# Patient Record
Sex: Male | Born: 1940
Health system: Southern US, Community
[De-identification: ages and names within clinical notes are randomized; demographics above are authoritative.]

## PROBLEM LIST (undated history)

## (undated) DIAGNOSIS — N3090 Cystitis, unspecified without hematuria: Secondary | ICD-10-CM

## (undated) DIAGNOSIS — N400 Enlarged prostate without lower urinary tract symptoms: Secondary | ICD-10-CM

## (undated) DIAGNOSIS — G459 Transient cerebral ischemic attack, unspecified: Secondary | ICD-10-CM

## (undated) DIAGNOSIS — D649 Anemia, unspecified: Secondary | ICD-10-CM

## (undated) DIAGNOSIS — R911 Solitary pulmonary nodule: Secondary | ICD-10-CM

## (undated) DIAGNOSIS — I255 Ischemic cardiomyopathy: Secondary | ICD-10-CM

## (undated) DIAGNOSIS — E785 Hyperlipidemia, unspecified: Secondary | ICD-10-CM

## (undated) DIAGNOSIS — I10 Essential (primary) hypertension: Secondary | ICD-10-CM

## (undated) DIAGNOSIS — E039 Hypothyroidism, unspecified: Secondary | ICD-10-CM

## (undated) DIAGNOSIS — E119 Type 2 diabetes mellitus without complications: Secondary | ICD-10-CM

## (undated) DIAGNOSIS — I2510 Atherosclerotic heart disease of native coronary artery without angina pectoris: Secondary | ICD-10-CM

## (undated) DIAGNOSIS — I447 Left bundle-branch block, unspecified: Secondary | ICD-10-CM

## (undated) DIAGNOSIS — I219 Acute myocardial infarction, unspecified: Secondary | ICD-10-CM

## (undated) DIAGNOSIS — I671 Cerebral aneurysm, nonruptured: Secondary | ICD-10-CM

## (undated) DIAGNOSIS — I480 Paroxysmal atrial fibrillation: Secondary | ICD-10-CM

## (undated) DIAGNOSIS — J189 Pneumonia, unspecified organism: Secondary | ICD-10-CM

## (undated) DIAGNOSIS — N183 Chronic kidney disease, stage 3 unspecified: Secondary | ICD-10-CM

## (undated) DIAGNOSIS — Z95810 Presence of automatic (implantable) cardiac defibrillator: Secondary | ICD-10-CM

## (undated) DIAGNOSIS — A4902 Methicillin resistant Staphylococcus aureus infection, unspecified site: Secondary | ICD-10-CM

## (undated) DIAGNOSIS — M1990 Unspecified osteoarthritis, unspecified site: Secondary | ICD-10-CM

## (undated) HISTORY — PX: CHOLECYSTECTOMY: SHX55

## (undated) HISTORY — DX: Morbid (severe) obesity due to excess calories: E66.01

## (undated) HISTORY — PX: LUNG SURGERY: SHX703

## (undated) HISTORY — DX: Solitary pulmonary nodule: R91.1

## (undated) HISTORY — DX: Chronic kidney disease, stage 3 (moderate): N18.3

## (undated) HISTORY — DX: Chronic kidney disease, stage 3 unspecified: N18.30

## (undated) HISTORY — DX: Acute myocardial infarction, unspecified: I21.9

## (undated) HISTORY — DX: Left bundle-branch block, unspecified: I44.7

## (undated) HISTORY — DX: Transient cerebral ischemic attack, unspecified: G45.9

## (undated) HISTORY — DX: Ischemic cardiomyopathy: I25.5

## (undated) HISTORY — DX: Hyperlipidemia, unspecified: E78.5

## (undated) HISTORY — DX: Type 2 diabetes mellitus without complications: E11.9

## (undated) HISTORY — PX: TOTAL KNEE ARTHROPLASTY: SHX125

## (undated) HISTORY — DX: Atherosclerotic heart disease of native coronary artery without angina pectoris: I25.10

## (undated) HISTORY — DX: Paroxysmal atrial fibrillation: I48.0

## (undated) HISTORY — DX: Benign prostatic hyperplasia without lower urinary tract symptoms: N40.0

## (undated) HISTORY — PX: VASECTOMY: SHX75

## (undated) HISTORY — DX: Essential (primary) hypertension: I10

---

## 2000-02-12 ENCOUNTER — Inpatient Hospital Stay (HOSPITAL_COMMUNITY): Admission: EM | Admit: 2000-02-12 | Discharge: 2000-02-19 | Payer: Self-pay | Admitting: Cardiology

## 2003-05-24 ENCOUNTER — Encounter: Payer: Self-pay | Admitting: Orthopedic Surgery

## 2003-05-28 ENCOUNTER — Inpatient Hospital Stay (HOSPITAL_COMMUNITY): Admission: RE | Admit: 2003-05-28 | Discharge: 2003-06-02 | Payer: Self-pay | Admitting: Orthopedic Surgery

## 2003-05-31 ENCOUNTER — Encounter: Payer: Self-pay | Admitting: Orthopedic Surgery

## 2004-01-30 ENCOUNTER — Inpatient Hospital Stay (HOSPITAL_COMMUNITY): Admission: RE | Admit: 2004-01-30 | Discharge: 2004-02-04 | Payer: Self-pay | Admitting: Orthopedic Surgery

## 2005-02-09 ENCOUNTER — Ambulatory Visit: Payer: Self-pay | Admitting: Cardiology

## 2006-04-06 ENCOUNTER — Ambulatory Visit: Payer: Self-pay | Admitting: Cardiology

## 2006-04-19 ENCOUNTER — Ambulatory Visit: Payer: Self-pay | Admitting: Cardiology

## 2007-12-02 ENCOUNTER — Ambulatory Visit: Payer: Self-pay | Admitting: Cardiology

## 2007-12-28 ENCOUNTER — Ambulatory Visit: Payer: Self-pay

## 2007-12-28 ENCOUNTER — Encounter: Payer: Self-pay | Admitting: Cardiology

## 2007-12-28 ENCOUNTER — Ambulatory Visit: Payer: Self-pay | Admitting: Cardiology

## 2007-12-28 LAB — CONVERTED CEMR LAB
Albumin: 3.8 g/dL (ref 3.5–5.2)
Basophils Absolute: 0.1 10*3/uL (ref 0.0–0.1)
Cholesterol: 192 mg/dL (ref 0–200)
Creatinine, Ser: 1.4 mg/dL (ref 0.4–1.5)
Direct LDL: 110 mg/dL
Eosinophils Absolute: 0.3 10*3/uL (ref 0.0–0.6)
GFR calc Af Amer: 65 mL/min
HCT: 36.6 % — ABNORMAL LOW (ref 39.0–52.0)
Hemoglobin: 12.6 g/dL — ABNORMAL LOW (ref 13.0–17.0)
Lymphocytes Relative: 18.2 % (ref 12.0–46.0)
MCHC: 34.4 g/dL (ref 30.0–36.0)
MCV: 91.9 fL (ref 78.0–100.0)
Monocytes Absolute: 0.8 10*3/uL — ABNORMAL HIGH (ref 0.2–0.7)
Neutro Abs: 4.6 10*3/uL (ref 1.4–7.7)
Neutrophils Relative %: 65.7 % (ref 43.0–77.0)
Potassium: 5.5 meq/L — ABNORMAL HIGH (ref 3.5–5.1)
RDW: 13.7 % (ref 11.5–14.6)
Sodium: 136 meq/L (ref 135–145)
TSH: 3.42 microintl units/mL (ref 0.35–5.50)
Total Bilirubin: 0.5 mg/dL (ref 0.3–1.2)
Total Protein: 6.5 g/dL (ref 6.0–8.3)

## 2008-01-03 ENCOUNTER — Ambulatory Visit: Payer: Self-pay | Admitting: Cardiology

## 2008-03-30 ENCOUNTER — Ambulatory Visit: Payer: Self-pay | Admitting: Cardiology

## 2008-05-14 ENCOUNTER — Ambulatory Visit: Payer: Self-pay | Admitting: Cardiology

## 2008-11-28 ENCOUNTER — Encounter: Payer: Self-pay | Admitting: Cardiology

## 2009-02-27 ENCOUNTER — Encounter: Payer: Self-pay | Admitting: Cardiology

## 2009-03-01 ENCOUNTER — Ambulatory Visit: Payer: Self-pay | Admitting: Cardiology

## 2009-03-12 ENCOUNTER — Encounter: Payer: Self-pay | Admitting: Cardiology

## 2009-03-12 ENCOUNTER — Ambulatory Visit: Payer: Self-pay | Admitting: Cardiology

## 2009-03-25 ENCOUNTER — Encounter: Payer: Self-pay | Admitting: Physician Assistant

## 2009-04-01 ENCOUNTER — Ambulatory Visit: Payer: Self-pay | Admitting: Cardiology

## 2009-04-03 ENCOUNTER — Ambulatory Visit: Payer: Self-pay | Admitting: Cardiology

## 2009-04-03 ENCOUNTER — Encounter: Payer: Self-pay | Admitting: Physician Assistant

## 2009-04-09 ENCOUNTER — Ambulatory Visit: Payer: Self-pay | Admitting: Cardiovascular Disease

## 2009-04-09 ENCOUNTER — Inpatient Hospital Stay (HOSPITAL_COMMUNITY): Admission: AD | Admit: 2009-04-09 | Discharge: 2009-04-10 | Payer: Self-pay | Admitting: Cardiovascular Disease

## 2009-04-10 ENCOUNTER — Encounter: Payer: Self-pay | Admitting: Cardiology

## 2009-04-18 ENCOUNTER — Encounter: Payer: Self-pay | Admitting: Cardiology

## 2009-05-06 ENCOUNTER — Encounter: Payer: Self-pay | Admitting: Physician Assistant

## 2009-05-06 ENCOUNTER — Ambulatory Visit: Payer: Self-pay | Admitting: Cardiology

## 2009-05-27 ENCOUNTER — Encounter: Payer: Self-pay | Admitting: Physician Assistant

## 2009-06-20 ENCOUNTER — Telehealth: Payer: Self-pay | Admitting: Physician Assistant

## 2009-06-24 ENCOUNTER — Telehealth: Payer: Self-pay | Admitting: Physician Assistant

## 2009-08-07 ENCOUNTER — Encounter: Admission: RE | Admit: 2009-08-07 | Discharge: 2009-08-07 | Payer: Self-pay | Admitting: Sports Medicine

## 2009-08-28 DIAGNOSIS — I5022 Chronic systolic (congestive) heart failure: Secondary | ICD-10-CM

## 2009-08-28 DIAGNOSIS — E663 Overweight: Secondary | ICD-10-CM | POA: Insufficient documentation

## 2009-08-28 DIAGNOSIS — E785 Hyperlipidemia, unspecified: Secondary | ICD-10-CM

## 2009-08-28 DIAGNOSIS — I2510 Atherosclerotic heart disease of native coronary artery without angina pectoris: Secondary | ICD-10-CM | POA: Insufficient documentation

## 2009-08-28 DIAGNOSIS — E875 Hyperkalemia: Secondary | ICD-10-CM | POA: Insufficient documentation

## 2009-08-28 DIAGNOSIS — I447 Left bundle-branch block, unspecified: Secondary | ICD-10-CM

## 2010-02-10 ENCOUNTER — Telehealth (INDEPENDENT_AMBULATORY_CARE_PROVIDER_SITE_OTHER): Payer: Self-pay | Admitting: *Deleted

## 2010-02-25 ENCOUNTER — Ambulatory Visit: Payer: Self-pay | Admitting: Cardiology

## 2010-03-04 ENCOUNTER — Encounter (INDEPENDENT_AMBULATORY_CARE_PROVIDER_SITE_OTHER): Payer: Self-pay | Admitting: *Deleted

## 2010-03-05 ENCOUNTER — Encounter: Admission: RE | Admit: 2010-03-05 | Discharge: 2010-03-05 | Payer: Self-pay | Admitting: Neurology

## 2010-03-11 ENCOUNTER — Encounter: Payer: Self-pay | Admitting: Cardiology

## 2010-03-11 ENCOUNTER — Ambulatory Visit: Payer: Self-pay | Admitting: Cardiology

## 2010-04-25 ENCOUNTER — Ambulatory Visit: Payer: Self-pay | Admitting: Cardiology

## 2010-04-25 ENCOUNTER — Encounter (INDEPENDENT_AMBULATORY_CARE_PROVIDER_SITE_OTHER): Payer: Self-pay | Admitting: *Deleted

## 2010-04-25 DIAGNOSIS — I119 Hypertensive heart disease without heart failure: Secondary | ICD-10-CM | POA: Insufficient documentation

## 2010-04-29 ENCOUNTER — Ambulatory Visit: Payer: Self-pay | Admitting: Cardiology

## 2010-04-30 ENCOUNTER — Encounter: Payer: Self-pay | Admitting: Cardiology

## 2010-05-15 ENCOUNTER — Encounter (INDEPENDENT_AMBULATORY_CARE_PROVIDER_SITE_OTHER): Payer: Self-pay | Admitting: *Deleted

## 2010-05-15 ENCOUNTER — Ambulatory Visit: Payer: Self-pay | Admitting: Physician Assistant

## 2010-05-15 DIAGNOSIS — E119 Type 2 diabetes mellitus without complications: Secondary | ICD-10-CM | POA: Insufficient documentation

## 2010-05-16 ENCOUNTER — Encounter (INDEPENDENT_AMBULATORY_CARE_PROVIDER_SITE_OTHER): Payer: Self-pay | Admitting: *Deleted

## 2010-05-20 ENCOUNTER — Encounter: Payer: Self-pay | Admitting: Physician Assistant

## 2010-05-21 ENCOUNTER — Inpatient Hospital Stay (HOSPITAL_BASED_OUTPATIENT_CLINIC_OR_DEPARTMENT_OTHER): Admission: RE | Admit: 2010-05-21 | Discharge: 2010-05-21 | Payer: Self-pay | Admitting: Cardiovascular Disease

## 2010-05-21 ENCOUNTER — Ambulatory Visit: Payer: Self-pay | Admitting: Cardiovascular Disease

## 2010-05-28 ENCOUNTER — Inpatient Hospital Stay (HOSPITAL_COMMUNITY): Admission: RE | Admit: 2010-05-28 | Discharge: 2010-05-29 | Payer: Self-pay | Admitting: Cardiovascular Disease

## 2010-05-28 ENCOUNTER — Ambulatory Visit: Payer: Self-pay | Admitting: Cardiovascular Disease

## 2010-06-06 ENCOUNTER — Encounter: Payer: Self-pay | Admitting: Physician Assistant

## 2010-06-06 ENCOUNTER — Ambulatory Visit: Payer: Self-pay | Admitting: Cardiology

## 2010-09-09 ENCOUNTER — Encounter: Admission: RE | Admit: 2010-09-09 | Discharge: 2010-09-09 | Payer: Self-pay | Admitting: Neurology

## 2010-12-18 NOTE — Assessment & Plan Note (Signed)
Summary: 3 mo fu per sept reminder-srs   Visit Type:  Follow-up Primary Provider:  Dr. [name]   History of Present Illness: 70 year old male presents for a followup visit. He denies any significant angina, and states that he does some dancing on a regular basis without major functional limitation. He reports NYHA class II dyspnea on exertion.  He indicates that his blood pressures typically better controlled than noted today. He states recent systolics have been in the 130 range. He reports compliance with medications. He has had no palpitations or syncope.  Current Medications (verified): 1)  Coreg 6.25 Mg Tabs (Carvedilol) .... Take 1 Tablet By Mouth Two Times A Day (Place On File) 2)  Felodipine 5 Mg Xr24h-Tab (Felodipine) .... Take 1 Tablet By Mouth Once A Day 3)  Nitrostat 0.4 Mg Subl (Nitroglycerin) .... Place One Tablet Under Tongue Every 5  Minutes As Needed For Severe Chest Pain, Not To Exceed 3 in 15 Min Time Frame. 4)  Plavix 75 Mg Tabs (Clopidogrel Bisulfate) .... Take 1 Tablet By Mouth Once A Day 5)  Aspir-Trin 325 Mg Tbec (Aspirin) .... Take 1 Tablet By Mouth Once A Day 6)  Flomax 0.4 Mg Caps (Tamsulosin Hcl) .... Take 1 Tablet By Mouth Once Every Other  Day 7)  Metformin Hcl 1000 Mg Tabs (Metformin Hcl) .... Take 1 Tablet By Mouth Two Times A Day 8)  Glyburide 5 Mg Tabs (Glyburide) .... Take 1 Tablet By Mouth Two Times A Day 9)  Tylenol Arthritis Pain 650 Mg Cr-Tabs (Acetaminophen) .... Take 2 Tablet By Mouth Two Times A Day 10)  Vitamin B-6 50 Mg Tabs (Pyridoxine Hcl) .... Take 1 Tablet By Mouth Once A Day  Allergies (verified): 1)  ! Codeine  Comments:  Nurse/Medical Assistant: The patient's medications and allergies were reviewed with the patient and were updated in the Medication and Allergy Lists. List reviewed.   Past History:  Past Medical History: Last updated: 02/24/2010 LBBB Mild renal insufficiency Type 2 diabetes mellitus CAD - DES RCA 5/10,  otherwise nonobstructive Hyperlipidemia TIA Ischemic myopathy Morbid obesity Mild mitral regurgitation Benign prostatic hypertrophy Paroxysmal atrial fibrillation  Social History: Last updated: 02/24/2010 Married  Tobacco Use - No Alcohol Use - no  Clinical Review Panels:  Echocardiogram Echocardiogram Conclusions:         1. The EF is 25%. There is dyssynergy of the septum. There is severe         hypokinesis of the inferior and posterior and anterior walls and the         apex. There is some motion of the lateral wall.         2. Moderate concentric left ventricular hypertrophy is observed.         3. The right ventricular global systolic function is normal.         4. There is mild mitral regurgitation.          5. The right ventricular systolic pressure is calculated at 15 mmHg.  (03/12/2009)  Cardiac Imaging Cardiac Cath Findings Severe segmental LV dysfunction with estimated LVEF 25-30%. There is severe hypokinesis of the anterolateral, apical, and inferior walls.  Right coronary dominance.  Complex calcified 95% mid lesion in RCA, stent placement with subsequent stenosis to 20% post procedure. Calcified 40% proximal lesion and LAD. Diffuse luminal irregularities to 30% in the mid circumflex. Calcified 80% mid lesion and right posterior descending branch. (04/09/2009)    Review of Systems  The patient denies anorexia,  weight gain, chest pain, syncope, peripheral edema, headaches, abdominal pain, melena, and hematochezia.         Otherwise reviewed and negative.  Vital Signs:  Patient profile:   70 year old male Height:      68 inches Weight:      228 pounds BMI:     34.79 Pulse rate:   65 / minute BP sitting:   153 / 77  (left arm) Cuff size:   large  Vitals Entered By: Carlye Grippe (February 25, 2010 3:02 PM)  Nutrition Counseling: Patient's BMI is greater than 25 and therefore counseled on weight management options.  Physical Exam  Additional Exam:   omale in no acute distress. HEENT: Conjunctiva and lids normal, oropharynx with moist mucosa. Neck: Supple, no elevated JVP. Lungs: Clear to auscultation, nonlabored. Cardiac: Regular rate and rhythm, paradoxically split S2, no S3. Abdomen: Obese, unable to palpate liver edge, bowel sounds present. Skin: Warm and dry, scattered abrasions and ecchymoses. Musculoskeletal: No kyphosis. Neuropsychiatric: Alert and oriented x3, affect appropriate.   EKG  Procedure date:  02/25/2010  Findings:      Normal sinus rhythm at 64 beats per minute with left bundle branch block.  Impression & Recommendations:  Problem # 1:  CAD, NATIVE VESSEL (ICD-414.01)  Symptomatically stable status post drug-eluting stent placement to the right coronary artery in May 2010. Otherwise nonobstructive disease noted. Patient continues on dual antiplatelet therapy.  His updated medication list for this problem includes:    Coreg 6.25 Mg Tabs (Carvedilol) .Marland Kitchen... Take 1 tablet by mouth two times a day (place on file)    Felodipine 5 Mg Xr24h-tab (Felodipine) .Marland Kitchen... Take 1 tablet by mouth once a day    Nitrostat 0.4 Mg Subl (Nitroglycerin) .Marland Kitchen... Place one tablet under tongue every 5  minutes as needed for severe chest pain, not to exceed 3 in 15 min time frame.    Plavix 75 Mg Tabs (Clopidogrel bisulfate) .Marland Kitchen... Take 1 tablet by mouth once a day    Aspir-trin 325 Mg Tbec (Aspirin) .Marland Kitchen... Take 1 tablet by mouth once a day  Orders: EKG w/ Interpretation (93000) 2-D Echocardiogram (2D Echo)  Problem # 2:  LBBB (ICD-426.3)  Chronic.  His updated medication list for this problem includes:    Coreg 6.25 Mg Tabs (Carvedilol) .Marland Kitchen... Take 1 tablet by mouth two times a day (place on file)    Felodipine 5 Mg Xr24h-tab (Felodipine) .Marland Kitchen... Take 1 tablet by mouth once a day    Nitrostat 0.4 Mg Subl (Nitroglycerin) .Marland Kitchen... Place one tablet under tongue every 5  minutes as needed for severe chest pain, not to exceed 3 in 15 min time  frame.    Plavix 75 Mg Tabs (Clopidogrel bisulfate) .Marland Kitchen... Take 1 tablet by mouth once a day    Aspir-trin 325 Mg Tbec (Aspirin) .Marland Kitchen... Take 1 tablet by mouth once a day  Problem # 3:  UNSPECIFIED SECONDARY CARDIOMYOPATHY (ICD-425.9)  Suspect mixed cardiomyopathy. Seems out of proportion to degree of coronary artery disease. He does have occasional lower extremity edema, and p.r.n. Lasix was prescribed. A followup echocardiogram will also be obtained to reassess systolic function.  His updated medication list for this problem includes:    Coreg 6.25 Mg Tabs (Carvedilol) .Marland Kitchen... Take 1 tablet by mouth two times a day (place on file)    Felodipine 5 Mg Xr24h-tab (Felodipine) .Marland Kitchen... Take 1 tablet by mouth once a day    Nitrostat 0.4 Mg Subl (Nitroglycerin) .Marland Kitchen... Place one tablet  under tongue every 5  minutes as needed for severe chest pain, not to exceed 3 in 15 min time frame.    Plavix 75 Mg Tabs (Clopidogrel bisulfate) .Marland Kitchen... Take 1 tablet by mouth once a day    Aspir-trin 325 Mg Tbec (Aspirin) .Marland Kitchen... Take 1 tablet by mouth once a day  Patient Instructions: 1)  Your physician has requested that you have an echocardiogram.  Echocardiography is a painless test that uses sound waves to create images of your heart. It provides your doctor with information about the size and shape of your heart and how well your heart's chambers and valves are working.  This procedure takes approximately one hour. There are no restrictions for this procedure. 2)  Your physician wants you to follow-up in: 3 months. You will receive a reminder letter in the mail one-two months in advance. If you don't receive a letter, please call our office to schedule the follow-up appointment. 3)  Take Lasix (furosemide) 20mg  by mouth as needed for swelling. Prescriptions: FUROSEMIDE 20 MG TABS (FUROSEMIDE) Take one tablet by mouth as needed  #30 x 2   Entered by:   Cyril Loosen, RN, BSN   Authorized by:   Loreli Slot, MD,  [facility]   Signed by:   Cyril Loosen, RN, BSN on 02/25/2010   Method used:   Electronically to        [facility]* (retail)       [address]       [place], [place]  [address]       Ph: [phone]       Fax: [phone]   RxID:   [id number]   Handout requested.

## 2010-12-18 NOTE — Assessment & Plan Note (Signed)
Summary: POST CATH & 1 MO FU   Visit Type:  Follow-up Primary Provider:  Dr. [name]   History of Present Illness: patient presents for post hospital followup.  Patient underwent recent elective diagnostic coronary angiography, for evaluation of persistent exertional dyspnea and a recent nuclear imaging study notable for a large fixed defect, but with no definite ischemia; EF 38%.  He was found to have severe in-stent restenosis in the RCA, and underwent successful balloon angioplasty, only. Dr. Clifton James was not able to expand the stent, secondary to severe calcification, and proceeded to reduce 2 areas of stenosis to approximately 40%. He also performed FFR of the proximal LAD, yielding moderate disease. Of note, ejection fraction estimated at 25%.  Clinically, the patient states that he returned to dancing the day after discharge. He does not report any associated chest discomfort. He seems to feel "a little better". He is tolerating his medications.  Preventive Screening-Counseling & Management  Alcohol-Tobacco     Smoking Status: quit     Year Quit: 1966  Current Medications (verified): 1)  Coreg 6.25 Mg Tabs (Carvedilol) .... Take 1 Tablet By Mouth Two Times A Day (Place On File) 2)  Felodipine 10 Mg Xr24h-Tab (Felodipine) .... Take 1 Tablet By Mouth Once A Day 3)  Nitrostat 0.4 Mg Subl (Nitroglycerin) .... Place One Tablet Under Tongue Every 5  Minutes As Needed For Severe Chest Pain, Not To Exceed 3 in 15 Min Time Frame. 4)  Plavix 75 Mg Tabs (Clopidogrel Bisulfate) .... Take 1 Tablet By Mouth Once A Day 5)  Aspir-Trin 325 Mg Tbec (Aspirin) .... Take 1 Tablet By Mouth Once A Day 6)  Metformin Hcl 1000 Mg Tabs (Metformin Hcl) .... Take 1 Tablet By Mouth Two Times A Day 7)  Glyburide 5 Mg Tabs (Glyburide) .... Take 1 Tablet By Mouth Two Times A Day 8)  Tylenol Arthritis Pain 650 Mg Cr-Tabs (Acetaminophen) .... Take 2 Tablet By Mouth Two Times A Day 9)  Vitamin B-6 50 Mg Tabs  (Pyridoxine Hcl) .... Take 1 Tablet By Mouth Once A Day 10)  Furosemide 20 Mg Tabs (Furosemide) .... Take One Tablet By Mouth As Needed 11)  Lisinopril 20 Mg Tabs (Lisinopril) .... Take 1 Tablet By Mouth Two Times A Day 12)  Neurpath-B 3-35-2 Mg Tabs (L-Methylfolate-B6-B12) .... Take 1 Tablet By Mouth Two Times A Day  Allergies (verified): 1)  ! Codeine  Comments:  Nurse/Medical Assistant: The patient's medication list and allergies were reviewed with the patient and were updated in the Medication and Allergy Lists.  Past History:  Past Medical History: LBBB Mild renal insufficiency Type 2 diabetes mellitus CAD - DES RCA 5/10, otherwise nonobstructive... PTCA RCA (ISR), 7/11 Hyperlipidemia TIA Ischemic myopathy... 25% by catheterization, 7/11; (35% by echo, 4/11) Morbid obesity Mild mitral regurgitation Benign prostatic hypertrophy Paroxysmal atrial fibrillation  Vital Signs:  Patient profile:   70 year old male Height:      68 inches Weight:      226 pounds Pulse rate:   67 / minute BP sitting:   142 / 74  (left arm) Cuff size:   large  Vitals Entered By: Carlye Grippe (June 06, 2010 11:02 AM)  Serial Vital Signs/Assessments:  Time      Position  BP       Pulse  Resp  Temp     By 11:09 AM            128/77   59  Carlye Grippe   Physical Exam  Additional Exam:  GEN: 70 yo male, sitting upright, in no distress HEENT: NCAT,PERRLA,EOMI NECK: palpable pulses, no bruits; no JVD; no TM LUNGS: CTA bilaterally HEART: RRR (S1S2); no significant murmurs; no rubs; no gallops ABD: soft, NT; intact BS EXT: intact femoral pulses, no bruits; minimally palpable DPs; trace edema SKIN: warm, dry MUSC: no obvious deformity NEURO: A/O (x3)     EKG  Procedure date:  06/06/2010  Findings:      sinus bradycardia at 54 bpm; chronic LBBB  Impression & Recommendations:  Problem # 1:  CAD, NATIVE VESSEL (ICD-414.01)  patient underwent recent successful  PTCA of severe in-stent restenosis of the RCA. He has moderate residual LAD disease. Estimated EF 25% by catheterization. He is not complaining of exertional symptoms, and seems to suggest some modest improvement since the procedure. We'll continue to monitor closely on current medication regimen, and schedule return visit in 3 months with Dr. Diona Browner. Of note, we'll schedule a followup 2-D echo prior to that visit, for reassessment of the LVF. An echo in April of this year suggested EF 35%. If his ejection fraction remains low, we many need to consider evaluation for ICD implantation.  Problem # 2:  HYPERLIPIDEMIA-MIXED (ICD-272.4)  patient is currently not on a statin. He reports prior intolerance to Lipitor and, most recently, Crestor. He does not know what the dose was, however. He has agreed to be rechallenged with Crestor, at a reduced dose of 5 mg daily. Will monitor closely and check followup lipid profile in 12 weeks. He will need aggressive management with target LDL of 70 or less, if feasible.  Problem # 3:  DM (ICD-250.00) Assessment: Comment Only  Problem # 4:  SYSTOLIC HEART FAILURE, CHRONIC (ICD-428.22)  euvolemic by history and physical examination.  Problem # 5:  LBBB (ICD-426.3) Assessment: Comment Only  Other Orders: EKG w/ Interpretation (93000)  Patient Instructions: 1)  Start Crestor 6mg  by mouth once at bedtime. 2)  Your physician recommends that you go to the [facility] for a FASTING lipid profile and liver function labs:  DO IN 12 WEEKS BEFORE OFFICE VISIT. 3)  Your physician has requested that you have an echocardiogram.  Echocardiography is a painless test that uses sound waves to create images of your heart. It provides your doctor with information about the size and shape of your heart and how well your heart's chambers and valves are working.  This procedure takes approximately one hour. There are no restrictions for this procedure. DO IN 18 WEEKS BEFORE OFFICE  VISIT.  4)  Your physician wants you to follow-up in: 3 months. You will receive a reminder letter in the mail one-two months in advance. If you don't receive a letter, please call our office to schedule the follow-up appointment. Prescriptions: CRESTOR 5 MG TABS (ROSUVASTATIN CALCIUM) Take one tablet by mouth at bedtime  #30 x 6   Entered by:   Cyril Loosen, RN, BSN   Authorized by:   Loreli Slot, MD, [facility]   Signed by:   Cyril Loosen, RN, BSN on 06/06/2010   Method used:   Electronically to        [facility]* (retail)       [address]       [place], [place]  [address]       Ph: [phone]       Fax: [phone]   RxID:  [id number]   Handout requested.

## 2010-12-18 NOTE — Assessment & Plan Note (Signed)
Summary: 3 WK F/U PER 6/10 OV W/SM-JM   Visit Type:  Follow-up Primary Provider:  Dr. [name]   History of Present Illness: The patient returns for scheduled followup.  He was recently referred for a nuclear imaging study, for evaluation of exertional chest discomfort and dyspnea. This revealed a large, fixed inferior defect with severe HK, but no definite ischemia: EF 38%.  Medications adjusted with increase in Felodipine to 10 mg daily, for better HTN control.  The patient reports no significant improvement in his symptoms. He continues to experience significant DOE, and states he has never had any chest pain.  Preventive Screening-Counseling & Management  Alcohol-Tobacco     Smoking Status: quit     Year Quit: 1966  Current Medications (verified): 1)  Coreg 6.25 Mg Tabs (Carvedilol) .... Take 1 Tablet By Mouth Two Times A Day (Place On File) 2)  Felodipine 10 Mg Xr24h-Tab (Felodipine) .... Take 1 Tablet By Mouth Once A Day 3)  Nitrostat 0.4 Mg Subl (Nitroglycerin) .... Place One Tablet Under Tongue Every 5  Minutes As Needed For Severe Chest Pain, Not To Exceed 3 in 15 Min Time Frame. 4)  Plavix 75 Mg Tabs (Clopidogrel Bisulfate) .... Take 1 Tablet By Mouth Once A Day 5)  Aspir-Trin 325 Mg Tbec (Aspirin) .... Take 1 Tablet By Mouth Once A Day 6)  Metformin Hcl 1000 Mg Tabs (Metformin Hcl) .... Take 1 Tablet By Mouth Two Times A Day 7)  Glyburide 5 Mg Tabs (Glyburide) .... Take 1 Tablet By Mouth Two Times A Day 8)  Tylenol Arthritis Pain 650 Mg Cr-Tabs (Acetaminophen) .... Take 2 Tablet By Mouth Two Times A Day 9)  Vitamin B-6 50 Mg Tabs (Pyridoxine Hcl) .... Take 1 Tablet By Mouth Once A Day 10)  Furosemide 20 Mg Tabs (Furosemide) .... Take One Tablet By Mouth As Needed 11)  Lisinopril 20 Mg Tabs (Lisinopril) .... Take 1 Tablet By Mouth Two Times A Day 12)  Neurpath-B 3-35-2 Mg Tabs (L-Methylfolate-B6-B12) .... Take 1 Tablet By Mouth Two Times A Day  Allergies  (verified): 1)  ! Codeine  Comments:  Nurse/Medical Assistant: The patient's medication list and allergies were reviewed with the patient and were updated in the Medication and Allergy Lists.  Past History:  Past Medical History: Last updated: 02/24/2010 LBBB Mild renal insufficiency Type 2 diabetes mellitus CAD - DES RCA 5/10, otherwise nonobstructive Hyperlipidemia TIA Ischemic myopathy Morbid obesity Mild mitral regurgitation Benign prostatic hypertrophy Paroxysmal atrial fibrillation  Past Surgical History: Last updated: 02/24/2010 Cholecystectomy Knee Arthroplasty-Total Two lung surgeries Vasectomy  Family History: Last updated: 02/24/2010 Premature cardiovascular disease  Social History: Last updated: 02/24/2010 Married  Tobacco Use - No Alcohol Use - no  Review of Systems       No fevers, chills, hemoptysis, dysphagia, melena, hematocheezia, hematuria, rash, claudication, orthopnea, pnd, pedal edema. no tachypalpitations. All other systems negative.   Vital Signs:  Patient profile:   70 year old male Height:      68 inches Weight:      226 pounds Pulse rate:   65 / minute BP sitting:   151 / 73  (left arm) Cuff size:   large  Vitals Entered By: Carlye Grippe (May 15, 2010 2:31 PM)  Physical Exam  Additional Exam:  GEN: 70 yo male, sitting upright, in no distress HEENT: NCAT,PERRLA,EOMI NECK: palpable pulses, no bruits; no JVD; no TM LUNGS: CTA bilaterally HEART: RRR (S1S2); no significant murmurs; no rubs; no gallops ABD: soft,  NT; intact BS EXT: intact femoral pulses, no bruits; minimally palpable DPs; trace edema SKIN: warm, dry MUSC: no obvious deformity NEURO: A/O (x3)     Impression & Recommendations:  Problem # 1:  CAD, NATIVE VESSEL (ICD-414.01)  Patient continues to experience significant DOE, with some associated chest tightness. He reports no appreciable improvement, following recent increase in Felodipine dose. Despite  absence of definite ischemia on recent Cardiolite, he is concerned that he may have a new "blockage". Therefore, we recommend proceeding with a diagnostic cardiac cath in the [facility] lab. The patient is in agreement, and the risks/benefits were reviewed, in conjunction with Dr Diona Browner. Will hold Metformin AM of cath.  Problem # 2:  ESSENTIAL HYPERTENSION, BENIGN (ICD-401.1)  Improved, following recent increase in Felodipine.  Problem # 3:  SYSTOLIC HEART FAILURE, CHRONIC (ICD-428.22)  Patient appears euvolemic; continue as needed Lasix.  Problem # 4:  HYPERLIPIDEMIA-MIXED (ICD-272.4) Assessment: Comment Only  Problem # 5:  LBBB (ICD-426.3)  chronic  Problem # 6:  DM (ICD-250.00)  will hold Metformin AM of cath.  Other Orders: Cardiac Catheterization (Cardiac Cath) T-Chest x-ray, 2 views (14782) T-Basic Metabolic Panel ([id number]) T-CBC No Diff ([id number]) T-PTT ([id number]) T-Protime, Auto ([id number])  Patient Instructions: 1)  Your physician recommends that you go to the [facility] for lab work and chest x-ray. Please do today, if possible. 2)  Your physician has requested that you have a cardiac catheterization.  Cardiac catheterization is used to diagnose and/or treat various heart conditions. Doctors may recommend this procedure for a number of different reasons. The most common reason is to evaluate chest pain. Chest pain can be a symptom of coronary artery disease (CAD), and cardiac catheterization can show whether plaque is narrowing or blocking your heart's arteries. This procedure is also used to evaluate the valves, as well as measure the blood flow and oxygen levels in different parts of your heart.  For further information please visit [email or web address]  Please follow instruction sheet, as given. Prescriptions: NITROSTAT 0.4 MG SUBL (NITROGLYCERIN) place one tablet under tongue every 5  minutes as needed for severe chest pain, not to exceed 3 in 15 min time  frame.  #25 x 2   Entered by:   Cyril Loosen, RN, BSN   Authorized by:   Nelida Meuse, PA-C   Signed by:   Cyril Loosen, RN, BSN on 05/15/2010   Method used:   Electronically to        [facility]* (retail)       [address]       [place], [place]  [address]       Ph: [phone]       Fax: [phone]   RxID:   [id number]

## 2010-12-18 NOTE — Letter (Signed)
Summary: Lexiscan or Dobutamine Pharmacist, community at [facility]  [address]   [place], [place] [address]   Phone: [phone]  Fax: [phone]      [facility] Cardiovascular Services  Lexiscan or Dobutamine Cardiolite Strss Test    [place]  Appointment Date:_  Appointment Time:_  Your doctor has ordered a CARDIOLITE STRESS TEST using a medication to stimulate exercise so that you will not have to walk on the treadmill to determine the condition of your heart during stress. If you take blood pressure medication, ask your doctor if you should take it the day of your test. You should not have anything to eat or drink at least 4 hours before your test is scheduled, and no caffeine, including decaffeinated tea and coffee, chocolate, and soft drinks for 24 hours before your test.  You will need to register at the Outpatient/Main Entrance at the hospital 15 minutes before your appointment time. It is a good idea to bring a copy of your order with you. They will direct you to the Diagnostic Imaging (Radiology) Department.  You will be asked to undress from the waist up and given a hospital gown to wear, so dress comfortably from the waist down for example: Sweat pants, shorts, or skirt Rubber soled lace up shoes (tennis shoes)  Plan on about three hours from registration to release from the hospital   You may take all of your medications with water the morning of your test.

## 2010-12-18 NOTE — Letter (Signed)
Summary: External Correspondence/ FAXED PRE-CATH ORDER  External Correspondence/ FAXED PRE-CATH ORDER   Imported By: Dorise Hiss 05/21/2010 14:04:11  _____________________________________________________________________  External Attachment:    Type:   Image     Comment:   External Document

## 2010-12-18 NOTE — Miscellaneous (Signed)
Summary: Orders Update  Clinical Lists Changes  Orders: Added new Test order of T-Basic Metabolic Panel [id number]) - Signed  Appended Document: Orders Update    Clinical Lists Changes  Orders: Added new Test order of T-CBC No Diff ([id number]) - Signed

## 2010-12-18 NOTE — Letter (Signed)
Summary: Generic Engineer, agricultural at [facility] [address]   [place], [place] [address]   Phone: [phone]  Fax: [phone]    03/04/2010  [name] [address] [place], [place]  [address]  Dear Mr. Goodloe,   Enclosed is a copy of your re-scheduled order for the 2 D Echo. I tried calling your home phone but the voice mail box was full. If this date and time are not suitable with you please give me a call and I will be happy to re-schedule.          Sincerely,   Zachary George Patient Care Coordinator [phone] ext. [phone]

## 2010-12-18 NOTE — Progress Notes (Signed)
Summary: PLEASE CALL/NEED CLARIFY COREG DOSE  Phone Note Call from Patient Call back at [phone]   Caller: Spouse Call For: nurse Summary of Call: wife called to get clarification on dose for carvedilol. Nurse informed her that from last visit that coreg 3.125mg  was increased to 6.25mg  two times a day.  Initial call taken by: Carlye Grippe,  February 10, 2010 9:46 AM

## 2010-12-18 NOTE — Letter (Signed)
Summary: Cardiac Cath Instructions - [facility]  [facility] [address]   [place], [place] [address]   Phone: [phone]  Fax: [phone]     05/15/2010 MRN: [id number]  MAKIH STEFANKO [address] [place], [place]  [address]  Dear Mr. Gerhart,   You are scheduled for a Cardiac Catheterization on Wednesday, May 21, 2010 with Dr. Clifton James  Please arrive to the 1st floor of the Heart and Vascular Center at [facility] at 9:30am on the day of your procedure. Please do not arrive before 6:30 a.m. Call the Heart and Vascular Center at [phone] if you are unable to make your appointmnet. The Code to get into the parking garage under the building is 1000. Take the elevators to the 1st floor. You must have someone to drive you home. Someone must be with you for the first 24 hours after you arrive home. Please wear clothes that are easy to get on and off and wear slip-on shoes. Do not eat or drink after midnight except water with your medications that morning. Bring all your medications and current insurance cards with you.  _X__ DO NOT take these medications before your procedure: Hold Metformin 24 hours before and 48 hours after cath. Hold Glyburide, Lasix (furosemide) and Lisinopril am of cath.  _X__ Make sure you take your aspirin and Plavix.  ___ You may take ALL of your medications with water that morning.  ___ DO NOT take ANY medications before your procedure.  ___ Pre-med instructions:  ________________________________________________________________________  The usual length of stay after your procedure is 2 to 3 hours. This can vary.  If you have any questions, please call the office at the number listed above.  Cyril Loosen, RN, BSN            Directions to the [facility]  Please Note : Park in [place] under the building not the parking deck.  From [employer]: Turn onto [address] Left onto [address] (1st stoplight) Right at the brick entrance to the hospital ([address]) Bear to the right and you will see a blue sign "Heart and Vascular Center" Parking garage is a sharp right'to get through the gate out in the code __1000_____. Once you park, take the elevator to the first floor. Please do not arrive before 0630am. The building will be dark before that time.   From [address] Turn onto [address] Turn left into the brick entrance to the hospital ([address] circle [address]) Bear to the right and you will see a blue sign "[facility] Center" Parking garage is a sharp right, to get thru the gate put in the code _1000___. Once you park, take the elevator to the first floor. Please do not arrive before 0630am. The building will be dark before that time

## 2010-12-18 NOTE — Assessment & Plan Note (Signed)
Summary: ROV- PER PATIENT REQUEST   Visit Type:  Follow-up Primary Provider:  Dr. [name]   History of Present Illness: 70 year old [name] presents for followup. He called to schedule a visit related to increasing episodes of chest pain consistent with angina. He states that he enjoys dancing on a regular basis, although has been able to get through only one song, then having to rest related to a feeling of "cold air" in his upper chest. This resolves after a few minutes of rest. This is been progressive over the last few months. He has not used any nitroglycerin.  I reviewed his recent echocardiogram, outlined below. LVEF is abnormal, however has increased overall.  He reports compliance with his medications.  Preventive Screening-Counseling & Management  Alcohol-Tobacco     Smoking Status: quit     Year Quit: 1966  Current Medications (verified): 1)  Coreg 6.25 Mg Tabs (Carvedilol) .... Take 1 Tablet By Mouth Two Times A Day (Place On File) 2)  Felodipine 5 Mg Xr24h-Tab (Felodipine) .... Take 1 Tablet By Mouth Once A Day 3)  Nitrostat 0.4 Mg Subl (Nitroglycerin) .... Place One Tablet Under Tongue Every 5  Minutes As Needed For Severe Chest Pain, Not To Exceed 3 in 15 Min Time Frame. 4)  Plavix 75 Mg Tabs (Clopidogrel Bisulfate) .... Take 1 Tablet By Mouth Once A Day 5)  Aspir-Trin 325 Mg Tbec (Aspirin) .... Take 1 Tablet By Mouth Once A Day 6)  Flomax 0.4 Mg Caps (Tamsulosin Hcl) .... Take 1 Tablet By Mouth Once Every Other  Day 7)  Metformin Hcl 1000 Mg Tabs (Metformin Hcl) .... Take 1 Tablet By Mouth Two Times A Day 8)  Glyburide 5 Mg Tabs (Glyburide) .... Take 1 Tablet By Mouth Two Times A Day 9)  Tylenol Arthritis Pain 650 Mg Cr-Tabs (Acetaminophen) .... Take 2 Tablet By Mouth Two Times A Day 10)  Vitamin B-6 50 Mg Tabs (Pyridoxine Hcl) .... Take 1 Tablet By Mouth Once A Day 11)  Furosemide 20 Mg Tabs (Furosemide) .... Take One Tablet By Mouth As Needed  Allergies: 1)  !  Codeine  Comments:  Nurse/Medical Assistant: The patient's medications were reviewed with the patient and were updated in the Medication List.  Past History:  Past Medical History: Last updated: 02/24/2010 LBBB Mild renal insufficiency Type 2 diabetes mellitus CAD - DES RCA 5/10, otherwise nonobstructive Hyperlipidemia TIA Ischemic myopathy Morbid obesity Mild mitral regurgitation Benign prostatic hypertrophy Paroxysmal atrial fibrillation  Social History: Last updated: 02/24/2010 Married  Tobacco Use - No Alcohol Use - no  Social History: Smoking Status:  quit  Review of Systems       The patient complains of chest pain and dyspnea on exertion.  The patient denies anorexia, fever, weight loss, syncope, peripheral edema, prolonged cough, melena, and hematochezia.         Otherwise reviewed and negative except as outlined.  Vital Signs:  Patient profile:   70 year old [name] Height:      Tyler inches Weight:      224.4 pounds O2 Sat:      98 % Pulse rate:   71 / minute BP sitting:   172 / 74  (left arm)  Vitals Entered By: Youlanda Mighty RN (April 25, 2010 1:43 PM) Is Patient Diabetic? Yes Did you bring your meter with you today? No   Physical Exam  Additional Exam:  Obese [name] in no acute distress. HEENT: Conjunctiva and lids normal, oropharynx with moist mucosa.  Neck: Supple, no elevated JVP. Lungs: Clear to auscultation, nonlabored. Cardiac: Regular rate and rhythm, paradoxically split S2, no S3. Abdomen: Obese, unable to palpate liver edge, bowel sounds present. Skin: Warm and dry, scattered abrasions and ecchymoses. Musculoskeletal: No kyphosis. Neuropsychiatric: Alert and oriented x3, affect appropriate.   Echocardiogram  Procedure date:  03/11/2010  Findings:      Mild left ventricular dilatation with moderate LVH, LVEF 35%, aortic valve sclerosis.  Impression & Recommendations:  Problem # 1:  CAD, NATIVE VESSEL (ICD-414.01)  History of  drug-eluting stent to the right coronary artery last May, otherwise nonobstructive. Patient is describing symptoms consistent with progressive angina. Plan to continue medical regimen, with an increase in felodipine to 10 mg daily for additional antianginal benefit and blood pressure control, and refer him for a followup Lexiscan Cardiolite. May need to consider a repeat angiogram if residual ischemia is noted. Followup visit scheduled for the next 3 weeks.  His updated medication list for this problem includes:    Coreg 6.25 Mg Tabs (Carvedilol) .Marland Kitchen... Take 1 tablet by mouth two times a day (place on file)    Felodipine 10 Mg Xr24h-tab (Felodipine) .Marland Kitchen... Take 1 tablet by mouth once a day    Nitrostat 0.4 Mg Subl (Nitroglycerin) .Marland Kitchen... Place one tablet under tongue every 5  minutes as needed for severe chest pain, not to exceed 3 in 15 min time frame.    Plavix 75 Mg Tabs (Clopidogrel bisulfate) .Marland Kitchen... Take 1 tablet by mouth once a day    Aspir-trin 325 Mg Tbec (Aspirin) .Marland Kitchen... Take 1 tablet by mouth once a day  Problem # 2:  LBBB (ICD-426.3)  Chronic.  His updated medication list for this problem includes:    Coreg 6.25 Mg Tabs (Carvedilol) .Marland Kitchen... Take 1 tablet by mouth two times a day (place on file)    Felodipine 10 Mg Xr24h-tab (Felodipine) .Marland Kitchen... Take 1 tablet by mouth once a day    Nitrostat 0.4 Mg Subl (Nitroglycerin) .Marland Kitchen... Place one tablet under tongue every 5  minutes as needed for severe chest pain, not to exceed 3 in 15 min time frame.    Plavix 75 Mg Tabs (Clopidogrel bisulfate) .Marland Kitchen... Take 1 tablet by mouth once a day    Aspir-trin 325 Mg Tbec (Aspirin) .Marland Kitchen... Take 1 tablet by mouth once a day  Problem # 3:  SYSTOLIC HEART FAILURE, CHRONIC (ICD-428.22)  Recent echocardiogram shows improvement in LVEF to 35%.  His updated medication list for this problem includes:    Coreg 6.25 Mg Tabs (Carvedilol) .Marland Kitchen... Take 1 tablet by mouth two times a day (place on file)    Felodipine 10 Mg  Xr24h-tab (Felodipine) .Marland Kitchen... Take 1 tablet by mouth once a day    Nitrostat 0.4 Mg Subl (Nitroglycerin) .Marland Kitchen... Place one tablet under tongue every 5  minutes as needed for severe chest pain, not to exceed 3 in 15 min time frame.    Plavix 75 Mg Tabs (Clopidogrel bisulfate) .Marland Kitchen... Take 1 tablet by mouth once a day    Aspir-trin 325 Mg Tbec (Aspirin) .Marland Kitchen... Take 1 tablet by mouth once a day    Furosemide 20 Mg Tabs (Furosemide) .Marland Kitchen... Take one tablet by mouth as needed  Problem # 4:  ESSENTIAL HYPERTENSION, BENIGN (ICD-401.1)  Blood pressure elevated. Felodipine is being increased as noted above.  His updated medication list for this problem includes:    Coreg 6.25 Mg Tabs (Carvedilol) .Marland Kitchen... Take 1 tablet by mouth two times a day (place on  file)    Felodipine 10 Mg Xr24h-tab (Felodipine) .Marland Kitchen... Take 1 tablet by mouth once a day    Aspir-trin 325 Mg Tbec (Aspirin) .Marland Kitchen... Take 1 tablet by mouth once a day    Furosemide 20 Mg Tabs (Furosemide) .Marland Kitchen... Take one tablet by mouth as needed  Patient Instructions: 1)  Follow up appt with our PA on Thursday, May 15, 2010 at 2pm. 2)  Increase Felodipine to 10mg  by mouth once daily. You may take 2 of your 5 mg tablets until gone and then get new prescription filled for 10mg  tablets. 3)  Your physician has requested that you have an Best boy.  For further information please visit [email or web address]  Please follow instruction sheet, as given. Prescriptions: FELODIPINE 10 MG XR24H-TAB (FELODIPINE) Take 1 tablet by mouth once a day  #30 x 6   Entered by:   Cyril Loosen, RN, BSN   Authorized by:   Loreli Slot, MD, [facility]   Signed by:   Cyril Loosen, RN, BSN on 04/25/2010   Method used:   Electronically to        [facility]* (retail)       [address]       [place], [place]  [address]       Ph: [phone]       Fax: [phone]   RxID:   [id number]

## 2010-12-19 ENCOUNTER — Other Ambulatory Visit: Payer: Self-pay | Admitting: Neurology

## 2010-12-19 DIAGNOSIS — I671 Cerebral aneurysm, nonruptured: Secondary | ICD-10-CM

## 2011-02-01 LAB — BASIC METABOLIC PANEL
BUN: 14 mg/dL (ref 6–23)
BUN: 22 mg/dL (ref 6–23)
CO2: 24 mEq/L (ref 19–32)
Calcium: 8.6 mg/dL (ref 8.4–10.5)
Calcium: 8.9 mg/dL (ref 8.4–10.5)
Chloride: 104 mEq/L (ref 96–112)
Creatinine, Ser: 0.96 mg/dL (ref 0.4–1.5)
GFR calc Af Amer: >60 mL/min (ref >60)
GFR calc non Af Amer: 56 mL/min — ABNORMAL LOW (ref >60)
GFR calc non Af Amer: >60 mL/min (ref >60)
Glucose, Bld: 181 mg/dL — ABNORMAL HIGH (ref 70–99)
Glucose, Bld: 204 mg/dL — ABNORMAL HIGH (ref 70–99)
Potassium: 4.8 mEq/L (ref 3.5–5.1)
Sodium: 133 mEq/L — ABNORMAL LOW (ref 135–145)
Sodium: 135 mEq/L (ref 135–145)

## 2011-02-01 LAB — CBC
HCT: 27.1 % — ABNORMAL LOW (ref 39.0–52.0)
Hemoglobin: 9 g/dL — ABNORMAL LOW (ref 13.0–17.0)
Hemoglobin: 9.2 g/dL — ABNORMAL LOW (ref 13.0–17.0)
MCH: 26.6 pg (ref 26.0–34.0)
MCHC: 32.5 g/dL (ref 30.0–36.0)
MCHC: 33 g/dL (ref 30.0–36.0)
MCV: 80.6 fL (ref 78.0–100.0)
Platelets: 222 10*3/uL (ref 150–400)
RBC: 3.37 MIL/uL — ABNORMAL LOW (ref 4.22–5.81)
RBC: 3.49 MIL/uL — ABNORMAL LOW (ref 4.22–5.81)
RDW: 18 % — ABNORMAL HIGH (ref 11.5–15.5)
WBC: 8.4 10*3/uL (ref 4.0–10.5)

## 2011-02-01 LAB — PROTIME-INR
INR: 1.08 (ref 0.00–1.49)
Prothrombin Time: 13.9 seconds (ref 11.6–15.2)

## 2011-02-01 LAB — GLUCOSE, CAPILLARY
Glucose-Capillary: 132 mg/dL — ABNORMAL HIGH (ref 70–99)
Glucose-Capillary: 174 mg/dL — ABNORMAL HIGH (ref 70–99)
Glucose-Capillary: 195 mg/dL — ABNORMAL HIGH (ref 70–99)

## 2011-02-11 ENCOUNTER — Encounter: Payer: Self-pay | Admitting: *Deleted

## 2011-02-12 ENCOUNTER — Encounter: Payer: Self-pay | Admitting: Cardiology

## 2011-02-13 ENCOUNTER — Encounter: Payer: Self-pay | Admitting: Cardiology

## 2011-02-13 ENCOUNTER — Ambulatory Visit (INDEPENDENT_AMBULATORY_CARE_PROVIDER_SITE_OTHER): Payer: Medicare HMO | Admitting: Cardiology

## 2011-02-13 VITALS — BP 136/75 | HR 56 | Ht 68.0 in | Wt 222.0 lb

## 2011-02-13 DIAGNOSIS — I429 Cardiomyopathy, unspecified: Secondary | ICD-10-CM

## 2011-02-13 DIAGNOSIS — I10 Essential (primary) hypertension: Secondary | ICD-10-CM

## 2011-02-13 DIAGNOSIS — I2510 Atherosclerotic heart disease of native coronary artery without angina pectoris: Secondary | ICD-10-CM

## 2011-02-13 DIAGNOSIS — I447 Left bundle-branch block, unspecified: Secondary | ICD-10-CM

## 2011-02-13 DIAGNOSIS — E785 Hyperlipidemia, unspecified: Secondary | ICD-10-CM

## 2011-02-13 NOTE — Assessment & Plan Note (Signed)
Patient no longer on Crestor, described intolerance. Will continue to work with him.

## 2011-02-13 NOTE — Progress Notes (Signed)
Clinical Summary [name] is a 70 y.o.male presenting for routine followup. He was seen in July 2011 following coronary intervention with PTCA of an in-stent RCA stenosis. LVEF was in the range of 25-35% around that time, and a followup echocardiogram was suggested.  Symptomatically [name] states that he is feeling well, continues to dance regularly, no active angina or shortness of breath beyond NYHA class II. No palpitations or syncope.  He is now being followed by an endocrinologist in [place]. On insulin at this point.   Allergies  Allergen Reactions  . Codeine     REACTION: blisters    Current outpatient prescriptions:acetaminophen (TYLENOL) 650 MG CR tablet, Take 2 by mouth twice daily  , Disp: , Rfl: ;  aspirin 325 MG tablet, Take 325 mg by mouth daily.  , Disp: , Rfl: ;  carvedilol (COREG) 6.25 MG tablet, Take 6.25 mg by mouth 2 (two) times daily with a meal.  , Disp: , Rfl: ;  clopidogrel (PLAVIX) 75 MG tablet, Take 75 mg by mouth daily.  , Disp: , Rfl:  felodipine (PLENDIL) 10 MG 24 hr tablet, Take 10 mg by mouth daily.  , Disp: , Rfl: ;  ferrous sulfate 324 (65 FE) MG TBEC, Take by mouth daily.  , Disp: , Rfl: ;  furosemide (LASIX) 20 MG tablet, Take one by mouth daily  , Disp: , Rfl: ;  insulin glargine (LANTUS) 100 UNIT/ML injection, Inject into the skin at bedtime.  , Disp: , Rfl: ;  insulin regular (HUMULIN R,NOVOLIN R) 100 UNIT/ML injection, Inject into the skin as directed.  , Disp: , Rfl:  L-Methylfolate-B6-B12 (NEURPATH-B) 3-35-2 MG TABS, Take one by mouth twice daily  , Disp: , Rfl: ;  metFORMIN (GLUCOPHAGE) 1000 MG tablet, Take 500 mg by mouth daily with breakfast. , Disp: , Rfl: ;  nitroGLYCERIN (NITROSTAT) 0.4 MG SL tablet, Place 0.4 mg under the tongue every 5 (five) minutes as needed.  , Disp: , Rfl: ;  pyridoxine (B-6) 50 MG tablet, Take 50 mg by mouth daily.  , Disp: , Rfl:  Zinc 50 MG CAPS, Take by mouth daily.  , Disp: , Rfl: ;  DISCONTD: glyBURIDE (DIABETA) 5  MG tablet, Take one by mouth twice daily  , Disp: , Rfl: ;  DISCONTD: lisinopril (PRINIVIL,ZESTRIL) 20 MG tablet, Take one by mouth twice daily  , Disp: , Rfl: ;  DISCONTD: rosuvastatin (CRESTOR) 5 MG tablet, Take 5 mg by mouth daily.  , Disp: , Rfl:   Past Medical History  Diagnosis Date  . Mild renal insufficiency   . Type 2 diabetes mellitus   . Coronary atherosclerosis of native coronary artery     DES RCA 5/10, PTCA RCA (ISR), 7/11  . Hyperlipidemia   . TIA (transient ischemic attack)   . Cardiomyopathy, ischemic     LVEF 25-35%  . Morbid obesity   . Mitral regurgitation     Mild  . BPH (benign prostatic hypertrophy)   . LBBB (left bundle branch block)   . Paroxysmal atrial fibrillation     Social History [name] reports that he quit smoking about 46 years ago. His smoking use included Cigarettes. He has never used smokeless tobacco. [name] reports that he does not drink alcohol.  Review of Systems No fevers, chills, or unusual weight change. No chest pain, progressive shortness of breath, cough, hemoptysis, or wheezing. No palpitations, dizziness, or syncope. No dysphasia or odynophagia. Stable appetite with no abdominal pain, melena,  or hematochezia. No orthopnea, PND, or lower extremity edema. No focal motor weakness, memory problems, or speech deficits. Otherwise systems reviewed and negative except as already outlined.   Physical Examination Filed Vitals:   02/13/11 1323  BP: 136/75  Pulse: 56  GEN: 71 yo male, sitting upright, in no distress HEENT: NCAT,PERRLA,EOMI NECK: palpable pulses, no bruits; no JVD; no TM LUNGS: CTA bilaterally HEART: RRR (S1S2); no significant murmurs; no rubs; no gallops ABD: soft, NT; intact BS EXT: intact femoral pulses, no bruits; minimally palpable DPs; trace edema SKIN: warm, dry MUSC: no obvious deformity NEURO: A/O (x3)    ECG Sinus bradycardia at 55 beats per minute with left bundle branch block, old.   Problem List  and Plan

## 2011-02-13 NOTE — Assessment & Plan Note (Signed)
No changes made to present regimen. 

## 2011-02-13 NOTE — Assessment & Plan Note (Signed)
Plan to followup with a 2-D echocardiogram to reassess LVEF. May need to consider electrophysiology referral to discuss defibrillator. This has been discussed with him.

## 2011-02-13 NOTE — Patient Instructions (Signed)
Your physician wants you to follow-up in: 6 months. You will receive a reminder letter in the mail one-two months in advance. If you don't receive a letter, please call our office to schedule the follow-up appointment. Your physician has requested that you have an echocardiogram. Echocardiography is a painless test that uses sound waves to create images of your heart. It provides your doctor with information about the size and shape of your heart and how well your heart's chambers and valves are working. This procedure takes approximately one hour. There are no restrictions for this procedure. If the results of your test are normal or stable, you will receive a letter. If they are abnormal, the nurse will contact you by phone. Your physician recommends that you continue on your current medications as directed. Please refer to the Current Medication list given to you today.

## 2011-02-13 NOTE — Assessment & Plan Note (Signed)
Old

## 2011-02-13 NOTE — Assessment & Plan Note (Signed)
No active angina. Continue medical therapy.

## 2011-02-17 ENCOUNTER — Other Ambulatory Visit: Payer: Self-pay | Admitting: Cardiology

## 2011-02-18 ENCOUNTER — Other Ambulatory Visit: Payer: Self-pay | Admitting: Cardiology

## 2011-02-18 DIAGNOSIS — I429 Cardiomyopathy, unspecified: Secondary | ICD-10-CM

## 2011-02-18 MED ORDER — FUROSEMIDE 20 MG PO TABS: 20.0000 mg | ORAL_TABLET | Freq: Every day | ORAL | Status: DC

## 2011-02-19 NOTE — Telephone Encounter (Signed)
[place] pt. 

## 2011-02-19 NOTE — Telephone Encounter (Signed)
Carvedilol and lasix rx sent to [facility] yesterday.

## 2011-02-23 ENCOUNTER — Other Ambulatory Visit: Payer: Self-pay | Admitting: Cardiovascular Disease

## 2011-02-24 LAB — GLUCOSE, CAPILLARY

## 2011-02-24 LAB — BASIC METABOLIC PANEL
BUN: 19 mg/dL (ref 6–23)
CO2: 24 mEq/L (ref 19–32)
CO2: 24 mEq/L (ref 19–32)
Chloride: 104 mEq/L (ref 96–112)
Chloride: 106 mEq/L (ref 96–112)
GFR calc Af Amer: >60 mL/min (ref >60)
GFR calc non Af Amer: 56 mL/min — ABNORMAL LOW (ref >60)
Glucose, Bld: 174 mg/dL — ABNORMAL HIGH (ref 70–99)
Potassium: 4.7 mEq/L (ref 3.5–5.1)
Potassium: 5 mEq/L (ref 3.5–5.1)
Sodium: 138 mEq/L (ref 135–145)

## 2011-02-24 LAB — CBC
HCT: 32.1 % — ABNORMAL LOW (ref 39.0–52.0)
HCT: 32.8 % — ABNORMAL LOW (ref 39.0–52.0)
HCT: 33.5 % — ABNORMAL LOW (ref 39.0–52.0)
Hemoglobin: 11.1 g/dL — ABNORMAL LOW (ref 13.0–17.0)
Hemoglobin: 11.4 g/dL — ABNORMAL LOW (ref 13.0–17.0)
MCHC: 33.7 g/dL (ref 30.0–36.0)
MCV: 90.2 fL (ref 78.0–100.0)
MCV: 90.4 fL (ref 78.0–100.0)
MCV: 91.5 fL (ref 78.0–100.0)
RBC: 3.51 MIL/uL — ABNORMAL LOW (ref 4.22–5.81)
RDW: 16.8 % — ABNORMAL HIGH (ref 11.5–15.5)
RDW: 16.8 % — ABNORMAL HIGH (ref 11.5–15.5)
WBC: 7.1 10*3/uL (ref 4.0–10.5)

## 2011-03-02 ENCOUNTER — Ambulatory Visit
Admission: RE | Admit: 2011-03-02 | Discharge: 2011-03-02 | Disposition: A | Discharge status: Home / Self Care | Payer: Medicare HMO | Source: Ambulatory Visit | Attending: Neurology | Admitting: Neurology

## 2011-03-02 DIAGNOSIS — I671 Cerebral aneurysm, nonruptured: Secondary | ICD-10-CM

## 2011-03-11 ENCOUNTER — Ambulatory Visit
Admission: RE | Admit: 2011-03-11 | Discharge: 2011-03-11 | Disposition: A | Discharge status: Home / Self Care | Payer: Medicare HMO | Source: Ambulatory Visit | Attending: Neurology | Admitting: Neurology

## 2011-03-11 ENCOUNTER — Other Ambulatory Visit: Payer: Self-pay | Admitting: Neurology

## 2011-03-11 DIAGNOSIS — M79609 Pain in unspecified limb: Secondary | ICD-10-CM

## 2011-03-24 ENCOUNTER — Ambulatory Visit (INDEPENDENT_AMBULATORY_CARE_PROVIDER_SITE_OTHER): Payer: Medicare HMO | Admitting: Internal Medicine

## 2011-03-24 ENCOUNTER — Encounter: Payer: Self-pay | Admitting: Internal Medicine

## 2011-03-24 DIAGNOSIS — I5022 Chronic systolic (congestive) heart failure: Secondary | ICD-10-CM

## 2011-03-24 DIAGNOSIS — I429 Cardiomyopathy, unspecified: Secondary | ICD-10-CM

## 2011-03-24 DIAGNOSIS — I2510 Atherosclerotic heart disease of native coronary artery without angina pectoris: Secondary | ICD-10-CM

## 2011-03-24 NOTE — Progress Notes (Signed)
HPI  [name] is a 70 y.o. male seen at the request of Dr. Diona Browner for consideration of ICD implantation.  His history of ischemic and nonischemic heart disease. In 2010 he underwent atherectomy of an RCA is heavily calcified and in July 2011 PTCA of an in-stent RCA stenosis. LVEF was in the range of 25-35% around that time, and a followup echocardiogram was suggested. This is apparently repeated recently; he continued to show left ventricular dysfunction and he is referred for an ICD. Symptomatically [name] states that he is feeling well, continues to dance a number of days a week. He says he can climb a flight of stairs without significant shortness of breath. He denies nocturnal dyspnea orthopnea. He's had peripheral edema only of late. He is copious in his fluid intake. His diet has markedly changed over the last couple years with elimination of red meat and to decrease his salt.  He's had no significant palpitations. He has no syncope. He does have a history of atrial fibrillation with a prior TIA. He is not on Coumadin as he takes aspirin and Plavix.  The other issue is that he has an aneurysm which has been followed very carefully by Dr. Graciella Freer. There has been concern about the role of an MRI and is precluding of his having serial MR.  Past Medical History  Diagnosis Date  . Mild renal insufficiency   . Type 2 diabetes mellitus   . Coronary atherosclerosis of native coronary artery     DES RCA 5/10, PTCA RCA (ISR), 7/11  . Hyperlipidemia   . TIA (transient ischemic attack)   . Cardiomyopathy, ischemic     LVEF 25-35%  . Morbid obesity   . Mitral regurgitation     Mild  . BPH (benign prostatic hypertrophy)   . LBBB (left bundle branch block)   . Paroxysmal atrial fibrillation     Past Surgical History  Procedure Date  . Cholecystectomy   . Total knee arthroplasty   . Vasectomy   . Lung surgery     Current Outpatient Prescriptions  Medication Sig Dispense Refill    . acetaminophen (TYLENOL) 650 MG CR tablet Take 2 by mouth twice daily        . aspirin 325 MG tablet Take 325 mg by mouth daily.        . carvedilol (COREG) 6.25 MG tablet TAKE 1 TABLET BY MOUTH TWICE DAILY EMERGENCY REFILL FAXED DR  60 tablet  6  . clopidogrel (PLAVIX) 75 MG tablet 1 tab po qd  30 tablet  6  . felodipine (PLENDIL) 10 MG 24 hr tablet Take 10 mg by mouth daily.        . furosemide (LASIX) 20 MG tablet Take 1 tablet (20 mg total) by mouth daily.  30 tablet  6  . insulin glargine (LANTUS) 100 UNIT/ML injection Inject into the skin at bedtime.        . insulin regular (HUMULIN R,NOVOLIN R) 100 UNIT/ML injection Inject into the skin as directed.        Marland Kitchen L-Methylfolate-B6-B12 (NEURPATH-B) 3-35-2 MG TABS Take one by mouth twice daily        . metFORMIN (GLUCOPHAGE) 1000 MG tablet Take 500 mg by mouth daily with breakfast.       . nitroGLYCERIN (NITROSTAT) 0.4 MG SL tablet Place 0.4 mg under the tongue every 5 (five) minutes as needed.        . pyridoxine (B-6) 50 MG tablet Take 50 mg  by mouth daily.        . Zinc 50 MG CAPS Take by mouth daily.        . ferrous sulfate 324 (65 FE) MG TBEC Take by mouth daily.          Allergies  Allergen Reactions  . Codeine     REACTION: blisters    Review of Systems negative except from HPI and PMH  Physical Exam Well developed and well nourished old Caucasian male modestly obese appearing his stated age in no acute distress HENT normal E scleral and icterus clear Neck Supple JVP flat; carotids brisk and full Clear to ausculation Regular rate and rhythm, no murmurs gallops or rub Soft with active bowel sounds No clubbing cyanosis and edema Alert and oriented, grossly normal motor and sensory function Skin Warm and Dry  ECG sinus rhythm with left bundle branch block  Assessment and  Plan

## 2011-03-24 NOTE — Assessment & Plan Note (Signed)
We'll continue medical therapy. The patient clearly meets indications for ICD implantation based on the rreported ejection fraction; however, the issues related to MRI scanning of his brain makes the issue much less clear. Given the fact that his symptoms are class I and he has left bundle branch block CRT-D would be appropriate based on the MADIT-CRT protocol. I will review these issues with Dr. Sandria Manly.

## 2011-03-24 NOTE — Assessment & Plan Note (Signed)
Stable on current medications in the past congestive heart failure was precipitated by atrial fibrillation apparently

## 2011-03-24 NOTE — Assessment & Plan Note (Signed)
Stable; continue aspirin and Plavix

## 2011-03-30 ENCOUNTER — Encounter: Payer: Self-pay | Admitting: Internal Medicine

## 2011-03-31 NOTE — Discharge Summary (Signed)
NAME:  [name], [name] NO.:  [id number]   MEDICAL RECORD NO.:  [id number]          PATIENT TYPE:  INP   LOCATION:  2505                         FACILITY:  [facility]   PHYSICIAN:  [name], MDDATE OF BIRTH:  1941/10/11   DATE OF ADMISSION:  04/09/2009  DATE OF DISCHARGE:  04/10/2009                               DISCHARGE SUMMARY   PRIMARY CARDIOLOGIST:  Tyler Sidle, MD   PRIMARY CARE [name]:  Tyler Peri, MD   DISCHARGE DIAGNOSIS:  Coronary artery disease, status post successful  rotational atherectomy, and Xience drug-eluting stent placement this  admission.   SECONDARY DIAGNOSES:  1. Ischemic cardiomyopathy/chronic systolic congestive heart failure.  2. Diabetes mellitus.  3. Hyperlipidemia.  4. History of transient ischemic attack.  5. Paroxysmal atrial fibrillation, previously on Coumadin.  6. Benign prostatic hypertrophy.  7. Osteoarthritis, status post bilateral knee replacement.  8. Morbid obesity.  9. History of mild renal insufficiency.  10.Mild mitral regurgitation.  11.Chronic left bundle-branch block.   ALLERGIES:  CODEINE.   PROCEDURES:  Left heart cardiac catheterization with successful  percutaneous coronary intervention and stenting of the right coronary  artery with placement of a 3.5 x 28 mm Xience V drug-eluting stent.   HISTORY OF PRESENT ILLNESS:  A 70 year old Caucasian male with prior  history of nonobstructive coronary artery disease and nonischemic  cardiomyopathy.  He recently underwent 2-D echocardiogram performed as  an outpatient showing an EF of 25% with severe hypokinesis of the  inferior, posterior, and anterior apical walls.  He was subsequently  followed up with pharmacologic stress test revealing moderate inferior  ischemia and EF of 36%.  He then was seen in our [facility] on May 19,  with plans arranged for cardiac catheterization.   HOSPITAL COURSE:  The patient presented to the Cardiac cath lab  on Apr 09, 2009.  He underwent a left heart cardiac catheterization revealing  95% heavily calcified stenosis in the right coronary artery, but  otherwise nonobstructive disease and an EF of 25-30% with severe  hypokinesis of the anterolateral, apical, and inferior walls.  The RCA  was felt to be the culprit vessel and this was successfully treated  initially with rotational atherectomy, followed by drug-eluting stent  placement with a 3.5 x 28 mm Xience V stent.  In the setting of heavy  calcification, there was mild residual stenosis following stenting due  to an inability to fully expand the stent.  As such, at the time of  discharge, we have opted to sent the patient home on Plavix 75 mg 2  tablets daily for 1 month, and then 75 mg daily.  This decision was  discussed with the patient's primary cardiologist, Dr. Diona [name] and  decision has also been made to discontinue Coumadin at this time.   [name] has had no recurrent symptoms following this procedure.  He  has been ambulating without difficulty.  He will be discharged home  today in good condition.   DISCHARGE LABORATORY DATA:  Hemoglobin 10.8, hematocrit 32.1, WBC 7.1,  platelets 212.  Sodium 133, potassium 4.7, chloride  104, CO2 of 24, BUN  13, creatinine 1.03, glucose 248, calcium 8.7.   DISPOSITION:  The patient will be discharged home today in good  condition.   FOLLOWUP PLANS AND APPOINTMENTS:  We have arranged for follow up with  Dr. Diona [name] on June 21, at 2:15 p.m.  He will follow up with Dr. Clelia [name]  as previously scheduled.   DISCHARGE MEDICATIONS:  1. Aspirin 325 mg daily.  2. Plavix 75 mg 2 tablets daily x1 month and 1 tablet daily.  3. Metformin 1000 mg b.i.d. to be resumed on Apr 12, 2009.  4. Lisinopril 20 mg daily.  5. Felodipine 10 mg daily.  6. Flomax 0.4 mg every other day.  7. Coreg 3.125 mg b.i.d.  8. Glyburide 5 mg b.i.d.  9. Multivitamin every day.  10.B6 vitamin 50 mg daily.  11.Tylenol  Arthritis 2 caps daily.  12.Crestor 10 mg nightly.  13.Nitroglycerin 0.4 mg sublingual p.r.n. chest pain.   The patient has been advised to discontinue Coumadin therapy.   OUTSTANDING LABORATORY STUDIES:  None.   DURATION OF DISCHARGE ENCOUNTER:  Forty five minutes including physician  time.      [name], ANP      Tyler Carrow, MD  Electronically Signed    CB/MEDQ  D:  04/10/2009  T:  04/10/2009  Job:  [id number]   cc:   Tyler Peri, MD

## 2011-03-31 NOTE — Assessment & Plan Note (Signed)
[facility]                          [facility] OFFICE NOTE   JUANA, MONTINI                        MRN:          [id number]  DATE:12/02/2007                            DOB:          Jul 16, 1941    CARDIOLOGIST:  Jonelle Sidle, MD   PRIMARY CARE PHYSICIAN:  [name], MD   REASON FOR FOLLOW UP:  High blood pressure.   HISTORY OF PRESENT ILLNESS:  [name] is a 70 year old male patient  with a history of nonischemic cardiomyopathy, mild to moderate  nonobstructive coronary artery disease, paroxysmal atrial fibrillation  on chronic Coumadin therapy and diabetes mellitus who presents to the  office today after an approximate 22-month hiatus for follow-up.  He was  last seen Apr 05, 2006.  At that time he was set up for follow-up  echocardiogram which revealed improved ejection fraction of 40-45%.   The patient tells me over last 5-6 months he noticed his blood pressure  increasing.  He has never had a problem with his blood pressure being  over 130 systolically and 70s to 80s diastolic.  He has followed with  Dr. Sherryll Burger a couple of times since Dr. Magnus Ivan death.  [name] is the  newest medication as to his medical regimen.  His blood pressure is not  improved.  His last set of blood work performed was in October 2008.  This revealed a creatinine of 1.2 and potassium of 5.  Of note, his  triglycerides were 325, total cholesterol 182, HDL 30, LDL 97.   The patient denies any history of headaches, blurry vision, monocular  blindness, unilateral weakness, difficulty with speech or facial  drooping.  He denies any chest pain or shortness of breath.  Denies any  exertional chest heaviness or tightness.  Denies any syncope.  Denies  orthopnea, PND or pedal edema.  Denies any palpitations.   CURRENT MEDICATIONS:  1. Glyburide 5 mg b.i.d.  2. Coumadin as directed.  3. Glucophage 1000 mg b.i.d.  4. Metoprolol 50 mg daily.  5. Lisinopril  40 mg b.i.d.  6. Aldactone 25 mg daily.  7. Felodipine 5 mg b.i.d.  8. Celebrex 200 mg daily.  9. Tekturna 150 mg daily.  10.Multivitamin.  11.Flomax 0.4 mg on odd days.   PHYSICAL EXAMINATION:  GENERAL:  Well-nourished, well-developed man.  VITAL SIGNS:  Blood pressure 157/87, pulse 70, respirations 15.  HEENT:  Normal.  NECK:  Without JVD.  LYMPH:  Without lymphadenopathy.  CARDIAC:  Normal S1, S2.  Regular rate and rhythm without murmurs.  LUNGS:  Clear to auscultation bilaterally.  ABDOMEN:  Soft, nontender, normoactive bowel sounds.  No organomegaly.  EXTREMITIES:  Without edema.  NEUROLOGIC:  He is alert and oriented x3.  Cranial nerves II-XII grossly  intact.   STUDIES:  Electrocardiogram reveals sinus rhythm with a heart rate of  72, borderline first degree AV block with a PR interval of 206  milliseconds, left bundle branch block.   IMPRESSION:  1. Hypertension, uncontrolled.  2. Nonischemic cardiomyopathy with a previous EF of 40-45% by      echocardiogram  June 2007.  3. Mild to moderate nonobstructive coronary artery disease by      catheterization 2001.  4. Diabetes mellitus type 2.  5. Untreated dyslipidemia.  6. Osteoarthritis.  7. Benign prostatic hypertrophy.  8. History of TIA in 1999.  9. Paroxysmal atrial fibrillation.  Chronic Coumadin therapy -      followed by Dr. Sherryll Burger.   PLAN:  Mr. Armstrong presents to the office today for follow-up.  He has  had increasing blood pressures over the last several months and has had  some medication adjustments without success.  He is fairly asymptomatic  with this.  He is on multiple medications for blood pressure control.  He is also on multiple medications that could affect renal function.  At  this point time we plan to:  1. Discontinue his metoprolol.  2. Initiate carvedilol 3.25 mg b.i.d. to see if we can achieve better      blood pressure control with a nonselective beta blocker.  3. Check a C-met, CBC and  TSH.  4. Obtain renal arterial ultrasound to rule out renal artery stenosis.      This will have to be done in our [place] office.  We will also      obtain a 2-D echocardiogram at that time to reassess his LV      function.  5. We will obtain fasting lipids to follow up on his dyslipidemia.  We      will discuss with him at his follow up the possibility of statin      therapy.  He would certainly benefit from this given his diabetes      and history of proven vascular disease.  His goal LDL will be <70.  6. We will have him return in the next four-weeks to follow up on the      above testing and reassess his blood pressure.  The patient was      also seen by Dr. Diona Browner today.      Tyler Newcomer, PA-C  Electronically Signed      Jonelle Sidle, MD  Electronically Signed   SW/MedQ  DD: 12/02/2007  DT: 12/02/2007  Job #: [id number]   cc:   Jonelle Sidle, MD  [name], MD

## 2011-03-31 NOTE — Assessment & Plan Note (Signed)
[facility]                          [facility]ICE NOTE   JAYQUON, THEILER                        MRN:          [id number]  DATE:03/30/2008                            DOB:          08/08/1941    CARDIOLOGIST:  Jonelle Sidle, MD   PRIMARY CARE PHYSICIAN:  [name], MD   REASON FOR VISIT:  Three month followup.   HISTORY OF PRESENT ILLNESS:  Mr. Federici is a pleasant 70 year old male  patient with a history of nonischemic cardiomyopathy and mild to  moderate nonobstructive coronary disease, paroxysmal atrial fibrillation  on chronic Coumadin therapy that is followed by Dr. Sherryll Burger who presents to  the office today for a routine followup.  When last seen by Dr. Diona Browner  his felodipine was increased to 10 mg a day.  His Aldactone was  decreased to 12.5 mg a day secondary to recent mild hyperkalemia of 5.5.  He was also started on low dose simvastatin at 20 mg a day secondary to  hyperlipidemia with an LDL of 110.   The patient presents to the office today for followup.  Overall he is  doing well.  He denies chest pain, shortness breath, syncope or near  syncope, palpitations, orthopnea, PND, pedal edema.  He does bring in  his blood pressures.  His blood pressure readings at home average out to  about 135/60.  The patient does note significant myalgias to simvastatin  and had discontinued this after about a week's therapy.   CURRENT MEDICATIONS:  1. Glyburide 10 mg b.i.d.  2. Coumadin as directed.  3. Glucophage 1 gram b.i.d.  4. Carvedilol 3.125 mg b.i.d.  5. Lisinopril 40 mg b.i.d.  6. Aldactone 25 mg half tablet daily.  7. Felodipine 10 mg daily.  8. Flomax 0.4 mg every other day.  9. Celebrex 200 mg daily.  10.Tekturna 300 mg daily.  11.Multivitamin daily.  12.Vitamin B6.  13.NitroQuick.  14.Vicodin p.r.n.   PHYSICAL EXAM:  GENERAL:  He is a well-nourished, well-developed male.  VITAL SIGNS:  Blood pressure is 140/76,  pulse 68, weight 234 pounds.  HEENT:  Is normal.  NECK:  Without JVD.  CARDIOVASCULAR:  Normal S1, S2, regular rate and rhythm without murmur.  LUNGS:  Clear to auscultation bilaterally.  ABDOMEN:  Soft, nontender.  EXTREMITIES:  Without edema.  NEUROLOGIC:  He is alert and oriented x3.  Cranial nerves II-XII grossly  intact.   IMPRESSION:  1. Hypertension.  2. Dyslipidemia.      a.     Unable to tolerate simvastatin secondary to myalgias.  3. Nonischemic cardiomyopathy with an EF (ejection fraction) of 45%.  4. Mild to moderate nonobstructive coronary disease by catheterization      2001.  5. Diabetes mellitus type 2.  6. Paroxysmal atrial fibrillation.      a.     Chronic Coumadin therapy - followed by Dr. Sherryll Burger.  7. History of TIA (transient ischemic attack) in 1999.   PLAN:  1. Mr. Lafond presents for followup.  His blood pressure overall looks      to be  fairly stable.  His goal is actually less than 130/80 with      his diabetes.  He could tolerate an increase in his carvedilol.  I      have increased his carvedilol to 6.25 mg b.i.d.  2. We will set the patient up for a BMET today to follow up on his      renal function and potassium given the recent adjustment in his      spironolactone therapy.  3. The patient had problems with simvastatin.  I have asked him to try      a special schedule with his simvastatin.  He can try to take the      medication for 3 days in a row and then take 1-2 days off and then      restart his schedule.  If he has continued problems with myalgias      with this schedule I have offered to change him to pravastatin 20      mg a day.  In the event that he continues on medical therapy we      will check followup lipid and LFTs in 12 weeks' time.  4. The patient will be brought back in routine followup in the next 6      months or sooner p.r.n.      Tereso Newcomer, PA-C  Electronically Signed      Jonelle Sidle, MD  Electronically  Signed   SW/MedQ  DD: 03/30/2008  DT: 03/30/2008  Job #: [id number]   cc:   [name], MD

## 2011-03-31 NOTE — Assessment & Plan Note (Signed)
[facility]                          [facility]ICE NOTE   [name], [name]                        MRN:          [id number]  DATE:04/03/2009                            DOB:          Apr 30, 1941    PRIMARY CARDIOLOGIST:  Dr. Simona Huh.   REASON FOR VISIT:  [name] is a 70 year old male with nonischemic  cardiomyopathy, who was recently referred for a routine follow-up 2-D  echocardiogram.  His ejection fraction was previously assessed at 45%,  back in February 2009.  His recent echocardiogram, however, suggested a  significant decline, with an estimated EF of 25% with severe hypokinesis  of the inferior/posterior and anterior apical walls.  There was also  mild mitral regurgitation.   Given this finding, the patient was referred for a pharmacologic stress  test, reviewed by Dr. Myrtis Ser, indicating evidence of moderate inferior  ischemia; EF 36%.   Given this result, the patient was asked to come in for early follow-up  to consider a repeat cardiac catheterization.  His previous study in  2001 indicated moderate nonobstructive CAD with an ejection fraction of  21%.   Clinically, [name] states that he continues to do fairly well, since  having lost some 50 pounds over the past several years.  He is able to  dance 3 to 4 times a week with his wife, and has been doing so the past  6 months.  He denies any orthopnea, PND, lower extremity edema.   With respect to any chest discomfort, however, he does refer to  occasional anterior chest tightness, for example when walking uphill.  However, this promptly relieves with rest.  He denies any such symptoms  at rest.  He also denied any chest pain during his recent stress test.   ALLERGIES:  CODEINE.   CURRENT MEDICATIONS:  1. Coumadin 5 mg, as directed.  2. Carvedilol 3.125 b.i.d.  3. Felodipine 10 daily.  4. Flomax 0.4 every other day.  5. Metformin 1000 b.i.d.  6. Lisinopril 20  b.i.d.  7. Glyburide 5 b.i.d.   PAST MEDICAL HISTORY:  1. Nonobstructive coronary artery disease.      a.     Cardiac catheterization, April 2001.  2. Type 2 diabetes mellitus.  3. Dyslipidemia.  4. History of TIA in 1999.  5. Paroxysmal atrial fibrillation.      a.     Chronic Coumadin, followed by Dr. [name].  6. BPH.  7. Osteoarthritis.      a.     Status post bilateral knee replacement.  8. Morbid obesity.  9. History of hyperkalemia, on Aldactone.  10.History of mild renal insufficiency.  11.Mild mitral regurgitation.  12.Chronic left bundle branch block.   SOCIAL HISTORY:  The patient is married.  He denies tobacco smoking.   FAMILY HISTORY:  Noncontributory for premature coronary artery disease.   REVIEW OF SYSTEMS:  As cited per HPI, all remaining systems negative.   PHYSICAL EXAMINATION:  Blood pressure 140/78, pulse 68, regular, weight  230.  GENERAL:  70 year old male, moderately obese, sitting upright, in no  distress.  HEENT:  Normocephalic, atraumatic.  NECK:  Palpable bilateral carotid pulses without bruits; no JVD at 90  degrees.  LUNGS:  Diminished breath sounds,  particularly on the left, but without  crackles or wheezes.  HEART:  Regular rate and rhythm,  with diminished heart sounds.  No  definite murmur.  ABDOMEN:  Protuberant, nontender, with intact bowel sounds.  EXTREMITIES:  Palpable bilateral femoral pulses without bruits.  Intact  peripheral pulses with no significant edema.  SKIN:  Warm and dry.  MUSCULOSKELETAL:  No gross abnormality.  NEUROLOGIC:  No focal deficit.   IMPRESSION:  [name] is a 70 year old male with history of severe  nonischemic cardiomyopathy, with previous catheterization in 2001  indicating moderate, nonobstructive coronary artery disease with a  calculated ejection fraction of 21%.  He has demonstrated improved left  ventricular function over the past few years by follow-up  echocardiograms, on medical therapy.   However, a recent surveillance 2-D  echocardiogram indicated a significant drop with an estimated EF of 25%,  with associated severe hypokinesis of the inferior/posterior and  anterior apical walls.  Given this finding, he was referred for a  pharmacologic stress test to exclude significant progression in coronary  artery disease.  This was performed as a pharmacologic stress test  employing thallium, and suggested moderate inferior ischemia with  calculated ejection fraction of 36%.   The patient presents with history suggestive of NYHA class I- II heart  failure.  However, he does indicate occasional anterior chest tightness  with moderate exertion, relieved by rest.  He denies any symptoms at  rest.   PLAN:  Following review of the recent study results with Dr. Diona Browner,  and in the context of the patient's reported symptoms of the recent  past, recommendation is to proceed with a diagnostic coronary angiogram  to definitively exclude significant CAD progression.  [name] has  multiple risk factors, including type 2 diabetes mellitus, and certainly  may have had progression of his disease since his last study in 2001.  [name] is in agreement with this recommendation and is willing to  proceed.  The risks/benefits were discussed in conjunction with Dr.  Diona Browner.   Accordingly, we will try to schedule this within the next several days,  following temporary cessation of Coumadin.  In the interim, the patient  will be maintained on whole dose aspirin.  Metformin will be placed on  hold at time of scheduled procedure.      Rozell Searing, PA-C  Electronically Signed      Jonelle Sidle, MD  Electronically Signed   GS/MedQ  DD: 04/03/2009  DT: 04/03/2009  Job #: [id number]   cc:   [name], MD

## 2011-03-31 NOTE — Assessment & Plan Note (Signed)
[facility]                          [facility] OFFICE NOTE   FREMAN, LAPAGE                        MRN:          [id number]  DATE:03/01/2009                            DOB:          09-Feb-1941    PRIMARY CARE PHYSICIAN:  [name], MD   REASON FOR VISIT:  Routine followup.   HISTORY OF PRESENT ILLNESS:  Mr. Chuba was seen last year in May.  He  has a history of a nonischemic cardiomyopathy with an ejection fraction  of 45% with underlying mild-to-moderate nonobstructive coronary  atherosclerosis documented in 2001.  He does not report any problems  with angina and has an NYHA class II dyspnea on exertion.  He states he  has been dancing 3 days a week and enjoying this well without major  functional limitation.  He has had some trouble with mild renal  insufficiency and hyperkalemia and was referred Dr. Kristian Covey since we  last saw him.  He is now off of Aldactone with most recent labs showing  a BUN and creatinine of 23 and 1.1 and a potassium of 5.3 with sodium of  135.  He has had other studies showing mild proteinuria and is back on  ACE inhibitor therapy which was held temporarily.  He has not tolerated  increasing carvedilol to 6.25 mg p.o. b.i.d. mainly related to low heart  rate.  He has been trying to lose some weight and continues on Coumadin  under the direction of Dr. Sherryll Burger with history of paroxysmal atrial  fibrillation and previous TIAs.   ALLERGIES:  CODEINE.   PRESENT MEDICATIONS:  1. Glyburide 10 mg p.o. b.i.d.  2. Coumadin as directed by Dr. Sherryll Burger to achieve his therapeutic INR of      2.0-3.0.  3. Carvedilol 3.125 mg p.o. b.i.d.  4. Felodipine 10 mg p.o. q.a.m.  5. Flomax 0.4 mg p.o. every other day.  6. Multivitamin one p.o. daily.  7. Metformin 1000 mg p.o. b.i.d.  8. Lisinopril 20 mg p.o. b.i.d.  9. Nitroglycerin 0.4 mg p.r.n.  10.Tylenol Arthritis 1 tablet p.o. b.i.d.  11.Vicodin p.r.n.   REVIEW OF SYSTEMS:   As outlined above.  Otherwise reviewed and negative.   PHYSICAL EXAMINATION:  VITAL SIGNS:  Blood pressure is 137/76, heart  rate is 65, weight is 231 pounds.  GENERAL:  The patient is comfortable in no acute distress.  Overweight.  HEENT:  Conjunctiva is normal.  Oropharynx is clear.  NECK:  Supple.  No elevated jugular venous pressure.  No loud bruits, no  thyromegaly.  LUNGS:  Clear without labored breathing at rest.  CARDIOVASCULAR:  Regular rate and rhythm, indistinct PMI, no S3 gallop  or loud systolic murmur.  ABDOMEN:  Obese.  Unable to palpate liver edge.  Bowel sounds are  present.  EXTREMITIES:  No frank pitting edema.  Mild venous varicosities noted.  Distal pulses are 2+.  SKIN:  Warm and dry.  MUSCULOSKELETAL:  No kyphosis noted.  NEUROPSYCHIATRIC:  The patient is alert and oriented x3.  Affect is  appropriate.   IMPRESSION AND RECOMMENDATIONS:  1.  Nonischemic cardiomyopathy with a left ventricular ejection      fraction of 45%.  Last echocardiogram was in February 2009.  We      will plan a followup assessment of left ventricular systolic      function and continue medical therapy.  I encouraged him to      continue to exercise regularly and work on weight loss.  He states      he would like to get down around 200 pounds.  We will keep him off      of Aldactone given problems with mild hyperkalemia over time.  He      is not reporting any problems with obvious volume overload to      require additional diuretic therapy at this point.  Follow up will      be over the next 4 months.  2. Previously documented mild to moderate nonobstructive coronary      atherosclerosis.  He is not reporting any angina.  I would like a      followup lipid profile and we did discuss trying low-dose statin      therapy once again.  He did not tolerate simvastatin due to      myalgias in the past.  Depending on his numbers, we can decide      which alternative statin might be  appropriate.  Ideally, his goal      LDL cholesterol should be around 70.     Jonelle Sidle, MD  Electronically Signed    SGM/MedQ  DD: 03/01/2009  DT: 03/02/2009  Job #: [id number]   cc:   [name], MD

## 2011-03-31 NOTE — Assessment & Plan Note (Signed)
[facility]                          [facility] OFFICE NOTE   [name], [name]                        MRN:          [id number]  DATE:05/06/2009                            DOB:          [birth year]    REASON FOR VISIT:  Post hospital followup.   Mr. Janowiak returns to our clinic after undergoing recent successful  percutaneous intervention, by Dr. Tonny Bollman.  He was found to have  severe, single-vessel CAD with a complex, 95% calcified mid RCA lesion.  Following initial treatment with high-speed rotational atherectomy, Dr.  Excell Seltzer proceeded with stenting, using a Xience drug-eluting stent;  however, there was mild residual stenosis following stenting, secondary  to inability to fully extend the stent in the setting of heavy  calcification.   Given this finding, Dr. Excell Seltzer recommended treatment with Plavix at 150  mg daily for one month, in conjunction full-dose aspirin, given that the  decision was made to not resume Coumadin, which the patient had been on  prior to admission.  The patient will then remain on Plavix 75 mg daily.   Residual anatomy notable for mild, nonobstructive residual CAD, but  severe LVD (EF 25-30%) with severe hypokinesis of anterolateral, apical,  and inferior walls.   Clinically, Mr. Reels notes some mild palpable improvement in his  stamina and exercise tolerance level.  He continues to report no  exertional angina pectoris.  He notes no complications of the right  groin incision site.   CURRENT MEDICATIONS:  1. Plavix 150 daily.  2. Aspirin 325 daily.  3. Carvedilol 3.125 b.i.d.  4. Felodipine 10 daily.  5. Flomax 0.4 q.o.d.  6. Crestor 20 daily.  7. Metformin 1000 b.i.d.  8. Glyburide 5 b.i.d.  9. Lexapro 20 daily.   PHYSICAL EXAMINATION:  VITAL SIGNS:  Blood pressure 147/75, pulse 65,  regular weight 229 (down 1).  GENERAL:  A 70 year old male, moderately obese, sitting upright, in no  distress.  HEENT:  Normocephalic and atraumatic.  NECK:  Palpable carotid pulses without bruits; no JVD.  LUNGS:  Clear to auscultation in all fields.  HEART:  Regular rate and rhythm.  No significant murmurs.  ABDOMEN:  Protuberant, nontender.  EXTREMITIES:  Stable right groin with no ecchymosis, hematoma, or bruit  on auscultation.  Intact femoral and distal pulses.  NEUROLOGIC:  No  focal deficit.   IMPRESSION:  1. Severe single-vessel CAD.      a.     Status post HSRA/drug-eluting stenting of complex, calcified       high-grade mid RCA lesion, May 2010.      b.     Residual nonobstructive CAD.      c.     EF 25-30%.  2. Chronic systolic heart failure.  3. Chronic LBBB.  4. Type 2 diabetes mellitus.  5. Dyslipidemia.  6. Mild renal insufficiency.  7. History of hyperkalemia, on Aldactone.  8. Status post TIA in 1999.  9. History of paroxysmal atrial fibrillation.      a.     Previously on Coumadin.  10.Mild mitral regurgitation.  11.Obesity.  PLAN:  1. Up titrate Coreg to 6.25 mg b.i.d., both for more aggressive blood      pressure control as well as treatment of underlying severe      cardiomyopathy.  2. Continue Plavix dosing as outlined, with 150 mg daily (x1 month),      then 75 mg daily.  We will continue full-dose aspirin, for the time-      being.  3. Follow up 2-D echocardiogram in 3 months, followed by return clinic      visit with myself and Dr. Diona Browner.      Rozell Searing, PA-C  Electronically Signed      Jonelle Sidle, MD  Electronically Signed   GS/MedQ  DD: 05/06/2009  DT: 05/07/2009  Job #: [id number]   cc:   [name], MD

## 2011-03-31 NOTE — Assessment & Plan Note (Signed)
[facility]                          [facility] OFFICE NOTE   TRELL, SECRIST                        MRN:          [id number]  DATE:01/03/2008                            DOB:          Nov 01, 1941    PRIMARY CARE PHYSICIAN:  Dr. [name].   REASON FOR VISIT:  Followup testing.   HISTORY OF PRESENT ILLNESS:  Mr. Paige was in the office and saw Mr.  Alben Spittle back in January.  He was referred for a number of studies and  followup of cardiomyopathy and elevated blood pressure.  He had an  echocardiogram done which demonstrated stable ejection fraction of 45%  with mild mitral regurgitation.  Abdominal ultrasound, although  technically difficult, revealed no obvious renal artery stenosis.  Blood  work did demonstrate a potassium of 5.5 with a BUN and creatinine of 20  and 1.1 with an LDL cholesterol of 110 and normal liver function tests,  otherwise.  His TSH was normal at 3.4.  I reviewed these tests with the  patient and his wife today, and we discussed a few changes in his  medical regimen.   ALLERGIES:  CODEINE.   PRESENT MEDICATIONS:  1. Glyburide 10 mg p.o. b.i.d.  2. Coumadin as directed.  3. Glucophage 1000 mg p.o. b.i.d.  4. Carvedilol 3.125 mg p.o. b.i.d.  5. Lisinopril 40 mg p.o. b.i.d.  6. Aldactone 25 mg p.o. daily.  7. Felodipine 5 mg p.o. daily.  8. Flomax as directed.  9. Celebrex.  10.Tekturna 300 mg p.o. q.a.m.  11.Multivitamin 1 p.o. daily.  12.Nitroglycerin 0.4 mg p.o. p.r.n.   REVIEW OF SYSTEMS:  As described in the history of present illness,  otherwise negative.   PHYSICAL EXAMINATION:  Blood pressure is 132/74, heart rate is 65,  weight is 239 pounds.  The patient stated his blood pressure was  170/70 before coming in today.  Oxygen saturation is 94% on room air.  This is an obese male, no acute distress.  HEENT:  Conjunctivae are normal.  Oropharynx is clear.  NECK:  Supple.  No elevated jugular venous  pressure, no loud bruits.  LUNGS:  Clear without labored breathing.  CARDIAC EXAM:  Reveals a regular rate and rhythm, no S3 gallop.  ABDOMEN:  Obese, unable to palpate liver edge.  Bowel sounds presents,  no bruits.  EXTREMITIES:  Exhibit no frank pitting edema.  SKIN:  Warm and dry.  MUSCULOSKELETAL:  No kyphosis noted.  NEUROPSYCHIATRIC:  The patient is alert and oriented x3.  Affect is  appropriate.   IMPRESSION/RECOMMENDATIONS:  1. Hypertension:  Will plan to continue his present regimen and      increase felodipine to 10 mg daily.  I would also like to decrease      his aldactone to 12.5 mg p.o. daily in light of his mild      hyperkalemia.  2. LDL cholesterol of 110 in the setting of known mild-to-moderate      nonobstructive coronary artery disease:  We talked about statin      therapy and risk reduction today, and will plan  to initiate      simvastatin 20 mg p.o. nightly with a followup lipid profile and      liver function tests over the next 8 to 12 weeks.  3. Paroxysmal atrial fibrillation:  On Coumadin therapy followed by      Dr. Sherryll Burger.  This has been stable symptomatically.  4. I will plan to see Mr. Knipple back in the office over the next 3      months.     Jonelle Sidle, MD  Electronically Signed    SGM/MedQ  DD: 01/03/2008  DT: 01/04/2008  Job #: [id number]   cc:   [name], MD

## 2011-04-03 NOTE — Consult Note (Signed)
NAME:  [name], [name]                           ACCOUNT NO.:  [id number]   MEDICAL RECORD NO.:  [id number]                   PATIENT TYPE:  INP   LOCATION:  0482                                 FACILITY:  [facility]   PHYSICIAN:  [facility]                  DATE OF BIRTH:  09/07/1941   DATE OF CONSULTATION:  05/29/2003  DATE OF DISCHARGE:                                   CONSULTATION   PRIMARY CARE PHYSICIAN:  Dr. Weyman Pedro.   CARDIOLOGIST:  Dr. Jonelle Sidle.   ORTHOPEDIST:  Dr. Ollen Gross.   REASON FOR CONSULTATION:  I am not as sick as you folks seem to think I am.   HISTORY OF PRESENT ILLNESS:  The patient is a 70 year old male, well known  to [facility].  Currently he is at [facility] with severe  osteoarthritis, left greater than right knee.  He is status post left total  knee arthroplasty on May 28, 2003.  This is postoperative day number one.  We are asked to consult in the postoperative period.  The patient has a  history of congestive heart failure exacerbated by paroxysmal atrial  fibrillation with a rapid ventricular rate.  He was hospitalized in April  2001, at [facility].  He underwent a left heart  catheterization at that time, after a 2-D echocardiogram showed decreased  left ventricular ejection fraction.  The catheterization study showed non-  ischemic cardiomyopathy with an ejection fraction of 21%.  He had global  left ventricular hypokinesis with akinesis of the anterior apical wall.  At  that time, he underwent a successful direct current cardioversion with a  return to sinus rhythm.  His latest echocardiogram was done on December  2003.  The study showed an ejection fraction of 35%-40% with mild left  ventricular hypertrophy, normal chamber size with trace mitral  regurgitation, trace tricuspid regurgitation.  He currently at rest after  his surgery, is not experiencing chest pain.  His oxygen  saturations are  acceptable on room air.  His breathing is not labored.  His pain from the  total knee arthroplasty is controlled with a PCA morphine pump.   ALLERGIES:  POSSIBLY TO PENICILLIN AND ALSO AN ALLERGY TO CODEINE.   MEDICATIONS PRIOR TO ADMISSION:  1. Glyburide 5 mg b.i.d.  2. Glucophage 1000 mg b.i.d.  3. Altace 10 mg b.i.d.  4. Toprol XL 50 mg q.a.m.  5. Aldactone 25 mg q.a.m.  6. Norvasc 5 mg q.a.m.  7. Celebrex 200 mg q.a.m.  8. Coumadin 5 mg daily at bedtime.   PAST MEDICAL HISTORY:  1. Coronary artery disease, moderate but nonobstructive coronary artery     disease by a left heart catheterization in April 2001.  2. Severely decreased left ventricular function with non-ischemic     cardiomyopathy.  3. History of paroxysmal atrial fibrillation/congestive  heart failure, in     the setting of decreased left ventricular function with successful direct     cardioversion in April 2001, currently on maintenance Coumadin.  4. Type 2 diabetes mellitus, diagnosed 10 years ago, non-insulin-dependent.  5. Obesity.  6. Hypertension.  7. Osteoarthritis, both knees, with terminal joint degeneration on the left.  8. Unknown lipid status.  9. Borderline abnormal TSH on hospitalization in April 2001.  10.      History of TIA's.  Do not seem to be carotid derived.  He is on     chronic Coumadin for this.  They have effected mainly the right lower     extremity, the right facial area.  They are extremely transient.  He has     up to 30 a day.  11.      History of tobacco habituation, having quit four years ago.   PAST SURGICAL HISTORY:  1. Status post left total knee arthroplasty, postoperative day number one     today.  2. Status post lung tissue biopsy x2 in his 78s at [facility].  He was treated with prednisone and had negative followup after     five years.  3. Status post open cholecystectomy.   SOCIAL HISTORY:  He is married for the past 31 years.   He has two grown  children, in good health.  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  He has a history of tobacco use, having quit four years ago.  He has a two-pack-per-day x39 years history.  He does not partake of  alcoholic beverages.   FAMILY HISTORY:  Father died at age 29 of prostate cancer.  Mother died at  age unknown.  She was a diabetic.  One brother living, with hypertension.  One sister living, with a history of cancer.   PHYSICAL EXAMINATION:  GENERAL:  The patient is alert and oriented x3.  He  has mild distress, status post left total knee arthroplasty.  VITAL SIGNS:  Temperature 98 degrees, blood pressure 151/72, pulse 103 and  regular, respirations 22 and even.  HEENT:  Eyes:  Pupils equal, round, reactive to light.  Extraocular  movements intact.  Sclerae clear.  Nares patent.  Oropharynx shows that he  does not wear dentures.  Mucous membranes moist, without lesions or  erythema.  NECK:  There is no jugular venous distention.  No carotid bruits  auscultated.  No cervical lymphadenopathy.  LUNGS:  Relatively clear bilaterally.  HEART:  A regular rate and rhythm with a grade 2/6 systolic murmur.  ABDOMEN:  Obese, bowel sounds heard throughout.  He is producing flatus this  morning.  He has a well-healed right upper quadrant cicatrix from open  cholecystectomy.  EXTREMITIES:  There is compression hose on the right with a palpable pulse  on the right dorsalis pedis.  An Ace wrap on the left at the knee, with a  palpable dorsalis pedis pulse.  He has no evidence of peripheral edema.  NEUROLOGIC:  No evidence of neurologic deficit.  Moving both upper and lower  extremities appropriately.  Speech is clear.   LABORATORY DATA:  Today hemoglobin 12.1, hematocrit 35.2, white cells 17.5,  platelets 285.  Sodium 129, potassium 6.7, chloride 101, bicarbonate 21, BUN 29, creatinine 1.5, serum glucose 331.  His baseline creatinine on May 24, 2003, is  1.4.   CARDIAC INTERVENTIONS IN THE PAST:  1. A 2-D  echocardiogram in December 2003:  Left ventricular ejection     fraction of 34%-40%, mild concentric left ventricular hypertrophy.  A     trace mitral regurgitation.  Left atrium is upper size normal.  No aortic     stenosis.  Trivial tricuspid regurgitation.  2. Left heart catheterization in April 2001:  Severe global hypokinesis with     akinesis of the anterior apical wall, ejection fraction of 21%.  The     coronary angiography:  Left main coronary artery with no disease.  The     left anterior descending coronary artery with 40% proximal and 40%     distal.  The left circumflex had a 25% mid-point stenosis.  There is a     large ramus, a small first obtuse marginal, a large second obtuse     marginal, with diffuse disease in the small third obtuse marginal.  The     right coronary artery had a 50% tubular proximal stenosis and a mid-point     stenosis of 40%.  There are distal 30% and 60% stenoses.  There is a     small PDA and two small posterolateral branches.  The finding is of     moderate but non-obstructive coronary artery disease.   IMPRESSION:  1. History of paroxysmal atrial fibrillation/congestive heart failure, with     decreased left ventricular function and moderate coronary artery disease.  2. Status post left total knee arthroplasty on May 28, 2003.  3. Type 2 diabetes mellitus.  4. Hypertension.  5. Obesity.  6. Unknown lipid status.  7. Unknown lipid function.  8. Hyperkalemia currently, on Lasix 20 mg intravenously b.i.d.  9. Hyponatremia, on normal saline drip currently.   RECOMMENDATIONS:  Correct elevated potassium by discontinuing Aldactone and  holding his Altace.  We will recheck his potassium immediately.  If it is  still elevated, he will probably need Kayexalate and gentle diuresis as  ordered by the orthopedic service is to continue.  As the potassium  improves, we will resume a lower dose of  Altace, mindful of creatinine  values.  We will check a stat electrocardiogram.  This study is just in and  shows a first-degree AV block with left bundle branch block, similar to pre-  surgical electrocardiogram.  We will also aggressively correct serum blood  glucose, in an effort to moderate serum potassium levels.     Maple Mirza, P.A.                    [facility]    GM/MEDQ  D:  05/29/2003  T:  05/29/2003  Job:  [id number]

## 2011-04-03 NOTE — Discharge Summary (Signed)
NAME:  [name], [name]                           ACCOUNT NO.:  [id number]   MEDICAL RECORD NO.:  [id number]                   PATIENT TYPE:  INP   LOCATION:  0482                                 FACILITY:  [facility]   PHYSICIAN:  Ollen Gross, M.D.                 DATE OF BIRTH:  [birth year]   DATE OF ADMISSION:  05/28/2003  DATE OF DISCHARGE:  06/02/2003                                 DISCHARGE SUMMARY   ADMITTING DIAGNOSES:  1. Bilateral knees osteoarthritis, left more symptomatic then right.  2. History of cerebrovascular accident in 1999 (mini stroke).  3. Cerebral atherosclerotic vascular disease.  4. Hypertension.  5. Coronary arterial disease.  6. History of atrial fibrillation.  7. History of congestive heart failure.  8. Non-insulin dependent diabetes mellitus.  9. Chronic coumadinization.   DISCHARGE DIAGNOSES:  1. Osteoarthritis, left knee, status post left total knee replacement     arthroplasty.  2. Osteoarthritis, right knee.  3. History of cerebrovascular accident in 1999.  4. Cerebral atherosclerotic vascular disease.  5. Hypertension.  6. Coronary arterial disease.  7. History of paroxysmal atrial fibrillation.  8. History of congestive heart failure.  9. Non-insulin dependent diabetes mellitus.  10.      Chronic coumadinization.  11.      Obesity.  12.      Postoperative hyperkalemia, improved.  13.      Postoperative hyponatremia.  14.      Postoperative blood loss anemia.   PROCEDURE:  The patient was taken to the OR on May 28, 2003, underwent a  left total knee arthroplasty.  Surgeon was Dr. Lequita Halt.  Assistant Avel Peace, P.A.-C.  Spinal anesthesia, minimal blood loss.  Hemovac drain x1.  Tourniquet time 57 minutes at 350 mmHg.   CONSULTATIONS:  [facility], Dr. Diona Browner.   BRIEF HISTORY:  The patient is a 70 year old male seen by Dr. Lequita Halt for  bilateral knee pain, left more symptomatic then right.  Pain has been  increasing for some  time now.  He has had previous knee scope in the past  for a torn meniscus, did help for a while, but pain has been increasing, and  started to interfere with his activity.  He was seen in the office where x-  rays show bone-on-bone changes medially with large lateral marginal  osteophytes, patellofemoral osteophytes.  It was felt he would benefit from  undergoing total knee.  Risks and benefits discussed.  The patient is  subsequently admitted to the hospital.   LABORATORY DATA:  CBC on admission:  Hemoglobin 13.1, hematocrit 38.2, white  cell count 8.6, red cell count 4.18.  Differential within normal limits.  Followup CBC showed an increased white count of 17.5, hemoglobin of 12.1 and  35.2.  H&H down to 10.4 and 30.6.  Last noted CBC:  White count back to  normal 8.2, hemoglobin 9.8, hematocrit 28.4.  PT and PTT preop were 18.1 and  44, respectively, with an elevated INR of 1.8.  INR came back down from  surgery.  Serial pro times and INR's followed postop.  Last noted PT/INR  19.8 and 2.1.  Chem panel on admission showed sodium of 133, elevated  potassium of 5.9 preop, elevated glucose of 229, elevated BUN of 20,  remaining chem panel all within normal limits.  Serial BMET's were followed.  The potassium went up from 5.9 to as high as 6.7, treated by cardiology.  Potassium came back down to within normal limits at 4.6.  Sodium went from  133 down to as low as 125 back up to 130.  Glucose went up from 229 as high  as 331 back down to 146.  BUN started high at 29 to normal at 22.  Calcium  9.3, got as low as 8.3 back up to 9.1.  Hemoglobin A1C elevated at 8.2%.  B-  type natruretic peptide less then 30.  Lipid profile ordered.  Cholesterol  163, triglycerides elevated at 183.  Panel shows HDL cholesterol 43, total  cholesterol/HDL ratio 3.8, VLDL at 37, LDL at 83.  Thyroid panel shows T4  normal at 7.9, TSH was elevated at 5.576, free T3 level at 2.6.  Urinalysis:  Trace ketones, trace  leukocyte esterase, few epithelial cells, 0 to 2 white  cells, and rare bacteria.  Blood group type A positive.   EKG dated May 29, 2003, sinus rhythm with first degree AV block and  occasional PVC's, left bundle branch block, no significant change since last  tracing.  Confirmed by Dr. Charlton Haws.  Chest x-ray dated May 24, 2003,  preop, cardiomegaly, no active disease.  X-rays of the left tib/fib no acute  injury to the left tibia or fibula dated May 31, 2003.  Left knee films two  view:  Knee joint effusion, small to moderate.  Total left knee:  Femur  appears intact without acute fracture or malalignment.  Soft tissue planes  appear symmetric.  Left hip also appears located, no acute injury.   HOSPITAL COURSE:  The patient was admitted to [facility], taken to  the OR, underwent the above stated procedure without complication.  The  patient tolerated the procedure well, was later transferred to the recovery  room then to the orthopaedic floor for continued postoperative care.  His  vital signs were followed, and may be found in the graft section of this  chart.  The patient did have positive fluid balance and has a significant  history of congestive heart failure, was also noted to have elevated  potassium and low sodium postop.   Due to the significant cardiac history, cardiology was consulted, [facility] assisted with management of the patient postop.  The patient was  initially placed on 24 hours of postop IV antibiotics, started back on his  Coumadin he was on chronically before surgery.  Also given concomitant  Lovenox for coverage until the Coumadin was therapeutic.  Placed  weightbearing as tolerated to the left lower extremity.  PT and OT were  consulted to assist with gait training, ambulation, ADL's.  He was started  back on his home meds.  Medications were adjusted.  Some of the cardiac meds such as the Aldactone and Altace were held because of the  potassium levels.  The patient also underwent gentle diuresis due to the electrolyte imbalance  and mainly because of the positive fluid balance with the significant  history of heart failure.  EKG and other labs were ordered.  The patient  underwent a lipid profile and a thyroid profile as per medical services.  The electrolytes were followed very closely, and may be found in the  laboratory data section.  The Hemovac drain which was placed at time of  surgery was pulled without difficulty.   From a therapy standpoint, the patient progressed very slowly with the  initial therapy.  Was up only ambulating approximately 11 feet by postop day  #2.  Dressing change was also initiated to the left lower extremity by day  #2.  The incision was healing well.  He was initially placed on PCA  analgesics, but weaned over to p.o. meds also by day #2.  The IV was hep-  locked.  Medications were adjusted for the elevated glucose which was noted  preop for better control of his diabetes.  Also the electrolyte imbalance  did improve throughout the hospital course with medication adjustment and  diuresis.  BNP was checked which was found to be less then 30.  He did have  a hemoglobin A1C which reflect inadequate control, it was elevated.  The  Glyburide was increased.  The patient started to slowly progress with  physical therapy and had gotten up to greater then 70 feet by postop day #3,  and then over 100 feet by postop day #4.  The patient was feeling better  throughout the hospital course.  The electrolytes were improving, strength  was improving, he was on Lovenox and Coumadin.  Once the INR became  therapeutic, the Lovenox was discontinued.  Incision continued to heal well.  By day #5, the patient was stable from a medical standpoint.  Electrolytes  had improved.  He was up ambulating quite well, and it was decided the  patient could be discharged home at that time.   DISCHARGE PLAN:  The patient  was discharged home on June 02, 2003.   DISCHARGE DIAGNOSES:  Please see above.   DISCHARGE MEDICATIONS:  1. Coumadin.  2. Percocet.  3. Robaxin.  4. Glyburide was increased.   DIET:  Diabetic cardiac diet.   ACTIVITY:  Weightbearing as tolerated to the left lower extremity.  Continue  with home health PT and home health nursing.  Total knee protocol, Coumadin  protocol.   FOLLOWUP:  Two weeks from surgery.   DISPOSITION:  Home.   CONDITION ON DISCHARGE:  Improving.     Alexzandrew L. Julien Girt, P.A.              Ollen Gross, M.D.    ALP/MEDQ  D:  07/09/2003  T:  07/09/2003  Job:  [id number]   cc:   [facility]   Jonelle Sidle, M.D. [facility]

## 2011-04-03 NOTE — Discharge Summary (Signed)
NAME:  [name], [name]                           ACCOUNT NO.:  [id number]   MEDICAL RECORD NO.:  [id number]                   PATIENT TYPE:  INP   LOCATION:  0353                                 FACILITY:  [facility]   PHYSICIAN:  [name], M.D.                 DATE OF BIRTH:  10/26/1941   DATE OF ADMISSION:  01/30/2004  DATE OF DISCHARGE:  02/04/2004                                 DISCHARGE SUMMARY   ADMITTING DIAGNOSIS:  1. Osteoarthritis, right knee.  2. Status post left total knee replacement arthroplasty.  3. History of cerebrovascular accident in 1999, mini stroke.  4. Cerebral atherosclerotic vascular disease.  5. Hypertension.  6. Coronary arterial disease.  7. History of paroxysmal atrial fibrillation.  8. History of congestive heart failure.  9. Noninsulin-dependent diabetes mellitus.  10.      Chronic coumadinization.  11.      Obesity.  12.      History of hyperkalemia.  13.      History of hyponatremia.   DISCHARGE DIAGNOSES:  1. Osteoarthritis, right knee, status post right total knee arthroplasty.  2. Mild postoperative hyponatremia.  3. Mild postoperative hyperkalemia.  4. Cardiomyopathy with global hypokinesis of 21%.  5. Estimated ejection fraction of 35% to 40%.  6. Postoperative renal insufficiency.  7. Status post left total knee replacement arthroplasty.  8. History of cerebrovascular accident in 1999, mini stroke.  9. Cerebral atherosclerotic vascular disease.  10.      Hypertension.  11.      Coronary arterial disease.  12.      History of paroxysmal atrial fibrillation.  13.      History of congestive heart failure.  14.      Noninsulin-dependent diabetes mellitus.  15.      Chronic coumadinization.  16.      Obesity.  17.      History of hyperkalemia.  18.      History of hypernatremia.   PROCEDURE:  Date of surgery January 30, 2004 - right total knee arthroplasty.  Surgeon - Dr. Homero Fellers [name].  Assistant - [name], P.A.C.  Anesthesia -  general with a postoperative Marcaine pain pump.  Minimal blood loss.  Hemovac drain x1.  Tourniquet time of 49 minutes at 300 mmHg.   CONSULTS:  Cardiology - Dr. Rollene [name].   BRIEF HISTORY:  [name] is a 70 year old male with severe end-stage  arthritis of the right knee.  His pain has been refractory to nonoperative  management.  He has had a previous successful left total knee, and now  presents for the right side to be done, since he has failed nonoperative  management.   LABORATORY DATA:  CBC on admission revealed hemoglobin of 13.5, hematocrit  39.0, white cell count of 8.1, red cell count 4.31.  Differential all within  normal limits.  Serial hemoglobins and hematocrits were followed.  Postoperative  hemoglobin and hematocrit were 11.3 and 32.9.  The last-noted  hemoglobin and hematocrit were 9.9 and 28.9.  PT and PTT preoperatively  elevated at 20.3 and 49 respectively, and INR of 2.2.  Was on Coumadin  preoperatively.  Rechecked on the date of surgery - normal at 13.6 and INR  of 1.1.  Serum pro times were followed when he was placed back on his  Coumadin.  The last noted PT and INR were 16.3 and 1.5.  Chem panel on  admission revealed a low sodium of 132, elevated glucose of 217, elevated  BUN of 24.  The remaining chemistry panel all within normal limits.  Serial  BMET's were followed.  Potassium went from 5.2 up to 5.7 and back down to  4.0.  Sodium went from 132 down to 120 and back up to 132.  Glucose went up  to 270 and back down to 177.  BUN started at 24, crept up to 28.  B type  natruretic peptide less than 30.  Lipid profile - cholesterol level of 144.  Triglycerides elevated at 191.  HDL cholesterol low at 35.  Total  cholesterol/HDL ratio of 4.1, VLDL cholesterol 38, LDL cholesterol 71.  Urinalysis positive for glucose; otherwise negative.  Blood group type A  positive.   EKG dated January 31, 2004 revealed sinus rhythm with first degree AV block,  left  bundle branch block unconfirmed.  Chest x-ray, 2-view, on January 24, 2004, revealed stable chest, no active findings.   HOSPITAL COURSE:  The patient was admitted to [facility], taken to  the OR, and underwent the above-stated procedure without complication, and  tolerated the procedure well.  Later was transferred to the recovery room  and then to the orthopedic floor to continue postoperative care.  Vital  signs were followed.  The patient was given PCA analgesia for pain control  following surgery.  Also given p.o. medications.  Given 24-hour  postoperative antibiotics in the form of Ancef.  Started on Coumadin and  Lovenox postoperatively.  Lovenox until the Coumadin was therapeutic.  Started back on all of his home medications.  Weightbearing as tolerated.  PT and OT were consulted to assist with gait training, ambulation, and  activities of daily living postoperatively.  Hemovac drain placed at the  time of surgery was pulled on postoperative day #1.  The Glucophage was  initially held following surgery.  He did have elevated glucose  postoperative.  The dextrose was removed from the fluids.  He was placed on  sliding scale.  He also had some renal insufficiency with increased BUN and  creatinine postoperatively, along with the elevated hyperkalemia.  Due to  the elevated potassium, the Aldactone was held.  He did have a positive  fluid balance, and felt he may be a little dry centrally.  Due to everything  going on with his significant cardiac disease, a cardiology consult was  called.  The patient was seen in consultation by Dr. Rollene [name], who  felt the need to have daily I&O's and weights, strict following due to his  cardiomyopathy, and also his moderately reduced ejection fraction, and also  noted the increased creatinine.  His cardiac medications were adjusted.  Dr. Antoine [name] and the cardiology staff followed the patient very closely through  the hospital course.   By day #2, the patient was doing much better from a  pain standpoint.  The pain was under better control.  PCA's were  discontinued.  His BUN actually went up, although his creatinine did improve  a little bit.  He started working with the physical therapist.  He did have  some elevated temperatures postoperatively.  Due to the findings initially  after surgery, he was transferred to telemetry where he was monitored  throughout the hospital course.  PCA's were discontinued on postoperative  day #2.  He was encouraged to take p.o. medications.  By day #3, his volume  status was more stable.  He was starting to progress with physical therapy,  which was initiated postoperatively.  He was up ambulating with therapy, and  even progressed up to 80 feet later during the hospital course.  Dressing  changes were initiated on postoperative day #2.  The incision was healing  well.  That was also when the Marcaine pain pump was removed.  He did well  over the weekend, and by February 04, 2004, the patient had been up ambulating  very well, he had improved from a cardiac standpoint, blood pressure was  stable, progressing well, and it was decided the patient could be discharged  home.   DISCHARGE PLAN:  1. The patient discharged home on February 04, 2004.  2. Discharge diagnosis - please see above.   DISCHARGE MEDICATIONS:  1. Coumadin per protocol.  2. Percocet.  3. Robaxin.   DIET:  Cardiac diet.   ACTIVITY:  Weightbearing as tolerated on right lower extremity.  Home health  PT and home health nursing for total knee protocol.   FOLLOW UP:  1. Follow up 2 weeks from surgery.  Call the office for an appointment.  2. He is to follow up with Cardiology Services as instructed.   DISPOSITION:  Home.   CONDITION ON DISCHARGE:  Improving.     Alexzandrew L. Julien Girt, P.A.              [name], M.D.    ALP/MEDQ  D:  03/07/2004  T:  03/07/2004  Job:  [id number]   cc:   Jonelle Sidle, M.D.  [facility]   Nena Jordan   [name], M.D.   [facility]  [place], [employer] [place]

## 2011-04-03 NOTE — Op Note (Signed)
NAME:  [name], [name]                           ACCOUNT NO.:  [id number]   MEDICAL RECORD NO.:  [id number]                   PATIENT TYPE:  INP   LOCATION:  0006                                 FACILITY:  [facility]   PHYSICIAN:  Ollen Gross, M.D.                 DATE OF BIRTH:  May 29, 1941   DATE OF PROCEDURE:  01/30/2004  DATE OF DISCHARGE:                                 OPERATIVE REPORT   PREOPERATIVE DIAGNOSIS:  Osteoarthritis, right knee.   POSTOPERATIVE DIAGNOSIS:  Osteoarthritis, right knee.   PROCEDURE:  Right total knee arthroplasty.   SURGEON:  Gus Rankin. Aluisio, M.D.   ASSISTANT:  Avel Peace, P.A.   ANESTHESIA:  General with postop Marcaine pain pump.   ESTIMATED BLOOD LOSS:  Minimal.   DRAIN:  Hemovac x 1.   TOURNIQUET TIME:  49 minutes at 300 mmHg.   COMPLICATIONS:  None.   CONDITION:  Stable to recovery.   BRIEF CLINICAL NOTE:  Mr. [name] is a 70 year old male with severe end-stage  osteoarthritis of the right knee with pain refractory to nonoperative  management.  He has had a previous very successful left total knee and  presents now for right total knee secondary to failure of nonoperative  management.   PROCEDURE IN DETAIL:  After the successful administration of general  anesthetic, a tourniquet is placed high on the right thigh and right lower  extremity prepped and draped in the usual sterile fashion.  Extremity is  wrapped in Esmarch, knee flexed, and tourniquet inflated to 300 mmHg.  A  standard midline incision is made with a 10 blade through subcutaneous  tissue to the level of the extensor mechanism.  A fresh blade is used to  make a medial parapatellar arthrotomy, then the soft tissue over the  proximal and medial tibia is subperiosteally elevated to the joint line with  a knife and into the semimembranosus bursa with a Cobb elevator.  Soft  tissue over the proximal lateral tibia is also elevated with attention being  paid to avoiding the  patellar tendon on tibial tubercle.  The patella is  everted, knee flexed 90 degrees, ACL and PCL are removed.  Drill is used to  create a starting hole in the distal femur, and the canal is irrigated.  A 5-  degree right valgus alignment guide is placed and rotating off the posterior  condyles, rotation is marked and a block pinned to remove 10 mm off the  distal femur.  Distal femoral resection is made with an oscillating saw.  A  sizing block is placed, and size 5 is most appropriate.  Rotation is marked  off of the epicondylar axis.  Size 5 cutting block is placed and the  anterior and posterior cuts made.   Tibia is subluxed forward, and the menisci are removed.  Extramedullary  tibial alignment guide is placed, referencing proximally at the medial  aspect of the tibial tubercle and distally along the second metatarsal axis  and tibial crest.  The block is pinned to remove 10 mm off the nondeficient  lateral side.  Tibial resection is made with an oscillating saw.  Size 4 is  the most appropriate tibia, and the proximal tibia is prepared with the  modular drill and keel punch for a size 4.  Preparation of the femur is then  completed with the intercondylar and chamfer cuts.   Size 4 mobile bearing tibial trial, size 5 posterior stabilized femoral  trial and a size 5 10 mm posterior stabilized rotating platform insert trial  are placed.  Full extension is achieved with excellent varus and valgus  balance throughout full range of motion.  The patella is then everted,  thickness measured to be 26 mm, free-hand resection taken to 15 mm, 41  template placed; lug holes are drilled; trial patellar is  placed, and it  tracks normally.  The osteophytes are then removed off the posterior femur  with the trial in place.  All trials are then removed, and the cut bone  surfaces are prepared with pulsatile lavage.  Cement is mixed and once ready  for implantation, the size 4 mobile bearing tibial  tray, size 5 posterior  stabilized femur, and 41 patella are cemented into place, and the patella is  held with a clamp.  Trial 10 mm insert is placed and knee held in full  extension and all extruded cement removed.  Once the cement is fully  hardened, then the permanent 10 mm posterior stabilized rotating platform  insert is placed into the tibial tray.  The knee is reduced, then the wound  copiously irrigated with antibiotic solution.  Extensor mechanism is closed  over a Hemovac drain with interrupted #1 PDS.  Flexion against gravity is  135 degrees.  Tourniquet is released for a total tourniquet time of 49  minutes.  Subcu is closed with interrupted 2-0 Vicryl, subcuticular with  running 4-0 Monocryl.  The incision is cleaned and dried and Steri-Strips  and a bulky sterile dressing applied.  Drain is hooked to suction.  Prior to  the dressing the cast, a Marcaine pain pump was placed.  Once that is  completed and the pump is hooked up, the drain hooked to suction, and then a  bulky sterile dressing applied.  He is placed into a knee immobilizer,  awakened, and transported to recovery in stable condition.                                               Ollen Gross, M.D.    FA/MEDQ  D:  01/30/2004  T:  01/30/2004  Job:  [id number]

## 2011-04-03 NOTE — H&P (Signed)
NAME:  [name], [name]                           ACCOUNT NO.:  [id number]   MEDICAL RECORD NO.:  [id number]                   PATIENT TYPE:  INP   LOCATION:  0353                                 FACILITY:  [facility]   PHYSICIAN:  Ollen Gross, M.D.                 DATE OF BIRTH:  [birth year]   DATE OF ADMISSION:  01/30/2004  DATE OF DISCHARGE:  02/04/2004                                HISTORY & PHYSICAL   CHIEF COMPLAINT:  Right knee pain.   HISTORY OF PRESENT ILLNESS:  The patient is a 70 year old male well known to  Dr. Ollen Gross with a history of bilateral knee pain and osteoarthritis.  He has previously undergone a left total knee arthroplasty and has done  quite well in his rehab.  He now presents for the right knee.  He has had  continued right knee pain which has been refractory to nonoperative  management.  He now presents for a total knee replacement.  The patient  subsequently admitted to the hospital.   ALLERGIES:  CODEINE causes blood blisters.   CURRENT MEDICATIONS:  1. Glyburide 5 mg b.i.d.  2. Coumadin - stopped prior to surgery.  3. Glucophage 1000 mg twice a day.  4. Altace 10 mg twice a day.  5. Toprol-XL 50 mg p.o. q.a.m.  6. Norvasc 5 mg p.o. q.a.m.  7. Celebrex - stopped prior to surgery.  8. Multivitamin daily.  9. He is also taking Lovenox preoperative while he was off his Coumadin.   PAST MEDICAL HISTORY:  1. History of cerebral atherosclerotic vascular disease.  2. History of mini stroke in 1999.  3. Hypertension.  4. Coronary arterial disease.  5. Paroxysmal atrial fibrillation.  6. History of congestive heart failure.  7. Non-insulin-dependent diabetes mellitus.  8. Chronic coumadinization.  9. Obesity.  10.      Past history of hyperkalemia.  11.      History of postoperative hyponatremia.  12.      History of gallstones.   PAST SURGICAL HISTORY:  1. Two lung surgeries.  2. Cholecystectomy.  3. Cardiac catheterizations.  4. Vasectomy.  5. Left total knee arthroplasty.   SOCIAL HISTORY:  Married, works in Production designer, theatre/television/film.  Nonsmoker, no alcohol.  Has  two children.  His spouse will be assisting with his care after surgery.  One-story home with three or four steps leading onto his porch.   FAMILY HISTORY:  Father with a history of heart disease, diabetes, cancer,  and arthritis.  Mother with a history of heart disease, diabetes, and  arthritis.   REVIEW OF SYSTEMS:  GENERAL:  No fever, chills, or night sweats.  NEURO:  The patient has had mini strokes.  No seizures, syncope, paralysis.  No  residual paralysis from his mini stroke.  RESPIRATORY:  No shortness of  breath, productive cough, or hemoptysis.  CARDIOVASCULAR:  Significant for  heart disease with  atrial fibrillation, hypertension, coronary arterial  disease, and heart failure.  Denies any shortness of breath at rest.  No  orthopnea, no chest pain, no palpitations.  GI:  No nausea, vomiting,  diarrhea, constipation.  GU:  No dysuria, hematuria, dysuria.  MUSCULOSKELETAL:  Pertinent to that of the right knee found in the history  of present illness.   PHYSICAL EXAMINATION:  VITAL SIGNS:  Pulse 64, respirations 12, blood  pressure 142/82.  GENERAL:  A 70 year old white male well-nourished, well-developed, short in  stature, overweight.  Alert and oriented and cooperative.  HEENT:  Normocephalic, atraumatic.  Pupils round and reactive, oropharynx  clear.  EOMS are intact.  NECK:  Supple.  No carotid bruits are appreciated.  CHEST:  Clear anterior and posterior chest walls on auscultation with  somewhat of a barrel-chested anatomy.  HEART:  Regular rate and rhythm.  No murmur.  S1, S2 noted.  ABDOMEN:  Soft, round, protuberant abdomen.  Bowel sounds are present.  RECTAL, BREAST, GENITALIA:  Not done, not pertinent to present illness.  EXTREMITIES:  Right knee:  He does have a moderate varus deformity and  malalignment.  Positive crepitus is noted.  There is no  instability.  He is  moderately tender to palpation over the medial and lateral joint lines.   IMPRESSION:  1. Osteoarthritis right knee.  2. Status post left total knee replacement arthroplasty.  3. History of cerebrovascular accident in 1999 (mini stroke).  4. Cerebral atherosclerotic vascular disease.  5. Hypertension.  6. Coronary arterial disease.  7. Paroxysmal atrial fibrillation.  8. History of congestive heart failure.  9. Non-insulin-dependent diabetes mellitus.  10.      Chronic coumadinization.  11.      Obesity.  12.      History of hyperkalemia.  13.      History of hyponatremia.   PLAN:  The patient will be admitted to [facility] and undergo a  total knee arthroplasty, surgery performed by Dr. Ollen Gross.     Alexzandrew L. Julien Girt, P.A.              Ollen Gross, M.D.    ALP/MEDQ  D:  02/03/2004  T:  02/05/2004  Job:  [id number]   cc:   Jonelle Sidle, M.D. [facility]   Dr. Eliberto Ivory

## 2011-04-03 NOTE — Op Note (Signed)
NAME:  HARSHIL, CAVALLARO                           ACCOUNT NO.:  [id number]   MEDICAL RECORD NO.:  [id number]                   PATIENT TYPE:  INP   LOCATION:  X010                                 FACILITY:  [facility]   PHYSICIAN:  Ollen Gross, M.D.                 DATE OF BIRTH:  1941-07-12   DATE OF PROCEDURE:  05/28/2003  DATE OF DISCHARGE:                                 OPERATIVE REPORT   PREOPERATIVE DIAGNOSIS:  Osteoarthritis, left knee.   POSTOPERATIVE DIAGNOSIS:  Osteoarthritis, left knee.   PROCEDURE:  Left total knee arthroplasty.   SURGEON:  Gus Rankin. Aluisio, M.D.   ASSISTANT:  Alexzandrew L. Julien Girt, P.A.   ANESTHESIA:  Spinal.   ESTIMATED BLOOD LOSS:  Minimal.   DRAIN:  Hemovac x 1.   TOURNIQUET TIME:  57 minutes at 300 mmHg.   COMPLICATIONS:  None.   CONDITION:  Stable to recovery.   BRIEF CLINICAL NOTE:  Mr. Coleson is a 70 year old male with severe end-stage  osteoarthritis of his left knee with pain refractory to nonoperative  management.  He presents now for a left total knee arthroplasty.   PROCEDURE IN DETAIL:  After the successful administration of spinal  anesthetic, a tourniquet is placed high on the left thigh, left lower  extremity prepped and draped in the usual sterile fashion.  Extremity is  wrapped in Esmarch, knee flexed, tourniquet inflated to 300 mmHg.  Standard  midline incision is made with a 10 blade through the subcutaneous tissue to  the level of the extensor mechanism, and then a fresh blade is used to make  a medial parapatellar arthrotomy.  The soft tissue over the proximal and  medial tibia is subperiosteally elevated to the joint line with a knife and  at the semimembranous bursa with a curved osteotome.  Soft tissue on the  proximal and lateral tibia is also elevated with attention being paid to  avoiding the patellar tendon on the tibial tubercle.  The patella is  everted, knee flexed 90 degrees; ACL and PCL are removed.   The  drill is used to create a starting hole in the distal femur and the  canal irrigated.  A five degree left valgus alignment guide is placed and  referencing off the posterior condyle, rotation is marked and the block  pinned to remove 10 mm off the distal femur.  Distal femoral resection is  made with an oscillating saw.  The sizing block is placed.  Size 5 is most  appropriate.  Rotation corresponds with the epicondylar axis, and the AP  block is placed and anterior and posterior cuts made.   Tibia is then subluxed forward and the menisci removed.  Extramedullary  tibial alignment guide is placed, referencing proximally at the medial  aspect of the tibial tubercle and distally along the second metatarsal axis  tibial crest.  The block is pinned to  remove 10 mm off the nondeficient  lateral side.  Tibial resection is made with an oscillating saw.  The size 5  is the most appropriate tibia, and the proximal tibia is prepared with the  modular drill and keel punch.   The femur is then completed with the intercondylar and chamfer cuts.  Size 5  left posterior stabilized femur, size 5 mobile bearing trial tibia, and a 10  mm posterior stabilized rotating platform insert trial are placed.  With the  10, there is a tiny bit of varus and valgus play, and we went to a 12.5, and  full extension is achieved with excellent varus and valgus balance  throughout the full range of motion.  The patella is everted, thickness  measured to be 26 mm, with free hand resection taken to 14 mm.  The 41  template placed, the lug holes drilled, trial patella placed, and it tracks  normally.  Osteophytes are then removed off the posterior femur with the  trial in place.  All trials are then removed and the cut bone surfaces  prepared with pulsatile lavage.  The cement  is mixed and once ready for  implantation, the size 5 mobile bearing tibial tray, size 5 posterior  stabilized femur, and 41 patella are cemented  into place.  Patella is held  with a clamp.  The 12.5 mm trial insert is placed, knee held in full  extension, all extruded cement removed.  Once the cement is fully hardened,  then the permanent 12.5 mm posterior stabilized rotating platform insert is  placed.  Wound is then copiously irrigated with antibiotic solution,  extensor mechanism closed over a Hemovac drain with interrupted #1 PDS.  Tourniquet is released for a total time of 57 minutes and minor bleeding  stopped with cautery.  Flexion against gravity is 140 degrees.  Subcu is  closed with interrupted 2-0 Vicryl, subcuticular with running 4-0 Monocryl.  The incision is clean and dry and Steri-Strips and a bulky sterile dressing  applied.  He is then awakened and transported to recovery in stable  condition.                                               Ollen Gross, M.D.    FA/MEDQ  D:  05/28/2003  T:  05/28/2003  Job:  [id number]

## 2011-04-03 NOTE — Consult Note (Signed)
   NAME:  [name], [name] NO.:  [id number]   MEDICAL RECORD NO.:  [id number]                   PATIENT TYPE:  INP   LOCATION:  0482                                 FACILITY:  [facility]   PHYSICIAN:  Maple Mirza, P.A.              DATE OF BIRTH:  [birth year]   DATE OF CONSULTATION:  DATE OF DISCHARGE:                                   CONSULTATION   NO DICTATION                                               Maple Mirza, P.A.    GM/MEDQ  D:  05/29/2003  T:  05/29/2003  Job:  [id number]

## 2011-04-03 NOTE — Consult Note (Signed)
NAME:  [name], [name]                           ACCOUNT NO.:  [id number]   MEDICAL RECORD NO.:  [id number]                   PATIENT TYPE:  INP   LOCATION:  0471                                 FACILITY:  [facility]   PHYSICIAN:  Rollene Rotunda, M.D.                DATE OF BIRTH:  1941/02/15   DATE OF CONSULTATION:  01/31/2004  DATE OF DISCHARGE:                                   CONSULTATION   REFERRED BY:  Ollen Gross, M.D.   PRIMARY CARE PHYSICIAN:  Weyman Pedro, M.D.   CARDIOLOGIST:  Jonelle Sidle, M.D.   REASON FOR CONSULTATION:  Evaluate patient with heart failure, increased  creatinine status post right total knee replacement.   HISTORY OF PRESENT ILLNESS:  The patient is a pleasant 70 year old gentleman  well known to our practice and Dr. Diona Browner. He has a history of a  cardiomyopathy with a previous global hypokinesis of 21% but no high grade  obstructive coronary disease (see below). His most recent echo that we have  access to demonstrates his EF is approximately 35-40%.  He reports that he  has done very well from a cardiovascular standpoint.  He is a long Furniture conservator/restorer and denies any symptoms such as shortness of breath, PND or  orthopnea.  He has no lower extremity swelling.  He denies any chest  discomfort, neck discomfort, arm discomfort, activity induced nausea,  vomiting or excessive diaphoresis.  In the past, he has had atrial  fibrillation which caused weakness.  He was apparently cardioverted from  this but has had no recurrent symptoms and does not feel any palpitations.  He tolerates his medications as listed below.   He is now status post left total knee replacement. He is asymptomatic from a  cardiovascular standpoint at this point, however, he is slightly hypotensive  now a systolic blood pressure of 100.  Also he was noted to have a  creatinine of 2.0 with his baseline seven days ago being 1.3.   PAST MEDICAL HISTORY:  Nonischemic  cardiomyopathy, nonobstructive coronary  disease (catheterization 2001), anteroapical akinesis, 40% followed by 40%  LAD stenosis, 25% circumflex stenosis, 50 followed 40 followed by 30  followed by 60% RCA stenosis), paroxysmal atrial fibrillation status post  discontinue cardioversion 2001, diabetes mellitus type 2, hypertension,  history of TIA's.   PAST SURGICAL HISTORY:  Left total knee replacement July 2004,  cholecystectomy, remote lung surgery.   ALLERGIES:  CODEINE.   MEDICATIONS:  1. Dilaudid.  2. Insulin.  3. Lovenox 30 mg q. 12 hours.  4. Coumadin.  5. Glyburide 5 mg b.i.d.  6. Glucophage 1000 mg b.i.d.  7. Toprol XL 50 mg q.d.  8. Altace 10 mg b.i.d.  9. Norvasc 5 mg q.d.  10.      Ancef.   SOCIAL HISTORY:  The patient does not drink. He does have an 80 pack year  smoking history but quit five years ago. He is a Naval architect.   FAMILY HISTORY:  Noncontributory for early coronary artery disease.   REVIEW OF SYMPTOMS:  As stated in the HPI and negative for all other  symptoms.   PHYSICAL EXAMINATION:  GENERAL:  The patient is in no distress.  VITAL SIGNS:  Blood pressure 100/68, heart rate 90 and regular, afebrile.  HEENT:  Eyes unremarkable, pupils equal round and reactive to light, fundi  not visualized.  Oral mucosa unremarkable.  NECK:  A 7 cm jugular venous distention at 45 degrees, carotid upstroke  brisk and symmetric, no bruits or thyromegaly.  LYMPHATICS:  No cervical, axillary or inguinal adenopathy.  LUNGS:  Clear to auscultation without crackles or wheezing.  BACK:  No costovertebral angle tenderness.  CHEST:  Unremarkable.  HEART:  PMI not displaced or sustained, S1 and S2 within normal limits, no  S3, no S4, no murmurs.  ABDOMEN:  Obese, positive bowel sounds, normal in frequency and pitch, no  bruits, rebound, guarding, no midline pulsatile mass, hepatomegaly,  splenomegaly.  SKIN:  No rashes, no nodules.  EXTREMITIES:  2+ pulses throughout,  edema.  NEUROLOGIC:  Oriented to person, place,and time.  Cranial nerves II-XII  gross intact, motor grossly intact.   EKG (January 18, 2004), sinus rhythm, rate 67, axis within normal limits and  her septal infarct age undetermined.  Anterolateral T wave inversions  without old EKG for comparison.   Chest x-ray stable with no cardiomegaly and no evidence of edema.   LABORATORY DATA:  Sodium 133, potassium 5.7, BUN 25, creatinine 2.0  (baseline creatinine 1.3).   ASSESSMENT/PLAN:  1. Cardiomyopathy.  The patient has a cardiomyopathy with a mild to     moderately reduced ejection fraction.  He has class 1 symptoms. Today he     appears to be euvolemic by physical though he is positive on his fluid     balance.  He is slightly hypotensive.  I will not give him more fluid. He     was given one dose of diuretic and I will hold diuretics further after     this.  Will need to assess his volume status daily with strict I's and     O's and weights as well as physical exam.  2. Increased creatinine.  Make sure of the etiology of this.  He does not     appear to be overly dry so I am not sure that this is prerenal. He has     some medications that could affect this. I do agree with reducing his     Altace to 5 mg b.i.d.  I would like to try to titrate the beta blocker if     his pressure tolerates before discharge. Will need to follow serial     creatinines.  If his creatinine remains stable at this level, I would     continue the ACE.  If it continues to rise, this would need to be     discontinued.  3. Abnormal EKG.  He is having no anginal symptoms.  I am not clear as to     whether this represents any new change. He is going to be put on     telemetry and followed. Will get his old EKG's.  Will also get his most     recent echocardiogram from [facility].  Rollene Rotunda, M.D.    JH/MEDQ  D:  01/31/2004  T:  02/02/2004  Job:   [id number]   cc:   [facility] [place], [place]   Dr. Eliberto Ivory

## 2011-04-03 NOTE — Cardiovascular Report (Signed)
[facility]  Patient:    CLABORN, JANUSZ                        MRN: [id number] Proc. Date: 02/16/00 Adm. Date:  [id number] Attending:  Ronaldo Miyamoto CC:         Weyman Pedro, M.D.             Daisey Must, M.D. [facility]             [facility] [place]             Cardiac Catheterization Laboratory                        Cardiac Catheterization  PROCEDURES PERFORMED:  Left heart catheterization with coronary angiography and  left ventriculography.  INDICATIONS:  Mr. Eisenbeis is a 70 year old male, who presented to the hospital with atrial fibrillation and congestive heart failure.  A 2-D echocardiogram showed severely decreased left ventricular systolic function.  We opted to proceed with cardiac catheterization to rule out significant coronary artery disease.  DESCRIPTION OF PROCEDURE:  A 6 French sheath was placed in the right femoral artery.  Standard Judkins 6 French catheters were utilized.  Contrast was Omnipaque.  There were no complications.  RESULTS:  HEMODYNAMICS:  Left ventricular pressure 100/18.  Aortic pressure 100/60.  No aortic valve gradient.  LEFT VENTRICULOGRAM:  There is severe global left ventricular hypokinesis with akinesis of anterior apical wall.  Ejection fraction is calculated at 21%. There is trace to 1+ mitral regurgitation.  CORONARY ARTERIOGRAPHY:  (Right dominant).  Left main:  Normal.  Left anterior descending:  The LAD has a 40% stenosis in the proximal vessel and a 40% stenosis in the distal vessel.  It gives rise to a small first diagonal and a normal sized second diagonal.  Left circumflex:  The left circumflex coronary has a 25% stenosis in the mid vessel.  It gives rise to a large ramus intermedius, small OM-1, a large OM-2, which has a diffuse less than 20% stenosis and small OM-3.  Right coronary artery:  The right coronary artery has a tubular 50% stenosis in the proximal vessel.  The  mid vessel has a 40%, distal has a 30% followed by a 60% stenosis.  Beyond the posterior descending artery is another 30% stenosis in the continuation of the right coronary artery.  There is a small posterior descending artery, two small posterolateral branches and a large third posterolateral branch.  IMPRESSION: 1. Severely decreased left ventricular systolic function secondary to    nonischemic cardiomyopathy. 2. Moderate but not obstructive coronary artery disease. DD:  02/16/00 TD:  02/16/00 Job: [id number]

## 2011-04-03 NOTE — H&P (Signed)
NAME:  [name], [name]                           ACCOUNT NO.:  [id number]   MEDICAL RECORD NO.:  [id number]                   PATIENT TYPE:  INP   LOCATION:  NA                                   FACILITY:  [facility]   PHYSICIAN:  [name], M.D.                 DATE OF BIRTH:  06/08/1941   DATE OF ADMISSION:  05/27/2002  DATE OF DISCHARGE:                                HISTORY & PHYSICAL   DATE OF OFFICE VISIT HISTORY AND PHYSICAL:  May 24, 2003.   CHIEF COMPLAINT:  Left knee pain.   HISTORY OF PRESENT ILLNESS:  The patient is a 70 year old male who has been  seen by Dr. Ollen [name] for bilateral knee pain.  At present the left knee  is more symptomatic and problematic than his right knee.  He has had  progressive problems over the past while in both knees.  They have gotten to  the point where they are hurting with any kind of activity or mobility, and  he even has pain at rest.  He has had a previous knee scope by Dr. Hilda [name]  back in 1995 for a torn meniscus.  It did help out for a little while, but  since that time the pain has gotten progressively worse.  It is to a point  now where it is hurting him all the time.  He is currently on disability  secondary to history of ministrokes.  He has not had any residual from  those.  He is at a point now where he would like to have something done  about his knee pain.  He was seen in the office where x-rays show bone-on-  bone changes in the medial compartment with large lateral marginal  osteophytes, patellofemoral osteophytes on the left.  The right knee does  show some medial joint space narrowing, otherwise, normal lateral and  patellofemoral compartments.  It is felt he would benefit from undergo knee  replacement on the left.  The risks and benefits of this procedure have been  discussed with the patient at length.  He has elected to proceed with  surgery.   ALLERGIES:  CODEINE caused blood blisters.   CURRENT MEDICATIONS:  1. Glyburide 5 mg twice a day.  2. Coumadin daily, stopped prior to surgery.  3. Glucophage 100 mg twice a day.  4. Altace 10 mg twice a day.  5. Toprol-XL 50 mg q.a.m.  6. Aldactone 25 mg q.a.m.  7. Norvasc 5 mg q.a.m.  8. Celebrex 200 mg q.a.m., stopped prior to surgery.   PAST MEDICAL HISTORY:  1. History of ministroke in 1999.  2. Cerebral atherosclerotic disease.  3. Atrial fibrillation.  4. Hypertension.  5. Coronary arterial disease.  6. History of congestive heart failure.  7. History of gallstones.  8. Non-insulin-dependent diabetes mellitus.  9. Arthritis.    PAST SURGICAL HISTORY:  1. He  has had two lung surgeries secondary to what sounds like chemical     exposures.  2. Cholecystectomy.  3. Cardiac catheterizations.   SOCIAL HISTORY:  Married.  Works in Production designer, theatre/television/film.  Nonsmoker.  No alcohol.  Has two children.  His spouse and son will be assisting with care after  surgery.  One-story home.  He has four steps leading up onto his porch.   FAMILY HISTORY:  Father with a history of heart disease, diabetes, cancer,  and arthritis.  Mother with a history of heart disease, diabetes, and  arthritis.   REVIEW OF SYSTEMS:  GENERAL:  No fevers, chills, or night sweats.  NEUROLOGIC:  The patient has had ministrokes.  No seizures, syncope.  No  residual paralysis from his ministroke.  RESPIRATORY:  No shortness of  breath, productive cough, or hemoptysis.  CARDIOVASCULAR:  Significant heart  disease with atrial fibrillation, hypertension, coronary arterial disease,  and heart failure.  He denies any shortness of breath at rest.  No  orthopnea.  No chest pain.  No palpitations.  GASTROINTESTINAL:  No nausea,  vomiting, diarrhea, or constipation.  GENITOURINARY:  No dysuria, hematuria,  or discharge.  MUSCULOSKELETAL:  Pertinent to that of the left knee found in  the history of present illness.   PHYSICAL EXAMINATION:  VITAL SIGNS:  Pulse 68, respirations 14, blood   pressure 132/82.  GENERAL:  The patient is a 71 year old white male.  Well-nourished, well-  developed, short in stature, overweight.  Alert, oriented, and cooperative.  HEENT:  Normocephalic, atraumatic.  Pupils round and reactive.  Oropharynx  clear.  NECK:  Supple.  No carotid bruits are appreciated.  CHEST:  The patient has somewhat of a barrel-chested anatomy.  The chest is  clear to auscultation anterior and posterior chest walls.  HEART:  Regular rate and rhythm.  No murmur.  S1, S2 noted.  ABDOMEN:  Soft, round, protuberant abdomen.  Bowel sounds are present.  RECTAL, BREASTS, GENITALIA:  Not done.  Not pertinent to present illness.  EXTREMITIES:  Limited to that of the left lower extremity.  The left knee  shows a slight varus deformity malalignment with range of motion of 5-120  degrees with moderate crepitus noted.  The right knee shows better range of  motion of 0-125 degrees with slight crepitus.  No instability.   IMPRESSION:  1. Bilateral knee osteoarthritis, left more symptomatic than right.  2. History of cerebrovascular accident 40 (ministroke).  3. Cerebral atherosclerotic vascular disease.  4. Hypertension.  5. Coronary arterial disease.  6. History of atrial fibrillation.  7. History of congestive heart failure.  8. Non-insulin-dependent diabetes mellitus.  9. Chronic coumadinization.   PLAN:  The patient is admitted to [facility] to undergo a left  total knee replacement arthroplasty.  The surgery will be performed by Dr.  Ollen [name].  The patient's heart doctor is Dr. Diona [name] of [facility].  Dr. Diona [name] will be consulted if needed for any medical assistance  with this patient throughout his hospital  course.  His medical doctor is Dr. Weyman [name], and his neurologist is Dr.  Sandria [name].  Dr. Eliberto [name] and Dr. Sandria [name] will be notified of the patient's room number  on admission and will be consulted if needed for any assistance with this patient  throughout the hospital course.     [name], P.A.              [name], M.D.    ALP/MEDQ  D:  05/27/2003  T:  05/27/2003  Job:  [id number]   cc:   [name], M.D.   Jonelle Sidle, M.D. [facility]   Genene Churn. Love, M.D.  [address]  [address]  [place]  [place] [address]  Fax: [phone]

## 2011-04-03 NOTE — Discharge Summary (Signed)
. [facility]  Patient:    [name], [name]                        MRN: [id number] Adm. Date:  [id number] Disc. Date: 02/19/00 Attending:  Ronaldo Miyamoto Dictator:   Lavella Hammock, P.A. CC:         Weyman Pedro, M.D.             Daisey Must, M.D. [facility]                           Discharge Summary  DATE OF BIRTH:  09-07-1941  PROCEDURES: 1. Cardiac catheterization. 2. Coronary arteriogram. 3. Left ventriculogram. 4. Transesophageal echocardiogram with direct current cardioversion.  HOSPITAL COURSE:  Mr. Masur is a 70 year old male with no known coronary artery disease who was admitted to [facility] on February 10, 2000, for weakness and chest tightness.  He was evaluated by cardiology and had an echocardiogram which showed significant left ventricular dysfunction as well as atrial fibrillation with rapid ventricular response.  He was already on Coumadin for peripheral vascular disease and a TIA history and his INR on admission was 2.0.  He was transferred to [facility] for further evaluation.  His Coumadin was held secondary to possible cardiac catheterization and he was gradually transitioned from Cardizem to p.o. Lopressor with good control of his heart rate.  He had a catheterization February 16, 2000, once his INR fell below therapeutic levels which showed a normal left main, an LAD with two 40% lesions and a circumflex with two 20-25% lesions.  The RCA had a 50% proximal stenosis, a 40% mid and a 30% stenosis distally as well as 60% stenosis distally.  His EF was 21% with 1+ MR.  It was felt that he had a nonischemic cardiomyopathy and was scheduled for TEE cardioversion.  He had a TEE which showed left atrium left atrial appendage, right atrium and right atrial appendage without masses or clot although there was spontaneous contrast noted in the left atrium which suggested a tiny patent foramen ovale although  it did not show with injection of isotonic saline with abnormal pressure.  He also had mild adverse sclerotic plaqueing of his thoracic aorta. He was cardioverted with 260 joules x 2 and converted to sinus rhythm. He was started back on his Coumadin.  By February 19, 2000, he was maintaining sinus rhythm and feeling much better. His INR was 1.6 and so he was continued on Lovenox as well as being restarted on his Coumadin.  By February 20, 2000, he was considered stable for discharge.  LABORATORY VALUES:  Sodium 136, potassium 4.1, chloride 102, CO2 29, BUN 23, creatinine 1.1, glucose 78.  INR at discharge 1.6.  Hemoglobin 13.5, hematocrit 38.6, WBC 7.6, platelets 229.  TSH was borderline abnormal and a free T4 was ordered at [facility] [employer] and those results are not available at the time of this dictation.  CONDITION ON DISCHARGE:  Improved.  CONSULTS:  None.  COMPLICATIONS:  None.  DISCHARGE DIAGNOSES: 1. Nonischemic cardiomyopathy with an EF of 21% by catheterization and 20% by    echo this admission. 2. Atrial fibrillation with rapid ventricular response, sinus rhythm after    DCCV. 3. Hypertension. 4. Unknown lipid status. 5. Adult onset diabetes. 6. Obesity. 7. History of allergies to PENICILLIN and CODEINE. 8. Borderline abnormal TSH with T4 checked at  [facility] 9. Chronic anticoagulation with an INR of 1.6 at discharge and 3 days of    Lovenox.  DISCHARGE INSTRUCTIONS:  ACTIVITY:  His activity level is to increase gradually.  DIET:  He is to stick to a low fat diabetic diet.  WOUND CARE:  He is to call the office for bleeding, swelling or drainage at the catheterization site.  He is not to take Captopril.  FOLLOWUP  He is to get a pro time at Dr. Gretta Began office on Friday and Monday. He is to follow up with Dr. Eliberto Ivory and call for an appointment.  He has an appointment with Dr. Gerri Spore on March 02, 2000, at 10:45 a.m.  DISCHARGE MEDICATIONS: 1.  Over-the-counter cortisone cream p.r.n. 2. Coumadin 7.5 mg Monday, Wednesday, and Friday with 5 mg other days. 3. Lovenox 100 mg b.i.d. for 3 days. 4. Glucophage 500 mg b.i.d. may restart today. 5. Altace 5 mg b.i.d. 6. Aldactone 25 mg q. day. 7. Lopressor 100 mg b.i.d. 8. Glyburide 5 mg b.i.d. 9. Vioxx p.r.n. DD:  02/19/00 TD:  02/19/00 Job: [id number]

## 2011-04-06 ENCOUNTER — Telehealth: Payer: Self-pay | Admitting: Internal Medicine

## 2011-04-06 NOTE — Telephone Encounter (Signed)
Per pt calling - status of conversation with dr. love

## 2011-04-06 NOTE — Telephone Encounter (Signed)
Have not heard back from Dr Sandria Manly  Will try again today thanks

## 2011-04-06 NOTE — Telephone Encounter (Signed)
LMTC

## 2011-04-06 NOTE — Telephone Encounter (Signed)
Will review with Dr. Klein. 

## 2011-04-07 NOTE — Telephone Encounter (Signed)
Pt calling back re message yesterday, checking to see if dr Graciela Husbands spoke to dr love-pls call [phone]

## 2011-04-08 NOTE — Telephone Encounter (Signed)
I spoke with the patient and he is aware that Dr. Graciela Husbands is trying to speak with Dr. Sandria Manly. I will check with Dr. Graciela Husbands again tomorrow to see if he was able to talk with Dr. Sandria Manly and call the pt back. He is agreeable.

## 2011-04-09 NOTE — Telephone Encounter (Signed)
Will forward to Dr. Klein. 

## 2011-04-20 ENCOUNTER — Telehealth: Payer: Self-pay | Admitting: Internal Medicine

## 2011-04-20 NOTE — Telephone Encounter (Signed)
Pt still has not heard anything about getting his defib placed

## 2011-04-20 NOTE — Telephone Encounter (Signed)
Per Dr. Graciela Husbands, he will try to call the patient today.

## 2011-04-21 NOTE — Telephone Encounter (Signed)
Per Dr. Graciela Husbands, ok to schedule for ICD insertion. He has spoken with the patient.

## 2011-04-27 ENCOUNTER — Telehealth: Payer: Self-pay | Admitting: Internal Medicine

## 2011-04-27 ENCOUNTER — Other Ambulatory Visit: Payer: Self-pay | Admitting: *Deleted

## 2011-04-27 MED ORDER — FELODIPINE ER 10 MG PO TB24: 10.0000 mg | ORAL_TABLET | Freq: Every day | ORAL | Status: DC

## 2011-04-27 NOTE — Telephone Encounter (Signed)
Scheduled patient for 05/06/11 at 1;00pm  To be at the hospital at 11:00am  Pt aware and will have pre-procedure labs here on 04/29/11 at 10:30

## 2011-04-27 NOTE — Telephone Encounter (Signed)
Pt calling re scheduling a procedure, was told last week the nurse would call and he hasn't heard,wanted to fu, ok to leave message

## 2011-04-28 NOTE — Telephone Encounter (Signed)
Per Anselm Pancoast, she spoke with this patient yesterday and scheduled him for an ICD on 05/06/11.

## 2011-04-29 ENCOUNTER — Other Ambulatory Visit (INDEPENDENT_AMBULATORY_CARE_PROVIDER_SITE_OTHER): Payer: Medicare HMO | Admitting: *Deleted

## 2011-04-29 ENCOUNTER — Encounter: Payer: Self-pay | Admitting: *Deleted

## 2011-04-29 DIAGNOSIS — I429 Cardiomyopathy, unspecified: Secondary | ICD-10-CM

## 2011-04-29 DIAGNOSIS — I5022 Chronic systolic (congestive) heart failure: Secondary | ICD-10-CM

## 2011-04-29 LAB — CBC WITH DIFFERENTIAL/PLATELET
Basophils Absolute: 0.1 10*3/uL (ref 0.0–0.1)
Eosinophils Absolute: 0.5 10*3/uL (ref 0.0–0.7)
Eosinophils Relative: 7.3 % — ABNORMAL HIGH (ref 0.0–5.0)
HCT: 31.7 % — ABNORMAL LOW (ref 39.0–52.0)
Lymphs Abs: 1.1 10*3/uL (ref 0.7–4.0)
MCHC: 33.6 g/dL (ref 30.0–36.0)
MCV: 86.3 fl (ref 78.0–100.0)
Monocytes Absolute: 0.6 10*3/uL (ref 0.1–1.0)
Platelets: 257 10*3/uL (ref 150.0–400.0)
RDW: 16.3 % — ABNORMAL HIGH (ref 11.5–14.6)

## 2011-04-29 LAB — BASIC METABOLIC PANEL
BUN: 26 mg/dL — ABNORMAL HIGH (ref 6–23)
CO2: 25 mEq/L (ref 19–32)
Chloride: 100 mEq/L (ref 96–112)
Glucose, Bld: 248 mg/dL — ABNORMAL HIGH (ref 70–99)
Potassium: 4.5 mEq/L (ref 3.5–5.1)

## 2011-04-30 ENCOUNTER — Encounter: Payer: Self-pay | Admitting: *Deleted

## 2011-05-06 ENCOUNTER — Ambulatory Visit (HOSPITAL_COMMUNITY)
Admission: RE | Admit: 2011-05-06 | Discharge: 2011-05-07 | Disposition: A | Discharge status: Home / Self Care | Payer: Medicare HMO | Source: Ambulatory Visit | Attending: Internal Medicine | Admitting: Internal Medicine

## 2011-05-06 ENCOUNTER — Ambulatory Visit (HOSPITAL_COMMUNITY): Payer: Medicare HMO

## 2011-05-06 DIAGNOSIS — Z8673 Personal history of transient ischemic attack (TIA), and cerebral infarction without residual deficits: Secondary | ICD-10-CM | POA: Insufficient documentation

## 2011-05-06 DIAGNOSIS — I509 Heart failure, unspecified: Secondary | ICD-10-CM

## 2011-05-06 DIAGNOSIS — I5022 Chronic systolic (congestive) heart failure: Secondary | ICD-10-CM | POA: Insufficient documentation

## 2011-05-06 DIAGNOSIS — Z79899 Other long term (current) drug therapy: Secondary | ICD-10-CM | POA: Insufficient documentation

## 2011-05-06 DIAGNOSIS — Z7982 Long term (current) use of aspirin: Secondary | ICD-10-CM | POA: Insufficient documentation

## 2011-05-06 DIAGNOSIS — I2589 Other forms of chronic ischemic heart disease: Secondary | ICD-10-CM | POA: Insufficient documentation

## 2011-05-06 DIAGNOSIS — E119 Type 2 diabetes mellitus without complications: Secondary | ICD-10-CM | POA: Insufficient documentation

## 2011-05-06 DIAGNOSIS — N400 Enlarged prostate without lower urinary tract symptoms: Secondary | ICD-10-CM | POA: Insufficient documentation

## 2011-05-06 DIAGNOSIS — Z01818 Encounter for other preprocedural examination: Secondary | ICD-10-CM | POA: Insufficient documentation

## 2011-05-06 DIAGNOSIS — I4891 Unspecified atrial fibrillation: Secondary | ICD-10-CM | POA: Insufficient documentation

## 2011-05-06 DIAGNOSIS — N182 Chronic kidney disease, stage 2 (mild): Secondary | ICD-10-CM | POA: Insufficient documentation

## 2011-05-06 DIAGNOSIS — I428 Other cardiomyopathies: Secondary | ICD-10-CM

## 2011-05-06 DIAGNOSIS — Z96659 Presence of unspecified artificial knee joint: Secondary | ICD-10-CM | POA: Insufficient documentation

## 2011-05-06 DIAGNOSIS — E785 Hyperlipidemia, unspecified: Secondary | ICD-10-CM | POA: Insufficient documentation

## 2011-05-06 DIAGNOSIS — I059 Rheumatic mitral valve disease, unspecified: Secondary | ICD-10-CM | POA: Insufficient documentation

## 2011-05-06 DIAGNOSIS — Z794 Long term (current) use of insulin: Secondary | ICD-10-CM | POA: Insufficient documentation

## 2011-05-06 DIAGNOSIS — I2510 Atherosclerotic heart disease of native coronary artery without angina pectoris: Secondary | ICD-10-CM | POA: Insufficient documentation

## 2011-05-06 DIAGNOSIS — I447 Left bundle-branch block, unspecified: Secondary | ICD-10-CM | POA: Insufficient documentation

## 2011-05-06 DIAGNOSIS — Z01812 Encounter for preprocedural laboratory examination: Secondary | ICD-10-CM | POA: Insufficient documentation

## 2011-05-06 HISTORY — PX: OTHER SURGICAL HISTORY: SHX169

## 2011-05-06 LAB — GLUCOSE, CAPILLARY: Glucose-Capillary: 157 mg/dL — ABNORMAL HIGH (ref 70–99)

## 2011-05-07 ENCOUNTER — Ambulatory Visit (HOSPITAL_COMMUNITY): Payer: Medicare HMO

## 2011-05-14 DIAGNOSIS — I959 Hypotension, unspecified: Secondary | ICD-10-CM

## 2011-05-19 NOTE — Discharge Summary (Signed)
NAME:  [name], [name] NO.:  [id number]  MEDICAL RECORD NO.:  [id number]  LOCATION:  3731                         FACILITY:  [facility]  PHYSICIAN:  Duke Salvia, MD, FACCDATE OF BIRTH:  [birth year]  DATE OF ADMISSION:  05/06/2011 DATE OF DISCHARGE:  05/07/2011                              DISCHARGE SUMMARY   PRIMARY CARDIOLOGIST:  Jonelle Sidle, MD  ELECTROPHYSIOLOGIST:  Hillis Range, MD, in [facility] DIAGNOSIS:  Ischemic cardiomyopathy.  SECONDARY DIAGNOSES: 1. Chronic systolic congestive heart failure. 2. Coronary artery disease. 3. Hyperlipidemia. 4. Chronic left bundle-branch block. 5. Paroxysmal atrial fibrillation. 6. Mild chronic renal insufficiency. 7. Type 2 diabetes mellitus. 8. History of transient ischemic attack. 9. Benign prostatic hypertrophy. 10.Status post cholecystectomy. 11.Status post total knee arthroplasty. 12.Status post vasectomy. 13.History of lung surgery.  ALLERGIES: 1. ACE INHIBITORS cause hyperkalemia. 2. CODEINE cause blisters.  PROCEDURES:  Successful placement of a Medtronic Protecta XT CRT-D, model D314TRG, serial number [id number] biventricular automatic implantable cardioverter-defibrillator.  HISTORY OF PRESENT ILLNESS:  This is a 70 year old male with history ischemic cardiomyopathy, chronic systolic congestive heart failure as well as left bundle-branch block who was recently seen in clinic by Dr. Graciela Husbands for consideration of ICD placement.  Because the patient has a history of brain aneurysm with requirement for MRI, placement was initially put on hold until discussion could to be had with Neurology. Following such discussions, the patient was arranged for outpatient placement of his ICD.  HOSPITAL COURSE:  The patient presented to the [facility] on May 06, 2011, where he underwent successful placement of Medtronic Protecta XT CRT-D biventricular ICD.  The patient tolerated this procedure  well and postprocedure interrogation shows normal device function. Postprocedure, chest x-ray shows no evidence of pneumothorax.  He will be discharged home today in good condition.  DISCHARGE LABORATORY DATA:  None.  DISPOSITION:  The patient is being discharged home today in good condition.  FOLLOWUP PLANS AND APPOINTMENTS:  We arranged for followup at [facility] in our [place] office on May 25, 2011, at 12 p.m.  He will have a basic metabolic panel evaluated at that time as well.  He will follow up with Dr. Nona Dell on June 15, 2011, at 9:40 a.m. in our [place] office and with Dr. Johney Frame in approximately 3 months in our [place] office.  DISCHARGE MEDICATIONS: 1. Hydrocodone/APAP 5/325 mg q.4 h. p.r.n. site pain. 2. Carvedilol 12.5 mg b.i.d. 3. Aspirin 325 mg daily. 4. Felodipine 10 mg q.p.m. 5. Humalog 4-5 units q.a.m. 6. Lantus 16-20 units q.a.m. 7. Metanx 01/15/34 mg b.i.d. 8. Metformin 500 mg daily. 9. Plavix 75 mg daily. 10.Tylenol 650 mg b.i.d. 11.Vitamin B6 50 mg q.p.m. 12.Zinc 50 mg daily.  OUTSTANDING LABORATORY DATA AND STUDIES:  Followup [facility] on May 25, 2011.  DURATION OF DISCHARGE ENCOUNTER:  35 minutes including physician time.     Nicolasa Ducking, ANP   ______________________________ Duke Salvia, MD, [facility]    CB/MEDQ  D:  05/07/2011  T:  05/08/2011  Job:  [id number]  Electronically Signed by Nicolasa Ducking ANP on 05/08/2011 02:34:48 PM Electronically Signed by Sherryl Manges MD [facility]  on 05/19/2011 04:34:58 PM

## 2011-05-19 NOTE — Op Note (Signed)
NAME:  LAVELL, SUPPLE NO.:  [id number]  MEDICAL RECORD NO.:  [id number]  LOCATION:  3731                         FACILITY:  [facility]  PHYSICIAN:  Duke Salvia, MD, FACCDATE OF BIRTH:  1941-07-09  DATE OF PROCEDURE:  05/06/2011 DATE OF DISCHARGE:                              OPERATIVE REPORT   PREOPERATIVE DIAGNOSES: 1. Nonischemic cardiomyopathy. 2. Class II congestive failure. 3. Left bundle-branch block persistent despite months of guideline     directed pharmacological therapy.  POSTOPERATIVE DIAGNOSIS: 1. Nonischemic cardiomyopathy. 2. Class II congestive failure. 3. Left bundle-branch block persistent despite months of guideline     directed pharmacological therapy.  PROCEDURES:  Dual-chamber defibrillator implantation with left ventricular lead placement.  Following obtaining informed consent, the patient was brought to the electrophysiology laboratory and placed on the fluoroscopic table in supine position after routine prep and drape of the left upper chest, lidocaine was infiltrated in prepectoral subclavicular region.  An incision was made and carried down to the layer of the prepectoral fascia using electrocautery and sharp dissection, a pocket was formed similarly.  Hemostasis was obtained.  Thereafter attention was turned gaining access to the left subclavian vein which was accomplished with mild difficulty but without the aspiration of air or puncture of the artery.  Because of the difficulty, it ended up doubly  in one of the wires.  Over one of these doubled wires, I placed a 9-French sheath, through which was then passed a Medtronic 6947 dual core defibrillator lead serial number [id number] manipulated through right ventricular apex where the bipolar R-wave was 9.6 with pace impedance of 736, a threshold 0.6, 0.5 current threshold 0.9 mA.  There is no diaphragmatic pacing at 10 volts.  The current threshold was modest to best.  The  lead was secured to the prepectoral fascia.  We then gained access.  We then used the singly placed wire and a placed a 9.5 French sheath through this point a Medtronic MB2 coronary sinus cannulation catheter which allowed for cannulation of the coronary sinus without difficulty.  A venogram was obtained.  Three branches were identified.  There was one low lateral branch, a very posterior branch and a high lateral branch and then a high lateral branch turned out to be just distal to a vein.  On multiple occasions using a variety of techniques described below, we were able to get a wire about 2 inches into the vein, but we could not pass it past the location.  I could not pass it past the  location and it prolapsed on multiple efforts.  These strategies included using an attained select 90 degree, 135 degree, straight wiring.  We then sought to cannulate the low lateral branch, which turned out to be quite small and did not take the wire for more than a couple of inches.  I mapped the superior aspect of the heart looking for a high lateral branch and did not find any that seemed to be reasonable.  There was one that seemed to be quite short.  We then used double Wholey wires to cannulate the posterior bailout branch but unfortunately, there were no ramifications of this  branch apart from those that were quite apical.  There was one vein that was more to the midportion of the heart cannulation of this vein with a 4196 Medtronic LV lead serial number [id number] unfortunately had diaphragmatic pacing and all pacing configurations were available.  I then elected after these very protracted efforts to go back up to the anterolateral area and see whether that small vein were hold 4196.  We then did that and it turned out that this branch was large enough to allow for the placement of 4196 at the junction between the mid and proximal thirds.  In this location, the bipolar L wave was greater than  30 with a pace impedance of 1613 at threshold 0.8 and 0.5.  Current threshold was 0.5 MA.  There is no diaphragmatic pacing at 10 volts.  At this point, the 9.5 French sheath was removed and a 7-French sheath was placed over the last retained wire and a Medtronic [id number]-cm leads serial number [id number] was deployed.  The right atrial appendage bipolar P-wave was 1.9 with a pace impedance of 703 with threshold of 0.5, the current threshold 1.4 MA and there is no diaphragmatic pacing at 10 volts.  The current of injury was brisk and this lead was secured to the prepectoral fascia.  At this point, the deployment system for the LV lead was removed.  The parameters were confirmed.  The lead was secured to the prepectoral fascia was slack added to the right atrial portion.  The pocket was then copiously irrigated with antibiotic containing saline solution.  The patient was on PLAVIX and ASPIRIN.  So great deal of time was spent on hemostasis.  A number of hemostatic sutures were placed around the entry site for the leads into the vein.  Surgicel was used on the posterior and cephalad aspect.  The pocket was dry at closure.  The leads and pulse generator placed in the pocket secured to the prepectoral fascia.  Defibrillation threshold testing was undertaken. Ventricular fibrillation was induced via T-wave shock after a total duration of 7 seconds a 15 joules shock was delivered through measured resistance of 43 ohms terminating ventricular fibrillation and restoring sinus rhythm.  Following copious irrigation with antibiotic containing saline solution.  The device was secured to the prepectoral fascia and the wound was closed in 3 layers in normal fashion.  The wound was washed, dried and Benzoin Steri-Strip was applied.  A benzoin pressure dressing was thereafter applied also because of the aspirin, Plavix combination.  The patient tolerated the procedure without apparent  complication.     Duke Salvia, MD, [facility]     SCK/MEDQ  D:  05/06/2011  T:  05/07/2011  Job:  [id number]  Electronically Signed by Sherryl Manges MD [facility] on 05/19/2011 04:34:43 PM

## 2011-05-25 ENCOUNTER — Ambulatory Visit (INDEPENDENT_AMBULATORY_CARE_PROVIDER_SITE_OTHER): Payer: Medicare HMO | Admitting: *Deleted

## 2011-05-25 ENCOUNTER — Other Ambulatory Visit (INDEPENDENT_AMBULATORY_CARE_PROVIDER_SITE_OTHER): Payer: Medicare HMO | Admitting: *Deleted

## 2011-05-25 DIAGNOSIS — I5022 Chronic systolic (congestive) heart failure: Secondary | ICD-10-CM

## 2011-05-25 DIAGNOSIS — E875 Hyperkalemia: Secondary | ICD-10-CM

## 2011-05-25 DIAGNOSIS — I429 Cardiomyopathy, unspecified: Secondary | ICD-10-CM

## 2011-05-25 DIAGNOSIS — I428 Other cardiomyopathies: Secondary | ICD-10-CM

## 2011-05-25 LAB — ICD DEVICE OBSERVATION
ATRIAL PACING ICD: 1.08 pct
BAMS-0001: 170 {beats}/min
FVT: 0
LV LEAD IMPEDENCE ICD: 608 Ohm
LV LEAD THRESHOLD: 0.625 V
RV LEAD THRESHOLD: 0.625 V
TZAT-0001ATACH: 1
TZAT-0001ATACH: 2
TZAT-0001ATACH: 3
TZAT-0002ATACH: NEGATIVE
TZAT-0002SLOWVT: NEGATIVE
TZAT-0012ATACH: 150 ms
TZAT-0012ATACH: 150 ms
TZAT-0012SLOWVT: 170 ms
TZAT-0018ATACH: NEGATIVE
TZAT-0018ATACH: NEGATIVE
TZAT-0018ATACH: NEGATIVE
TZAT-0018SLOWVT: NEGATIVE
TZAT-0019ATACH: 6 V
TZAT-0019FASTVT: 8 V
TZAT-0019SLOWVT: 8 V
TZAT-0020ATACH: 1.5 ms
TZAT-0020FASTVT: 1.5 ms
TZON-0004SLOWVT: 16
TZON-0004VSLOWVT: 32
TZON-0005SLOWVT: 12
TZST-0001ATACH: 4
TZST-0001ATACH: 5
TZST-0001ATACH: 6
TZST-0001FASTVT: 3
TZST-0001SLOWVT: 2
TZST-0001SLOWVT: 6
TZST-0002ATACH: NEGATIVE
TZST-0002ATACH: NEGATIVE
TZST-0002ATACH: NEGATIVE
TZST-0002FASTVT: NEGATIVE
TZST-0002FASTVT: NEGATIVE
TZST-0002SLOWVT: NEGATIVE
TZST-0002SLOWVT: NEGATIVE
TZST-0002SLOWVT: NEGATIVE
TZST-0002SLOWVT: NEGATIVE
VENTRICULAR PACING ICD: 99.78 pct
VF: 0

## 2011-05-25 LAB — BASIC METABOLIC PANEL
CO2: 20 mEq/L (ref 19–32)
Calcium: 8.8 mg/dL (ref 8.4–10.5)
Glucose, Bld: 173 mg/dL — ABNORMAL HIGH (ref 70–99)
Sodium: 135 mEq/L (ref 135–145)

## 2011-05-25 NOTE — Progress Notes (Signed)
icd check in clinic  

## 2011-06-01 ENCOUNTER — Other Ambulatory Visit: Payer: Self-pay | Admitting: Cardiology

## 2011-06-01 NOTE — Telephone Encounter (Signed)
[facility] pt. 

## 2011-06-15 ENCOUNTER — Encounter: Payer: Self-pay | Admitting: Cardiology

## 2011-06-15 ENCOUNTER — Ambulatory Visit (INDEPENDENT_AMBULATORY_CARE_PROVIDER_SITE_OTHER): Payer: Medicare HMO | Admitting: Cardiology

## 2011-06-15 VITALS — BP 158/64 | HR 56 | Ht 68.0 in | Wt 230.0 lb

## 2011-06-15 DIAGNOSIS — I5022 Chronic systolic (congestive) heart failure: Secondary | ICD-10-CM

## 2011-06-15 DIAGNOSIS — I2510 Atherosclerotic heart disease of native coronary artery without angina pectoris: Secondary | ICD-10-CM

## 2011-06-15 DIAGNOSIS — E119 Type 2 diabetes mellitus without complications: Secondary | ICD-10-CM

## 2011-06-15 DIAGNOSIS — I10 Essential (primary) hypertension: Secondary | ICD-10-CM

## 2011-06-15 DIAGNOSIS — N289 Disorder of kidney and ureter, unspecified: Secondary | ICD-10-CM

## 2011-06-15 DIAGNOSIS — I428 Other cardiomyopathies: Secondary | ICD-10-CM

## 2011-06-15 NOTE — Progress Notes (Signed)
Clinical Summary [name] is a 70 y.o.male presenting for followup. He presents for post hospital followup, recent admission in late June for placement of a biventricular defibrillator by Dr. Johney Frame, following consultation with Dr. Graciela Husbands.  He states he feels better, less short of breath, NYHA class II symptoms. No active angina or palpitations. States his weight has gone up, although lower extremity edema has been less of a problem. He has not been using his Lasix regularly.  Reports follow up now with an endocrinologist, on insulin, and possibly some of his weight gain is related to this.  He continues to dance regularly, feels better, is able to stay out on the floor longer.  We reviewed his medications. He was not certain about his medicine doses, states that they were cut back temporarily by Dr. Sherryll Burger.   Allergies  Allergen Reactions  . Codeine     REACTION: blisters    Medication list reviewed.  Past Medical History  Diagnosis Date  . Mild renal insufficiency   . Type 2 diabetes mellitus   . Coronary atherosclerosis of native coronary artery     DES RCA 5/10, PTCA RCA (ISR), 7/11  . Hyperlipidemia   . TIA (transient ischemic attack)   . Cardiomyopathy, ischemic     LVEF 25-35%  . Morbid obesity   . Mitral regurgitation     Mild  . BPH (benign prostatic hypertrophy)   . LBBB (left bundle branch block)   . Paroxysmal atrial fibrillation     Past Surgical History  Procedure Date  . Cholecystectomy   . Total knee arthroplasty   . Vasectomy   . Lung surgery   . Icd placement     Medtronic Protecta XT CRT-D    Family History  Problem Relation Age of Onset  . Heart disease Other     family h/o premature cardiovascular disease    Social History [name] reports that he quit smoking about 46 years ago. His smoking use included Cigarettes. He has a 6 pack-year smoking history. He has never used smokeless tobacco. [name] reports that he does not drink  alcohol.  Review of Systems No palpitations or syncope. No device discharges. Otherwise negative.  Physical Examination Filed Vitals:   06/15/11 0925  BP: 158/64  Pulse: 56  Obese male in no acute distress. HEENT: Conjunctiva and lids normal, oropharynx with moist mucosa. Neck: Supple, no elevated JVP with increased girth. Cardiac: Regular rate and rhythm, no S3. Thorax: Well-healed left upper chest device pocket site. Abdomen: Obese, nontender, bowel sounds present. Skin: Warm and dry. Extremities: Trace ankle edema. Musculoskeletal: No kyphosis. Neuropsychiatric: Alert and oriented x3, affect appropriate.   ECG Chest x-ray 05/07/2011: Findings: Pacemaker leads in expected position.  No pneumothorax.   There is cardiomegaly, mild and unchanged.  The pulmonary   vasculature is within normal limits. Lungs are clear.    IMPRESSION:   New implant device with no evidence of pneumothorax or pulmonary   edema.    Problem List and Plan

## 2011-06-15 NOTE — Assessment & Plan Note (Signed)
Blood pressure and optimal today. We discussed sodium restriction. Add back low-dose diuretic every other day. Consider addition of hydralazine if needed.

## 2011-06-15 NOTE — Assessment & Plan Note (Signed)
Symptomatically stable with NYHA class II symptoms. Plan to continue Coreg, Plendil, and would suggest using low-dose Lasix every other day, recommended that he keep an eye on his weight. Not on ACE inhibitor or ARB related to prior problems with hyperkalemia in the setting of renal insufficiency. Could consider hydralazine if needed, particularly if blood pressure trend remains up.

## 2011-06-15 NOTE — Assessment & Plan Note (Signed)
Now status post Medtronic CRT-D. Will maintain EP followup with Dr. Johney Frame in [place].

## 2011-06-15 NOTE — Patient Instructions (Signed)
Follow up as scheduled with Drs. Allred and McDowell. Take Lasix (furosemide) 20 mg every other day.

## 2011-06-15 NOTE — Assessment & Plan Note (Signed)
No active angina on medical therapy, continue observation. Has p.r.n. nitroglycerin.

## 2011-06-15 NOTE — Assessment & Plan Note (Signed)
Continue followup with Dr. Sherryll Burger and endocrinology.

## 2011-09-04 ENCOUNTER — Encounter: Payer: Self-pay | Admitting: Cardiology

## 2011-09-09 ENCOUNTER — Encounter: Payer: Medicare HMO | Admitting: Internal Medicine

## 2011-09-11 ENCOUNTER — Encounter: Payer: Self-pay | Admitting: Internal Medicine

## 2011-09-11 ENCOUNTER — Ambulatory Visit (INDEPENDENT_AMBULATORY_CARE_PROVIDER_SITE_OTHER): Payer: Medicare HMO | Admitting: Internal Medicine

## 2011-09-11 DIAGNOSIS — I5022 Chronic systolic (congestive) heart failure: Secondary | ICD-10-CM

## 2011-09-11 DIAGNOSIS — I428 Other cardiomyopathies: Secondary | ICD-10-CM

## 2011-09-11 DIAGNOSIS — I495 Sick sinus syndrome: Secondary | ICD-10-CM

## 2011-09-11 LAB — ICD DEVICE OBSERVATION
AL IMPEDENCE ICD: 456 Ohm
AL THRESHOLD: 0.5 V
BATTERY VOLTAGE: 3.196 V
LV LEAD IMPEDENCE ICD: 665 Ohm
RV LEAD AMPLITUDE: 11 mv
TOT-0002: 0
TOT-0006: 20120620000000
TZAT-0001ATACH: 2
TZAT-0001FASTVT: 1
TZAT-0001SLOWVT: 1
TZAT-0002ATACH: NEGATIVE
TZAT-0002FASTVT: NEGATIVE
TZAT-0012ATACH: 150 ms
TZAT-0012ATACH: 150 ms
TZAT-0012FASTVT: 170 ms
TZAT-0012SLOWVT: 170 ms
TZAT-0018ATACH: NEGATIVE
TZAT-0018FASTVT: NEGATIVE
TZAT-0019ATACH: 6 V
TZAT-0020ATACH: 1.5 ms
TZAT-0020ATACH: 1.5 ms
TZON-0003ATACH: 350 ms
TZON-0003VSLOWVT: 400 ms
TZON-0004SLOWVT: 16
TZON-0004VSLOWVT: 32
TZST-0001ATACH: 4
TZST-0001FASTVT: 4
TZST-0001FASTVT: 6
TZST-0001SLOWVT: 2
TZST-0001SLOWVT: 3
TZST-0001SLOWVT: 4
TZST-0002FASTVT: NEGATIVE
TZST-0002FASTVT: NEGATIVE
TZST-0002FASTVT: NEGATIVE
TZST-0002SLOWVT: NEGATIVE
TZST-0002SLOWVT: NEGATIVE

## 2011-09-11 NOTE — Progress Notes (Signed)
The patient presents today for routine electrophysiology followup.  Since having his BiV ICD implanted by Dr Graciela Husbands, the patient reports doing very well.  He continues to have occasional fatigue and daytime somnolence.  Despite this, however he continues to go dancing 3-4 nights per week.   Today, he denies symptoms of palpitations, chest pain, shortness of breath, orthopnea, PND, lower extremity edema, dizziness, presyncope, syncope, or neurologic sequela.  The patient feels that he is tolerating medications without difficulties and is otherwise without complaint today.   Past Medical History  Diagnosis Date  . Mild renal insufficiency   . Type 2 diabetes mellitus   . Coronary atherosclerosis of native coronary artery     DES RCA 5/10, PTCA RCA (ISR), 7/11  . Hyperlipidemia   . TIA (transient ischemic attack)   . Cardiomyopathy, ischemic     LVEF 25-35%  . Morbid obesity   . Mitral regurgitation     Mild  . BPH (benign prostatic hypertrophy)   . LBBB (left bundle branch block)     s/p BiV ICD Implanted by Dr Graciela Husbands  . Paroxysmal atrial fibrillation   . Chronic systolic dysfunction of left ventricle    Past Surgical History  Procedure Date  . Cholecystectomy   . Total knee arthroplasty   . Vasectomy   . Lung surgery   . Icd placement     Medtronic Protecta XT CRT-D    Current Outpatient Prescriptions  Medication Sig Dispense Refill  . acetaminophen (TYLENOL) 650 MG CR tablet Take 2 by mouth twice daily        . aspirin 325 MG tablet Take 325 mg by mouth daily.        . carvedilol (COREG) 6.25 MG tablet TAKE 1 TABLET BY MOUTH TWICE DAILY EMERGENCY REFILL FAXED DR  60 tablet  6  . clopidogrel (PLAVIX) 75 MG tablet 1 tab po qd  30 tablet  6  . felodipine (PLENDIL) 10 MG 24 hr tablet TAKE ONE TABLET BY MOUTH DAILY EMERGENCY REFILL FAXED DR  30 tablet  6  . furosemide (LASIX) 20 MG tablet Take 20 mg by mouth every other day.        . insulin glargine (LANTUS) 100 UNIT/ML injection  Inject into the skin at bedtime.        . insulin regular (HUMULIN R,NOVOLIN R) 100 UNIT/ML injection Inject into the skin as directed.        Marland Kitchen L-Methylfolate-B6-B12 (NEURPATH-B) 3-35-2 MG TABS Take one by mouth twice daily        . metFORMIN (GLUCOPHAGE) 1000 MG tablet Take 500 mg by mouth 2 (two) times daily with a meal.       . nitroGLYCERIN (NITROSTAT) 0.4 MG SL tablet Place 0.4 mg under the tongue every 5 (five) minutes as needed.        . pyridoxine (B-6) 50 MG tablet Take 50 mg by mouth daily.        . Zinc 50 MG CAPS Take by mouth daily.          Allergies  Allergen Reactions  . Codeine     REACTION: blisters    History   Social History  . Marital Status: Married    Spouse Name: N/A    Number of Children: N/A  . Years of Education: N/A   Occupational History  . Not on file.   Social History Main Topics  . Smoking status: Former Smoker -- 1.0 packs/day for 6 years  Types: Cigarettes    Quit date: 11/16/1964  . Smokeless tobacco: Never Used  . Alcohol Use: No  . Drug Use: No  . Sexually Active: Not on file   Other Topics Concern  . Not on file   Social History Narrative  . No narrative on file    Family History  Problem Relation Age of Onset  . Heart disease Other     family h/o premature cardiovascular disease    Physical Exam: Filed Vitals:   09/11/11 1441  BP: 146/79  Pulse: 72  Height: 5\' 8"  (1.727 m)  Weight: 229 lb (103.874 kg)    GEN- The patient is well appearing, alert and oriented x 3 today.   Head- normocephalic, atraumatic Eyes-  Sclera clear, conjunctiva pink Ears- hearing intact Oropharynx- clear Neck- supple, no JVP Lymph- no cervical lymphadenopathy Lungs- Clear to ausculation bilaterally, normal work of breathing Chest- ICD pocket is well healed Heart- Regular rate and rhythm, no murmurs, rubs or gallops, PMI not laterally displaced GI- soft, NT, ND, + BS Extremities- no clubbing, cyanosis, or edema  ICD interrogation-  reviewed in detail today,  See PACEART report  Assessment and Plan:

## 2011-09-11 NOTE — Assessment & Plan Note (Signed)
Normal BiV ICD function See Pace Art report No changes today  Return 6/12

## 2011-09-14 ENCOUNTER — Ambulatory Visit (INDEPENDENT_AMBULATORY_CARE_PROVIDER_SITE_OTHER): Payer: Medicare HMO | Admitting: Cardiology

## 2011-09-14 ENCOUNTER — Encounter: Payer: Self-pay | Admitting: Cardiology

## 2011-09-14 VITALS — BP 152/65 | HR 65 | Ht 68.0 in | Wt 231.0 lb

## 2011-09-14 DIAGNOSIS — E119 Type 2 diabetes mellitus without complications: Secondary | ICD-10-CM

## 2011-09-14 DIAGNOSIS — E785 Hyperlipidemia, unspecified: Secondary | ICD-10-CM

## 2011-09-14 DIAGNOSIS — I10 Essential (primary) hypertension: Secondary | ICD-10-CM

## 2011-09-14 DIAGNOSIS — I2510 Atherosclerotic heart disease of native coronary artery without angina pectoris: Secondary | ICD-10-CM

## 2011-09-14 DIAGNOSIS — I5022 Chronic systolic (congestive) heart failure: Secondary | ICD-10-CM

## 2011-09-14 MED ORDER — FUROSEMIDE 20 MG PO TABS: 20.0000 mg | ORAL_TABLET | Freq: Every day | ORAL | Status: DC

## 2011-09-14 MED ORDER — HYDRALAZINE HCL 25 MG PO TABS: 25.0000 mg | ORAL_TABLET | Freq: Two times a day (BID) | ORAL | Status: DC

## 2011-09-14 NOTE — Assessment & Plan Note (Signed)
Continue followup with endocrinology. Would aim for hemoglobin A1c less than 7.

## 2011-09-14 NOTE — Assessment & Plan Note (Signed)
LDL 101, although has not been able to tolerate statin therapy.

## 2011-09-14 NOTE — Assessment & Plan Note (Signed)
Not optimally controlled. We reviewed his medications. Plan to add hydralazine 25 mg starting at twice daily dosing. We can advance from there. May also be helpful with cardiomyopathy. He was unable to take ACE inhibitors or angiotensin receptor blockers related to renal insufficiency in the past.

## 2011-09-14 NOTE — Progress Notes (Signed)
Clinical Summary [name] is a 70 y.o.male presenting for followup. I saw him back in July, and he has had subsequent followup with Dr. Johney Frame.  He reports no angina, NYHA class 2-3 dyspnea on exertion. Still able to go dancing. Sometimes he states that stress and emotional upset gives him chest pain. He reports compliance with his medications.   Blood pressure is still not optimally controlled. We discussed this today.  Recent lab work with his endocrinologist showed HgbA1C 7.3 and LDL 101. He has not been able tolerate statin therapy.   Allergies  Allergen Reactions  . Codeine     REACTION: blisters    Medication list reviewed.  Past Medical History  Diagnosis Date  . Mild renal insufficiency   . Type 2 diabetes mellitus   . Coronary atherosclerosis of native coronary artery     DES RCA 5/10, PTCA RCA (ISR), 7/11  . Hyperlipidemia   . TIA (transient ischemic attack)   . Cardiomyopathy, ischemic     LVEF 25-35%  . Morbid obesity   . Mitral regurgitation     Mild  . BPH (benign prostatic hypertrophy)   . LBBB (left bundle branch block)     s/p BiV ICD Implanted by Dr Graciela Husbands  . Paroxysmal atrial fibrillation   . Chronic systolic dysfunction of left ventricle     Past Surgical History  Procedure Date  . Cholecystectomy   . Total knee arthroplasty   . Vasectomy   . Lung surgery   . Icd placement     Medtronic Protecta XT CRT-D    Family History  Problem Relation Age of Onset  . Heart disease Other     family h/o premature cardiovascular disease    Social History [name] reports that he quit smoking about 46 years ago. His smoking use included Cigarettes. He has a 6 pack-year smoking history. He has never used smokeless tobacco. [name] reports that he does not drink alcohol.  Review of Systems No palpitations or device discharges. States he has gained some weight. Using Lasix daily. No orthopnea or PND. Otherwise negative except as outlined.  Physical  Examination Filed Vitals:   09/14/11 0858  BP: 152/65  Pulse: 65    Obese male in no acute distress.  HEENT: Conjunctiva and lids normal, oropharynx with moist mucosa.  Neck: Supple, no elevated JVP with increased girth.  Cardiac: Regular rate and rhythm, no S3.  Thorax: Well-healed left upper chest device pocket site.  Abdomen: Obese, nontender, bowel sounds present.  Skin: Warm and dry.  Extremities: Trace ankle edema.  Musculoskeletal: No kyphosis.  Neuropsychiatric: Alert and oriented x3, affect appropriate.    Problem List and Plan

## 2011-09-14 NOTE — Assessment & Plan Note (Signed)
Continue medical therapy, afterload reduction with hydralazine added, status post biventricular ICD.

## 2011-09-14 NOTE — Assessment & Plan Note (Signed)
Continue medical therapy and observation. Recommend weight loss and exercise.

## 2011-09-14 NOTE — Patient Instructions (Signed)
Your physician wants you to follow-up in: 3 months. You will receive a reminder letter in the mail one-two months in advance. If you don't receive a letter, please call our office to schedule the follow-up appointment. Take Lasix (furosemide) 20 mg daily. Hydralazine 25 mg two times a day.

## 2011-09-24 ENCOUNTER — Other Ambulatory Visit: Payer: Self-pay | Admitting: Cardiovascular Disease

## 2011-11-25 ENCOUNTER — Other Ambulatory Visit: Payer: Self-pay | Admitting: Cardiology

## 2011-11-26 NOTE — Telephone Encounter (Signed)
**Note De-identified  Obfuscation** [place] pt. 

## 2011-12-10 ENCOUNTER — Encounter: Payer: Medicare HMO | Admitting: *Deleted

## 2011-12-14 ENCOUNTER — Encounter: Payer: Self-pay | Admitting: *Deleted

## 2012-04-11 ENCOUNTER — Encounter: Payer: Self-pay | Admitting: *Deleted

## 2012-04-11 ENCOUNTER — Other Ambulatory Visit: Payer: Self-pay | Admitting: Cardiology

## 2012-05-24 ENCOUNTER — Other Ambulatory Visit: Payer: Self-pay | Admitting: Cardiovascular Disease

## 2012-05-25 ENCOUNTER — Other Ambulatory Visit: Payer: Self-pay | Admitting: Cardiology

## 2012-05-25 MED ORDER — CARVEDILOL 6.25 MG PO TABS: 6.2500 mg | ORAL_TABLET | Freq: Two times a day (BID) | ORAL | Status: DC

## 2012-05-30 ENCOUNTER — Other Ambulatory Visit: Payer: Self-pay | Admitting: Cardiology

## 2012-05-30 MED ORDER — CLOPIDOGREL BISULFATE 75 MG PO TABS: 75.0000 mg | ORAL_TABLET | Freq: Every day | ORAL | Status: DC

## 2012-07-03 ENCOUNTER — Other Ambulatory Visit: Payer: Self-pay | Admitting: Cardiology

## 2012-07-27 ENCOUNTER — Other Ambulatory Visit: Payer: Self-pay | Admitting: Cardiology

## 2012-08-02 ENCOUNTER — Other Ambulatory Visit: Payer: Self-pay | Admitting: Cardiology

## 2012-08-02 MED ORDER — CLOPIDOGREL BISULFATE 75 MG PO TABS: 75.0000 mg | ORAL_TABLET | Freq: Every day | ORAL | Status: DC

## 2012-08-14 ENCOUNTER — Other Ambulatory Visit: Payer: Self-pay | Admitting: Cardiology

## 2012-08-17 ENCOUNTER — Encounter: Payer: Self-pay | Admitting: *Deleted

## 2012-08-17 DIAGNOSIS — Z95810 Presence of automatic (implantable) cardiac defibrillator: Secondary | ICD-10-CM | POA: Insufficient documentation

## 2012-08-24 ENCOUNTER — Encounter: Payer: Self-pay | Admitting: Internal Medicine

## 2012-08-24 ENCOUNTER — Ambulatory Visit (INDEPENDENT_AMBULATORY_CARE_PROVIDER_SITE_OTHER): Payer: Medicare Other | Admitting: Internal Medicine

## 2012-08-24 VITALS — BP 150/80 | HR 65 | Ht 68.0 in | Wt 232.0 lb

## 2012-08-24 DIAGNOSIS — I10 Essential (primary) hypertension: Secondary | ICD-10-CM

## 2012-08-24 DIAGNOSIS — I5022 Chronic systolic (congestive) heart failure: Secondary | ICD-10-CM

## 2012-08-24 DIAGNOSIS — Z95810 Presence of automatic (implantable) cardiac defibrillator: Secondary | ICD-10-CM

## 2012-08-24 DIAGNOSIS — I428 Other cardiomyopathies: Secondary | ICD-10-CM

## 2012-08-24 LAB — ICD DEVICE OBSERVATION
AL THRESHOLD: 0.625 V
ATRIAL PACING ICD: 0.4 pct
BAMS-0001: 170 {beats}/min
LV LEAD IMPEDENCE ICD: 703 Ohm
RV LEAD AMPLITUDE: 9.1 mv
RV LEAD IMPEDENCE ICD: 456 Ohm
RV LEAD THRESHOLD: 0.625 V
TZAT-0001ATACH: 1
TZAT-0001FASTVT: 1
TZAT-0001SLOWVT: 1
TZAT-0002FASTVT: NEGATIVE
TZAT-0002SLOWVT: NEGATIVE
TZAT-0012ATACH: 150 ms
TZAT-0012ATACH: 150 ms
TZAT-0012FASTVT: 170 ms
TZAT-0012SLOWVT: 170 ms
TZAT-0018ATACH: NEGATIVE
TZAT-0018ATACH: NEGATIVE
TZAT-0018FASTVT: NEGATIVE
TZAT-0018SLOWVT: NEGATIVE
TZAT-0019FASTVT: 8 V
TZAT-0020ATACH: 1.5 ms
TZAT-0020ATACH: 1.5 ms
TZAT-0020ATACH: 1.5 ms
TZON-0003ATACH: 350 ms
TZON-0003VSLOWVT: 400 ms
TZST-0001ATACH: 4
TZST-0001FASTVT: 4
TZST-0001FASTVT: 5
TZST-0001FASTVT: 6
TZST-0001SLOWVT: 2
TZST-0001SLOWVT: 4
TZST-0001SLOWVT: 5
TZST-0002ATACH: NEGATIVE
TZST-0002FASTVT: NEGATIVE
TZST-0002FASTVT: NEGATIVE
TZST-0002FASTVT: NEGATIVE
TZST-0002SLOWVT: NEGATIVE
TZST-0002SLOWVT: NEGATIVE
TZST-0002SLOWVT: NEGATIVE
VENTRICULAR PACING ICD: 99.9 pct

## 2012-08-24 NOTE — Progress Notes (Signed)
PCP: [name], MD Primary Cardiologist:  [name] is a 71 y.o. male who presents today for routine electrophysiology followup.  Since last being seen in our clinic, the patient reports doing very well. He remains active and continues to dance for recreation.  Today, he denies symptoms of palpitations, chest pain, shortness of breath,  lower extremity edema, dizziness, presyncope, syncope, or ICD shocks.  The patient is otherwise without complaint today.   Past Medical History  Diagnosis Date  . Mild renal insufficiency   . Type 2 diabetes mellitus   . Coronary atherosclerosis of native coronary artery     DES RCA 5/10, PTCA RCA (ISR), 7/11  . Hyperlipidemia   . TIA (transient ischemic attack)   . Cardiomyopathy, ischemic     LVEF 25-35%  . Morbid obesity   . Mitral regurgitation     Mild  . BPH (benign prostatic hypertrophy)   . LBBB (left bundle branch block)     s/p BiV ICD Implanted by Dr Graciela Husbands  . Paroxysmal atrial fibrillation   . Chronic systolic dysfunction of left ventricle    Past Surgical History  Procedure Date  . Cholecystectomy   . Total knee arthroplasty   . Vasectomy   . Lung surgery   . Icd placement     Medtronic Protecta XT CRT-D    Current Outpatient Prescriptions  Medication Sig Dispense Refill  . acetaminophen (TYLENOL) 650 MG CR tablet Take 2 by mouth twice daily        . aspirin 325 MG tablet Take 325 mg by mouth daily.        . carvedilol (COREG) 6.25 MG tablet Take 1 tablet (6.25 mg total) by mouth 2 (two) times daily with a meal.  60 tablet  6  . clopidogrel (PLAVIX) 75 MG tablet Take 1 tablet (75 mg total) by mouth daily.  15 tablet  0  . felodipine (PLENDIL) 10 MG 24 hr tablet TAKE ONE TABLET BY MOUTH DAILY - EMERGENCY REFILL FAXED DR  30 tablet  4  . furosemide (LASIX) 20 MG tablet TAKE ONE TABLET BY MOUTH EVERY DAY AND AS DIRECTED  60 tablet  5  . HUMALOG MIX 75/25 KWIKPEN (75-25) 100 UNIT/ML SUSP Inject into the skin as  directed.      . hydrALAZINE (APRESOLINE) 25 MG tablet TAKE ONE TABLET BY MOUTH TWICE DAILY  60 tablet  2  . iron polysaccharides (NIFEREX) 150 MG capsule Take 150 mg by mouth daily.      Marland Kitchen L-Methylfolate-B6-B12 (NEURPATH-B) 3-35-2 MG TABS Take one by mouth twice daily        . NITROSTAT 0.4 MG SL tablet 1 TABLET UNDER TONGUE EVERY 5 MINUTES UP TO 3 DOSES  25 each  3  . pyridoxine (B-6) 50 MG tablet Take 50 mg by mouth daily.        . Zinc 50 MG CAPS Take by mouth daily.          Physical Exam: Filed Vitals:   08/24/12 0943  BP: 150/80  Pulse: 65  Height: 5\' 8"  (1.727 m)  Weight: 232 lb (105.235 kg)    GEN- The patient is well appearing, alert and oriented x 3 today.   Head- normocephalic, atraumatic Eyes-  Sclera clear, conjunctiva pink Ears- hearing intact Oropharynx- clear Lungs- Clear to ausculation bilaterally, normal work of breathing Chest- ICD pocket is well healed Heart- Regular rate and rhythm, no murmurs, rubs or gallops, PMI not laterally displaced GI-  soft, NT, ND, + BS Extremities- no clubbing, cyanosis, or edema  ICD interrogation- reviewed in detail today,  See PACEART report  Assessment and Plan:  1.  Chronic systolic dysfunction euvolemic today Stable on an appropriate medical regimen Normal BiVICD function See Pace Art report No changes today  2. HTN Above goal today He reports good BP control at home No changes

## 2012-08-24 NOTE — Patient Instructions (Signed)
   [facility] every 3 months Your physician wants you to follow up in:  1 year.  You will receive a reminder letter in the mail one-two months in advance.  If you don't receive a letter, please call our office to schedule the follow up appointment

## 2012-09-14 ENCOUNTER — Other Ambulatory Visit: Payer: Self-pay | Admitting: Cardiology

## 2012-09-26 ENCOUNTER — Other Ambulatory Visit: Payer: Self-pay | Admitting: *Deleted

## 2012-09-26 MED ORDER — CLOPIDOGREL BISULFATE 75 MG PO TABS: 75.0000 mg | ORAL_TABLET | Freq: Every day | ORAL | Status: DC

## 2012-10-03 ENCOUNTER — Encounter: Payer: Self-pay | Admitting: Cardiology

## 2012-10-04 ENCOUNTER — Encounter: Payer: Self-pay | Admitting: Cardiology

## 2012-10-04 ENCOUNTER — Ambulatory Visit (INDEPENDENT_AMBULATORY_CARE_PROVIDER_SITE_OTHER): Payer: Medicare Other | Admitting: Cardiology

## 2012-10-04 VITALS — BP 173/81 | HR 70 | Ht 68.0 in | Wt 236.8 lb

## 2012-10-04 DIAGNOSIS — I5022 Chronic systolic (congestive) heart failure: Secondary | ICD-10-CM

## 2012-10-04 DIAGNOSIS — I10 Essential (primary) hypertension: Secondary | ICD-10-CM

## 2012-10-04 DIAGNOSIS — E785 Hyperlipidemia, unspecified: Secondary | ICD-10-CM

## 2012-10-04 DIAGNOSIS — Z95810 Presence of automatic (implantable) cardiac defibrillator: Secondary | ICD-10-CM

## 2012-10-04 DIAGNOSIS — I2510 Atherosclerotic heart disease of native coronary artery without angina pectoris: Secondary | ICD-10-CM

## 2012-10-04 MED ORDER — HYDRALAZINE HCL 25 MG PO TABS: 25.0000 mg | ORAL_TABLET | Freq: Two times a day (BID) | ORAL | Status: DC

## 2012-10-04 NOTE — Progress Notes (Signed)
Clinical Summary [name] is a 71 y.o.male presenting for followup. He was seen in October of last year. Also had a recent followup visit with Dr. Johney Frame, reporting no device shocks with stable BIVICD function.  He reports only occasional angina and infrequent nitroglycerin use. He continues to dance 3 nights a week. Denies any device shocks, palpitations or syncope.  Lab work from May showed BUN 22, creatinine 1.2, potassium 4.6. He follows with Dr. Kristian Covey. He also sees an endocrinologist in [place] and has had several medication changes made for tighter control diabetes.  His weight is increased and we discussed his diet somewhat today. Blood pressure is elevated, although he tells me that it is typically better at home, systolics 120s to 140s. I did ask him to keep a close eye on this as further medication titration could be done.   Allergies  Allergen Reactions  . Codeine     REACTION: blisters    Current Outpatient Prescriptions  Medication Sig Dispense Refill  . acetaminophen (TYLENOL) 650 MG CR tablet Take 2 by mouth twice daily        . aspirin 325 MG tablet Take 325 mg by mouth daily.        . carvedilol (COREG) 6.25 MG tablet Take 1 tablet (6.25 mg total) by mouth 2 (two) times daily with a meal.  60 tablet  6  . clopidogrel (PLAVIX) 75 MG tablet Take 1 tablet (75 mg total) by mouth daily.  30 tablet  6  . felodipine (PLENDIL) 10 MG 24 hr tablet TAKE ONE TABLET BY MOUTH DAILY - EMERGENCY REFILL FAXED DR  30 tablet  4  . furosemide (LASIX) 20 MG tablet TAKE ONE TABLET BY MOUTH EVERY DAY AND AS DIRECTED  60 tablet  5  . hydrALAZINE (APRESOLINE) 25 MG tablet Take 1 tablet (25 mg total) by mouth 2 (two) times daily.  60 tablet  6  . insulin lispro protamine-insulin lispro (HUMALOG 75/25) (75-25) 100 UNIT/ML SUSP Inject 20 Units into the skin. After every meal      . iron polysaccharides (NIFEREX) 150 MG capsule Take 150 mg by mouth daily.      Marland Kitchen L-Methylfolate-B6-B12  (NEURPATH-B) 3-35-2 MG TABS Take one by mouth twice daily        . Linagliptin (TRADJENTA PO) Take 1 tablet by mouth daily.      Marland Kitchen NITROSTAT 0.4 MG SL tablet 1 TABLET UNDER TONGUE EVERY 5 MINUTES UP TO 3 DOSES  25 each  3  . pyridoxine (B-6) 50 MG tablet Take 50 mg by mouth daily.        . Zinc 50 MG CAPS Take by mouth daily.        . [DISCONTINUED] hydrALAZINE (APRESOLINE) 25 MG tablet TAKE ONE TABLET BY MOUTH TWICE DAILY  60 tablet  2    Past Medical History  Diagnosis Date  . Type 2 diabetes mellitus   . Coronary atherosclerosis of native coronary artery     DES RCA 5/10, PTCA RCA (ISR), 7/11  . Hyperlipidemia   . TIA (transient ischemic attack)   . Cardiomyopathy, ischemic     LVEF 25-35%  . Morbid obesity   . Mitral regurgitation     Mild  . BPH (benign prostatic hypertrophy)   . LBBB (left bundle branch block)     s/p BiV ICD Implanted by Dr Graciela Husbands  . Paroxysmal atrial fibrillation   . Chronic systolic dysfunction of left ventricle   . Chronic kidney disease,  stage III (moderate)     Past Surgical History  Procedure Date  . Cholecystectomy   . Total knee arthroplasty   . Vasectomy   . Lung surgery   . Icd placement     Medtronic Protecta XT CRT-D    Social History [name] reports that he quit smoking about 47 years ago. His smoking use included Cigarettes. He has a 6 pack-year smoking history. He has never used smokeless tobacco. [name] reports that he does not drink alcohol.  Review of Systems Negative except as outlined.  Physical Examination Filed Vitals:   10/04/12 1410  BP: 173/81  Pulse: 70   Filed Weights   10/04/12 1401  Weight: 236 lb 12.8 oz (107.412 kg)    HEENT: Conjunctiva and lids normal, oropharynx with moist mucosa.  Neck: Supple, no elevated JVP with increased girth.  Cardiac: Regular rate and rhythm, no S3.  Thorax: Well-healed left upper chest device pocket site.  Abdomen: Obese, nontender, bowel sounds present.  Skin: Warm  and dry.  Extremities: Trace ankle edema.  Musculoskeletal: No kyphosis.  Neuropsychiatric: Alert and oriented x3, affect appropriate.   Problem List and Plan   CAD, NATIVE VESSEL Symptomatically stable on medical therapy. Refills provided. Continue observation. Encouraged weight loss and regular activity.  SYSTOLIC HEART FAILURE, CHRONIC Symptomatically stable. Weight is up, although seems to be more related to caloric intake and diabetes. No changes made to current regimen.  ICD-Medtronic No device discharges, followed by Dr. Johney Frame.  HYPERLIPIDEMIA-MIXED Statin intolerance. Needs to work on diet and weight loss.  ESSENTIAL HYPERTENSION, BENIGN We discussed diet, sodium restriction, and weight loss. I encouraged him to keep a close eye on blood pressure at home. We can always further advance hydralazine.    Jonelle Sidle, M.D., F.A.C.C.

## 2012-10-04 NOTE — Assessment & Plan Note (Signed)
No device discharges, followed by Dr. Allred. 

## 2012-10-04 NOTE — Assessment & Plan Note (Signed)
We discussed diet, sodium restriction, and weight loss. I encouraged him to keep a close eye on blood pressure at home. We can always further advance hydralazine.

## 2012-10-04 NOTE — Assessment & Plan Note (Signed)
Symptomatically stable on medical therapy. Refills provided. Continue observation. Encouraged weight loss and regular activity.

## 2012-10-04 NOTE — Assessment & Plan Note (Signed)
Statin intolerance. Needs to work on diet and weight loss.

## 2012-10-04 NOTE — Assessment & Plan Note (Signed)
Symptomatically stable. Weight is up, although seems to be more related to caloric intake and diabetes. No changes made to current regimen.

## 2012-10-04 NOTE — Patient Instructions (Addendum)

## 2012-11-28 ENCOUNTER — Encounter: Payer: Medicare Other | Admitting: *Deleted

## 2012-12-05 ENCOUNTER — Encounter: Payer: Self-pay | Admitting: *Deleted

## 2012-12-08 ENCOUNTER — Telehealth: Payer: Self-pay | Admitting: Internal Medicine

## 2012-12-08 NOTE — Telephone Encounter (Signed)
Instructions reviewed for remote transmission and he will send later today.

## 2012-12-08 NOTE — Telephone Encounter (Signed)
Pt calling re letter re device check

## 2013-01-01 ENCOUNTER — Other Ambulatory Visit: Payer: Self-pay | Admitting: Cardiology

## 2013-01-03 ENCOUNTER — Telehealth: Payer: Self-pay | Admitting: Cardiology

## 2013-01-03 MED ORDER — FELODIPINE ER 10 MG PO TB24: ORAL_TABLET | ORAL | Status: DC

## 2013-01-03 NOTE — Telephone Encounter (Signed)
felodipine (PLENDIL) 10 MG 24 hr tablet  [facility] [place]

## 2013-01-29 ENCOUNTER — Other Ambulatory Visit: Payer: Self-pay | Admitting: Cardiology

## 2013-01-30 NOTE — Telephone Encounter (Signed)
Noted pt has recall in chart to see MD SM on 03-2013, sent enough to last pt until upcoming OV,via escribe

## 2013-02-05 ENCOUNTER — Other Ambulatory Visit: Payer: Self-pay | Admitting: Cardiology

## 2013-02-12 ENCOUNTER — Other Ambulatory Visit: Payer: Self-pay | Admitting: Neurology

## 2013-02-14 ENCOUNTER — Other Ambulatory Visit: Payer: Self-pay | Admitting: Cardiology

## 2013-03-06 ENCOUNTER — Other Ambulatory Visit: Payer: Self-pay | Admitting: Cardiology

## 2013-03-06 MED ORDER — FELODIPINE ER 10 MG PO TB24: ORAL_TABLET | ORAL | Status: DC

## 2013-03-06 MED ORDER — CLOPIDOGREL BISULFATE 75 MG PO TABS: 75.0000 mg | ORAL_TABLET | Freq: Every day | ORAL | Status: DC

## 2013-03-12 ENCOUNTER — Other Ambulatory Visit: Payer: Self-pay | Admitting: Cardiology

## 2013-03-13 ENCOUNTER — Other Ambulatory Visit: Payer: Self-pay | Admitting: *Deleted

## 2013-03-13 MED ORDER — FUROSEMIDE 20 MG PO TABS: ORAL_TABLET | ORAL | Status: DC

## 2013-04-10 ENCOUNTER — Other Ambulatory Visit: Payer: Self-pay | Admitting: Cardiology

## 2013-04-14 ENCOUNTER — Inpatient Hospital Stay (HOSPITAL_COMMUNITY)
Admission: EM | Admit: 2013-04-14 | Discharge: 2013-04-18 | DRG: 247 | Disposition: A | Discharge status: Home / Self Care | Payer: Medicare Other | Attending: Cardiology | Admitting: Cardiology

## 2013-04-14 ENCOUNTER — Emergency Department (HOSPITAL_COMMUNITY): Payer: Medicare Other

## 2013-04-14 ENCOUNTER — Encounter (HOSPITAL_COMMUNITY): Payer: Self-pay | Admitting: *Deleted

## 2013-04-14 DIAGNOSIS — Z9861 Coronary angioplasty status: Secondary | ICD-10-CM

## 2013-04-14 DIAGNOSIS — I2510 Atherosclerotic heart disease of native coronary artery without angina pectoris: Secondary | ICD-10-CM | POA: Diagnosis present

## 2013-04-14 DIAGNOSIS — N183 Chronic kidney disease, stage 3 unspecified: Secondary | ICD-10-CM | POA: Diagnosis present

## 2013-04-14 DIAGNOSIS — I10 Essential (primary) hypertension: Secondary | ICD-10-CM

## 2013-04-14 DIAGNOSIS — E785 Hyperlipidemia, unspecified: Secondary | ICD-10-CM | POA: Diagnosis present

## 2013-04-14 DIAGNOSIS — I059 Rheumatic mitral valve disease, unspecified: Secondary | ICD-10-CM | POA: Diagnosis present

## 2013-04-14 DIAGNOSIS — Z7982 Long term (current) use of aspirin: Secondary | ICD-10-CM

## 2013-04-14 DIAGNOSIS — I129 Hypertensive chronic kidney disease with stage 1 through stage 4 chronic kidney disease, or unspecified chronic kidney disease: Secondary | ICD-10-CM | POA: Diagnosis present

## 2013-04-14 DIAGNOSIS — I447 Left bundle-branch block, unspecified: Secondary | ICD-10-CM | POA: Diagnosis present

## 2013-04-14 DIAGNOSIS — Z794 Long term (current) use of insulin: Secondary | ICD-10-CM

## 2013-04-14 DIAGNOSIS — I2589 Other forms of chronic ischemic heart disease: Secondary | ICD-10-CM | POA: Diagnosis present

## 2013-04-14 DIAGNOSIS — Z7902 Long term (current) use of antithrombotics/antiplatelets: Secondary | ICD-10-CM

## 2013-04-14 DIAGNOSIS — I509 Heart failure, unspecified: Secondary | ICD-10-CM | POA: Diagnosis present

## 2013-04-14 DIAGNOSIS — I5022 Chronic systolic (congestive) heart failure: Secondary | ICD-10-CM

## 2013-04-14 DIAGNOSIS — Z8673 Personal history of transient ischemic attack (TIA), and cerebral infarction without residual deficits: Secondary | ICD-10-CM

## 2013-04-14 DIAGNOSIS — Z96659 Presence of unspecified artificial knee joint: Secondary | ICD-10-CM

## 2013-04-14 DIAGNOSIS — D509 Iron deficiency anemia, unspecified: Secondary | ICD-10-CM | POA: Diagnosis present

## 2013-04-14 DIAGNOSIS — IMO0001 Reserved for inherently not codable concepts without codable children: Secondary | ICD-10-CM | POA: Diagnosis present

## 2013-04-14 DIAGNOSIS — Z6837 Body mass index (BMI) 37.0-37.9, adult: Secondary | ICD-10-CM

## 2013-04-14 DIAGNOSIS — I214 Non-ST elevation (NSTEMI) myocardial infarction: Principal | ICD-10-CM

## 2013-04-14 DIAGNOSIS — Z95810 Presence of automatic (implantable) cardiac defibrillator: Secondary | ICD-10-CM

## 2013-04-14 DIAGNOSIS — Z79899 Other long term (current) drug therapy: Secondary | ICD-10-CM

## 2013-04-14 HISTORY — DX: Cerebral aneurysm, nonruptured: I67.1

## 2013-04-14 HISTORY — DX: Anemia, unspecified: D64.9

## 2013-04-14 HISTORY — DX: Methicillin resistant Staphylococcus aureus infection, unspecified site: A49.02

## 2013-04-14 LAB — BASIC METABOLIC PANEL
BUN: 26 mg/dL — ABNORMAL HIGH (ref 6–23)
Chloride: 95 mEq/L — ABNORMAL LOW (ref 96–112)
Creatinine, Ser: 1.44 mg/dL — ABNORMAL HIGH (ref 0.50–1.35)
GFR calc Af Amer: 54 mL/min — ABNORMAL LOW (ref >90)
GFR calc non Af Amer: 47 mL/min — ABNORMAL LOW (ref >90)

## 2013-04-14 LAB — CBC
HCT: 30.7 % — ABNORMAL LOW (ref 39.0–52.0)
MCHC: 31.9 g/dL (ref 30.0–36.0)
MCV: 80.6 fL (ref 78.0–100.0)
RBC: 3.81 MIL/uL — ABNORMAL LOW (ref 4.22–5.81)
RDW: 17.1 % — ABNORMAL HIGH (ref 11.5–15.5)
WBC: 8 10*3/uL (ref 4.0–10.5)

## 2013-04-14 LAB — POCT I-STAT, CHEM 8
BUN: 28 mg/dL — ABNORMAL HIGH (ref 6–23)
Chloride: 101 mEq/L (ref 96–112)
Creatinine, Ser: 1.5 mg/dL — ABNORMAL HIGH (ref 0.50–1.35)
Sodium: 134 mEq/L — ABNORMAL LOW (ref 135–145)
TCO2: 25 mmol/L (ref 0–100)

## 2013-04-14 LAB — POCT I-STAT TROPONIN I

## 2013-04-14 MED ORDER — NITROGLYCERIN 2 % TD OINT
0.5000 [in_us] | TOPICAL_OINTMENT | Freq: Four times a day (QID) | TRANSDERMAL | Status: DC
  Administered 2013-04-15 – 2013-04-17 (×9): 0.5 [in_us] via TOPICAL
  Filled 2013-04-14: qty 30
  Filled 2013-04-14: qty 1

## 2013-04-14 MED ORDER — HEPARIN BOLUS VIA INFUSION
4000.0000 [IU] | Freq: Once | INTRAVENOUS | Status: AC
  Administered 2013-04-14: 4000 [IU] via INTRAVENOUS

## 2013-04-14 MED ORDER — NITROGLYCERIN 0.4 MG SL SUBL
0.4000 mg | SUBLINGUAL_TABLET | SUBLINGUAL | Status: DC | PRN
  Administered 2013-04-15 (×2): 0.4 mg via SUBLINGUAL
  Filled 2013-04-14: qty 75

## 2013-04-14 MED ORDER — ASPIRIN EC 81 MG PO TBEC
162.0000 mg | DELAYED_RELEASE_TABLET | Freq: Once | ORAL | Status: DC
  Filled 2013-04-14: qty 2

## 2013-04-14 MED ORDER — NITROGLYCERIN IN D5W 200-5 MCG/ML-% IV SOLN
2.0000 ug/min | Freq: Once | INTRAVENOUS | Status: DC
  Filled 2013-04-14: qty 250

## 2013-04-14 MED ORDER — HEPARIN (PORCINE) IN NACL 100-0.45 UNIT/ML-% IJ SOLN
1800.0000 [IU]/h | INTRAMUSCULAR | Status: DC
  Administered 2013-04-14: 1200 [IU]/h via INTRAVENOUS
  Administered 2013-04-16 (×2): 1600 [IU]/h via INTRAVENOUS
  Filled 2013-04-14 (×7): qty 250

## 2013-04-14 MED ORDER — ASPIRIN 81 MG PO CHEW
324.0000 mg | CHEWABLE_TABLET | ORAL | Status: AC
  Administered 2013-04-14: 324 mg via ORAL
  Filled 2013-04-14: qty 4

## 2013-04-14 MED ORDER — ASPIRIN 325 MG PO TABS
325.0000 mg | ORAL_TABLET | Freq: Once | ORAL | Status: DC
  Filled 2013-04-14: qty 1

## 2013-04-14 NOTE — Consult Note (Signed)
History and Physical  Patient ID: ADRON GEISEL MRN: [id number], DOB: [birth year] Date of Encounter: 04/14/2013, 8:09 PM Primary Physician: [name], MD Primary Cardiologist: Molli Hazard  Chief Complaint: chest pain Reason for Admission: chest pain, troponin  HPI: Mr. Gartman is a 72 y/o M with history of CAD s/p prior stenting as below, DM, ICM EF 30-35% in April 2012 s/p BiV-ICD, CKD, morbid obesity, TIAs, and PAF who presented to [facility] with chest pain and was found to have initial troponin of 3.48. In 2010, his Coumadin was discontinued in lieu of starting ASA/Plavix after he required DES to RCA. Last cath was in July 2011 with PTCA to RCA for ISR. He is followed by Dr.Befakadu for CKD and does note that in the past he was asked to take IV Iron transfusions for anemia but instead elected oral iron instead. Last colonoscopy was 18 months ago and normal per patient, and he has not had any evidence of bleeding. He has had intermittent chest pain for the past week, but today around 12pm it became progressively more severe. 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 He describes it as a pressure, squeezing sensation with some radiation to his upper back. No SOB, but he did have nausea/vomiting. He took 3 SL NTG with resolution of pain then came to the ER where first troponin was elevated. He denies any ICD discharge, palpitations, LEE, orthopnea, syncope. He does endorse weight gain of 30 lbs over the past year and about 5lbs in the past  pBNP 1000 and CXR nonacute but ? of possible pulm nodule. His urine output has been less brisk than usual all day, but he now feels the urge to urinate in the ER. Labs otherwise significant for BUN/Cr 26/1.44, elevated glucose, Hgb 9.8 normocytic stable from prior values. He is chest pain free. EKG shows V-paced rhythm.  He had a root canal yesterday morning without complication. He was placed on Amoxicillin. He did not require any  interruption in his ASA/Plavix for this procedure.  Past Medical History  Diagnosis Date  . Type 2 diabetes mellitus   . Coronary atherosclerosis of native coronary artery     a. DES to RCA 03/2009. b. s/p PTCA to RCA for ISR, 05/2010.  Marland Kitchen Hyperlipidemia   . TIA (transient ischemic attack)     Multiple TIAs  . Cardiomyopathy, ischemic     a. LVEF 25-35%.  . Morbid obesity   . Mitral regurgitation     Mild  . BPH (benign prostatic hypertrophy)   . LBBB (left bundle branch block)     s/p BiV ICD Implanted by Dr Graciela Husbands  . Paroxysmal atrial fibrillation     a. Coumadin discontinued in 2010 when the patient required ASA/Plavix for stenting.  . Chronic kidney disease, stage III (moderate)     a. Followed by Dr. Fausto Skillern.  Marland Kitchen LBBB (left bundle branch block)   . HTN (hypertension)   . Anemia     a. Pt reports h/o anemia - was offered IV iron in past by nephrologist but declined and has taken PO instead.     Surgical History:  Past Surgical History  Procedure Laterality Date  . Cholecystectomy    . Total knee arthroplasty    . Vasectomy    . Lung surgery    . Icd placement      Medtronic Protecta XT CRT-D     Home Meds: Prior to Admission medications   Medication Sig  Start Date End Date Taking? Authorizing Provider  acetaminophen (TYLENOL) 650 MG CR tablet Take 2 by mouth twice daily     Yes Historical Provider, MD  amoxicillin (AMOXIL) 500 MG capsule Take 500 mg by mouth 4 (four) times daily. Take two capsules by mouthNOW, then take one capsule by mouth 4 times daily until gone. Started medication 04-13-13   Yes Historical Provider, MD  aspirin 325 MG tablet Take 325 mg by mouth daily.     Yes Historical Provider, MD  carvedilol (COREG) 6.25 MG tablet TAKE ONE TABLET BY MOUTH TWICE DAILY WITH MEALS 01/29/13  Yes Jonelle Sidle, MD  clopidogrel (PLAVIX) 75 MG tablet Take 1 tablet (75 mg total) by mouth daily. 03/06/13  Yes Jonelle Sidle, MD  felodipine (PLENDIL) 10 MG 24 hr  tablet TAKE ONE TABLET BY MOUTH DAILY 03/06/13  Yes Jonelle Sidle, MD  furosemide (LASIX) 20 MG tablet Take 20 mg by mouth 2 (two) times daily.   Yes Historical Provider, MD  hydrALAZINE (APRESOLINE) 25 MG tablet Take 1 tablet (25 mg total) by mouth 2 (two) times daily. 10/04/12  Yes Jonelle Sidle, MD  insulin lispro protamine-insulin lispro (HUMALOG 75/25) (75-25) 100 UNIT/ML SUSP Inject 20 Units into the skin. After every meal   Yes Historical Provider, MD  iron polysaccharides (NIFEREX) 150 MG capsule Take 150 mg by mouth daily.   Yes Historical Provider, MD  l-methylfolate-B6-B12 (METANX) 3-35-2 MG TABS Take 1 tablet by mouth every other day.   Yes Historical Provider, MD  L-Methylfolate-B6-B12 (NEURPATH-B) 3-35-2 MG TABS Take one by mouth twice daily     Yes Historical Provider, MD  Linagliptin (TRADJENTA PO) Take 1 tablet by mouth daily.   Yes Historical Provider, MD  nitroGLYCERIN (NITROSTAT) 0.4 MG SL tablet Place 0.4 mg under the tongue every 5 (five) minutes as needed for chest pain.   Yes Historical Provider, MD  pyridoxine (B-6) 50 MG tablet Take 50 mg by mouth daily.     Yes Historical Provider, MD  Zinc 50 MG CAPS Take by mouth daily.     Yes Historical Provider, MD    Allergies:  Allergies  Allergen Reactions  . Codeine     REACTION: blisters, but able to take hydrocodone    History   Social History  . Marital Status: Married    Spouse Name: N/A    Number of Children: N/A  . Years of Education: N/A   Occupational History  . Not on file.   Social History Main Topics  . Smoking status: Former Smoker -- 1.00 packs/day for 6 years    Types: Cigarettes    Quit date: 11/16/1964  . Smokeless tobacco: Never Used  . Alcohol Use: Yes     Comment: once/month  . Drug Use: No  . Sexually Active: Not on file   Other Topics Concern  . Not on file   Social History Narrative  . No narrative on file     Family History  Problem Relation Age of Onset  . Heart  disease Other     family h/o premature cardiovascular disease    Review of Systems General: negative for chills, fever, night sweats. 30lb weight gain in 1 yr. 5 lb weight gain 1 week  Cardiovascular: see above. No orthopnea/PND, LEE Dermatological: negative for rash Respiratory: negative for cough or wheezing Urologic: negative for hematuria Abdominal: negative for diarrhea, bright red blood per rectum, melena, or hematemesis Neurologic: negative for visual changes, syncope, or  dizziness All other systems reviewed and are otherwise negative except as noted above.  Labs:   Lab Results  Component Value Date   WBC 8.0 04/14/2013   HGB 10.9* 04/14/2013   HCT 32.0* 04/14/2013   MCV 80.6 04/14/2013   PLT 216 04/14/2013    Recent Labs Lab 04/14/13 1721 04/14/13 1858  NA 130* 134*  K 4.2 4.2  CL 95* 101  CO2 24  --   BUN 26* 28*  CREATININE 1.44* 1.50*  CALCIUM 9.1  --   GLUCOSE 245* 235*  troponin 3.48  Lab Results  Component Value Date   CHOL 192 12/28/2007   HDL 30.3* 12/28/2007   TRIG 247* 12/28/2007    Radiology/Studies:  Dg Chest 2 View 04/14/2013   *RADIOLOGY REPORT*  Clinical Data: Chest pain.  History of congestive heart failure and diabetes.  CHEST - 2 VIEW  Comparison: Portable examination 05/07/2011.  Two-view examination 05/06/2011.  Findings: Left subclavian AICD appears stable with leads in the right atrium, right ventricle and coronary sinus.  The heart is mildly enlarged.  There is an ill-defined nodular density in the right perihilar region which appears more conspicuous than on the prior two-view examination.  This is not clearly seen on the lateral view.  The left lung is clear.  Some pleural thickening or extrapleural fat deposition laterally on the right is stable. There is no significant pleural effusion.  The osseous structures appear unchanged.  IMPRESSION:  1.  No acute cardiopulmonary process identified. 2.  Difficult to exclude a developing nodule in the  right perihilar region.  This could be due to scarring and/or vascular structures. This could be further evaluated with CT or short-term follow-up.   Original Report Authenticated By: Carey Bullocks, M.D.    EKG: NSR ventricular paced rhythm 76bpm nonspecific ST-T changes  Physical Exam: Blood pressure 170/64, pulse 72, temperature 97.9 F (36.6 C), temperature source Oral, resp. rate 18, SpO2 95.00%. General: Well developed, well nourished obese WM in no acute distress. Head: Normocephalic, atraumatic, sclera non-icteric, no xanthomas, nares are without discharge.  Neck: Negative for carotid bruits. JVD not elevated. Lungs: Clear bilaterally to auscultation without wheezes, rales, or rhonchi. Breathing is unlabored. Heart: RRR with S1 S2. No murmurs, rubs, or gallops appreciated. Abdomen: Soft, non-tender, non-distended with normoactive bowel sounds. No hepatomegaly. No rebound/guarding. No obvious abdominal masses. Msk:  Strength and tone appear normal for age. Extremities: No clubbing or cyanosis. No edema.  Distal pedal pulses are 2+ and equal bilaterally. Neuro: Alert and oriented X 3. No focal deficit. No facial asymmetry. Moves all extremities spontaneously. Psych:  Responds to questions appropriately with a normal affect.   ASSESSMENT AND PLAN:   1. NSTEMI with prior history of CAD - admit, cycle enzymes, place NTG paste, add IV heparin. Probable cath Monday if his kidney function remains stable. Add statin and check lipids. 2. Ischemic cardiomyopathy EF 30-35% in 2012 s/p BiV-ICD - although weight is up, exam appears euvolemic. Not on ACEI presumably due to renal function. Will continue home Lasix through tonight, though we may consider adjusting this over the weekend in prep for upcoming cath. 3. Chronic kidney disease stage III - follow. 4. Chronic anemia, ? Secondary to #3 - obtain iron panel and hemoccult. May need discussion with nephrology about Epo/Iron. 5. HTN - BP elevated.  Place NTG paste and follow with home med regimen. Can add PRN BB tonight. 5. Paroxysmal atrial fibrillation - quiescent, symptom-wise. Coumadin was discontinued in 2010 when  he received a stent and required ASA/Plavix. Has high CHADS2VASc score - this should be discussed in AM with primary cardiologist, but will have to taken into account possible need for repeat stenting this admission. 6. History of remote TIAs - no recent events. 7. Diabetes mellitus - CBG quite high. Check A1C and add SSI. 8. Possible pulm nodule on CXR - f/u PCP for repeat imaging. 9. Recent root canal - continue amoxicillin through 6/4.  Signed, Ronie Spies PA-C 04/14/2013, 8:09 PM Seen in ER with Ronie Spies, PA-C. Agree with her assessment and plan. My exam confirms that he appears to be euvolemic so we may need to adjust lasix downward over the weekend so that renal function will allow cath Monday. Lungs are clear.  No edema. EKG shows NSR with ventricular paced rhythm so not helpful in diagnosing active ischemia.

## 2013-04-14 NOTE — ED Notes (Signed)
Pt reports mid and upper chest pains, increases with exertion. Reports hx of chf and having recent weight gain, mild sob. Airway intact at triage, ekg being done.

## 2013-04-14 NOTE — Progress Notes (Signed)
ANTICOAGULATION CONSULT NOTE - Initial Consult  Pharmacy Consult for heparin Indication: chest pain/ACS  Allergies  Allergen Reactions  . Codeine     REACTION: blisters    Patient Measurements:   Heparin Dosing Weight: 93.3  Vital Signs: Temp: 97.9 F (36.6 C) (05/30 1722) Temp src: Oral (05/30 1722) BP: 170/64 mmHg (05/30 1722) Pulse Rate: 72 (05/30 1722)  Labs:  Recent Labs  04/14/13 1721 04/14/13 1858  HGB 9.8* 10.9*  HCT 30.7* 32.0*  PLT 216  --   LABPROT 13.4  --   INR 1.03  --   CREATININE 1.44* 1.50*    The CrCl is unknown because both a height and weight (above a minimum accepted value) are required for this calculation.   Medical History: Past Medical History  Diagnosis Date  . Type 2 diabetes mellitus   . Coronary atherosclerosis of native coronary artery     DES RCA 5/10, PTCA RCA (ISR), 7/11  . Hyperlipidemia   . TIA (transient ischemic attack)   . Cardiomyopathy, ischemic     LVEF 25-35%  . Morbid obesity   . Mitral regurgitation     Mild  . BPH (benign prostatic hypertrophy)   . LBBB (left bundle branch block)     s/p BiV ICD Implanted by Dr Graciela Husbands  . Paroxysmal atrial fibrillation   . Chronic systolic dysfunction of left ventricle   . Chronic kidney disease, stage III (moderate)     Medications:  Infusions:  . heparin    . heparin      Assessment: 72 yom with significant cardiac history to start IV heparin for CP. Troponin and BNP are elevated. Pt is mildly anemic but plts are WNL. He is on aspirin + plavix PTA.   Goal of Therapy:  Heparin level 0.3-0.7 units/ml Monitor platelets by anticoagulation protocol: Yes   Plan:  1. Heparin bolus 4000 units IV x 1 2. Heparin gtt 1200 units/hr 3. Check an 8 hour heparin level 4. Daily heparin level and CBC  Icel Castles, Drake Leach 04/14/2013,7:40 PM

## 2013-04-14 NOTE — Progress Notes (Signed)
Tradjenta home dose not known. Will ask pharmacy to clarify. Dayna Dunn PA-C

## 2013-04-14 NOTE — ED Provider Notes (Signed)
History     CSN: [id number]  Arrival date & time 04/14/13  1713   First MD Initiated Contact with Patient 04/14/13 1752      Chief Complaint  Patient presents with  . Chest Pain    HPI 72 year old male with multiple medical comorbidities including coronary artery disease status post stents, diabetes, ischemic cardiomyopathy with an ejection fraction of 30-35%, status post ICD pacemaker presents with chest pain. Onset of this pain was about a week ago. He's been having pain intermittently for the past week off and on several times per day. Today approximately 5 hours prior to arrival his pain became worse. Pain is located to the service chest and radiates to the center of his back. It is described as a pressure, squeezing pain it was exertional in that it became worse while he was loading his struck, exerting himself significantly. He had nausea and vomiting. No dyspnea. He took 3 sublingual nitroglycerin and his pain Improved significantly but did not completely abate. He endorses weight gain of approximately 5 pounds in the past week. However he denies orthopnea, paroxysmal nocturnal dyspnea, or peripheral edema.  Past Medical History  Diagnosis Date  . Type 2 diabetes mellitus   . Coronary atherosclerosis of native coronary artery     a. DES to RCA 03/2009. b. s/p PTCA to RCA for ISR, 05/2010.  Marland Kitchen Hyperlipidemia   . TIA (transient ischemic attack)     Multiple TIAs  . Cardiomyopathy, ischemic     a. LVEF 25-35%.  . Morbid obesity   . Mitral regurgitation     Mild  . BPH (benign prostatic hypertrophy)   . LBBB (left bundle branch block)     s/p BiV ICD Implanted by Dr Graciela Husbands  . Paroxysmal atrial fibrillation     a. Coumadin discontinued in 2010 when the patient required ASA/Plavix for stenting.  . Chronic kidney disease, stage III (moderate)     a. Followed by Dr. Fausto Skillern.  Marland Kitchen LBBB (left bundle branch block)   . HTN (hypertension)   . Anemia     a. Pt reports h/o anemia - was  offered IV iron in past by nephrologist but declined and has taken PO instead.    Past Surgical History  Procedure Laterality Date  . Cholecystectomy    . Total knee arthroplasty    . Vasectomy    . Lung surgery    . Icd placement      Medtronic Protecta XT CRT-D    Family History  Problem Relation Age of Onset  . Heart disease Other     family h/o premature cardiovascular disease    History  Substance Use Topics  . Smoking status: Former Smoker -- 1.00 packs/day for 6 years    Types: Cigarettes    Quit date: 11/16/1964  . Smokeless tobacco: Never Used  . Alcohol Use: Yes     Comment: once/month      Review of Systems  Constitutional: Negative for fever and chills.  HENT: Negative for congestion, rhinorrhea, neck pain and neck stiffness.   Eyes: Negative for visual disturbance.  Respiratory: Negative for cough and shortness of breath.   Cardiovascular: Positive for chest pain. Negative for leg swelling.  Gastrointestinal: Positive for nausea and vomiting. Negative for abdominal pain and diarrhea.  Genitourinary: Negative for dysuria, urgency, frequency, flank pain and difficulty urinating.  Musculoskeletal: Negative for back pain.  Skin: Negative for rash.  Neurological: Negative for syncope, weakness, numbness and headaches.  All other systems  reviewed and are negative.    Allergies  Codeine  Home Medications   Current Outpatient Rx  Name  Route  Sig  Dispense  Refill  . acetaminophen (TYLENOL) 650 MG CR tablet      Take 2 by mouth twice daily           . amoxicillin (AMOXIL) 500 MG capsule   Oral   Take 500 mg by mouth 4 (four) times daily. Take two capsules by mouthNOW, then take one capsule by mouth 4 times daily until gone. Started medication 04-13-13         . aspirin 325 MG tablet   Oral   Take 325 mg by mouth daily.           . carvedilol (COREG) 6.25 MG tablet      TAKE ONE TABLET BY MOUTH TWICE DAILY WITH MEALS   60 tablet   2   .  clopidogrel (PLAVIX) 75 MG tablet   Oral   Take 1 tablet (75 mg total) by mouth daily.   30 tablet   4     Call Dr. Diona Browner office and schedule 6 month appo ...   . felodipine (PLENDIL) 10 MG 24 hr tablet      TAKE ONE TABLET BY MOUTH DAILY   30 tablet   4     Call Dr. Diona Browner office and schedule 6 month appo ...   . furosemide (LASIX) 20 MG tablet   Oral   Take 20 mg by mouth 2 (two) times daily.         . hydrALAZINE (APRESOLINE) 25 MG tablet   Oral   Take 1 tablet (25 mg total) by mouth 2 (two) times daily.   60 tablet   6   . insulin lispro protamine-insulin lispro (HUMALOG 75/25) (75-25) 100 UNIT/ML SUSP   Subcutaneous   Inject 20 Units into the skin. After every meal         . iron polysaccharides (NIFEREX) 150 MG capsule   Oral   Take 150 mg by mouth daily.         Marland Kitchen l-methylfolate-B6-B12 (METANX) 3-35-2 MG TABS   Oral   Take 1 tablet by mouth every other day.         Marland Kitchen L-Methylfolate-B6-B12 (NEURPATH-B) 3-35-2 MG TABS      Take one by mouth twice daily           . Linagliptin (TRADJENTA PO)   Oral   Take 1 tablet by mouth daily.         . nitroGLYCERIN (NITROSTAT) 0.4 MG SL tablet   Sublingual   Place 0.4 mg under the tongue every 5 (five) minutes as needed for chest pain.         Marland Kitchen pyridoxine (B-6) 50 MG tablet   Oral   Take 50 mg by mouth daily.           . Zinc 50 MG CAPS   Oral   Take by mouth daily.             BP 170/64  Pulse 72  Temp(Src) 97.9 F (36.6 C) (Oral)  Resp 18  SpO2 95%  Physical Exam  Nursing note and vitals reviewed. Constitutional: He is oriented to person, place, and time. He appears well-developed and well-nourished. No distress.  HENT:  Head: Normocephalic and atraumatic.  Mouth/Throat: Oropharynx is clear and moist.  Eyes: Conjunctivae and EOM are normal. Pupils are equal, round, and reactive to  light. No scleral icterus.  Neck: Normal range of motion. Neck supple. No JVD present.   Cardiovascular: Normal rate, regular rhythm, normal heart sounds and intact distal pulses.  Exam reveals no gallop and no friction rub.   No murmur heard. Pulmonary/Chest: Effort normal and breath sounds normal. No respiratory distress. He has no wheezes. He has no rales.  Abdominal: Soft. Bowel sounds are normal. He exhibits no distension. There is no tenderness. There is no rebound and no guarding.  Musculoskeletal: He exhibits no edema.  Neurological: He is alert and oriented to person, place, and time. No cranial nerve deficit. He exhibits normal muscle tone. Coordination normal.  Skin: Skin is warm and dry. He is not diaphoretic.    ED Course  Procedures (including critical care time)  Labs Reviewed  CBC - Abnormal; Notable for the following:    RBC 3.81 (*)    Hemoglobin 9.8 (*)    HCT 30.7 (*)    MCH 25.7 (*)    RDW 17.1 (*)    All other components within normal limits  BASIC METABOLIC PANEL - Abnormal; Notable for the following:    Sodium 130 (*)    Chloride 95 (*)    Glucose, Bld 245 (*)    BUN 26 (*)    Creatinine, Ser 1.44 (*)    GFR calc non Af Amer 47 (*)    GFR calc Af Amer 54 (*)    All other components within normal limits  PRO B NATRIURETIC PEPTIDE - Abnormal; Notable for the following:    Pro B Natriuretic peptide (BNP) 1020.0 (*)    All other components within normal limits  POCT I-STAT, CHEM 8 - Abnormal; Notable for the following:    Sodium 134 (*)    BUN 28 (*)    Creatinine, Ser 1.50 (*)    Glucose, Bld 235 (*)    Hemoglobin 10.9 (*)    HCT 32.0 (*)    All other components within normal limits  POCT I-STAT TROPONIN I - Abnormal; Notable for the following:    Troponin i, poc 3.48 (*)    All other components within normal limits  PROTIME-INR   Dg Chest 2 View  04/14/2013   *RADIOLOGY REPORT*  Clinical Data: Chest pain.  History of congestive heart failure and diabetes.  CHEST - 2 VIEW  Comparison: Portable examination 05/07/2011.  Two-view  examination 05/06/2011.  Findings: Left subclavian AICD appears stable with leads in the right atrium, right ventricle and coronary sinus.  The heart is mildly enlarged.  There is an ill-defined nodular density in the right perihilar region which appears more conspicuous than on the prior two-view examination.  This is not clearly seen on the lateral view.  The left lung is clear.  Some pleural thickening or extrapleural fat deposition laterally on the right is stable. There is no significant pleural effusion.  The osseous structures appear unchanged.  IMPRESSION:  1.  No acute cardiopulmonary process identified. 2.  Difficult to exclude a developing nodule in the right perihilar region.  This could be due to scarring and/or vascular structures. This could be further evaluated with CT or short-term follow-up.   Original Report Authenticated By: Carey Bullocks, M.D.     1. NSTEMI (non-ST elevated myocardial infarction)       MDM  72 year old male with coronary artery disease and ischemic cardiomyopathy with a biventricular pacer defibrillator here with chest pain. Has been having stuttering chest pain for one week, worsened today about  5 hours prior to arrival with an exertional component as well as nausea and vomiting. Relieved with sublingual nitroglycerin.  On arrival he is hypertensive to 170/64, vitals are otherwise within normal limits. He is in no acute distress. Cardiopulmonary exam is unremarkable. Chest x-ray demonstrates no acute cardiopulmonary process.  EKG demonstrates a ventricular paced rhythm with no acute changes noted. Similar prior.  Troponin is 3.48. BUN 28, creatinine 1.5, hemoglobin 10.9  Patient received aspirin, heparin in the emergency department and pain was well-controlled with nitroglycerin. He was admitted to the cardiology service.  Toney Sang, MD 04/15/13 [phone]

## 2013-04-15 ENCOUNTER — Encounter (HOSPITAL_COMMUNITY): Payer: Self-pay | Admitting: Cardiology

## 2013-04-15 DIAGNOSIS — I5022 Chronic systolic (congestive) heart failure: Secondary | ICD-10-CM

## 2013-04-15 DIAGNOSIS — I517 Cardiomegaly: Secondary | ICD-10-CM

## 2013-04-15 LAB — GLUCOSE, CAPILLARY
Glucose-Capillary: 183 mg/dL — ABNORMAL HIGH (ref 70–99)
Glucose-Capillary: 178 mg/dL — ABNORMAL HIGH (ref 70–99)
Glucose-Capillary: 143 mg/dL — ABNORMAL HIGH (ref 70–99)

## 2013-04-15 LAB — HEPATIC FUNCTION PANEL
ALT: 21 U/L (ref 0–53)
Albumin: 3.3 g/dL — ABNORMAL LOW (ref 3.5–5.2)
Alkaline Phosphatase: 60 U/L (ref 39–117)
Total Bilirubin: 0.2 mg/dL — ABNORMAL LOW (ref 0.3–1.2)
Total Protein: 6.8 g/dL (ref 6.0–8.3)

## 2013-04-15 LAB — LIPID PANEL
Cholesterol: 157 mg/dL (ref 0–200)
Triglycerides: 282 mg/dL — ABNORMAL HIGH (ref <150)
VLDL: 56 mg/dL — ABNORMAL HIGH (ref 0–40)

## 2013-04-15 LAB — CBC
HCT: 29.6 % — ABNORMAL LOW (ref 39.0–52.0)
Hemoglobin: 9.4 g/dL — ABNORMAL LOW (ref 13.0–17.0)
MCH: 25.6 pg — ABNORMAL LOW (ref 26.0–34.0)
MCV: 80.7 fL (ref 78.0–100.0)
Platelets: 223 10*3/uL (ref 150–400)
RBC: 3.67 MIL/uL — ABNORMAL LOW (ref 4.22–5.81)

## 2013-04-15 LAB — HEPARIN LEVEL (UNFRACTIONATED): Heparin Unfractionated: 0.4 IU/mL (ref 0.30–0.70)

## 2013-04-15 LAB — TROPONIN I
Troponin I: >20 ng/mL (ref <0.30)
Troponin I: 18.09 ng/mL (ref <0.30)

## 2013-04-15 LAB — BASIC METABOLIC PANEL
BUN: 22 mg/dL (ref 6–23)
BUN: 21 mg/dL (ref 6–23)
CO2: 23 mEq/L (ref 19–32)
Calcium: 9 mg/dL (ref 8.4–10.5)
Creatinine, Ser: 1.21 mg/dL (ref 0.50–1.35)
GFR calc Af Amer: 67 mL/min — ABNORMAL LOW (ref >90)
GFR calc non Af Amer: 58 mL/min — ABNORMAL LOW (ref >90)
GFR calc non Af Amer: 58 mL/min — ABNORMAL LOW (ref >90)
Glucose, Bld: 225 mg/dL — ABNORMAL HIGH (ref 70–99)
Glucose, Bld: 209 mg/dL — ABNORMAL HIGH (ref 70–99)

## 2013-04-15 LAB — VITAMIN B12: Vitamin B-12: >2000 pg/mL — ABNORMAL HIGH (ref 211–911)

## 2013-04-15 LAB — FOLATE: Folate: >20 ng/mL

## 2013-04-15 LAB — RETICULOCYTES: RBC.: 4.05 MIL/uL — ABNORMAL LOW (ref 4.22–5.81)

## 2013-04-15 LAB — IRON AND TIBC: Saturation Ratios: 7 % — ABNORMAL LOW (ref 20–55)

## 2013-04-15 MED ORDER — INSULIN ASPART 100 UNIT/ML ~~LOC~~ SOLN
0.0000 [IU] | Freq: Every day | SUBCUTANEOUS | Status: DC
Start: 2013-04-15 — End: 2013-04-18
  Administered 2013-04-15: 2 [IU] via SUBCUTANEOUS

## 2013-04-15 MED ORDER — FELODIPINE ER 10 MG PO TB24
10.0000 mg | ORAL_TABLET | Freq: Every day | ORAL | Status: DC
  Administered 2013-04-15 – 2013-04-18 (×4): 10 mg via ORAL
  Filled 2013-04-15 (×4): qty 1

## 2013-04-15 MED ORDER — ACETAMINOPHEN ER 650 MG PO TBCR: 1300.0000 mg | EXTENDED_RELEASE_TABLET | Freq: Two times a day (BID) | ORAL | Status: DC | PRN

## 2013-04-15 MED ORDER — HEPARIN BOLUS VIA INFUSION
3000.0000 [IU] | Freq: Once | INTRAVENOUS | Status: AC
  Administered 2013-04-15: 3000 [IU] via INTRAVENOUS
  Filled 2013-04-15: qty 3000

## 2013-04-15 MED ORDER — SODIUM CHLORIDE 0.9 % IJ SOLN
3.0000 mL | INTRAMUSCULAR | Status: DC | PRN
  Administered 2013-04-18: 3 mL via INTRAVENOUS

## 2013-04-15 MED ORDER — HYDRALAZINE HCL 25 MG PO TABS
25.0000 mg | ORAL_TABLET | Freq: Two times a day (BID) | ORAL | Status: DC
Start: 2013-04-15 — End: 2013-04-15
  Filled 2013-04-15 (×2): qty 1

## 2013-04-15 MED ORDER — CARVEDILOL 6.25 MG PO TABS
6.2500 mg | ORAL_TABLET | Freq: Two times a day (BID) | ORAL | Status: DC
  Administered 2013-04-15 – 2013-04-18 (×8): 6.25 mg via ORAL
  Filled 2013-04-15 (×10): qty 1

## 2013-04-15 MED ORDER — ACETAMINOPHEN 500 MG PO TABS
1000.0000 mg | ORAL_TABLET | Freq: Four times a day (QID) | ORAL | Status: DC | PRN
  Administered 2013-04-15 – 2013-04-18 (×3): 1000 mg via ORAL
  Filled 2013-04-15 (×2): qty 2

## 2013-04-15 MED ORDER — VITAMIN B-6 50 MG PO TABS
50.0000 mg | ORAL_TABLET | Freq: Every day | ORAL | Status: DC
  Administered 2013-04-15 – 2013-04-18 (×4): 50 mg via ORAL
  Filled 2013-04-15 (×4): qty 1

## 2013-04-15 MED ORDER — METOPROLOL TARTRATE 1 MG/ML IV SOLN: 5.0000 mg | Freq: Once | INTRAVENOUS | Status: AC | PRN

## 2013-04-15 MED ORDER — ATORVASTATIN CALCIUM 80 MG PO TABS
80.0000 mg | ORAL_TABLET | Freq: Every day | ORAL | Status: DC
  Administered 2013-04-15 – 2013-04-17 (×3): 80 mg via ORAL
  Filled 2013-04-15 (×4): qty 1

## 2013-04-15 MED ORDER — SODIUM CHLORIDE 0.9 % IJ SOLN
3.0000 mL | Freq: Two times a day (BID) | INTRAMUSCULAR | Status: DC
  Administered 2013-04-16 – 2013-04-17 (×2): 3 mL via INTRAVENOUS

## 2013-04-15 MED ORDER — SODIUM CHLORIDE 0.9 % IV SOLN: 250.0000 mL | INTRAVENOUS | Status: DC | PRN

## 2013-04-15 MED ORDER — INSULIN ASPART PROT & ASPART (70-30 MIX) 100 UNIT/ML ~~LOC~~ SUSP
20.0000 [IU] | Freq: Three times a day (TID) | SUBCUTANEOUS | Status: DC
  Administered 2013-04-15 – 2013-04-18 (×9): 20 [IU] via SUBCUTANEOUS
  Filled 2013-04-15: qty 10

## 2013-04-15 MED ORDER — AMOXICILLIN 500 MG PO CAPS
500.0000 mg | ORAL_CAPSULE | Freq: Four times a day (QID) | ORAL | Status: DC
  Administered 2013-04-15 – 2013-04-18 (×14): 500 mg via ORAL
  Filled 2013-04-15 (×17): qty 1

## 2013-04-15 MED ORDER — CLOPIDOGREL BISULFATE 75 MG PO TABS
75.0000 mg | ORAL_TABLET | Freq: Every day | ORAL | Status: DC
  Administered 2013-04-15 – 2013-04-18 (×4): 75 mg via ORAL
  Filled 2013-04-15 (×5): qty 1

## 2013-04-15 MED ORDER — INSULIN ASPART 100 UNIT/ML ~~LOC~~ SOLN
0.0000 [IU] | Freq: Three times a day (TID) | SUBCUTANEOUS | Status: DC
  Administered 2013-04-15 (×3): 2 [IU] via SUBCUTANEOUS
  Administered 2013-04-16: 3 [IU] via SUBCUTANEOUS
  Administered 2013-04-16: 1 [IU] via SUBCUTANEOUS
  Administered 2013-04-16: 2 [IU] via SUBCUTANEOUS
  Administered 2013-04-17 (×2): 1 [IU] via SUBCUTANEOUS
  Administered 2013-04-17 – 2013-04-18 (×2): 2 [IU] via SUBCUTANEOUS
  Administered 2013-04-18: 1 [IU] via SUBCUTANEOUS

## 2013-04-15 MED ORDER — L-METHYLFOLATE-B6-B12 3-35-2 MG PO TABS
1.0000 | ORAL_TABLET | Freq: Two times a day (BID) | ORAL | Status: DC
  Administered 2013-04-15 – 2013-04-18 (×8): 1 via ORAL
  Filled 2013-04-15 (×9): qty 1

## 2013-04-15 MED ORDER — POLYSACCHARIDE IRON COMPLEX 150 MG PO CAPS
150.0000 mg | ORAL_CAPSULE | Freq: Every day | ORAL | Status: DC
  Administered 2013-04-16 – 2013-04-18 (×3): 150 mg via ORAL
  Filled 2013-04-15 (×5): qty 1

## 2013-04-15 MED ORDER — ASPIRIN 325 MG PO TABS
325.0000 mg | ORAL_TABLET | Freq: Every day | ORAL | Status: DC
  Administered 2013-04-15 – 2013-04-16 (×2): 325 mg via ORAL
  Filled 2013-04-15 (×3): qty 1

## 2013-04-15 MED ORDER — L-METHYLFOLATE-B6-B12 3-35-2 MG PO TABS: 1.0000 | ORAL_TABLET | ORAL | Status: DC

## 2013-04-15 MED ORDER — HYDRALAZINE HCL 25 MG PO TABS
25.0000 mg | ORAL_TABLET | Freq: Three times a day (TID) | ORAL | Status: DC
  Administered 2013-04-15 – 2013-04-18 (×10): 25 mg via ORAL
  Filled 2013-04-15 (×13): qty 1

## 2013-04-15 MED ORDER — ATORVASTATIN CALCIUM 40 MG PO TABS
40.0000 mg | ORAL_TABLET | Freq: Every day | ORAL | Status: DC
  Administered 2013-04-15: 40 mg via ORAL
  Filled 2013-04-15 (×2): qty 1

## 2013-04-15 MED ORDER — FUROSEMIDE 20 MG PO TABS
20.0000 mg | ORAL_TABLET | Freq: Two times a day (BID) | ORAL | Status: DC
  Administered 2013-04-15: 20 mg via ORAL
  Filled 2013-04-15: qty 1

## 2013-04-15 MED ORDER — ACETAMINOPHEN 500 MG PO TABS
1000.0000 mg | ORAL_TABLET | Freq: Two times a day (BID) | ORAL | Status: DC | PRN
  Administered 2013-04-15: 1000 mg via ORAL
  Filled 2013-04-15 (×2): qty 2

## 2013-04-15 MED ORDER — NEURPATH-B 3-35-2 MG PO TABS: 1.0000 | ORAL_TABLET | Freq: Two times a day (BID) | ORAL | Status: DC

## 2013-04-15 MED ORDER — ONDANSETRON HCL 4 MG/2ML IJ SOLN: 4.0000 mg | Freq: Four times a day (QID) | INTRAMUSCULAR | Status: DC | PRN

## 2013-04-15 MED ORDER — FUROSEMIDE 20 MG PO TABS
20.0000 mg | ORAL_TABLET | Freq: Two times a day (BID) | ORAL | Status: DC
  Administered 2013-04-15 – 2013-04-18 (×5): 20 mg via ORAL
  Filled 2013-04-15 (×9): qty 1

## 2013-04-15 NOTE — Progress Notes (Signed)
ANTICOAGULATION CONSULT NOTE - Follow Up Consult  Pharmacy Consult for heparin Indication: chest pain/ACS  Allergies  Allergen Reactions  . Codeine     REACTION: blisters, but able to take hydrocodone    Patient Measurements: Height: 5\' 8"  (172.7 cm) Weight: 253 lb 15.5 oz (115.2 kg) IBW/kg (Calculated) : 68.4 Heparin Dosing Weight: 93.3  Vital Signs: Temp: 97.7 F (36.5 C) (05/31 1237) Temp src: Oral (05/31 1237) BP: 119/45 mmHg (05/31 0948) Pulse Rate: 63 (05/31 0605)  Labs:  Recent Labs  04/14/13 1721 04/14/13 1858 04/15/13 0150 04/15/13 0416 04/15/13 0840 04/15/13 1400  HGB 9.8* 10.9*  --  9.4*  --   --   HCT 30.7* 32.0*  --  29.6*  --   --   PLT 216  --   --  223  --   --   LABPROT 13.4  --   --   --   --   --   INR 1.03  --   --   --   --   --   HEPARINUNFRC  --   --   --  <0.10*  --  0.40  CREATININE 1.44* 1.50* 1.21  --   --   --   TROPONINI  --   --  18.99*  --  >20.00*  --     Estimated Creatinine Clearance: 67 ml/min (by C-G formula based on Cr of 1.21).   Medical History: Past Medical History  Diagnosis Date  . Type 2 diabetes mellitus   . Coronary atherosclerosis of native coronary artery     a. DES to RCA 03/2009. b. s/p PTCA to RCA for ISR, 05/2010.  Marland Kitchen Hyperlipidemia   . TIA (transient ischemic attack)     Multiple TIAs  . Cardiomyopathy, ischemic     a. LVEF 25-35%.  . Morbid obesity   . Mitral regurgitation     Mild  . BPH (benign prostatic hypertrophy)   . LBBB (left bundle branch block)     s/p BiV ICD Implanted by Dr Graciela Husbands  . Paroxysmal atrial fibrillation     a. Coumadin discontinued in 2010 when the patient required ASA/Plavix for stenting.  . Chronic kidney disease, stage III (moderate)     a. Followed by Dr. Fausto Skillern.  Marland Kitchen LBBB (left bundle branch block)   . HTN (hypertension)   . Anemia     a. Pt reports h/o anemia - was offered IV iron in past by nephrologist but declined and has taken PO instead.  . Aneurysm, cerebral   .  MRSA (methicillin resistant Staphylococcus aureus)     Medications:  Infusions:  . heparin 1,600 Units/hr (04/15/13 0617)    Assessment: 72 yom with significant cardiac history on IV heparin for CP. Troponin and BNP are elevated. Pt is mildly anemic but plts are WNL. He is on aspirin + plavix PTA.   Heparin level (0.4) is at goal on 1600 units/hr.  Goal of Therapy:  Heparin level 0.3-0.7 units/ml Monitor platelets by anticoagulation protocol: Yes   Plan:  - Continue heparin 1600 units/hr - Daily HL, CBC - Monitor for bleeding   Franchot Erichsen 04/15/2013,12:50 PM

## 2013-04-15 NOTE — Progress Notes (Signed)
Utilization Review Completed.   Decie Verne, RN, BSN Nurse Case Manager  [phone]  

## 2013-04-15 NOTE — Progress Notes (Addendum)
Patient ID: [name], male   DOB: 25-Jul-1941, 72 y.o.   MRN: [id number]    SUBJECTIVE: No further chest pain, no dyspnea.    Marland Kitchen amoxicillin  500 mg Oral QID  . aspirin  325 mg Oral Daily  . atorvastatin  80 mg Oral q1800  . carvedilol  6.25 mg Oral BID WC  . clopidogrel  75 mg Oral Q breakfast  . felodipine  10 mg Oral Daily  . furosemide  20 mg Oral BID  . hydrALAZINE  25 mg Oral Q8H  . insulin aspart  0-5 Units Subcutaneous QHS  . insulin aspart  0-9 Units Subcutaneous TID WC  . insulin aspart protamine- aspart  20 Units Subcutaneous TID WC  . iron polysaccharides  150 mg Oral Daily  . l-methylfolate-B6-B12  1 tablet Oral BID  . nitroGLYCERIN  0.5 inch Topical Q6H  . pyridOXINE  50 mg Oral Daily  . sodium chloride  3 mL Intravenous Q12H  heparin gtt    Filed Vitals:   04/15/13 0555 04/15/13 0600 04/15/13 0605 04/15/13 0818  BP: 124/43 110/70 111/51   Pulse: 70 66 63   Temp:    97.7 F (36.5 C)  TempSrc:    Oral  Resp: 19 12 12    Height:      Weight:      SpO2: 100% 96% 98%     Intake/Output Summary (Last 24 hours) at 04/15/13 0918 Last data filed at 04/15/13 0820  Gross per 24 hour  Intake    180 ml  Output   1100 ml  Net   -920 ml    LABS: Basic Metabolic Panel:  Recent Labs  56/21/30 1721 04/14/13 1858 04/15/13 0150  NA 130* 134* 135  K 4.2 4.2 4.2  CL 95* 101 100  CO2 24  --  23  GLUCOSE 245* 235* 225*  BUN 26* 28* 22  CREATININE 1.44* 1.50* 1.21  CALCIUM 9.1  --  9.1   Liver Function Tests:  Recent Labs  04/15/13 0150  AST 111*  ALT 21  ALKPHOS 60  BILITOT 0.2*  PROT 6.8  ALBUMIN 3.3*   No results found for this basename: LIPASE, AMYLASE,  in the last 72 hours CBC:  Recent Labs  04/14/13 1721 04/14/13 1858 04/15/13 0416  WBC 8.0  --  8.9  HGB 9.8* 10.9* 9.4*  HCT 30.7* 32.0* 29.6*  MCV 80.6  --  80.7  PLT 216  --  223   Cardiac Enzymes:  Recent Labs  04/15/13 0150  TROPONINI 18.99*   BNP: No components found  with this basename: POCBNP,  D-Dimer: No results found for this basename: DDIMER,  in the last 72 hours Hemoglobin A1C: No results found for this basename: HGBA1C,  in the last 72 hours Fasting Lipid Panel:  Recent Labs  04/15/13 0150  CHOL 157  HDL 32*  LDLCALC 69  TRIG 865*  CHOLHDL 4.9   Thyroid Function Tests: No results found for this basename: TSH, T4TOTAL, FREET3, T3FREE, THYROIDAB,  in the last 72 hours Anemia Panel:  Recent Labs  04/15/13 0150  RETICCTPCT 1.8    RADIOLOGY: Dg Chest 2 View  04/14/2013   *RADIOLOGY REPORT*  Clinical Data: Chest pain.  History of congestive heart failure and diabetes.  CHEST - 2 VIEW  Comparison: Portable examination 05/07/2011.  Two-view examination 05/06/2011.  Findings: Left subclavian AICD appears stable with leads in the right atrium, right ventricle and coronary sinus.  The heart is mildly  enlarged.  There is an ill-defined nodular density in the right perihilar region which appears more conspicuous than on the prior two-view examination.  This is not clearly seen on the lateral view.  The left lung is clear.  Some pleural thickening or extrapleural fat deposition laterally on the right is stable. There is no significant pleural effusion.  The osseous structures appear unchanged.  IMPRESSION:  1.  No acute cardiopulmonary process identified. 2.  Difficult to exclude a developing nodule in the right perihilar region.  This could be due to scarring and/or vascular structures. This could be further evaluated with CT or short-term follow-up.   Original Report Authenticated By: Carey Bullocks, M.D.    PHYSICAL EXAM General: NAD Neck: Thick, no JVD, no thyromegaly or thyroid nodule.  Lungs: Clear to auscultation bilaterally with normal respiratory effort. CV: Nondisplaced PMI.  Heart regular S1/S2, no S3/S4, no murmur.  No peripheral edema.  No carotid bruit.  Normal pedal pulses.  Abdomen: Soft, nontender, no hepatosplenomegaly, no  distention.  Neurologic: Alert and oriented x 3.  Psych: Normal affect. Extremities: No clubbing or cyanosis.   TELEMETRY: Reviewed telemetry pt in NSR, BiV paced  ASSESSMENT AND PLAN: 72 yo with history of CAD, ischemic cardiomyopathy, BiV-ICD, PAF, and CKD presented with NSTEMI.  1. CAD: NSTEMI.  TnI to 19.  No further chest pain.  Continue ASA, heparin gtt, Plavix, increase atorvastatin to 80, Coreg.  LHC on Monday.   2. CKD: Creatinine 1.2 this morning (improved).  Avoid nephrotoxins.  Will need to minimize contrast with cath.  3. CHF: Chronic systolic CHF.  He does not appear volume overloaded.  No ACEI with renal dysfunction.  Continue Coreg, change hydralazine to 25 mg tid, continue nitrates.  Will get echo.  Can continue current Lasix dose for now.  4. Anemia: Stable since admission.  May be related to renal disease.  Will guaiac stool, check Fe studies/B12.  5. PAF: Not sure when this was last documented.  Has been in NSR here.  Should have device interrogation before d/c to see if he has episodes of PAF.  Would need to consider anticoagulation if AF has been detected. However, this would require a period of Plavix + anticoagulation with increased risk of bleeding in a patient who is already anemic.   [name] 04/15/2013 9:22 AM

## 2013-04-15 NOTE — ED Notes (Signed)
Report called to [facility] for room 2917

## 2013-04-15 NOTE — Significant Event (Signed)
Patient with complaints of 4/10 left sided chest pain with no other associated symptoms. 12 lead EKG obtained with no acute changes noted. Pain relieved after administration of 2 SL NTG. Vital signs stable as noted in chart.

## 2013-04-15 NOTE — Progress Notes (Signed)
ANTICOAGULATION CONSULT NOTE - Follow Up Consult  Pharmacy Consult for heparin Indication: chest pain/ACS  Allergies  Allergen Reactions  . Codeine     REACTION: blisters, but able to take hydrocodone    Patient Measurements: Height: 5\' 8"  (172.7 cm) Weight: 253 lb 15.5 oz (115.2 kg) IBW/kg (Calculated) : 68.4 Heparin Dosing Weight: 93.3  Vital Signs: Temp: 97.5 F (36.4 C) (05/31 0319) Temp src: Oral (05/31 0319) BP: 137/65 mmHg (05/31 0530) Pulse Rate: 66 (05/31 0530)  Labs:  Recent Labs  04/14/13 1721 04/14/13 1858 04/15/13 0150 04/15/13 0416  HGB 9.8* 10.9*  --  9.4*  HCT 30.7* 32.0*  --  29.6*  PLT 216  --   --  223  LABPROT 13.4  --   --   --   INR 1.03  --   --   --   HEPARINUNFRC  --   --   --  <0.10*  CREATININE 1.44* 1.50* 1.21  --   TROPONINI  --   --  18.99*  --     Estimated Creatinine Clearance: 67 ml/min (by C-G formula based on Cr of 1.21).   Medical History: Past Medical History  Diagnosis Date  . Type 2 diabetes mellitus   . Coronary atherosclerosis of native coronary artery     a. DES to RCA 03/2009. b. s/p PTCA to RCA for ISR, 05/2010.  Marland Kitchen Hyperlipidemia   . TIA (transient ischemic attack)     Multiple TIAs  . Cardiomyopathy, ischemic     a. LVEF 25-35%.  . Morbid obesity   . Mitral regurgitation     Mild  . BPH (benign prostatic hypertrophy)   . LBBB (left bundle branch block)     s/p BiV ICD Implanted by Dr Graciela Husbands  . Paroxysmal atrial fibrillation     a. Coumadin discontinued in 2010 when the patient required ASA/Plavix for stenting.  . Chronic kidney disease, stage III (moderate)     a. Followed by Dr. Fausto Skillern.  Marland Kitchen LBBB (left bundle branch block)   . HTN (hypertension)   . Anemia     a. Pt reports h/o anemia - was offered IV iron in past by nephrologist but declined and has taken PO instead.  . Aneurysm, cerebral   . MRSA (methicillin resistant Staphylococcus aureus)     Medications:  Infusions:  . heparin 1,200 Units/hr  (04/14/13 2052)    Assessment: 72 yom with significant cardiac history to start IV heparin for CP. Troponin and BNP are elevated. Pt is mildly anemic but plts are WNL. He is on aspirin + plavix PTA.   Heparin level (< 0.1) is below-goal on 1200 units/hr. No problem with line / infusion and no bleeding per RN.   Goal of Therapy:  Heparin level 0.3-0.7 units/ml Monitor platelets by anticoagulation protocol: Yes   Plan:  1. Heparin 3000 unit IV bolus x 1, then increase IV infusion to 1600 units/hr. 2. Heparin level in 6 hours  Emeline Gins 04/15/2013,5:45 AM

## 2013-04-15 NOTE — Progress Notes (Signed)
CRITICAL VALUE ALERT  Critical value received: Troponin 18.99  Date of notification:  04/15/2013   Time of notification:  2:49 AM  Critical value read back:yes  Nurse who received alert:  Jola Schmidt  MD notified (1st page):    Time of first page:    MD notified (2nd page):  Time of second page:  Responding MD:    Time MD responded:

## 2013-04-15 NOTE — Progress Notes (Signed)
  Echocardiogram 2D Echocardiogram has been performed.  [name] [name] 04/15/2013, 5:03 PM

## 2013-04-16 LAB — CBC
Hemoglobin: 9.7 g/dL — ABNORMAL LOW (ref 13.0–17.0)
MCHC: 31.2 g/dL (ref 30.0–36.0)
RDW: 17.3 % — ABNORMAL HIGH (ref 11.5–15.5)
WBC: 9.1 10*3/uL (ref 4.0–10.5)

## 2013-04-16 LAB — BASIC METABOLIC PANEL
GFR calc Af Amer: 68 mL/min — ABNORMAL LOW (ref >90)
GFR calc non Af Amer: 59 mL/min — ABNORMAL LOW (ref >90)
Potassium: 4.1 mEq/L (ref 3.5–5.1)
Sodium: 131 mEq/L — ABNORMAL LOW (ref 135–145)

## 2013-04-16 LAB — GLUCOSE, CAPILLARY
Glucose-Capillary: 208 mg/dL — ABNORMAL HIGH (ref 70–99)
Glucose-Capillary: 132 mg/dL — ABNORMAL HIGH (ref 70–99)

## 2013-04-16 LAB — FERRITIN: Ferritin: 10 ng/mL — ABNORMAL LOW (ref 22–322)

## 2013-04-16 LAB — IRON AND TIBC
Iron: 21 ug/dL — ABNORMAL LOW (ref 42–135)
TIBC: 284 ug/dL (ref 215–435)
UIBC: 263 ug/dL (ref 125–400)

## 2013-04-16 LAB — HEPARIN LEVEL (UNFRACTIONATED): Heparin Unfractionated: 0.32 IU/mL (ref 0.30–0.70)

## 2013-04-16 MED ORDER — ASPIRIN 81 MG PO CHEW
324.0000 mg | CHEWABLE_TABLET | ORAL | Status: AC
  Administered 2013-04-17: 324 mg via ORAL
  Filled 2013-04-16: qty 4

## 2013-04-16 MED ORDER — BIOTENE DRY MOUTH MT LIQD
15.0000 mL | Freq: Two times a day (BID) | OROMUCOSAL | Status: DC
  Administered 2013-04-18: 15 mL via OROMUCOSAL

## 2013-04-16 MED ORDER — SODIUM CHLORIDE 0.9 % IJ SOLN: 3.0000 mL | INTRAMUSCULAR | Status: DC | PRN

## 2013-04-16 MED ORDER — SODIUM CHLORIDE 0.9 % IV SOLN
1.0000 mL/kg/h | INTRAVENOUS | Status: DC
  Administered 2013-04-16 – 2013-04-17 (×2): 1 mL/kg/h via INTRAVENOUS

## 2013-04-16 MED ORDER — FERROUS SULFATE 325 (65 FE) MG PO TABS
325.0000 mg | ORAL_TABLET | Freq: Two times a day (BID) | ORAL | Status: DC
  Administered 2013-04-16 – 2013-04-18 (×4): 325 mg via ORAL
  Filled 2013-04-16 (×6): qty 1

## 2013-04-16 MED ORDER — SODIUM CHLORIDE 0.9 % IJ SOLN
3.0000 mL | Freq: Two times a day (BID) | INTRAMUSCULAR | Status: DC
  Administered 2013-04-16 – 2013-04-17 (×2): 3 mL via INTRAVENOUS

## 2013-04-16 MED ORDER — CHLORHEXIDINE GLUCONATE 0.12 % MT SOLN
15.0000 mL | Freq: Two times a day (BID) | OROMUCOSAL | Status: DC
  Administered 2013-04-17 – 2013-04-18 (×2): 15 mL via OROMUCOSAL
  Filled 2013-04-16: qty 15

## 2013-04-16 MED ORDER — SODIUM CHLORIDE 0.9 % IV SOLN: 250.0000 mL | INTRAVENOUS | Status: DC | PRN

## 2013-04-16 NOTE — Progress Notes (Signed)
ANTICOAGULATION CONSULT NOTE - Follow Up Consult  Pharmacy Consult for heparin Indication: chest pain/ACS  Allergies  Allergen Reactions  . Codeine     REACTION: blisters, but able to take hydrocodone    Patient Measurements: Height: 5\' 8"  (172.7 cm) Weight: 244 lb 11.4 oz (111 kg) IBW/kg (Calculated) : 68.4 Heparin Dosing Weight: 93.3  Vital Signs: Temp: 98.2 F (36.8 C) (06/01 0354) Temp src: Oral (06/01 0354) BP: 114/65 mmHg (06/01 0556) Pulse Rate: 60 (06/01 0354)  Labs:  Recent Labs  04/14/13 1721 04/14/13 1858 04/15/13 0150 04/15/13 0416 04/15/13 0840 04/15/13 1400 04/16/13 0518 04/16/13 0530  HGB 9.8* 10.9*  --  9.4*  --   --  9.7*  --   HCT 30.7* 32.0*  --  29.6*  --   --  31.1*  --   PLT 216  --   --  223  --   --  221  --   LABPROT 13.4  --   --   --   --   --   --   --   INR 1.03  --   --   --   --   --   --   --   HEPARINUNFRC  --   --   --  <0.10*  --  0.40  --  0.32  CREATININE 1.44* 1.50* 1.21  --   --  1.21  --   --   TROPONINI  --   --  18.99*  --  >20.00* 18.09*  --   --     Estimated Creatinine Clearance: 65.7 ml/min (by C-G formula based on Cr of 1.21).   Medical History: Past Medical History  Diagnosis Date  . Type 2 diabetes mellitus   . Coronary atherosclerosis of native coronary artery     a. DES to RCA 03/2009. b. s/p PTCA to RCA for ISR, 05/2010.  Marland Kitchen Hyperlipidemia   . TIA (transient ischemic attack)     Multiple TIAs  . Cardiomyopathy, ischemic     a. LVEF 25-35%.  . Morbid obesity   . Mitral regurgitation     Mild  . BPH (benign prostatic hypertrophy)   . LBBB (left bundle branch block)     s/p BiV ICD Implanted by Dr Graciela Husbands  . Paroxysmal atrial fibrillation     a. Coumadin discontinued in 2010 when the patient required ASA/Plavix for stenting.  . Chronic kidney disease, stage III (moderate)     a. Followed by Dr. Fausto Skillern.  Marland Kitchen LBBB (left bundle branch block)   . HTN (hypertension)   . Anemia     a. Pt reports h/o anemia  - was offered IV iron in past by nephrologist but declined and has taken PO instead.  . Aneurysm, cerebral   . MRSA (methicillin resistant Staphylococcus aureus)     Medications:  Infusions:  . heparin 1,600 Units/hr (04/16/13 0433)    Assessment: 72 yom with significant cardiac history on IV heparin for CP. Troponin and BNP are elevated. Pt is mildly anemic but plts are WNL. He is on aspirin + plavix PTA.   Heparin level (0.32) continues to be at goal on 1600 units/hr.  Goal of Therapy:  Heparin level 0.3-0.7 units/ml Monitor platelets by anticoagulation protocol: Yes   Plan:  - Continue heparin 1600 units/hr - Daily HL, CBC - Monitor for bleeding   Thank you, Franchot Erichsen, Pharm.D. Clinical Pharmacist   Pager: [phone] 04/16/2013 8:03 AM

## 2013-04-16 NOTE — Progress Notes (Signed)
Patient ID: [name], male   DOB: January 01, 1941, 72 y.o.   MRN: [id number]    SUBJECTIVE: No further chest pain, no dyspnea.    Marland Kitchen amoxicillin  500 mg Oral QID  . antiseptic oral rinse  15 mL Mouth Rinse q12n4p  . aspirin  325 mg Oral Daily  . atorvastatin  80 mg Oral q1800  . carvedilol  6.25 mg Oral BID WC  . chlorhexidine  15 mL Mouth/Throat BID  . clopidogrel  75 mg Oral Q breakfast  . felodipine  10 mg Oral Daily  . ferrous sulfate  325 mg Oral BID WC  . furosemide  20 mg Oral BID  . hydrALAZINE  25 mg Oral Q8H  . insulin aspart  0-5 Units Subcutaneous QHS  . insulin aspart  0-9 Units Subcutaneous TID WC  . insulin aspart protamine- aspart  20 Units Subcutaneous TID WC  . iron polysaccharides  150 mg Oral Daily  . l-methylfolate-B6-B12  1 tablet Oral BID  . nitroGLYCERIN  0.5 inch Topical Q6H  . pyridOXINE  50 mg Oral Daily  . sodium chloride  3 mL Intravenous Q12H  heparin gtt    Filed Vitals:   04/16/13 0354 04/16/13 0500 04/16/13 0556 04/16/13 0817  BP: 127/47  114/65 109/47  Pulse: 60   77  Temp: 98.2 F (36.8 C)   98.3 F (36.8 C)  TempSrc: Oral   Oral  Resp: 17   16  Height:      Weight:  244 lb 11.4 oz (111 kg)    SpO2: 95%   95%    Intake/Output Summary (Last 24 hours) at 04/16/13 0849 Last data filed at 04/16/13 0600  Gross per 24 hour  Intake   1072 ml  Output   1950 ml  Net   -878 ml    LABS: Basic Metabolic Panel:  Recent Labs  84/69/62 0150 04/15/13 1400  NA 135 131*  K 4.2 4.7  CL 100 96  CO2 23 24  GLUCOSE 225* 209*  BUN 22 21  CREATININE 1.21 1.21  CALCIUM 9.1 9.0   Liver Function Tests:  Recent Labs  04/15/13 0150  AST 111*  ALT 21  ALKPHOS 60  BILITOT 0.2*  PROT 6.8  ALBUMIN 3.3*   No results found for this basename: LIPASE, AMYLASE,  in the last 72 hours CBC:  Recent Labs  04/15/13 0416 04/16/13 0518  WBC 8.9 9.1  HGB 9.4* 9.7*  HCT 29.6* 31.1*  MCV 80.7 81.0  PLT 223 221   Cardiac Enzymes:  Recent  Labs  04/15/13 0150 04/15/13 0840 04/15/13 1400  TROPONINI 18.99* >20.00* 18.09*   BNP: No components found with this basename: POCBNP,  D-Dimer: No results found for this basename: DDIMER,  in the last 72 hours Hemoglobin A1C:  Recent Labs  04/15/13 0150  HGBA1C 8.5*   Fasting Lipid Panel:  Recent Labs  04/15/13 0150  CHOL 157  HDL 32*  LDLCALC 69  TRIG 952*  CHOLHDL 4.9   Thyroid Function Tests:  Recent Labs  04/15/13 0150  TSH 3.996   Anemia Panel:  Recent Labs  04/15/13 0150  VITAMINB12 >2000*  FOLATE >20.0  FERRITIN 8*  TIBC 284  IRON 21*  RETICCTPCT 1.8    RADIOLOGY: Dg Chest 2 View  04/14/2013   *RADIOLOGY REPORT*  Clinical Data: Chest pain.  History of congestive heart failure and diabetes.  CHEST - 2 VIEW  Comparison: Portable examination 05/07/2011.  Two-view examination 05/06/2011.  Findings: Left subclavian AICD appears stable with leads in the right atrium, right ventricle and coronary sinus.  The heart is mildly enlarged.  There is an ill-defined nodular density in the right perihilar region which appears more conspicuous than on the prior two-view examination.  This is not clearly seen on the lateral view.  The left lung is clear.  Some pleural thickening or extrapleural fat deposition laterally on the right is stable. There is no significant pleural effusion.  The osseous structures appear unchanged.  IMPRESSION:  1.  No acute cardiopulmonary process identified. 2.  Difficult to exclude a developing nodule in the right perihilar region.  This could be due to scarring and/or vascular structures. This could be further evaluated with CT or short-term follow-up.   Original Report Authenticated By: Carey Bullocks, M.D.    PHYSICAL EXAM General: NAD Neck: Thick, no JVD, no thyromegaly or thyroid nodule.  Lungs: Clear to auscultation bilaterally with normal respiratory effort. CV: Nondisplaced PMI.  Heart regular S1/S2, no S3/S4, no murmur.  No  peripheral edema.  No carotid bruit.  Normal pedal pulses.  Abdomen: Soft, nontender, no hepatosplenomegaly, no distention.  Neurologic: Alert and oriented x 3.  Psych: Normal affect. Extremities: No clubbing or cyanosis.   TELEMETRY: Reviewed telemetry pt in NSR, BiV paced  ASSESSMENT AND PLAN: 72 yo with history of CAD, ischemic cardiomyopathy, BiV-ICD, PAF, and CKD presented with NSTEMI.  1. CAD: NSTEMI.  TnI to >20.  No further chest pain.  Continue ASA, heparin gtt, Plavix, atorvastatin, Coreg.  LHC on Monday.   2. CKD: Creatinine 1.2 yesterday, awaiting BMET today.  Avoid nephrotoxins.  Will need to minimize contrast with cath.  3. CHF: Chronic systolic CHF.  He does not appear volume overloaded.  No ACEI with renal dysfunction.  Continue Coreg, hydralazine, and nitrates.  Awaiting echo.  Can continue current Lasix dose for now.  4. Anemia: Stable since admission.  He says he has been anemic for a long time.  Had colonoscopy about 18 mos ago, apparently unremarkable.  He is iron deficient, I will start FeSO4.  He denies melena or BRBPR.  5. PAF: Not sure when this was last documented.  Has been in NSR here.  Should have device interrogation before d/c to see if he has episodes of PAF.  Would need to consider anticoagulation if AF has been detected. However, this would require a period of Plavix + anticoagulation with increased risk of bleeding in a patient who is already anemic.   [name] 04/16/2013 8:49 AM

## 2013-04-17 ENCOUNTER — Encounter (HOSPITAL_COMMUNITY)
Admission: EM | Disposition: A | Discharge status: Home / Self Care | Payer: Self-pay | Source: Home / Self Care | Attending: Cardiology

## 2013-04-17 DIAGNOSIS — I214 Non-ST elevation (NSTEMI) myocardial infarction: Secondary | ICD-10-CM

## 2013-04-17 DIAGNOSIS — I2510 Atherosclerotic heart disease of native coronary artery without angina pectoris: Secondary | ICD-10-CM

## 2013-04-17 HISTORY — PX: LEFT HEART CATHETERIZATION WITH CORONARY ANGIOGRAM: SHX5451

## 2013-04-17 LAB — CBC
Hemoglobin: 9.8 g/dL — ABNORMAL LOW (ref 13.0–17.0)
RBC: 3.87 MIL/uL — ABNORMAL LOW (ref 4.22–5.81)
WBC: 9.1 10*3/uL (ref 4.0–10.5)

## 2013-04-17 LAB — GLUCOSE, CAPILLARY
Glucose-Capillary: 166 mg/dL — ABNORMAL HIGH (ref 70–99)
Glucose-Capillary: 150 mg/dL — ABNORMAL HIGH (ref 70–99)
Glucose-Capillary: 126 mg/dL — ABNORMAL HIGH (ref 70–99)

## 2013-04-17 LAB — HEPARIN LEVEL (UNFRACTIONATED)
Heparin Unfractionated: 0.25 IU/mL — ABNORMAL LOW (ref 0.30–0.70)
Heparin Unfractionated: 0.4 IU/mL (ref 0.30–0.70)

## 2013-04-17 LAB — BASIC METABOLIC PANEL
CO2: 25 mEq/L (ref 19–32)
Calcium: 9.1 mg/dL (ref 8.4–10.5)
Chloride: 98 mEq/L (ref 96–112)
Potassium: 5.4 mEq/L — ABNORMAL HIGH (ref 3.5–5.1)
Sodium: 131 mEq/L — ABNORMAL LOW (ref 135–145)

## 2013-04-17 SURGERY — LEFT HEART CATHETERIZATION WITH CORONARY ANGIOGRAM
Anesthesia: LOCAL

## 2013-04-17 MED ORDER — ASPIRIN 81 MG PO CHEW
81.0000 mg | CHEWABLE_TABLET | Freq: Every day | ORAL | Status: DC
  Administered 2013-04-18: 81 mg via ORAL
  Filled 2013-04-17 (×2): qty 1

## 2013-04-17 MED ORDER — FUROSEMIDE 10 MG/ML IJ SOLN
40.0000 mg | Freq: Once | INTRAMUSCULAR | Status: AC
  Administered 2013-04-17: 40 mg via INTRAVENOUS

## 2013-04-17 MED ORDER — HEPARIN (PORCINE) IN NACL 2-0.9 UNIT/ML-% IJ SOLN
INTRAMUSCULAR | Status: AC
  Filled 2013-04-17: qty 1000

## 2013-04-17 MED ORDER — LIDOCAINE HCL (PF) 1 % IJ SOLN
INTRAMUSCULAR | Status: AC
  Filled 2013-04-17: qty 30

## 2013-04-17 MED ORDER — MIDAZOLAM HCL 2 MG/2ML IJ SOLN
INTRAMUSCULAR | Status: AC
  Filled 2013-04-17: qty 2

## 2013-04-17 MED ORDER — BIVALIRUDIN 250 MG IV SOLR
INTRAVENOUS | Status: AC
  Filled 2013-04-17: qty 250

## 2013-04-17 MED ORDER — SODIUM CHLORIDE 0.9 % IV SOLN: INTRAVENOUS | Status: AC

## 2013-04-17 MED ORDER — FENTANYL CITRATE 0.05 MG/ML IJ SOLN
INTRAMUSCULAR | Status: AC
  Filled 2013-04-17: qty 2

## 2013-04-17 MED ORDER — SODIUM CHLORIDE 0.9 % IV SOLN
1.7500 mg/kg/h | INTRAVENOUS | Status: DC
  Filled 2013-04-17: qty 250

## 2013-04-17 NOTE — Interval H&P Note (Signed)
History and Physical Interval Note:  04/17/2013 1:54 PM  [name]  has presented today for cardiac cath with the diagnosis of NSTEMI, known CAD.   The various methods of treatment have been discussed with the patient and family. After consideration of risks, benefits and other options for treatment, the patient has consented to  Procedure(s): LEFT HEART CATHETERIZATION WITH CORONARY ANGIOGRAM (N/A) as a surgical intervention .  The patient's history has been reviewed, patient examined, no change in status, stable for surgery.  I have reviewed the patient's chart and labs.  Questions were answered to the patient's satisfaction.     Hyde Sires

## 2013-04-17 NOTE — CV Procedure (Signed)
Cardiac Catheterization Operative Report  [name] [id number] 6/2/20143:35 PM [facility], MD  Procedure Performed:  1. Left Heart Catheterization 2. Selective Coronary Angiography 3. Left ventricular pressures 4. PTCA/DES x 1 proximal RCA 5. Perclose closure device RFA  Operator: Verne Carrow, MD  Indication:   72 yo male with history of CAD with previous stenting of the RCA, DM, CKD, ischemic CM admitted with NSTEMI.                                    Procedure Details: The risks, benefits, complications, treatment options, and expected outcomes were discussed with the patient. The patient and/or family concurred with the proposed plan, giving informed consent. The patient was brought to the cath lab after IV hydration was begun and oral premedication was given. The patient was further sedated with Versed and Fentanyl. The right groin was prepped and draped in the usual manner. Using the modified Seldinger access technique, a 5 French sheath was placed in the right femoral artery. Standard diagnostic catheters were used to perform selective coronary angiography. A pigtail catheter was used to measure LV pressures. He was found to have severe stenosis in the proximal RCA. We elected to proceed to PCI.   PCI Note: The sheath was upsized to a 6 [place] system. He was given a bolus of Angiomax and a drip was started. He has been on Plavix chronically. I engaged the RCA with a JR4 guiding catheter. When the ACT was greater than 200, I began attempting to cross the heavily calcified, 99% proximal stenosis. This patients RCA has been worked on before requiring rotablator atherectomy in 2010 with stent placement followed by stent restenosis 2011 with difficulty passing balloons beyond the proximal vessel. My initial attempts to cross the stenosis with a BMW wire and Whisper wire were unsuccessful. I was able to cross the stenosis with a Fielder XT wire using an OTW balloon for support.  I was unable to pass a balloon beyond the stenosis. I then used a Gu[place] guide extension catheter to gain better access and support but I still could not cross the tight stenosis. A second Fielder XT wire was used to cross the stenosis. I was able to then cross the lesion with a 12.5 x 12 mm balloon. This balloon was inflated twice. I then carefully positioned and deployed a 3.5 x 12 mm Promus Premier DES. The stent was post-dilated with a 3.75 x 8 mm [place] balloon x 1. The stenosis was taken from 99% down to 0%. The guide and wire were removed. Perclose right femoral artery.   There were no immediate complications. The patient was taken to the recovery area in stable condition.   Hemodynamic Findings: Central aortic pressure: 116/54 Left ventricular pressure: 122/14/20  Angiographic Findings:  Left main: 20% stenosis.   Left Anterior Descending Artery: Large vessel that courses to the apex and gives off several diagonal branches. The first diagonal is moderate in caliber with diffuse mild plaque. The proximal LAD has a focal 70% stenosis. This is grossly unchanged from the last cath. The mid and distal vessel has diffuse 40-50% stenosis. The distal vessel becomes small in caliber.   Circumflex Artery: Large caliber vessel with moderate caliber, bifurcating intermediate branch. The intermediate branch has diffuse mid 50% stenosis. The AV groove Circumflex has mild plaque. The first OM branch has diffuse 40% stenosis.   Right Coronary Artery: Large dominant  vessel heavy proximal and mid calcification with patent mid stents. The proximal vessel has a 99% stenosis before the stented segment. The mid stented segment has 50% restenosis. The distal vessel has diffuse 50% stenosis. The PDA has a 70% distal stenosis, unchanged from last cath.   Left Ventricular Angiogram: Deferred.   Impression: 1. Triple vessel CAD 2. NSTEMI secondary to severe stenosis proximal RCA 3. Successful PTCA/DES x 1  proximal RCA  Recommendations: He will need to remain on ASA and Plavix for lifetime. Continue other meds. Continue aggressive risk factor reduction.        Complications:  None. The patient tolerated the procedure well.

## 2013-04-17 NOTE — Progress Notes (Signed)
    SUBJECTIVE: No chest pain this am.   BP 120/37  Pulse 70  Temp(Src) 98.6 F (37 C) (Oral)  Resp 15  Ht 5\' 8"  (1.727 m)  Wt 246 lb 11.2 oz (111.902 kg)  BMI 37.52 kg/m2  SpO2 95%  Intake/Output Summary (Last 24 hours) at 04/17/13 0915 Last data filed at 04/17/13 0800  Gross per 24 hour  Intake   1937 ml  Output   2300 ml  Net   -363 ml    PHYSICAL EXAM General: Well developed, well nourished, in no acute distress. Alert and oriented x 3.  Psych:  Good affect, responds appropriately Neck: No JVD. No masses noted.  Lungs: Clear bilaterally with no wheezes or rhonci noted.  Heart: RRR with no murmurs noted. Abdomen: Bowel sounds are present. Soft, non-tender.  Extremities: No lower extremity edema.   LABS: Basic Metabolic Panel:  Recent Labs  16/10/96 0950 04/17/13 0425  NA 131* 131*  K 4.1 5.4*  CL 97 98  CO2 25 25  GLUCOSE 267* 158*  BUN 20 21  CREATININE 1.19 1.19  CALCIUM 8.8 9.1   CBC:  Recent Labs  04/16/13 0518 04/17/13 0425  WBC 9.1 9.1  HGB 9.7* 9.8*  HCT 31.1* 31.3*  MCV 81.0 80.9  PLT 221 241   Cardiac Enzymes:  Recent Labs  04/15/13 0150 04/15/13 0840 04/15/13 1400  TROPONINI 18.99* >20.00* 18.09*   Fasting Lipid Panel:  Recent Labs  04/15/13 0150  CHOL 157  HDL 32*  LDLCALC 69  TRIG 045*  CHOLHDL 4.9    Current Meds: . amoxicillin  500 mg Oral QID  . antiseptic oral rinse  15 mL Mouth Rinse q12n4p  . aspirin  325 mg Oral Daily  . atorvastatin  80 mg Oral q1800  . carvedilol  6.25 mg Oral BID WC  . chlorhexidine  15 mL Mouth/Throat BID  . clopidogrel  75 mg Oral Q breakfast  . felodipine  10 mg Oral Daily  . ferrous sulfate  325 mg Oral BID WC  . furosemide  20 mg Oral BID  . hydrALAZINE  25 mg Oral Q8H  . insulin aspart  0-5 Units Subcutaneous QHS  . insulin aspart  0-9 Units Subcutaneous TID WC  . insulin aspart protamine- aspart  20 Units Subcutaneous TID WC  . iron polysaccharides  150 mg Oral Daily  .  l-methylfolate-B6-B12  1 tablet Oral BID  . nitroGLYCERIN  0.5 inch Topical Q6H  . pyridOXINE  50 mg Oral Daily  . sodium chloride  3 mL Intravenous Q12H  . sodium chloride  3 mL Intravenous Q12H     ASSESSMENT AND PLAN: 72 yo with history of CAD, ischemic cardiomyopathy, BiV-ICD, PAF, and CKD presented with NSTEMI.   1. CAD: NSTEMI. TnI to >20. No further chest pain. Continue ASA, heparin gtt, Plavix, atorvastatin, Coreg. LHC today.  2. CKD: Creatinine 1.2 today. Avoid nephrotoxins. Will need to minimize contrast with cath.   3. CHF: Chronic systolic CHF. He does not appear volume overloaded. No ACEI with renal dysfunction. Continue Coreg, hydralazine, and nitrates. Can continue current Lasix dose for now.   4. Anemia: Stable since admission. Had colonoscopy about 18 mos ago, apparently unremarkable. He is iron deficient, Continue FeSO4. He denies melena or BRBPR.   5. PAF: Not sure when this was last documented. Has been in NSR here.        [name],[name]  6/2/20149:15 AM

## 2013-04-17 NOTE — H&P (View-Only) (Signed)
    SUBJECTIVE: No chest pain this am.   BP 120/37  Pulse 70  Temp(Src) 98.6 F (37 C) (Oral)  Resp 15  Ht 5' 8" (1.727 m)  Wt 246 lb 11.2 oz (111.902 kg)  BMI 37.52 kg/m2  SpO2 95%  Intake/Output Summary (Last 24 hours) at 04/17/13 0915 Last data filed at 04/17/13 0800  Gross per 24 hour  Intake   1937 ml  Output   2300 ml  Net   -363 ml    PHYSICAL EXAM General: Well developed, well nourished, in no acute distress. Alert and oriented x 3.  Psych:  Good affect, responds appropriately Neck: No JVD. No masses noted.  Lungs: Clear bilaterally with no wheezes or rhonci noted.  Heart: RRR with no murmurs noted. Abdomen: Bowel sounds are present. Soft, non-tender.  Extremities: No lower extremity edema.   LABS: Basic Metabolic Panel:  Recent Labs  04/16/13 0950 04/17/13 0425  NA 131* 131*  K 4.1 5.4*  CL 97 98  CO2 25 25  GLUCOSE 267* 158*  BUN 20 21  CREATININE 1.19 1.19  CALCIUM 8.8 9.1   CBC:  Recent Labs  04/16/13 0518 04/17/13 0425  WBC 9.1 9.1  HGB 9.7* 9.8*  HCT 31.1* 31.3*  MCV 81.0 80.9  PLT 221 241   Cardiac Enzymes:  Recent Labs  04/15/13 0150 04/15/13 0840 04/15/13 1400  TROPONINI 18.99* >20.00* 18.09*   Fasting Lipid Panel:  Recent Labs  04/15/13 0150  CHOL 157  HDL 32*  LDLCALC 69  TRIG 282*  CHOLHDL 4.9    Current Meds: . amoxicillin  500 mg Oral QID  . antiseptic oral rinse  15 mL Mouth Rinse q12n4p  . aspirin  325 mg Oral Daily  . atorvastatin  80 mg Oral q1800  . carvedilol  6.25 mg Oral BID WC  . chlorhexidine  15 mL Mouth/Throat BID  . clopidogrel  75 mg Oral Q breakfast  . felodipine  10 mg Oral Daily  . ferrous sulfate  325 mg Oral BID WC  . furosemide  20 mg Oral BID  . hydrALAZINE  25 mg Oral Q8H  . insulin aspart  0-5 Units Subcutaneous QHS  . insulin aspart  0-9 Units Subcutaneous TID WC  . insulin aspart protamine- aspart  20 Units Subcutaneous TID WC  . iron polysaccharides  150 mg Oral Daily  .  l-methylfolate-B6-B12  1 tablet Oral BID  . nitroGLYCERIN  0.5 inch Topical Q6H  . pyridOXINE  50 mg Oral Daily  . sodium chloride  3 mL Intravenous Q12H  . sodium chloride  3 mL Intravenous Q12H     ASSESSMENT AND PLAN: 72 yo with history of CAD, ischemic cardiomyopathy, BiV-ICD, PAF, and CKD presented with NSTEMI.   1. CAD: NSTEMI. TnI to >20. No further chest pain. Continue ASA, heparin gtt, Plavix, atorvastatin, Coreg. LHC today.  2. CKD: Creatinine 1.2 today. Avoid nephrotoxins. Will need to minimize contrast with cath.   3. CHF: Chronic systolic CHF. He does not appear volume overloaded. No ACEI with renal dysfunction. Continue Coreg, hydralazine, and nitrates. Can continue current Lasix dose for now.   4. Anemia: Stable since admission. Had colonoscopy about 18 mos ago, apparently unremarkable. He is iron deficient, Continue FeSO4. He denies melena or BRBPR.   5. PAF: Not sure when this was last documented. Has been in NSR here.        MCALHANY,CHRISTOPHER  6/2/20149:15 AM  

## 2013-04-17 NOTE — Progress Notes (Signed)
ANTICOAGULATION CONSULT NOTE - Follow Up Consult  Pharmacy Consult for heparin Indication: chest pain/ACS  Allergies  Allergen Reactions  . Codeine     REACTION: blisters, but able to take hydrocodone    Patient Measurements: Height: 5\' 8"  (172.7 cm) Weight: 246 lb 11.2 oz (111.902 kg) IBW/kg (Calculated) : 68.4 Heparin Dosing Weight: 93.3  Vital Signs: Temp: 98.1 F (36.7 C) (06/02 0413) Temp src: Oral (06/02 0413) BP: 137/39 mmHg (06/02 0413) Pulse Rate: 70 (06/01 2200)  Labs:  Recent Labs  04/14/13 1721  04/15/13 0150  04/15/13 0416 04/15/13 0840 04/15/13 1400 04/16/13 0518 04/16/13 0530 04/16/13 0950 04/17/13 0425  HGB 9.8*  < >  --   --  9.4*  --   --  9.7*  --   --  9.8*  HCT 30.7*  < >  --   --  29.6*  --   --  31.1*  --   --  31.3*  PLT 216  --   --   --  223  --   --  221  --   --  241  LABPROT 13.4  --   --   --   --   --   --   --   --   --   --   INR 1.03  --   --   --   --   --   --   --   --   --   --   HEPARINUNFRC  --   --   --   < > <0.10*  --  0.40  --  0.32  --  0.25*  CREATININE 1.44*  < > 1.21  --   --   --  1.21  --   --  1.19 1.19  TROPONINI  --   --  18.99*  --   --  >20.00* 18.09*  --   --   --   --   < > = values in this interval not displayed.  Estimated Creatinine Clearance: 67.1 ml/min (by C-G formula based on Cr of 1.19).   Medical History: Past Medical History  Diagnosis Date  . Type 2 diabetes mellitus   . Coronary atherosclerosis of native coronary artery     a. DES to RCA 03/2009. b. s/p PTCA to RCA for ISR, 05/2010.  Marland Kitchen Hyperlipidemia   . TIA (transient ischemic attack)     Multiple TIAs  . Cardiomyopathy, ischemic     a. LVEF 25-35%.  . Morbid obesity   . Mitral regurgitation     Mild  . BPH (benign prostatic hypertrophy)   . LBBB (left bundle branch block)     s/p BiV ICD Implanted by Dr Graciela Husbands  . Paroxysmal atrial fibrillation     a. Coumadin discontinued in 2010 when the patient required ASA/Plavix for stenting.   . Chronic kidney disease, stage III (moderate)     a. Followed by Dr. Fausto Skillern.  Marland Kitchen LBBB (left bundle branch block)   . HTN (hypertension)   . Anemia     a. Pt reports h/o anemia - was offered IV iron in past by nephrologist but declined and has taken PO instead.  . Aneurysm, cerebral   . MRSA (methicillin resistant Staphylococcus aureus)     Medications:  Infusions:  . sodium chloride 1 mL/kg/hr (04/17/13 0520)  . heparin 1,600 Units/hr (04/16/13 2338)    Assessment: 72 yom with significant cardiac history on IV heparin for CP. Troponin  and BNP are elevated. Pt is mildly anemic but plts are WNL. He is on aspirin + plavix PTA.   Heparin level (0.25) is below-goal on 1600 units/hr. No problem with line / infusion and no bleeding per RN.   Goal of Therapy:  Heparin level 0.3-0.7 units/ml Monitor platelets by anticoagulation protocol: Yes   Plan: . 1. Increase IV heparin to 1800 units/hr.  2. Heparin level in 6 hours vs follow-up post-cath.   Lorre Munroe, PharmD  04/17/2013 6:57 AM

## 2013-04-17 NOTE — Progress Notes (Addendum)
ANTICOAGULATION CONSULT NOTE - Follow Up Consult  Pharmacy Consult for Heparin Indication: chest pain/ACS  Allergies  Allergen Reactions  . Codeine     REACTION: blisters, but able to take hydrocodone    Patient Measurements: Height: 5\' 8"  (172.7 cm) Weight: 246 lb 11.2 oz (111.902 kg) IBW/kg (Calculated) : 68.4 Heparin Dosing Weight:    Vital Signs: Temp: 98.6 F (37 C) (06/02 0800) Temp src: Oral (06/02 0800) BP: 120/37 mmHg (06/02 0800) Pulse Rate: 60 (06/02 1100)  Labs:  Recent Labs  04/14/13 1721  04/15/13 0150  04/15/13 0416 04/15/13 0840 04/15/13 1400 04/16/13 0518 04/16/13 0530 04/16/13 0950 04/17/13 0425 04/17/13 1203  HGB 9.8*  < >  --   --  9.4*  --   --  9.7*  --   --  9.8*  --   HCT 30.7*  < >  --   --  29.6*  --   --  31.1*  --   --  31.3*  --   PLT 216  --   --   --  223  --   --  221  --   --  241  --   LABPROT 13.4  --   --   --   --   --   --   --   --   --   --   --   INR 1.03  --   --   --   --   --   --   --   --   --   --   --   HEPARINUNFRC  --   --   --   < > <0.10*  --  0.40  --  0.32  --  0.25* 0.40  CREATININE 1.44*  < > 1.21  --   --   --  1.21  --   --  1.19 1.19  --   TROPONINI  --   --  18.99*  --   --  >20.00* 18.09*  --   --   --   --   --   < > = values in this interval not displayed.  Estimated Creatinine Clearance: 67.1 ml/min (by C-G formula based on Cr of 1.19).   Assessment: CP  AC: Heparin for CP/ACS and hx of Afib - INR 1.03, H/H 10.9/32, plts 216 - ASA + plavix PTA. HL therapeutic (0.4) on 1600 units/hr.   ID: On amoxil pta? WBC 9.1. Afebrile.  CV: CAD, DLD, CHF< BiV-ICD, PAF. 120/37. No ACE with renal dysfunction per MD note 6/2. Meds: asa, atorva, coreg, plavix, felodipine, lasix, hydralazine.   Endo:DM. On SSI, novolog 70/30. CBG 132-208. A1C 8.5  GI/Nutr: B12 >2000 Meds: FESO$, Niferex, Metanx, B6  Neuro:  Renal: Hx CKD, BPH. Scr 1.21 (stable), Na slightly low, other lytes wnl.   Pulm:  Heme/Onc: HX  anemia. Tsat <20%. On Niferex. Hgb/plts stable. Known h/o anemia.  Other: linagliptin   Best practices: heparin    Goal of Therapy:  Heparin level 0.3-0.7 units/ml Monitor platelets by anticoagulation protocol: Yes   Plan:  - Continue Heparin gtt 1800 units/hr - Daily HL and CBC - F/U indication for abx - Cath today   Pasty Spillers 04/17/2013,1:34 PM

## 2013-04-18 ENCOUNTER — Telehealth: Payer: Self-pay | Admitting: Cardiology

## 2013-04-18 ENCOUNTER — Encounter (HOSPITAL_COMMUNITY): Payer: Self-pay | Admitting: Physician Assistant

## 2013-04-18 DIAGNOSIS — I10 Essential (primary) hypertension: Secondary | ICD-10-CM

## 2013-04-18 LAB — BASIC METABOLIC PANEL
BUN: 20 mg/dL (ref 6–23)
Calcium: 8.8 mg/dL (ref 8.4–10.5)
Creatinine, Ser: 1.27 mg/dL (ref 0.50–1.35)
GFR calc Af Amer: 63 mL/min — ABNORMAL LOW (ref >90)
GFR calc non Af Amer: 54 mL/min — ABNORMAL LOW (ref >90)

## 2013-04-18 LAB — CBC
HCT: 29.4 % — ABNORMAL LOW (ref 39.0–52.0)
MCH: 25.4 pg — ABNORMAL LOW (ref 26.0–34.0)
MCHC: 31.3 g/dL (ref 30.0–36.0)
MCV: 81.2 fL (ref 78.0–100.0)
RDW: 17.6 % — ABNORMAL HIGH (ref 11.5–15.5)

## 2013-04-18 MED ORDER — FERROUS SULFATE 325 (65 FE) MG PO TABS: 325.0000 mg | ORAL_TABLET | Freq: Two times a day (BID) | ORAL | Status: DC

## 2013-04-18 MED ORDER — FELODIPINE ER 10 MG PO TB24: ORAL_TABLET | ORAL | Status: DC

## 2013-04-18 MED ORDER — HYDRALAZINE HCL 25 MG PO TABS: 25.0000 mg | ORAL_TABLET | Freq: Three times a day (TID) | ORAL | Status: DC

## 2013-04-18 MED ORDER — CLOPIDOGREL BISULFATE 75 MG PO TABS: 75.0000 mg | ORAL_TABLET | Freq: Every day | ORAL | Status: DC

## 2013-04-18 MED ORDER — ISOSORBIDE MONONITRATE ER 30 MG PO TB24: 30.0000 mg | ORAL_TABLET | Freq: Every day | ORAL | Status: DC

## 2013-04-18 MED ORDER — ATORVASTATIN CALCIUM 80 MG PO TABS: 80.0000 mg | ORAL_TABLET | Freq: Every day | ORAL | Status: DC

## 2013-04-18 MED ORDER — AMOXICILLIN 500 MG PO CAPS: 500.0000 mg | ORAL_CAPSULE | Freq: Four times a day (QID) | ORAL | Status: DC

## 2013-04-18 MED ORDER — ASPIRIN 81 MG PO TABS: 81.0000 mg | ORAL_TABLET | Freq: Every day | ORAL | Status: DC

## 2013-04-18 MED FILL — Sodium Chloride IV Soln 0.9%: INTRAVENOUS | Qty: 50 | Status: AC

## 2013-04-18 NOTE — ED Provider Notes (Signed)
I saw and evaluated the patient, reviewed the resident's note and I agree with the findings and plan.   .Face to face Exam:  General:  Awake HEENT:  Atraumatic Resp:  Normal effort Abd:  Nondistended Neuro:No focal weakness   Nelia Shi, MD 04/18/13 2003

## 2013-04-18 NOTE — Telephone Encounter (Signed)
Patient is transcare please contact with in 48 hours

## 2013-04-18 NOTE — Discharge Summary (Signed)
Discharge Summary   Patient ID: [name] MRN: [id number], DOB/AGE: [birth year] 72 y.o. Admit date: 04/14/2013 D/C date:     04/18/2013  Primary Cardiologist: Diona Browner  Primary Discharge Diagnoses:  1. CAD/NSTEMI troponin >20 - severe prox RCA stenosis s/p PTCA/DES 04/17/13, med rx for residual triple vessel CAD - history: DES to RCA 03/2009, s/p PTCA to RCA for ISR 05/2010 - staretd on statin this admission, consider OP f/u labs 2. Chronic systolic CHF/ischemic cardiomyopathy  - EF 35-40% by echo this admission, h/o BiV-ICD  - euvolemic this admission 3. Chronic kidney disease stage III, followed by Dr. Fausto Skillern 4. Chronic anemia - ? secondary to #3, iron deficient, oral iron increased this admission 5. HTN  6. Paroxysmal atrial fibrillation, quiescent 7. History of remote TIAs 8. Diabetes mellitus, uncontrolled A1C 8.5 9. Possible pulm nodule on CXR - f/u PCP 10. Recent root canal  Secondary Discharge Diagnoses:  1. HLD 2. Morbid obesity 3. BPH 4. LBBB 5. Aneurysm, cerebral 6. MRSA  Hospital Course: [name] is a 72 y/o M with history of CAD s/p prior stenting as below, DM, ICM EF 30-35% in April 2012 s/p BiV-ICD, CKD, morbid obesity, TIAs, and PAF who presented to [facility] with chest pain and was found to have initial troponin of 3.48. In 2010, his Coumadin was discontinued in lieu of starting ASA/Plavix after he required DES to RCA. Last cath was in July 2011 with PTCA to RCA for ISR. He is followed by Dr.Befakadu for CKD and does note that in the past he was asked to take IV Iron transfusions for anemia but instead elected oral iron instead. Last colonoscopy was 18 months ago and normal per patient, and he has not had any evidence of bleeding. He has had intermittent chest pain for the past week, but the day of admission around 12pm it became progressively more severe, particularly with exertion. He took SL NTG with resolution of pain then came to the ER where  troponin was elevated and EKG was v-paced. He does endorse weight gain of 30 lbs over the past year and about 5lbs in the past week with pBNP 1000 and CXR nonacute but ? of possible pulm nodule. On exam he was euvolemic. He is not on ACEI due to renal dysfunction. Labs on admission showed BUN/Cr 26/1.44, elevated glucose, Hgb 9.8 normocytic stable from prior values. He was started on heparin with plan for cath following the weekend. 2D echo was obtained showing EF 35-40%, mod LVH, gr 1 d/d, mildly dilated LA. Peak troponin went to >20. Nephrotoxins were avoided in prep for cath, which he underwent 04/17/13. This demonstrated triple vessel CAD with severe stenosis of the prox RCA which was treated with PTCA/DES. He has stable moderate disease in the LAD and ramus intermediate branch that will be treated medically. Dr Clifton James states he will need to remain on ASA and Plavix for lifetime. He was started on Imdur, and hydralazine was increased to TID dosing. The patient did well post-procedurally. He has been ambulating all morning without adverse event. Dr. Clifton James has seen and examined him today and feels he is stable for discharge. Per office protocol, will schedule a 1 week TOC appt given NSTEMI.  Regarding anemia, labs confirmed iron deficiency and he was changed to FeSo4, and also showed elevated B12 level. He was instructed to discuss with PCP and nephrologist. He was also told to f/u PCP for diabetes since A1C was 8.5. His CXR also showed question  of a developing nodule in the R perihilar region thus he was instructed to discuss f/u with PCP (CXR states: This could be further evaluated with CT or short-term follow-up.) He is to continue abx through 04/19/13 as previously planned for root canal. I personally discussed these things with the patient.  With regard to history of PAF, his device was interrogated today by [employer] and he has had no evidence of this. It is unclear when this was last documented but the  patient states he last had it 10-12 years ago. Would need to consider anticoagulation in the future if AF is detected. However, this would require a period of Plavix + anticoagulation with increased risk of bleeding in a patient who is already anemic.   Discharge Vitals: Blood pressure 150/44, pulse 64, temperature 98.5 F (36.9 C), temperature source Oral, resp. rate 18, height 5\' 8"  (1.727 m), weight 247 lb (112.038 kg), SpO2 94.00%.  Labs: Lab Results  Component Value Date   WBC 8.4 04/18/2013   HGB 9.2* 04/18/2013   HCT 29.4* 04/18/2013   MCV 81.2 04/18/2013   PLT 226 04/18/2013    Recent Labs Lab 04/15/13 0150  04/18/13 0428  NA 135  < > 133*  K 4.2  < > 4.4  CL 100  < > 98  CO2 23  < > 27  BUN 22  < > 20  CREATININE 1.21  < > 1.27  CALCIUM 9.1  < > 8.8  PROT 6.8  --   --   BILITOT 0.2*  --   --   ALKPHOS 60  --   --   ALT 21  --   --   AST 111*  --   --   GLUCOSE 225*  < > 147*  < > = values in this interval not displayed. No results found for this basename: CKTOTAL, CKMB, TROPONINI,  in the last 72 hours Lab Results  Component Value Date   CHOL 157 04/15/2013   HDL 32* 04/15/2013   LDLCALC 69 04/15/2013   TRIG 282* 04/15/2013    Diagnostic Studies/Procedures    Cardiac catheterization this admission, please see full report and above for summary.  2D Echo 04/15/13 Study Conclusions - Left ventricle: The cavity size was normal. Wall thickness was increased in a pattern of moderate LVH. Systolic function was moderately reduced. The estimated ejection fraction was in the range of 35% to 40%. There was an increased relative contribution of atrial contraction to ventricular filling. Doppler parameters are consistent with abnormal left ventricular relaxation (grade 1 diastolic dysfunction). - Left atrium: The atrium was mildly dilated.   Dg Chest 2 View 04/14/2013   *RADIOLOGY REPORT*  Clinical Data: Chest pain.  History of congestive heart failure and diabetes.  CHEST - 2  VIEW  Comparison: Portable examination 05/07/2011.  Two-view examination 05/06/2011.  Findings: Left subclavian AICD appears stable with leads in the right atrium, right ventricle and coronary sinus.  The heart is mildly enlarged.  There is an ill-defined nodular density in the right perihilar region which appears more conspicuous than on the prior two-view examination.  This is not clearly seen on the lateral view.  The left lung is clear.  Some pleural thickening or extrapleural fat deposition laterally on the right is stable. There is no significant pleural effusion.  The osseous structures appear unchanged.  IMPRESSION:  1.  No acute cardiopulmonary process identified. 2.  Difficult to exclude a developing nodule in the right perihilar region.  This could be due to scarring and/or vascular structures. This could be further evaluated with CT or short-term follow-up.   Original Report Authenticated By: Carey Bullocks, M.D.    Discharge Medications     Medication List    STOP taking these medications       iron polysaccharides 150 MG capsule  Commonly known as:  NIFEREX      TAKE these medications       acetaminophen 650 MG CR tablet  Commonly known as:  TYLENOL  Take 2 by mouth twice daily     amoxicillin 500 MG capsule  Commonly known as:  AMOXIL  Take 1 capsule (500 mg total) by mouth 4 (four) times daily. Take two capsules by mouth NOW, then take one capsule by mouth 4 times daily until gone. Started medication 04-13-13, continue through 04-19-13 then stop.     aspirin 81 MG tablet  Take 1 tablet (81 mg total) by mouth daily.     atorvastatin 80 MG tablet  Commonly known as:  LIPITOR  Take 1 tablet (80 mg total) by mouth daily.     carvedilol 6.25 MG tablet  Commonly known as:  COREG  TAKE ONE TABLET BY MOUTH TWICE DAILY WITH MEALS     clopidogrel 75 MG tablet  Commonly known as:  PLAVIX  Take 1 tablet (75 mg total) by mouth daily.     felodipine 10 MG 24 hr tablet  Commonly  known as:  PLENDIL  TAKE ONE TABLET BY MOUTH DAILY     ferrous sulfate 325 (65 FE) MG tablet  Take 1 tablet (325 mg total) by mouth 2 (two) times daily with a meal.     furosemide 20 MG tablet  Commonly known as:  LASIX  Take 20 mg by mouth 2 (two) times daily.     hydrALAZINE 25 MG tablet  Commonly known as:  APRESOLINE  Take 1 tablet (25 mg total) by mouth 3 (three) times daily.     insulin lispro protamine-lispro (75-25) 100 UNIT/ML Susp  Commonly known as:  HUMALOG 75/25  Inject 20 Units into the skin. After every meal     isosorbide mononitrate 30 MG 24 hr tablet  Commonly known as:  IMDUR  Take 1 tablet (30 mg total) by mouth daily.     linagliptin 5 MG Tabs tablet  Commonly known as:  TRADJENTA  Take 5 mg by mouth daily.     NEURPATH-B 3-35-2 MG Tabs  Take one by mouth twice daily     l-methylfolate-B6-B12 3-35-2 MG Tabs  Commonly known as:  METANX  Take 1 tablet by mouth every other day.     nitroGLYCERIN 0.4 MG SL tablet  Commonly known as:  NITROSTAT  Place 0.4 mg under the tongue every 5 (five) minutes as needed for chest pain.     pyridOXINE 50 MG tablet  Commonly known as:  B-6  Take 50 mg by mouth daily.     Zinc 50 MG Caps  Take by mouth daily.        Disposition   The patient will be discharged in stable condition to home. Discharge Orders   Future Appointments Provider Department Dept Phone   04/26/2013 2:20 PM Prescott Parma, PA-C [facility] [place] (near [place]) [phone]   05/18/2013 1:40 PM Jonelle Sidle, MD [name] (near [place]) [phone]   Future Orders Complete By Expires     Diet - low sodium heart healthy  As directed  Discharge instructions  As directed     Comments:      Your diabetes appears to be poorly controlled (A1C 8.5) - please discuss further adjustment with your primary care doctor. Your chest x-ray showed a possible developing pulmonary nodule in the "right perihilar region" - please discuss  with your primary doctor if any further workup is necessary. You may need repeat x-ray or CT. Your labwork also showed iron deficiency and your B12 level was elevated - call your kidney doctor to discuss further plan for monitoring and treatment. Your iron dose was changed this admission.  For patients with congestive heart failure, we give them these special instructions:  1. Follow a low-salt diet and watch your fluid intake. In general, you should not be taking in more than 2 liters of fluid per day (no more than 8 glasses per day). Some patients are restricted to less than 1.5 liters of fluid per day (no more than 6 glasses per day). This includes sources of water in foods like soup, coffee, tea, milk, etc. 2. Weigh yourself on the same scale at same time of day and keep a log. 3. Call your doctor: (Anytime you feel any of the following symptoms)  - 3-4 pound weight gain in 1-2 days or 2 pounds overnight  - Shortness of breath, with or without a dry hacking cough  - Swelling in the hands, feet or stomach  - If you have to sleep on extra pillows at night in order to breathe   IT IS IMPORTANT TO LET YOUR DOCTOR KNOW EARLY ON IF YOU ARE HAVING SYMPTOMS SO WE CAN HELP YOU!    Increase activity slowly  As directed     Comments:      No driving for 1 week. No lifting over 10 lbs for 2 weeks. No sexual activity for 2 weeks. Keep procedure site clean & dry. If you notice increased pain, swelling, bleeding or pus, call/return!  You may shower, but no soaking baths/hot tubs/pools for 1 week.      Follow-up Information   Follow up with SERPE, EUGENE, PA-C. (04/26/13 at 2:20pm)    Contact information:   [address] [place] [address] [phone] [facility] - [place]        Duration of Discharge Encounter: Greater than 30 minutes including physician and PA time.  Signed, Priti Consoli PA-C 04/18/2013, 2:01 PM

## 2013-04-18 NOTE — Progress Notes (Signed)
    SUBJECTIVE: No chest pain. Feels well this am.   BP 153/49  Pulse 72  Temp(Src) 98.5 F (36.9 C) (Oral)  Resp 18  Ht 5\' 8"  (1.727 m)  Wt 247 lb (112.038 kg)  BMI 37.56 kg/m2  SpO2 95%  Intake/Output Summary (Last 24 hours) at 04/18/13 0644 Last data filed at 04/18/13 0400  Gross per 24 hour  Intake   1084 ml  Output   1950 ml  Net   -866 ml    PHYSICAL EXAM General: Well developed, well nourished, in no acute distress. Alert and oriented x 3.  Psych:  Good affect, responds appropriately Neck: No JVD. No masses noted.  Lungs: Clear bilaterally with no wheezes or rhonci noted.  Heart: RRR with no murmurs noted. Abdomen: Bowel sounds are present. Soft, non-tender.  Extremities: No lower extremity edema.   LABS: Basic Metabolic Panel:  Recent Labs  62/13/08 0425 04/18/13 0428  NA 131* 133*  K 5.4* 4.4  CL 98 98  CO2 25 27  GLUCOSE 158* 147*  BUN 21 20  CREATININE 1.19 1.27  CALCIUM 9.1 8.8   CBC:  Recent Labs  04/17/13 0425 04/18/13 0428  WBC 9.1 8.4  HGB 9.8* 9.2*  HCT 31.3* 29.4*  MCV 80.9 81.2  PLT 241 226   Cardiac Enzymes:  Recent Labs  04/15/13 0840 04/15/13 1400  TROPONINI >20.00* 18.09*   Current Meds: . amoxicillin  500 mg Oral QID  . antiseptic oral rinse  15 mL Mouth Rinse q12n4p  . aspirin  81 mg Oral Daily  . atorvastatin  80 mg Oral q1800  . carvedilol  6.25 mg Oral BID WC  . chlorhexidine  15 mL Mouth/Throat BID  . clopidogrel  75 mg Oral Q breakfast  . felodipine  10 mg Oral Daily  . ferrous sulfate  325 mg Oral BID WC  . furosemide  20 mg Oral BID  . hydrALAZINE  25 mg Oral Q8H  . insulin aspart  0-5 Units Subcutaneous QHS  . insulin aspart  0-9 Units Subcutaneous TID WC  . insulin aspart protamine- aspart  20 Units Subcutaneous TID WC  . iron polysaccharides  150 mg Oral Daily  . l-methylfolate-B6-B12  1 tablet Oral BID  . pyridOXINE  50 mg Oral Daily  . sodium chloride  3 mL Intravenous Q12H     ASSESSMENT AND  PLAN: 72 yo with history of CAD, ischemic cardiomyopathy, BiV-ICD, PAF, and CKD presented with NSTEMI. Cardiac cath 04/17/13 with severe stenosis proximal RCA, now s/p DES x 1 proximal RCA.   1. CAD: NSTEMI. TnI to >20. Cardiac cath 04/17/13 with severe stenosis proximal RCA, now s/p DES x 1 proximal RCA. He has stable moderate disease in the LAD and ramus intermediate branch. Continue ASA, Plavix, atorvastatin, Coreg.   2. CKD: Creatinine stable post cath.    3. CHF: Chronic systolic CHF. He does not appear volume overloaded. No ACEI with renal dysfunction. Continue Coreg, hydralazine, and nitrates. Can continue current Lasix dose.   4. Anemia: Stable since admission. Had colonoscopy about 18 mos ago, apparently unremarkable. He is iron deficient, Continue FeSO4. He denies melena or BRBPR.   5. PAF: He has been in sinus rhythm here.    6. Dispo: Will plan ambulation this am with cardiac rehab then late morning discharge home if ok. Follow up in 2 weeks in [place] office with Dr. Diona Browner.    [name]  6/3/20146:44 AM

## 2013-04-18 NOTE — Progress Notes (Signed)
1515 Discharge instructions explained to pt and understanding was verbalized. PIV's were removed, catheters intact, and sites clean and dry. Pt was discharged home with wife.

## 2013-04-18 NOTE — Progress Notes (Signed)
Pt walking independently 1-2 laps at a time. Feels good. HR and BP stable. Ed completed and pt voiced understanding through teach back. Focused on NTG protocol as pt not taking correctly on admit and last 6 mos. Also discussed ex and DM. Pt sts A1C higher now that he is on insulin. To discuss with MD. Not interested in CRPII but sts he will ex on his own.  [phone] Ethelda Chick CES, ACSM 9:17 AM 04/18/2013

## 2013-04-19 NOTE — Discharge Summary (Signed)
See full note from 04/18/13. cdm

## 2013-04-20 NOTE — Telephone Encounter (Signed)
Patient contacted regarding discharge from [facility] on April 18, 2013.  Patient understands to follow up with provider [name] on 04/28/13 at  at 2:20 pm [facility]. Patient does understand discharge instructions. Patient does understand medications and regimen and has taken all doses without any side effects. Patient does understand to bring all medications to this visit. Patient states he feels good. No c/o chest pain, dizziness or sob.

## 2013-04-26 ENCOUNTER — Encounter: Payer: Self-pay | Admitting: Physician Assistant

## 2013-04-26 ENCOUNTER — Ambulatory Visit (INDEPENDENT_AMBULATORY_CARE_PROVIDER_SITE_OTHER): Payer: Medicare Other | Admitting: Physician Assistant

## 2013-04-26 VITALS — BP 117/56 | HR 62 | Ht 68.0 in | Wt 243.1 lb

## 2013-04-26 DIAGNOSIS — E785 Hyperlipidemia, unspecified: Secondary | ICD-10-CM | POA: Insufficient documentation

## 2013-04-26 DIAGNOSIS — I5022 Chronic systolic (congestive) heart failure: Secondary | ICD-10-CM

## 2013-04-26 DIAGNOSIS — Z79899 Other long term (current) drug therapy: Secondary | ICD-10-CM

## 2013-04-26 DIAGNOSIS — I255 Ischemic cardiomyopathy: Secondary | ICD-10-CM | POA: Insufficient documentation

## 2013-04-26 DIAGNOSIS — N183 Chronic kidney disease, stage 3 unspecified: Secondary | ICD-10-CM

## 2013-04-26 DIAGNOSIS — E782 Mixed hyperlipidemia: Secondary | ICD-10-CM

## 2013-04-26 DIAGNOSIS — I10 Essential (primary) hypertension: Secondary | ICD-10-CM

## 2013-04-26 DIAGNOSIS — I2589 Other forms of chronic ischemic heart disease: Secondary | ICD-10-CM

## 2013-04-26 DIAGNOSIS — R911 Solitary pulmonary nodule: Secondary | ICD-10-CM | POA: Insufficient documentation

## 2013-04-26 DIAGNOSIS — D649 Anemia, unspecified: Secondary | ICD-10-CM

## 2013-04-26 DIAGNOSIS — Z95810 Presence of automatic (implantable) cardiac defibrillator: Secondary | ICD-10-CM

## 2013-04-26 DIAGNOSIS — I2510 Atherosclerotic heart disease of native coronary artery without angina pectoris: Secondary | ICD-10-CM

## 2013-04-26 MED ORDER — NITROGLYCERIN 0.4 MG SL SUBL: 0.4000 mg | SUBLINGUAL_TABLET | SUBLINGUAL | Status: DC | PRN

## 2013-04-26 MED ORDER — CARVEDILOL 12.5 MG PO TABS: 12.5000 mg | ORAL_TABLET | Freq: Two times a day (BID) | ORAL | Status: DC

## 2013-04-26 NOTE — Assessment & Plan Note (Signed)
Euvolemic by history and exam. Patient reports compliance with his medications, weighs himself daily, and refrains from added salt in his diet. He had recent medication adjustment with addition of Imdur and increase in hydralazine dose. Recommend target carvedilol 25 twice a day, if BP remains stable.

## 2013-04-26 NOTE — Assessment & Plan Note (Signed)
Followed by Dr. Johney Frame. Status post recent interrogation at [facility], yielding no evidence of recurrent atrial fibrillation.

## 2013-04-26 NOTE — Assessment & Plan Note (Signed)
We'll reassess at time of next OV, following today's increase in carvedilol dose.

## 2013-04-26 NOTE — Assessment & Plan Note (Signed)
Recent workup consistent with iron deficiency anemia. Will check followup labs.

## 2013-04-26 NOTE — Patient Instructions (Signed)
   Fasting lipid & liver panel in 8 weeks - Due around June 26, 2013 - will send reminder in mail.  If this has already been done by another provider, please call office to notify.    Nitroglycerin as needed for severe chest pain only - refill sent to pharm  Labs for BMET, CBC Office will contact with results via phone or letter.    Stop Plendil Increase Coreg to 12.5mg  twice a day - new sent to pharm  Continue all other current medications.  Follow up in  1 month

## 2013-04-26 NOTE — Assessment & Plan Note (Signed)
Patient advised to followup with primary M.D. Recent CXR indicated possible developing nodule in R perihilar region, with recommendation for followup CT. This

## 2013-04-26 NOTE — Assessment & Plan Note (Signed)
Patient has documented history of statin intolerance, but was started on high dose Lipitor in the context of recent MI. LDL 69. Will plan on followup FLP in 8 weeks. Would suggest decreasing his Lipitor dose at that time, if LDL remains at or near target of 70, given patient's prior history of intolerance to this medication.

## 2013-04-26 NOTE — Assessment & Plan Note (Signed)
Will check followup labs 

## 2013-04-26 NOTE — Assessment & Plan Note (Signed)
Continued aggressive medical management and risk factor modification is recommended. Will DC Plendil and increase carvedilol to 12.5 twice a day. Patient to remain on DAPT with ASA/Plavix, indefinitely. We'll renew prescription for NTG. Will plan clinic followup in one month, with Dr. Diona Browner, for continued close monitoring and uptitration of his heart failure medication regimen, if possible.

## 2013-04-26 NOTE — Progress Notes (Addendum)
Primary Cardiologist: Simona Huh, MD   HPI: Post hospital followup from [facility], following recent presentation with NSTEMI Type 1 (troponin > 20).   - cardiac catheterization, June 2: 3v CAD; 99% proximal RCA stenosis; LVD deferred  Patient underwent subsequent successful PCI with PTCA/DES. Recommendation was to to continue DAPT with ASA/Plavix, indefinitely.   - echocardiogram, May 31: EF 35-40%; moderate LVH with grade 1 diastolic dysfunction  Patient also had interrogation of Medtronic BiV ICD, given history of remote atrial fibrillation, and this was negative.  He was also diagnosed with iron deficiency anemia. CXR yielded possible developing nodule in R perihilar region, for which was he instructed to follow with his primary M.D.  Medication adjustments notable for addition of Imdur and statin therapy (LDL 69), and increase in hydralazine dose.  Patient has CKD (stage III), followed by Dr. Kristian Covey, and maintained stable renal function, with creatinine of 1.3 at time of discharge.  He denies any recurrent anterior CP, throat tightness, or mid scapular pain, which were his presenting symptoms. He has resumed routine activities, including driving. He reports compliance with his medications, including recent addition of Imdur and Lipitor.  Allergies  Allergen Reactions  . Codeine     REACTION: blisters, but able to take hydrocodone    Current Outpatient Prescriptions  Medication Sig Dispense Refill  . acetaminophen (TYLENOL) 650 MG CR tablet Take 2 by mouth twice daily        . aspirin 81 MG tablet Take 1 tablet (81 mg total) by mouth daily.      Marland Kitchen atorvastatin (LIPITOR) 80 MG tablet Take 1 tablet (80 mg total) by mouth daily.  30 tablet  6  . carvedilol (COREG) 12.5 MG tablet Take 1 tablet (12.5 mg total) by mouth 2 (two) times daily.  60 tablet  6  . clopidogrel (PLAVIX) 75 MG tablet Take 1 tablet (75 mg total) by mouth daily.      . ferrous sulfate 325 (65 FE) MG tablet Take 1  tablet (325 mg total) by mouth 2 (two) times daily with a meal.  60 tablet  2  . furosemide (LASIX) 20 MG tablet Take 20 mg by mouth 2 (two) times daily.      . hydrALAZINE (APRESOLINE) 25 MG tablet Take 1 tablet (25 mg total) by mouth 3 (three) times daily.  90 tablet  6  . insulin lispro protamine-insulin lispro (HUMALOG 75/25) (75-25) 100 UNIT/ML SUSP Inject 10-35 Units into the skin. After every meal      . isosorbide mononitrate (IMDUR) 30 MG 24 hr tablet Take 1 tablet (30 mg total) by mouth daily.  30 tablet  6  . l-methylfolate-B6-B12 (METANX) 3-35-2 MG TABS Take 1 tablet by mouth every other day.      Marland Kitchen L-Methylfolate-B6-B12 (NEURPATH-B) 3-35-2 MG TABS Take one by mouth twice daily        . linagliptin (TRADJENTA) 5 MG TABS tablet Take 5 mg by mouth daily.      Marland Kitchen pyridoxine (B-6) 50 MG tablet Take 50 mg by mouth daily.        . Zinc 50 MG CAPS Take by mouth daily.        . nitroGLYCERIN (NITROSTAT) 0.4 MG SL tablet Place 1 tablet (0.4 mg total) under the tongue every 5 (five) minutes as needed for chest pain.  25 tablet  3   No current facility-administered medications for this visit.    Past Medical History  Diagnosis Date  . Type 2 diabetes mellitus   .  Coronary atherosclerosis of native coronary artery     a. DES to RCA 03/2009. b. s/p PTCA to RCA for ISR, 05/2010. c. NSTEMI 2/2 severe prox RCA s/p PTCa/DES, med rx for residual dz.  . Hyperlipidemia   . TIA (transient ischemic attack)     Multiple TIAs  . Cardiomyopathy, ischemic     a. LVEF 25-35%.  . Morbid obesity   . Mitral regurgitation     Mild  . BPH (benign prostatic hypertrophy)   . LBBB (left bundle branch block)     s/p BiV ICD Implanted by Dr Graciela Husbands  . Paroxysmal atrial fibrillation     a. Coumadin discontinued in 2010 when the patient required ASA/Plavix for stenting.  . Chronic kidney disease, stage III (moderate)     a. Followed by Dr. Fausto Skillern.  . HTN (hypertension)   . Anemia     a. Pt reports h/o anemia  - was offered IV iron in past by nephrologist but declined and has taken PO instead.  . Aneurysm, cerebral   . MRSA (methicillin resistant Staphylococcus aureus)   . Anemia   . Pulmonary nodule     CXR, 03/2013, [facility]    Past Surgical History  Procedure Laterality Date  . Cholecystectomy    . Total knee arthroplasty    . Vasectomy    . Lung surgery    . Icd placement      Medtronic Protecta XT CRT-D    History   Social History  . Marital Status: Married    Spouse Name: N/A    Number of Children: N/A  . Years of Education: N/A   Occupational History  . Not on file.   Social History Main Topics  . Smoking status: Former Smoker -- 1.00 packs/day for 6 years    Types: Cigarettes    Quit date: 11/16/1964  . Smokeless tobacco: Former Neurosurgeon    Quit date: 04/15/1965  . Alcohol Use: Yes     Comment: once/month  . Drug Use: No  . Sexually Active: Not on file   Other Topics Concern  . Not on file   Social History Narrative  . No narrative on file    Family History  Problem Relation Age of Onset  . Heart disease Other     family h/o premature cardiovascular disease    ROS: no nausea, vomiting; no fever, chills; no melena, hematochezia; no claudication  PHYSICAL EXAM: BP 117/56  Pulse 62  Ht 5\' 8"  (1.727 m)  Wt 243 lb 1.9 oz (110.279 kg)  BMI 36.97 kg/m2  SpO2 95% GENERAL: 72 year old male, obese; NAD HEENT: NCAT, PERRLA, EOMI; sclera clear; no xanthelasma NECK: palpable bilateral carotid pulses, no bruits; no JVD; no TM LUNGS: CTA bilaterally CARDIAC: RRR (S1, S2); no significant murmurs; no rubs or gallops ABDOMEN: soft, protuberant EXTREMETIES: no significant peripheral edema SKIN: warm/dry; no obvious rash/lesions MUSCULOSKELETAL: no joint deformity NEURO: no focal deficit; NL affect   EKG:    ASSESSMENT & PLAN:  Cardiomyopathy, ischemic Continued aggressive medical management and risk factor modification is recommended. Will DC Plendil and increase  carvedilol to 12.5 twice a day. Patient to remain on DAPT with ASA/Plavix, indefinitely. We'll renew prescription for NTG. Will plan clinic followup in one month, with Dr. Diona Browner, for continued close monitoring and uptitration of his heart failure medication regimen, if possible.  Hyperlipidemia Patient has documented history of statin intolerance, but was started on high dose Lipitor in the context of recent MI. LDL 69. Will  plan on followup FLP in 8 weeks. Would suggest decreasing his Lipitor dose at that time, if LDL remains at or near target of 70, given patient's prior history of intolerance to this medication.  HTN (hypertension) We'll reassess at time of next OV, following today's increase in carvedilol dose.  SYSTOLIC HEART FAILURE, CHRONIC Euvolemic by history and exam. Patient reports compliance with his medications, weighs himself daily, and refrains from added salt in his diet. He had recent medication adjustment with addition of Imdur and increase in hydralazine dose. Recommend target carvedilol 25 twice a day, if BP remains stable.  Automatic implantable cardioverter-defibrillator in situ Followed by Dr. Johney Frame. Status post recent interrogation at [facility], yielding no evidence of recurrent atrial fibrillation.  Chronic kidney disease, stage III (moderate) Will check followup labs  Anemia Recent workup consistent with iron deficiency anemia. Will check followup labs.  Pulmonary nodule Patient advised to followup with primary M.D. Recent CXR indicated possible developing nodule in R perihilar region, with recommendation for followup CT.    Gene Jossette Zirbel, PAC

## 2013-05-04 ENCOUNTER — Telehealth: Payer: Self-pay | Admitting: *Deleted

## 2013-05-04 NOTE — Telephone Encounter (Signed)
Message copied by Lesle Chris on Thu May 04, 2013  4:38 PM ------      Message from: Rande Brunt      Created: Fri Apr 28, 2013 11:12 AM       Creatinine 1.6. Repeat BMET in 5 days. ------

## 2013-05-04 NOTE — Telephone Encounter (Signed)
Notes Recorded by Lesle Chris, LPN on 1/61/0960 at 4:38 PM Has follow up OV scheduled for 7/3 with Dr. Diona Browner.  Notes Recorded by Lesle Chris, LPN on 4/54/0981 at 4:37 PM Patient notified. States he sees Dr. Kristian Covey and is due to see him soon. Will forward lab results to PMD Sherryll Burger) & [facility]. Advised him to go ahead and call [facility] for follow up. Patient verbalized understanding.  Notes Recorded by Lesle Chris, LPN on 1/91/4782 at 10:10 AM No answer at home number. Left message on cell number for return call.  Notes Recorded by Lesle Chris, LPN on 9/56/2130 at 10:12 AM No answer.

## 2013-05-04 NOTE — Telephone Encounter (Signed)
Patient retuning your call.

## 2013-05-12 ENCOUNTER — Emergency Department (HOSPITAL_COMMUNITY): Payer: Medicare Other

## 2013-05-12 ENCOUNTER — Emergency Department (HOSPITAL_COMMUNITY)
Admission: EM | Admit: 2013-05-12 | Discharge: 2013-05-12 | Disposition: A | Discharge status: Home / Self Care | Payer: Medicare Other | Attending: Emergency Medicine | Admitting: Emergency Medicine

## 2013-05-12 ENCOUNTER — Encounter (HOSPITAL_COMMUNITY): Payer: Self-pay | Admitting: Family Medicine

## 2013-05-12 DIAGNOSIS — R059 Cough, unspecified: Secondary | ICD-10-CM | POA: Insufficient documentation

## 2013-05-12 DIAGNOSIS — Z8679 Personal history of other diseases of the circulatory system: Secondary | ICD-10-CM | POA: Insufficient documentation

## 2013-05-12 DIAGNOSIS — R51 Headache: Secondary | ICD-10-CM | POA: Insufficient documentation

## 2013-05-12 DIAGNOSIS — Z79899 Other long term (current) drug therapy: Secondary | ICD-10-CM | POA: Insufficient documentation

## 2013-05-12 DIAGNOSIS — I129 Hypertensive chronic kidney disease with stage 1 through stage 4 chronic kidney disease, or unspecified chronic kidney disease: Secondary | ICD-10-CM | POA: Insufficient documentation

## 2013-05-12 DIAGNOSIS — R6883 Chills (without fever): Secondary | ICD-10-CM | POA: Insufficient documentation

## 2013-05-12 DIAGNOSIS — Z862 Personal history of diseases of the blood and blood-forming organs and certain disorders involving the immune mechanism: Secondary | ICD-10-CM | POA: Insufficient documentation

## 2013-05-12 DIAGNOSIS — Z8709 Personal history of other diseases of the respiratory system: Secondary | ICD-10-CM | POA: Insufficient documentation

## 2013-05-12 DIAGNOSIS — R05 Cough: Secondary | ICD-10-CM | POA: Insufficient documentation

## 2013-05-12 DIAGNOSIS — E785 Hyperlipidemia, unspecified: Secondary | ICD-10-CM | POA: Insufficient documentation

## 2013-05-12 DIAGNOSIS — N183 Chronic kidney disease, stage 3 unspecified: Secondary | ICD-10-CM | POA: Insufficient documentation

## 2013-05-12 DIAGNOSIS — Z7982 Long term (current) use of aspirin: Secondary | ICD-10-CM | POA: Insufficient documentation

## 2013-05-12 DIAGNOSIS — Z794 Long term (current) use of insulin: Secondary | ICD-10-CM | POA: Insufficient documentation

## 2013-05-12 DIAGNOSIS — R5383 Other fatigue: Secondary | ICD-10-CM | POA: Insufficient documentation

## 2013-05-12 DIAGNOSIS — I2510 Atherosclerotic heart disease of native coronary artery without angina pectoris: Secondary | ICD-10-CM | POA: Insufficient documentation

## 2013-05-12 DIAGNOSIS — R1110 Vomiting, unspecified: Secondary | ICD-10-CM | POA: Insufficient documentation

## 2013-05-12 DIAGNOSIS — Z87448 Personal history of other diseases of urinary system: Secondary | ICD-10-CM | POA: Insufficient documentation

## 2013-05-12 DIAGNOSIS — Z87891 Personal history of nicotine dependence: Secondary | ICD-10-CM | POA: Insufficient documentation

## 2013-05-12 DIAGNOSIS — E1129 Type 2 diabetes mellitus with other diabetic kidney complication: Secondary | ICD-10-CM | POA: Insufficient documentation

## 2013-05-12 DIAGNOSIS — R5381 Other malaise: Secondary | ICD-10-CM | POA: Insufficient documentation

## 2013-05-12 DIAGNOSIS — Z8614 Personal history of Methicillin resistant Staphylococcus aureus infection: Secondary | ICD-10-CM | POA: Insufficient documentation

## 2013-05-12 DIAGNOSIS — Z8673 Personal history of transient ischemic attack (TIA), and cerebral infarction without residual deficits: Secondary | ICD-10-CM | POA: Insufficient documentation

## 2013-05-12 DIAGNOSIS — N390 Urinary tract infection, site not specified: Secondary | ICD-10-CM | POA: Insufficient documentation

## 2013-05-12 DIAGNOSIS — R300 Dysuria: Secondary | ICD-10-CM | POA: Insufficient documentation

## 2013-05-12 DIAGNOSIS — R509 Fever, unspecified: Secondary | ICD-10-CM | POA: Insufficient documentation

## 2013-05-12 LAB — CBC WITH DIFFERENTIAL/PLATELET
Basophils Relative: 1 % (ref 0–1)
HCT: 31.3 % — ABNORMAL LOW (ref 39.0–52.0)
Hemoglobin: 10 g/dL — ABNORMAL LOW (ref 13.0–17.0)
Lymphocytes Relative: 19 % (ref 12–46)
Lymphs Abs: 1.1 10*3/uL (ref 0.7–4.0)
MCHC: 31.9 g/dL (ref 30.0–36.0)
Monocytes Absolute: 0.6 10*3/uL (ref 0.1–1.0)
Monocytes Relative: 11 % (ref 3–12)
Neutro Abs: 4.1 10*3/uL (ref 1.7–7.7)
Neutrophils Relative %: 69 % (ref 43–77)
RBC: 3.9 MIL/uL — ABNORMAL LOW (ref 4.22–5.81)

## 2013-05-12 LAB — BASIC METABOLIC PANEL
BUN: 21 mg/dL (ref 6–23)
Chloride: 94 mEq/L — ABNORMAL LOW (ref 96–112)
GFR calc Af Amer: 51 mL/min — ABNORMAL LOW (ref >90)
Potassium: 4.2 mEq/L (ref 3.5–5.1)
Sodium: 129 mEq/L — ABNORMAL LOW (ref 135–145)

## 2013-05-12 LAB — URINALYSIS, ROUTINE W REFLEX MICROSCOPIC
Bilirubin Urine: NEGATIVE
Hgb urine dipstick: NEGATIVE
Nitrite: NEGATIVE
Specific Gravity, Urine: 1.018 (ref 1.005–1.030)
pH: 5.5 (ref 5.0–8.0)

## 2013-05-12 LAB — URINE MICROSCOPIC-ADD ON

## 2013-05-12 MED ORDER — CEPHALEXIN 500 MG PO CAPS: 500.0000 mg | ORAL_CAPSULE | Freq: Four times a day (QID) | ORAL | Status: DC

## 2013-05-12 MED ORDER — LIDOCAINE HCL (PF) 1 % IJ SOLN
INTRAMUSCULAR | Status: AC
  Filled 2013-05-12: qty 5

## 2013-05-12 MED ORDER — CEFTRIAXONE SODIUM 1 G IJ SOLR
1.0000 g | Freq: Once | INTRAMUSCULAR | Status: AC
  Administered 2013-05-12: 1 g via INTRAMUSCULAR
  Filled 2013-05-12: qty 10

## 2013-05-12 NOTE — ED Notes (Signed)
Pt not I and O cathed -- urinated on own

## 2013-05-12 NOTE — ED Notes (Signed)
States "they also told me that I have a nodule on my lungs"

## 2013-05-12 NOTE — ED Notes (Signed)
Did not have to in and out cath pt. pt was able to urinate themselves.

## 2013-05-12 NOTE — ED Provider Notes (Signed)
History    CSN: [id number] Arrival date & time 05/12/13  1132  First MD Initiated Contact with Patient 05/12/13 1155     Chief Complaint  Patient presents with  . Urinary Frequency  . Chills    Patient is a 72 y.o. male presenting with frequency. The history is provided by the patient.  Urinary Frequency This is a new problem. The current episode started more than 2 days ago. The problem occurs daily. The problem has been gradually worsening. Associated symptoms include headaches. Pertinent negatives include no abdominal pain. Exacerbated by: urination. The symptoms are relieved by rest. Treatments tried: cipro. The treatment provided no relief.  pt reports he saw his PCP over 3 days ago for urinary frequency and dysuria He was told he "had prostate infection" and was placed on cipro Since then his symptoms have not improved.  He reports fever up to 103 earlier then took APAP He reports an episode of vomiting He reports cough He reports mild HA He reports his heart rate has been high but no ICD shocks He told nurse he had CP but did not report any to me  Past Medical History  Diagnosis Date  . Type 2 diabetes mellitus   . Coronary atherosclerosis of native coronary artery     a. DES to RCA 03/2009. b. s/p PTCA to RCA for ISR, 05/2010. c. NSTEMI 2/2 severe prox RCA s/p PTCa/DES, med rx for residual dz.  . Hyperlipidemia   . TIA (transient ischemic attack)     Multiple TIAs  . Cardiomyopathy, ischemic     a. LVEF 25-35%.  . Morbid obesity   . Mitral regurgitation     Mild  . BPH (benign prostatic hypertrophy)   . LBBB (left bundle branch block)     s/p BiV ICD Implanted by Dr Graciela Husbands  . Paroxysmal atrial fibrillation     a. Coumadin discontinued in 2010 when the patient required ASA/Plavix for stenting.  . Chronic kidney disease, stage III (moderate)     a. Followed by Dr. Fausto Skillern.  . HTN (hypertension)   . Anemia     a. Pt reports h/o anemia - was offered IV iron in past  by nephrologist but declined and has taken PO instead.  . Aneurysm, cerebral   . MRSA (methicillin resistant Staphylococcus aureus)   . Anemia   . Pulmonary nodule     CXR, 03/2013, [facility]   Past Surgical History  Procedure Laterality Date  . Cholecystectomy    . Total knee arthroplasty    . Vasectomy    . Lung surgery    . Icd placement      Medtronic Protecta XT CRT-D   Family History  Problem Relation Age of Onset  . Heart disease Other     family h/o premature cardiovascular disease   History  Substance Use Topics  . Smoking status: Former Smoker -- 1.00 packs/day for 6 years    Types: Cigarettes    Quit date: 11/16/1964  . Smokeless tobacco: Former Neurosurgeon    Quit date: 04/15/1965  . Alcohol Use: Yes     Comment: once/month    Review of Systems  Constitutional: Positive for fever and fatigue.  Respiratory: Positive for cough.   Gastrointestinal: Positive for vomiting. Negative for abdominal pain.  Genitourinary: Positive for dysuria and frequency.  Skin: Negative for rash.  Neurological: Positive for headaches.  All other systems reviewed and are negative.    Allergies  Codeine  Home Medications  Current Outpatient Rx  Name  Route  Sig  Dispense  Refill  . acetaminophen (TYLENOL) 650 MG CR tablet      Take 2 by mouth twice daily           . aspirin 81 MG tablet   Oral   Take 81 mg by mouth every morning.         . carvedilol (COREG) 12.5 MG tablet   Oral   Take 1 tablet (12.5 mg total) by mouth 2 (two) times daily.   60 tablet   6     Dose increased 04/26/2013   . ciprofloxacin (CIPRO) 500 MG tablet   Oral   Take 500 mg by mouth 2 (two) times daily. Take x14 days. Began regimen on 05/09/13.         . clopidogrel (PLAVIX) 75 MG tablet   Oral   Take 1 tablet (75 mg total) by mouth daily.         . furosemide (LASIX) 20 MG tablet   Oral   Take 20 mg by mouth 2 (two) times daily.         . hydrALAZINE (APRESOLINE) 25 MG tablet   Oral    Take 1 tablet (25 mg total) by mouth 3 (three) times daily.   90 tablet   6   . insulin lispro protamine-insulin lispro (HUMALOG 75/25) (75-25) 100 UNIT/ML SUSP   Subcutaneous   Inject 30-35 Units into the skin 3 (three) times daily after meals.          . isosorbide mononitrate (IMDUR) 30 MG 24 hr tablet   Oral   Take 30 mg by mouth daily.         Marland Kitchen l-methylfolate-B6-B12 (METANX) 3-35-2 MG TABS   Oral   Take 1 tablet by mouth every other day.         Marland Kitchen L-Methylfolate-B6-B12 (NEURPATH-B) 3-35-2 MG TABS   Oral   Take 1 tablet by mouth daily. Take one by mouth twice daily          . linagliptin (TRADJENTA) 5 MG TABS tablet   Oral   Take 5 mg by mouth daily.         . nitroGLYCERIN (NITROSTAT) 0.4 MG SL tablet   Sublingual   Place 0.4 mg under the tongue every 5 (five) minutes as needed for chest pain. For chest pain         . pyridoxine (B-6) 50 MG tablet   Oral   Take 50 mg by mouth at bedtime.          . Zinc 50 MG CAPS   Oral   Take 1 capsule by mouth at bedtime.           BP 140/63  Pulse 86  Temp(Src) 98.3 F (36.8 C) (Oral)  Resp 20  SpO2 95% Physical Exam CONSTITUTIONAL: Well developed/well nourished HEAD: Normocephalic/atraumatic EYES: EOMI/PERRL ENMT: Mucous membranes moist NECK: supple no meningeal signs SPINE:entire spine nontender CV: S1/S2 noted, no murmurs/rubs/gallops noted LUNGS: Lungs are clear to auscultation bilaterally, no apparent distress ABDOMEN: soft, nontender, no rebound or guarding GU:no cva tenderness Rectal - stool color normal.  Rectal tone present.  Prostate nontender, no prostatic mass/nodules noted, chaperone present NEURO: Pt is awake/alert, moves all extremitiesx4 EXTREMITIES: pulses normal, full ROM SKIN: warm, color normal PSYCH: no abnormalities of mood noted  ED Course  Procedures Labs Reviewed  URINALYSIS, ROUTINE W REFLEX MICROSCOPIC  BASIC METABOLIC PANEL  CBC WITH DIFFERENTIAL  12:45 PM Pt  reports recent dysuria, placed on cipro but does not feel improved.  He also reports cough with fever.  CXR ordered as well as repeat urine and labs.  Pt stable at this time  5:00 PM Pt improved uti noted, but did not prostate tenderness and I doubt prostatitis Since he still has symptoms, will ask him to stop cipro and start keflex and given rocephin Pt is well appearing not septic He asked about his nodule on CXR.  I did not see on this imaging but I asked him to f/u with his PCP for repeat imaging by his PCP  MDM  Nursing notes including past medical history and social history reviewed and considered in documentation xrays reviewed and considered Labs/vital reviewed and considered   Joya Gaskins, MD 05/12/13 1704

## 2013-05-12 NOTE — ED Notes (Addendum)
Per pt sts he is being treated for a prostate infection and not getting better. sts he has been having fever and chills. sts he will wake up in the middle of the night and urinate multiple times but during the day he has trouble.

## 2013-05-17 ENCOUNTER — Telehealth: Payer: Self-pay | Admitting: Cardiology

## 2013-05-17 NOTE — Telephone Encounter (Signed)
Mr. Regis was seen in ER at [facility]  On 6-30. He was given antibiotic and since taking the medication his heart rate is up. States that this am HR is 111. States when I walk across the room I am giving out. Is wanting to know about seeing a doctor or should he discontinue taking the antibiotic. Please call [phone].

## 2013-05-17 NOTE — Telephone Encounter (Signed)
Elevated HR 111 this morning and afterwards 101 and then 99 at 11:00 am. Patient concerned that the antibiotic he is taking could be causing his HR to elevated. Nurse advised patient that the infection he has may elevate his HR and that he should discuss this at office visit on tomorrow with the PA. Patient verbalized understanding of plan.

## 2013-05-18 ENCOUNTER — Encounter: Payer: Self-pay | Admitting: Physician Assistant

## 2013-05-18 ENCOUNTER — Ambulatory Visit (INDEPENDENT_AMBULATORY_CARE_PROVIDER_SITE_OTHER): Payer: Medicare Other | Admitting: Physician Assistant

## 2013-05-18 ENCOUNTER — Ambulatory Visit: Payer: Medicare Other | Admitting: Cardiology

## 2013-05-18 VITALS — BP 105/66 | HR 94 | Ht 68.0 in | Wt 247.0 lb

## 2013-05-18 DIAGNOSIS — I10 Essential (primary) hypertension: Secondary | ICD-10-CM

## 2013-05-18 DIAGNOSIS — I2510 Atherosclerotic heart disease of native coronary artery without angina pectoris: Secondary | ICD-10-CM

## 2013-05-18 DIAGNOSIS — I5022 Chronic systolic (congestive) heart failure: Secondary | ICD-10-CM

## 2013-05-18 DIAGNOSIS — D649 Anemia, unspecified: Secondary | ICD-10-CM

## 2013-05-18 MED ORDER — TORSEMIDE 20 MG PO TABS: 20.0000 mg | ORAL_TABLET | Freq: Every day | ORAL | Status: DC

## 2013-05-18 NOTE — Progress Notes (Signed)
Primary Cardiologist: Simona Huh, MD   HPI: Scheduled one-month followup.  At time of last OV, I stopped Plendil and increased Coreg to 12.5 twice a day.   - Followup labs, June 12: BUN 32, creatinine 1.6 (GFR 42), and potassium 4.5. H/H stable, 9.8/31  Patient recently diagnosed with UTI, and instructed to increase fluid intake. He presents today with 4 pound weight gain, complaint of peripheral edema, and worsening DOE. He denies PND and continues to sleep on 1 pillow. Denies CP, but also notes increased resting heart rate.  Allergies  Allergen Reactions  . Codeine     REACTION: blisters, but able to take hydrocodone    Current Outpatient Prescriptions  Medication Sig Dispense Refill  . acetaminophen (TYLENOL) 650 MG CR tablet Take 2 by mouth twice daily        . aspirin 81 MG tablet Take 81 mg by mouth every morning.      . carvedilol (COREG) 12.5 MG tablet Take 1 tablet (12.5 mg total) by mouth 2 (two) times daily.  60 tablet  6  . cephALEXin (KEFLEX) 500 MG capsule Take 1 capsule (500 mg total) by mouth 4 (four) times daily.  40 capsule  0  . clopidogrel (PLAVIX) 75 MG tablet Take 1 tablet (75 mg total) by mouth daily.      . furosemide (LASIX) 20 MG tablet Take 20 mg by mouth 2 (two) times daily.      . hydrALAZINE (APRESOLINE) 25 MG tablet Take 1 tablet (25 mg total) by mouth 3 (three) times daily.  90 tablet  6  . HYDROcodone-acetaminophen (NORCO) 10-325 MG per tablet Take 1 tablet by mouth every 8 (eight) hours as needed.       . insulin lispro protamine-insulin lispro (HUMALOG 75/25) (75-25) 100 UNIT/ML SUSP Inject 30-35 Units into the skin 3 (three) times daily after meals.       . isosorbide mononitrate (IMDUR) 30 MG 24 hr tablet Take 30 mg by mouth daily.      Marland Kitchen l-methylfolate-B6-B12 (METANX) 3-35-2 MG TABS Take 1 tablet by mouth every other day.      Marland Kitchen L-Methylfolate-B6-B12 (NEURPATH-B) 3-35-2 MG TABS Take 1 tablet by mouth daily. Take one by mouth twice daily       .  linagliptin (TRADJENTA) 5 MG TABS tablet Take 5 mg by mouth daily.      . nitroGLYCERIN (NITROSTAT) 0.4 MG SL tablet Place 0.4 mg under the tongue every 5 (five) minutes as needed for chest pain. For chest pain      . pyridoxine (B-6) 50 MG tablet Take 50 mg by mouth at bedtime.       . Zinc 50 MG CAPS Take 1 capsule by mouth at bedtime.       . torsemide (DEMADEX) 20 MG tablet Take 1 tablet (20 mg total) by mouth daily.  30 tablet  6   No current facility-administered medications for this visit.    Past Medical History  Diagnosis Date  . Type 2 diabetes mellitus   . Coronary atherosclerosis of native coronary artery     a. DES to RCA 03/2009. b. s/p PTCA to RCA for ISR, 05/2010. c. NSTEMI 2/2 severe prox RCA s/p PTCa/DES, med rx for residual dz.  . Hyperlipidemia   . TIA (transient ischemic attack)     Multiple TIAs  . Cardiomyopathy, ischemic     a. LVEF 25-35%.  . Morbid obesity   . Mitral regurgitation     Mild  .  BPH (benign prostatic hypertrophy)   . LBBB (left bundle branch block)     s/p BiV ICD Implanted by Dr Graciela Husbands  . Paroxysmal atrial fibrillation     a. Coumadin discontinued in 2010 when the patient required ASA/Plavix for stenting.  . Chronic kidney disease, stage III (moderate)     a. Followed by Dr. Fausto Skillern.  . HTN (hypertension)   . Anemia     a. Pt reports h/o anemia - was offered IV iron in past by nephrologist but declined and has taken PO instead.  . Aneurysm, cerebral   . MRSA (methicillin resistant Staphylococcus aureus)   . Anemia   . Pulmonary nodule     CXR, 03/2013, [facility]    Past Surgical History  Procedure Laterality Date  . Cholecystectomy    . Total knee arthroplasty    . Vasectomy    . Lung surgery    . Icd placement      Medtronic Protecta XT CRT-D    History   Social History  . Marital Status: Married    Spouse Name: N/A    Number of Children: N/A  . Years of Education: N/A   Occupational History  . Not on file.   Social History  Main Topics  . Smoking status: Former Smoker -- 1.00 packs/day for 6 years    Types: Cigarettes    Quit date: 11/16/1964  . Smokeless tobacco: Former Neurosurgeon    Quit date: 04/15/1965  . Alcohol Use: Yes     Comment: once/month  . Drug Use: No  . Sexually Active: Not on file   Other Topics Concern  . Not on file   Social History Narrative  . No narrative on file    Family History  Problem Relation Age of Onset  . Heart disease Other     family h/o premature cardiovascular disease    ROS: no nausea, vomiting; no fever, chills; no melena, hematochezia; no claudication  PHYSICAL EXAM: BP 105/66  Pulse 94  Ht 5\' 8"  (1.727 m)  Wt 247 lb (112.038 kg)  BMI 37.56 kg/m2 GENERAL: 72 year old male, obese; NAD  HEENT: NCAT, PERRLA, EOMI; sclera clear; no xanthelasma  NECK: Positive JVD on the right, at 90 LUNGS: CTA bilaterally  CARDIAC: RRR (S1, S2); no significant murmurs; no rubs or gallops  ABDOMEN: soft, protuberant  EXTREMETIES: Trace-1+ peripheral edema  SKIN: warm/dry; no obvious rash/lesions  MUSCULOSKELETAL: no joint deformity  NEURO: no focal deficit; NL affect    EKG:    ASSESSMENT & PLAN:  SYSTOLIC HEART FAILURE, CHRONIC Patient presents with 4 pound weight gain and evidence of volume overload, since last OV. He has been on Lasix for several years. Will discontinue this and initiate therapy with Demadex starting at 20 mg daily. Will check complete metabolic profile today, as well as a BNP level, and arrange for him to return to me in 2 weeks for close followup. Patient also advised to absolutely not add any salt to his food, not exceed 1.5 L fluid/24 hours, keep legs elevated whenever possible, and to continue to weigh himself daily. Will defer up titration of carvedilol at this time, which I increased to current dose at time of last OV. Moreover, patient has current relative hypotension.  ESSENTIAL HYPERTENSION, BENIGN Currently low normal. No medication  adjustments recommended today.  Anemia Stable by recent followup labs.  Chronic kidney disease, stage III (moderate) Creatinine increased to 1.6 at time of last OV. Will check followup labs today.  Gene Hannalee Castor, PAC

## 2013-05-18 NOTE — Assessment & Plan Note (Signed)
Currently low normal. No medication adjustments recommended today.

## 2013-05-18 NOTE — Assessment & Plan Note (Signed)
Stable by recent followup labs.

## 2013-05-18 NOTE — Assessment & Plan Note (Signed)
Creatinine increased to 1.6 at time of last OV. Will check followup labs today.

## 2013-05-18 NOTE — Patient Instructions (Signed)
   No added salt  Limit intake of fluids to 1.5 liter per 24 hour time frame  Labs today for CMET, BNP Office will contact with results via phone or letter.    Increase legs elevated Begin Demadex 20mg  daily - new sent to pharm Continue all other current medications. Follow up in  2 weeks

## 2013-05-18 NOTE — Assessment & Plan Note (Signed)
Patient presents with 4 pound weight gain and evidence of volume overload, since last OV. He has been on Lasix for several years. Will discontinue this and initiate therapy with Demadex starting at 20 mg daily. Will check complete metabolic profile today, as well as a BNP level, and arrange for him to return to me in 2 weeks for close followup. Patient also advised to absolutely not add any salt to his food, not exceed 1.5 L fluid/24 hours, keep legs elevated whenever possible, and to continue to weigh himself daily. Will defer up titration of carvedilol at this time, which I increased to current dose at time of last OV. Moreover, patient has current relative hypotension.

## 2013-05-22 ENCOUNTER — Telehealth: Payer: Self-pay | Admitting: Cardiology

## 2013-05-22 DIAGNOSIS — Z79899 Other long term (current) drug therapy: Secondary | ICD-10-CM

## 2013-05-22 DIAGNOSIS — R0602 Shortness of breath: Secondary | ICD-10-CM

## 2013-05-22 NOTE — Telephone Encounter (Signed)
Patient continues to have a lot of shortness of breath. States when he wakes across the room he is giving completely out of breath. Mrs. Vangieson states that she called the [facility] office wanting to speak with Dr. Sallee Provencal nurse and they transferred  her back to [place]. Patient states that he had more energy before his stent. Please call Bonita Quin (wife) [phone].  Marland Kitchen

## 2013-05-22 NOTE — Telephone Encounter (Signed)
Wife Bonita Quin) notified.  She will advise husband.  Will fax lab orders to [facility] now.

## 2013-05-22 NOTE — Telephone Encounter (Signed)
Recommend STAT bmet, bnp, and cbc. To call our office with results.

## 2013-05-22 NOTE — Telephone Encounter (Signed)
Discussed below with wife.  States he continues to have the SOB.  States that the SOB is no more than it was during OV on 7/3, but prior to the stents he was able to get up and walk around without difficulty.  Now has SOB with the least amount of exertion.  Has been doing fluid restriction as advised.

## 2013-05-26 ENCOUNTER — Emergency Department (HOSPITAL_COMMUNITY): Payer: Medicare Other

## 2013-05-26 ENCOUNTER — Encounter (HOSPITAL_COMMUNITY): Payer: Self-pay | Admitting: Emergency Medicine

## 2013-05-26 ENCOUNTER — Inpatient Hospital Stay (HOSPITAL_COMMUNITY)
Admission: EM | Admit: 2013-05-26 | Discharge: 2013-05-29 | DRG: 308 | Disposition: A | Discharge status: Home / Self Care | Payer: Medicare Other | Attending: Internal Medicine | Admitting: Internal Medicine

## 2013-05-26 DIAGNOSIS — E785 Hyperlipidemia, unspecified: Secondary | ICD-10-CM | POA: Diagnosis present

## 2013-05-26 DIAGNOSIS — I129 Hypertensive chronic kidney disease with stage 1 through stage 4 chronic kidney disease, or unspecified chronic kidney disease: Secondary | ICD-10-CM | POA: Diagnosis present

## 2013-05-26 DIAGNOSIS — I509 Heart failure, unspecified: Secondary | ICD-10-CM | POA: Diagnosis present

## 2013-05-26 DIAGNOSIS — Z8673 Personal history of transient ischemic attack (TIA), and cerebral infarction without residual deficits: Secondary | ICD-10-CM

## 2013-05-26 DIAGNOSIS — N400 Enlarged prostate without lower urinary tract symptoms: Secondary | ICD-10-CM | POA: Diagnosis present

## 2013-05-26 DIAGNOSIS — E119 Type 2 diabetes mellitus without complications: Secondary | ICD-10-CM | POA: Diagnosis present

## 2013-05-26 DIAGNOSIS — R911 Solitary pulmonary nodule: Secondary | ICD-10-CM | POA: Diagnosis present

## 2013-05-26 DIAGNOSIS — I447 Left bundle-branch block, unspecified: Secondary | ICD-10-CM | POA: Diagnosis present

## 2013-05-26 DIAGNOSIS — I5022 Chronic systolic (congestive) heart failure: Secondary | ICD-10-CM | POA: Diagnosis present

## 2013-05-26 DIAGNOSIS — I480 Paroxysmal atrial fibrillation: Secondary | ICD-10-CM | POA: Diagnosis present

## 2013-05-26 DIAGNOSIS — E663 Overweight: Secondary | ICD-10-CM | POA: Diagnosis present

## 2013-05-26 DIAGNOSIS — R079 Chest pain, unspecified: Secondary | ICD-10-CM

## 2013-05-26 DIAGNOSIS — I5023 Acute on chronic systolic (congestive) heart failure: Secondary | ICD-10-CM

## 2013-05-26 DIAGNOSIS — I10 Essential (primary) hypertension: Secondary | ICD-10-CM

## 2013-05-26 DIAGNOSIS — Z794 Long term (current) use of insulin: Secondary | ICD-10-CM

## 2013-05-26 DIAGNOSIS — N179 Acute kidney failure, unspecified: Secondary | ICD-10-CM | POA: Diagnosis present

## 2013-05-26 DIAGNOSIS — Z87440 Personal history of urinary (tract) infections: Secondary | ICD-10-CM

## 2013-05-26 DIAGNOSIS — D509 Iron deficiency anemia, unspecified: Secondary | ICD-10-CM | POA: Diagnosis present

## 2013-05-26 DIAGNOSIS — N183 Chronic kidney disease, stage 3 unspecified: Secondary | ICD-10-CM

## 2013-05-26 DIAGNOSIS — N189 Chronic kidney disease, unspecified: Secondary | ICD-10-CM

## 2013-05-26 DIAGNOSIS — Z87891 Personal history of nicotine dependence: Secondary | ICD-10-CM

## 2013-05-26 DIAGNOSIS — Z9861 Coronary angioplasty status: Secondary | ICD-10-CM

## 2013-05-26 DIAGNOSIS — I059 Rheumatic mitral valve disease, unspecified: Secondary | ICD-10-CM | POA: Diagnosis present

## 2013-05-26 DIAGNOSIS — I2589 Other forms of chronic ischemic heart disease: Secondary | ICD-10-CM | POA: Diagnosis present

## 2013-05-26 DIAGNOSIS — Z96659 Presence of unspecified artificial knee joint: Secondary | ICD-10-CM

## 2013-05-26 DIAGNOSIS — Z95810 Presence of automatic (implantable) cardiac defibrillator: Secondary | ICD-10-CM | POA: Diagnosis present

## 2013-05-26 DIAGNOSIS — Z79899 Other long term (current) drug therapy: Secondary | ICD-10-CM

## 2013-05-26 DIAGNOSIS — I4891 Unspecified atrial fibrillation: Principal | ICD-10-CM | POA: Diagnosis present

## 2013-05-26 DIAGNOSIS — I2510 Atherosclerotic heart disease of native coronary artery without angina pectoris: Secondary | ICD-10-CM | POA: Diagnosis present

## 2013-05-26 DIAGNOSIS — I252 Old myocardial infarction: Secondary | ICD-10-CM

## 2013-05-26 DIAGNOSIS — Z7902 Long term (current) use of antithrombotics/antiplatelets: Secondary | ICD-10-CM

## 2013-05-26 DIAGNOSIS — I255 Ischemic cardiomyopathy: Secondary | ICD-10-CM | POA: Diagnosis present

## 2013-05-26 LAB — TROPONIN I
Troponin I: <0.3 ng/mL (ref <0.30)
Troponin I: <0.3 ng/mL (ref <0.30)

## 2013-05-26 LAB — CBC WITH DIFFERENTIAL/PLATELET
Basophils Relative: 1 % (ref 0–1)
Eosinophils Absolute: 0.2 10*3/uL (ref 0.0–0.7)
Hemoglobin: 9.7 g/dL — ABNORMAL LOW (ref 13.0–17.0)
Lymphs Abs: 1.5 10*3/uL (ref 0.7–4.0)
MCH: 25.7 pg — ABNORMAL LOW (ref 26.0–34.0)
Monocytes Relative: 9 % (ref 3–12)
Neutro Abs: 6.7 10*3/uL (ref 1.7–7.7)
Neutrophils Relative %: 72 % (ref 43–77)
Platelets: 358 10*3/uL (ref 150–400)
RBC: 3.77 MIL/uL — ABNORMAL LOW (ref 4.22–5.81)

## 2013-05-26 LAB — PRO B NATRIURETIC PEPTIDE: Pro B Natriuretic peptide (BNP): 2644 pg/mL — ABNORMAL HIGH (ref 0–125)

## 2013-05-26 LAB — GLUCOSE, CAPILLARY: Glucose-Capillary: 100 mg/dL — ABNORMAL HIGH (ref 70–99)

## 2013-05-26 LAB — MAGNESIUM: Magnesium: 1.9 mg/dL (ref 1.5–2.5)

## 2013-05-26 LAB — BASIC METABOLIC PANEL
CO2: 28 mEq/L (ref 19–32)
Calcium: 9.2 mg/dL (ref 8.4–10.5)
GFR calc non Af Amer: 26 mL/min — ABNORMAL LOW (ref >90)
Potassium: 3.9 mEq/L (ref 3.5–5.1)
Sodium: 137 mEq/L (ref 135–145)

## 2013-05-26 LAB — PROTIME-INR: INR: 1.16 (ref 0.00–1.49)

## 2013-05-26 MED ORDER — LINAGLIPTIN 5 MG PO TABS
5.0000 mg | ORAL_TABLET | Freq: Every day | ORAL | Status: DC
  Administered 2013-05-27 – 2013-05-29 (×3): 5 mg via ORAL
  Filled 2013-05-26 (×4): qty 1

## 2013-05-26 MED ORDER — NITROGLYCERIN 0.4 MG SL SUBL: 0.4000 mg | SUBLINGUAL_TABLET | SUBLINGUAL | Status: DC | PRN

## 2013-05-26 MED ORDER — ASPIRIN 325 MG PO TABS
325.0000 mg | ORAL_TABLET | Freq: Every day | ORAL | Status: DC
  Administered 2013-05-27: 325 mg via ORAL
  Filled 2013-05-26 (×2): qty 1

## 2013-05-26 MED ORDER — SODIUM CHLORIDE 0.9 % IV SOLN
250.0000 mL | INTRAVENOUS | Status: DC | PRN
  Administered 2013-05-27 – 2013-05-28 (×2): 250 mL via INTRAVENOUS

## 2013-05-26 MED ORDER — INSULIN ASPART 100 UNIT/ML ~~LOC~~ SOLN
0.0000 [IU] | Freq: Three times a day (TID) | SUBCUTANEOUS | Status: DC
  Administered 2013-05-27 (×2): 3 [IU] via SUBCUTANEOUS
  Administered 2013-05-27: 5 [IU] via SUBCUTANEOUS
  Administered 2013-05-28: 3 [IU] via SUBCUTANEOUS
  Administered 2013-05-28: 5 [IU] via SUBCUTANEOUS
  Administered 2013-05-28: 3 [IU] via SUBCUTANEOUS
  Administered 2013-05-29: 5 [IU] via SUBCUTANEOUS

## 2013-05-26 MED ORDER — ATORVASTATIN CALCIUM 80 MG PO TABS
80.0000 mg | ORAL_TABLET | Freq: Every day | ORAL | Status: DC
  Administered 2013-05-26 – 2013-05-28 (×3): 80 mg via ORAL
  Filled 2013-05-26 (×5): qty 1

## 2013-05-26 MED ORDER — AMIODARONE HCL 200 MG PO TABS
400.0000 mg | ORAL_TABLET | Freq: Two times a day (BID) | ORAL | Status: DC
  Administered 2013-05-26 – 2013-05-29 (×6): 400 mg via ORAL
  Filled 2013-05-26 (×7): qty 2

## 2013-05-26 MED ORDER — L-METHYLFOLATE-B6-B12 3-35-2 MG PO TABS
1.0000 | ORAL_TABLET | ORAL | Status: DC
  Administered 2013-05-26 – 2013-05-28 (×2): 1 via ORAL
  Filled 2013-05-26 (×2): qty 1

## 2013-05-26 MED ORDER — CARVEDILOL 12.5 MG PO TABS
12.5000 mg | ORAL_TABLET | Freq: Two times a day (BID) | ORAL | Status: DC
  Administered 2013-05-26 – 2013-05-29 (×6): 12.5 mg via ORAL
  Filled 2013-05-26 (×7): qty 1

## 2013-05-26 MED ORDER — HYDROCODONE-ACETAMINOPHEN 10-325 MG PO TABS: 1.0000 | ORAL_TABLET | Freq: Three times a day (TID) | ORAL | Status: DC | PRN

## 2013-05-26 MED ORDER — FERROUS SULFATE 325 (65 FE) MG PO TABS
325.0000 mg | ORAL_TABLET | Freq: Two times a day (BID) | ORAL | Status: DC
Start: 2013-05-27 — End: 2013-05-29
  Administered 2013-05-27 – 2013-05-29 (×5): 325 mg via ORAL
  Filled 2013-05-26 (×7): qty 1

## 2013-05-26 MED ORDER — HEPARIN (PORCINE) IN NACL 100-0.45 UNIT/ML-% IJ SOLN
1500.0000 [IU]/h | INTRAMUSCULAR | Status: DC
  Administered 2013-05-26: 1300 [IU]/h via INTRAVENOUS
  Administered 2013-05-27 (×2): 1450 [IU]/h via INTRAVENOUS
  Administered 2013-05-27 – 2013-05-29 (×4): 1500 [IU]/h via INTRAVENOUS
  Filled 2013-05-26 (×6): qty 250

## 2013-05-26 MED ORDER — CLOPIDOGREL BISULFATE 75 MG PO TABS
75.0000 mg | ORAL_TABLET | Freq: Every day | ORAL | Status: DC
  Administered 2013-05-27 – 2013-05-29 (×3): 75 mg via ORAL
  Filled 2013-05-26 (×4): qty 1

## 2013-05-26 MED ORDER — ZOLPIDEM TARTRATE 5 MG PO TABS: 5.0000 mg | ORAL_TABLET | Freq: Every evening | ORAL | Status: DC | PRN

## 2013-05-26 MED ORDER — HYDRALAZINE HCL 25 MG PO TABS
25.0000 mg | ORAL_TABLET | Freq: Three times a day (TID) | ORAL | Status: DC
  Administered 2013-05-26 – 2013-05-29 (×8): 25 mg via ORAL
  Filled 2013-05-26 (×11): qty 1

## 2013-05-26 MED ORDER — ZINC 50 MG PO CAPS: 1.0000 | ORAL_CAPSULE | Freq: Every day | ORAL | Status: DC

## 2013-05-26 MED ORDER — ACETAMINOPHEN 325 MG PO TABS: 650.0000 mg | ORAL_TABLET | ORAL | Status: DC | PRN

## 2013-05-26 MED ORDER — WARFARIN SODIUM 7.5 MG PO TABS
7.5000 mg | ORAL_TABLET | Freq: Once | ORAL | Status: AC
  Administered 2013-05-26: 7.5 mg via ORAL
  Filled 2013-05-26: qty 1

## 2013-05-26 MED ORDER — WARFARIN VIDEO
1.0000 | Freq: Once | Status: AC
  Administered 2013-05-27: 1

## 2013-05-26 MED ORDER — ASPIRIN 81 MG PO CHEW
324.0000 mg | CHEWABLE_TABLET | Freq: Once | ORAL | Status: DC
  Filled 2013-05-26: qty 4

## 2013-05-26 MED ORDER — COUMADIN BOOK
1.0000 | Freq: Once | Status: AC
  Administered 2013-05-26: 1
  Filled 2013-05-26: qty 1

## 2013-05-26 MED ORDER — ISOSORBIDE MONONITRATE ER 30 MG PO TB24
30.0000 mg | ORAL_TABLET | Freq: Every day | ORAL | Status: DC
  Administered 2013-05-27 – 2013-05-29 (×3): 30 mg via ORAL
  Filled 2013-05-26 (×4): qty 1

## 2013-05-26 MED ORDER — HEPARIN BOLUS VIA INFUSION
4500.0000 [IU] | Freq: Once | INTRAVENOUS | Status: AC
  Administered 2013-05-26: 4500 [IU] via INTRAVENOUS
  Filled 2013-05-26: qty 4500

## 2013-05-26 MED ORDER — ONDANSETRON HCL 4 MG/2ML IJ SOLN: 4.0000 mg | Freq: Four times a day (QID) | INTRAMUSCULAR | Status: DC | PRN

## 2013-05-26 MED ORDER — ALPRAZOLAM 0.25 MG PO TABS: 0.2500 mg | ORAL_TABLET | Freq: Two times a day (BID) | ORAL | Status: DC | PRN

## 2013-05-26 MED ORDER — SODIUM CHLORIDE 0.9 % IJ SOLN: 3.0000 mL | INTRAMUSCULAR | Status: DC | PRN

## 2013-05-26 MED ORDER — WARFARIN - PHARMACIST DOSING INPATIENT: Freq: Every day | Status: DC

## 2013-05-26 MED ORDER — SODIUM CHLORIDE 0.9 % IJ SOLN
3.0000 mL | Freq: Two times a day (BID) | INTRAMUSCULAR | Status: DC
  Administered 2013-05-26: 3 mL via INTRAVENOUS

## 2013-05-26 MED ORDER — ZINC SULFATE 220 (50 ZN) MG PO CAPS
220.0000 mg | ORAL_CAPSULE | Freq: Every day | ORAL | Status: DC
  Administered 2013-05-27 – 2013-05-29 (×3): 220 mg via ORAL
  Filled 2013-05-26 (×4): qty 1

## 2013-05-26 NOTE — ED Notes (Signed)
Called Charge RN to interrogate device per cardiology request

## 2013-05-26 NOTE — Progress Notes (Signed)
Pt arrived to unit from ED.  Pt alert and oriented resting comfortably in bed.  Pt placed on telemetry and is currently Afib with intermittent paced beats.  Vitals taken and are 97.9 T, 104/72 BP, 113 P and 98% RA.  Pt has been oriented to the unit and has call bell within reach.  Will review transfer orders and carry out as ordered. Nino Glow RN

## 2013-05-26 NOTE — Progress Notes (Signed)
Pt alarming Vtach on telemetry.  Rhythm reviewed.  Pt appears to be in afib with frequent runs of a questionable arrythmia that returns to a baseline of a paced rhythm.  Pt asymptomatic with the runs denying any palpations, chest pain, lightheadedness, or dizziness.  Pt sitting in the recliner next to the bed and appears in no acute distress.  MD has been made aware.  No new orders at this time. Nino Glow RN

## 2013-05-26 NOTE — ED Provider Notes (Signed)
History    CSN: [id number] Arrival date & time 05/26/13  7829  First MD Initiated Contact with Patient 05/26/13 1012     Chief Complaint  Patient presents with  . Chest Pain   (Consider location/radiation/quality/duration/timing/severity/associated sxs/prior Treatment) HPI Comments: Patient comes to the ER for evaluation of exertional chest pain. Patient reports that he had an MI 5 weeks ago. He says that he did well for approximately 2 weeks after stenting, but in the last 3 weeks has noticed progressively worsening chest pain, shortness of breath and weakness with exertion. Patient reports that the symptoms feel like they were before his heart attack. He does indicate that the pain resolves if he rests. Arrival to the ER, he is resting comfortably in the bed, denies any current chest pain.  Patient is a 72 y.o. male presenting with chest pain.  Chest Pain Associated symptoms: shortness of breath    Past Medical History  Diagnosis Date  . Type 2 diabetes mellitus   . Coronary atherosclerosis of native coronary artery     a. DES to RCA 03/2009. b. s/p PTCA to RCA for ISR, 05/2010. c. NSTEMI 2/2 severe prox RCA s/p PTCa/DES, med rx for residual dz.  . Hyperlipidemia   . TIA (transient ischemic attack)     Multiple TIAs  . Cardiomyopathy, ischemic     a. LVEF 25-35%.  . Morbid obesity   . Mitral regurgitation     Mild  . BPH (benign prostatic hypertrophy)   . LBBB (left bundle branch block)     s/p BiV ICD Implanted by Dr Graciela Husbands  . Paroxysmal atrial fibrillation     a. Coumadin discontinued in 2010 when the patient required ASA/Plavix for stenting.  . Chronic kidney disease, stage III (moderate)     a. Followed by Dr. Fausto Skillern.  . HTN (hypertension)   . Anemia     a. Pt reports h/o anemia - was offered IV iron in past by nephrologist but declined and has taken PO instead.  . Aneurysm, cerebral   . MRSA (methicillin resistant Staphylococcus aureus)   . Anemia   . Pulmonary  nodule     CXR, 03/2013, [facility]   Past Surgical History  Procedure Laterality Date  . Cholecystectomy    . Total knee arthroplasty    . Vasectomy    . Lung surgery    . Icd placement      Medtronic Protecta XT CRT-D   Family History  Problem Relation Age of Onset  . Heart disease Other     family h/o premature cardiovascular disease   History  Substance Use Topics  . Smoking status: Former Smoker -- 1.00 packs/day for 6 years    Types: Cigarettes    Quit date: 11/16/1964  . Smokeless tobacco: Former Neurosurgeon    Quit date: 04/15/1965  . Alcohol Use: Yes     Comment: once/month    Review of Systems  Respiratory: Positive for shortness of breath.   Cardiovascular: Positive for chest pain.  All other systems reviewed and are negative.    Allergies  Codeine  Home Medications   Current Outpatient Rx  Name  Route  Sig  Dispense  Refill  . acetaminophen (TYLENOL) 650 MG CR tablet      Take 2 by mouth twice daily           . aspirin 81 MG tablet   Oral   Take 81 mg by mouth every morning.         Marland Kitchen  carvedilol (COREG) 12.5 MG tablet   Oral   Take 1 tablet (12.5 mg total) by mouth 2 (two) times daily.   60 tablet   6     Dose increased 04/26/2013   . cephALEXin (KEFLEX) 500 MG capsule   Oral   Take 1 capsule (500 mg total) by mouth 4 (four) times daily.   40 capsule   0   . clopidogrel (PLAVIX) 75 MG tablet   Oral   Take 1 tablet (75 mg total) by mouth daily.         . furosemide (LASIX) 20 MG tablet   Oral   Take 20 mg by mouth 2 (two) times daily.         . hydrALAZINE (APRESOLINE) 25 MG tablet   Oral   Take 1 tablet (25 mg total) by mouth 3 (three) times daily.   90 tablet   6   . HYDROcodone-acetaminophen (NORCO) 10-325 MG per tablet   Oral   Take 1 tablet by mouth every 8 (eight) hours as needed.          . insulin lispro protamine-insulin lispro (HUMALOG 75/25) (75-25) 100 UNIT/ML SUSP   Subcutaneous   Inject 30-35 Units into the skin 3  (three) times daily after meals.          . isosorbide mononitrate (IMDUR) 30 MG 24 hr tablet   Oral   Take 30 mg by mouth daily.         Marland Kitchen l-methylfolate-B6-B12 (METANX) 3-35-2 MG TABS   Oral   Take 1 tablet by mouth every other day.         Marland Kitchen L-Methylfolate-B6-B12 (NEURPATH-B) 3-35-2 MG TABS   Oral   Take 1 tablet by mouth daily. Take one by mouth twice daily          . linagliptin (TRADJENTA) 5 MG TABS tablet   Oral   Take 5 mg by mouth daily.         . nitroGLYCERIN (NITROSTAT) 0.4 MG SL tablet   Sublingual   Place 0.4 mg under the tongue every 5 (five) minutes as needed for chest pain. For chest pain         . pyridoxine (B-6) 50 MG tablet   Oral   Take 50 mg by mouth at bedtime.          . torsemide (DEMADEX) 20 MG tablet   Oral   Take 1 tablet (20 mg total) by mouth daily.   30 tablet   6   . Zinc 50 MG CAPS   Oral   Take 1 capsule by mouth at bedtime.           BP 122/108  Pulse 126  Temp(Src) 98.4 F (36.9 C) (Oral)  Resp 13  SpO2 100% Physical Exam  Constitutional: He is oriented to person, place, and time. He appears well-developed and well-nourished. No distress.  HENT:  Head: Normocephalic and atraumatic.  Right Ear: Hearing normal.  Left Ear: Hearing normal.  Nose: Nose normal.  Mouth/Throat: Oropharynx is clear and moist and mucous membranes are normal.  Eyes: Conjunctivae and EOM are normal. Pupils are equal, round, and reactive to light.  Neck: Normal range of motion. Neck supple.  Cardiovascular: Regular rhythm, S1 normal and S2 normal.  Exam reveals no gallop and no friction rub.   No murmur heard. Pulmonary/Chest: Effort normal and breath sounds normal. No respiratory distress. He exhibits no tenderness.  Abdominal: Soft. Normal appearance and bowel sounds  are normal. There is no hepatosplenomegaly. There is no tenderness. There is no rebound, no guarding, no tenderness at McBurney's point and negative Murphy's sign. No  hernia.  Musculoskeletal: Normal range of motion.  Neurological: He is alert and oriented to person, place, and time. He has normal strength. No cranial nerve deficit or sensory deficit. Coordination normal. GCS eye subscore is 4. GCS verbal subscore is 5. GCS motor subscore is 6.  Skin: Skin is warm, dry and intact. No rash noted. No cyanosis.  Psychiatric: He has a normal mood and affect. His speech is normal and behavior is normal. Thought content normal.    ED Course  Procedures (including critical care time)  EKG:  Date: 05/26/2013  Rate: 122  Rhythm: afib with intermittent v-pacing  QRS Axis: normal   ST/T Wave abnormalities: LBBB  Conduction Disutrbances:left bundle branch block  Narrative Interpretation:   Old EKG Reviewed: previously v-paced at 70, irregular tachycardia is new   Labs Reviewed  CBC WITH DIFFERENTIAL - Abnormal; Notable for the following:    RBC 3.77 (*)    Hemoglobin 9.7 (*)    HCT 31.1 (*)    MCH 25.7 (*)    RDW 17.7 (*)    All other components within normal limits  BASIC METABOLIC PANEL - Abnormal; Notable for the following:    Glucose, Bld 136 (*)    BUN 50 (*)    Creatinine, Ser 2.35 (*)    GFR calc non Af Amer 26 (*)    GFR calc Af Amer 30 (*)    All other components within normal limits  PRO B NATRIURETIC PEPTIDE - Abnormal; Notable for the following:    Pro B Natriuretic peptide (BNP) 2644.0 (*)    All other components within normal limits  TROPONIN I   Dg Chest 2 View  05/26/2013   *RADIOLOGY REPORT*  Clinical Data: Mid chest pain, shortness of breath, rapid heart rate  CHEST - 2 VIEW  Comparison: Chest x-ray of 05/12/2013  Findings: No active infiltrate or effusion is seen.  There is cardiomegaly present and may be minimal pulmonary vascular congestion present.  Defibrillator leads are noted.  There are degenerative changes throughout the thoracic spine.  IMPRESSION: Cardiomegaly.  Question minimal pulmonary vascular congestion.  Defibrillator leads are noted.   Original Report Authenticated By: Dwyane Dee, M.D.   Diagnosis: Angina, unstable  MDM  Patient presents to ER with exertional chest pain and shortness of breath ongoing for at least 3 weeks. He has recently had his Lasix increased to a cane and consideration for worsening congestive heart failure. He now, however, reports increasing exertional pain which is similar to what he had prior to his recent MI and stenting. EKG shows what appears to be underlying atrial fibrillation and left bundle branch block with intermittent ventricular pacing.  Patient's troponin is negative. He did have some episodes of tachycardia, mainly with exertion, such as getting up to use the restroom. Patient not considered a candidate for beta blockers at this time because of what appears to be slightly decompensated congestive heart failure. I did not administer any Cardizem because his blood pressure has been low normal and he did not want to drop his pressure. While lying in the bed at rest, heart rate is approximately 100 beats per minute. He is not experiencing any chest pain currently.  Cardiology consult to evaluate the patient.    Gilda Crease, MD 05/26/13 [phone]

## 2013-05-26 NOTE — ED Notes (Signed)
Patient states that he had an MI right about a month ago.   Patient states that he has had intermittent pain x 1 month.   Patient says it mostly happens when he's being active.   Patient claims that he gets short of breath when he has these "episodes".  Patient denies chest pain or any symptoms at this time.   Patient claims he "just wants to get answers, my cardiologist can't tell me anything.  I just went to see them on Monday, but evidently they don't understand".

## 2013-05-26 NOTE — Progress Notes (Signed)
ANTICOAGULATION CONSULT NOTE - Initial Consult  Pharmacy Consult for Heparin, Coumadin Indication: atrial fibrillation  Allergies  Allergen Reactions  . Codeine     REACTION: blisters, but able to take hydrocodone    Patient Measurements: Height: 5\' 8"  (172.7 cm) Weight: 239 lb (108.41 kg) (Scale B) IBW/kg (Calculated) : 68.4 Heparin Dosing Weight: 90  Vital Signs: Temp: 97.9 F (36.6 C) (07/11 1744) Temp src: Oral (07/11 1744) BP: 104/72 mmHg (07/11 1744) Pulse Rate: 113 (07/11 1744)  Labs:  Recent Labs  05/26/13 1129 05/26/13 1220  HGB 9.7*  --   HCT 31.1*  --   PLT 358  --   CREATININE 2.35*  --   TROPONINI  --  <0.30    Estimated Creatinine Clearance: 33.4 ml/min (by C-G formula based on Cr of 2.35).   Medical History: Past Medical History  Diagnosis Date  . Type 2 diabetes mellitus   . Coronary atherosclerosis of native coronary artery     a. DES to RCA 03/2009. b. s/p PTCA to RCA for ISR, 05/2010. c. 03/2013 NSTEMI 2/2 severe prox RCA s/p PTCa/DES, med rx for residual dz.  . Hyperlipidemia   . TIA (transient ischemic attack)     Multiple TIAs  . Cardiomyopathy, ischemic     a. LVEF 25-35%.  . Morbid obesity   . Mitral regurgitation     Mild  . BPH (benign prostatic hypertrophy)   . LBBB (left bundle branch block)     s/p BiV ICD Implanted by Dr Graciela Husbands  . Paroxysmal atrial fibrillation     a. Coumadin discontinued in 2010 when the patient required ASA/Plavix for stenting.  . Chronic kidney disease, stage III (moderate)     a. Followed by Dr. Fausto Skillern.  . HTN (hypertension)   . Anemia     a. Pt reports h/o anemia - was offered IV iron in past by nephrologist but declined and has taken PO instead.  . Aneurysm, cerebral   . MRSA (methicillin resistant Staphylococcus aureus)   . Anemia   . Pulmonary nodule     CXR, 03/2013, [facility]    Medications:  Scheduled:  . amiodarone  400 mg Oral BID  . aspirin  325 mg Oral Daily  . atorvastatin  80 mg Oral  q1800  . carvedilol  12.5 mg Oral BID  . clopidogrel  75 mg Oral Daily  . [START ON 05/27/2013] ferrous sulfate  325 mg Oral BID WC  . hydrALAZINE  25 mg Oral TID  . [START ON 05/27/2013] insulin aspart  0-15 Units Subcutaneous TID WC  . isosorbide mononitrate  30 mg Oral Daily  . l-methylfolate-B6-B12  1 tablet Oral QODAY  . linagliptin  5 mg Oral Daily  . sodium chloride  3 mL Intravenous Q12H  . zinc sulfate  220 mg Oral Daily    Assessment: 72yo male with multiple medical problems, presenting with AFib which is causing worsening of his CHF symptoms.  He had previously taken Coumadin, but this was stopped in 2010 for ASA/Plavix due to stent placement.  Now to add Coumadin back and start Heparin.  Per discussion with pt, he had no bleeding problems with Coumadin in the past.  Goal of Therapy:  INR 2-3, Heparin level 0.3-07 Monitor platelets by anticoagulation protocol: Yes   Plan:  1.  Heparin 4500 units IV x1, 1300 units/hr 2.  Heparin level and CBC in 8hr, then daily 3.  Coumadin 7.5mg  x 1, book & video 4.  Daily INR  Gracy Bruins, PharmD Clinical Pharmacist [facility]

## 2013-05-26 NOTE — ED Notes (Signed)
Report given to floor nurse, Toni Amend, RN. Nurse has no further questions upon report given. Pt being prepared for transport to floor.

## 2013-05-26 NOTE — H&P (Addendum)
History and Physical   Patient ID: [name] MRN: [id number], DOB/AGE: August 26, 1941   Admit date: 05/26/2013 Date of Consult: 05/26/2013   Primary Physician: [name], MD Primary Cardiologist: Ival Bible, MD  HPI: [name] is a 72 y.o. male with a complex cardiac and past medical history. He has a history s/p multiple PCIs, ischemic cardiomyopathy (EF 35-40% 03/2013) s/p Medtronic CRT-D, PAF (on ASA/Plavix for CAD), CKD (stage III, baseline Cr 1.1-1.2, followed by Dr. Fausto Skillern), h/o multiple TIAs, cerebral aneurysm, morbid obesity, LBBB, DM2, HLD, iron deficiency anemia and recently discovered pulmonary nodule.  The patient has received DES-RCA in 03/2009. He underwent subsequent PTCA to RCA for ISR in 05/2010. He was admitted to [facility] 04/14/13 for NSTEMI. He had complained of exertional chest pain, 30 lbs weight gain and DOE. He again underwent cardiac catheterization revealing 99% prox RCA stenosis s/p DES. He has been continued on lifetime ASA/Plavix. Note, the patient has had multiple occurrences of RCA stenosis and restenosis. It is a heavily calcified vessel. He had a borderline 70% LAD lesion (FFR 0.88 in 2011), 70% PDA lesion both unchanged from prior cath. Recommendations for bypass grafting have been recommended should RCA restenosis develop or LAD lesions progress. 2D echo 04/15/13: EF 35-40%, moderate LVH, grade 1 diastolic dysfunction, mild LA dilatation. ICD interrogation revealed no evidence of a-fib. Cr 1.27 on discharge.  He presented to the ED 6/27 for a UTI and was treated with antibiotics. Cr 1.52. He was advised to increase fluid intake. There are telephone notes 7/2 and 7/7 in which the patient c/o increased HR at 111 and DOE. He followed up in the office with Gene Serpe, PA-C on 7/3. He c/o 4 lbs weight gain, peripheral edema and worsening DOE. Denied chest pain. Weight 247 lbs. Lasix was switched with Demadex. He was advised to restrict salt/fluid intake, elevate  his legs and weigh himself daily. Unable to find EKG or interpretation at this time.   His weight continued to decline to as low as 238-239 lbs today. He has noticed improved orthopnea, but now increased DOE walking 100-150 feet with associated substernal chest pressure c/w prior MI. At baseline, he is able to perform yardwork without incident. He denies LE edema, PND, palpitations or syncope. He has maintained a strict regimen of fluid/salt restriction. He has completed a 10 day course of Keflex for persistent UTI. He has adhered to ASA/Plavix.   In the ED, EKG reveals atrial fibrillation with frequent NSVT. Initial trop-I WNL. pBNP 2644. BMET- BUN 50, Cr 2.35. CBC- Hgb 9.7/Hct 31.1 (baseline). CXR- cardiomegaly, question minimal pulmonary vascular congestion.   Problem List: Past Medical History  Diagnosis Date  . Type 2 diabetes mellitus   . Coronary atherosclerosis of native coronary artery     a. DES to RCA 03/2009. b. s/p PTCA to RCA for ISR, 05/2010. c. NSTEMI 2/2 severe prox RCA s/p PTCa/DES, med rx for residual dz.  . Hyperlipidemia   . TIA (transient ischemic attack)     Multiple TIAs  . Cardiomyopathy, ischemic     a. LVEF 25-35%.  . Morbid obesity   . Mitral regurgitation     Mild  . BPH (benign prostatic hypertrophy)   . LBBB (left bundle branch block)     s/p BiV ICD Implanted by Dr Graciela Husbands  . Paroxysmal atrial fibrillation     a. Coumadin discontinued in 2010 when the patient required ASA/Plavix for stenting.  . Chronic kidney disease, stage III (moderate)  a. Followed by Dr. Fausto Skillern.  . HTN (hypertension)   . Anemia     a. Pt reports h/o anemia - was offered IV iron in past by nephrologist but declined and has taken PO instead.  . Aneurysm, cerebral   . MRSA (methicillin resistant Staphylococcus aureus)   . Anemia   . Pulmonary nodule     CXR, 03/2013, [facility]    Past Surgical History  Procedure Laterality Date  . Cholecystectomy    . Total knee arthroplasty    .  Vasectomy    . Lung surgery    . Icd placement      Medtronic Protecta XT CRT-D     Allergies:  Allergies  Allergen Reactions  . Codeine     REACTION: blisters, but able to take hydrocodone    Home Medications: Prior to Admission medications   Medication Sig Start Date End Date Taking? Authorizing Provider  acetaminophen (TYLENOL) 650 MG CR tablet Take 2 by mouth twice daily     Yes Historical Provider, MD  aspirin 325 MG tablet Take 325 mg by mouth daily.   Yes Historical Provider, MD  carvedilol (COREG) 12.5 MG tablet Take 1 tablet (12.5 mg total) by mouth 2 (two) times daily. 04/26/13  Yes Prescott Parma, PA-C  clopidogrel (PLAVIX) 75 MG tablet Take 1 tablet (75 mg total) by mouth daily. 04/18/13  Yes Dayna N Dunn, PA-C  ferrous sulfate 325 (65 FE) MG tablet Take 325 mg by mouth 2 (two) times daily with a meal.   Yes Historical Provider, MD  hydrALAZINE (APRESOLINE) 25 MG tablet Take 1 tablet (25 mg total) by mouth 3 (three) times daily. 04/18/13  Yes Dayna N Dunn, PA-C  HYDROcodone-acetaminophen (NORCO) 10-325 MG per tablet Take 1 tablet by mouth every 8 (eight) hours as needed.  05/15/13  Yes Historical Provider, MD  insulin lispro protamine-insulin lispro (HUMALOG 75/25) (75-25) 100 UNIT/ML SUSP Inject 30-35 Units into the skin 3 (three) times daily after meals.    Yes Historical Provider, MD  isosorbide mononitrate (IMDUR) 30 MG 24 hr tablet Take 30 mg by mouth daily. 04/18/13  Yes Dayna N Dunn, PA-C  l-methylfolate-B6-B12 (METANX) 3-35-2 MG TABS Take 1 tablet by mouth every other day.   Yes Historical Provider, MD  L-Methylfolate-B6-B12 (NEURPATH-B) 3-35-2 MG TABS Take 1 tablet by mouth 2 (two) times daily.    Yes Historical Provider, MD  linagliptin (TRADJENTA) 5 MG TABS tablet Take 5 mg by mouth daily.   Yes Historical Provider, MD  nitroGLYCERIN (NITROSTAT) 0.4 MG SL tablet Place 0.4 mg under the tongue every 5 (five) minutes as needed for chest pain. For chest pain 04/26/13  Yes Prescott Parma, PA-C  pyridoxine (B-6) 50 MG tablet Take 50 mg by mouth daily.    Yes Historical Provider, MD  torsemide (DEMADEX) 20 MG tablet Take 20 mg by mouth 2 (two) times daily.   Yes Historical Provider, MD  Zinc 50 MG CAPS Take 1 capsule by mouth daily.    Yes Historical Provider, MD  cephALEXin (KEFLEX) 500 MG capsule Take 1 capsule (500 mg total) by mouth 4 (four) times daily. 05/12/13   Joya Gaskins, MD    Inpatient Medications:    (Not in a hospital admission)  Family History  Problem Relation Age of Onset  . Heart disease Other     family h/o premature cardiovascular disease     History   Social History  . Marital Status: Married    Spouse Name:  N/A    Number of Children: N/A  . Years of Education: N/A   Occupational History  . Not on file.   Social History Main Topics  . Smoking status: Former Smoker -- 1.00 packs/day for 6 years    Types: Cigarettes    Quit date: 11/16/1964  . Smokeless tobacco: Former Neurosurgeon    Quit date: 04/15/1965  . Alcohol Use: Yes     Comment: once/month  . Drug Use: No  . Sexually Active: Not on file   Other Topics Concern  . Not on file   Social History Narrative  . No narrative on file    Review of Systems: General: negative for chills, fever, night sweats or weight changes.  Cardiovascular: positive for chest pain, dyspnea on exertion, orthopnea, negative for edema, palpitations, paroxysmal nocturnal dyspnea Dermatological: negative for rash Respiratory: negative for cough or wheezing Urologic: negative for hematuria Abdominal: negative for nausea, vomiting, diarrhea, bright red blood per rectum, melena, or hematemesis Neurologic: negative for visual changes, syncope, or dizziness All other systems reviewed and are otherwise negative except as noted above.  Physical Exam: Blood pressure 104/64, pulse 103, temperature 98.4 F (36.9 C), temperature source Oral, resp. rate 14, SpO2 99.00%.   General: Obese appearing male in  no acute distress. Head: Normocephalic, atraumatic, sclera non-icteric, no xanthomas, nares are without discharge.  Neck: Negative for carotid bruits. JVP 8-9 cm.  Lungs: Distant breath sounds. No appreciable wheezes, rales, or rhonchi. Breathing is unlabored. Heart: Irregularly irregular, ith S1 S2. No murmurs, rubs, or gallops appreciated. Abdomen: Soft, non-tender, distention of central adiposity, normoactive bowel sounds. No hepatomegaly. No rebound/guarding. No obvious abdominal masses. Msk:  Strength and tone appears normal for age. Extremities: No clubbing, cyanosis or edema.  Distal pedal pulses are 2+ and equal bilaterally. Neuro: Alert and oriented X 3. Moves all extremities spontaneously. Psych:  Responds to questions appropriately with a normal affect.  Labs: Recent Labs     05/26/13  1129  WBC  9.3  HGB  9.7*  HCT  31.1*  MCV  82.5  PLT  358   Recent Labs Lab 05/26/13 1129  NA 137  K 3.9  CL 96  CO2 28  BUN 50*  CREATININE 2.35*  CALCIUM 9.2  GLUCOSE 136*   Recent Labs     05/26/13  1220  TROPONINI  <0.30   Radiology/Studies: Dg Chest 2 View  05/26/2013   *RADIOLOGY REPORT*  Clinical Data: Mid chest pain, shortness of breath, rapid heart rate  CHEST - 2 VIEW  Comparison: Chest x-ray of 05/12/2013  Findings: No active infiltrate or effusion is seen.  There is cardiomegaly present and may be minimal pulmonary vascular congestion present.  Defibrillator leads are noted.  There are degenerative changes throughout the thoracic spine.  IMPRESSION: Cardiomegaly.  Question minimal pulmonary vascular congestion. Defibrillator leads are noted.   Original Report Authenticated By: Dwyane Dee, M.D.   Dg Chest 2 View  05/12/2013   *RADIOLOGY REPORT*  Clinical Data: Cough.  Chills.  Urinary tract infection.  CHEST - 2 VIEW  Comparison: 04/14/2013  Findings: Transvenous defibrillator in place.  Overall heart size and pulmonary vascularity are within normal limits.  No  infiltrates or effusions.  No acute osseous abnormality.  IMPRESSION: No acute disease.   Original Report Authenticated By: Francene Boyers, M.D.   EKG: atrial fibrillation, LBBB, intermittent NSVT, 101-122 bpm, no ST/T changes  ASSESSMENT AND PLAN:   1. Acute on chronic combined CHF/ischemic cardiomyopathy  -  EF 35-40% on 03/2013 echo  - S/p Medtronic CRT-D 2. CAD s/p multiple PCIs 3. PAF with RVR   - Apparent on EKG today, last episode 10-12 years ago  - No evidence of AF on ICD interrogation 03/2013 4. A/CKD, stage III 5. History of TIA 6. NSVT 7. Type 2 DM 8. Hyperlipidemia 9. Iron deficiency anemia 10. R perihilar pulmonary nodule  The patient presents back to the ED with c/o progressively worsening DOE and exertional chest pain walking 100-150 feet (able to do yardwork at baseline). Yet he has noticed progressive weight decline and improved orthopnea since Lasix was switched to Demadex on 05/18/13. Two new findings in the ED today include a-fib with RVR (rate 100-120s) and A/CKD (Cr 2.35). Suspect the patient's symptoms are secondary to paroxysm of atrial fibrillation evident on EKG and telemetry today which may have been exacerbated by recent UTI or renal failure from over diuresis. Prior to this episode, he had maintained NSR for several years. Can attempt amiodarone vs rate-control for management. Will need to address anticoagulation. CHADSVASc = 5. He requires lifelong DAPT- ASA/Plavix and adding an anticoagulant would increase the patient's risk of bleeding. He is at increased bleeding risk and has iron deficiency anemia at baseline. Will need to formally rule out MI as well given his extensive CAD history, recent DOE/exertional chest pain reminiscent of prior MI and ischemic-mediated arrhythmia. If negative, consider noninvasive stress testing to evaluate for progressive ischemia (LAD/PDA disease)/RCA stent patency. Check u/a for UTI- if positive, send urine culture/sensitivities. Consult  nephrology for management of renal function. TSH WNL 04/15/13. SSI while inpatient.    Signed, R. Hurman Horn, PA-C 05/26/2013, 2:41 PM   Patient seen and examined independently. Hurman Horn, PA note reviewed carefully - agree with his assessment and plan. I have edited the note based on my findings.   Suspect majority of his symptoms are due to recurrent AF. Will admit for TEE/DCCV on Monday. Will begin load with po amiodarone to help maintain NSR. Given high CHADS score will likely need coumadin and I would favor coumadin/Plavix and stop ASA once INR > 2.0/ Will bridge with heparin. He appears intravascularly volume depleted so will hold diuretics and watch renal function closely.  Will interrogate ICD to see timing of AF onset. Can reassess CP symptoms once back in NSR to assess for need for further ischemic testing. Will also need outpatient sleep study.   Daniel Bensimhon,MD 4:31 PM

## 2013-05-27 ENCOUNTER — Encounter (HOSPITAL_COMMUNITY): Payer: Self-pay | Admitting: Internal Medicine

## 2013-05-27 DIAGNOSIS — I480 Paroxysmal atrial fibrillation: Secondary | ICD-10-CM | POA: Diagnosis present

## 2013-05-27 LAB — HEPARIN LEVEL (UNFRACTIONATED): Heparin Unfractionated: 0.33 IU/mL (ref 0.30–0.70)

## 2013-05-27 LAB — GLUCOSE, CAPILLARY
Glucose-Capillary: 184 mg/dL — ABNORMAL HIGH (ref 70–99)
Glucose-Capillary: 185 mg/dL — ABNORMAL HIGH (ref 70–99)

## 2013-05-27 LAB — CBC
MCH: 25 pg — ABNORMAL LOW (ref 26.0–34.0)
MCV: 82 fL (ref 78.0–100.0)
Platelets: 361 10*3/uL (ref 150–400)
RBC: 3.56 MIL/uL — ABNORMAL LOW (ref 4.22–5.81)

## 2013-05-27 LAB — BASIC METABOLIC PANEL
Calcium: 8.8 mg/dL (ref 8.4–10.5)
Creatinine, Ser: 1.69 mg/dL — ABNORMAL HIGH (ref 0.50–1.35)
GFR calc Af Amer: 45 mL/min — ABNORMAL LOW (ref >90)

## 2013-05-27 LAB — TROPONIN I: Troponin I: <0.3 ng/mL (ref <0.30)

## 2013-05-27 MED ORDER — ASPIRIN 81 MG PO CHEW
81.0000 mg | CHEWABLE_TABLET | Freq: Every day | ORAL | Status: DC
  Administered 2013-05-27 – 2013-05-29 (×3): 81 mg via ORAL
  Filled 2013-05-27 (×3): qty 1

## 2013-05-27 MED ORDER — WARFARIN SODIUM 7.5 MG PO TABS
7.5000 mg | ORAL_TABLET | Freq: Once | ORAL | Status: AC
  Administered 2013-05-27: 7.5 mg via ORAL
  Filled 2013-05-27: qty 1

## 2013-05-27 NOTE — Progress Notes (Addendum)
Sitting up in chair, tolerating food brought in without difficulty, although c/o nausea earlier to MD.  No vomiting noted.   1732 checked with MD prior to administering hydralazine due to low systolic BP of 98.  Client is asymptomatic and med is to be given and continue to monitor.

## 2013-05-27 NOTE — Progress Notes (Signed)
Patient Name: [name]      SUBJECTIVE: Admitted with congestive heart failure. He has a history of ischemic cardiomyopathy with multiple prior PCI as well as prior CRT. His history of PAF with multiple TIAs and a history of a cerebral aneurysm and is treated with aspirin/Plavix. Most recently he had a non-STEMI 5/14 at which time he had restenosis of his RCA for which a DES was placed. He is a borderline LAD lesion-FFR 0.88. Ejection fraction is 35-40% He's had recent symptoms of increasing shortness of breath somewhat responsive to diuretics. He also noted an irregular heart rate and was found in the emergency room to be in atrial fibrillation  he was started on amiodarone and Coumadin with the plan to transition blood thinners from aspirin/Plavix to Coumadin/Plavix. He is also set for TE cardioversion  Some nausea this am; no cp or sob   His hx of TIAs interesting "multiple times in a day" Dr Sandria Manly Rx with coumadin with full resolution  Past Medical History  Diagnosis Date  . Type 2 diabetes mellitus   . Coronary atherosclerosis of native coronary artery     a. DES to RCA 03/2009. b. s/p PTCA to RCA for ISR, 05/2010. c. 03/2013 NSTEMI 2/2 severe prox RCA s/p PTCa/DES, med rx for residual dz.  . Hyperlipidemia   . TIA (transient ischemic attack)     Multiple TIAs  . Cardiomyopathy, ischemic     a. LVEF 25-35%.  . Morbid obesity   . Mitral regurgitation     Mild  . BPH (benign prostatic hypertrophy)   . LBBB (left bundle branch block)     s/p BiV ICD Implanted by Dr Graciela Husbands  . Paroxysmal atrial fibrillation     a. Coumadin discontinued in 2010 when the patient required ASA/Plavix for stenting.  . Chronic kidney disease, stage III (moderate)     a. Followed by Dr. Fausto Skillern.  . HTN (hypertension)   . Anemia-chronic     a. Pt reports h/o anemia - was offered IV iron in past by nephrologist but declined and has taken PO instead.  . Aneurysm, cerebral   . MRSA (methicillin  resistant Staphylococcus aureus)   . Anemia   . Pulmonary nodule     CXR, 03/2013, [facility]    Scheduled Meds:  Scheduled Meds: . amiodarone  400 mg Oral BID  . aspirin  325 mg Oral Daily  . atorvastatin  80 mg Oral q1800  . carvedilol  12.5 mg Oral BID  . clopidogrel  75 mg Oral Daily  . ferrous sulfate  325 mg Oral BID WC  . hydrALAZINE  25 mg Oral TID  . insulin aspart  0-15 Units Subcutaneous TID WC  . isosorbide mononitrate  30 mg Oral Daily  . l-methylfolate-B6-B12  1 tablet Oral QODAY  . linagliptin  5 mg Oral Daily  . sodium chloride  3 mL Intravenous Q12H  . warfarin  7.5 mg Oral ONCE-1800  . warfarin  1 each Does not apply Once  . Warfarin - Pharmacist Dosing Inpatient   Does not apply q1800  . zinc sulfate  220 mg Oral Daily   Continuous Infusions: . heparin 1,450 Units/hr (05/27/13 1030)    PHYSICAL EXAM Filed Vitals:   05/26/13 2147 05/27/13 0130 05/27/13 0629 05/27/13 1004  BP: 129/74 95/78 107/77 108/76  Pulse: 114 137 93   Temp: 97.9 F (36.6 C)  98.3 F (36.8 C)   TempSrc: Oral  Oral   Resp: 20  18   Height:      Weight:   238 lb 15.7 oz (108.4 kg)   SpO2: 95%  98%     Well developed and nourished in no acute distress HENT normal Neck supple with JVP-flat Carotids brisk and full without bruits Clear Irregularly irregular rate and rhythm with controlled ventricular response, no murmurs or gallops Abd-soft with active BS without hepatomegaly No Clubbing cyanosis edema Skin-warm and dry A & Oriented  Grossly normal sensory and motor function   TELEMETRY: Reviewed telemetry pt in af wioth intermitten pacing:    Intake/Output Summary (Last 24 hours) at 05/27/13 1040 Last data filed at 05/27/13 0855  Gross per 24 hour  Intake 1367.64 ml  Output   1300 ml  Net  67.64 ml    LABS: Basic Metabolic Panel:  Recent Labs Lab 05/26/13 1129 05/26/13 2032 05/27/13 0525  NA 137  --  134*  K 3.9  --  3.8  CL 96  --  97  CO2 28  --  29  GLUCOSE  136*  --  182*  BUN 50*  --  44*  CREATININE 2.35*  --  1.69*  CALCIUM 9.2  --  8.8  MG  --  1.9  --    Cardiac Enzymes:  Recent Labs  05/26/13 1220 05/26/13 2033 05/26/13 2348  TROPONINI <0.30 <0.30 <0.30   CBC:  Recent Labs Lab 05/26/13 1129 05/27/13 0525  WBC 9.3 7.9  NEUTROABS 6.7  --   HGB 9.7* 8.9*  HCT 31.1* 29.2*  MCV 82.5 82.0  PLT 358 361   PROTIME:  Recent Labs  05/26/13 2032 05/27/13 0525  LABPROT 14.6 14.7  INR 1.16 1.17   Liver Function Tests: No results found for this basename: AST, ALT, ALKPHOS, BILITOT, PROT, ALBUMIN,  in the last 72 hours No results found for this basename: LIPASE, AMYLASE,  in the last 72 hours BNP: BNP (last 3 results)  Recent Labs  04/14/13 1721 05/26/13 1220  PROBNP 1020.0* 2644.0*      ASSESSMENT AND PLAN:  Active Problems:   Biventricular ICD (implantable cardioverter-defibrillator) in place   Cardiomyopathy, ischemic   Acute on chronic systolic CHF (congestive heart failure)   Acute on chronic renal failure   Paroxysmal atrial fibrillation anemia chronic continue amio and hep/coumadin in anticipation of TEEEDCCV on mon Took LMWH previously so maybe can go home on Monday  renal function better euvolemic  Will resume diuretics later  Signed, Sherryl Manges MD  05/27/2013

## 2013-05-27 NOTE — Progress Notes (Signed)
ANTICOAGULATION CONSULT NOTE  Pharmacy Consult for Heparin, Coumadin Indication: atrial fibrillation  Allergies  Allergen Reactions  . Codeine     REACTION: blisters, but able to take hydrocodone    Patient Measurements: Height: 5\' 8"  (172.7 cm) Weight: 239 lb (108.41 kg) (Scale B) IBW/kg (Calculated) : 68.4 Heparin Dosing Weight: 90  Vital Signs: Temp: 97.9 F (36.6 C) (07/11 2147) Temp src: Oral (07/11 2147) BP: 95/78 mmHg (07/12 0130) Pulse Rate: 137 (07/12 0130)  Labs:  Recent Labs  05/26/13 1129 05/26/13 1220 05/26/13 2032 05/26/13 2033 05/26/13 2348 05/27/13 0525  HGB 9.7*  --   --   --   --   --   HCT 31.1*  --   --   --   --   --   PLT 358  --   --   --   --   --   LABPROT  --   --  14.6  --   --  14.7  INR  --   --  1.16  --   --  1.17  HEPARINUNFRC  --   --   --   --   --  0.29*  CREATININE 2.35*  --   --   --   --   --   TROPONINI  --  <0.30  --  <0.30 <0.30  --     Estimated Creatinine Clearance: 33.4 ml/min (by C-G formula based on Cr of 2.35).  Assessment: 72 yo male with AFib for anticoagulation  Goal of Therapy:  INR 2-3, Heparin level 0.3-07 Monitor platelets by anticoagulation protocol: Yes   Plan:  Increase Heparin 1450 units/hr Check heparin level in 8 hours. Coumadin 7.5 mg today   Geannie Risen, PharmD, BCPS

## 2013-05-27 NOTE — Progress Notes (Signed)
ANTICOAGULATION CONSULT NOTE - Follow Up Consult  Pharmacy Consult for Heparin Indication: atrial fibrillation  Allergies  Allergen Reactions  . Codeine     REACTION: blisters, but able to take hydrocodone    Patient Measurements: Height: 5\' 8"  (172.7 cm) Weight: 238 lb 15.7 oz (108.4 kg) IBW/kg (Calculated) : 68.4 Heparin Dosing Weight: 92.4kg  Vital Signs: Temp: 97.8 F (36.6 C) (07/12 1319) Temp src: Oral (07/12 1319) BP: 98/64 mmHg (07/12 1655) Pulse Rate: 102 (07/12 1319)  Labs:  Recent Labs  05/26/13 1129 05/26/13 1220 05/26/13 2032 05/26/13 2033 05/26/13 2348 05/27/13 0525 05/27/13 1715  HGB 9.7*  --   --   --   --  8.9*  --   HCT 31.1*  --   --   --   --  29.2*  --   PLT 358  --   --   --   --  361  --   LABPROT  --   --  14.6  --   --  14.7  --   INR  --   --  1.16  --   --  1.17  --   HEPARINUNFRC  --   --   --   --   --  0.29* 0.33  CREATININE 2.35*  --   --   --   --  1.69*  --   TROPONINI  --  <0.30  --  <0.30 <0.30  --   --     Estimated Creatinine Clearance: 46.5 ml/min (by C-G formula based on Cr of 1.69).   Medications:  Heparin 1450 units/hr   Assessment: 72yom on heparin bridging to Coumadin for Afib. Heparin level (0.33) is now therapeutic but is still at lower end of range - will increase slightly and follow-up AM heparin level. Coumadin 7.5mg  already given for today - will monitor closely with amiodarone started on admission. - H/H trending down, Plts stable - No significant bleeding reported  Goal of Therapy:  Heparin level 0.3-0.7 units/ml Monitor platelets by anticoagulation protocol: Yes   Plan:  1. Increase heparin drip to 1500 units/hr (15 ml/hr) 2. Follow-up AM heparin level, CBC and INR  Cleon Dew [phone] 05/27/2013,7:26 PM

## 2013-05-28 LAB — GLUCOSE, CAPILLARY: Glucose-Capillary: 219 mg/dL — ABNORMAL HIGH (ref 70–99)

## 2013-05-28 LAB — CBC
HCT: 28.9 % — ABNORMAL LOW (ref 39.0–52.0)
Hemoglobin: 9 g/dL — ABNORMAL LOW (ref 13.0–17.0)
MCV: 82.1 fL (ref 78.0–100.0)
RDW: 17.7 % — ABNORMAL HIGH (ref 11.5–15.5)
WBC: 7.5 10*3/uL (ref 4.0–10.5)

## 2013-05-28 LAB — URINE MICROSCOPIC-ADD ON

## 2013-05-28 LAB — BASIC METABOLIC PANEL
CO2: 28 mEq/L (ref 19–32)
Calcium: 8.9 mg/dL (ref 8.4–10.5)
Creatinine, Ser: 1.69 mg/dL — ABNORMAL HIGH (ref 0.50–1.35)
Glucose, Bld: 217 mg/dL — ABNORMAL HIGH (ref 70–99)
Sodium: 132 mEq/L — ABNORMAL LOW (ref 135–145)

## 2013-05-28 LAB — URINALYSIS, ROUTINE W REFLEX MICROSCOPIC
Hgb urine dipstick: NEGATIVE
Leukocytes, UA: NEGATIVE
Protein, ur: 30 mg/dL — AB
Urobilinogen, UA: 0.2 mg/dL (ref 0.0–1.0)

## 2013-05-28 MED ORDER — WARFARIN SODIUM 10 MG PO TABS
10.0000 mg | ORAL_TABLET | Freq: Once | ORAL | Status: AC
  Administered 2013-05-28: 10 mg via ORAL
  Filled 2013-05-28: qty 1

## 2013-05-28 NOTE — Progress Notes (Signed)
ANTICOAGULATION CONSULT NOTE - Follow Up Consult  Pharmacy Consult for Heparin/coumadin Indication: atrial fibrillation  Allergies  Allergen Reactions  . Codeine     REACTION: blisters, but able to take hydrocodone    Patient Measurements: Height: 5\' 8"  (172.7 cm) Weight: 241 lb 3.2 oz (109.408 kg) (scale b) IBW/kg (Calculated) : 68.4 Heparin Dosing Weight: 92.4kg  Vital Signs: Temp: 98.1 F (36.7 C) (07/13 0548) Temp src: Oral (07/13 0548) BP: 120/58 mmHg (07/13 0548) Pulse Rate: 64 (07/13 0548)  Labs:  Recent Labs  05/26/13 1129 05/26/13 1220 05/26/13 2032 05/26/13 2033 05/26/13 2348 05/27/13 0525 05/27/13 1715 05/28/13 0510  HGB 9.7*  --   --   --   --  8.9*  --  9.0*  HCT 31.1*  --   --   --   --  29.2*  --  28.9*  PLT 358  --   --   --   --  361  --  322  LABPROT  --   --  14.6  --   --  14.7  --  14.9  INR  --   --  1.16  --   --  1.17  --  1.20  HEPARINUNFRC  --   --   --   --   --  0.29* 0.33 0.37  CREATININE 2.35*  --   --   --   --  1.69*  --  1.69*  TROPONINI  --  <0.30  --  <0.30 <0.30  --   --   --     Estimated Creatinine Clearance: 46.7 ml/min (by C-G formula based on Cr of 1.69).   Medications:  Heparin 1500 units/hr   Assessment: 72yom on heparin bridging to Coumadin for Afib. Heparin level (0.3) is now therapeutic. INR 1.2 with slight trend upwarf, will increase Coumadin today - will monitor closely with amiodarone started on admission. - H/H stable, Plts stable - No significant bleeding reported  Goal of Therapy:  Heparin level 0.3-0.7 units/ml INR goal 2-3 Monitor platelets by anticoagulation protocol: Yes   Plan:  1. Continue heparin drip at 1500 units/hr (15 ml/hr) 2. Follow-up AM heparin level, CBC and INR  Sheppard Coil PharmD., BCPS Clinical Pharmacist Pager [phone] 05/28/2013 8:33 AM

## 2013-05-28 NOTE — Progress Notes (Signed)
Patient Name: [name]      SUBJECTIVE: Admitted with congestive heart failure. He has a history of ischemic cardiomyopathy with multiple prior PCI as well as prior CRT. His history of PAF with multiple TIAs and a history of a cerebral aneurysm and is treated with aspirin/Plavix. Most recently he had a non-STEMI 5/14 at which time he had restenosis of his RCA for which a DES was placed. He is a borderline LAD lesion-FFR 0.88. Ejection fraction is 35-40% He's had recent symptoms of increasing shortness of breath somewhat responsive to diuretics. He also noted an irregular heart rate and was found in the emergency room to be in atrial fibrillation  he was started on amiodarone and Coumadin with the plan to transition blood thinners from aspirin/Plavix to Coumadin/Plavix. He is also set for TE cardioversion  Nausea resolved ; no cp or sob    His hx of TIAs interesting "multiple times in a day" Dr Sandria Manly Rx with coumadin with full resolution  Past Medical History  Diagnosis Date  . Type 2 diabetes mellitus   . Coronary atherosclerosis of native coronary artery     a. DES to RCA 03/2009. b. s/p PTCA to RCA for ISR, 05/2010. c. 03/2013 NSTEMI 2/2 severe prox RCA s/p PTCa/DES, med rx for residual dz.  . Hyperlipidemia   . TIA (transient ischemic attack)     Multiple TIAs  . Cardiomyopathy, ischemic     a. LVEF 25-35%.  . Morbid obesity   . Mitral regurgitation     Mild  . BPH (benign prostatic hypertrophy)   . LBBB (left bundle branch block)     s/p BiV ICD Implanted by Dr Graciela Husbands  . Paroxysmal atrial fibrillation     a. Coumadin discontinued in 2010 when the patient required ASA/Plavix for stenting.  . Chronic kidney disease, stage III (moderate)     a. Followed by Dr. Fausto Skillern.  . HTN (hypertension)   . Anemia-chronic     a. Pt reports h/o anemia - was offered IV iron in past by nephrologist but declined and has taken PO instead.  . Aneurysm, cerebral   . MRSA (methicillin resistant  Staphylococcus aureus)   . Anemia   . Pulmonary nodule     CXR, 03/2013, [facility]    Scheduled Meds:  Scheduled Meds: . amiodarone  400 mg Oral BID  . aspirin  81 mg Oral Daily  . atorvastatin  80 mg Oral q1800  . carvedilol  12.5 mg Oral BID  . clopidogrel  75 mg Oral Daily  . ferrous sulfate  325 mg Oral BID WC  . hydrALAZINE  25 mg Oral TID  . insulin aspart  0-15 Units Subcutaneous TID WC  . isosorbide mononitrate  30 mg Oral Daily  . l-methylfolate-B6-B12  1 tablet Oral QODAY  . linagliptin  5 mg Oral Daily  . sodium chloride  3 mL Intravenous Q12H  . warfarin  10 mg Oral ONCE-1800  . Warfarin - Pharmacist Dosing Inpatient   Does not apply q1800  . zinc sulfate  220 mg Oral Daily   Continuous Infusions: . heparin 1,500 Units/hr (05/28/13 0600)    PHYSICAL EXAM Filed Vitals:   05/27/13 2240 05/28/13 0150 05/28/13 0548 05/28/13 0938  BP: 124/54 132/64 120/58 113/65  Pulse: 70 68 64   Temp:   98.1 F (36.7 C)   TempSrc:   Oral   Resp:   18   Height:      Weight:  241 lb 3.2 oz (109.408 kg)   SpO2:   96%     Well developed and nourished in no acute distress HENT normal Neck supple with JVP-flat Carotids brisk and full without bruits Clear Irregularly irregular rate and rhythm with controlled ventricular response, no murmurs or gallops Abd-soft with active BS without hepatomegaly No Clubbing cyanosis edema Skin-warm and dry A & Oriented  Grossly normal sensory and motor function   TELEMETRY: Reviewed telemetry pt in af wioth intermittent pacing:    Intake/Output Summary (Last 24 hours) at 05/28/13 1010 Last data filed at 05/28/13 0833  Gross per 24 hour  Intake 1198.07 ml  Output    800 ml  Net 398.07 ml    LABS: Basic Metabolic Panel:  Recent Labs Lab 05/26/13 1129 05/26/13 2032 05/27/13 0525 05/28/13 0510  NA 137  --  134* 132*  K 3.9  --  3.8 4.0  CL 96  --  97 97  CO2 28  --  29 28  GLUCOSE 136*  --  182* 217*  BUN 50*  --  44* 44*    CREATININE 2.35*  --  1.69* 1.69*  CALCIUM 9.2  --  8.8 8.9  MG  --  1.9  --   --    Cardiac Enzymes:  Recent Labs  05/26/13 1220 05/26/13 2033 05/26/13 2348  TROPONINI <0.30 <0.30 <0.30   CBC:  Recent Labs Lab 05/26/13 1129 05/27/13 0525 05/28/13 0510  WBC 9.3 7.9 7.5  NEUTROABS 6.7  --   --   HGB 9.7* 8.9* 9.0*  HCT 31.1* 29.2* 28.9*  MCV 82.5 82.0 82.1  PLT 358 361 322   PROTIME:  Recent Labs  05/26/13 2032 05/27/13 0525 05/28/13 0510  LABPROT 14.6 14.7 14.9  INR 1.16 1.17 1.20   Liver Function Tests: No results found for this basename: AST, ALT, ALKPHOS, BILITOT, PROT, ALBUMIN,  in the last 72 hours No results found for this basename: LIPASE, AMYLASE,  in the last 72 hours BNP: BNP (last 3 results)  Recent Labs  04/14/13 1721 05/26/13 1220  PROBNP 1020.0* 2644.0*      ASSESSMENT AND PLAN:  Active Problems:   SYSTOLIC HEART FAILURE, CHRONIC   Biventricular ICD (implantable cardioverter-defibrillator) in place   Cardiomyopathy, ischemic   Acute on chronic renal failure   Paroxysmal atrial fibrillation anemia chronic Improved renal function  continue amio and hep/coumadin in anticipation of TEEEDCCV on mon Took LMWH previously so maybe can go home on Monday  euvolemic  Will resume diuretics later Need to consider av ablation later if continues with afib  Signed, Sherryl Manges MD  05/28/2013

## 2013-05-28 NOTE — Progress Notes (Signed)
Pharmacist Heart Failure Core Measure Documentation  Assessment: [name] has an EF documented as 35% on 04/15/13 by echo.  Rationale: Heart failure patients with left ventricular systolic dysfunction (LVSD) and an EF < 40% should be prescribed an angiotensin converting enzyme inhibitor (ACEI) or angiotensin receptor blocker (ARB) at discharge unless a contraindication is documented in the medical record.  This patient is not currently on an ACEI or ARB for HF.  This note is being placed in the record in order to provide documentation that a contraindication to the use of these agents is present for this encounter.  ACE Inhibitor or Angiotensin Receptor Blocker is contraindicated (specify all that apply)  []   ACEI allergy AND ARB allergy []   Angioedema []   Moderate or severe aortic stenosis []   Hyperkalemia []   Hypotension []   Renal artery stenosis [x]   Worsening renal function, preexisting renal disease or dysfunction   Severiano Gilbert 05/28/2013 8:30 AM

## 2013-05-28 NOTE — Progress Notes (Signed)
Denies any pain.  IV site unremarkable.  Respiratory exchange even and unlabored.

## 2013-05-29 ENCOUNTER — Encounter (HOSPITAL_COMMUNITY): Payer: Self-pay | Admitting: *Deleted

## 2013-05-29 LAB — BASIC METABOLIC PANEL
BUN: 35 mg/dL — ABNORMAL HIGH (ref 6–23)
CO2: 24 mEq/L (ref 19–32)
Chloride: 98 mEq/L (ref 96–112)
GFR calc non Af Amer: 44 mL/min — ABNORMAL LOW (ref >90)
Glucose, Bld: 197 mg/dL — ABNORMAL HIGH (ref 70–99)
Potassium: 4.2 mEq/L (ref 3.5–5.1)

## 2013-05-29 LAB — CBC
HCT: 27.7 % — ABNORMAL LOW (ref 39.0–52.0)
Hemoglobin: 8.3 g/dL — ABNORMAL LOW (ref 13.0–17.0)
MCH: 24.8 pg — ABNORMAL LOW (ref 26.0–34.0)
MCV: 82.7 fL (ref 78.0–100.0)
RBC: 3.35 MIL/uL — ABNORMAL LOW (ref 4.22–5.81)
WBC: 7.8 10*3/uL (ref 4.0–10.5)

## 2013-05-29 MED ORDER — TORSEMIDE 10 MG PO TABS: 10.0000 mg | ORAL_TABLET | Freq: Every day | ORAL | Status: DC

## 2013-05-29 MED ORDER — WARFARIN SODIUM 5 MG PO TABS: ORAL_TABLET | ORAL | Status: DC

## 2013-05-29 MED ORDER — ATORVASTATIN CALCIUM 80 MG PO TABS: 80.0000 mg | ORAL_TABLET | Freq: Every day | ORAL | Status: DC

## 2013-05-29 MED ORDER — AMIODARONE HCL 400 MG PO TABS: 400.0000 mg | ORAL_TABLET | Freq: Two times a day (BID) | ORAL | Status: DC

## 2013-05-29 MED ORDER — ENOXAPARIN SODIUM 120 MG/0.8ML ~~LOC~~ SOLN
110.0000 mg | Freq: Two times a day (BID) | SUBCUTANEOUS | Status: DC
  Administered 2013-05-29: 110 mg via SUBCUTANEOUS
  Filled 2013-05-29 (×3): qty 0.8

## 2013-05-29 MED ORDER — POTASSIUM CHLORIDE ER 10 MEQ PO TBCR: 5.0000 meq | EXTENDED_RELEASE_TABLET | Freq: Every day | ORAL | Status: DC

## 2013-05-29 MED ORDER — ENOXAPARIN SODIUM 120 MG/0.8ML ~~LOC~~ SOLN: 110.0000 mg | Freq: Two times a day (BID) | SUBCUTANEOUS | Status: DC

## 2013-05-29 NOTE — Progress Notes (Signed)
Inpatient Diabetes Program Recommendations  AACE/ADA: New Consensus Statement on Inpatient Glycemic Control (2013)  Target Ranges:  Prepandial:   less than 140 mg/dL      Peak postprandial:   less than 180 mg/dL (1-2 hours)      Critically ill patients:  140 - 180 mg/dL  Results for [name], [name] (MRN [id number]) as of 05/29/2013 10:39  Ref. Range 05/28/2013 06:58 05/28/2013 11:09 05/28/2013 16:29 05/28/2013 21:52 05/29/2013 07:28  Glucose-Capillary Latest Range: 70-99 mg/dL 401 (H) 027 (H) 253 (H) 219 (H) 207 (H)   Note patient was on 30 units Humalog 75/25 tid prior to admit.    Consider adding Lantus 20 units daily with Novolog 4 units tid with meals while in the hospital.

## 2013-05-29 NOTE — Progress Notes (Signed)
Patient ID: [name], male   DOB: May 27, 1941, 71 y.o.   MRN: [id number]    Patient Name: [name]      SUBJECTIVE: No SOB wants to go home has been on lovenox in past    His hx of TIAs interesting "multiple times in a day" Dr Sandria Manly Rx with coumadin with full resolution  Past Medical History  Diagnosis Date  . Type 2 diabetes mellitus   . Coronary atherosclerosis of native coronary artery     a. DES to RCA 03/2009. b. s/p PTCA to RCA for ISR, 05/2010. c. 03/2013 NSTEMI 2/2 severe prox RCA s/p PTCa/DES, med rx for residual dz.  . Hyperlipidemia   . TIA (transient ischemic attack)     Multiple TIAs  . Cardiomyopathy, ischemic     a. LVEF 25-35%.  . Morbid obesity   . Mitral regurgitation     Mild  . BPH (benign prostatic hypertrophy)   . LBBB (left bundle branch block)     s/p BiV ICD Implanted by Dr Graciela Husbands  . Paroxysmal atrial fibrillation     a. Coumadin discontinued in 2010 when the patient required ASA/Plavix for stenting.  . Chronic kidney disease, stage III (moderate)     a. Followed by Dr. Fausto Skillern.  . HTN (hypertension)   . Anemia-chronic     a. Pt reports h/o anemia - was offered IV iron in past by nephrologist but declined and has taken PO instead.  . Aneurysm, cerebral   . MRSA (methicillin resistant Staphylococcus aureus)   . Anemia   . Pulmonary nodule     CXR, 03/2013, [facility]    Scheduled Meds:  Scheduled Meds: . amiodarone  400 mg Oral BID  . aspirin  81 mg Oral Daily  . atorvastatin  80 mg Oral q1800  . carvedilol  12.5 mg Oral BID  . clopidogrel  75 mg Oral Daily  . ferrous sulfate  325 mg Oral BID WC  . hydrALAZINE  25 mg Oral TID  . insulin aspart  0-15 Units Subcutaneous TID WC  . isosorbide mononitrate  30 mg Oral Daily  . l-methylfolate-B6-B12  1 tablet Oral QODAY  . linagliptin  5 mg Oral Daily  . sodium chloride  3 mL Intravenous Q12H  . Warfarin - Pharmacist Dosing Inpatient   Does not apply q1800  . zinc sulfate  220 mg Oral Daily    Continuous Infusions: . heparin 1,500 Units/hr (05/29/13 0100)    PHYSICAL EXAM Filed Vitals:   05/28/13 0938 05/28/13 1300 05/28/13 1957 05/29/13 0523  BP: 113/65 121/54 114/82 131/56  Pulse:  70 75 66  Temp:  97.8 F (36.6 C) 97.9 F (36.6 C) 98.4 F (36.9 C)  TempSrc:  Oral Oral Oral  Resp:  18 18 20   Height:      Weight:    242 lb 11.6 oz (110.1 kg)  SpO2:  99% 100% 98%    Well developed and nourished in no acute distress HENT normal Neck supple with JVP-flat Carotids brisk and full without bruits Clear Irregularly irregular rate and rhythm with controlled ventricular response, no murmurs or gallops Abd-soft with active BS without hepatomegaly No Clubbing cyanosis edema Skin-warm and dry A & Oriented  Grossly normal sensory and motor function   TELEMETRY: P synch pacing 05/29/2013     Intake/Output Summary (Last 24 hours) at 05/29/13 0753 Last data filed at 05/29/13 0733  Gross per 24 hour  Intake   2090 ml  Output  400 ml  Net   1690 ml    LABS: Basic Metabolic Panel:  Recent Labs Lab 05/26/13 1129 05/26/13 2032 05/27/13 0525 05/28/13 0510 05/29/13 0608  NA 137  --  134* 132* 132*  K 3.9  --  3.8 4.0 4.2  CL 96  --  97 97 98  CO2 28  --  29 28 24   GLUCOSE 136*  --  182* 217* 197*  BUN 50*  --  44* 44* 35*  CREATININE 2.35*  --  1.69* 1.69* 1.52*  CALCIUM 9.2  --  8.8 8.9 8.6  MG  --  1.9  --   --   --    Cardiac Enzymes:  Recent Labs  05/26/13 1220 05/26/13 2033 05/26/13 2348  TROPONINI <0.30 <0.30 <0.30   CBC:  Recent Labs Lab 05/26/13 1129 05/27/13 0525 05/28/13 0510 05/29/13 0608  WBC 9.3 7.9 7.5 7.8  NEUTROABS 6.7  --   --   --   HGB 9.7* 8.9* 9.0* 8.3*  HCT 31.1* 29.2* 28.9* 27.7*  MCV 82.5 82.0 82.1 82.7  PLT 358 361 322 290   PROTIME:  Recent Labs  05/27/13 0525 05/28/13 0510 05/29/13 0608  LABPROT 14.7 14.9 16.6*  INR 1.17 1.20 1.38   BNP: BNP (last 3 results)  Recent Labs  04/14/13 1721  05/26/13 1220  PROBNP 1020.0* 2644.0*      ASSESSMENT AND PLAN:  Active Problems:   SYSTOLIC HEART FAILURE, CHRONIC   Biventricular ICD (implantable cardioverter-defibrillator) in place   Cardiomyopathy, ischemic   Acute on chronic renal failure   Paroxysmal atrial fibrillation anemia chronic Improved renal function  D/C with home lovenox and d/c aspirin  Took LMWH previously so maybe can go home on Monday  euvolemic  Will resume diuretics   Need to consider av ablation later if continues with afib despite amiodarone  Leta Jungling from [place] will interogate pacer D/C with demedex 10 mg daily instead of 20 bid  SignedCharlton Haws MD  05/29/2013

## 2013-05-29 NOTE — Progress Notes (Signed)
ANTICOAGULATION CONSULT NOTE - Follow Up Consult  Pharmacy Consult for Lovenox/Coumadin Indication: atrial fibrillation  Allergies  Allergen Reactions  . Codeine     REACTION: blisters, but able to take hydrocodone   Labs:  Recent Labs  05/26/13 1220  05/26/13 2033 05/26/13 2348  05/27/13 0525 05/27/13 1715 05/28/13 0510 05/29/13 0608  HGB  --   --   --   --   --  8.9*  --  9.0* 8.3*  HCT  --   --   --   --   --  29.2*  --  28.9* 27.7*  PLT  --   --   --   --   --  361  --  322 290  LABPROT  --   < >  --   --   --  14.7  --  14.9 16.6*  INR  --   < >  --   --   --  1.17  --  1.20 1.38  HEPARINUNFRC  --   --   --   --   < > 0.29* 0.33 0.37 0.33  CREATININE  --   --   --   --   --  1.69*  --  1.69* 1.52*  TROPONINI <0.30  --  <0.30 <0.30  --   --   --   --   --   < > = values in this interval not displayed.  Estimated Creatinine Clearance: 52.1 ml/min (by C-G formula based on Cr of 1.52).  Assessment: 72yom on Lovenox bridging to Coumadin for Afib.  INR = 1.38 - No significant bleeding reported  Goal of Therapy:  Appropriate Lovenox dosing INR goal 2-3 Monitor platelets by anticoagulation protocol: Yes   Plan:  1) Lovenox 110 mg sq Q 12 hours 2) Repeat Coumadin 10 mg po x 1 3) Follow up AM labs if not discharged  Thank you. Okey Regal, PharmD [phone]  05/29/2013 8:25 AM

## 2013-05-29 NOTE — Progress Notes (Signed)
Pt being d/c to home with wife, pt given dc instructions, follow up appointments, and medications reviewed, pt and wife verbalized understanding, pt stable, pt leaving wheelchair

## 2013-05-29 NOTE — Discharge Summary (Signed)
Discharge Summary   Patient ID: SAYGE SALVATO,  MRN: [id number], DOB/AGE: 1941-05-15 72 y.o.  Admit date: 05/26/2013 Discharge date: 05/29/2013  Primary Physician: [name], MD Primary Cardiologist: Ival Bible, MD  Discharge Diagnoses Principal Problem:   Paroxysmal atrial fibrillation Active Problems:   CAD, NATIVE VESSEL   SYSTOLIC HEART FAILURE, CHRONIC   Cardiomyopathy, ischemic   DM   HYPERLIPIDEMIA-MIXED   LBBB   Biventricular ICD (implantable cardioverter-defibrillator) in place   Acute on chronic kidney disease, stage 3   OVERWEIGHT/OBESITY   Pulmonary nodule   Allergies Allergies  Allergen Reactions  . Codeine     REACTION: blisters, but able to take hydrocodone    Diagnostic Studies/Procedures  PA/LATERAL CHEST X-RAY - 05/26/13  IMPRESSION:  Cardiomegaly. Question minimal pulmonary vascular congestion. Defibrillator leads are noted.  History of Present Illness ANTWION CARPENTER is a 72 y.o. male who was admitted to  [facility] on 05/26/13 with the above problem list.   He is a 72 y.o. Caucasian male with a complex cardiac and past medical history. He has a history s/p multiple PCIs, ischemic cardiomyopathy (EF 35-40% 03/2013) s/p [employer] CRT-D, PAF (on ASA/Plavix for CAD), CKD (stage III, baseline Cr 1.1-1.2, followed by Dr. Fausto Skillern), h/o multiple TIAs, cerebral aneurysm, morbid obesity, LBBB, DM2, HLD, iron deficiency anemia and recently discovered pulmonary nodule  The patient has received DES-RCA in 03/2009. He underwent subsequent PTCA to RCA for ISR in 05/2010. He was admitted to [facility] 04/14/13 for NSTEMI. He had complained of exertional chest pain, 30 lbs weight gain and DOE. He again underwent cardiac catheterization revealing 99% prox RCA stenosis s/p DES. He has been continued on lifetime ASA/Plavix. Note, the patient has had multiple occurrences of RCA stenosis and restenosis. It is a heavily calcified vessel. He had a borderline 70% LAD  lesion (FFR 0.88 in 2011), 70% PDA lesion both unchanged from prior cath. Recommendations for bypass grafting have been recommended should RCA restenosis develop or LAD lesions progress. 2D echo 04/15/13: EF 35-40%, moderate LVH, grade 1 diastolic dysfunction, mild LA dilatation. ICD interrogation revealed no evidence of a-fib. Cr 1.27 on discharge.   He presented to the ED 6/27 for a UTI and was treated with antibiotics. Cr 1.52. He was advised to increase fluid intake. There are telephone notes 7/2 and 7/7 in which the patient c/o increased HR at 111 and DOE. He followed up in the office with Gene Serpe, PA-C on 7/3. He c/o 4 lbs weight gain, peripheral edema and worsening DOE. Denied chest pain. Weight 247 lbs. Lasix was switched with Demadex. He was advised to restrict salt/fluid intake, elevate his legs and weigh himself daily. Unable to find EKG or interpretation at this time.   His weight continued to decline to as low as 238-239 lbs the date of admission. He had noticed improved orthopnea, but endorsed increased DOE walking 100-150 feet with associated substernal chest pressure c/w his prior MI. At baseline, he is able to perform yardwork without incident. He denied LE edema, PND, palpitations or syncope. He had maintained a strict regimen of fluid/salt restriction. He had completed a 10 day course of Keflex for persistent UTI. He reported adhering to ASA/Plavix.   In the ED, EKG revealed atrial fibrillation with underlying LBBB with possible NSVT. Initial trop-I WNL. pBNP 2644. BMET- BUN 50, Cr 2.35. CBC- Hgb 9.7/Hct 31.1 (baseline). CXR- cardiomegaly, question minimal pulmonary vascular congestion. The patient was examined and deemed to be euvolemic despite elevation in  BNP. His symptoms were, rather, attributed to evidence of atrial fibrillation. In the emergency department, the patient's device was interrogated revealing 22 episodes of atrial tachycardia/atrial fibrillation since 04/18/13 with the  most recent episode occurring the day of admission. Additional observations included duration of atrial tachyarrhythmias greater 6 hours for 14 days with ventricular rates good at 100 beats per minute during these times. Additionally, possible intrathoracic fluid accumulation was detected on optimal data. Underlying rhythm indicated atrial arrhythmia with Bi-V pacing. The decision was made to admit the patient for overnight observation and formal rule out.   Hospital Course   He was loaded with by mouth amiodarone in the emergency department. He was started on heparin for anticoagulation. There were several complicated factors in determining the appropriate anticoagulant for this patient. He has a history of iron deficiency anemia, CK-MB, cerebral aneurysm and history of multiple TIAs. Given his increased bleeding risk, the benefit of anticoagulation with objective evidence of atrial fibrillation and high CHADSVASc score (5) was felt to outweigh the bleeding risk. The decision was made to bridge to Coumadin with an and to keep INR at a low therapeutic range.  Two subsequent sets of troponin returned within normal limits. The patient's outpatient diuretic regimen was suspended. His renal function did show evidence of improvement (creatinine 1.69 on 7/13 -1.52 today). Given the patient's recent UTI urinalysis was obtained which indicated no evidence of infection. The patient's INR remained subtherapeutic over the weekend, however he expressed a strong preference to be discharged today with outpatient Lovenox bridging to Coumadin. The patient has previously been on Coumadin and it is well educated on self administering Lovenox on an outpatient basis.  He was deemed stable for discharge by Dr. Eden Emms. He will be discharged on amiodarone load for one to 3 weeks. This specific time frame will be determined on followup. Outpatient torsemide will be reduced. Daily aspirin has been discontinued to reduce the risk of  bleeding. He will be discharged on Lovenox/Coumadin and resume Plavix given significant CAD. CBC for platelet monitoring will be checked on 7/17 per pharmacy recommendations. Coumadin will be checked in the [facility] office tomorrow. Coumadin adjustment has been made for this evening and further recommendations will be made tomorrow. INR 1.38 today. He will follow-up in the [place] office with Serpe, Gene, PA-C as previously scheduled. LFTs and TFTs can be checked in approximately 6 weeks post amiodarone initiation. This can be arranged on followup. This information, including supplemental atrial fibrillation education, has been clearly outlined in the discharge AVS.   Discharge Vitals:  Blood pressure 131/56, pulse 66, temperature 98.4 F (36.9 C), temperature source Oral, resp. rate 20, height 5\' 8"  (1.727 m), weight 110.1 kg (242 lb 11.6 oz), SpO2 98.00%.   Weight change: 0.692 kg (1 lb 8.4 oz)  Labs: Recent Labs     05/28/13  0510  05/29/13  0608  WBC  7.5  7.8  HGB  9.0*  8.3*  HCT  28.9*  27.7*  MCV  82.1  82.7  PLT  322  290   Recent Labs Lab 05/27/13 0525 05/28/13 0510 05/29/13 0608  NA 134* 132* 132*  K 3.8 4.0 4.2  CL 97 97 98  CO2 29 28 24   BUN 44* 44* 35*  CREATININE 1.69* 1.69* 1.52*  CALCIUM 8.8 8.9 8.6  GLUCOSE 182* 217* 197*   Recent Labs     05/26/13  1220  05/26/13  2033  05/26/13  2348  TROPONINI  <0.30  <0.30  <  0.30   Disposition:  Discharge Orders   Future Appointments Provider Department Dept Phone   05/30/2013 10:00 AM [facility] (near [place]) [phone]   06/01/2013 11:30 AM Prescott Parma, PA-C [facility] (near [place]) [phone]   Future Orders Complete By Expires     Diet - low sodium heart healthy  As directed     Increase activity slowly  As directed           Follow-up Information   Follow up with [facility] On 05/30/2013. (At 10:00 AM for Coumadin clinic follow-up. )    Contact  information:   [address] [place] [address]       Follow up with Prescott Parma, PA-C On 06/01/2013. (At 11:30 AM as previously scheduled and for post-hospital follow-up. )    Contact information:   [address] [place] [address] [phone]       Discharge Medications:    Medication List    STOP taking these medications       aspirin 325 MG tablet     cephALEXin 500 MG capsule  Commonly known as:  KEFLEX      TAKE these medications       acetaminophen 650 MG CR tablet  Commonly known as:  TYLENOL  Take 2 by mouth twice daily     amiodarone 400 MG tablet  Commonly known as:  PACERONE  Take 1 tablet (400 mg total) by mouth 2 (two) times daily.     atorvastatin 80 MG tablet  Commonly known as:  LIPITOR  Take 1 tablet (80 mg total) by mouth daily at 6 PM.     carvedilol 12.5 MG tablet  Commonly known as:  COREG  Take 1 tablet (12.5 mg total) by mouth 2 (two) times daily.     clopidogrel 75 MG tablet  Commonly known as:  PLAVIX  Take 1 tablet (75 mg total) by mouth daily.     enoxaparin 120 MG/0.8ML injection  Commonly known as:  LOVENOX  Inject 0.73 mLs (110 mg total) into the skin every 12 (twelve) hours.     ferrous sulfate 325 (65 FE) MG tablet  Take 325 mg by mouth 2 (two) times daily with a meal.     hydrALAZINE 25 MG tablet  Commonly known as:  APRESOLINE  Take 1 tablet (25 mg total) by mouth 3 (three) times daily.     HYDROcodone-acetaminophen 10-325 MG per tablet  Commonly known as:  NORCO  Take 1 tablet by mouth every 8 (eight) hours as needed.     insulin lispro protamine-lispro (75-25) 100 UNIT/ML Susp  Commonly known as:  HUMALOG 75/25  Inject 30-35 Units into the skin 3 (three) times daily after meals.     isosorbide mononitrate 30 MG 24 hr tablet  Commonly known as:  IMDUR  Take 30 mg by mouth daily.     linagliptin 5 MG Tabs tablet  Commonly known as:  TRADJENTA  Take 5 mg by mouth daily.     NEURPATH-B  3-35-2 MG Tabs  Take 1 tablet by mouth 2 (two) times daily.     l-methylfolate-B6-B12 3-35-2 MG Tabs  Commonly known as:  METANX  Take 1 tablet by mouth every other day.     nitroGLYCERIN 0.4 MG SL tablet  Commonly known as:  NITROSTAT  Place 0.4 mg under the tongue every 5 (five) minutes as needed for chest pain. For  chest pain     potassium chloride 10 MEQ tablet  Commonly known as:  K-DUR  Take 0.5 tablets (5 mEq total) by mouth daily. With Demadex.     pyridOXINE 50 MG tablet  Commonly known as:  B-6  Take 50 mg by mouth daily.     torsemide 10 MG tablet  Commonly known as:  DEMADEX  Take 1 tablet (10 mg total) by mouth daily.     warfarin 5 MG tablet  Commonly known as:  COUMADIN  Take 10mg  (two tablets) this evening (05/29/13), then 7.5mg  (one and one half tablets) 7/15 until further recommendations by Coumadin clinic.     Zinc 50 MG Caps  Take 1 capsule by mouth daily.       Outstanding Labs/Studies: INR tomorrow, CBC on 7/17 to monitor PLT with Lovenox  Duration of Discharge Encounter: Greater than 30 minutes including physician time.  Signed, R. Hurman Horn, PA-C 05/29/2013, 10:50 AM

## 2013-05-30 ENCOUNTER — Ambulatory Visit (INDEPENDENT_AMBULATORY_CARE_PROVIDER_SITE_OTHER): Payer: Medicare Other | Admitting: *Deleted

## 2013-05-30 DIAGNOSIS — I4891 Unspecified atrial fibrillation: Secondary | ICD-10-CM

## 2013-05-30 DIAGNOSIS — I480 Paroxysmal atrial fibrillation: Secondary | ICD-10-CM

## 2013-05-30 DIAGNOSIS — G459 Transient cerebral ischemic attack, unspecified: Secondary | ICD-10-CM

## 2013-05-30 DIAGNOSIS — Z7901 Long term (current) use of anticoagulants: Secondary | ICD-10-CM

## 2013-05-30 LAB — POCT INR: INR: 2

## 2013-05-30 NOTE — Patient Instructions (Signed)
In [facility] 7/11 - 7/14 for PAF.  Has H/O TIA  D/C INR 1.38  Pt took 10mg  last night   Pt was to be bridged with lovenox .  [facility] did not have Lovenox in stock and had to order it.  Pt was to pick up today (has missed 2 doses) but INR was 2.0   Told pt he did not need Lovenox since INR 2.0.   Was started on Amiodarone 400mg  BID in hosp.

## 2013-06-01 ENCOUNTER — Ambulatory Visit (INDEPENDENT_AMBULATORY_CARE_PROVIDER_SITE_OTHER): Payer: Medicare Other | Admitting: Physician Assistant

## 2013-06-01 ENCOUNTER — Encounter: Payer: Self-pay | Admitting: Physician Assistant

## 2013-06-01 ENCOUNTER — Other Ambulatory Visit: Payer: Self-pay | Admitting: *Deleted

## 2013-06-01 ENCOUNTER — Ambulatory Visit (INDEPENDENT_AMBULATORY_CARE_PROVIDER_SITE_OTHER): Payer: Medicare Other | Admitting: *Deleted

## 2013-06-01 VITALS — BP 124/79 | HR 60 | Ht 68.0 in | Wt 243.0 lb

## 2013-06-01 DIAGNOSIS — I4891 Unspecified atrial fibrillation: Secondary | ICD-10-CM

## 2013-06-01 DIAGNOSIS — G459 Transient cerebral ischemic attack, unspecified: Secondary | ICD-10-CM

## 2013-06-01 DIAGNOSIS — I480 Paroxysmal atrial fibrillation: Secondary | ICD-10-CM

## 2013-06-01 DIAGNOSIS — Z9229 Personal history of other drug therapy: Secondary | ICD-10-CM

## 2013-06-01 DIAGNOSIS — I5022 Chronic systolic (congestive) heart failure: Secondary | ICD-10-CM

## 2013-06-01 DIAGNOSIS — I10 Essential (primary) hypertension: Secondary | ICD-10-CM

## 2013-06-01 DIAGNOSIS — Z7901 Long term (current) use of anticoagulants: Secondary | ICD-10-CM

## 2013-06-01 MED ORDER — AMIODARONE HCL 400 MG PO TABS: ORAL_TABLET | ORAL | Status: DC

## 2013-06-01 NOTE — Assessment & Plan Note (Signed)
Essentially euvolemic by history and exam. Of note, Demadex dose recently cut in half at discharge. Patient does have CKD, and we will check followup labs, including BNP level.

## 2013-06-01 NOTE — Patient Instructions (Addendum)
Your physician recommends that you go to the [facility] lab for blood work - CMET, TSH, BNP  - today Your physician has recommended that you have a pulmonary function test.  Pulmonary Function Tests are a group of tests that measure how well air moves in and out of your lungs.  This test will be scheduled at [facility] respiratory department.  Your physician has recommended you make the following change in your medication:  Decrease Amiodarone to 200mg  twice a day x 3 weeks, then 200mg  daily (already has med at home) Continue all other current medications. Follow up in  1 month

## 2013-06-01 NOTE — Progress Notes (Signed)
CBC requested at hospital d/c 05/26/13

## 2013-06-01 NOTE — Assessment & Plan Note (Signed)
In retrospect, I suspect that the patient's recent complaint of DOE was secondary to recurrent PAF. This is based on his report today that he feels much better now that he is back in NSR, notably with much less DOE and with overall increased exercise tolerance. Recommendation is to decrease amiodarone to 200 mg twice a day of (x3 weeks), then down to 200 mg daily. Will check a complete metabolic profile, given recent mildly elevated LFTs, and we'll also check a TSH level. We'll order baseline PFTs with DLCO. He has since established here in our Coumadin clinic, and had a recent INR of 2.0. As previously noted, he was taken off ASA, but with recommendation to continue on Plavix, given his significant CAD. Will defer to Dr. Diona Browner as to whether or not he needs to remain on Plavix, indefinitely. Regarding anticoagulation, my recommendation is that the patient remain on Coumadin indefinitely, given his history of prior TIAs. Of note, he reports today that he previously had been on Coumadin for 10 years, but was taken off it in order to be treated with ASA/Plavix, following stenting in 2010.

## 2013-06-01 NOTE — Assessment & Plan Note (Signed)
Will check followup labs today. Of note, patient has scheduled followup with Dr. Fausto Skillern next week.

## 2013-06-01 NOTE — Progress Notes (Signed)
Primary Cardiologist: Simona Huh, MD   HPI: Post hospital followup from [facility], following presentation with recurrent PAF. There was no definite evidence of active ischemia, with troponins NL. Also, he was deemed to be euvolemic, despite elevated pBNP of 2600. Patient was placed on PO amiodarone and assessed a CHADSVASC score of 5. He was discharged on Coumadin, which he had been on in the past, with Lovenox bridging. He had a followup INR of 2.0 here in our clinic, 2 days ago.  ICD interrogation indicated intrinsic atrial arrhythmia rhythm with Bi-V pacing  Additional medication adjustments notable for down titration of Demadex, secondary to slight exacerbation of CKD. Also, recommendation was to DC ASA, but to continue Plavix, given history of significant CAD.  He returns today reporting significant overall improvement in his exercise tolerance level. He denies any tachycardia palpitations. He continues to deny symptoms suggestive of decompensated heart failure.    12-lead EKG today, reviewed by me, indicates NSR with ventricular pacing at 63 bpm  Allergies  Allergen Reactions  . Codeine     REACTION: blisters, but able to take hydrocodone    Current Outpatient Prescriptions  Medication Sig Dispense Refill  . acetaminophen (TYLENOL) 650 MG CR tablet Take 2 by mouth twice daily        . amiodarone (PACERONE) 400 MG tablet Take 1/2 tab (200mg ) twice a day x 3 weeks, then 1/2 tab (200mg ) daily thereafter      . carvedilol (COREG) 12.5 MG tablet Take 1 tablet (12.5 mg total) by mouth 2 (two) times daily.  60 tablet  6  . clopidogrel (PLAVIX) 75 MG tablet Take 1 tablet (75 mg total) by mouth daily.      . ferrous sulfate 325 (65 FE) MG tablet Take 325 mg by mouth 2 (two) times daily with a meal.      . hydrALAZINE (APRESOLINE) 25 MG tablet Take 1 tablet (25 mg total) by mouth 3 (three) times daily.  90 tablet  6  . HYDROcodone-acetaminophen (NORCO) 10-325 MG per tablet Take 1 tablet by mouth  every 8 (eight) hours as needed.       . insulin lispro protamine-insulin lispro (HUMALOG 75/25) (75-25) 100 UNIT/ML SUSP Inject 30-35 Units into the skin 3 (three) times daily after meals.       . isosorbide mononitrate (IMDUR) 30 MG 24 hr tablet Take 30 mg by mouth daily.      Marland Kitchen l-methylfolate-B6-B12 (METANX) 3-35-2 MG TABS Take 1 tablet by mouth every other day.      Marland Kitchen L-Methylfolate-B6-B12 (NEURPATH-B) 3-35-2 MG TABS Take 1 tablet by mouth 2 (two) times daily.       Marland Kitchen linagliptin (TRADJENTA) 5 MG TABS tablet Take 5 mg by mouth daily.      . nitroGLYCERIN (NITROSTAT) 0.4 MG SL tablet Place 0.4 mg under the tongue every 5 (five) minutes as needed for chest pain. For chest pain      . potassium chloride (K-DUR) 10 MEQ tablet Take 0.5 tablets (5 mEq total) by mouth daily. With Demadex.  30 tablet  3  . pyridoxine (B-6) 50 MG tablet Take 50 mg by mouth daily.       Marland Kitchen torsemide (DEMADEX) 10 MG tablet Take 1 tablet (10 mg total) by mouth daily.  30 tablet  3  . warfarin (COUMADIN) 5 MG tablet Take 10mg  (two tablets) this evening (05/29/13), then 7.5mg  (one and one half tablets) 7/15 until further recommendations by Coumadin clinic.  30 tablet  3  .  Zinc 50 MG CAPS Take 1 capsule by mouth daily.        No current facility-administered medications for this visit.    Past Medical History  Diagnosis Date  . Type 2 diabetes mellitus   . Coronary atherosclerosis of native coronary artery     a. DES to RCA 03/2009. b. s/p PTCA to RCA for ISR, 05/2010. c. 03/2013 NSTEMI 2/2 severe prox RCA s/p PTCa/DES, med rx for residual dz.  . Hyperlipidemia   . TIA (transient ischemic attack)     Multiple TIAs  . Cardiomyopathy, ischemic     a. LVEF 25-35%.  . Morbid obesity   . Mitral regurgitation     Mild  . BPH (benign prostatic hypertrophy)   . LBBB (left bundle branch block)     s/p BiV ICD Implanted by Dr Graciela Husbands  . Paroxysmal atrial fibrillation     a. Coumadin discontinue in 2010 when the patient  required ASA/Plavix for stenting. b. Coumadin restarted 05/2013, Plavix continued, ASA stopped.   . Chronic kidney disease, stage III (moderate)     a. Followed by Dr. Fausto Skillern.  . HTN (hypertension)   . Anemia-chronic     a. Pt reports h/o anemia - was offered IV iron in past by nephrologist but declined and has taken PO instead.  . Aneurysm, cerebral   . MRSA (methicillin resistant Staphylococcus aureus)   . Anemia   . Pulmonary nodule     CXR, 03/2013, [facility]    Past Surgical History  Procedure Laterality Date  . Cholecystectomy    . Total knee arthroplasty    . Vasectomy    . Lung surgery    . Icd placement      Medtronic Protecta XT CRT-D    History   Social History  . Marital Status: Married    Spouse Name: N/A    Number of Children: N/A  . Years of Education: N/A   Occupational History  . Not on file.   Social History Main Topics  . Smoking status: Former Smoker -- 1.00 packs/day for 6 years    Types: Cigarettes    Quit date: 11/16/1964  . Smokeless tobacco: Former Neurosurgeon    Quit date: 04/15/1965  . Alcohol Use: Yes     Comment: once/month  . Drug Use: No  . Sexually Active: Not on file   Other Topics Concern  . Not on file   Social History Narrative  . No narrative on file   Social History Narrative  . No narrative on file    Problem Relation Age of Onset  . Heart disease Other     family h/o premature cardiovascular disease    ROS: no nausea, vomiting; no fever, chills; no melena, hematochezia; no claudication  PHYSICAL EXAM: BP 124/79  Pulse 60  Ht 5\' 8"  (1.727 m)  Wt 243 lb (110.224 kg)  BMI 36.96 kg/m2 GENERAL: 72 year old male, obese; NAD  HEENT: NCAT, PERRLA, EOMI; sclera clear; no xanthelasma  NECK: Neck veins flat, at 90  LUNGS: Faint, late basilar crackles, no wheezes  CARDIAC: RRR (S1, S2); no significant murmurs; no rubs or gallops  ABDOMEN: soft, protuberant  EXTREMETIES: Trace bilateral peripheral edema  SKIN: warm/dry; no  obvious rash/lesions  MUSCULOSKELETAL: no joint deformity  NEURO: no focal deficit; NL affect    EKG: reviewed and available in Electronic Records   ASSESSMENT & PLAN:  Paroxysmal atrial fibrillation In retrospect, I suspect that the patient's recent complaint of DOE was secondary  to recurrent PAF. This is based on his report today that he feels much better now that he is back in NSR, notably with much less DOE and with overall increased exercise tolerance. Recommendation is to decrease amiodarone to 200 mg twice a day of (x3 weeks), then down to 200 mg daily. Will check a complete metabolic profile, given recent mildly elevated LFTs, and we'll also check a TSH level. We'll order baseline PFTs with DLCO. He has since established here in our Coumadin clinic, and had a recent INR of 2.0.   As previously noted, he was taken off ASA, but with recommendation to continue on Plavix, given his significant CAD. Of note, he underwent DES of subtotal proximal RCA in early June, following presentation with NSTEMI. At that time, recommendation was that he remain on ASA/Plavix, indefinitely. Will defer to Dr. Diona Browner as to whether or not he now needs to remain on Plavix indefinitely, given resumption of anticoagulation. Regarding this, my recommendation is that he remain on Coumadin indefinitely, given his history of prior TIAs. Of note, he reports today that he previously had been on Coumadin for 10 years, but was taken off it in order to be treated with ASA/Plavix, following stenting in 2010.  SYSTOLIC HEART FAILURE, CHRONIC Essentially euvolemic by history and exam. Of note, Demadex dose recently cut in half at discharge. Patient does have CKD, and we will check followup labs, including BNP level.  ESSENTIAL HYPERTENSION, BENIGN Stable on current medication regimen.  Chronic kidney disease, stage III (moderate) Will check followup labs today. Of note, patient has scheduled followup with Dr. Kristian Covey  next week.    Gene Leighanna Kirn, PAC

## 2013-06-01 NOTE — Assessment & Plan Note (Signed)
Stable on current medication regimen 

## 2013-06-02 ENCOUNTER — Telehealth: Payer: Self-pay | Admitting: *Deleted

## 2013-06-02 NOTE — Telephone Encounter (Signed)
Notes Recorded by Lesle Chris, LPN on 1/61/0960 at 1:10 PM Patient notified. Will put recall in EPIC for 6 mo for repeat TSH.

## 2013-06-02 NOTE — Telephone Encounter (Signed)
Message copied by Lesle Chris on Fri Jun 02, 2013  1:10 PM ------      Message from: Rande Brunt      Created: Fri Jun 02, 2013 12:18 PM       baseling LFTs NL, but TSH slightly elevated. Pt just started on Amio. Recommend f/u TSH in 6 months ------

## 2013-06-05 ENCOUNTER — Encounter: Payer: Self-pay | Admitting: Physician Assistant

## 2013-06-05 NOTE — Addendum Note (Signed)
Addended by: Eustace Moore on: 06/05/2013 01:12 PM   Modules accepted: Orders

## 2013-06-06 ENCOUNTER — Ambulatory Visit (INDEPENDENT_AMBULATORY_CARE_PROVIDER_SITE_OTHER): Payer: Medicare Other | Admitting: *Deleted

## 2013-06-06 DIAGNOSIS — G459 Transient cerebral ischemic attack, unspecified: Secondary | ICD-10-CM

## 2013-06-06 DIAGNOSIS — I4891 Unspecified atrial fibrillation: Secondary | ICD-10-CM

## 2013-06-06 DIAGNOSIS — I480 Paroxysmal atrial fibrillation: Secondary | ICD-10-CM

## 2013-06-06 DIAGNOSIS — Z7901 Long term (current) use of anticoagulants: Secondary | ICD-10-CM

## 2013-06-06 LAB — PULMONARY FUNCTION TEST

## 2013-06-06 LAB — POCT INR: INR: 4.2

## 2013-06-11 ENCOUNTER — Inpatient Hospital Stay (HOSPITAL_COMMUNITY)
Admission: EM | Admit: 2013-06-11 | Discharge: 2013-06-15 | DRG: 871 | Disposition: A | Discharge status: Home / Self Care | Payer: Medicare Other | Attending: Internal Medicine | Admitting: Internal Medicine

## 2013-06-11 ENCOUNTER — Emergency Department (HOSPITAL_COMMUNITY): Payer: Medicare Other

## 2013-06-11 ENCOUNTER — Encounter (HOSPITAL_COMMUNITY): Payer: Self-pay | Admitting: Emergency Medicine

## 2013-06-11 DIAGNOSIS — I255 Ischemic cardiomyopathy: Secondary | ICD-10-CM

## 2013-06-11 DIAGNOSIS — I10 Essential (primary) hypertension: Secondary | ICD-10-CM

## 2013-06-11 DIAGNOSIS — N400 Enlarged prostate without lower urinary tract symptoms: Secondary | ICD-10-CM | POA: Diagnosis present

## 2013-06-11 DIAGNOSIS — I5020 Unspecified systolic (congestive) heart failure: Secondary | ICD-10-CM | POA: Diagnosis present

## 2013-06-11 DIAGNOSIS — N183 Chronic kidney disease, stage 3 unspecified: Secondary | ICD-10-CM

## 2013-06-11 DIAGNOSIS — Z794 Long term (current) use of insulin: Secondary | ICD-10-CM

## 2013-06-11 DIAGNOSIS — I480 Paroxysmal atrial fibrillation: Secondary | ICD-10-CM

## 2013-06-11 DIAGNOSIS — I2589 Other forms of chronic ischemic heart disease: Secondary | ICD-10-CM

## 2013-06-11 DIAGNOSIS — E785 Hyperlipidemia, unspecified: Secondary | ICD-10-CM | POA: Diagnosis present

## 2013-06-11 DIAGNOSIS — Z8614 Personal history of Methicillin resistant Staphylococcus aureus infection: Secondary | ICD-10-CM

## 2013-06-11 DIAGNOSIS — I2510 Atherosclerotic heart disease of native coronary artery without angina pectoris: Secondary | ICD-10-CM | POA: Diagnosis present

## 2013-06-11 DIAGNOSIS — Z95810 Presence of automatic (implantable) cardiac defibrillator: Secondary | ICD-10-CM

## 2013-06-11 DIAGNOSIS — E1010 Type 1 diabetes mellitus with ketoacidosis without coma: Secondary | ICD-10-CM | POA: Diagnosis present

## 2013-06-11 DIAGNOSIS — E119 Type 2 diabetes mellitus without complications: Secondary | ICD-10-CM

## 2013-06-11 DIAGNOSIS — I129 Hypertensive chronic kidney disease with stage 1 through stage 4 chronic kidney disease, or unspecified chronic kidney disease: Secondary | ICD-10-CM | POA: Diagnosis present

## 2013-06-11 DIAGNOSIS — D649 Anemia, unspecified: Secondary | ICD-10-CM

## 2013-06-11 DIAGNOSIS — N179 Acute kidney failure, unspecified: Secondary | ICD-10-CM | POA: Diagnosis present

## 2013-06-11 DIAGNOSIS — D509 Iron deficiency anemia, unspecified: Secondary | ICD-10-CM | POA: Diagnosis present

## 2013-06-11 DIAGNOSIS — K5730 Diverticulosis of large intestine without perforation or abscess without bleeding: Secondary | ICD-10-CM | POA: Diagnosis present

## 2013-06-11 DIAGNOSIS — I252 Old myocardial infarction: Secondary | ICD-10-CM

## 2013-06-11 DIAGNOSIS — R6510 Systemic inflammatory response syndrome (SIRS) of non-infectious origin without acute organ dysfunction: Principal | ICD-10-CM

## 2013-06-11 DIAGNOSIS — I119 Hypertensive heart disease without heart failure: Secondary | ICD-10-CM | POA: Diagnosis present

## 2013-06-11 DIAGNOSIS — K4020 Bilateral inguinal hernia, without obstruction or gangrene, not specified as recurrent: Secondary | ICD-10-CM | POA: Diagnosis present

## 2013-06-11 DIAGNOSIS — J189 Pneumonia, unspecified organism: Secondary | ICD-10-CM | POA: Diagnosis present

## 2013-06-11 DIAGNOSIS — Z87891 Personal history of nicotine dependence: Secondary | ICD-10-CM

## 2013-06-11 DIAGNOSIS — I509 Heart failure, unspecified: Secondary | ICD-10-CM | POA: Diagnosis present

## 2013-06-11 DIAGNOSIS — I059 Rheumatic mitral valve disease, unspecified: Secondary | ICD-10-CM | POA: Diagnosis present

## 2013-06-11 DIAGNOSIS — N390 Urinary tract infection, site not specified: Secondary | ICD-10-CM | POA: Diagnosis present

## 2013-06-11 DIAGNOSIS — I4891 Unspecified atrial fibrillation: Secondary | ICD-10-CM | POA: Diagnosis present

## 2013-06-11 LAB — CBC WITH DIFFERENTIAL/PLATELET
Basophils Absolute: 0 10*3/uL (ref 0.0–0.1)
Basophils Relative: 0 % (ref 0–1)
Eosinophils Absolute: 0 10*3/uL (ref 0.0–0.7)
Eosinophils Relative: 0 % (ref 0–5)
HCT: 33.7 % — ABNORMAL LOW (ref 39.0–52.0)
Hemoglobin: 10.7 g/dL — ABNORMAL LOW (ref 13.0–17.0)
Lymphocytes Relative: 4 % — ABNORMAL LOW (ref 12–46)
Lymphs Abs: 0.7 10*3/uL (ref 0.7–4.0)
MCH: 25.7 pg — ABNORMAL LOW (ref 26.0–34.0)
MCHC: 31.8 g/dL (ref 30.0–36.0)
MCV: 80.8 fL (ref 78.0–100.0)
Monocytes Absolute: 1 10*3/uL (ref 0.1–1.0)
Monocytes Relative: 7 % (ref 3–12)
Neutro Abs: 13.7 10*3/uL — ABNORMAL HIGH (ref 1.7–7.7)
Neutrophils Relative %: 89 % — ABNORMAL HIGH (ref 43–77)
Platelets: 262 10*3/uL (ref 150–400)
RBC: 4.17 MIL/uL — ABNORMAL LOW (ref 4.22–5.81)
RDW: 18.1 % — ABNORMAL HIGH (ref 11.5–15.5)
WBC: 15.5 10*3/uL — ABNORMAL HIGH (ref 4.0–10.5)

## 2013-06-11 LAB — URINALYSIS, ROUTINE W REFLEX MICROSCOPIC
Bilirubin Urine: NEGATIVE
Glucose, UA: NEGATIVE mg/dL
Hgb urine dipstick: NEGATIVE
Ketones, ur: NEGATIVE mg/dL
Nitrite: NEGATIVE
Protein, ur: 100 mg/dL — AB
Specific Gravity, Urine: 1.018 (ref 1.005–1.030)
Urobilinogen, UA: 0.2 mg/dL (ref 0.0–1.0)
pH: 5.5 (ref 5.0–8.0)

## 2013-06-11 LAB — GLUCOSE, CAPILLARY: Glucose-Capillary: 179 mg/dL — ABNORMAL HIGH (ref 70–99)

## 2013-06-11 LAB — COMPREHENSIVE METABOLIC PANEL
ALT: 16 U/L (ref 0–53)
AST: 30 U/L (ref 0–37)
Albumin: 3.5 g/dL (ref 3.5–5.2)
Alkaline Phosphatase: 91 U/L (ref 39–117)
BUN: 30 mg/dL — ABNORMAL HIGH (ref 6–23)
CO2: 23 mEq/L (ref 19–32)
Calcium: 9.2 mg/dL (ref 8.4–10.5)
Chloride: 93 mEq/L — ABNORMAL LOW (ref 96–112)
Creatinine, Ser: 1.78 mg/dL — ABNORMAL HIGH (ref 0.50–1.35)
GFR calc Af Amer: 42 mL/min — ABNORMAL LOW (ref >90)
GFR calc non Af Amer: 36 mL/min — ABNORMAL LOW (ref >90)
Glucose, Bld: 171 mg/dL — ABNORMAL HIGH (ref 70–99)
Potassium: 5.1 mEq/L (ref 3.5–5.1)
Sodium: 129 mEq/L — ABNORMAL LOW (ref 135–145)
Total Bilirubin: 0.5 mg/dL (ref 0.3–1.2)
Total Protein: 7.5 g/dL (ref 6.0–8.3)

## 2013-06-11 LAB — PROTIME-INR: Prothrombin Time: 22.7 seconds — ABNORMAL HIGH (ref 11.6–15.2)

## 2013-06-11 LAB — TROPONIN I: Troponin I: <0.3 ng/mL (ref <0.30)

## 2013-06-11 LAB — CG4 I-STAT (LACTIC ACID): Lactic Acid, Venous: 1.59 mmol/L (ref 0.5–2.2)

## 2013-06-11 LAB — APTT: aPTT: 58 seconds — ABNORMAL HIGH (ref 24–37)

## 2013-06-11 MED ORDER — CARVEDILOL 12.5 MG PO TABS
12.5000 mg | ORAL_TABLET | Freq: Two times a day (BID) | ORAL | Status: DC
Start: 2013-06-12 — End: 2013-06-15
  Administered 2013-06-12 – 2013-06-15 (×7): 12.5 mg via ORAL
  Filled 2013-06-11 (×9): qty 1

## 2013-06-11 MED ORDER — SODIUM CHLORIDE 0.9 % IV SOLN
Freq: Once | INTRAVENOUS | Status: AC
  Administered 2013-06-11: 16:00:00 via INTRAVENOUS

## 2013-06-11 MED ORDER — NITROGLYCERIN 0.4 MG SL SUBL: 0.4000 mg | SUBLINGUAL_TABLET | SUBLINGUAL | Status: DC | PRN

## 2013-06-11 MED ORDER — AMIODARONE HCL 200 MG PO TABS
200.0000 mg | ORAL_TABLET | Freq: Every day | ORAL | Status: DC
  Administered 2013-06-12 – 2013-06-15 (×4): 200 mg via ORAL
  Filled 2013-06-11 (×4): qty 1

## 2013-06-11 MED ORDER — NEURPATH-B 3-35-2 MG PO TABS: 1.0000 | ORAL_TABLET | Freq: Two times a day (BID) | ORAL | Status: DC

## 2013-06-11 MED ORDER — INSULIN ASPART 100 UNIT/ML ~~LOC~~ SOLN
0.0000 [IU] | Freq: Three times a day (TID) | SUBCUTANEOUS | Status: DC
  Administered 2013-06-12 – 2013-06-13 (×3): 3 [IU] via SUBCUTANEOUS
  Administered 2013-06-14 – 2013-06-15 (×2): 2 [IU] via SUBCUTANEOUS

## 2013-06-11 MED ORDER — SODIUM CHLORIDE 0.9 % IJ SOLN
3.0000 mL | Freq: Two times a day (BID) | INTRAMUSCULAR | Status: DC
  Administered 2013-06-11 – 2013-06-15 (×4): 3 mL via INTRAVENOUS

## 2013-06-11 MED ORDER — VANCOMYCIN HCL 10 G IV SOLR
2000.0000 mg | Freq: Once | INTRAVENOUS | Status: AC
  Administered 2013-06-11: 2000 mg via INTRAVENOUS
  Filled 2013-06-11: qty 2000

## 2013-06-11 MED ORDER — MENTHOL 3 MG MT LOZG
1.0000 | LOZENGE | OROMUCOSAL | Status: DC | PRN
  Filled 2013-06-11: qty 9

## 2013-06-11 MED ORDER — INSULIN ASPART PROT & ASPART (70-30 MIX) 100 UNIT/ML ~~LOC~~ SUSP
30.0000 [IU] | Freq: Two times a day (BID) | SUBCUTANEOUS | Status: DC
  Administered 2013-06-12 – 2013-06-15 (×4): 30 [IU] via SUBCUTANEOUS
  Filled 2013-06-11: qty 10

## 2013-06-11 MED ORDER — WARFARIN SODIUM 5 MG PO TABS
5.0000 mg | ORAL_TABLET | Freq: Once | ORAL | Status: AC
  Administered 2013-06-11: 5 mg via ORAL
  Filled 2013-06-11: qty 1

## 2013-06-11 MED ORDER — SODIUM CHLORIDE 0.9 % IV BOLUS (SEPSIS)
1000.0000 mL | Freq: Once | INTRAVENOUS | Status: AC
  Administered 2013-06-11: 1000 mL via INTRAVENOUS

## 2013-06-11 MED ORDER — PNEUMOCOCCAL VAC POLYVALENT 25 MCG/0.5ML IJ INJ
0.5000 mL | INJECTION | INTRAMUSCULAR | Status: AC
  Administered 2013-06-12: 0.5 mL via INTRAMUSCULAR
  Filled 2013-06-11: qty 0.5

## 2013-06-11 MED ORDER — WARFARIN SODIUM 5 MG PO TABS: 5.0000 mg | ORAL_TABLET | Freq: Every day | ORAL | Status: DC

## 2013-06-11 MED ORDER — HYDROCODONE-ACETAMINOPHEN 10-325 MG PO TABS: 1.0000 | ORAL_TABLET | Freq: Three times a day (TID) | ORAL | Status: DC | PRN

## 2013-06-11 MED ORDER — ZINC 50 MG PO CAPS: 50.0000 mg | ORAL_CAPSULE | Freq: Every day | ORAL | Status: DC

## 2013-06-11 MED ORDER — VITAMIN B-6 50 MG PO TABS
50.0000 mg | ORAL_TABLET | Freq: Every day | ORAL | Status: DC
  Administered 2013-06-12 – 2013-06-15 (×4): 50 mg via ORAL
  Filled 2013-06-11 (×4): qty 1

## 2013-06-11 MED ORDER — INSULIN ASPART 100 UNIT/ML ~~LOC~~ SOLN: 0.0000 [IU] | Freq: Every day | SUBCUTANEOUS | Status: DC

## 2013-06-11 MED ORDER — DEXTROSE 5 % IV SOLN
1.0000 g | Freq: Two times a day (BID) | INTRAVENOUS | Status: DC
  Administered 2013-06-11: 1 g via INTRAVENOUS
  Filled 2013-06-11 (×3): qty 1

## 2013-06-11 MED ORDER — FERROUS SULFATE 325 (65 FE) MG PO TABS
325.0000 mg | ORAL_TABLET | Freq: Every evening | ORAL | Status: DC
  Administered 2013-06-12 – 2013-06-14 (×3): 325 mg via ORAL
  Filled 2013-06-11 (×5): qty 1

## 2013-06-11 MED ORDER — L-METHYLFOLATE-B6-B12 3-35-2 MG PO TABS
1.0000 | ORAL_TABLET | Freq: Every day | ORAL | Status: DC
  Administered 2013-06-12 – 2013-06-15 (×4): 1 via ORAL
  Filled 2013-06-11 (×4): qty 1

## 2013-06-11 MED ORDER — ISOSORBIDE MONONITRATE ER 30 MG PO TB24
30.0000 mg | ORAL_TABLET | Freq: Every day | ORAL | Status: DC
  Administered 2013-06-12 – 2013-06-15 (×4): 30 mg via ORAL
  Filled 2013-06-11 (×4): qty 1

## 2013-06-11 MED ORDER — ACETAMINOPHEN 500 MG PO TABS
1000.0000 mg | ORAL_TABLET | Freq: Two times a day (BID) | ORAL | Status: DC
  Administered 2013-06-11 – 2013-06-15 (×8): 1000 mg via ORAL
  Filled 2013-06-11 (×12): qty 2

## 2013-06-11 MED ORDER — HYDRALAZINE HCL 25 MG PO TABS
25.0000 mg | ORAL_TABLET | Freq: Three times a day (TID) | ORAL | Status: DC
  Administered 2013-06-11 – 2013-06-15 (×11): 25 mg via ORAL
  Filled 2013-06-11 (×13): qty 1

## 2013-06-11 MED ORDER — PIPERACILLIN-TAZOBACTAM 3.375 G IVPB
3.3750 g | Freq: Three times a day (TID) | INTRAVENOUS | Status: DC
  Administered 2013-06-11 – 2013-06-14 (×9): 3.375 g via INTRAVENOUS
  Filled 2013-06-11 (×12): qty 50

## 2013-06-11 MED ORDER — WARFARIN - PHARMACIST DOSING INPATIENT: Freq: Every day | Status: DC

## 2013-06-11 MED ORDER — VANCOMYCIN HCL 10 G IV SOLR
1500.0000 mg | INTRAVENOUS | Status: DC
  Administered 2013-06-12 – 2013-06-14 (×3): 1500 mg via INTRAVENOUS
  Filled 2013-06-11 (×3): qty 1500

## 2013-06-11 MED ORDER — ACETAMINOPHEN 325 MG PO TABS
650.0000 mg | ORAL_TABLET | Freq: Once | ORAL | Status: AC
  Administered 2013-06-11: 650 mg via ORAL
  Filled 2013-06-11: qty 2

## 2013-06-11 MED ORDER — TORSEMIDE 5 MG PO TABS
5.0000 mg | ORAL_TABLET | Freq: Every evening | ORAL | Status: DC
  Administered 2013-06-11: 5 mg via ORAL
  Filled 2013-06-11 (×2): qty 1

## 2013-06-11 MED ORDER — ZINC SULFATE 220 (50 ZN) MG PO CAPS
220.0000 mg | ORAL_CAPSULE | Freq: Every day | ORAL | Status: DC
  Administered 2013-06-12 – 2013-06-15 (×4): 220 mg via ORAL
  Filled 2013-06-11 (×4): qty 1

## 2013-06-11 MED ORDER — LINAGLIPTIN 5 MG PO TABS
5.0000 mg | ORAL_TABLET | Freq: Every day | ORAL | Status: DC
  Filled 2013-06-11: qty 1

## 2013-06-11 MED ORDER — CLOPIDOGREL BISULFATE 75 MG PO TABS
75.0000 mg | ORAL_TABLET | Freq: Every day | ORAL | Status: DC
  Administered 2013-06-12 – 2013-06-15 (×4): 75 mg via ORAL
  Filled 2013-06-11 (×4): qty 1

## 2013-06-11 MED ORDER — ACETAMINOPHEN ER 650 MG PO TBCR: 1300.0000 mg | EXTENDED_RELEASE_TABLET | Freq: Two times a day (BID) | ORAL | Status: DC

## 2013-06-11 NOTE — ED Provider Notes (Signed)
CSN: [id number]     Arrival date & time 06/11/13  1335 History     First MD Initiated Contact with Patient 06/11/13 1521     Chief Complaint  Patient presents with  . Chills   (Consider location/radiation/quality/duration/timing/severity/associated sxs/prior Treatment) HPI Comments: Pt comes in with cc of chills. Pt has hx of DM, CAD, TIA, CKD, AFIB. Pt states that starting yday evening, he has been feeling a little weak and started having chills. Pt denies nausea, emesis, fevers, chest pains, shortness of breath, headaches, abdominal pain, uti like symptoms, or rash. He did endorse having a cough x 4 weeks.   The history is provided by the patient.    Past Medical History  Diagnosis Date  . Type 2 diabetes mellitus   . Coronary atherosclerosis of native coronary artery     a. DES to RCA 03/2009. b. s/p PTCA to RCA for ISR, 05/2010. c. 03/2013 NSTEMI 2/2 severe prox RCA s/p PTCa/DES, med rx for residual dz.  . Hyperlipidemia   . TIA (transient ischemic attack)     Multiple TIAs  . Cardiomyopathy, ischemic     a. LVEF 25-35%.  . Morbid obesity   . Mitral regurgitation     Mild  . BPH (benign prostatic hypertrophy)   . LBBB (left bundle branch block)     s/p BiV ICD Implanted by Dr Graciela Husbands  . Paroxysmal atrial fibrillation     a. Coumadin discontinue in 2010 when the patient required ASA/Plavix for stenting. b. Coumadin restarted 05/2013, Plavix continued, ASA stopped.   . Chronic kidney disease, stage III (moderate)     a. Followed by Dr. Fausto Skillern.  . HTN (hypertension)   . Anemia-chronic     a. Pt reports h/o anemia - was offered IV iron in past by nephrologist but declined and has taken PO instead.  . Aneurysm, cerebral   . MRSA (methicillin resistant Staphylococcus aureus)   . Anemia   . Pulmonary nodule     CXR, 03/2013, [facility]   Past Surgical History  Procedure Laterality Date  . Cholecystectomy    . Total knee arthroplasty    . Vasectomy    . Lung surgery    . Icd  placement      Medtronic Protecta XT CRT-D   Family History  Problem Relation Age of Onset  . Heart disease Other     family h/o premature cardiovascular disease   History  Substance Use Topics  . Smoking status: Former Smoker -- 1.00 packs/day for 6 years    Types: Cigarettes    Quit date: 11/16/1964  . Smokeless tobacco: Former Neurosurgeon    Quit date: 04/15/1965  . Alcohol Use: Yes     Comment: once/month    Review of Systems  Constitutional: Positive for fever, chills, activity change and fatigue.  HENT: Negative for neck pain.   Eyes: Negative for visual disturbance.  Respiratory: Positive for cough. Negative for chest tightness, shortness of breath and wheezing.   Cardiovascular: Negative for chest pain.  Gastrointestinal: Negative for nausea, vomiting, abdominal pain, diarrhea and abdominal distention.  Genitourinary: Negative for dysuria, enuresis and difficulty urinating.  Musculoskeletal: Negative for arthralgias.  Skin: Negative for rash.  Neurological: Positive for weakness. Negative for dizziness, light-headedness and headaches.  Psychiatric/Behavioral: Negative for confusion.    Allergies  Codeine  Home Medications   Current Outpatient Rx  Name  Route  Sig  Dispense  Refill  . acetaminophen (TYLENOL) 650 MG CR tablet   Oral  Take 1,300 mg by mouth 2 (two) times daily.          Marland Kitchen amiodarone (PACERONE) 400 MG tablet   Oral   Take 200 mg by mouth daily.         . carvedilol (COREG) 12.5 MG tablet   Oral   Take 12.5 mg by mouth 2 (two) times daily with a meal.         . clopidogrel (PLAVIX) 75 MG tablet   Oral   Take 75 mg by mouth daily.         . ferrous sulfate 325 (65 FE) MG tablet   Oral   Take 325 mg by mouth every evening.          . hydrALAZINE (APRESOLINE) 25 MG tablet   Oral   Take 25 mg by mouth 3 (three) times daily.         Marland Kitchen HYDROcodone-acetaminophen (NORCO) 10-325 MG per tablet   Oral   Take 1 tablet by mouth every 8  (eight) hours as needed for pain.          Marland Kitchen insulin lispro protamine-insulin lispro (HUMALOG 75/25) (75-25) 100 UNIT/ML SUSP   Subcutaneous   Inject 30-35 Units into the skin 3 (three) times daily after meals.          . isosorbide mononitrate (IMDUR) 30 MG 24 hr tablet   Oral   Take 30 mg by mouth daily.         Marland Kitchen l-methylfolate-B6-B12 (METANX) 3-35-2 MG TABS   Oral   Take 1 tablet by mouth daily.          Marland Kitchen L-Methylfolate-B6-B12 (NEURPATH-B) 3-35-2 MG TABS   Oral   Take 1 tablet by mouth 2 (two) times daily.          Marland Kitchen linagliptin (TRADJENTA) 5 MG TABS tablet   Oral   Take 5 mg by mouth daily.         . nitroGLYCERIN (NITROSTAT) 0.4 MG SL tablet   Sublingual   Place 0.4 mg under the tongue every 5 (five) minutes as needed for chest pain. For chest pain         . pyridoxine (B-6) 50 MG tablet   Oral   Take 50 mg by mouth daily.          Marland Kitchen torsemide (DEMADEX) 10 MG tablet   Oral   Take 5 mg by mouth every evening.         . warfarin (COUMADIN) 5 MG tablet   Oral   Take 5 mg by mouth daily.         . Zinc 50 MG CAPS   Oral   Take 50 mg by mouth daily.           BP 115/52  Pulse 75  Temp(Src) 102.9 F (39.4 C) (Oral)  Resp 18  SpO2 96% Physical Exam  Nursing note and vitals reviewed. Constitutional: He is oriented to person, place, and time. He appears well-developed.  HENT:  Head: Normocephalic and atraumatic.  Eyes: Conjunctivae and EOM are normal. Pupils are equal, round, and reactive to light.  Neck: Normal range of motion. Neck supple. No JVD present.  Cardiovascular: Normal rate.   Pulmonary/Chest: Effort normal and breath sounds normal.  Abdominal: Soft. Bowel sounds are normal. He exhibits no distension. There is no tenderness. There is no rebound and no guarding.  Musculoskeletal: He exhibits no edema.  Neurological: He is alert and oriented to person, place,  and time.  Skin: Skin is warm. No rash noted.    ED Course    Procedures (including critical care time)  Labs Reviewed  CULTURE, BLOOD (ROUTINE X 2)  CULTURE, BLOOD (ROUTINE X 2)  URINE CULTURE  CBC WITH DIFFERENTIAL  COMPREHENSIVE METABOLIC PANEL  TROPONIN I  URINALYSIS, ROUTINE W REFLEX MICROSCOPIC  PROTIME-INR  APTT   Dg Chest 2 View  06/11/2013   *RADIOLOGY REPORT*  Clinical Data: Chest pain  CHEST - 2 VIEW  Comparison:  September 26, 2013  Findings:  There is no edema or consolidation.  Heart is borderline enlarged with normal pulmonary vascularity.  Pacemaker leads are attached to the right atrium and right ventricle.  No adenopathy. No pneumothorax.  There is degenerative change in the thoracic spine.  IMPRESSION: No edema or consolidation.  Stable borderline cardiomegaly.   Original Report Authenticated By: Bretta Bang, M.D.   No diagnosis found.  MDM  Pt comes in with cc of fatigue, chills, noted to be febrile here.  DDx: Sepsis syndrome DKA Infection - pneumonia/UTI/Cellulitis Dehydration Electrolyte abnormality Tox syndrome  Hx and exam truly not suggestive of any source of infection. He has a cough x 4 weeks, and the lung exam indicates mild rales. Pt is diabetic with other co morbidities. States that his pressure was running a bit low yday, and he halved his BP meds - so possible occult sepsis considered as well.  Will get basic labs and reassess.   Derwood Kaplan, MD 06/11/13 [phone]

## 2013-06-11 NOTE — H&P (Signed)
[name] is an 72 y.o. male.    [facility] ADMISSION H&P  Chief Complaint: Cough and chills  HPI: 72 yr. Old WM w/ hx PAF on coumadin and amiodarone, s/p BiV, hx NSTEMI with PCI and DES placement 04/2013, HFrEF, iron deficiency anemia, CKD stage 3 (Cr 1.3), HL, HTN, presents due to chills and cough. He states he had an appointment with his nephrologist 2 days ago (routine) and was told that his BP was low (SBP 90's). As a result, one of his BP meds was decreased in dosage, which helped. Yesterday he states he started to have a cough with productive clear sputum and significant chills.  His wife states he's had a fever, and he typically does not have fevers like this.  Currently, he denies any SOB or CP.  He states he has had increased urinary frequency but no dysuria. He denies diarrhea. He denies abdominal pain. He denies any change in LE swelling.  He denies HA's. In the ED, he was found to have a Temp of 102.9 F.  WBC noted to be 15k with a neutrophil predominance, but no bandemia.  CXR does not show a definite infiltrate.  UA showed mod LE and 21-50 WBCs with many bacteria, but negative nitrites. The ED has ordered 1 g IV Cefepime.  Clinically, he looks well and is sitting on a chair in his ED room. He does not appear toxic.   Past Medical History  Diagnosis Date  . Type 2 diabetes mellitus   . Coronary atherosclerosis of native coronary artery     a. DES to RCA 03/2009. b. s/p PTCA to RCA for ISR, 05/2010. c. 03/2013 NSTEMI 2/2 severe prox RCA s/p PTCa/DES, med rx for residual dz.  . Hyperlipidemia   . TIA (transient ischemic attack)     Multiple TIAs  . Cardiomyopathy, ischemic     a. LVEF 25-35%.  . Morbid obesity   . Mitral regurgitation     Mild  . BPH (benign prostatic hypertrophy)   . LBBB (left bundle branch block)     s/p BiV ICD Implanted by Dr Graciela Husbands  . Paroxysmal atrial fibrillation     a. Coumadin discontinue in 2010 when the patient required ASA/Plavix for  stenting. b. Coumadin restarted 05/2013, Plavix continued, ASA stopped.   . Chronic kidney disease, stage III (moderate)     a. Followed by Dr. Fausto Skillern.  . HTN (hypertension)   . Anemia-chronic     a. Pt reports h/o anemia - was offered IV iron in past by nephrologist but declined and has taken PO instead.  . Aneurysm, cerebral   . MRSA (methicillin resistant Staphylococcus aureus)   . Anemia   . Pulmonary nodule     CXR, 03/2013, [facility]    Past Surgical History  Procedure Laterality Date  . Cholecystectomy    . Total knee arthroplasty    . Vasectomy    . Lung surgery    . Icd placement      Medtronic Protecta XT CRT-D    Family History  Problem Relation Age of Onset  . Heart disease Other     family h/o premature cardiovascular disease   Social History:  reports that he quit smoking about 48 years ago. His smoking use included Cigarettes. He has a 6 pack-year smoking history. He quit smokeless tobacco use about 48 years ago. He reports that  drinks alcohol. He reports that he does not use illicit drugs.  Allergies:  Allergies  Allergen Reactions  . Codeine     REACTION: blisters, but able to take hydrocodone     (Not in a hospital admission)  Results for orders placed during the hospital encounter of 06/11/13 (from the past 48 hour(s))  URINALYSIS, ROUTINE W REFLEX MICROSCOPIC     Status: Abnormal   Collection Time    06/11/13  4:03 PM      Result Value Range   Color, Urine YELLOW  YELLOW   APPearance CLOUDY (*) CLEAR   Specific Gravity, Urine 1.018  1.005 - 1.030   pH 5.5  5.0 - 8.0   Glucose, UA NEGATIVE  NEGATIVE mg/dL   Hgb urine dipstick NEGATIVE  NEGATIVE   Bilirubin Urine NEGATIVE  NEGATIVE   Ketones, ur NEGATIVE  NEGATIVE mg/dL   Protein, ur 409 (*) NEGATIVE mg/dL   Urobilinogen, UA 0.2  0.0 - 1.0 mg/dL   Nitrite NEGATIVE  NEGATIVE   Leukocytes, UA MODERATE (*) NEGATIVE  URINE MICROSCOPIC-ADD ON     Status: Abnormal   Collection Time    06/11/13  4:03  PM      Result Value Range   WBC, UA 21-50  <3 WBC/hpf   Bacteria, UA MANY (*) RARE   Casts HYALINE CASTS (*) NEGATIVE  CG4 I-STAT (LACTIC ACID)     Status: None   Collection Time    06/11/13  5:06 PM      Result Value Range   Lactic Acid, Venous 1.59  0.5 - 2.2 mmol/L  CBC WITH DIFFERENTIAL     Status: Abnormal   Collection Time    06/11/13  5:08 PM      Result Value Range   WBC 15.5 (*) 4.0 - 10.5 K/uL   RBC 4.17 (*) 4.22 - 5.81 MIL/uL   Hemoglobin 10.7 (*) 13.0 - 17.0 g/dL   HCT 81.1 (*) 91.4 - 78.2 %   MCV 80.8  78.0 - 100.0 fL   MCH 25.7 (*) 26.0 - 34.0 pg   MCHC 31.8  30.0 - 36.0 g/dL   RDW 95.6 (*) 21.3 - 08.6 %   Platelets 262  150 - 400 K/uL   Neutrophils Relative % 89 (*) 43 - 77 %   Neutro Abs 13.7 (*) 1.7 - 7.7 K/uL   Lymphocytes Relative 4 (*) 12 - 46 %   Lymphs Abs 0.7  0.7 - 4.0 K/uL   Monocytes Relative 7  3 - 12 %   Monocytes Absolute 1.0  0.1 - 1.0 K/uL   Eosinophils Relative 0  0 - 5 %   Eosinophils Absolute 0.0  0.0 - 0.7 K/uL   Basophils Relative 0  0 - 1 %   Basophils Absolute 0.0  0.0 - 0.1 K/uL  COMPREHENSIVE METABOLIC PANEL     Status: Abnormal   Collection Time    06/11/13  5:08 PM      Result Value Range   Sodium 129 (*) 135 - 145 mEq/L   Potassium 5.1  3.5 - 5.1 mEq/L   Chloride 93 (*) 96 - 112 mEq/L   CO2 23  19 - 32 mEq/L   Glucose, Bld 171 (*) 70 - 99 mg/dL   BUN 30 (*) 6 - 23 mg/dL   Creatinine, Ser 5.78 (*) 0.50 - 1.35 mg/dL   Calcium 9.2  8.4 - 46.9 mg/dL   Total Protein 7.5  6.0 - 8.3 g/dL   Albumin 3.5  3.5 - 5.2 g/dL   AST 30  0 - 37 U/L  ALT 16  0 - 53 U/L   Alkaline Phosphatase 91  39 - 117 U/L   Total Bilirubin 0.5  0.3 - 1.2 mg/dL   GFR calc non Af Amer 36 (*) >90 mL/min   GFR calc Af Amer 42 (*) >90 mL/min   Comment:            The eGFR has been calculated     using the CKD EPI equation.     This calculation has not been     validated in all clinical     situations.     eGFR's persistently     <90 mL/min signify      possible Chronic Kidney Disease.  PROTIME-INR     Status: Abnormal   Collection Time    06/11/13  5:08 PM      Result Value Range   Prothrombin Time 22.7 (*) 11.6 - 15.2 seconds   INR 2.08 (*) 0.00 - 1.49  APTT     Status: Abnormal   Collection Time    06/11/13  5:08 PM      Result Value Range   aPTT 58 (*) 24 - 37 seconds   Comment:            IF BASELINE aPTT IS ELEVATED,     SUGGEST PATIENT RISK ASSESSMENT     BE USED TO DETERMINE APPROPRIATE     ANTICOAGULANT THERAPY.  TROPONIN I     Status: None   Collection Time    06/11/13  5:09 PM      Result Value Range   Troponin I <0.30  <0.30 ng/mL   Comment:            Due to the release kinetics of cTnI,     a negative result within the first hours     of the onset of symptoms does not rule out     myocardial infarction with certainty.     If myocardial infarction is still suspected,     repeat the test at appropriate intervals.   Dg Chest 2 View  06/11/2013   *RADIOLOGY REPORT*  Clinical Data: Chest pain  CHEST - 2 VIEW  Comparison:  September 26, 2013  Findings:  There is no edema or consolidation.  Heart is borderline enlarged with normal pulmonary vascularity.  Pacemaker leads are attached to the right atrium and right ventricle.  No adenopathy. No pneumothorax.  There is degenerative change in the thoracic spine.  IMPRESSION: No edema or consolidation.  Stable borderline cardiomegaly.   Original Report Authenticated By: Bretta Bang, M.D.    Review of Systems  Constitutional: Positive for fever, chills and malaise/fatigue. Negative for diaphoresis.  HENT: Negative for congestion and sore throat.   Eyes: Negative for blurred vision.  Respiratory: Positive for cough and sputum production. Negative for hemoptysis, shortness of breath and wheezing.   Cardiovascular: Positive for leg swelling. Negative for chest pain, palpitations, orthopnea, claudication and PND.  Gastrointestinal: Negative for heartburn, nausea, vomiting,  abdominal pain, diarrhea, constipation, blood in stool and melena.  Genitourinary: Positive for frequency. Negative for dysuria, urgency, hematuria and flank pain.  Musculoskeletal: Negative for myalgias.  Neurological: Negative for dizziness, loss of consciousness and headaches.  Psychiatric/Behavioral: Negative for depression.    Blood pressure 115/55, pulse 77, temperature 100 F (37.8 C), temperature source Oral, resp. rate 17, SpO2 96.00%. Physical Exam  Constitutional: He is oriented to person, place, and time. He appears well-developed and well-nourished.  Non-toxic appearance. He does  not have a sickly appearance. He does not appear ill. No distress.  HENT:  Head: Normocephalic and atraumatic.  Eyes: Conjunctivae and EOM are normal. Pupils are equal, round, and reactive to light.  Neck: Normal range of motion. Neck supple. No JVD present.  Cardiovascular: Normal rate, regular rhythm, normal heart sounds and intact distal pulses.  Exam reveals no gallop and no friction rub.   No murmur heard. Respiratory: Effort normal and breath sounds normal. He has no wheezes. He has no rales.  GI: Soft. Bowel sounds are normal. He exhibits no distension. There is no tenderness. There is no rebound and no guarding.  Musculoskeletal: Normal range of motion. He exhibits edema.  Neurological: He is alert and oriented to person, place, and time. He has normal reflexes. No cranial nerve deficit.  Skin: Skin is warm. He is not diaphoretic.  Psychiatric: He has a normal mood and affect. His behavior is normal.     Assessment/Plan 72 yr. Old WM w/ hx PAF on coumadin and amiodarone, s/p BiV, hx NSTEMI with PCI and DES placement 04/2013, HFrEF, iron deficiency anemia, CKD stage 3 (Cr 1.3), HL, HTN, presents due to chills and cough, with SIRS. 1) SIRS: At this time DDx includes HCAP and UTI causing his fever. Due to his recent hospitalization and co morbidities as well as MRSA colonization, I will start him  on Vancomycin per pharmacy and Zosyn.  BC have been sent as well as UC. He will be monitored on telemetry 2) CKD stage 3, possibly component of AKI: This may have been from overdiuresis. He has been above the his baseline for over a month. But given his EF and his euvolemic state, I will hold off on IVF at this time. Cr will be monitored, especially given concern for cardiorenal state in this patient. 3) PAfib: Cont amiodarone and coumadin per pharmacy. He is therapeutic.  4) IDDM type 2: Insulin ordered, BG will be monitored. 5) HTN: hemodynamics stable. Home meds. 6) Proph: no lovenox, he is on coumadin. 7) Code: FULL  Jonah Blue 06/11/2013, 7:37 PM

## 2013-06-11 NOTE — ED Notes (Addendum)
cefipime stopped until new IV site could be established.

## 2013-06-11 NOTE — ED Notes (Signed)
No changes, cefepime re-started to new IV site, report called, pt returned to stretcher from chair, alert, NAD, calm, ambulatory with steady gait, denies needs or questions, wife has left for home.

## 2013-06-11 NOTE — Progress Notes (Signed)
ANTICOAGULATION and ANTIBIOTIC CONSULT NOTE - Initial Consult  Pharmacy Consult for Vancomycin, Warfarin Indication: PNA, Afib  Allergies  Allergen Reactions  . Codeine     REACTION: blisters, but able to take hydrocodone    Patient Measurements:   Wt: 110.2kg  Vital Signs: Temp: 100 F (37.8 C) (07/27 1900) Temp src: Oral (07/27 1900) BP: 113/95 mmHg (07/27 1915) Pulse Rate: 68 (07/27 1940)  Labs:  Recent Labs  06/11/13 1708 06/11/13 1709  HGB 10.7*  --   HCT 33.7*  --   PLT 262  --   APTT 58*  --   LABPROT 22.7*  --   INR 2.08*  --   CREATININE 1.78*  --   TROPONINI  --  <0.30    The CrCl is unknown because both a height and weight (above a minimum accepted value) are required for this calculation.   Medical History: Past Medical History  Diagnosis Date  . Type 2 diabetes mellitus   . Coronary atherosclerosis of native coronary artery     a. DES to RCA 03/2009. b. s/p PTCA to RCA for ISR, 05/2010. c. 03/2013 NSTEMI 2/2 severe prox RCA s/p PTCa/DES, med rx for residual dz.  . Hyperlipidemia   . TIA (transient ischemic attack)     Multiple TIAs  . Cardiomyopathy, ischemic     a. LVEF 25-35%.  . Morbid obesity   . Mitral regurgitation     Mild  . BPH (benign prostatic hypertrophy)   . LBBB (left bundle branch block)     s/p BiV ICD Implanted by Dr Graciela Husbands  . Paroxysmal atrial fibrillation     a. Coumadin discontinue in 2010 when the patient required ASA/Plavix for stenting. b. Coumadin restarted 05/2013, Plavix continued, ASA stopped.   . Chronic kidney disease, stage III (moderate)     a. Followed by Dr. Fausto Skillern.  . HTN (hypertension)   . Anemia-chronic     a. Pt reports h/o anemia - was offered IV iron in past by nephrologist but declined and has taken PO instead.  . Aneurysm, cerebral   . MRSA (methicillin resistant Staphylococcus aureus)   . Anemia   . Pulmonary nodule     CXR, 03/2013, [facility]   PTA warfarin dose: 5mg /day  Assessment: 72 y/o M  presents with cough and chills. To start vancomycin per pharmacy, Zosyn per MD. Cefepime x 1 in the ED at 1920. WBC 15.5, Scr 1.78, Tmax 102.9, CXR unremarkable. INR on admit is 2.08, Hgb 10.7, no overt bleeding noted.    Goal of Therapy:  INR 2-3 Vancomycin Trough 15-20 mg/L Monitor platelets by anticoagulation protocol: Yes   Plan:  -Start vancomycin 2000 mg IV x 1, then 1500 mg IV q24h -Zosyn per MD -Trend WBC, temp, renal function -F/U cultures -Vancomycin levels as indicated  -Warfarin 5 mg PO x 1 tonight -Daily PT/INR -Monitor for bleeding  Thank you for allowing me to take part in this patient's care,  Abran Duke, PharmD Clinical Pharmacist Phone: [phone] Pager: [phone] 06/11/2013 9:09 PM

## 2013-06-11 NOTE — ED Notes (Signed)
Paged iv team 

## 2013-06-11 NOTE — ED Notes (Signed)
Pt. Stated, I've had chills hot and cold .  My BP has been really high and then it will get really low.  Ive had a cough for 3 weeks.

## 2013-06-11 NOTE — ED Notes (Signed)
Pt to Xray.

## 2013-06-11 NOTE — ED Notes (Signed)
Pt alert, NAD, calm, interactive, resps e/u, speaking in clear complete sentences, sitting in chair, wife at [facility], speaking with admitting MD at [facility], IVF infusing via pump, abx infusing.

## 2013-06-11 NOTE — ED Notes (Signed)
Notified EDP of pt's oral temp

## 2013-06-12 LAB — COMPREHENSIVE METABOLIC PANEL
AST: 27 U/L (ref 0–37)
Albumin: 3.2 g/dL — ABNORMAL LOW (ref 3.5–5.2)
Alkaline Phosphatase: 77 U/L (ref 39–117)
BUN: 34 mg/dL — ABNORMAL HIGH (ref 6–23)
Chloride: 95 mEq/L — ABNORMAL LOW (ref 96–112)
Potassium: 5.1 mEq/L (ref 3.5–5.1)
Sodium: 128 mEq/L — ABNORMAL LOW (ref 135–145)
Total Bilirubin: 0.5 mg/dL (ref 0.3–1.2)
Total Protein: 6.8 g/dL (ref 6.0–8.3)

## 2013-06-12 LAB — CBC
HCT: 29.9 % — ABNORMAL LOW (ref 39.0–52.0)
MCH: 25.8 pg — ABNORMAL LOW (ref 26.0–34.0)
MCV: 80.4 fL (ref 78.0–100.0)
RBC: 3.72 MIL/uL — ABNORMAL LOW (ref 4.22–5.81)
RDW: 18.4 % — ABNORMAL HIGH (ref 11.5–15.5)
WBC: 13.6 10*3/uL — ABNORMAL HIGH (ref 4.0–10.5)

## 2013-06-12 LAB — GLUCOSE, CAPILLARY: Glucose-Capillary: 163 mg/dL — ABNORMAL HIGH (ref 70–99)

## 2013-06-12 LAB — PROTIME-INR: INR: 1.84 — ABNORMAL HIGH (ref 0.00–1.49)

## 2013-06-12 MED ORDER — WARFARIN SODIUM 6 MG PO TABS
6.0000 mg | ORAL_TABLET | Freq: Once | ORAL | Status: AC
  Administered 2013-06-12: 6 mg via ORAL
  Filled 2013-06-12: qty 1

## 2013-06-12 MED ORDER — DIPHENHYDRAMINE HCL 25 MG PO CAPS
25.0000 mg | ORAL_CAPSULE | Freq: Three times a day (TID) | ORAL | Status: DC | PRN
  Administered 2013-06-12 – 2013-06-14 (×5): 25 mg via ORAL
  Filled 2013-06-12 (×5): qty 1

## 2013-06-12 MED ORDER — CHLORHEXIDINE GLUCONATE CLOTH 2 % EX PADS
6.0000 | MEDICATED_PAD | Freq: Every day | CUTANEOUS | Status: DC
  Administered 2013-06-13 – 2013-06-15 (×2): 6 via TOPICAL

## 2013-06-12 MED ORDER — SODIUM CHLORIDE 0.9 % IV SOLN
INTRAVENOUS | Status: DC
  Administered 2013-06-12: 14:00:00 via INTRAVENOUS

## 2013-06-12 MED ORDER — MUPIROCIN 2 % EX OINT
1.0000 "application " | TOPICAL_OINTMENT | Freq: Two times a day (BID) | CUTANEOUS | Status: DC
  Administered 2013-06-12 – 2013-06-15 (×8): 1 via NASAL
  Filled 2013-06-12: qty 22

## 2013-06-12 NOTE — Progress Notes (Signed)
ANTICOAGULATION CONSULT NOTE   Pharmacy Consult for  Warfarin Indication: PNA, Afib  Allergies  Allergen Reactions  . Codeine     REACTION: blisters, but able to take hydrocodone    Patient Measurements: Height: 5\' 8"  (172.7 cm) Weight: 243 lb 9.7 oz (110.5 kg) (scale B) IBW/kg (Calculated) : 68.4 Wt: 110.2kg  Vital Signs: Temp: 98.4 F (36.9 C) (07/28 0937) Temp src: Oral (07/28 0937) BP: 109/66 mmHg (07/28 0937) Pulse Rate: 61 (07/28 0937)  Labs:  Recent Labs  06/11/13 1708 06/11/13 1709 06/12/13 0510  HGB 10.7*  --  9.6*  HCT 33.7*  --  29.9*  PLT 262  --  228  APTT 58*  --   --   LABPROT 22.7*  --  20.7*  INR 2.08*  --  1.84*  CREATININE 1.78*  --  2.01*  TROPONINI  --  <0.30  --     Estimated Creatinine Clearance: 39.4 ml/min (by C-G formula based on Cr of 2.01).   Medical History: Past Medical History  Diagnosis Date  . Type 2 diabetes mellitus   . Coronary atherosclerosis of native coronary artery     a. DES to RCA 03/2009. b. s/p PTCA to RCA for ISR, 05/2010. c. 03/2013 NSTEMI 2/2 severe prox RCA s/p PTCa/DES, med rx for residual dz.  . Hyperlipidemia   . TIA (transient ischemic attack)     Multiple TIAs  . Cardiomyopathy, ischemic     a. LVEF 25-35%.  . Morbid obesity   . Mitral regurgitation     Mild  . BPH (benign prostatic hypertrophy)   . LBBB (left bundle branch block)     s/p BiV ICD Implanted by Dr Graciela Husbands  . Paroxysmal atrial fibrillation     a. Coumadin discontinue in 2010 when the patient required ASA/Plavix for stenting. b. Coumadin restarted 05/2013, Plavix continued, ASA stopped.   . Chronic kidney disease, stage III (moderate)     a. Followed by Dr. Fausto Skillern.  . HTN (hypertension)   . Anemia-chronic     a. Pt reports h/o anemia - was offered IV iron in past by nephrologist but declined and has taken PO instead.  . Aneurysm, cerebral   . MRSA (methicillin resistant Staphylococcus aureus)   . Anemia   . Pulmonary nodule     CXR,  03/2013, [facility]   PTA warfarin dose: 5mg /day  Assessment: 72 y/o M presents with cough and chills. Per RN he had a red man type reaction last night, resolved with benadryl. INR today 1.84    Goal of Therapy:  INR 2-3 Vancomycin Trough 15-20 mg/L Monitor platelets by anticoagulation protocol: Yes   Plan:  -Slow infusion rate of vanc -Warfarin 6 mg PO x 1 tonight -Daily PT/INR -Monitor for bleeding  Thank you for allowing me to take part in this patient's care,  Talbert Cage, PharmD Clinical Pharmacist Phone: [phone] 06/12/2013 10:42 AM

## 2013-06-12 NOTE — Progress Notes (Signed)
CRITICAL VALUE ALERT  Critical value received:  Positive mrsa swab  Date of notification:  06/12/13  Time of notification:  0008  Critical value read back:yes  Nurse who received alert:  Courtney Heys, rn  MD notified (1st page):    Time of first page:    MD notified (2nd page):  Time of second page:  Responding MD:    Time MD responded:

## 2013-06-12 NOTE — Progress Notes (Signed)
Pt having chills oral temp was 98.0 vs stable. Md paged, extra blankets giving.  Iv NS 75 on hold d/t iv pump unable to run Zoysn @12 .5 n NS 75. Will restart NS once zosyn complete.

## 2013-06-12 NOTE — Progress Notes (Signed)
Hypoglycemic Event  CBG: 63  Treatment: 15 GM carbohydrate snack OJ  Symptoms: None  Follow-up CBG: Time:1230 CBG Result: 96  Possible Reasons for Event: Inadequate meal intake  Comments/MD notified: Pt did not eat breakfast after rec. AM insulin.  Will continue to monitor     [name], [place] Stage  Remember to initiate Hypoglycemia Order Set & complete

## 2013-06-12 NOTE — Progress Notes (Signed)
Pt. Stated he felt like he was getting a fever again.  Temperature 101.4  Tylenol was given as scheduled at 2203.  Lenny Pastel, NP made aware.  No new orders received.  Will continue to monitor patient.

## 2013-06-12 NOTE — Progress Notes (Signed)
[facility] PROGRESS NOTE  TEJAY HUBERT GNF:[id number] DOB: Nov 19, 1940 DOA: 06/11/2013 PCP: [name], MD  Assessment/Plan: Principal Problem:   SIRS (systemic inflammatory response syndrome) Active Problems:   Essential hypertension, benign   CAD, NATIVE VESSEL   HTN (hypertension)   Paroxysmal atrial fibrillation   Acute on chronic kidney disease, stage 3   1) SIRS: At this time DDx includes HCAP and UTI causing his fever. Due to his recent hospitalization and co morbidities as well as MRSA colonization, I will start him on Vancomycin per pharmacy and Zosyn. BC have been sent as well as UC. He will be monitored on telemetry   2) CKD stage 3, possibly component of AKI: This may have been from overdiuresis. He has been above the his baseline for over a month. But given his EF and his euvolemic state,start ivf  3) PAfib: Cont amiodarone and coumadin per pharmacy. He is therapeutic.  4) IDDM type 2: Insulin ordered, BG will be monitored.  5) HTN: hemodynamics stable. Home meds.  6) Proph: no lovenox, he is on coumadin.     Code Status: full Family Communication: family updated about patient's clinical progress Disposition Plan:  As above    Brief narrative: 72 yr. Old WM w/ hx PAF on coumadin and amiodarone, s/p BiV, hx NSTEMI with PCI and DES placement 04/2013, HFrEF, iron deficiency anemia, CKD stage 3 (Cr 1.3), HL, HTN, presents due to chills and cough. He states he had an appointment with his nephrologist 2 days ago (routine) and was told that his BP was low (SBP 90's). As a result, one of his BP meds was decreased in dosage, which helped. Yesterday he states he started to have a cough with productive clear sputum and significant chills. His wife states he's had a fever, and he typically does not have fevers like this. Currently, he denies any SOB or CP. He states he has had increased urinary frequency but no dysuria. He denies diarrhea. He denies abdominal pain. He denies  any change in LE swelling. He denies HA's.  In the ED, he was found to have a Temp of 102.9 F. WBC noted to be 15k with a neutrophil predominance, but no bandemia. CXR does not show a definite infiltrate. UA showed mod LE and 21-50 WBCs with many bacteria, but negative nitrites.  The ED has ordered 1 g IV Cefepime.   Consultants:  None   Procedures:  None     Antibiotics:  Zosyn,vamcomycin'  HPI/Subjective:    Objective: Filed Vitals:   06/11/13 2303 06/12/13 0137 06/12/13 0607 06/12/13 0937  BP:  131/70 146/72 109/66  Pulse:  67 84 61  Temp: 101.4 F (38.6 C) 99 F (37.2 C) 102.8 F (39.3 C) 98.4 F (36.9 C)  TempSrc: Oral Oral Oral Oral  Resp:  18 19 19   Height:      Weight:   110.5 kg (243 lb 9.7 oz)   SpO2:  94% 90% 93%    Intake/Output Summary (Last 24 hours) at 06/12/13 1308 Last data filed at 06/12/13 1200  Gross per 24 hour  Intake   1823 ml  Output    100 ml  Net   1723 ml    Exam:  Cardiovascular: Normal rate, regular rhythm, normal heart sounds and intact distal pulses. Exam reveals no gallop and no friction rub.  No murmur heard.  Respiratory: Effort normal and breath sounds normal. He has no wheezes. He has no rales.  GI: Soft. Bowel sounds are normal.  He exhibits no distension. There is no tenderness. There is no rebound and no guarding.  Musculoskeletal: Normal range of motion. He exhibits edema.  Neurological: He is alert and oriented to person, place, and time. He has normal reflexes. No cranial nerve deficit.  Skin: Skin is warm. He is not diaphoretic.  Psychiatric: He has a normal mood and affect. His behavior is normal.       Data Reviewed: Basic Metabolic Panel:  Recent Labs Lab 06/11/13 1708 06/12/13 0510  NA 129* 128*  K 5.1 5.1  CL 93* 95*  CO2 23 21  GLUCOSE 171* 157*  BUN 30* 34*  CREATININE 1.78* 2.01*  CALCIUM 9.2 8.5    Liver Function Tests:  Recent Labs Lab 06/11/13 1708 06/12/13 0510  AST 30 27  ALT  16 15  ALKPHOS 91 77  BILITOT 0.5 0.5  PROT 7.5 6.8  ALBUMIN 3.5 3.2*   No results found for this basename: LIPASE, AMYLASE,  in the last 168 hours No results found for this basename: AMMONIA,  in the last 168 hours  CBC:  Recent Labs Lab 06/11/13 1708 06/12/13 0510  WBC 15.5* 13.6*  NEUTROABS 13.7*  --   HGB 10.7* 9.6*  HCT 33.7* 29.9*  MCV 80.8 80.4  PLT 262 228    Cardiac Enzymes:  Recent Labs Lab 06/11/13 1709  TROPONINI <0.30   BNP (last 3 results)  Recent Labs  04/14/13 1721 05/26/13 1220  PROBNP 1020.0* 2644.0*     CBG:  Recent Labs Lab 06/11/13 2057 06/12/13 0547 06/12/13 1152 06/12/13 1245  GLUCAP 179* 163* 63* 91    Recent Results (from the past 240 hour(s))  MRSA PCR SCREENING     Status: Abnormal   Collection Time    06/11/13  9:06 PM      Result Value Range Status   MRSA by PCR POSITIVE (*) NEGATIVE Final   Comment:            The GeneXpert MRSA Assay (FDA     approved for NASAL specimens     only), is one component of a     comprehensive MRSA colonization     surveillance program. It is not     intended to diagnose MRSA     infection nor to guide or     monitor treatment for     MRSA infections.     RESULT CALLED TO, READ BACK BY AND VERIFIED WITH:     MUELLER,L RN AT 0006 06/12/13 MITCHELL,L     Studies: Dg Chest 2 View  06/11/2013   *RADIOLOGY REPORT*  Clinical Data: Chest pain  CHEST - 2 VIEW  Comparison:  September 26, 2013  Findings:  There is no edema or consolidation.  Heart is borderline enlarged with normal pulmonary vascularity.  Pacemaker leads are attached to the right atrium and right ventricle.  No adenopathy. No pneumothorax.  There is degenerative change in the thoracic spine.  IMPRESSION: No edema or consolidation.  Stable borderline cardiomegaly.   Original Report Authenticated By: Bretta Bang, M.D.   Dg Chest 2 View  05/26/2013   *RADIOLOGY REPORT*  Clinical Data: Mid chest pain, shortness of breath, rapid  heart rate  CHEST - 2 VIEW  Comparison: Chest x-ray of 05/12/2013  Findings: No active infiltrate or effusion is seen.  There is cardiomegaly present and may be minimal pulmonary vascular congestion present.  Defibrillator leads are noted.  There are degenerative changes throughout the thoracic spine.  IMPRESSION: Cardiomegaly.  Question minimal pulmonary vascular congestion. Defibrillator leads are noted.   Original Report Authenticated By: Dwyane Dee, M.D.    Scheduled Meds: . acetaminophen  1,000 mg Oral BID  . amiodarone  200 mg Oral Daily  . carvedilol  12.5 mg Oral BID WC  . Chlorhexidine Gluconate Cloth  6 each Topical Q0600  . clopidogrel  75 mg Oral Daily  . ferrous sulfate  325 mg Oral QPM  . hydrALAZINE  25 mg Oral TID  . insulin aspart  0-15 Units Subcutaneous TID WC  . insulin aspart  0-5 Units Subcutaneous QHS  . insulin aspart protamine- aspart  30 Units Subcutaneous BID WC  . isosorbide mononitrate  30 mg Oral Daily  . l-methylfolate-B6-B12  1 tablet Oral Daily  . mupirocin ointment  1 application Nasal BID  . piperacillin-tazobactam (ZOSYN)  IV  3.375 g Intravenous Q8H  . pyridOXINE  50 mg Oral Daily  . sodium chloride  3 mL Intravenous Q12H  . vancomycin  1,500 mg Intravenous Q24H  . warfarin  6 mg Oral ONCE-1800  . Warfarin - Pharmacist Dosing Inpatient   Does not apply q1800  . zinc sulfate  220 mg Oral Daily   Continuous Infusions:   Principal Problem:   SIRS (systemic inflammatory response syndrome) Active Problems:   Essential hypertension, benign   CAD, NATIVE VESSEL   HTN (hypertension)   Paroxysmal atrial fibrillation   Acute on chronic kidney disease, stage 3    Time spent: 40 minutes   [facility]  [facility] Pager [phone]. If 8PM-8AM, please contact night-coverage at [email or web address], password [facility] 06/12/2013, 1:08 PM  LOS: 1 day

## 2013-06-12 NOTE — Progress Notes (Signed)
Pt states pain when urinating is decreasing. Pt had chills on/off during day.

## 2013-06-13 ENCOUNTER — Inpatient Hospital Stay (HOSPITAL_COMMUNITY): Payer: Medicare Other

## 2013-06-13 LAB — CBC
HCT: 31.4 % — ABNORMAL LOW (ref 39.0–52.0)
Hemoglobin: 10.1 g/dL — ABNORMAL LOW (ref 13.0–17.0)
MCHC: 32.2 g/dL (ref 30.0–36.0)
MCV: 80.5 fL (ref 78.0–100.0)
RDW: 18.6 % — ABNORMAL HIGH (ref 11.5–15.5)

## 2013-06-13 LAB — GLUCOSE, CAPILLARY
Glucose-Capillary: 169 mg/dL — ABNORMAL HIGH (ref 70–99)
Glucose-Capillary: 198 mg/dL — ABNORMAL HIGH (ref 70–99)
Glucose-Capillary: 83 mg/dL (ref 70–99)
Glucose-Capillary: 118 mg/dL — ABNORMAL HIGH (ref 70–99)

## 2013-06-13 LAB — BASIC METABOLIC PANEL
BUN: 33 mg/dL — ABNORMAL HIGH (ref 6–23)
CO2: 23 mEq/L (ref 19–32)
Chloride: 98 mEq/L (ref 96–112)
Creatinine, Ser: 1.88 mg/dL — ABNORMAL HIGH (ref 0.50–1.35)
Potassium: 4.7 mEq/L (ref 3.5–5.1)

## 2013-06-13 LAB — URINE CULTURE: Colony Count: 70000

## 2013-06-13 MED ORDER — WARFARIN SODIUM 7.5 MG PO TABS
7.5000 mg | ORAL_TABLET | Freq: Once | ORAL | Status: AC
  Administered 2013-06-13: 7.5 mg via ORAL
  Filled 2013-06-13: qty 1

## 2013-06-13 MED ORDER — SODIUM CHLORIDE 0.9 % IV SOLN
INTRAVENOUS | Status: AC
  Administered 2013-06-13: 12:00:00 via INTRAVENOUS

## 2013-06-13 NOTE — Progress Notes (Addendum)
[facility] PROGRESS NOTE  [name]:[id number] DOB: 07-25-1941 DOA: 06/11/2013 PCP: [name], MD  Assessment/Plan: Principal Problem:   SIRS (systemic inflammatory response syndrome) Active Problems:   Essential hypertension, benign   CAD, NATIVE VESSEL   HTN (hypertension)   Paroxysmal atrial fibrillation   Acute on chronic kidney disease, stage 3    1) SIRS: At this time DDx includes HCAP vs  UTI causing his fever. Due to his recent hospitalization and co morbidities as well as MRSA colonization, started him on Vancomycin per pharmacy and Zosyn. BC have been sent as well as UC, urine culture shows Escherichia coli greater than 70,000 colonies, and repeat chest x-ray today, He will be monitored on telemetry   2) CKD stage 3, possibly component of AKI: This may have been from overdiuresis. He has been above the his baseline for over a month. But given his EF and his euvolemic state, continue IVF, CT urogram to rule out nephrolithiasis, hydronephrosis   3) PAfib: Cont amiodarone and coumadin per pharmacy. He is therapeutic.  4) IDDM type 2: Insulin ordered, BG will be monitored.  5) HTN: hemodynamics stable. Home meds.  6) Proph: no lovenox, he is on coumadin.  7. history of chronic systolic heart failure, last 2-D echo was done 04/15/13 with an EF of 35-40% he is followed by Dr. Patty Sermons, on amiodarone, Coreg, hydralazine, Imdur, Demadex at home. Demadex on hold given increase in his creatinine    Code Status: full  Family Communication: family updated about patient's clinical progress  Disposition Plan: As above    Brief narrative:  72 yr. Old WM w/ hx PAF on coumadin and amiodarone, s/p BiV, hx NSTEMI with PCI and DES placement 04/2013, HFrEF, iron deficiency anemia, CKD stage 3 (Cr 1.3), HL, HTN, presents due to chills and cough. He states he had an appointment with his nephrologist 2 days ago (routine) and was told that his BP was low (SBP 90's). As a result,  one of his BP meds was decreased in dosage, which helped. Yesterday he states he started to have a cough with productive clear sputum and significant chills. His wife states he's had a fever, and he typically does not have fevers like this. Currently, he denies any SOB or CP. He states he has had increased urinary frequency but no dysuria. He denies diarrhea. He denies abdominal pain. He denies any change in LE swelling. He denies HA's.  In the ED, he was found to have a Temp of 102.9 F. WBC noted to be 15k with a neutrophil predominance, but no bandemia. CXR does not show a definite infiltrate. UA showed mod LE and 21-50 WBCs with many bacteria, but negative nitrites.  The ED has ordered 1 g IV Cefepime.  Consultants:  None  Procedures:  None  Antibiotics:  Zosyn,vamcomycin'     HPI/Subjective: Febrile last night with a temperature of 102, chills,  Objective: Filed Vitals:   06/12/13 1648 06/12/13 2044 06/13/13 0501 06/13/13 0652  BP: 134/62 147/65 163/67 154/68  Pulse: 71 79 67 62  Temp: 98 F (36.7 C) 102 F (38.9 C) 98.1 F (36.7 C)   TempSrc: Oral Oral Oral   Resp:  18 20   Height:      Weight:   111.4 kg (245 lb 9.5 oz)   SpO2:  96% 99%     Intake/Output Summary (Last 24 hours) at 06/13/13 1014 Last data filed at 06/13/13 0829  Gross per 24 hour  Intake  2400 ml  Output   2425 ml  Net    -25 ml    Exam:  HENT:  Head: Atraumatic.  Nose: Nose normal.  Mouth/Throat: Oropharynx is clear and moist.  Eyes: Conjunctivae are normal. Pupils are equal, round, and reactive to light. No scleral icterus.  Neck: Neck supple. No tracheal deviation present.  Cardiovascular: Normal rate, regular rhythm, normal heart sounds and intact distal pulses.  Pulmonary/Chest: Effort normal and breath sounds normal. No respiratory distress.  Abdominal: Soft. Normal appearance and bowel sounds are normal. She exhibits no distension. There is no tenderness.  Musculoskeletal: She exhibits  no edema and no tenderness.  Neurological: She is alert. No cranial nerve deficit.    Data Reviewed: Basic Metabolic Panel:  Recent Labs Lab 06/11/13 1708 06/12/13 0510 06/13/13 0500  NA 129* 128* 131*  K 5.1 5.1 4.7  CL 93* 95* 98  CO2 23 21 23   GLUCOSE 171* 157* 145*  BUN 30* 34* 33*  CREATININE 1.78* 2.01* 1.88*  CALCIUM 9.2 8.5 8.9    Liver Function Tests:  Recent Labs Lab 06/11/13 1708 06/12/13 0510  AST 30 27  ALT 16 15  ALKPHOS 91 77  BILITOT 0.5 0.5  PROT 7.5 6.8  ALBUMIN 3.5 3.2*   No results found for this basename: LIPASE, AMYLASE,  in the last 168 hours No results found for this basename: AMMONIA,  in the last 168 hours  CBC:  Recent Labs Lab 06/11/13 1708 06/12/13 0510 06/13/13 0500  WBC 15.5* 13.6* 7.5  NEUTROABS 13.7*  --   --   HGB 10.7* 9.6* 10.1*  HCT 33.7* 29.9* 31.4*  MCV 80.8 80.4 80.5  PLT 262 228 218    Cardiac Enzymes:  Recent Labs Lab 06/11/13 1709  TROPONINI <0.30   BNP (last 3 results)  Recent Labs  04/14/13 1721 05/26/13 1220  PROBNP 1020.0* 2644.0*     CBG:  Recent Labs Lab 06/12/13 1152 06/12/13 1245 06/12/13 1618 06/12/13 2155 06/13/13 0631  GLUCAP 63* 91 110* 158* 169*    Recent Results (from the past 240 hour(s))  URINE CULTURE     Status: None   Collection Time    06/11/13  4:03 PM      Result Value Range Status   Specimen Description URINE, CLEAN CATCH   Final   Special Requests NONE   Final   Culture  Setup Time 06/12/2013 02:18   Final   Colony Count 70,000 COLONIES/ML   Final   Culture ESCHERICHIA COLI   Final   Report Status PENDING   Incomplete  CULTURE, BLOOD (ROUTINE X 2)     Status: None   Collection Time    06/11/13  4:40 PM      Result Value Range Status   Specimen Description BLOOD RIGHT HAND   Final   Special Requests BOTTLES DRAWN AEROBIC ONLY 5CC   Final   Culture  Setup Time 06/12/2013 01:48   Final   Culture     Final   Value:        BLOOD CULTURE RECEIVED NO GROWTH  TO DATE CULTURE WILL BE HELD FOR 5 DAYS BEFORE ISSUING A FINAL NEGATIVE REPORT   Report Status PENDING   Incomplete  CULTURE, BLOOD (ROUTINE X 2)     Status: None   Collection Time    06/11/13  4:50 PM      Result Value Range Status   Specimen Description BLOOD RIGHT HAND   Final   Special Requests  BOTTLES DRAWN AEROBIC ONLY 10CC   Final   Culture  Setup Time 06/12/2013 01:48   Final   Culture     Final   Value:        BLOOD CULTURE RECEIVED NO GROWTH TO DATE CULTURE WILL BE HELD FOR 5 DAYS BEFORE ISSUING A FINAL NEGATIVE REPORT   Report Status PENDING   Incomplete  MRSA PCR SCREENING     Status: Abnormal   Collection Time    06/11/13  9:06 PM      Result Value Range Status   MRSA by PCR POSITIVE (*) NEGATIVE Final   Comment:            The GeneXpert MRSA Assay (FDA     approved for NASAL specimens     only), is one component of a     comprehensive MRSA colonization     surveillance program. It is not     intended to diagnose MRSA     infection nor to guide or     monitor treatment for     MRSA infections.     RESULT CALLED TO, READ BACK BY AND VERIFIED WITH:     MUELLER,L RN AT 0006 06/12/13 MITCHELL,L     Studies: Dg Chest 2 View  06/11/2013   *RADIOLOGY REPORT*  Clinical Data: Chest pain  CHEST - 2 VIEW  Comparison:  September 26, 2013  Findings:  There is no edema or consolidation.  Heart is borderline enlarged with normal pulmonary vascularity.  Pacemaker leads are attached to the right atrium and right ventricle.  No adenopathy. No pneumothorax.  There is degenerative change in the thoracic spine.  IMPRESSION: No edema or consolidation.  Stable borderline cardiomegaly.   Original Report Authenticated By: Bretta Bang, M.D.   Dg Chest 2 View  05/26/2013   *RADIOLOGY REPORT*  Clinical Data: Mid chest pain, shortness of breath, rapid heart rate  CHEST - 2 VIEW  Comparison: Chest x-ray of 05/12/2013  Findings: No active infiltrate or effusion is seen.  There is cardiomegaly  present and may be minimal pulmonary vascular congestion present.  Defibrillator leads are noted.  There are degenerative changes throughout the thoracic spine.  IMPRESSION: Cardiomegaly.  Question minimal pulmonary vascular congestion. Defibrillator leads are noted.   Original Report Authenticated By: Dwyane Dee, M.D.    Scheduled Meds: . acetaminophen  1,000 mg Oral BID  . amiodarone  200 mg Oral Daily  . carvedilol  12.5 mg Oral BID WC  . Chlorhexidine Gluconate Cloth  6 each Topical Q0600  . clopidogrel  75 mg Oral Daily  . ferrous sulfate  325 mg Oral QPM  . hydrALAZINE  25 mg Oral TID  . insulin aspart  0-15 Units Subcutaneous TID WC  . insulin aspart  0-5 Units Subcutaneous QHS  . insulin aspart protamine- aspart  30 Units Subcutaneous BID WC  . isosorbide mononitrate  30 mg Oral Daily  . l-methylfolate-B6-B12  1 tablet Oral Daily  . mupirocin ointment  1 application Nasal BID  . piperacillin-tazobactam (ZOSYN)  IV  3.375 g Intravenous Q8H  . pyridOXINE  50 mg Oral Daily  . sodium chloride  3 mL Intravenous Q12H  . vancomycin  1,500 mg Intravenous Q24H  . Warfarin - Pharmacist Dosing Inpatient   Does not apply q1800  . zinc sulfate  220 mg Oral Daily   Continuous Infusions:   Principal Problem:   SIRS (systemic inflammatory response syndrome) Active Problems:   Essential hypertension, benign  CAD, NATIVE VESSEL   HTN (hypertension)   Paroxysmal atrial fibrillation   Acute on chronic kidney disease, stage 3    Time spent: 40 minutes   [facility]  [facility] Pager [phone]. If 8PM-8AM, please contact night-coverage at [email or web address], password [facility] 06/13/2013, 10:14 AM  LOS: 2 days

## 2013-06-13 NOTE — Progress Notes (Signed)
Patient evaluated for [facility] Care Management services. Explained [facility] Care Management services at bedside to Mr Mcgurn. However, he pleasantly declines.  Raiford Noble, MSN-Ed, RN,BSN- [facility] Liaison-[phone]

## 2013-06-13 NOTE — Progress Notes (Signed)
ANTICOAGULATION CONSULT NOTE   Pharmacy Consult for  Warfarin Indication: PNA, Afib  Allergies  Allergen Reactions  . Codeine     REACTION: blisters, but able to take hydrocodone    Patient Measurements: Height: 5\' 8"  (172.7 cm) Weight: 245 lb 9.5 oz (111.4 kg) IBW/kg (Calculated) : 68.4 Wt: 110.2kg  Vital Signs: Temp: 98.9 F (37.2 C) (07/29 1021) Temp src: Oral (07/29 1021) BP: 121/68 mmHg (07/29 1021) Pulse Rate: 64 (07/29 1021)  Labs:  Recent Labs  06/11/13 1708 06/11/13 1709 06/12/13 0510 06/13/13 0500  HGB 10.7*  --  9.6* 10.1*  HCT 33.7*  --  29.9* 31.4*  PLT 262  --  228 218  APTT 58*  --   --   --   LABPROT 22.7*  --  20.7* 20.7*  INR 2.08*  --  1.84* 1.84*  CREATININE 1.78*  --  2.01* 1.88*  TROPONINI  --  <0.30  --   --     Estimated Creatinine Clearance: 42.4 ml/min (by C-G formula based on Cr of 1.88).   Medical History: Past Medical History  Diagnosis Date  . Type 2 diabetes mellitus   . Coronary atherosclerosis of native coronary artery     a. DES to RCA 03/2009. b. s/p PTCA to RCA for ISR, 05/2010. c. 03/2013 NSTEMI 2/2 severe prox RCA s/p PTCa/DES, med rx for residual dz.  . Hyperlipidemia   . TIA (transient ischemic attack)     Multiple TIAs  . Cardiomyopathy, ischemic     a. LVEF 25-35%.  . Morbid obesity   . Mitral regurgitation     Mild  . BPH (benign prostatic hypertrophy)   . LBBB (left bundle branch block)     s/p BiV ICD Implanted by Dr Graciela Husbands  . Paroxysmal atrial fibrillation     a. Coumadin discontinue in 2010 when the patient required ASA/Plavix for stenting. b. Coumadin restarted 05/2013, Plavix continued, ASA stopped.   . Chronic kidney disease, stage III (moderate)     a. Followed by Dr. Fausto Skillern.  . HTN (hypertension)   . Anemia-chronic     a. Pt reports h/o anemia - was offered IV iron in past by nephrologist but declined and has taken PO instead.  . Aneurysm, cerebral   . MRSA (methicillin resistant Staphylococcus  aureus)   . Anemia   . Pulmonary nodule     CXR, 03/2013, [facility]   PTA warfarin dose: 5mg /day  Assessment: 72 y/o M presents with cough and chills. Per RN he had a red man type reaction with vancomycin dose last night, resolved with benadryl. INR today 1.84 after Coumadin 6mg  last pm CBC stable, no bleeding noted.   Goal of Therapy:  INR 2-3 Vancomycin Trough 15-20 mg/L Monitor platelets by anticoagulation protocol: Yes   Plan:  -Slow infusion rate of vanc -Warfarin 7.5 mg PO x 1 tonight -Daily PT/INR -Monitor for bleeding  Leota Sauers Pharm.D. CPP, BCPS Clinical Pharmacist [phone] 06/13/2013 12:09 PM

## 2013-06-14 DIAGNOSIS — E119 Type 2 diabetes mellitus without complications: Secondary | ICD-10-CM

## 2013-06-14 DIAGNOSIS — I10 Essential (primary) hypertension: Secondary | ICD-10-CM

## 2013-06-14 DIAGNOSIS — I4891 Unspecified atrial fibrillation: Secondary | ICD-10-CM

## 2013-06-14 LAB — GLUCOSE, CAPILLARY

## 2013-06-14 LAB — PROTIME-INR: Prothrombin Time: 26.9 seconds — ABNORMAL HIGH (ref 11.6–15.2)

## 2013-06-14 MED ORDER — WARFARIN SODIUM 4 MG PO TABS
4.0000 mg | ORAL_TABLET | Freq: Once | ORAL | Status: AC
  Administered 2013-06-14: 4 mg via ORAL
  Filled 2013-06-14: qty 1

## 2013-06-14 MED ORDER — NITROFURANTOIN MONOHYD MACRO 100 MG PO CAPS
100.0000 mg | ORAL_CAPSULE | Freq: Two times a day (BID) | ORAL | Status: DC
  Administered 2013-06-15 (×2): 100 mg via ORAL
  Filled 2013-06-14 (×3): qty 1

## 2013-06-14 NOTE — Progress Notes (Signed)
[facility] PROGRESS NOTE  JAMARIUS SAHA AOZ:[id number] DOB: Mar 27, 1941 DOA: 06/11/2013 PCP: [name], MD  Assessment/Plan: 1) SIRS: DC vancomycin, Zosyn, start nitrofurantoin   2) CKD stage 3, patient's creatinine back to baseline. Ready for discharge 3) PAfib: Cont amiodarone and coumadin per pharmacy. He is therapeutic.  4) IDDM type 2: Insulin ordered, BG will be monitored.  5) HTN: hemodynamics stable. Home meds.  6) Proph: no lovenox, he is on coumadin.  7.Hx CHF systolic heart failure, last 2-D echo was done 04/15/13 with an EF of 35-40% he is followed by Dr. Patty Sermons, on amiodarone, Coreg, hydralazine, Imdur, Demadex at home. Demadex on hold given increase in his creatinine  Code Status: Full Disposition Plan: DC in a.m.   Procedures:  CT abdomen and pelvis without contrast 06/13/2013 1. No acute abnormalities involving the abdomen or pelvis.  2. Diffuse cortical thinning involving both kidneys. 2 cm simple  cyst arising from the lower pole of the left kidney. No  significant focal hepatic parenchymal abnormalities. No urinary  tract calculi.  3. Scattered sigmoid colon diverticula without evidence of acute  diverticulitis.  4. Small hiatal hernia.  5. Bilateral inguinal hernias containing fat, large on the left  and moderate sized on the right.  6. Left adrenal nodule statistically consistent with adenoma   HPI/Subjective: 72 yr. Old WM PMHx  PAF on coumadin and amiodarone, s/p BiV, hx NSTEMI with PCI and DES placement 04/2013, lymphoma (per patient), iron deficiency anemia, CKD stage 3 (Cr 1.3), HL, HTN, presents due to chills and cough. He states he had an appointment with his nephrologist 2 days ago (routine) and was told that his BP was low (SBP 90's). As a result, one of his BP meds was decreased in dosage, which helped. Yesterday he states he started to have a cough with productive clear sputum and significant chills. His wife states he's had a fever, and he  typically does not have fevers like this. Currently, he denies any SOB or CP. He states he has had increased urinary frequency but no dysuria. He denies diarrhea. He denies abdominal pain. He denies any change in LE swelling. He denies HA's.  In the ED, he was found to have a Temp of 102.9 F. WBC noted to be 15k with a neutrophil predominance, but no bandemia. CXR does not show a definite infiltrate. UA showed Escherichia coli   Procedure CT abdomen pelvis without contrast 06/13/2013   1. No acute abnormalities involving the abdomen or pelvis.  2. Diffuse cortical thinning involving both kidneys. 2 cm simple  cyst arising from the lower pole of the left kidney. No  significant focal hepatic parenchymal abnormalities. No urinary  tract calculi.  3. Scattered sigmoid colon diverticula without evidence of acute  diverticulitis.  4. Small hiatal hernia.  5. Bilateral inguinal hernias containing fat, large on the left  and moderate sized on the right.  6. Left adrenal nodule statistically consistent with adenoma  MRSA positive   Objective: Filed Vitals:   06/14/13 1056 06/14/13 1300 06/14/13 1707 06/14/13 2124  BP: 145/72 132/58 163/68 156/63  Pulse: 56 57 53 57  Temp: 97.1 F (36.2 C) 97.9 F (36.6 C)  98.1 F (36.7 C)  TempSrc: Oral Oral  Oral  Resp: 18 20  18   Height:      Weight:      SpO2: 98% 96%  98%    Intake/Output Summary (Last 24 hours) at 06/14/13 2254 Last data filed at 06/14/13 1813  Gross  per 24 hour  Intake    650 ml  Output   1275 ml  Net   -625 ml   Filed Weights   06/12/13 0607 06/13/13 0501 06/14/13 0400  Weight: 110.5 kg (243 lb 9.7 oz) 111.4 kg (245 lb 9.5 oz) 111.721 kg (246 lb 4.8 oz)    Exam:   General:  Alert,NAD  Cardiovascular: Regular rate, negative murmurs rubs or gallops, DP/PT pulse 2+ bilateral  Respiratory: Good auscultation bilateral  Abdomen:  Soft nontender nondistended plus bowel sounds   Data Reviewed: Basic Metabolic  Panel:  Recent Labs Lab 06/11/13 1708 06/12/13 0510 06/13/13 0500  NA 129* 128* 131*  K 5.1 5.1 4.7  CL 93* 95* 98  CO2 23 21 23   GLUCOSE 171* 157* 145*  BUN 30* 34* 33*  CREATININE 1.78* 2.01* 1.88*  CALCIUM 9.2 8.5 8.9   Liver Function Tests:  Recent Labs Lab 06/11/13 1708 06/12/13 0510  AST 30 27  ALT 16 15  ALKPHOS 91 77  BILITOT 0.5 0.5  PROT 7.5 6.8  ALBUMIN 3.5 3.2*   No results found for this basename: LIPASE, AMYLASE,  in the last 168 hours No results found for this basename: AMMONIA,  in the last 168 hours CBC:  Recent Labs Lab 06/11/13 1708 06/12/13 0510 06/13/13 0500  WBC 15.5* 13.6* 7.5  NEUTROABS 13.7*  --   --   HGB 10.7* 9.6* 10.1*  HCT 33.7* 29.9* 31.4*  MCV 80.8 80.4 80.5  PLT 262 228 218   Cardiac Enzymes:  Recent Labs Lab 06/11/13 1709 06/13/13 1105  TROPONINI <0.30 <0.30   BNP (last 3 results)  Recent Labs  04/14/13 1721 05/26/13 1220  PROBNP 1020.0* 2644.0*   CBG:  Recent Labs Lab 06/14/13 0654 06/14/13 1120 06/14/13 1525 06/14/13 1711 06/14/13 2116  GLUCAP 144* 89 85 136* 118*    Recent Results (from the past 240 hour(s))  URINE CULTURE     Status: None   Collection Time    06/11/13  4:03 PM      Result Value Range Status   Specimen Description URINE, CLEAN CATCH   Final   Special Requests NONE   Final   Culture  Setup Time 06/12/2013 02:18   Final   Colony Count 70,000 COLONIES/ML   Final   Culture ESCHERICHIA COLI   Final   Report Status 06/13/2013 FINAL   Final   Organism ID, Bacteria ESCHERICHIA COLI   Final  CULTURE, BLOOD (ROUTINE X 2)     Status: None   Collection Time    06/11/13  4:40 PM      Result Value Range Status   Specimen Description BLOOD RIGHT HAND   Final   Special Requests BOTTLES DRAWN AEROBIC ONLY 5CC   Final   Culture  Setup Time 06/12/2013 01:48   Final   Culture     Final   Value:        BLOOD CULTURE RECEIVED NO GROWTH TO DATE CULTURE WILL BE HELD FOR 5 DAYS BEFORE ISSUING A  FINAL NEGATIVE REPORT   Report Status PENDING   Incomplete  CULTURE, BLOOD (ROUTINE X 2)     Status: None   Collection Time    06/11/13  4:50 PM      Result Value Range Status   Specimen Description BLOOD RIGHT HAND   Final   Special Requests BOTTLES DRAWN AEROBIC ONLY 10CC   Final   Culture  Setup Time 06/12/2013 01:48   Final  Culture     Final   Value:        BLOOD CULTURE RECEIVED NO GROWTH TO DATE CULTURE WILL BE HELD FOR 5 DAYS BEFORE ISSUING A FINAL NEGATIVE REPORT   Report Status PENDING   Incomplete  MRSA PCR SCREENING     Status: Abnormal   Collection Time    06/11/13  9:06 PM      Result Value Range Status   MRSA by PCR POSITIVE (*) NEGATIVE Final   Comment:            The GeneXpert MRSA Assay (FDA     approved for NASAL specimens     only), is one component of a     comprehensive MRSA colonization     surveillance program. It is not     intended to diagnose MRSA     infection nor to guide or     monitor treatment for     MRSA infections.     RESULT CALLED TO, READ BACK BY AND VERIFIED WITH:     MUELLER,L RN AT 0006 06/12/13 MITCHELL,L     Studies: Ct Abdomen Pelvis Wo Contrast  06/13/2013   *RADIOLOGY REPORT*  Clinical Data: Unexplained recurrent urinary tract infections. Surgical history includes cholecystectomy.  Chronic kidney disease, stage III.  CT ABDOMEN AND PELVIS WITHOUT CONTRAST  Technique:  Multidetector CT imaging of the abdomen and pelvis was performed following the standard protocol without intravenous contrast.  Comparison: None.  Findings: Mild diffuse cortical thinning involving both kidneys. No hydronephrosis.  Approximate 2 cm simple cyst arising from the lower pole of the left kidney.  Within the limits of the unenhanced technique, no significant focal parenchymal abnormality involving either kidney.  No urinary tract calculi on either side. Perinephric stranding/edema bilaterally, likely due to the chronic kidney disease.  Normal unenhanced appearance  of the liver, spleen, pancreas, and right adrenal gland.  Approximate 1.9 x 1.2 cm nodule arising from the crux of the left adrenal gland, Hounsfield measurement tendon. Gallbladder surgically absent.  No unexpected biliary ductal dilation.  Severe aorto-iliofemoral atherosclerosis without aneurysm.  Atherosclerosis involving the visceral arteries.  No significant lymphadenopathy.  Small hiatal hernia.  Stomach otherwise unremarkable.  Normal- appearing small bowel.  Scattered diverticula involving the sigmoid colon without evidence of acute diverticulitis.  Remainder of the colon normal in appearance.  Lipoma involving the ileocecal valve. Normal appendix in the right mid and upper pelvis.  No ascites.  Urinary bladder decompressed and unremarkable.  Prostate gland not enlarged for age.  Bilateral inguinal hernias containing fat, larger on the left and a small on the right.  Bone window images demonstrate degenerative changes involving the lower thoracic and lumbar spine and both hips.  Scarring in the visualized lingula and right middle lobe.  Visualized lung bases otherwise clear apart from a densely calcified granuloma in the deep posterior costophrenic sulcus of the left lower lobe.  Heart enlarged.  Transvenous pacer lead tip at the RV apex.  Pacing wire in the coronary sinus consistent with a biventricular pacer.  IMPRESSION:  1.  No acute abnormalities involving the abdomen or pelvis. 2.  Diffuse cortical thinning involving both kidneys.  2 cm simple cyst arising from the lower pole of the left kidney.  No significant focal hepatic parenchymal abnormalities.  No urinary tract calculi.  3.  Scattered sigmoid colon diverticula without evidence of acute diverticulitis. 4.  Small hiatal hernia. 5.  Bilateral inguinal hernias containing fat, large on the left  and moderate sized on the right. 6.  Left adrenal nodule statistically consistent with adenoma.   Original Report Authenticated By: Hulan Saas, M.D.     Scheduled Meds: . acetaminophen  1,000 mg Oral BID  . amiodarone  200 mg Oral Daily  . carvedilol  12.5 mg Oral BID WC  . Chlorhexidine Gluconate Cloth  6 each Topical Q0600  . clopidogrel  75 mg Oral Daily  . ferrous sulfate  325 mg Oral QPM  . hydrALAZINE  25 mg Oral TID  . insulin aspart  0-15 Units Subcutaneous TID WC  . insulin aspart  0-5 Units Subcutaneous QHS  . insulin aspart protamine- aspart  30 Units Subcutaneous BID WC  . isosorbide mononitrate  30 mg Oral Daily  . l-methylfolate-B6-B12  1 tablet Oral Daily  . mupirocin ointment  1 application Nasal BID  . piperacillin-tazobactam (ZOSYN)  IV  3.375 g Intravenous Q8H  . pyridOXINE  50 mg Oral Daily  . sodium chloride  3 mL Intravenous Q12H  . vancomycin  1,500 mg Intravenous Q24H  . Warfarin - Pharmacist Dosing Inpatient   Does not apply q1800  . zinc sulfate  220 mg Oral Daily   Continuous Infusions:   Principal Problem:   SIRS (systemic inflammatory response syndrome) Active Problems:   Essential hypertension, benign   CAD, NATIVE VESSEL   HTN (hypertension)   Paroxysmal atrial fibrillation   Acute on chronic kidney disease, stage 3    Time spent: 30 minutes    Drema Dallas  [facility] Pager [phone] If 7PM-7AM, please contact night-coverage at [email or web address], password [facility] 06/14/2013, 10:54 PM  LOS: 3 days

## 2013-06-14 NOTE — Progress Notes (Signed)
ANTICOAGULATION CONSULT and ANTIBIOTIC CONSULT NOTE - Follow Up Consult  Pharmacy Consult for Coumadin and Vancomycin Indication: atrial fibrillation, SIRS (HCAP vs. UTI)  Allergies  Allergen Reactions  . Codeine     REACTION: blisters, but able to take hydrocodone    Patient Measurements: Height: 5\' 8"  (172.7 cm) Weight: 246 lb 4.8 oz (111.721 kg) (scale B) IBW/kg (Calculated) : 68.4   Vital Signs: Temp: 97.9 F (36.6 C) (07/30 0400) Temp src: Oral (07/30 0400) BP: 168/56 mmHg (07/30 0400) Pulse Rate: 59 (07/30 0400)  Labs:  Recent Labs  06/11/13 1708 06/11/13 1709 06/12/13 0510 06/13/13 0500 06/13/13 1105 06/14/13 0508  HGB 10.7*  --  9.6* 10.1*  --   --   HCT 33.7*  --  29.9* 31.4*  --   --   PLT 262  --  228 218  --   --   APTT 58*  --   --   --   --   --   LABPROT 22.7*  --  20.7* 20.7*  --  26.9*  INR 2.08*  --  1.84* 1.84*  --  2.59*  CREATININE 1.78*  --  2.01* 1.88*  --   --   TROPONINI  --  <0.30  --   --  <0.30  --     Estimated Creatinine Clearance: 42.4 ml/min (by C-G formula based on Cr of 1.88).   Medications:  Scheduled:  . acetaminophen  1,000 mg Oral BID  . amiodarone  200 mg Oral Daily  . carvedilol  12.5 mg Oral BID WC  . Chlorhexidine Gluconate Cloth  6 each Topical Q0600  . clopidogrel  75 mg Oral Daily  . ferrous sulfate  325 mg Oral QPM  . hydrALAZINE  25 mg Oral TID  . insulin aspart  0-15 Units Subcutaneous TID WC  . insulin aspart  0-5 Units Subcutaneous QHS  . insulin aspart protamine- aspart  30 Units Subcutaneous BID WC  . isosorbide mononitrate  30 mg Oral Daily  . l-methylfolate-B6-B12  1 tablet Oral Daily  . mupirocin ointment  1 application Nasal BID  . piperacillin-tazobactam (ZOSYN)  IV  3.375 g Intravenous Q8H  . pyridOXINE  50 mg Oral Daily  . sodium chloride  3 mL Intravenous Q12H  . vancomycin  1,500 mg Intravenous Q24H  . Warfarin - Pharmacist Dosing Inpatient   Does not apply q1800  . zinc sulfate  220 mg  Oral Daily    Assessment: 72 y/o M presents with cough and chills. Started vancomycin per pharmacy, Zosyn per MD. Cefepime x 1 in the ED at 1920. WBC 15.5, Scr 1.78, Tmax 102.9, CXR unremarkable. Paged and left sticky note for MD to consider de-escalating abx based on urine cx sensitivity results.  Continued coumadin from [facility] (5mg  qday), INR on admit is 2.08, Hgb 10.7, no overt bleeding noted. INR is therapeutic at 2.59 today with 7.5 mg last night.  Due to increase INR from 1.84, decrease dose today.  Goal of Therapy:  Resolution of infection INR 2-3 Monitor platelets by anticoagulation protocol: Yes    Plan:  1. Continue Vancomycin 1500 mg IV q24h 2. F/u with MD recommendation on ABX, and order VT if still appropriate 3. F/u renal fxn, cbc 4. Change to Coumadin 4mg  x1 5. Daily INR 6. Monitor for bleeding, f/u cbc 7. Coumadin education  Anabel Bene, PharmD Clinical Pharmacist Pager: [phone]

## 2013-06-15 LAB — LIPID PANEL
LDL Cholesterol: 89 mg/dL (ref 0–99)
VLDL: 43 mg/dL — ABNORMAL HIGH (ref 0–40)

## 2013-06-15 LAB — PROTIME-INR: Prothrombin Time: 30.7 seconds — ABNORMAL HIGH (ref 11.6–15.2)

## 2013-06-15 LAB — GLUCOSE, CAPILLARY
Glucose-Capillary: 136 mg/dL — ABNORMAL HIGH (ref 70–99)
Glucose-Capillary: 164 mg/dL — ABNORMAL HIGH (ref 70–99)

## 2013-06-15 LAB — COMPREHENSIVE METABOLIC PANEL
ALT: 15 U/L (ref 0–53)
AST: 25 U/L (ref 0–37)
CO2: 23 mEq/L (ref 19–32)
Chloride: 104 mEq/L (ref 96–112)
Creatinine, Ser: 1.42 mg/dL — ABNORMAL HIGH (ref 0.50–1.35)
GFR calc Af Amer: 55 mL/min — ABNORMAL LOW (ref >90)
GFR calc non Af Amer: 47 mL/min — ABNORMAL LOW (ref >90)
Glucose, Bld: 134 mg/dL — ABNORMAL HIGH (ref 70–99)
Sodium: 135 mEq/L (ref 135–145)
Total Bilirubin: 0.2 mg/dL — ABNORMAL LOW (ref 0.3–1.2)

## 2013-06-15 LAB — HEMOGLOBIN A1C: Mean Plasma Glucose: 163 mg/dL — ABNORMAL HIGH (ref <117)

## 2013-06-15 MED ORDER — CEPHALEXIN 500 MG PO CAPS: 500.0000 mg | ORAL_CAPSULE | Freq: Two times a day (BID) | ORAL | Status: DC

## 2013-06-15 NOTE — Clinical Documentation Improvement (Signed)
THIS DOCUMENT IS NOT A PERMANENT PART OF THE MEDICAL RECORD  Please update your documentation with the medical record to reflect your response to this query. If you need help knowing how to do this please call [phone].  06/15/13  Dr. Carolyne Littles,  In a better effort to capture your patient's severity of illness, reflect appropriate length of stay and utilization of resources, a review of the patient medical record has revealed the following indicators:   - Current diagnosis is "SIRS"   - DDx on admission included HCAP and UTI causing his fever per H&P   - Urine Cx this admission:  70,000 E Coli   - Medications given this admission:  Cefepime (ED) Vancomycin, Zosyn, and switched to po Nitrofurantoin               prior to discharge   - Temp 102.9 in ED   - WBC 15k on admission    Based on your clinical judgment and in light that the patient had at least 2 SIRS criteria and a known source of infection, please document in the progress notes and discharge summary if a condition below provides greater specificity regarding the patient's reason for admission:   - Sepsis 2/2 UTI, Present on Admission   - Other Condition  - Unable to Clinically Determine   In responding to this query please exercise your independent judgment.     The fact that a query is asked, does not imply that any particular answer is desired or expected.    Reviewed: additional documentation in the medical record  Thank You,  Jerral Ralph  RN BSN CCDS Certified Clinical Documentation Specialist [phone] Health Information Management [place]

## 2013-06-15 NOTE — Discharge Summary (Addendum)
Physician Discharge Summary  ED [name] ZOX:[id number] DOB: 1941-03-25 DOA: 06/11/2013  PCP: [name], MD  Admit date: 06/11/2013 Discharge date: 06/15/2013  Time spent: 30 minutes  Recommendations for Outpatient Follow-up:  1) SIRS: 2dary to UTI;   UTI present on admission.  After conferring with pharmacy we'll DC nitrofurantoin and start patient on Keflex 500 mg twice a day. 2) CKD stage 3, patient's creatinine back to baseline. Ready for discharge  3) PAfib: Cont amiodarone and coumadin per pharmacy. He is therapeutic.  4) IDDM type 2: Insulin ordered, BG will be monitored.  5) HTN: hemodynamics stable. Home meds.  6) Proph: no lovenox, he is on coumadin.  7.Hx CHF systolic heart failure, last 2-D echo was done 04/15/13 with an EF of 35-40% he is followed by Dr. Patty Sermons, on amiodarone, Coreg, hydralazine, Imdur, Demadex at home. Demadex on hold given increase in his creatinine    Discharge Diagnoses:  Principal Problem:   SIRS (systemic inflammatory response syndrome) Active Problems:   Essential hypertension, benign   CAD, NATIVE VESSEL   HTN (hypertension)   Paroxysmal atrial fibrillation   Acute on chronic kidney disease, stage 3   Discharge Condition: Stable  Diet recommendation: Diabetic, heart healthy  Filed Weights   06/13/13 0501 06/14/13 0400 06/15/13 0540  Weight: 111.4 kg (245 lb 9.5 oz) 111.721 kg (246 lb 4.8 oz) 110.723 kg (244 lb 1.6 oz)    History of present illness:  72 yo WM PMHx PAF on coumadin and amiodarone, s/p BiV, hx NSTEMI with PCI and DES placement 04/2013, lymphoma (per patient), iron deficiency anemia, CKD stage 3 (Cr 1.3), HL, HTN, presents due to chills and cough. He states he had an appointment with his nephrologist 2 days ago (routine) and was told that his BP was low (SBP 90's). As a result, one of his BP meds was decreased in dosage, which helped. Yesterday he states he started to have a cough with productive clear sputum and significant  chills. His wife states he's had a fever, and he typically does not have fevers like this. Currently, he denies any SOB or CP. He states he has had increased urinary frequency but no dysuria. He denies diarrhea. He denies abdominal pain. He denies any change in LE swelling. He denies HA's.  In the ED, he was found to have a Temp of 102.9 F. WBC noted to be 15k with a neutrophil predominance, but no bandemia. CXR does not show a definite infiltrate. UA showed Escherichia coli TODAY negative abdominal pain negative shortness of breath negative chest pain ready to be discharged    Procedures: CT abdomen and pelvis without contrast 06/13/2013 1. No acute abnormalities involving the abdomen or pelvis.  2. Diffuse cortical thinning involving both kidneys. 2 cm simple  cyst arising from the lower pole of the left kidney. No  significant focal hepatic parenchymal abnormalities. No urinary  tract calculi.  3. Scattered sigmoid colon diverticula without evidence of acute  diverticulitis.  4. Small hiatal hernia.  5. Bilateral inguinal hernias containing fat, large on the left  and moderate sized on the right.  6. Left adrenal nodule statistically consistent with adenoma    Discharge Exam: Filed Vitals:   06/14/13 1707 06/14/13 2124 06/15/13 0540 06/15/13 1035  BP: 163/68 156/63 179/62 147/76  Pulse: 53 57 61   Temp:  98.1 F (36.7 C) 98 F (36.7 C)   TempSrc:  Oral Oral   Resp:  18 18   Height:  Weight:   110.723 kg (244 lb 1.6 oz)   SpO2:  98% 96%     General: Alert, NAD Cardiovascular: Regular rhythm and rate, negative murmurs rubs or gallops, Respiratory: Clear to auscultation bilateral Abdomen; soft nontender nondistended plus bowel sounds Musculoskeletal; negative CVA tenderness  Discharge Instructions   Future Appointments Provider Department Dept Phone   07/07/2013 9:00 AM Jonelle Sidle, MD [place] [facility] (near [place]) [phone]       Medication List     ASK your doctor about these medications       acetaminophen 650 MG CR tablet  Commonly known as:  TYLENOL  Take 1,300 mg by mouth 2 (two) times daily.     amiodarone 400 MG tablet  Commonly known as:  PACERONE  Take 200 mg by mouth daily.     carvedilol 12.5 MG tablet  Commonly known as:  COREG  Take 12.5 mg by mouth 2 (two) times daily with a meal.     clopidogrel 75 MG tablet  Commonly known as:  PLAVIX  Take 75 mg by mouth daily.     ferrous sulfate 325 (65 FE) MG tablet  Take 325 mg by mouth every evening.     hydrALAZINE 25 MG tablet  Commonly known as:  APRESOLINE  Take 25 mg by mouth 3 (three) times daily.     HYDROcodone-acetaminophen 10-325 MG per tablet  Commonly known as:  NORCO  Take 1 tablet by mouth every 8 (eight) hours as needed for pain.     insulin lispro protamine-lispro (75-25) 100 UNIT/ML Susp  Commonly known as:  HUMALOG 75/25  Inject 30-35 Units into the skin 3 (three) times daily after meals.     isosorbide mononitrate 30 MG 24 hr tablet  Commonly known as:  IMDUR  Take 30 mg by mouth daily.     linagliptin 5 MG Tabs tablet  Commonly known as:  TRADJENTA  Take 5 mg by mouth daily.     NEURPATH-B 3-35-2 MG Tabs  Take 1 tablet by mouth 2 (two) times daily.     l-methylfolate-B6-B12 3-35-2 MG Tabs  Commonly known as:  METANX  Take 1 tablet by mouth daily.     nitroGLYCERIN 0.4 MG SL tablet  Commonly known as:  NITROSTAT  Place 0.4 mg under the tongue every 5 (five) minutes as needed for chest pain. For chest pain     pyridOXINE 50 MG tablet  Commonly known as:  B-6  Take 50 mg by mouth daily.     torsemide 10 MG tablet  Commonly known as:  DEMADEX  Take 5 mg by mouth every evening.     warfarin 5 MG tablet  Commonly known as:  COUMADIN  Take 5 mg by mouth daily.     Zinc 50 MG Caps  Take 50 mg by mouth daily.       Allergies  Allergen Reactions  . Codeine     REACTION: blisters, but able to take hydrocodone      The  results of significant diagnostics from this hospitalization (including imaging, microbiology, ancillary and laboratory) are listed below for reference.    Significant Diagnostic Studies: Ct Abdomen Pelvis Wo Contrast  06/13/2013   *RADIOLOGY REPORT*  Clinical Data: Unexplained recurrent urinary tract infections. Surgical history includes cholecystectomy.  Chronic kidney disease, stage III.  CT ABDOMEN AND PELVIS WITHOUT CONTRAST  Technique:  Multidetector CT imaging of the abdomen and pelvis was performed following the standard protocol without intravenous contrast.  Comparison: None.  Findings: Mild diffuse cortical thinning involving both kidneys. No hydronephrosis.  Approximate 2 cm simple cyst arising from the lower pole of the left kidney.  Within the limits of the unenhanced technique, no significant focal parenchymal abnormality involving either kidney.  No urinary tract calculi on either side. Perinephric stranding/edema bilaterally, likely due to the chronic kidney disease.  Normal unenhanced appearance of the liver, spleen, pancreas, and right adrenal gland.  Approximate 1.9 x 1.2 cm nodule arising from the crux of the left adrenal gland, Hounsfield measurement tendon. Gallbladder surgically absent.  No unexpected biliary ductal dilation.  Severe aorto-iliofemoral atherosclerosis without aneurysm.  Atherosclerosis involving the visceral arteries.  No significant lymphadenopathy.  Small hiatal hernia.  Stomach otherwise unremarkable.  Normal- appearing small bowel.  Scattered diverticula involving the sigmoid colon without evidence of acute diverticulitis.  Remainder of the colon normal in appearance.  Lipoma involving the ileocecal valve. Normal appendix in the right mid and upper pelvis.  No ascites.  Urinary bladder decompressed and unremarkable.  Prostate gland not enlarged for age.  Bilateral inguinal hernias containing fat, larger on the left and a small on the right.  Bone window images  demonstrate degenerative changes involving the lower thoracic and lumbar spine and both hips.  Scarring in the visualized lingula and right middle lobe.  Visualized lung bases otherwise clear apart from a densely calcified granuloma in the deep posterior costophrenic sulcus of the left lower lobe.  Heart enlarged.  Transvenous pacer lead tip at the RV apex.  Pacing wire in the coronary sinus consistent with a biventricular pacer.  IMPRESSION:  1.  No acute abnormalities involving the abdomen or pelvis. 2.  Diffuse cortical thinning involving both kidneys.  2 cm simple cyst arising from the lower pole of the left kidney.  No significant focal hepatic parenchymal abnormalities.  No urinary tract calculi.  3.  Scattered sigmoid colon diverticula without evidence of acute diverticulitis. 4.  Small hiatal hernia. 5.  Bilateral inguinal hernias containing fat, large on the left and moderate sized on the right. 6.  Left adrenal nodule statistically consistent with adenoma.   Original Report Authenticated By: Hulan Saas, M.D.   Dg Chest 2 View  06/11/2013   *RADIOLOGY REPORT*  Clinical Data: Chest pain  CHEST - 2 VIEW  Comparison:  September 26, 2013  Findings:  There is no edema or consolidation.  Heart is borderline enlarged with normal pulmonary vascularity.  Pacemaker leads are attached to the right atrium and right ventricle.  No adenopathy. No pneumothorax.  There is degenerative change in the thoracic spine.  IMPRESSION: No edema or consolidation.  Stable borderline cardiomegaly.   Original Report Authenticated By: Bretta Bang, M.D.   Dg Chest 2 View  05/26/2013   *RADIOLOGY REPORT*  Clinical Data: Mid chest pain, shortness of breath, rapid heart rate  CHEST - 2 VIEW  Comparison: Chest x-ray of 05/12/2013  Findings: No active infiltrate or effusion is seen.  There is cardiomegaly present and may be minimal pulmonary vascular congestion present.  Defibrillator leads are noted.  There are degenerative  changes throughout the thoracic spine.  IMPRESSION: Cardiomegaly.  Question minimal pulmonary vascular congestion. Defibrillator leads are noted.   Original Report Authenticated By: Dwyane Dee, M.D.    Microbiology: Recent Results (from the past 240 hour(s))  URINE CULTURE     Status: None   Collection Time    06/11/13  4:03 PM      Result Value Range Status  Specimen Description URINE, CLEAN CATCH   Final   Special Requests NONE   Final   Culture  Setup Time 06/12/2013 02:18   Final   Colony Count 70,000 COLONIES/ML   Final   Culture ESCHERICHIA COLI   Final   Report Status 06/13/2013 FINAL   Final   Organism ID, Bacteria ESCHERICHIA COLI   Final  CULTURE, BLOOD (ROUTINE X 2)     Status: None   Collection Time    06/11/13  4:40 PM      Result Value Range Status   Specimen Description BLOOD RIGHT HAND   Final   Special Requests BOTTLES DRAWN AEROBIC ONLY 5CC   Final   Culture  Setup Time 06/12/2013 01:48   Final   Culture     Final   Value:        BLOOD CULTURE RECEIVED NO GROWTH TO DATE CULTURE WILL BE HELD FOR 5 DAYS BEFORE ISSUING A FINAL NEGATIVE REPORT   Report Status PENDING   Incomplete  CULTURE, BLOOD (ROUTINE X 2)     Status: None   Collection Time    06/11/13  4:50 PM      Result Value Range Status   Specimen Description BLOOD RIGHT HAND   Final   Special Requests BOTTLES DRAWN AEROBIC ONLY 10CC   Final   Culture  Setup Time 06/12/2013 01:48   Final   Culture     Final   Value:        BLOOD CULTURE RECEIVED NO GROWTH TO DATE CULTURE WILL BE HELD FOR 5 DAYS BEFORE ISSUING A FINAL NEGATIVE REPORT   Report Status PENDING   Incomplete  MRSA PCR SCREENING     Status: Abnormal   Collection Time    06/11/13  9:06 PM      Result Value Range Status   MRSA by PCR POSITIVE (*) NEGATIVE Final   Comment:            The GeneXpert MRSA Assay (FDA     approved for NASAL specimens     only), is one component of a     comprehensive MRSA colonization     surveillance program. It  is not     intended to diagnose MRSA     infection nor to guide or     monitor treatment for     MRSA infections.     RESULT CALLED TO, READ BACK BY AND VERIFIED WITH:     MUELLER,L RN AT 0006 06/12/13 MITCHELL,L     Labs: Basic Metabolic Panel:  Recent Labs Lab 06/11/13 1708 06/12/13 0510 06/13/13 0500 06/15/13 0440  NA 129* 128* 131* 135  K 5.1 5.1 4.7 4.4  CL 93* 95* 98 104  CO2 23 21 23 23   GLUCOSE 171* 157* 145* 134*  BUN 30* 34* 33* 20  CREATININE 1.78* 2.01* 1.88* 1.42*  CALCIUM 9.2 8.5 8.9 8.9  MG  --   --   --  2.2   Liver Function Tests:  Recent Labs Lab 06/11/13 1708 06/12/13 0510 06/15/13 0440  AST 30 27 25   ALT 16 15 15   ALKPHOS 91 77 85  BILITOT 0.5 0.5 0.2*  PROT 7.5 6.8 6.1  ALBUMIN 3.5 3.2* 2.6*   No results found for this basename: LIPASE, AMYLASE,  in the last 168 hours No results found for this basename: AMMONIA,  in the last 168 hours CBC:  Recent Labs Lab 06/11/13 1708 06/12/13 0510 06/13/13 0500  WBC 15.5* 13.6*  7.5  NEUTROABS 13.7*  --   --   HGB 10.7* 9.6* 10.1*  HCT 33.7* 29.9* 31.4*  MCV 80.8 80.4 80.5  PLT 262 228 218   Cardiac Enzymes:  Recent Labs Lab 06/11/13 1709 06/13/13 1105  TROPONINI <0.30 <0.30   BNP: BNP (last 3 results)  Recent Labs  04/14/13 1721 05/26/13 1220  PROBNP 1020.0* 2644.0*   CBG:  Recent Labs Lab 06/14/13 1525 06/14/13 1711 06/14/13 2116 06/15/13 0625 06/15/13 1159  GLUCAP 85 136* 118* 136* 164*       Signed:  Alika Saladin, J  [facility] 06/15/2013, 2:11 PM

## 2013-06-15 NOTE — Progress Notes (Signed)
ANTICOAGULATION CONSULT NOTE - Follow Up Consult  Pharmacy Consult for Coumadin Indication: atrial fibrillation  Allergies  Allergen Reactions  . Codeine     REACTION: blisters, but able to take hydrocodone    Patient Measurements: Height: 5\' 8"  (172.7 cm) Weight: 244 lb 1.6 oz (110.723 kg) (scale B) IBW/kg (Calculated) : 68.4   Vital Signs: Temp: 98 F (36.7 C) (07/31 0540) Temp src: Oral (07/31 0540) BP: 179/62 mmHg (07/31 0540) Pulse Rate: 61 (07/31 0540)  Labs:  Recent Labs  06/13/13 0500 06/13/13 1105 06/14/13 0508 06/15/13 0440  HGB 10.1*  --   --   --   HCT 31.4*  --   --   --   PLT 218  --   --   --   LABPROT 20.7*  --  26.9* 30.7*  INR 1.84*  --  2.59* 3.08*  CREATININE 1.88*  --   --  1.42*  TROPONINI  --  <0.30  --   --     Estimated Creatinine Clearance: 55.9 ml/min (by C-G formula based on Cr of 1.42).   Medications:  Prescriptions prior to admission  Medication Sig Dispense Refill  . acetaminophen (TYLENOL) 650 MG CR tablet Take 1,300 mg by mouth 2 (two) times daily.       Marland Kitchen amiodarone (PACERONE) 400 MG tablet Take 200 mg by mouth daily.      . carvedilol (COREG) 12.5 MG tablet Take 12.5 mg by mouth 2 (two) times daily with a meal.      . clopidogrel (PLAVIX) 75 MG tablet Take 75 mg by mouth daily.      . ferrous sulfate 325 (65 FE) MG tablet Take 325 mg by mouth every evening.       . hydrALAZINE (APRESOLINE) 25 MG tablet Take 25 mg by mouth 3 (three) times daily.      Marland Kitchen HYDROcodone-acetaminophen (NORCO) 10-325 MG per tablet Take 1 tablet by mouth every 8 (eight) hours as needed for pain.       Marland Kitchen insulin lispro protamine-insulin lispro (HUMALOG 75/25) (75-25) 100 UNIT/ML SUSP Inject 30-35 Units into the skin 3 (three) times daily after meals.       . isosorbide mononitrate (IMDUR) 30 MG 24 hr tablet Take 30 mg by mouth daily.      Marland Kitchen l-methylfolate-B6-B12 (METANX) 3-35-2 MG TABS Take 1 tablet by mouth daily.       Marland Kitchen L-Methylfolate-B6-B12  (NEURPATH-B) 3-35-2 MG TABS Take 1 tablet by mouth 2 (two) times daily.       Marland Kitchen linagliptin (TRADJENTA) 5 MG TABS tablet Take 5 mg by mouth daily.      . nitroGLYCERIN (NITROSTAT) 0.4 MG SL tablet Place 0.4 mg under the tongue every 5 (five) minutes as needed for chest pain. For chest pain      . pyridoxine (B-6) 50 MG tablet Take 50 mg by mouth daily.       Marland Kitchen torsemide (DEMADEX) 10 MG tablet Take 5 mg by mouth every evening.      . warfarin (COUMADIN) 5 MG tablet Take 5 mg by mouth daily.      . Zinc 50 MG CAPS Take 50 mg by mouth daily.         Assessment: 72 y/o M presents with cough and chills,  continued coumadin from PTA (5mg  qday). INR on admit is 2.08, Hgb 10.7, no overt bleeding noted. INR is slightly supratherapeutic at 3.08 today with 7.5 mg last night. Due to increase INR from  2.59, hold dose today.  Goal of Therapy:  INR 2-3 Monitor platelets by anticoagulation protocol: Yes   Plan:   1. Hold Coumadin dose for today 2. Daily INR 3. Monitor for bleeding, f/u cbc  Anabel Bene, PharmD Clinical Pharmacist Resident Pager: [phone]

## 2013-06-15 NOTE — Progress Notes (Signed)
Received patient sitting on the chair, appears comfortable. Patient is alert and oriented. On room air; not in distress. No complaints of pain. Other initial assessment, please see flow sheet. Will monitor patient accordingly.

## 2013-06-15 NOTE — Progress Notes (Signed)
All d/c instructions explained and given to pt.  Verbalized understanding.  Pt d/c to home.  Accompanied by NT with w/c.to awaiting transportation.

## 2013-06-18 LAB — CULTURE, BLOOD (ROUTINE X 2): Culture: NO GROWTH

## 2013-07-03 ENCOUNTER — Ambulatory Visit: Payer: Self-pay | Admitting: Cardiology

## 2013-07-06 DIAGNOSIS — N390 Urinary tract infection, site not specified: Secondary | ICD-10-CM | POA: Diagnosis present

## 2013-07-07 ENCOUNTER — Ambulatory Visit (INDEPENDENT_AMBULATORY_CARE_PROVIDER_SITE_OTHER): Payer: Medicare Other | Admitting: Cardiology

## 2013-07-07 ENCOUNTER — Encounter: Payer: Self-pay | Admitting: Cardiology

## 2013-07-07 ENCOUNTER — Ambulatory Visit: Payer: Medicare Other | Admitting: Cardiology

## 2013-07-07 VITALS — BP 117/55 | HR 50 | Ht 68.0 in | Wt 242.0 lb

## 2013-07-07 DIAGNOSIS — I2510 Atherosclerotic heart disease of native coronary artery without angina pectoris: Secondary | ICD-10-CM

## 2013-07-07 DIAGNOSIS — I5022 Chronic systolic (congestive) heart failure: Secondary | ICD-10-CM

## 2013-07-07 DIAGNOSIS — I2589 Other forms of chronic ischemic heart disease: Secondary | ICD-10-CM

## 2013-07-07 DIAGNOSIS — I4891 Unspecified atrial fibrillation: Secondary | ICD-10-CM

## 2013-07-07 DIAGNOSIS — Z95810 Presence of automatic (implantable) cardiac defibrillator: Secondary | ICD-10-CM

## 2013-07-07 DIAGNOSIS — I255 Ischemic cardiomyopathy: Secondary | ICD-10-CM

## 2013-07-07 DIAGNOSIS — I480 Paroxysmal atrial fibrillation: Secondary | ICD-10-CM

## 2013-07-07 NOTE — Progress Notes (Signed)
Clinical Summary  [name] is a medically complex 72 y.o.male last seen in July of this year by Mr. Serpe PA-C. He has been recently managed with combination of Coumadin and Plavix, aspirin was discontinued. Has history of PAF with high thromboembolic risk score, also CAD with prior interventions including DES to the RCA in the setting of NSTEMI in June.. He has also been managed with amiodarone in an attempt to maintain sinus rhythm.   Echocardiogram from May of this year demonstrated moderate LVH with LVEF 35-40%, grade 1 diastolic dysfunction, mildly dilated left atrium. Recent lab work in July showed potassium 4.4, BUN 20, creatinine 1.4, AST 25, ALT 15, cholesterol 152, triglycerides 215, HDL 20, LDL 89.  He is here with his wife today. States that he has been doing well, no angina, no palpitations. Still dances 3 nights a week. He reports no bleeding problems on Plavix and Coumadin. Amiodarone has been cut back to 200 mg daily. Weight is down 2 pounds.   Allergies  Allergen Reactions  . Codeine     REACTION: blisters, but able to take hydrocodone    Current Outpatient Prescriptions  Medication Sig Dispense Refill  . acetaminophen (TYLENOL) 650 MG CR tablet Take 1,300 mg by mouth 2 (two) times daily.       Marland Kitchen amiodarone (PACERONE) 400 MG tablet Take 200 mg by mouth daily.      . carvedilol (COREG) 12.5 MG tablet Take 12.5 mg by mouth 2 (two) times daily with a meal.      . clopidogrel (PLAVIX) 75 MG tablet Take 75 mg by mouth daily.      . ferrous sulfate 325 (65 FE) MG tablet Take 325 mg by mouth every evening.       . hydrALAZINE (APRESOLINE) 25 MG tablet Take 25 mg by mouth 3 (three) times daily.      Marland Kitchen HYDROcodone-acetaminophen (NORCO) 10-325 MG per tablet Take 1 tablet by mouth every 8 (eight) hours as needed for pain.       Marland Kitchen insulin lispro protamine-insulin lispro (HUMALOG 75/25) (75-25) 100 UNIT/ML SUSP Inject 30-35 Units into the skin 3 (three) times daily after meals.         . isosorbide mononitrate (IMDUR) 30 MG 24 hr tablet Take 30 mg by mouth daily.      Marland Kitchen l-methylfolate-B6-B12 (METANX) 3-35-2 MG TABS Take 1 tablet by mouth daily.       Marland Kitchen L-Methylfolate-B6-B12 (NEURPATH-B) 3-35-2 MG TABS Take 1 tablet by mouth 2 (two) times daily.       Marland Kitchen linagliptin (TRADJENTA) 5 MG TABS tablet Take 5 mg by mouth daily.      . nitroGLYCERIN (NITROSTAT) 0.4 MG SL tablet Place 0.4 mg under the tongue every 5 (five) minutes as needed for chest pain. For chest pain      . potassium chloride (K-DUR) 10 MEQ tablet Take 10 mEq by mouth daily.      Marland Kitchen torsemide (DEMADEX) 10 MG tablet Take 5 mg by mouth every evening.      . warfarin (COUMADIN) 5 MG tablet Take 5 mg by mouth daily.      . Zinc 50 MG CAPS Take 50 mg by mouth daily.        No current facility-administered medications for this visit.    Past Medical History  Diagnosis Date  . Type 2 diabetes mellitus   . Coronary atherosclerosis of native coronary artery     a. DES to RCA 03/2009. b. s/p  PTCA to RCA for ISR, 05/2010. c. 03/2013 NSTEMI 2/2 severe prox RCA s/p PTCA/DES, med rx for residual dz.  . Hyperlipidemia   . TIA (transient ischemic attack)     Multiple TIAs  . Cardiomyopathy, ischemic     LVEF 25-35%.  . Morbid obesity   . BPH (benign prostatic hypertrophy)   . LBBB (left bundle branch block)     s/p BiV ICD Implanted by Dr Graciela Husbands  . Paroxysmal atrial fibrillation     a. Coumadin discontinue in 2010 when the patient required ASA/Plavix for stenting. b. Coumadin restarted 05/2013, Plavix continued, ASA stopped.   . Chronic kidney disease, stage III (moderate)     Followed by Dr. Fausto Skillern.  . Essential hypertension, benign   . Anemia-chronic     a. Pt reports h/o anemia - was offered IV iron in past by nephrologist but declined and has taken PO instead.  . Aneurysm, cerebral   . MRSA (methicillin resistant Staphylococcus aureus)   . Anemia   . Pulmonary nodule     CXR, 03/2013, [facility] - not described on any  subsequent chest x-rays however, could be a vascular structure    Past Surgical History  Procedure Laterality Date  . Cholecystectomy    . Total knee arthroplasty    . Vasectomy    . Lung surgery    . Icd placement      Medtronic Protecta XT CRT-D    Social History [name] reports that he quit smoking about 48 years ago. His smoking use included Cigarettes. He has a 6 pack-year smoking history. He quit smokeless tobacco use about 48 years ago. [name] reports that  drinks alcohol.  Review of Systems As outlined above.  Physical Examination Filed Vitals:   07/07/13 0858  BP: 117/55  Pulse: 50   Filed Weights   07/07/13 0858  Weight: 242 lb (109.77 kg)   Appears comfortable. HEENT: Conjunctiva and lids normal, oropharynx with moist mucosa.  Neck: Supple, no elevated JVP with increased girth.  Cardiac: Regular rate and rhythm, no S3.  Abdomen: Obese, nontender, bowel sounds present.  Skin: Warm and dry.  Extremities: Trace ankle edema.  Musculoskeletal: No kyphosis.  Neuropsychiatric: Alert and oriented x3, affect appropriate.   Problem List and Plan   Paroxysmal atrial fibrillation Continue amiodarone and Coumadin.  CAD, NATIVE VESSEL Symptomatically stable with most recent intervention being DES to the RCA in the setting of NSTEMI in June. We will keep him on Plavix concurrently with Coumadin. He is not taking aspirin.  Cardiomyopathy, ischemic LVEF was 35-40% in May.  SYSTOLIC HEART FAILURE, CHRONIC Clinically stable, weight is down 2 pounds from last visit. No change to current regimen.  CKD (chronic kidney disease) stage 3, GFR 30-59 ml/min Recent creatinine 1.4.  Biventricular ICD (implantable cardioverter-defibrillator) in place Keep followup with EP.    Jonelle Sidle, M.D., F.A.C.C.

## 2013-07-07 NOTE — Assessment & Plan Note (Signed)
Symptomatically stable with most recent intervention being DES to the RCA in the setting of NSTEMI in June. We will keep him on Plavix concurrently with Coumadin. He is not taking aspirin.

## 2013-07-07 NOTE — Assessment & Plan Note (Signed)
Keep followup with EP.

## 2013-07-07 NOTE — Assessment & Plan Note (Signed)
Continue amiodarone and Coumadin. 

## 2013-07-07 NOTE — Assessment & Plan Note (Signed)
Clinically stable, weight is down 2 pounds from last visit. No change to current regimen.

## 2013-07-07 NOTE — Assessment & Plan Note (Signed)
Recent creatinine 1.4. 

## 2013-07-07 NOTE — Patient Instructions (Addendum)
Your physician recommends that you schedule a follow-up appointment in: 3 months. Your physician recommends that you continue on your current medications as directed. Please refer to the Current Medication list given to you today. 

## 2013-07-07 NOTE — Assessment & Plan Note (Signed)
LVEF was 35-40% in May.

## 2013-07-20 ENCOUNTER — Encounter (HOSPITAL_COMMUNITY): Payer: Self-pay | Admitting: *Deleted

## 2013-07-20 ENCOUNTER — Inpatient Hospital Stay (HOSPITAL_COMMUNITY)
Admission: EM | Admit: 2013-07-20 | Discharge: 2013-07-23 | DRG: 690 | Disposition: A | Discharge status: Home / Self Care | Payer: Medicare Other | Attending: Internal Medicine | Admitting: Internal Medicine

## 2013-07-20 DIAGNOSIS — I480 Paroxysmal atrial fibrillation: Secondary | ICD-10-CM | POA: Diagnosis present

## 2013-07-20 DIAGNOSIS — N3090 Cystitis, unspecified without hematuria: Principal | ICD-10-CM | POA: Diagnosis present

## 2013-07-20 DIAGNOSIS — E785 Hyperlipidemia, unspecified: Secondary | ICD-10-CM | POA: Diagnosis present

## 2013-07-20 DIAGNOSIS — I2589 Other forms of chronic ischemic heart disease: Secondary | ICD-10-CM | POA: Diagnosis present

## 2013-07-20 DIAGNOSIS — I252 Old myocardial infarction: Secondary | ICD-10-CM

## 2013-07-20 DIAGNOSIS — Z8673 Personal history of transient ischemic attack (TIA), and cerebral infarction without residual deficits: Secondary | ICD-10-CM

## 2013-07-20 DIAGNOSIS — N289 Disorder of kidney and ureter, unspecified: Secondary | ICD-10-CM

## 2013-07-20 DIAGNOSIS — I2510 Atherosclerotic heart disease of native coronary artery without angina pectoris: Secondary | ICD-10-CM | POA: Diagnosis present

## 2013-07-20 DIAGNOSIS — N390 Urinary tract infection, site not specified: Secondary | ICD-10-CM | POA: Diagnosis present

## 2013-07-20 DIAGNOSIS — I255 Ischemic cardiomyopathy: Secondary | ICD-10-CM

## 2013-07-20 DIAGNOSIS — N183 Chronic kidney disease, stage 3 unspecified: Secondary | ICD-10-CM | POA: Diagnosis present

## 2013-07-20 DIAGNOSIS — Z7901 Long term (current) use of anticoagulants: Secondary | ICD-10-CM

## 2013-07-20 DIAGNOSIS — I129 Hypertensive chronic kidney disease with stage 1 through stage 4 chronic kidney disease, or unspecified chronic kidney disease: Secondary | ICD-10-CM | POA: Diagnosis present

## 2013-07-20 DIAGNOSIS — N12 Tubulo-interstitial nephritis, not specified as acute or chronic: Secondary | ICD-10-CM

## 2013-07-20 DIAGNOSIS — E860 Dehydration: Secondary | ICD-10-CM | POA: Diagnosis present

## 2013-07-20 DIAGNOSIS — N189 Chronic kidney disease, unspecified: Secondary | ICD-10-CM

## 2013-07-20 DIAGNOSIS — Z8614 Personal history of Methicillin resistant Staphylococcus aureus infection: Secondary | ICD-10-CM

## 2013-07-20 DIAGNOSIS — Z794 Long term (current) use of insulin: Secondary | ICD-10-CM

## 2013-07-20 DIAGNOSIS — Z79899 Other long term (current) drug therapy: Secondary | ICD-10-CM

## 2013-07-20 DIAGNOSIS — Z9861 Coronary angioplasty status: Secondary | ICD-10-CM

## 2013-07-20 DIAGNOSIS — E119 Type 2 diabetes mellitus without complications: Secondary | ICD-10-CM | POA: Diagnosis present

## 2013-07-20 DIAGNOSIS — I509 Heart failure, unspecified: Secondary | ICD-10-CM | POA: Diagnosis present

## 2013-07-20 DIAGNOSIS — I5022 Chronic systolic (congestive) heart failure: Secondary | ICD-10-CM | POA: Diagnosis present

## 2013-07-20 DIAGNOSIS — I119 Hypertensive heart disease without heart failure: Secondary | ICD-10-CM | POA: Diagnosis present

## 2013-07-20 DIAGNOSIS — I4891 Unspecified atrial fibrillation: Secondary | ICD-10-CM | POA: Diagnosis present

## 2013-07-20 DIAGNOSIS — N179 Acute kidney failure, unspecified: Secondary | ICD-10-CM | POA: Diagnosis present

## 2013-07-20 LAB — CBC WITH DIFFERENTIAL/PLATELET
Basophils Absolute: 0 10*3/uL (ref 0.0–0.1)
Eosinophils Absolute: 0 10*3/uL (ref 0.0–0.7)
Lymphs Abs: 0.4 10*3/uL — ABNORMAL LOW (ref 0.7–4.0)
MCH: 29.1 pg (ref 26.0–34.0)
MCHC: 33.3 g/dL (ref 30.0–36.0)
MCV: 87.5 fL (ref 78.0–100.0)
Monocytes Absolute: 1 10*3/uL (ref 0.1–1.0)
Platelets: 210 10*3/uL (ref 150–400)
RDW: 22.7 % — ABNORMAL HIGH (ref 11.5–15.5)
Smear Review: ADEQUATE

## 2013-07-20 LAB — URINALYSIS, ROUTINE W REFLEX MICROSCOPIC
Glucose, UA: NEGATIVE mg/dL
Ketones, ur: NEGATIVE mg/dL
Protein, ur: 100 mg/dL — AB

## 2013-07-20 LAB — BASIC METABOLIC PANEL
CO2: 22 mEq/L (ref 19–32)
Calcium: 9.3 mg/dL (ref 8.4–10.5)
Creatinine, Ser: 1.67 mg/dL — ABNORMAL HIGH (ref 0.50–1.35)
GFR calc non Af Amer: 39 mL/min — ABNORMAL LOW (ref >90)

## 2013-07-20 LAB — GLUCOSE, CAPILLARY

## 2013-07-20 LAB — URINE MICROSCOPIC-ADD ON

## 2013-07-20 LAB — PROTIME-INR
INR: 2.34 — ABNORMAL HIGH (ref 0.00–1.49)
Prothrombin Time: 24.9 seconds — ABNORMAL HIGH (ref 11.6–15.2)

## 2013-07-20 LAB — CG4 I-STAT (LACTIC ACID): Lactic Acid, Venous: 2.01 mmol/L (ref 0.5–2.2)

## 2013-07-20 MED ORDER — ACETAMINOPHEN 325 MG PO TABS
650.0000 mg | ORAL_TABLET | Freq: Four times a day (QID) | ORAL | Status: DC | PRN
  Administered 2013-07-20: 650 mg via ORAL
  Filled 2013-07-20: qty 2

## 2013-07-20 MED ORDER — AMIODARONE HCL 200 MG PO TABS
200.0000 mg | ORAL_TABLET | Freq: Every day | ORAL | Status: DC
  Administered 2013-07-21 – 2013-07-23 (×3): 200 mg via ORAL
  Filled 2013-07-20 (×3): qty 1

## 2013-07-20 MED ORDER — ONDANSETRON HCL 4 MG/2ML IJ SOLN: 4.0000 mg | Freq: Four times a day (QID) | INTRAMUSCULAR | Status: DC | PRN

## 2013-07-20 MED ORDER — INSULIN ASPART PROT & ASPART (70-30 MIX) 100 UNIT/ML ~~LOC~~ SUSP
10.0000 [IU] | Freq: Two times a day (BID) | SUBCUTANEOUS | Status: DC
  Administered 2013-07-21 – 2013-07-23 (×5): 10 [IU] via SUBCUTANEOUS
  Filled 2013-07-20: qty 10

## 2013-07-20 MED ORDER — CARVEDILOL 12.5 MG PO TABS
12.5000 mg | ORAL_TABLET | Freq: Two times a day (BID) | ORAL | Status: DC
  Administered 2013-07-20 – 2013-07-23 (×6): 12.5 mg via ORAL
  Filled 2013-07-20 (×10): qty 1

## 2013-07-20 MED ORDER — ONDANSETRON HCL 4 MG PO TABS: 4.0000 mg | ORAL_TABLET | Freq: Four times a day (QID) | ORAL | Status: DC | PRN

## 2013-07-20 MED ORDER — WARFARIN SODIUM 2.5 MG PO TABS
2.5000 mg | ORAL_TABLET | ORAL | Status: DC
  Administered 2013-07-21: 17:00:00 2.5 mg via ORAL
  Filled 2013-07-20 (×2): qty 1

## 2013-07-20 MED ORDER — DEXTROSE 5 % IV SOLN
1.0000 g | INTRAVENOUS | Status: DC
  Administered 2013-07-21 – 2013-07-22 (×2): 1 g via INTRAVENOUS
  Filled 2013-07-20 (×3): qty 10

## 2013-07-20 MED ORDER — DEXTROSE 5 % IV SOLN
1.0000 g | Freq: Once | INTRAVENOUS | Status: AC
  Administered 2013-07-20: 1 g via INTRAVENOUS
  Filled 2013-07-20: qty 10

## 2013-07-20 MED ORDER — WARFARIN SODIUM 5 MG PO TABS
5.0000 mg | ORAL_TABLET | ORAL | Status: DC
  Administered 2013-07-22: 5 mg via ORAL
  Filled 2013-07-20: qty 1

## 2013-07-20 MED ORDER — CLOPIDOGREL BISULFATE 75 MG PO TABS
75.0000 mg | ORAL_TABLET | Freq: Every day | ORAL | Status: DC
  Administered 2013-07-20 – 2013-07-23 (×4): 75 mg via ORAL
  Filled 2013-07-20 (×4): qty 1

## 2013-07-20 MED ORDER — HYDROCODONE-ACETAMINOPHEN 10-325 MG PO TABS: 1.0000 | ORAL_TABLET | Freq: Three times a day (TID) | ORAL | Status: DC | PRN

## 2013-07-20 MED ORDER — WARFARIN SODIUM 5 MG PO TABS
5.0000 mg | ORAL_TABLET | Freq: Once | ORAL | Status: AC
  Administered 2013-07-20: 22:00:00 5 mg via ORAL
  Filled 2013-07-20: qty 1

## 2013-07-20 MED ORDER — SODIUM CHLORIDE 0.9 % IV SOLN
INTRAVENOUS | Status: DC
  Administered 2013-07-20: 18:00:00 via INTRAVENOUS

## 2013-07-20 MED ORDER — WARFARIN - PHARMACIST DOSING INPATIENT: Freq: Every day | Status: DC

## 2013-07-20 NOTE — ED Notes (Signed)
Reports having fever/chills and urinary frequency for several days, states this is similar to symptoms of UTI he had in past. no acute distress noted at triage.

## 2013-07-20 NOTE — H&P (Signed)
  I have seen and examined the patient myself, and I have reviewed the note by Alicia Amel, MS 3 and was present during the interview and physical exam.  Please see my separate H&P for additional findings, assessment, and plan.   Signed: Vivi Barrack, MD 07/20/2013, 8:50 PM

## 2013-07-20 NOTE — Progress Notes (Signed)
ANTICOAGULATION CONSULT NOTE - Initial Consult  Pharmacy Consult for Coumadin Indication: atrial fibrillation  Allergies  Allergen Reactions  . Codeine     REACTION: blisters, but able to take hydrocodone    Patient Measurements: Height: 5\' 8"  (172.7 cm) Weight: 232 lb 2.3 oz (105.3 kg) IBW/kg (Calculated) : 68.4 Heparin Dosing Weight:   Vital Signs: Temp: 98.3 F (36.8 C) (09/04 2109) Temp src: Oral (09/04 2109) BP: 153/62 mmHg (09/04 2111) Pulse Rate: 90 (09/04 2111)  Labs:  Recent Labs  07/20/13 1455 07/20/13 1642  HGB 13.3  --   HCT 40.0  --   PLT 210  --   LABPROT  --  24.9*  INR  --  2.34*  CREATININE 1.67*  --     Estimated Creatinine Clearance: 46.4 ml/min (by C-G formula based on Cr of 1.67).   Medical History: Past Medical History  Diagnosis Date  . Type 2 diabetes mellitus   . Coronary atherosclerosis of native coronary artery     a. DES to RCA 03/2009. b. s/p PTCA to RCA for ISR, 05/2010. c. 03/2013 NSTEMI 2/2 severe prox RCA s/p PTCA/DES, med rx for residual dz.  . Hyperlipidemia   . TIA (transient ischemic attack)     Multiple TIAs  . Cardiomyopathy, ischemic     LVEF 25-35%.  . Morbid obesity   . BPH (benign prostatic hypertrophy)   . LBBB (left bundle branch block)     s/p BiV ICD Implanted by Dr Graciela Husbands  . Paroxysmal atrial fibrillation     a. Coumadin discontinue in 2010 when the patient required ASA/Plavix for stenting. b. Coumadin restarted 05/2013, Plavix continued, ASA stopped.   . Chronic kidney disease, stage III (moderate)     Followed by Dr. Fausto Skillern.  . Essential hypertension, benign   . Anemia-chronic     a. Pt reports h/o anemia - was offered IV iron in past by nephrologist but declined and has taken PO instead.  . Aneurysm, cerebral   . MRSA (methicillin resistant Staphylococcus aureus)   . Anemia   . Pulmonary nodule     CXR, 03/2013, [facility] - not described on any subsequent chest x-rays however, could be a vascular structure     Medications:  Scheduled:  . [START ON 07/21/2013] amiodarone  200 mg Oral Daily  . carvedilol  12.5 mg Oral BID WC  . [START ON 07/21/2013] cefTRIAXone (ROCEPHIN)  IV  1 g Intravenous Q24H  . clopidogrel  75 mg Oral Daily  . [START ON 07/21/2013] insulin aspart protamine- aspart  10 Units Subcutaneous BID WC  . [START ON 07/21/2013] warfarin  2.5 mg Oral Q48H  . [START ON 07/22/2013] warfarin  5 mg Oral Q48H  . [START ON 07/21/2013] Warfarin - Pharmacist Dosing Inpatient   Does not apply q1800    Assessment: 72 yr old male presented at the ED with symptoms that "felt like" his last UTI. Pt is on coumadin at home for a.fib. He alternates taking 5 mg and 2.5 mg. His INR is therapeutic at 2.34. He has not taken his dose today. Goal of Therapy:  INR 2-3    Plan: Continue home dose of Coumadin and check daily AM INR.   [name] 07/20/2013,10:07 PM

## 2013-07-20 NOTE — H&P (Signed)
Date: 07/20/2013               Patient Name:  [name] MRN: [id number]  DOB: June 12, 1941 Age / Sex: 72 y.o., male   PCP: [name], MD         Medical Service: Internal Medicine Teaching Service         Attending Physician: Dr. Rocco Serene, MD    First Contact: Dr. Vivi Barrack Pager: [phone]  Second Contact: Dr. Suszanne Conners Pager: [phone]       After Hours (After 5p/  First Contact Pager: [phone]  weekends / holidays): Second Contact Pager: [phone]   Chief Complaint: "I think I have a UTI"  History of Present Illness:  Mr. BRAYLYN KALTER is a 72 y.o. man with a history of NSTEMI s/p PTCA/DES on 03/2013, paroxysmal atrial fibrillation, systolic heart failure, type 2 diabetes mellitus, and stage 3 chronic kidney disease who presents with complaints of urinary frequency, dysuria, right-sided flank pain, and malodorous urine.   His symptoms began one day ago. At home he endorses having subjective fevers, chills, and nausea without vomiting. He notes to [place] that his symptoms feel similar to those that led him to the hospital about 6 weeks ago, when he was admitted for SIRS 2/2 UTI for 4 days. He saw his primary care physician emergently yesterday for these complaints, and received a prescription for an unknown antibiotic. However, apparently his pharmacy refused to fill the medication because it interacted with his amiodarone. He denies chest pain, shortness of breath, orthopnea, PND, leg swelling. No present nausea, vomiting, diarrhea, abdominal pain. He does endorse some decreased food and drink intake over the past day secondary to nausea and his other symptoms. He has continued to take his medications as prescribed, including torsemide 5 mg daily. He weighs himself daily, his weight is stable at 240 pounds. He exercises regularly, including country-style dancing with his wife twice a week. He follows closely with his cardiologist, Dr. Diona Browner. Last office visit was 07/07/13.  Echocardiogram from May of this year demonstrated moderate LVH with LVEF 35-40%, grade 1 diastolic dysfunction, mildly dilated left atrium. He reports good control of his diabetes on Humalog 75/25 with a sliding scale of 15-25 units 2-3 times per day. Self reports his last hemoglobin A1c was 6.4, but records indicate it was 7.3 on 06/14/13.   Current Outpatient Prescriptions on File Prior to Encounter  Medication Sig Dispense Refill  . acetaminophen (TYLENOL) 650 MG CR tablet Take 1,300 mg by mouth 2 (two) times daily.       Marland Kitchen amiodarone (PACERONE) 400 MG tablet Take 200 mg by mouth daily.      . carvedilol (COREG) 12.5 MG tablet Take 12.5 mg by mouth 2 (two) times daily with a meal.      . clopidogrel (PLAVIX) 75 MG tablet Take 75 mg by mouth daily.      . ferrous sulfate 325 (65 FE) MG tablet Take 325 mg by mouth every evening.       . hydrALAZINE (APRESOLINE) 25 MG tablet Take 25 mg by mouth 2 (two) times daily.       Marland Kitchen HYDROcodone-acetaminophen (NORCO) 10-325 MG per tablet Take 1 tablet by mouth every 8 (eight) hours as needed for pain.       Marland Kitchen insulin lispro protamine-insulin lispro (HUMALOG 75/25) (75-25) 100 UNIT/ML SUSP Inject 15-25 Units into the skin 3 (three) times daily after meals. Will inject between 15 and 25 units each  time blood sugar level is checked.      . isosorbide mononitrate (IMDUR) 30 MG 24 hr tablet Take 30 mg by mouth daily.      Marland Kitchen l-methylfolate-B6-B12 (METANX) 3-35-2 MG TABS Take 1 tablet by mouth daily.       Marland Kitchen linagliptin (TRADJENTA) 5 MG TABS tablet Take 5 mg by mouth daily.      . nitroGLYCERIN (NITROSTAT) 0.4 MG SL tablet Place 0.4 mg under the tongue every 5 (five) minutes as needed for chest pain. For chest pain      . potassium chloride (K-DUR) 10 MEQ tablet Take 10 mEq by mouth daily.      Marland Kitchen torsemide (DEMADEX) 10 MG tablet Take 5 mg by mouth every evening.      . warfarin (COUMADIN) 5 MG tablet Take 2.5-5 mg by mouth every evening. 2.5 mg one day, 5 mg the  next day and 2.5 mg the next and so on. Alternates days.      . Zinc 50 MG CAPS Take 50 mg by mouth daily.         Meds: Current Facility-Administered Medications  Medication Dose Route Frequency Provider Last Rate Last Dose  . amiodarone (PACERONE) tablet 200 mg  200 mg Oral Daily Judie Bonus, MD      . Melene Muller ON 07/21/2013] carvedilol (COREG) tablet 12.5 mg  12.5 mg Oral BID WC Judie Bonus, MD      . Melene Muller ON 07/21/2013] cefTRIAXone (ROCEPHIN) 1 g in dextrose 5 % 50 mL IVPB  1 g Intravenous Q24H Judie Bonus, MD      . clopidogrel (PLAVIX) tablet 75 mg  75 mg Oral Daily Judie Bonus, MD      . HYDROcodone-acetaminophen [facility]) 10-325 MG per tablet 1 tablet  1 tablet Oral Q8H PRN Judie Bonus, MD      . Melene Muller ON 07/21/2013] insulin aspart protamine- aspart (NOVOLOG MIX 70/30) injection 10 Units  10 Units Subcutaneous BID WC Judie Bonus, MD      . ondansetron [facility]) tablet 4 mg  4 mg Oral Q6H PRN Judie Bonus, MD       Or  . ondansetron [facility]) injection 4 mg  4 mg Intravenous Q6H PRN Judie Bonus, MD        Allergies: Allergies as of 07/20/2013 - Review Complete 07/20/2013  Allergen Reaction Noted  . Codeine     Past Medical History  Diagnosis Date  . Type 2 diabetes mellitus   . Coronary atherosclerosis of native coronary artery     a. DES to RCA 03/2009. b. s/p PTCA to RCA for ISR, 05/2010. c. 03/2013 NSTEMI 2/2 severe prox RCA s/p PTCA/DES, med rx for residual dz.  . Hyperlipidemia   . TIA (transient ischemic attack)     Multiple TIAs  . Cardiomyopathy, ischemic     LVEF 25-35%.  . Morbid obesity   . BPH (benign prostatic hypertrophy)   . LBBB (left bundle branch block)     s/p BiV ICD Implanted by Dr Graciela Husbands  . Paroxysmal atrial fibrillation     a. Coumadin discontinue in 2010 when the patient required ASA/Plavix for stenting. b. Coumadin restarted 05/2013, Plavix continued, ASA stopped.   . Chronic kidney disease, stage III  (moderate)     Followed by Dr. Fausto Skillern.  . Essential hypertension, benign   . Anemia-chronic     a. Pt reports h/o anemia - was offered IV iron in past by nephrologist  but declined and has taken PO instead.  . Aneurysm, cerebral   . MRSA (methicillin resistant Staphylococcus aureus)   . Anemia   . Pulmonary nodule     CXR, 03/2013, [facility] - not described on any subsequent chest x-rays however, could be a vascular structure   Past Surgical History  Procedure Laterality Date  . Cholecystectomy    . Total knee arthroplasty    . Vasectomy    . Lung surgery    . Icd placement      Medtronic Protecta XT CRT-D   Family History  Problem Relation Age of Onset  . Heart disease Other     family h/o premature cardiovascular disease   History   Social History  . Marital Status: Married    Spouse Name: N/A    Number of Children: N/A  . Years of Education: N/A   Occupational History  . Not on file.   Social History Main Topics  . Smoking status: Former Smoker -- 1.00 packs/day for 6 years    Types: Cigarettes    Quit date: 11/16/1964  . Smokeless tobacco: Former Neurosurgeon    Quit date: 04/15/1965  . Alcohol Use: Yes     Comment: once/month  . Drug Use: No  . Sexual Activity: Not on file   Other Topics Concern  . Not on file   Social History Narrative  . No narrative on file    Review of Systems: Pertinent items are noted in HPI.  Physical Exam: Blood pressure 169/70, pulse 84, temperature 99.4 F (37.4 C), temperature source Oral, resp. rate 22, SpO2 95.00%. Physical Exam  Constitutional: He is oriented to person, place, and time and well-developed, well-nourished, and in no distress.  HENT:  Head: Normocephalic and atraumatic.  Cardiovascular: Normal rate, regular rhythm, normal heart sounds and intact distal pulses.  Exam reveals no gallop and no friction rub.   No murmur heard. Pulmonary/Chest: Effort normal and breath sounds normal. No respiratory distress. He has no  wheezes. He has no rales. He exhibits no tenderness.  Abdominal: Soft. Bowel sounds are normal. He exhibits no distension and no mass. There is no tenderness. There is no rebound and no guarding.  No CVA tenderness. No suprapubic tenderness.  Musculoskeletal: Normal range of motion. He exhibits no edema and no tenderness.  Neurological: He is alert and oriented to person, place, and time. GCS score is 15.  Skin: Skin is warm and dry. No rash noted.  Psychiatric: Mood and affect normal.     Lab results: Basic Metabolic Panel:  Recent Labs  16/10/96 1455  NA 129*  K 4.8  CL 93*  CO2 22  GLUCOSE 198*  BUN 32*  CREATININE 1.67*  CALCIUM 9.3   CBC:  Recent Labs  07/20/13 1455  WBC 14.4*  NEUTROABS 13.0*  HGB 13.3  HCT 40.0  MCV 87.5  PLT 210   CBG:  Recent Labs  07/20/13 1750  GLUCAP 197*   Coagulation:  Recent Labs  07/20/13 1642  LABPROT 24.9*  INR 2.34*   Urinalysis:  Recent Labs  07/20/13 1409  COLORURINE YELLOW  LABSPEC 1.013  PHURINE 5.0  GLUCOSEU NEGATIVE  HGBUR LARGE*  BILIRUBINUR NEGATIVE  KETONESUR NEGATIVE  PROTEINUR 100*  UROBILINOGEN 0.2  NITRITE NEGATIVE  LEUKOCYTESUR LARGE*  Too numerous to count WBC and RBC, few squams, few bacteria.    Assessment & Plan by Problem: Mr. NASHTON BELSON is a 71 y.o. man with a history of NSTEMI s/p PTCA/DES  on 03/2013, paroxysmal atrial fibrillation, systolic heart failure, type 2 diabetes mellitus, and stage 3 chronic kidney disease who presents with complaints of urinary frequency, dysuria, right-sided flank pain, and malodorous urine.   #UTI - Patient's history and lab work suggest cystitis. His urinalysis is remarkable for large leukesterase, too numerous to count WBCs and RBCs, few squamous cells. He does have a white blood count of 14.4 and was febrile to 102.4 in the emergency department, this has normalized (though the patient did receive Tylenol). Pyelonephritis is possible as he also had  nausea at home and presented with complaints of right flank pain, however he has no CVA tenderness on exam and no nausea or vomiting presently.  - Admit to IMTS - Continue IV ceftriaxone - Norco prn pain - Zofran prn nausea - Followup a.m. CBC - Follow up blood, urine cultures - Monitor vital signs  #Acute on chronic kidney injury - Of note his creatinine is moderately elevated to 1.67 (baseline 1.3). Most likely etiology is prerenal secondary to decreased by mouth intake while continuing to take his home torsemide. - Holding diuretics for now, patient is at baseline weight - Followup a.m. BMP  #DM2 - Last A1C 7.3 on 06/14/13. Takes Humolog 75/25 15-25 units 2-3 times per day and linagliptin 5 mg daily at home. - Monitor CBGs 4 times daily - Will give novolog 70/30 10 units BID while admitted - Holding home linagliptin  #Paroxysmal atrial fibrillation - Patient is currently in sinus rhythm on the monitor. He is anticoagulated at home on warfarin, alternating 2.5 and 5 mg daily. INR is therapeutic at 2.34 - Will continue on amiodarone - Coumadin dosing per pharmacy  #HTN - 90-130/40-110s currently. - Continue home Coreg - Holding home hydralazine, imdur for now given several soft pressures  #History of NSTEMI s/p PTCA/DES - No chest pain. - Continue home Plavix  #CHF - EF of 35-40%. Patient's weight has been stable at 240 lbs. No clinical signs of volume overload on exam. Patient has bilateral ventricular ICD placed. - Monitor daily weights - Holding diuretics in the setting of acute on chronic kidney injury - Heart healthy diet  #DVT PPX - home Coumadin   Dispo: Disposition is deferred at this time, awaiting improvement of current medical problems. Anticipated discharge in approximately 1-3 day(s).   The patient does have a current PCP [name], MD) and does need an [facility] hospital follow-up appointment after discharge.  The patient does not have transportation limitations  that hinder transportation to clinic appointments.  Signed: Vivi Barrack, MD 07/20/2013, 8:13 PM

## 2013-07-20 NOTE — H&P (Signed)
[facility] Date: 07/20/2013  Patient name: [name] Medical record number: [id number] Date of birth: [birth year] Age: 72 y.o. Gender: male PCP: [name], MD  Medical Service:    Attending physician:      Chief Complaint:  Patient thinks he has a UTI  History of Present Illness:  [name] is a 72 year old male with a history of NSTEMI s/p PTCA/DES 03/2013,  paroxysmal atrial fibrillation, systolic heart failure, type 2 diabetes mellitus, and stage 3 chronic kidney disease who presented to the ED today with a 1 day history of urinary frequency, dysuria, right-sided flank pain, and malodorous urine.  He endorses fevers, chills, and nausea without vomiting. He feels that his symptoms are similar to the urinary symptoms he had about 6 weeks ago when he had a 4-day hospitalization for SIRS secondary to UTI. He visited his primary care doctor yesterday for sinus congestion and UTI, who prescribed an antibiotic that would empirically cover both problems; however, the pharmacist would not release the medication because it was contraindicated with amiodarone. He has not experienced any chest pain, shortness of breath, or leg swelling. He continues to take his torsemide 5 mg daily even though he has not been eating or drinking much over the past 24 hours. His weight has been stable at 240 lbs. His INR is therapeutic at 2.34. His diabetes mellitus has been well-controlled on Humolog 75/25 15-25 units 2-3 times per day. His last Hb A1c was 6.4.    Meds: Current Outpatient Rx  Name  Route  Sig  Dispense  Refill  . acetaminophen (TYLENOL) 650 MG CR tablet   Oral   Take 1,300 mg by mouth 2 (two) times daily.          Marland Kitchen amiodarone (PACERONE) 400 MG tablet   Oral   Take 200 mg by mouth daily.         . carvedilol (COREG) 12.5 MG tablet   Oral   Take 12.5 mg by mouth 2 (two) times daily with a meal.         . clopidogrel (PLAVIX) 75 MG tablet   Oral   Take 75 mg  by mouth daily.         . ferrous sulfate 325 (65 FE) MG tablet   Oral   Take 325 mg by mouth every evening.          . hydrALAZINE (APRESOLINE) 25 MG tablet   Oral   Take 25 mg by mouth 2 (two) times daily.          Marland Kitchen HYDROcodone-acetaminophen (NORCO) 10-325 MG per tablet   Oral   Take 1 tablet by mouth every 8 (eight) hours as needed for pain.          Marland Kitchen insulin lispro protamine-insulin lispro (HUMALOG 75/25) (75-25) 100 UNIT/ML SUSP   Subcutaneous   Inject 15-25 Units into the skin 3 (three) times daily after meals. Will inject between 15 and 25 units each time blood sugar level is checked.         . isosorbide mononitrate (IMDUR) 30 MG 24 hr tablet   Oral   Take 30 mg by mouth daily.         Marland Kitchen l-methylfolate-B6-B12 (METANX) 3-35-2 MG TABS   Oral   Take 1 tablet by mouth daily.          Marland Kitchen linagliptin (TRADJENTA) 5 MG TABS tablet   Oral   Take 5 mg by mouth daily.         Marland Kitchen  nitroGLYCERIN (NITROSTAT) 0.4 MG SL tablet   Sublingual   Place 0.4 mg under the tongue every 5 (five) minutes as needed for chest pain. For chest pain         . potassium chloride (K-DUR) 10 MEQ tablet   Oral   Take 10 mEq by mouth daily.         Marland Kitchen torsemide (DEMADEX) 10 MG tablet   Oral   Take 5 mg by mouth every evening.         . warfarin (COUMADIN) 5 MG tablet   Oral   Take 2.5-5 mg by mouth every evening. 2.5 mg one day, 5 mg the next day and 2.5 mg the next and so on. Alternates days.         . Zinc 50 MG CAPS   Oral   Take 50 mg by mouth daily.            Allergies: Allergies as of 07/20/2013 - Review Complete 07/20/2013  Allergen Reaction Noted  . Codeine     Past Medical History  Diagnosis Date  . Type 2 diabetes mellitus   . Coronary atherosclerosis of native coronary artery     a. DES to RCA 03/2009. b. s/p PTCA to RCA for ISR, 05/2010. c. 03/2013 NSTEMI 2/2 severe prox RCA s/p PTCA/DES, med rx for residual dz.  . Hyperlipidemia   . TIA (transient  ischemic attack)     Multiple TIAs  . Cardiomyopathy, ischemic     LVEF 25-35%.  . Morbid obesity   . BPH (benign prostatic hypertrophy)   . LBBB (left bundle branch block)     s/p BiV ICD Implanted by Dr Graciela Husbands  . Paroxysmal atrial fibrillation     a. Coumadin discontinue in 2010 when the patient required ASA/Plavix for stenting. b. Coumadin restarted 05/2013, Plavix continued, ASA stopped.   . Chronic kidney disease, stage III (moderate)     Followed by Dr. Fausto Skillern.  . Essential hypertension, benign   . Anemia-chronic     a. Pt reports h/o anemia - was offered IV iron in past by nephrologist but declined and has taken PO instead.  . Aneurysm, cerebral   . MRSA (methicillin resistant Staphylococcus aureus)   . Anemia   . Pulmonary nodule     CXR, 03/2013, [facility] - not described on any subsequent chest x-rays however, could be a vascular structure   Past Surgical History  Procedure Laterality Date  . Cholecystectomy    . Total knee arthroplasty    . Vasectomy    . Lung surgery    . Icd placement      Medtronic Protecta XT CRT-D   Family History  Problem Relation Age of Onset  . Heart disease Other     family h/o premature cardiovascular disease   History   Social History  . Marital Status: Married    Spouse Name: N/A    Number of Children: N/A  . Years of Education: N/A   Occupational History  . Not on file.   Social History Main Topics  . Smoking status: Former Smoker -- 1.00 packs/day for 6 years    Types: Cigarettes    Quit date: 11/16/1964  . Smokeless tobacco: Former Neurosurgeon    Quit date: 04/15/1965  . Alcohol Use: Yes     Comment: once/month  . Drug Use: No  . Sexual Activity: Not on file   Other Topics Concern  . Not on file   Social History Narrative  .  No narrative on file    Review of Systems: Pertinent items are noted in HPI.  Physical Exam: Blood pressure 104/49, pulse 75, temperature 99.4 F (37.4 C), temperature source Oral, resp. rate 21,  SpO2 95.00%. General appearance: alert, no distress and moderately obese Back: symmetric, no curvature. ROM normal. No CVA tenderness. Lungs: diminished breath sounds bilaterally Heart: regular rate and rhythm, S1, S2 normal, no murmur, click, rub or gallop Abdomen: soft, non-tender; bowel sounds normal; no masses,  no organomegaly and no suprapubic tenderness Extremities: extremities normal, atraumatic, no cyanosis or edema Pulses: 2+ and symmetric  Lab results: BMET    Component Value Date/Time   NA 129* 07/20/2013 1455   K 4.8 07/20/2013 1455   CL 93* 07/20/2013 1455   CO2 22 07/20/2013 1455   GLUCOSE 198* 07/20/2013 1455   BUN 32* 07/20/2013 1455   CREATININE 1.67* 07/20/2013 1455   CALCIUM 9.3 07/20/2013 1455   GFRNONAA 39* 07/20/2013 1455   GFRAA 45* 07/20/2013 1455   Urinalysis    Component Value Date/Time   COLORURINE YELLOW 07/20/2013 1409   APPEARANCEUR CLOUDY* 07/20/2013 1409   LABSPEC 1.013 07/20/2013 1409   PHURINE 5.0 07/20/2013 1409   GLUCOSEU NEGATIVE 07/20/2013 1409   HGBUR LARGE* 07/20/2013 1409   BILIRUBINUR NEGATIVE 07/20/2013 1409   KETONESUR NEGATIVE 07/20/2013 1409   PROTEINUR 100* 07/20/2013 1409   UROBILINOGEN 0.2 07/20/2013 1409   NITRITE NEGATIVE 07/20/2013 1409   LEUKOCYTESUR LARGE* 07/20/2013 1409    Assessment & Plan by Problem:  Mr. Solum is a 72 year old male with a history of NSTEMI s/p PTCA/DES 03/2013,  paroxysmal atrial fibrillation, systolic heart failure, type 2 diabetes mellitus, and stage 3 chronic kidney disease who presented to the ED today with a 1 day history suggesting urinary tract infection.  # Urinary Tract Infection: Given the patient's history of urinary frequency, dysuria, fever, chills, and nausea, the patient likely has cystitis.  It is less likely to be pyelonephritis since neither CVA tenderness nor suprapubic tenderness could be elicited on physical exam and patient does not appear acutely ill or toxic. His clinical picture in conjunction with WBC 14.4, febrile  to 102.59F, and urinary analysis positive for WBC and RBC, makes cystitis likely. However in the setting of a moderately elevated creatinine (1.67 up from 1.42 during his last hospitalization, and baseline around 1.3) and stage 3 chronic kidney disease, as well as his recent history of SIRS secondary to UTI, we will admit him for observation and begin administration of antibiotics.  Ceftriaxone 1g IV q24 hours  Will observe until afebrile 24 hours  #Stage 3 Chronic Kidney Disease:  The patient's creatinine is 1.67 up from 1.42 from his last hospitalization, however his baseline creatinine appears to be closer to 1.3 looking back to June 2014.  This is less likely to represent acute kidney injury. Will hold diuretics as patient is close to his baseline weight.  #Diabetes Mellitus Type 2: The patient's last HbA1C was 6.4 and he feels his blood glucose levels are well controlled on Humolog 75/25 15-25 units 2-3 times per day and linagliptin 5 mg daily.  Monitor blood glucose. Will give novolog 70/30 10 units BID while admitted.  #Paroxysmal Atrial Fibrillation: Patient is currently in sinus. Patient takes warfarin alternating 2.5 and 5 mg every other day. His INR is within the therapeutic range at 2.34.  We will continue amiodarone.  Pharmacy will monitor INR and dose coumadin while inpatient  #Systolic Heart Failure: Last echocardiogram from  03/2013 showed EF of 35-40%. Patient's weight has been stable at 240 lbs and he has had no lower extremity edema. He denies chest pain or dyspnea on exertion. He continued to take his torsemide 5 mg daily even though he has not been eating or drinking much over the past 24 hours. Patient has bilateral ventricular ICD placed.  Monitor weight  Holding diuretics today  #DVT Prophylaxis:  No additional DVT prophylaxis needed because patient is on coumadin.  This is a Psychologist, occupational Note.  The care of the patient was discussed with Dr. Claudell Kyle and the assessment and  plan was formulated with their assistance.  Please see their note for official documentation of the patient encounter.   Signed: Bruna Potter 07/20/2013, 6:20 PM

## 2013-07-20 NOTE — ED Provider Notes (Signed)
CSN: [id number]     Arrival date & time 07/20/13  1354 History   First MD Initiated Contact with Patient 07/20/13 1540     Chief Complaint  Patient presents with  . Urinary Frequency  . Fever   (Consider location/radiation/quality/duration/timing/severity/associated sxs/prior Treatment) HPI Comments: [name] is a 72 y.o. Male who states that he began having right flank pain, at 3 AM this morning. The pain is persistent. He has had chills today. He has had nausea and decreased appetite today. There's been no vomiting, or diarrhea. He has had urinary frequency. Today, without dysuria. Symptoms are similar to prior UTIs twice recently. He does not have a urologist. He sees nephrology for chronic renal insufficiency. He denies chest pain, weakness, dizziness, or abdominal pain. He took hydrocodone with partial relief of the pain. There no other known modifying factors  Patient is a 72 y.o. male presenting with frequency and fever. The history is provided by the patient.  Urinary Frequency  Fever   Past Medical History  Diagnosis Date  . Type 2 diabetes mellitus   . Coronary atherosclerosis of native coronary artery     a. DES to RCA 03/2009. b. s/p PTCA to RCA for ISR, 05/2010. c. 03/2013 NSTEMI 2/2 severe prox RCA s/p PTCA/DES, med rx for residual dz.  . Hyperlipidemia   . TIA (transient ischemic attack)     Multiple TIAs  . Cardiomyopathy, ischemic     LVEF 25-35%.  . Morbid obesity   . BPH (benign prostatic hypertrophy)   . LBBB (left bundle branch block)     s/p BiV ICD Implanted by Dr Graciela Husbands  . Paroxysmal atrial fibrillation     a. Coumadin discontinue in 2010 when the patient required ASA/Plavix for stenting. b. Coumadin restarted 05/2013, Plavix continued, ASA stopped.   . Chronic kidney disease, stage III (moderate)     Followed by Dr. Fausto Skillern.  . Essential hypertension, benign   . Anemia-chronic     a. Pt reports h/o anemia - was offered IV iron in past by nephrologist but  declined and has taken PO instead.  . Aneurysm, cerebral   . MRSA (methicillin resistant Staphylococcus aureus)   . Anemia   . Pulmonary nodule     CXR, 03/2013, [facility] - not described on any subsequent chest x-rays however, could be a vascular structure   Past Surgical History  Procedure Laterality Date  . Cholecystectomy    . Total knee arthroplasty    . Vasectomy    . Lung surgery    . Icd placement      Medtronic Protecta XT CRT-D   Family History  Problem Relation Age of Onset  . Heart disease Other     family h/o premature cardiovascular disease   History  Substance Use Topics  . Smoking status: Former Smoker -- 1.00 packs/day for 6 years    Types: Cigarettes    Quit date: 11/16/1964  . Smokeless tobacco: Former Neurosurgeon    Quit date: 04/15/1965  . Alcohol Use: Yes     Comment: once/month    Review of Systems  Constitutional: Positive for fever.  Genitourinary: Positive for frequency.  All other systems reviewed and are negative.    Allergies  Codeine  Home Medications   Current Outpatient Rx  Name  Route  Sig  Dispense  Refill  . acetaminophen (TYLENOL) 650 MG CR tablet   Oral   Take 1,300 mg by mouth 2 (two) times daily.          Marland Kitchen  amiodarone (PACERONE) 400 MG tablet   Oral   Take 200 mg by mouth daily.         . carvedilol (COREG) 12.5 MG tablet   Oral   Take 12.5 mg by mouth 2 (two) times daily with a meal.         . clopidogrel (PLAVIX) 75 MG tablet   Oral   Take 75 mg by mouth daily.         . ferrous sulfate 325 (65 FE) MG tablet   Oral   Take 325 mg by mouth every evening.          . hydrALAZINE (APRESOLINE) 25 MG tablet   Oral   Take 25 mg by mouth 2 (two) times daily.          Marland Kitchen HYDROcodone-acetaminophen (NORCO) 10-325 MG per tablet   Oral   Take 1 tablet by mouth every 8 (eight) hours as needed for pain.          Marland Kitchen insulin lispro protamine-insulin lispro (HUMALOG 75/25) (75-25) 100 UNIT/ML SUSP   Subcutaneous   Inject  15-25 Units into the skin 3 (three) times daily after meals. Will inject between 15 and 25 units each time blood sugar level is checked.         . isosorbide mononitrate (IMDUR) 30 MG 24 hr tablet   Oral   Take 30 mg by mouth daily.         Marland Kitchen l-methylfolate-B6-B12 (METANX) 3-35-2 MG TABS   Oral   Take 1 tablet by mouth daily.          Marland Kitchen linagliptin (TRADJENTA) 5 MG TABS tablet   Oral   Take 5 mg by mouth daily.         . nitroGLYCERIN (NITROSTAT) 0.4 MG SL tablet   Sublingual   Place 0.4 mg under the tongue every 5 (five) minutes as needed for chest pain. For chest pain         . potassium chloride (K-DUR) 10 MEQ tablet   Oral   Take 10 mEq by mouth daily.         Marland Kitchen torsemide (DEMADEX) 10 MG tablet   Oral   Take 5 mg by mouth every evening.         . warfarin (COUMADIN) 5 MG tablet   Oral   Take 2.5-5 mg by mouth every evening. 2.5 mg one day, 5 mg the next day and 2.5 mg the next and so on. Alternates days.         . Zinc 50 MG CAPS   Oral   Take 50 mg by mouth daily.           BP 113/60  Pulse 83  Temp(Src) 99.5 F (37.5 C) (Oral)  Resp 20  SpO2 93% Physical Exam  Nursing note and vitals reviewed. Constitutional: He is oriented to person, place, and time. He appears well-developed and well-nourished. No distress.  HENT:  Head: Normocephalic and atraumatic.  Right Ear: External ear normal.  Left Ear: External ear normal.  Eyes: Conjunctivae and EOM are normal. Pupils are equal, round, and reactive to light.  Neck: Normal range of motion and phonation normal. Neck supple.  Cardiovascular: Normal rate, regular rhythm, normal heart sounds and intact distal pulses.   Pulmonary/Chest: Effort normal and breath sounds normal. He exhibits no bony tenderness.  Abdominal: Soft. Normal appearance. He exhibits no mass. There is no tenderness. There is no guarding.  Genitourinary:  Mild right  costovertebral angle tenderness with percussion   Musculoskeletal: Normal range of motion.  Neurological: He is alert and oriented to person, place, and time. He has normal strength. No cranial nerve deficit or sensory deficit. He exhibits normal muscle tone. Coordination normal.  Skin: Skin is warm, dry and intact.  Psychiatric: He has a normal mood and affect. His behavior is normal. Judgment and thought content normal.    ED Course  Procedures (including critical care time)  Medications  acetaminophen (TYLENOL) tablet 650 mg (650 mg Oral Given 07/20/13 1413)  0.9 %  sodium chloride infusion (not administered)  cefTRIAXone (ROCEPHIN) 1 g in dextrose 5 % 50 mL IVPB (not administered)    Patient Vitals for the past 24 hrs:  BP Temp Temp src Pulse Resp SpO2  07/20/13 1628 113/60 mmHg 99.5 F (37.5 C) Oral 83 20 93 %  07/20/13 1406 173/62 mmHg 102.4 F (39.1 C) Oral 83 18 97 %    4:54 PM-Consult complete with [facility] Resident. Patient case explained and discussed. She agrees to admit patient for further evaluation and treatment. She declined my offer to write temporary admission orders. Call ended at 1708  Labs Review Labs Reviewed  CBC WITH DIFFERENTIAL - Abnormal; Notable for the following:    WBC 14.4 (*)    RDW 22.7 (*)    Neutrophils Relative % 90 (*)    Lymphocytes Relative 3 (*)    Neutro Abs 13.0 (*)    Lymphs Abs 0.4 (*)    All other components within normal limits  BASIC METABOLIC PANEL - Abnormal; Notable for the following:    Sodium 129 (*)    Chloride 93 (*)    Glucose, Bld 198 (*)    BUN 32 (*)    Creatinine, Ser 1.67 (*)    GFR calc non Af Amer 39 (*)    GFR calc Af Amer 45 (*)    All other components within normal limits  URINALYSIS, ROUTINE W REFLEX MICROSCOPIC - Abnormal; Notable for the following:    APPearance CLOUDY (*)    Hgb urine dipstick LARGE (*)    Protein, ur 100 (*)    Leukocytes, UA LARGE (*)    All other components within normal limits  URINE MICROSCOPIC-ADD ON - Abnormal; Notable for the  following:    Squamous Epithelial / LPF FEW (*)    Bacteria, UA FEW (*)    All other components within normal limits  URINE CULTURE  PROTIME-INR  CG4 I-STAT (LACTIC ACID)     MDM   1. Pyelonephritis   2. Renal insufficiency   3. Dehydration    UTI with flank pain and fever; consistent with pyelonephritis. His multiple comorbidities. He has mild dehydration with elevation of BUN and creatinine from his baseline. He'll need to be admitted for monitoring, and stabilization.   Plan: Admit   Flint Melter, MD 07/20/13 [phone]

## 2013-07-21 ENCOUNTER — Inpatient Hospital Stay (HOSPITAL_COMMUNITY): Payer: Medicare Other

## 2013-07-21 LAB — CBC
HCT: 35.9 % — ABNORMAL LOW (ref 39.0–52.0)
Hemoglobin: 12 g/dL — ABNORMAL LOW (ref 13.0–17.0)
MCH: 29.6 pg (ref 26.0–34.0)
MCHC: 33.4 g/dL (ref 30.0–36.0)
MCV: 88.6 fL (ref 78.0–100.0)
Platelets: 163 10*3/uL (ref 150–400)
RBC: 4.05 MIL/uL — ABNORMAL LOW (ref 4.22–5.81)
RDW: 23.3 % — ABNORMAL HIGH (ref 11.5–15.5)
WBC: 14.7 10*3/uL — ABNORMAL HIGH (ref 4.0–10.5)

## 2013-07-21 LAB — BASIC METABOLIC PANEL
BUN: 39 mg/dL — ABNORMAL HIGH (ref 6–23)
BUN: 42 mg/dL — ABNORMAL HIGH (ref 6–23)
CO2: 22 mEq/L (ref 19–32)
Calcium: 8.6 mg/dL (ref 8.4–10.5)
Calcium: 8.4 mg/dL (ref 8.4–10.5)
Chloride: 91 mEq/L — ABNORMAL LOW (ref 96–112)
Creatinine, Ser: 2.16 mg/dL — ABNORMAL HIGH (ref 0.50–1.35)
Creatinine, Ser: 2.05 mg/dL — ABNORMAL HIGH (ref 0.50–1.35)
GFR calc Af Amer: 33 mL/min — ABNORMAL LOW (ref >90)
GFR calc Af Amer: 35 mL/min — ABNORMAL LOW (ref >90)
GFR calc non Af Amer: 29 mL/min — ABNORMAL LOW (ref >90)
GFR calc non Af Amer: 30 mL/min — ABNORMAL LOW (ref >90)
Glucose, Bld: 224 mg/dL — ABNORMAL HIGH (ref 70–99)
Glucose, Bld: 216 mg/dL — ABNORMAL HIGH (ref 70–99)
Potassium: 4.8 mEq/L (ref 3.5–5.1)
Sodium: 126 mEq/L — ABNORMAL LOW (ref 135–145)

## 2013-07-21 LAB — GLUCOSE, CAPILLARY
Glucose-Capillary: 177 mg/dL — ABNORMAL HIGH (ref 70–99)
Glucose-Capillary: 182 mg/dL — ABNORMAL HIGH (ref 70–99)
Glucose-Capillary: 162 mg/dL — ABNORMAL HIGH (ref 70–99)

## 2013-07-21 LAB — URINE CULTURE

## 2013-07-21 LAB — CREATININE, URINE, RANDOM: Creatinine, Urine: 219.69 mg/dL

## 2013-07-21 LAB — PROTIME-INR: INR: 2.04 — ABNORMAL HIGH (ref 0.00–1.49)

## 2013-07-21 LAB — SODIUM, URINE, RANDOM: Sodium, Ur: 17 mEq/L

## 2013-07-21 MED ORDER — CHLORHEXIDINE GLUCONATE CLOTH 2 % EX PADS
6.0000 | MEDICATED_PAD | Freq: Every day | CUTANEOUS | Status: DC
  Administered 2013-07-21 – 2013-07-23 (×3): 6 via TOPICAL

## 2013-07-21 MED ORDER — MUPIROCIN 2 % EX OINT
1.0000 "application " | TOPICAL_OINTMENT | Freq: Two times a day (BID) | CUTANEOUS | Status: DC
  Administered 2013-07-21 – 2013-07-23 (×5): 1 via NASAL
  Filled 2013-07-21 (×2): qty 22

## 2013-07-21 MED ORDER — SODIUM CHLORIDE 0.9 % IV BOLUS (SEPSIS)
500.0000 mL | Freq: Once | INTRAVENOUS | Status: AC
  Administered 2013-07-21: 16:00:00 500 mL via INTRAVENOUS

## 2013-07-21 NOTE — H&P (Signed)
I saw and evaluated the patient. I personally confirmed the key portions of Dr. Doristine Locks history and exam and reviewed pertinent patient test results. The assessment, diagnosis, and plan were reviewed with the housestaff and I agree with the documentation in the resident's note.  [name] clinical presentation was consistent with a pyelonephritis which is symptomatically improving.  Unfortunately, his renal function continues to decline and he has an acute on chronic kidney injury.  We will further evaluate with a FeNa to assess for a pre-renal process given nausea and vomiting with continued loop diuretics, and a bladder scan and renal ultrasound to assess for a post obstructive process in a man with recurrent cystitis and symptoms consistent with a bladder outlet obstruction (frequency, dribbling, nocturia, etc).  Further therapy will be based upon this assessment.  If he is found to be pre-renal will gently hydrate.  I suspect he will require another 24 hours to evaluate and treat the underlying cause of his acute on chronic kidney injury.

## 2013-07-21 NOTE — Progress Notes (Signed)
Medical Student Daily Progress Note  Subjective: [name] is a 72 year old male who was admitted from the ED yesterday with signs and symptoms of UTI, including dysuria, urinary frequency, malodorous urine, and right-sided flank pain.  He had no acute events overnight. This morning he endorses some nausea, low urine output, urinary frequency, dysuria, and a sensation of urinary retention, but no longer feels flank pain.  Although BPH was listed on his problem list, he does not normally have weak stream, oliguria, or sensation of urinary retention, but has regular nocturia 1x nightly.  Objective: Vital signs in last 24 hours: Filed Vitals:   07/20/13 2109 07/20/13 2110 07/20/13 2111 07/21/13 0642  BP: 146/71 152/74 153/62 147/79  Pulse: 85 85 90 67  Temp: 98.3 F (36.8 C)   98.5 F (36.9 C)  TempSrc: Oral   Oral  Resp: 22     Height: 5\' 8"  (1.727 m)     Weight: 105.3 kg (232 lb 2.3 oz)   105.416 kg (232 lb 6.4 oz)  SpO2: 94%   95%   Weight change:   Intake/Output Summary (Last 24 hours) at 07/21/13 1130 Last data filed at 07/21/13 1103  Gross per 24 hour  Intake      0 ml  Output    250 ml  Net   -250 ml   Physical Exam: BP 147/79  Pulse 67  Temp(Src) 98.5 F (36.9 C) (Oral)  Resp 22  Ht 5\' 8"  (1.727 m)  Wt 105.416 kg (232 lb 6.4 oz)  BMI 35.34 kg/m2  SpO2 95%  General: Sitting up in chair, in no acute distress. Heart: Regular rate and rhythm. Normal S1 and S2. No murmurs, rubs, or gallops. Lungs: Clear to auscultation bilaterally. Normal work of breathing. Abdomen: Soft, obese, nondistended. Mild RLQ tenderness. GU: No CVA tenderness. No suprapubic tenderness.  Extremities: No lower extremity edema.  Lab Results: Basic Metabolic Panel:  Recent Labs Lab 07/20/13 1455 07/21/13 0427  NA 129* 126*  K 4.8 4.8  CL 93* 91*  CO2 22 22  GLUCOSE 198* 224*  BUN 32* 39*  CREATININE 1.67* 2.16*  CALCIUM 9.3 8.6    CBC:  Recent Labs Lab 07/20/13 1455  07/21/13 0427  WBC 14.4* 14.7*  NEUTROABS 13.0*  --   HGB 13.3 12.0*  HCT 40.0 35.9*  MCV 87.5 88.6  PLT 210 163   CBG:  Recent Labs Lab 07/20/13 1750 07/20/13 2107 07/21/13 0800  GLUCAP 197* 244* 177*   Coagulation:  Recent Labs Lab 07/20/13 1642 07/21/13 0427  LABPROT 24.9* 22.4*  INR 2.34* 2.04*   Anemia Panel: No results found for this basename: VITAMINB12, FOLATE, FERRITIN, TIBC, IRON, RETICCTPCT,  in the last 168 hours Urine Drug Screen: Drugs of Abuse  No results found for this basename: labopia, cocainscrnur, labbenz, amphetmu, thcu, labbarb    Alcohol Level: No results found for this basename: ETH,  in the last 168 hours Urinalysis:  Recent Labs Lab 07/20/13 1409  COLORURINE YELLOW  LABSPEC 1.013  PHURINE 5.0  GLUCOSEU NEGATIVE  HGBUR LARGE*  BILIRUBINUR NEGATIVE  KETONESUR NEGATIVE  PROTEINUR 100*  UROBILINOGEN 0.2  NITRITE NEGATIVE  LEUKOCYTESUR LARGE*   Micro Results: Recent Results (from the past 240 hour(s))  MRSA PCR SCREENING     Status: Abnormal   Collection Time    07/20/13  8:31 PM      Result Value Range Status   MRSA by PCR POSITIVE (*) NEGATIVE Final   Comment:  The GeneXpert MRSA Assay (FDA     approved for NASAL specimens     only), is one component of a     comprehensive MRSA colonization     surveillance program. It is not     intended to diagnose MRSA     infection nor to guide or     monitor treatment for     MRSA infections.     RESULT CALLED TO, READ BACK BY AND VERIFIED WITH:     A [facility] RN 2212 07/20/13 A BROWNING   Studies/Results: No results found.  Scheduled Meds: . amiodarone  200 mg Oral Daily  . carvedilol  12.5 mg Oral BID WC  . cefTRIAXone (ROCEPHIN)  IV  1 g Intravenous Q24H  . Chlorhexidine Gluconate Cloth  6 each Topical Q0600  . clopidogrel  75 mg Oral Daily  . insulin aspart protamine- aspart  10 Units Subcutaneous BID WC  . mupirocin ointment  1 application Nasal BID  . warfarin   2.5 mg Oral Q48H  . [START ON 07/22/2013] warfarin  5 mg Oral Q48H  . Warfarin - Pharmacist Dosing Inpatient   Does not apply q1800   Continuous Infusions:  PRN Meds:.HYDROcodone-acetaminophen, ondansetron (ZOFRAN) IV, ondansetron  Assessment/Plan: [name] is a 72 y.o. man with a history of NSTEMI s/p PTCA/DES on 03/2013, paroxysmal atrial fibrillation, systolic heart failure, type 2 diabetes mellitus, and stage 3 chronic kidney disease who presents with complaints of urinary frequency, dysuria, right-sided flank pain, and malodorous urine.   #UTI - Patients history of BPH, symptoms of inability to empty his bladder, and weak urinary stream in the setting of oliguria and rising creatinine (2.16 up from 1.67 on admission) suggest that this could be an obstructive process versus prerenal etiology. - Renal US - Planned to bladder scan and subsequently straight cath for retention volume greater than 100 ml. However, the patient spontaneously voided 250 mL later this AM and residual volume measured at 13 mL, thus no cath was needed. - Continue IV ceftriaxone  - Norco prn pain  - Zofran prn nausea  - Follow up a.m. CBC  - Follow up blood, urine cultures  - Monitor vital signs   #Acute on chronic kidney injury - This morning his creatinine is 2.16  elevated to 1.67 on admission (baseline 1.3).   Etiology is postrenal (see above) versus prerenal secondary to decreased by mouth intake while continuing to take his home torsemide  - Renal ultrasound - Holding diuretics for now, patient is at baseline weight  - Followup a.m. BMP   #DM2 - Last A1C 7.3 on 06/14/13. Takes Humolog 75/25 15-25 units 2-3 times per day and linagliptin 5 mg daily at home.  - Monitor CBGs 4 times daily  - Will give novolog 70/30 10 units BID while admitted  - Holding home linagliptin   #Paroxysmal atrial fibrillation - Patient is currently in sinus rhythm on the monitor. He is anticoagulated at home on warfarin,  alternating 2.5 and 5 mg daily. INR is therapeutic at 2.34  - Will continue on amiodarone  - Coumadin dosing per pharmacy   #HTN - 90-130/40-110s currently.  - Continue home Coreg  - Holding home hydralazine, imdur for now given several soft pressures   #History of NSTEMI s/p PTCA/DES - No chest pain.  - Continue home Plavix   #CHF - EF of 35-40%. Patient's weight has been stable at 240 lbs. No clinical signs of volume overload on exam. Patient has  bilateral ventricular ICD placed.  - Monitor daily weights  - Holding diuretics in the setting of acute on chronic kidney injury  - Heart healthy diet   #DVT PPX - home Coumadin   Dispo: Disposition is deferred at this time, awaiting improvement of current medical problems. Anticipated discharge in approximately 1-3 day(s).     LOS: 1 day   This is a Psychologist, occupational Note.  The care of the patient was discussed with Dr. Claudell Kyle and the assessment and plan formulated with their assistance.  Please see their attached note for official documentation of the daily encounter.  [name] 07/21/2013, 11:30 AM

## 2013-07-21 NOTE — Progress Notes (Signed)
Subjective: 72 year old male who was admitted from the ED yesterday with signs and symptoms of UTI, including dysuria, urinary frequency, malodorous urine, and right-sided flank pain.  He was seen this morning at the bedside. He had no acute events overnight. This morning he endorses some mild nausea, no vomiting. He also reports low urine output despite drinking copious fluids, and describes some urinary frequency, mild dysuria, and a sensation of urinary retention. He continues to deny flank pain. Although BPH was listed on his problem list, after questioning he denies a history of  weak stream, oliguria, or sensation of urinary retention at home. He reports nocturia 1x nightly at home.  Objective: Vital signs in last 24 hours: Filed Vitals:   07/20/13 2109 07/20/13 2110 07/20/13 2111 07/21/13 0642  BP: 146/71 152/74 153/62 147/79  Pulse: 85 85 90 67  Temp: 98.3 F (36.8 C)   98.5 F (36.9 C)  TempSrc: Oral   Oral  Resp: 22     Height: 5\' 8"  (1.727 m)     Weight: 232 lb 2.3 oz (105.3 kg)   232 lb 6.4 oz (105.416 kg)  SpO2: 94%   95%   Weight change:   Intake/Output Summary (Last 24 hours) at 07/21/13 1305 Last data filed at 07/21/13 1103  Gross per 24 hour  Intake      0 ml  Output    250 ml  Net   -250 ml    Physical Exam  Constitutional: He is oriented to person, place, and time and well-developed, well-nourished, and in no distress.  HENT:  Head: Normocephalic and atraumatic.  Cardiovascular: Normal rate, regular rhythm, normal heart sounds and intact distal pulses. Exam reveals no gallop and no friction rub.  No murmur heard.  Pulmonary/Chest: Effort normal and breath sounds normal. No respiratory distress. He has no wheezes. He has no rales. He exhibits no tenderness.  Abdominal: Soft. Bowel sounds are normal. He exhibits no distension and no mass. There is no tenderness. There is no rebound and no guarding.  No CVA tenderness. No suprapubic tenderness.    Musculoskeletal: Normal range of motion. He exhibits no edema and no tenderness.  Neurological: He is alert and oriented to person, place, and time. GCS score is 15.  Skin: Skin is warm and dry. No rash noted.  Psychiatric: Mood and affect normal.    Lab Results: Basic Metabolic Panel:  Recent Labs Lab 07/20/13 1455 07/21/13 0427  NA 129* 126*  K 4.8 4.8  CL 93* 91*  CO2 22 22  GLUCOSE 198* 224*  BUN 32* 39*  CREATININE 1.67* 2.16*  CALCIUM 9.3 8.6  Anion gap 14 > 13  CBC:  Recent Labs Lab 07/20/13 1455 07/21/13 0427  WBC 14.4* 14.7*  NEUTROABS 13.0*  --   HGB 13.3 12.0*  HCT 40.0 35.9*  MCV 87.5 88.6  PLT 210 163   CBG:  Recent Labs Lab 07/20/13 1750 07/20/13 2107 07/21/13 0800 07/21/13 1148  GLUCAP 197* 244* 177* 182*   Coagulation:  Recent Labs Lab 07/20/13 1642 07/21/13 0427  LABPROT 24.9* 22.4*  INR 2.34* 2.04*   Urinalysis:  Recent Labs Lab 07/20/13 1409  COLORURINE YELLOW  LABSPEC 1.013  PHURINE 5.0  GLUCOSEU NEGATIVE  HGBUR LARGE*  BILIRUBINUR NEGATIVE  KETONESUR NEGATIVE  PROTEINUR 100*  UROBILINOGEN 0.2  NITRITE NEGATIVE  LEUKOCYTESUR LARGE*   Misc. Labs: Sodium, Ur  Date Value Range Status  07/21/2013 17   Final   Creatinine, Urine  Date  Value Range Status  07/21/2013 219.69   Final   FENA = 0.13%  Micro Results: Recent Results (from the past 240 hour(s))  MRSA PCR SCREENING     Status: Abnormal   Collection Time    07/20/13  8:31 PM      Result Value Range Status   MRSA by PCR POSITIVE (*) NEGATIVE Final   Comment:            The GeneXpert MRSA Assay (FDA     approved for NASAL specimens     only), is one component of a     comprehensive MRSA colonization     surveillance program. It is not     intended to diagnose MRSA     infection nor to guide or     monitor treatment for     MRSA infections.     RESULT CALLED TO, READ BACK BY AND VERIFIED WITH:     A [facility] RN 2212 07/20/13 A BROWNING   Medications: I  have reviewed the patient's current medications. Scheduled Meds: . amiodarone  200 mg Oral Daily  . carvedilol  12.5 mg Oral BID WC  . cefTRIAXone (ROCEPHIN)  IV  1 g Intravenous Q24H  . Chlorhexidine Gluconate Cloth  6 each Topical Q0600  . clopidogrel  75 mg Oral Daily  . insulin aspart protamine- aspart  10 Units Subcutaneous BID WC  . mupirocin ointment  1 application Nasal BID  . warfarin  2.5 mg Oral Q48H  . [START ON 07/22/2013] warfarin  5 mg Oral Q48H  . Warfarin - Pharmacist Dosing Inpatient   Does not apply q1800   Continuous Infusions:  PRN Meds:.HYDROcodone-acetaminophen, ondansetron (ZOFRAN) IV, ondansetron  Assessment/Plan: Mr. [name] is a 72 y.o. man with a history of NSTEMI s/p PTCA/DES on 03/2013, paroxysmal atrial fibrillation, systolic heart failure, type 2 diabetes mellitus, and stage 3 chronic kidney disease who presents with complaints of urinary frequency, dysuria, right-sided flank pain, and malodorous urine.   #UTI - Patient's history and lab work suggest cystitis. His urinalysis is remarkable for large leukesterase, too numerous to count WBCs and RBCs, few squamous cells. On admission he did have a white blood count of 14.4 and was febrile to 102.4 in the emergency department, this has normalized (though the patient did receive Tylenol). His WBC is stable at 14.7 today. Anion gap 14 > 13. Pyelonephritis is possible as he also had nausea at home and presented with complaints of right flank pain, however he has no CVA tenderness on exam and no nausea or vomiting presently.  - Continue IV ceftriaxone to cover cystitis and pyelonephritis - Norco prn pain  - Zofran prn nausea  - Followup a.m. CBC   - Follow up blood, urine cultures  - Monitor vital signs   #Acute on chronic kidney injury - This morning his creatinine increased to 2.16 from 1.67 on admission (baseline 1.3). Could be prerenal secondary to decreased by mouth intake while continuing to take his home  torsemide vs. Postrenal 2/2 obstruction, BPH. FENA = 0.13% suggesting prerenal etiology vs. Early postrenal. - Ordered renal US  - Planned to bladder scan and subsequently straight cath for retention volume greater than 100 ml. However, the patient spontaneously voided 250 mL later this AM and residual volume was measured at 13 mL, thus no in and out cath was needed. - Holding diuretics for now, patient is below baseline weight  - Followup a.m. BMP   #DM2 - Last A1C  7.3 on 06/14/13. Takes Humolog 75/25 15-25 units 2-3 times per day and linagliptin 5 mg daily at home.  - Monitor CBGs 4 times daily; have been 177, 182 - Will give novolog 70/30 10 units BID while admitted  - Holding home linagliptin   #Paroxysmal atrial fibrillation - Patient is currently in sinus rhythm. He is anticoagulated at home on warfarin, alternating 2.5 and 5 mg daily. INR is therapeutic at 2.34  - Will continue on amiodarone  - Coumadin dosing per pharmacy   #HTN - 140-150s/60-70s currently.  - Continue home Coreg  - Holding home hydralazine, imdur for now given several soft pressures   #History of NSTEMI s/p PTCA/DES - No chest pain.  - Continue home Plavix   #CHF - EF of 35-40%. Weight 232 today, reports baseline around 140. No clinical signs of volume overload on exam. Patient has bilateral ventricular ICD placed.  - Monitor daily weights  - Holding diuretics in the setting of acute on chronic kidney injury  - Heart healthy diet   #DVT PPX - home Coumadin   Dispo: Disposition is deferred at this time, awaiting improvement of current medical problems.  Anticipated discharge in approximately 1-3 day(s).   The patient does have a current PCP [name], MD) and does need an [facility] hospital follow-up appointment after discharge.  The patient does not have transportation limitations that hinder transportation to clinic appointments.  .Services Needed at time of discharge: Y = Yes, Blank = No PT:   OT:   RN:    Equipment:   Other:     LOS: 1 day   Vivi Barrack, MD 07/21/2013, 1:05 PM

## 2013-07-21 NOTE — Care Management Note (Unsigned)
    Page 1 of 1   07/21/2013     8:48:41 AM   CARE MANAGEMENT NOTE 07/21/2013  Patient:  [name], [name]   Account Number:  [id number]  Date Initiated:  07/21/2013  Documentation initiated by:  Letha Cape  Subjective/Objective Assessment:   dx uti, arf  admit as observation- lives with spouse.     Action/Plan:   Anticipated DC Date:  07/23/2013   Anticipated DC Plan:  HOME W HOME HEALTH SERVICES      DC Planning Services  CM consult      Choice offered to / List presented to:             Status of service:  In process, will continue to follow Medicare Important Message given?   (If response is "NO", the following Medicare IM given date fields will be blank) Date Medicare IM given:   Date Additional Medicare IM given:    Discharge Disposition:    Per UR Regulation:  Reviewed for med. necessity/level of care/duration of stay  If discussed at Long Length of Stay Meetings, dates discussed:    Comments:  07/21/13 8:47 Letha Cape RN, BSN [phone] patient lives with spouse,  NCM will continue to follow for dc needs.

## 2013-07-21 NOTE — Progress Notes (Signed)
ANTICOAGULATION CONSULT NOTE - Follow Up Consult  Pharmacy Consult for Coumadin Indication: atrial fibrillation  Allergies  Allergen Reactions  . Codeine     REACTION: blisters, but able to take hydrocodone    Patient Measurements: Height: 5\' 8"  (172.7 cm) Weight: 232 lb 6.4 oz (105.416 kg) IBW/kg (Calculated) : 68.4  Vital Signs: Temp: 98.5 F (36.9 C) (09/05 0642) Temp src: Oral (09/05 0642) BP: 147/79 mmHg (09/05 0642) Pulse Rate: 67 (09/05 0642)  Labs:  Recent Labs  07/20/13 1455 07/20/13 1642 07/21/13 0427  HGB 13.3  --  12.0*  HCT 40.0  --  35.9*  PLT 210  --  163  LABPROT  --  24.9* 22.4*  INR  --  2.34* 2.04*  CREATININE 1.67*  --  2.16*    Estimated Creatinine Clearance: 35.8 ml/min (by C-G formula based on Cr of 2.16).   Medications:  Scheduled:  . amiodarone  200 mg Oral Daily  . carvedilol  12.5 mg Oral BID WC  . cefTRIAXone (ROCEPHIN)  IV  1 g Intravenous Q24H  . Chlorhexidine Gluconate Cloth  6 each Topical Q0600  . clopidogrel  75 mg Oral Daily  . insulin aspart protamine- aspart  10 Units Subcutaneous BID WC  . mupirocin ointment  1 application Nasal BID  . warfarin  2.5 mg Oral Q48H  . [START ON 07/22/2013] warfarin  5 mg Oral Q48H  . Warfarin - Pharmacist Dosing Inpatient   Does not apply q1800    Assessment: 72 yo M presented 9/4 with UTI/cystitis symptoms and started on Rocephin.  Cx pending.  Pt on Coumadin PTA for hx afib.  INR therapeutic.  Goal of Therapy:  INR 2-3   Plan:  Continue home regimen of Coumadin 2.5mg  alternating with 5mg  daily.  Dose due today is 2.5mg  dose.  Continue daily INR for now.  [employer], Pharm.D., BCPS Clinical Pharmacist Pager [phone] 07/21/2013 10:12 AM

## 2013-07-22 DIAGNOSIS — I2589 Other forms of chronic ischemic heart disease: Secondary | ICD-10-CM

## 2013-07-22 LAB — GLUCOSE, CAPILLARY
Glucose-Capillary: 210 mg/dL — ABNORMAL HIGH (ref 70–99)
Glucose-Capillary: 221 mg/dL — ABNORMAL HIGH (ref 70–99)

## 2013-07-22 LAB — BASIC METABOLIC PANEL
BUN: 38 mg/dL — ABNORMAL HIGH (ref 6–23)
Calcium: 8.8 mg/dL (ref 8.4–10.5)
GFR calc Af Amer: 43 mL/min — ABNORMAL LOW (ref >90)
GFR calc non Af Amer: 37 mL/min — ABNORMAL LOW (ref >90)
Glucose, Bld: 157 mg/dL — ABNORMAL HIGH (ref 70–99)
Potassium: 4.3 mEq/L (ref 3.5–5.1)
Sodium: 130 mEq/L — ABNORMAL LOW (ref 135–145)

## 2013-07-22 LAB — PROTIME-INR: INR: 1.58 — ABNORMAL HIGH (ref 0.00–1.49)

## 2013-07-22 MED ORDER — SODIUM CHLORIDE 0.9 % IV BOLUS (SEPSIS)
500.0000 mL | Freq: Once | INTRAVENOUS | Status: AC
  Administered 2013-07-22: 16:00:00 500 mL via INTRAVENOUS

## 2013-07-22 NOTE — Progress Notes (Signed)
Internal Medicine Attending  Date: 07/22/2013  Patient name: [name] Medical record number: [id number] Date of birth: 1941/03/12 Age: 72 y.o. Gender: male  I saw and evaluated the patient. I reviewed the resident's note by Dr. Dorise Hiss and I agree with the resident's findings and plans as documented in her progress note.  FeNa suggested a pre-renal azotemia.  Creatinine has improved with gentle hydration.  Will give another 500 cc bolus today and recheck creatinine in AM.  I suspect it will be closer to baseline and he should be ready for discharge home.  At discharge will change torsemide to 5 mg by mouth every other day in hopes of maintaining euvolemia.

## 2013-07-22 NOTE — Progress Notes (Signed)
ANTICOAGULATION CONSULT NOTE - Follow Up Consult  Pharmacy Consult for coumadin Indication: atrial fibrillation  Allergies  Allergen Reactions  . Codeine     REACTION: blisters, but able to take hydrocodone    Patient Measurements: Height: 5\' 8"  (172.7 cm) Weight: 232 lb 2.3 oz (105.3 kg) IBW/kg (Calculated) : 68.4   Vital Signs: Temp: 98 F (36.7 C) (09/06 0537) Temp src: Oral (09/06 0537) BP: 153/72 mmHg (09/06 0637) Pulse Rate: 61 (09/06 0537)  Labs:  Recent Labs  07/20/13 1455 07/20/13 1642 07/21/13 0427 07/21/13 1906  HGB 13.3  --  12.0*  --   HCT 40.0  --  35.9*  --   PLT 210  --  163  --   LABPROT  --  24.9* 22.4*  --   INR  --  2.34* 2.04*  --   CREATININE 1.67*  --  2.16* 2.05*    Estimated Creatinine Clearance: 37.8 ml/min (by C-G formula based on Cr of 2.05).  Assessment: Patient is a 72 y.o M on coumadin for afib. He's currently on home regimen of 5mg  alternating with 2.5mg . INR decreased from 2.04 to 1.58 today with 2.5mg  dose given last night.  No bleeding documented.   Goal of Therapy:  INR 2-3  Plan:  1) since patient will get the higher dose of 5mg  today (per home regimen), will continue with that for now.  Pharmacy will plan on increasing dose if INR cont to trend down tomorrow.  Aiysha Jillson P 07/22/2013,7:58 AM

## 2013-07-22 NOTE — Progress Notes (Signed)
Subjective: The patient is doing well this morning and is not having any more back pain. He is not having the burning when he urinates and feels he is making more urine. He is not having any nausea or vomiting and was able to eat yesterday and today. He is not having any chest pain, back pain, SOB, nausea, vomiting, diarrhea. He has no other complaints. He would be comfortable staying another day while his kidneys recover from the decreased fluid.   Objective: Vital signs in last 24 hours: Filed Vitals:   07/21/13 2206 07/22/13 0537 07/22/13 0637 07/22/13 1727  BP:  163/71 153/72 151/73  Pulse:  61  61  Temp: 100.2 F (37.9 C) 98 F (36.7 C)    TempSrc:  Oral    Resp:      Height:      Weight:  232 lb 2.3 oz (105.3 kg)    SpO2:  94%     Weight change: 0 lb (0 kg)  Intake/Output Summary (Last 24 hours) at 07/22/13 1833 Last data filed at 07/22/13 1727  Gross per 24 hour  Intake    240 ml  Output   1400 ml  Net  -1160 ml   General: sitting up eating breakfast when seen, pleasant HEENT: PERRL, EOMI, no scleral icterus Cardiac: S1 S2 heard, no murmur Pulm: clear to auscultation bilaterally, moving normal volumes of air, no rales or crackles Abd: soft, nontender, nondistended, BS present, obese, no flank tenderness or suprapubic tenderness Ext: warm and well perfused, no pedal edema Neuro: alert and oriented X3, cranial nerves II-XII grossly intact  Lab Results: Basic Metabolic Panel:  Recent Labs Lab 07/21/13 1906 07/22/13 1100  NA 128* 130*  K 4.5 4.3  CL 92* 95*  CO2 24 25  GLUCOSE 216* 157*  BUN 42* 38*  CREATININE 2.05* 1.74*  CALCIUM 8.4 8.8   CBC:  Recent Labs Lab 07/20/13 1455 07/21/13 0427  WBC 14.4* 14.7*  NEUTROABS 13.0*  --   HGB 13.3 12.0*  HCT 40.0 35.9*  MCV 87.5 88.6  PLT 210 163   CBG:  Recent Labs Lab 07/21/13 1148 07/21/13 1647 07/21/13 2127 07/22/13 0818 07/22/13 1210 07/22/13 1726  GLUCAP 182* 194* 162* 151* 135* 210*    Coagulation:  Recent Labs Lab 07/20/13 1642 07/21/13 0427 07/22/13 0500  LABPROT 24.9* 22.4* 18.4*  INR 2.34* 2.04* 1.58*   Urinalysis:  Recent Labs Lab 07/20/13 1409  COLORURINE YELLOW  LABSPEC 1.013  PHURINE 5.0  GLUCOSEU NEGATIVE  HGBUR LARGE*  BILIRUBINUR NEGATIVE  KETONESUR NEGATIVE  PROTEINUR 100*  UROBILINOGEN 0.2  NITRITE NEGATIVE  LEUKOCYTESUR LARGE*   Micro Results: Recent Results (from the past 240 hour(s))  URINE CULTURE     Status: None   Collection Time    07/20/13  2:09 PM      Result Value Range Status   Specimen Description URINE, CLEAN CATCH   Final   Special Requests NONE   Final   Culture  Setup Time     Final   Value: 07/21/2013 01:14     Performed at [facility] Count     Final   Value: 8,000 COLONIES/ML     Performed at [facility]   Culture     Final   Value: INSIGNIFICANT GROWTH     Performed at [facility]   Report Status 07/21/2013 FINAL   Final  MRSA PCR SCREENING     Status: Abnormal  Collection Time    07/20/13  8:31 PM      Result Value Range Status   MRSA by PCR POSITIVE (*) NEGATIVE Final   Comment:            The GeneXpert MRSA Assay (FDA     approved for NASAL specimens     only), is one component of a     comprehensive MRSA colonization     surveillance program. It is not     intended to diagnose MRSA     infection nor to guide or     monitor treatment for     MRSA infections.     RESULT CALLED TO, READ BACK BY AND VERIFIED WITH:     A MOHAMMED RN [id number] 07/20/13 A BROWNING  CULTURE, BLOOD (ROUTINE X 2)     Status: None   Collection Time    07/20/13 10:35 PM      Result Value Range Status   Specimen Description BLOOD RIGHT HAND   Final   Special Requests BOTTLES DRAWN AEROBIC AND ANAEROBIC 10CC EACH   Final   Culture  Setup Time     Final   Value: 07/21/2013 11:40     Performed at [facility]   Culture     Final   Value:        BLOOD CULTURE RECEIVED NO GROWTH TO  DATE CULTURE WILL BE HELD FOR 5 DAYS BEFORE ISSUING A FINAL NEGATIVE REPORT     Performed at [facility]   Report Status PENDING   Incomplete  CULTURE, BLOOD (ROUTINE X 2)     Status: None   Collection Time    07/20/13 10:45 PM      Result Value Range Status   Specimen Description BLOOD LEFT HAND   Final   Special Requests BOTTLES DRAWN AEROBIC AND ANAEROBIC 10CC EACH   Final   Culture  Setup Time     Final   Value: 07/21/2013 11:40     Performed at [facility]   Culture     Final   Value:        BLOOD CULTURE RECEIVED NO GROWTH TO DATE CULTURE WILL BE HELD FOR 5 DAYS BEFORE ISSUING A FINAL NEGATIVE REPORT     Performed at [facility]   Report Status PENDING   Incomplete   Studies/Results: US Renal  07/22/2013   *RADIOLOGY REPORT*  Clinical Data: Elevated creatinine.  Oliguria.  RENAL/URINARY TRACT ULTRASOUND COMPLETE  Comparison:  CT abdomen and pelvis 06/13/2013.  Findings:  Right Kidney:  Right kidney measures 12 cm length.  Mild diffuse parenchymal thinning.  Increased parenchymal echotexture.  Changes consistent with chronic medical renal disease.  No hydronephrosis.  Left Kidney:  Left kidney measures 13.7 cm length.  Diffuse parenchymal thinning.  Increased parenchymal echotexture.  Changes consistent with chronic medical renal disease.  No hydronephrosis. Small cyst in the lower pole measuring about 1.3 cm diameter.  Bladder:  Bladder volume measures 261 ml.  No filling defect or bladder wall thickening.  Postvoid image is not obtained.  IMPRESSION: Renal parenchymal changes bilaterally consistent with chronic medical renal disease.  Small cyst in the left kidney.  No hydronephrosis.  Normal bladder.   Original Report Authenticated By: Burman Nieves, M.D.   Medications: I have reviewed the patient's current medications. Scheduled Meds: . amiodarone  200 mg Oral Daily  . carvedilol  12.5 mg Oral BID WC  . cefTRIAXone (ROCEPHIN)  IV  1  g Intravenous Q24H  .  Chlorhexidine Gluconate Cloth  6 each Topical Q0600  . clopidogrel  75 mg Oral Daily  . insulin aspart protamine- aspart  10 Units Subcutaneous BID WC  . mupirocin ointment  1 application Nasal BID  . warfarin  2.5 mg Oral Q48H  . warfarin  5 mg Oral Q48H  . Warfarin - Pharmacist Dosing Inpatient   Does not apply q1800   Continuous Infusions:  PRN Meds:.HYDROcodone-acetaminophen, ondansetron (ZOFRAN) IV, ondansetron Assessment/Plan: UTI (lower urinary tract infection) - Urine culture with insignificant growth although flank pain and associated cyst could represent cyst inflammation and will continue antibiotics with IV ceftriaxone today and plan to switch to oral ciprofloxacin tomorrow (with good cyst penetration). He is symptomatically improving. -IV ceftriaxone today and switch to PO cipro tomorrow -Monitor BMP -Renal US normal without obstruction  Acute on CKD (chronic kidney disease) stage 3, GFR 30-59 ml/min - Cr 1.7 today better from 2.1 yesterday with fluid administration. Renal US without obstructive cause. His FeNa was 0.1% however in the setting of home diuretic usage can be skewed although normally will elevate the value so almost certainly true pre-renal etiology. -As volume status can tolerate will give another 500 cc bolus NS today -BMP in AM and likely D/C tomorrow if Cr improving. DM  Essential hypertension, benign - Continue coreg, hold lasix for now.  SYSTOLIC HEART FAILURE, CHRONIC - Fluid status seems to be euvolemic today and will give some more fluids for pre-renal kidney injury. Continue amiodarone, warfarin, coreg.   Paroxysmal atrial fibrillation - In sinus rhymthm at today's exam and continue amiodarone for control with warfarin for anti-coagulation.  -Warfarin per pharmacy  DVT ppx - warfarin  Dispo: Disposition is deferred at this time, awaiting improvement of current medical problems.  Anticipated discharge in approximately 1 day(s).   The patient does  have a current PCP [name], MD) and does not need an [facility] hospital follow-up appointment after discharge.  The patient does not have transportation limitations that hinder transportation to clinic appointments.  .Services Needed at time of discharge: Y = Yes, Blank = No PT:   OT:   RN:   Equipment:   Other:     LOS: 2 days   Judie Bonus, MD 07/22/2013, 6:33 PM

## 2013-07-23 LAB — BASIC METABOLIC PANEL
Chloride: 96 mEq/L (ref 96–112)
GFR calc Af Amer: 51 mL/min — ABNORMAL LOW (ref >90)
Potassium: 4.3 mEq/L (ref 3.5–5.1)

## 2013-07-23 LAB — PROTIME-INR: INR: 1.77 — ABNORMAL HIGH (ref 0.00–1.49)

## 2013-07-23 LAB — GLUCOSE, CAPILLARY
Glucose-Capillary: 177 mg/dL — ABNORMAL HIGH (ref 70–99)
Glucose-Capillary: 223 mg/dL — ABNORMAL HIGH (ref 70–99)

## 2013-07-23 MED ORDER — CIPROFLOXACIN HCL 500 MG PO TABS: 500.0000 mg | ORAL_TABLET | Freq: Two times a day (BID) | ORAL | Status: DC

## 2013-07-23 MED ORDER — WARFARIN SODIUM 5 MG PO TABS
5.0000 mg | ORAL_TABLET | Freq: Once | ORAL | Status: DC
  Filled 2013-07-23: qty 1

## 2013-07-23 NOTE — Discharge Summary (Signed)
Name: [name] MRN: [id number] DOB: 05-06-1941 72 y.o. PCP: [name], MD  Date of Admission: 07/20/2013  3:37 PM Date of Discharge: 07/23/2013 Attending Physician: Rocco Serene, MD  Discharge Diagnosis: Principal Problem:   UTI (lower urinary tract infection) Active Problems:   DM   Essential hypertension, benign   SYSTOLIC HEART FAILURE, CHRONIC   Paroxysmal atrial fibrillation   CKD (chronic kidney disease) stage 3, GFR 30-59 ml/min  Discharge Medications:   Medication List    STOP taking these medications       acetaminophen 650 MG CR tablet  Commonly known as:  TYLENOL      TAKE these medications       amiodarone 400 MG tablet  Commonly known as:  PACERONE  Take 200 mg by mouth daily.     carvedilol 12.5 MG tablet  Commonly known as:  COREG  Take 12.5 mg by mouth 2 (two) times daily with a meal.     ciprofloxacin 500 MG tablet  Commonly known as:  CIPRO  Take 1 tablet (500 mg total) by mouth 2 (two) times daily.     clopidogrel 75 MG tablet  Commonly known as:  PLAVIX  Take 75 mg by mouth daily.     ferrous sulfate 325 (65 FE) MG tablet  Take 325 mg by mouth every evening.     hydrALAZINE 25 MG tablet  Commonly known as:  APRESOLINE  Take 25 mg by mouth 2 (two) times daily.     HYDROcodone-acetaminophen 10-325 MG per tablet  Commonly known as:  NORCO  Take 1 tablet by mouth every 8 (eight) hours as needed for pain.     insulin lispro protamine-lispro (75-25) 100 UNIT/ML Susp injection  Commonly known as:  HUMALOG 75/25  Inject 15-25 Units into the skin 3 (three) times daily after meals. Will inject between 15 and 25 units each time blood sugar level is checked.     isosorbide mononitrate 30 MG 24 hr tablet  Commonly known as:  IMDUR  Take 30 mg by mouth daily.     l-methylfolate-B6-B12 3-35-2 MG Tabs  Commonly known as:  METANX  Take 1 tablet by mouth daily.     linagliptin 5 MG Tabs tablet  Commonly known as:  TRADJENTA  Take 5 mg  by mouth daily.     nitroGLYCERIN 0.4 MG SL tablet  Commonly known as:  NITROSTAT  Place 0.4 mg under the tongue every 5 (five) minutes as needed for chest pain. For chest pain     potassium chloride 10 MEQ tablet  Commonly known as:  K-DUR  Take 10 mEq by mouth daily.     torsemide 10 MG tablet  Commonly known as:  DEMADEX  Take 5 mg by mouth every evening.     warfarin 5 MG tablet  Commonly known as:  COUMADIN  Take 2.5-5 mg by mouth See admin instructions. Take 5mg  for two days then 2.5mg .... Repeat regimen     Zinc 50 MG Caps  Take 50 mg by mouth daily.        Disposition and follow-up:   Mr.Arthuro R Buenger was discharged from [facility] in Stable condition.  At the hospital follow up visit please address:  1.  Kidney function   2.  Labs / imaging needed at time of follow-up: possibly BMP  3.  Pending labs/ test needing follow-up: none  Follow-up Appointments:     Follow-up Information   Follow up  with [facility], MD. (Please see your doctor in 1-2 weeks.)    Specialty:  Internal Medicine   Contact information:   [address]  [place] [address] [phone]       Discharge Instructions: Discharge Orders   Future Appointments Provider Department Dept Phone   08/28/2013 9:00 AM Hillis Range, MD [address] (near [place]) [phone]   10/18/2013 8:20 AM Jonelle Sidle, MD [facility] (near [place]) [phone]   Future Orders Complete By Expires   Call MD for:  persistant nausea and vomiting  As directed    Call MD for:  severe uncontrolled pain  As directed    Call MD for:  temperature >100.4  As directed    Diet - low sodium heart healthy  As directed    Increase activity slowly  As directed       Consultations:    Procedures Performed:  US Renal  07/22/2013   *RADIOLOGY REPORT*  Clinical Data: Elevated creatinine.  Oliguria.  RENAL/URINARY TRACT ULTRASOUND COMPLETE  Comparison:  CT abdomen and pelvis  06/13/2013.  Findings:  Right Kidney:  Right kidney measures 12 cm length.  Mild diffuse parenchymal thinning.  Increased parenchymal echotexture.  Changes consistent with chronic medical renal disease.  No hydronephrosis.  Left Kidney:  Left kidney measures 13.7 cm length.  Diffuse parenchymal thinning.  Increased parenchymal echotexture.  Changes consistent with chronic medical renal disease.  No hydronephrosis. Small cyst in the lower pole measuring about 1.3 cm diameter.  Bladder:  Bladder volume measures 261 ml.  No filling defect or bladder wall thickening.  Postvoid image is not obtained.  IMPRESSION: Renal parenchymal changes bilaterally consistent with chronic medical renal disease.  Small cyst in the left kidney.  No hydronephrosis.  Normal bladder.   Original Report Authenticated By: Burman Nieves, M.D.   Admission HPI:  [name] is a 72 y.o. man with a history of NSTEMI s/p PTCA/DES on 03/2013, paroxysmal atrial fibrillation, systolic heart failure, type 2 diabetes mellitus, and stage 3 chronic kidney disease who presents with complaints of urinary frequency, dysuria, right-sided flank pain, and malodorous urine.  His symptoms began one day ago. At home he endorses having subjective fevers, chills, and nausea without vomiting. He notes to [place] that his symptoms feel similar to those that led him to the hospital about 6 weeks ago, when he was admitted for SIRS 2/2 UTI for 4 days. He saw his primary care physician emergently yesterday for these complaints, and received a prescription for an unknown antibiotic. However, apparently his pharmacy refused to fill the medication because it interacted with his amiodarone. He denies chest pain, shortness of breath, orthopnea, PND, leg swelling. No present nausea, vomiting, diarrhea, abdominal pain. He does endorse some decreased food and drink intake over the past day secondary to nausea and his other symptoms. He has continued to take his medications  as prescribed, including torsemide 5 mg daily. He weighs himself daily, his weight is stable at 240 pounds. He exercises regularly, including country-style dancing with his wife twice a week. He follows closely with his cardiologist, Dr. Diona Browner. Last office visit was 07/07/13. Echocardiogram from May of this year demonstrated moderate LVH with LVEF 35-40%, grade 1 diastolic dysfunction, mildly dilated left atrium. He reports good control of his diabetes on Humalog 75/25 with a sliding scale of 15-25 units 2-3 times per day. Self reports his last hemoglobin A1c was 6.4, but records indicate it was 7.3 on 06/14/13.  Hospital  Course by problem list:  UTI (lower urinary tract infection) - The patient did have clinical signs of UTI with some changes of his urinalysis indicative of UTI. He did have renal US given his AKI and concern for hydronephrosis although that was negative. There was initial concern for obstuction with BPH but he denied any symptoms of BPH the following day. He was given ceftriaxone in the hospital and was discharged on oral ciprofloxacin for an additional 4 days on discharge. He did recover well and had resolution of his flank pain.   Acute kidney injury - The patient did have acute kidney injury with elevation of creatinine from his baseline around 1.5 to 2.1 during this admission which was likely pre-renal in etiology given his low FeNa (<.1%), he had poor PO intake in days prior to admission without cessation of his diuretics. He was given fluid repletion with improvement of his creatinine during this stay. He was advised on D/C to not take his diuretic pill if he is not eating or drinking or if he is throwing up or having diarrhea.   DM - Blood sugars controlled during his hospitalization with his home dosing of novolog 70/30 15 units BID.   SYSTOLIC HEART FAILURE, CHRONIC - The patient's diuretics were held during this admission and he was advised to resume on discharge given his good  PO intake and resolution of his AKI  Paroxysmal atrial fibrillation - In sinus rthymn during this admission and continued with amiodarone, warfarin, beta-blocker.   CKD (chronic kidney disease) stage 3, GFR 30-59 ml/min - See above for full details but with episode of AKI during this admission.   Discharge Vitals:   BP 174/76  Pulse 60  Temp(Src) 98.7 F (37.1 C) (Oral)  Resp 18  Ht 5\' 8"  (1.727 m)  Wt 241 lb 3.2 oz (109.408 kg)  BMI 36.68 kg/m2  SpO2 96%  Discharge Labs:  Results for orders placed during the hospital encounter of 07/20/13 (from the past 24 hour(s))  GLUCOSE, CAPILLARY     Status: Abnormal   Collection Time    07/22/13 12:10 PM      Result Value Range   Glucose-Capillary 135 (*) 70 - 99 mg/dL  GLUCOSE, CAPILLARY     Status: Abnormal   Collection Time    07/22/13  5:26 PM      Result Value Range   Glucose-Capillary 210 (*) 70 - 99 mg/dL  GLUCOSE, CAPILLARY     Status: Abnormal   Collection Time    07/22/13  9:58 PM      Result Value Range   Glucose-Capillary 221 (*) 70 - 99 mg/dL   Comment 1 Notify RN    PROTIME-INR     Status: Abnormal   Collection Time    07/23/13  5:55 AM      Result Value Range   Prothrombin Time 20.1 (*) 11.6 - 15.2 seconds   INR 1.77 (*) 0.00 - 1.49  BASIC METABOLIC PANEL     Status: Abnormal   Collection Time    07/23/13  5:55 AM      Result Value Range   Sodium 131 (*) 135 - 145 mEq/L   Potassium 4.3  3.5 - 5.1 mEq/L   Chloride 96  96 - 112 mEq/L   CO2 25  19 - 32 mEq/L   Glucose, Bld 181 (*) 70 - 99 mg/dL   BUN 33 (*) 6 - 23 mg/dL   Creatinine, Ser 1.61 (*) 0.50 - 1.35 mg/dL  Calcium 9.1  8.4 - 10.5 mg/dL   GFR calc non Af Amer 44 (*) >90 mL/min   GFR calc Af Amer 51 (*) >90 mL/min  GLUCOSE, CAPILLARY     Status: Abnormal   Collection Time    07/23/13  7:44 AM      Result Value Range   Glucose-Capillary 177 (*) 70 - 99 mg/dL    Signed: Judie Bonus, MD 07/23/2013, 11:30 AM   Time Spent on Discharge: 28  minutes Services Ordered on Discharge:  none Equipment Ordered on Discharge: none

## 2013-07-23 NOTE — Progress Notes (Signed)
ANTICOAGULATION CONSULT NOTE - Follow Up Consult  Pharmacy Consult for coumadin Indication: atrial fibrillation  Allergies  Allergen Reactions  . Codeine     REACTION: blisters, but able to take hydrocodone    Patient Measurements: Height: 5\' 8"  (172.7 cm) Weight: 241 lb 3.2 oz (109.408 kg) IBW/kg (Calculated) : 68.4   Vital Signs: Temp: 98.7 F (37.1 C) (09/07 0529) Temp src: Oral (09/07 0529) BP: 174/76 mmHg (09/07 0529) Pulse Rate: 60 (09/07 0529)  Labs:  Recent Labs  07/20/13 1455  07/21/13 0427 07/21/13 1906 07/22/13 0500 07/22/13 1100 07/23/13 0555  HGB 13.3  --  12.0*  --   --   --   --   HCT 40.0  --  35.9*  --   --   --   --   PLT 210  --  163  --   --   --   --   LABPROT  --   < > 22.4*  --  18.4*  --  20.1*  INR  --   < > 2.04*  --  1.58*  --  1.77*  CREATININE 1.67*  --  2.16* 2.05*  --  1.74* 1.52*  < > = values in this interval not displayed.  Estimated Creatinine Clearance: 51.9 ml/min (by C-G formula based on Cr of 1.52).  Assessment: Patient's a 72y.o M on coumadin for afib.  INR is subtherapeutic but increased from 1.58 to 1.77 today.  Pt's wife stated that his home regimen is actually 5mg , 5mg , 2.5mg ... repeat.  Med rec has been updated to this regimen.  Goal of Therapy:  INR 2-3    Plan:  1) coumadin 5mg  PO x1 today  Keno Caraway P 07/23/2013,7:57 AM

## 2013-07-23 NOTE — Progress Notes (Signed)
Subjective: The patient is doing well this morning and is having some minimal back pain from lying in bed too much. He states he is very active when he is at home. He is not having the burning when he urinates and feels he is making more urine. He is not having any nausea or vomiting and was able to eat yesterday and today. He is not having any chest pain, back pain, SOB, nausea, vomiting, diarrhea. He has no other complaints.   Objective: Vital signs in last 24 hours: Filed Vitals:   07/22/13 0637 07/22/13 1727 07/22/13 2159 07/23/13 0529  BP: 153/72 151/73 145/68 174/76  Pulse:  61 56 60  Temp:   98.5 F (36.9 C) 98.7 F (37.1 C)  TempSrc:   Oral Oral  Resp:   18 18  Height:      Weight:    241 lb 3.2 oz (109.408 kg)  SpO2:   93% 96%   Weight change: 9 lb 0.9 oz (4.108 kg)  Intake/Output Summary (Last 24 hours) at 07/23/13 1131 Last data filed at 07/23/13 0700  Gross per 24 hour  Intake    480 ml  Output   2200 ml  Net  -1720 ml   General: sitting up in chair when seen, pleasant HEENT: PERRL, EOMI, no scleral icterus Cardiac: S1 S2 heard, no murmur Pulm: clear to auscultation bilaterally, moving normal volumes of air, no rales or crackles Abd: soft, nontender, nondistended, BS present, obese, no flank tenderness or suprapubic tenderness Ext: warm and well perfused, no pedal edema Neuro: alert and oriented X3, cranial nerves II-XII grossly intact  Lab Results: Basic Metabolic Panel:  Recent Labs Lab 07/22/13 1100 07/23/13 0555  NA 130* 131*  K 4.3 4.3  CL 95* 96  CO2 25 25  GLUCOSE 157* 181*  BUN 38* 33*  CREATININE 1.74* 1.52*  CALCIUM 8.8 9.1   CBC:  Recent Labs Lab 07/20/13 1455 07/21/13 0427  WBC 14.4* 14.7*  NEUTROABS 13.0*  --   HGB 13.3 12.0*  HCT 40.0 35.9*  MCV 87.5 88.6  PLT 210 163   CBG:  Recent Labs Lab 07/21/13 2127 07/22/13 0818 07/22/13 1210 07/22/13 1726 07/22/13 2158 07/23/13 0744  GLUCAP 162* 151* 135* 210* 221* 177*    Coagulation:  Recent Labs Lab 07/20/13 1642 07/21/13 0427 07/22/13 0500 07/23/13 0555  LABPROT 24.9* 22.4* 18.4* 20.1*  INR 2.34* 2.04* 1.58* 1.77*   Urinalysis:  Recent Labs Lab 07/20/13 1409  COLORURINE YELLOW  LABSPEC 1.013  PHURINE 5.0  GLUCOSEU NEGATIVE  HGBUR LARGE*  BILIRUBINUR NEGATIVE  KETONESUR NEGATIVE  PROTEINUR 100*  UROBILINOGEN 0.2  NITRITE NEGATIVE  LEUKOCYTESUR LARGE*   Micro Results: Recent Results (from the past 240 hour(s))  URINE CULTURE     Status: None   Collection Time    07/20/13  2:09 PM      Result Value Range Status   Specimen Description URINE, CLEAN CATCH   Final   Special Requests NONE   Final   Culture  Setup Time     Final   Value: 07/21/2013 01:14     Performed at [facility] Count     Final   Value: 8,000 COLONIES/ML     Performed at [facility]   Culture     Final   Value: INSIGNIFICANT GROWTH     Performed at [facility]   Report Status 07/21/2013 FINAL   Final  MRSA PCR SCREENING  Status: Abnormal   Collection Time    07/20/13  8:31 PM      Result Value Range Status   MRSA by PCR POSITIVE (*) NEGATIVE Final   Comment:            The GeneXpert MRSA Assay (FDA     approved for NASAL specimens     only), is one component of a     comprehensive MRSA colonization     surveillance program. It is not     intended to diagnose MRSA     infection nor to guide or     monitor treatment for     MRSA infections.     RESULT CALLED TO, READ BACK BY AND VERIFIED WITH:     A MOHAMMED RN [id number] 07/20/13 A BROWNING  CULTURE, BLOOD (ROUTINE X 2)     Status: None   Collection Time    07/20/13 10:35 PM      Result Value Range Status   Specimen Description BLOOD RIGHT HAND   Final   Special Requests BOTTLES DRAWN AEROBIC AND ANAEROBIC 10CC EACH   Final   Culture  Setup Time     Final   Value: 07/21/2013 11:40     Performed at [facility]   Culture     Final   Value:        BLOOD  CULTURE RECEIVED NO GROWTH TO DATE CULTURE WILL BE HELD FOR 5 DAYS BEFORE ISSUING A FINAL NEGATIVE REPORT     Performed at [facility]   Report Status PENDING   Incomplete  CULTURE, BLOOD (ROUTINE X 2)     Status: None   Collection Time    07/20/13 10:45 PM      Result Value Range Status   Specimen Description BLOOD LEFT HAND   Final   Special Requests BOTTLES DRAWN AEROBIC AND ANAEROBIC 10CC EACH   Final   Culture  Setup Time     Final   Value: 07/21/2013 11:40     Performed at [facility]   Culture     Final   Value:        BLOOD CULTURE RECEIVED NO GROWTH TO DATE CULTURE WILL BE HELD FOR 5 DAYS BEFORE ISSUING A FINAL NEGATIVE REPORT     Performed at [facility]   Report Status PENDING   Incomplete   Studies/Results: US Renal  07/22/2013   *RADIOLOGY REPORT*  Clinical Data: Elevated creatinine.  Oliguria.  RENAL/URINARY TRACT ULTRASOUND COMPLETE  Comparison:  CT abdomen and pelvis 06/13/2013.  Findings:  Right Kidney:  Right kidney measures 12 cm length.  Mild diffuse parenchymal thinning.  Increased parenchymal echotexture.  Changes consistent with chronic medical renal disease.  No hydronephrosis.  Left Kidney:  Left kidney measures 13.7 cm length.  Diffuse parenchymal thinning.  Increased parenchymal echotexture.  Changes consistent with chronic medical renal disease.  No hydronephrosis. Small cyst in the lower pole measuring about 1.3 cm diameter.  Bladder:  Bladder volume measures 261 ml.  No filling defect or bladder wall thickening.  Postvoid image is not obtained.  IMPRESSION: Renal parenchymal changes bilaterally consistent with chronic medical renal disease.  Small cyst in the left kidney.  No hydronephrosis.  Normal bladder.   Original Report Authenticated By: Burman Nieves, M.D.   Medications: I have reviewed the patient's current medications. Scheduled Meds: . amiodarone  200 mg Oral Daily  . carvedilol  12.5 mg Oral BID WC  . cefTRIAXone (ROCEPHIN)  IV  1 g Intravenous Q24H  . Chlorhexidine Gluconate Cloth  6 each Topical Q0600  . clopidogrel  75 mg Oral Daily  . insulin aspart protamine- aspart  10 Units Subcutaneous BID WC  . mupirocin ointment  1 application Nasal BID  . warfarin  5 mg Oral ONCE-1800  . Warfarin - Pharmacist Dosing Inpatient   Does not apply q1800   Continuous Infusions:  PRN Meds:.HYDROcodone-acetaminophen, ondansetron (ZOFRAN) IV, ondansetron Assessment/Plan: UTI (lower urinary tract infection) - Urine culture with insignificant growth although flank pain and associated cyst could represent cyst inflammation and will discharge with 5 days of ciprofloxacin.  -Renal US normal without obstruction -Cr improved almost to baseline -Home with oral cipro today  Acute on CKD (chronic kidney disease) stage 3, GFR 30-59 ml/min - Cr 1.5 today better from 2.1 Friday. Renal US without obstructive cause. His FeNa was 0.1% and almost certainly true pre-renal etiology.  DM - Patient was continued on his home novolog (conversion to 70/30 from humalog 75/25) and doing well without hypoglycemic episodes or hyperglycemic episodes.   Essential hypertension, benign - Continue coreg, hold lasix for now.  SYSTOLIC HEART FAILURE, CHRONIC - Fluid status seems to be euvolemic today. Continue amiodarone, warfarin, coreg.   Paroxysmal atrial fibrillation - In sinus rhymthm at today's exam and continue amiodarone for control with warfarin for anti-coagulation.  -Warfarin per pharmacy  DVT ppx - warfarin  Dispo: Disposition is deferred at this time, awaiting improvement of current medical problems.  Anticipated discharge in approximately 0 day(s).   The patient does have a current PCP [name], MD) and does not need an [facility] hospital follow-up appointment after discharge.  The patient does not have transportation limitations that hinder transportation to clinic appointments.  .Services Needed at time of discharge: Y = Yes, Blank =  No PT:   OT:   RN:   Equipment:   Other:     LOS: 3 days   Judie Bonus, MD 07/23/2013, 11:31 AM

## 2013-07-23 NOTE — Progress Notes (Signed)
  I have seen and examined the patient, and reviewed the daily progress note by Alicia Amel, MS 3 and discussed the care of the patient with them. Please see my progress note from 07/21/2013 for further details regarding assessment and plan.    Signed:  Vivi Barrack, MD 07/23/2013, 8:27 AM

## 2013-07-23 NOTE — Progress Notes (Signed)
D/c instructions reviewed with pt. Copy of instructions given to pt, pt to pick up script at his pharmacy. Pt d/c'd via wheelchair with belongings escorted by unit NT. Family (wife) picking pt up at entrance.

## 2013-07-23 NOTE — Progress Notes (Signed)
Internal Medicine Attending  Date: 07/23/2013  Patient name: [name] Medical record number: [id number] Date of birth: [birth year] Age: 72 y.o. Gender: male  I saw and evaluated the patient. I reviewed the resident's note by Dr. Dorise Hiss and I agree with the resident's findings and plans as documented in her progress note.  Creatinine now 1.52 and is near baseline.  Agree with discharge home to complete antibiotics for pyelonephritis with follow-up as outlined in Dr. Lavonna Monarch note.

## 2013-07-24 NOTE — Progress Notes (Signed)
Utilization review completed. Brylen Wagar RN CCM Case Mgmt  

## 2013-07-27 LAB — CULTURE, BLOOD (ROUTINE X 2): Culture: NO GROWTH

## 2013-08-28 ENCOUNTER — Encounter: Payer: Self-pay | Admitting: Internal Medicine

## 2013-08-28 ENCOUNTER — Ambulatory Visit (INDEPENDENT_AMBULATORY_CARE_PROVIDER_SITE_OTHER): Payer: Medicare Other | Admitting: Internal Medicine

## 2013-08-28 VITALS — BP 150/68 | HR 56 | Ht 68.0 in | Wt 244.1 lb

## 2013-08-28 DIAGNOSIS — I255 Ischemic cardiomyopathy: Secondary | ICD-10-CM

## 2013-08-28 DIAGNOSIS — I5022 Chronic systolic (congestive) heart failure: Secondary | ICD-10-CM

## 2013-08-28 DIAGNOSIS — I428 Other cardiomyopathies: Secondary | ICD-10-CM

## 2013-08-28 DIAGNOSIS — Z95810 Presence of automatic (implantable) cardiac defibrillator: Secondary | ICD-10-CM

## 2013-08-28 DIAGNOSIS — I2510 Atherosclerotic heart disease of native coronary artery without angina pectoris: Secondary | ICD-10-CM

## 2013-08-28 DIAGNOSIS — I2589 Other forms of chronic ischemic heart disease: Secondary | ICD-10-CM

## 2013-08-28 DIAGNOSIS — Z7901 Long term (current) use of anticoagulants: Secondary | ICD-10-CM

## 2013-08-28 DIAGNOSIS — I4891 Unspecified atrial fibrillation: Secondary | ICD-10-CM

## 2013-08-28 DIAGNOSIS — I10 Essential (primary) hypertension: Secondary | ICD-10-CM

## 2013-08-28 DIAGNOSIS — I480 Paroxysmal atrial fibrillation: Secondary | ICD-10-CM

## 2013-08-28 LAB — ICD DEVICE OBSERVATION
AL AMPLITUDE: 1.1 mv
ATRIAL PACING ICD: 6.31 pct
CHARGE TIME: 9.559 s
FVT: 0
LV LEAD IMPEDENCE ICD: 665 Ohm
LV LEAD THRESHOLD: 0.75 V
RV LEAD IMPEDENCE ICD: 418 Ohm
TOT-0001: 1
TZAT-0001ATACH: 1
TZAT-0001ATACH: 2
TZAT-0001ATACH: 3
TZAT-0002ATACH: NEGATIVE
TZAT-0002ATACH: NEGATIVE
TZAT-0002ATACH: NEGATIVE
TZAT-0012ATACH: 150 ms
TZAT-0018ATACH: NEGATIVE
TZAT-0018ATACH: NEGATIVE
TZAT-0018SLOWVT: NEGATIVE
TZAT-0019ATACH: 6 V
TZAT-0019ATACH: 6 V
TZAT-0019FASTVT: 8 V
TZAT-0019SLOWVT: 8 V
TZAT-0020FASTVT: 1.5 ms
TZAT-0020SLOWVT: 1.5 ms
TZON-0003SLOWVT: 360 ms
TZON-0004SLOWVT: 32
TZON-0004VSLOWVT: 32
TZON-0005SLOWVT: 12
TZST-0001ATACH: 5
TZST-0001ATACH: 6
TZST-0001FASTVT: 2
TZST-0001FASTVT: 3
TZST-0001FASTVT: 5
TZST-0001SLOWVT: 3
TZST-0001SLOWVT: 6
TZST-0002ATACH: NEGATIVE
TZST-0002ATACH: NEGATIVE
TZST-0002FASTVT: NEGATIVE
TZST-0002FASTVT: NEGATIVE
TZST-0002FASTVT: NEGATIVE
TZST-0002SLOWVT: NEGATIVE
TZST-0002SLOWVT: NEGATIVE
TZST-0002SLOWVT: NEGATIVE
VENTRICULAR PACING ICD: 94.32 pct

## 2013-08-28 NOTE — Progress Notes (Signed)
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 He underwent PCI several months ago and has done well since.  He had afib at the time of a UTI but none since.  He would like to stop amiodarone.  Today, he denies symptoms of palpitations, chest pain, shortness of breath,  lower extremity edema, dizziness, presyncope, syncope, or ICD shocks.  The patient is otherwise without complaint today.   Past Medical History  Diagnosis Date  . Type 2 diabetes mellitus   . Coronary atherosclerosis of native coronary artery     a. DES to RCA 03/2009. b. s/p PTCA to RCA for ISR, 05/2010. c. 03/2013 NSTEMI 2/2 severe prox RCA s/p PTCA/DES, med rx for residual dz.  . Hyperlipidemia   . TIA (transient ischemic attack)     Multiple TIAs  . Cardiomyopathy, ischemic     LVEF 25-35%.  . Morbid obesity   . BPH (benign prostatic hypertrophy)   . LBBB (left bundle branch block)     s/p BiV ICD Implanted by Dr Graciela Husbands  . Paroxysmal atrial fibrillation     a. Coumadin discontinue in 2010 when the patient required ASA/Plavix for stenting. b. Coumadin restarted 05/2013, Plavix continued, ASA stopped.   . Chronic kidney disease, stage III (moderate)     Followed by Dr. Fausto Skillern.  . Essential hypertension, benign   . Anemia-chronic     a. Pt reports h/o anemia - was offered IV iron in past by nephrologist but declined and has taken PO instead.  . Aneurysm, cerebral   . MRSA (methicillin resistant Staphylococcus aureus)   . Anemia   . Pulmonary nodule     CXR, 03/2013, [facility] - not described on any subsequent chest x-rays however, could be a vascular structure   Past Surgical History  Procedure Laterality Date  . Cholecystectomy    . Total knee arthroplasty    . Vasectomy    . Lung surgery    . Icd placement      Medtronic Protecta XT CRT-D    Current Outpatient Prescriptions  Medication Sig Dispense Refill  .  amiodarone (PACERONE) 400 MG tablet Take 200 mg by mouth daily.      . carvedilol (COREG) 12.5 MG tablet Take 12.5 mg by mouth 2 (two) times daily with a meal.      . ciprofloxacin (CIPRO) 500 MG tablet Take 1 tablet (500 mg total) by mouth 2 (two) times daily.  10 tablet  0  . clopidogrel (PLAVIX) 75 MG tablet Take 75 mg by mouth daily.      . ferrous sulfate 325 (65 FE) MG tablet Take 325 mg by mouth every evening.       . hydrALAZINE (APRESOLINE) 25 MG tablet Take 25 mg by mouth 2 (two) times daily.       Marland Kitchen HYDROcodone-acetaminophen (NORCO) 10-325 MG per tablet Take 1 tablet by mouth every 8 (eight) hours as needed for pain.       Marland Kitchen insulin lispro protamine-insulin lispro (HUMALOG 75/25) (75-25) 100 UNIT/ML SUSP Inject 15-25 Units into the skin 3 (three) times daily after meals. Will inject between 15 and 25 units each time blood sugar level is checked.      . isosorbide mononitrate (IMDUR) 30 MG 24 hr tablet Take 30 mg by mouth daily.      Marland Kitchen l-methylfolate-B6-B12 (METANX) 3-35-2 MG TABS Take 1 tablet by mouth daily.       Marland Kitchen  linagliptin (TRADJENTA) 5 MG TABS tablet Take 5 mg by mouth daily.      . nitroGLYCERIN (NITROSTAT) 0.4 MG SL tablet Place 0.4 mg under the tongue every 5 (five) minutes as needed for chest pain. For chest pain      . potassium chloride (K-DUR) 10 MEQ tablet Take 10 mEq by mouth daily.      . silver sulfADIAZINE (SILVADENE) 1 % cream Apply 1 application topically daily.      Marland Kitchen torsemide (DEMADEX) 10 MG tablet Take 10 mg by mouth as needed.       . warfarin (COUMADIN) 5 MG tablet Take 2.5-5 mg by mouth See admin instructions. Take 5mg  for two days then 2.5mg .... Repeat regimen      . Zinc 50 MG CAPS Take 50 mg by mouth daily.        No current facility-administered medications for this visit.    Physical Exam: Filed Vitals:   08/28/13 0927  BP: 150/68  Pulse: 56  Height: 5\' 8"  (1.727 m)  Weight: 244 lb 1.9 oz (110.732 kg)    GEN- The patient is well appearing,  alert and oriented x 3 today.   Head- normocephalic, atraumatic Eyes-  Sclera clear, conjunctiva pink Ears- hearing intact Oropharynx- clear Lungs- Clear to ausculation bilaterally, normal work of breathing Chest- ICD pocket is well healed Heart- Regular rate and rhythm, no murmurs, rubs or gallops, PMI not laterally displaced GI- soft, NT, ND, + BS Extremities- no clubbing, cyanosis, or edema  ICD interrogation- reviewed in detail today,  See PACEART report  Assessment and Plan:  1.  Chronic systolic dysfunction euvolemic today Stable on an appropriate medical regimen Normal BiVICD function See Pace Art report No changes today  2. HTN Above goal today He reports good BP control at home Consider Ace inhibitor if renal function is stable (will refer to nephrology) No changes  3. CAD Stable No change required today  4. Afib Continue long term anticoagualtion Maintaining sinus Stop amiodarone and follow  carelink Return in 1 year

## 2013-08-28 NOTE — Patient Instructions (Signed)
   Stop Amiodarone Continue all other medications.   Carelink - 12/01/2012 Your physician wants you to follow up in:  1 year.  You will receive a reminder letter in the mail one-two months in advance.  If you don't receive a letter, please call our office to schedule the follow up appointment - Dr. Johney Frame

## 2013-10-18 ENCOUNTER — Encounter: Payer: Self-pay | Admitting: Cardiology

## 2013-10-18 ENCOUNTER — Ambulatory Visit (INDEPENDENT_AMBULATORY_CARE_PROVIDER_SITE_OTHER): Payer: Medicare Other | Admitting: Cardiology

## 2013-10-18 VITALS — BP 116/68 | HR 54 | Ht 68.0 in | Wt 244.0 lb

## 2013-10-18 DIAGNOSIS — I2589 Other forms of chronic ischemic heart disease: Secondary | ICD-10-CM

## 2013-10-18 DIAGNOSIS — N183 Chronic kidney disease, stage 3 unspecified: Secondary | ICD-10-CM

## 2013-10-18 DIAGNOSIS — I2510 Atherosclerotic heart disease of native coronary artery without angina pectoris: Secondary | ICD-10-CM

## 2013-10-18 DIAGNOSIS — I4891 Unspecified atrial fibrillation: Secondary | ICD-10-CM

## 2013-10-18 DIAGNOSIS — I10 Essential (primary) hypertension: Secondary | ICD-10-CM

## 2013-10-18 DIAGNOSIS — I480 Paroxysmal atrial fibrillation: Secondary | ICD-10-CM

## 2013-10-18 DIAGNOSIS — I255 Ischemic cardiomyopathy: Secondary | ICD-10-CM

## 2013-10-18 NOTE — Assessment & Plan Note (Signed)
Symptomatically stable at this time on medical therapy, most recent intervention being DES to the RCA in May. Plan to continue observation, encouraged regular activity.

## 2013-10-18 NOTE — Patient Instructions (Signed)
Your physician recommends that you schedule a follow-up appointment in: 6 months. You will receive a reminder letter in the mail in about 4 months reminding you to call and schedule your appointment. If you don't receive this letter, please contact our office. Your physician recommends that you continue on your current medications as directed. Please refer to the Current Medication list given to you today. Your physician recommends that you have lab work in 6 months just before your next visit to check your BMET. You will be contacted when this is due.

## 2013-10-18 NOTE — Assessment & Plan Note (Signed)
Continues on Coumadin. Symptomatically well controlled. Amiodarone was discontinued by Dr. Johney Frame.

## 2013-10-18 NOTE — Assessment & Plan Note (Signed)
Keep followup with Dr. Befekadu. 

## 2013-10-18 NOTE — Progress Notes (Signed)
Clinical Summary [name] is a medically complex 72 y.o.male last seen in August. Interval followup with Dr. Johney [name] noted in October. He was taken off of amiodarone at that time. Now also being followed by Dr. Kristian [name].  He states that he has been feeling well. Still dances during the week, has more stamina. No active angina symptoms. He reports compliance with medications, modifications noted below. Blood pressure is much better controlled today. No palpitations.  Echocardiogram from May of this year demonstrated moderate LVH with LVEF 35-40%, grade 1 diastolic dysfunction, mildly dilated left atrium. Lab work in September showed potassium 4.3, BUN 33, creatinine 1.5, hemoglobin 12.0, platelets 163.   Allergies  Allergen Reactions  . Codeine     REACTION: blisters, but able to take hydrocodone    Current Outpatient Prescriptions  Medication Sig Dispense Refill  . acetaminophen (TYLENOL) 650 MG CR tablet Take 650 mg by mouth 2 (two) times daily.      . carvedilol (COREG) 12.5 MG tablet Take 18.75 mg by mouth 2 (two) times daily with a meal.       . clopidogrel (PLAVIX) 75 MG tablet Take 75 mg by mouth daily.      . hydrALAZINE (APRESOLINE) 25 MG tablet Take 25 mg by mouth 3 (three) times daily.       Marland Kitchen HYDROcodone-acetaminophen (NORCO) 10-325 MG per tablet Take 1 tablet by mouth every 8 (eight) hours as needed for pain.       Marland Kitchen insulin lispro protamine-insulin lispro (HUMALOG 75/25) (75-25) 100 UNIT/ML SUSP Inject 15-25 Units into the skin 3 (three) times daily after meals. Will inject between 15 and 25 units each time blood sugar level is checked.      . isosorbide mononitrate (IMDUR) 30 MG 24 hr tablet Take 30 mg by mouth daily.      Marland Kitchen l-methylfolate-B6-B12 (METANX) 3-35-2 MG TABS Take 1 tablet by mouth daily.       Marland Kitchen linagliptin (TRADJENTA) 5 MG TABS tablet Take 5 mg by mouth daily.      Marland Kitchen lisinopril (PRINIVIL,ZESTRIL) 5 MG tablet Take 1 tablet by mouth daily.      .  nitroGLYCERIN (NITROSTAT) 0.4 MG SL tablet Place 0.4 mg under the tongue every 5 (five) minutes as needed for chest pain. For chest pain      . potassium chloride (K-DUR) 10 MEQ tablet Take 5 mEq by mouth daily.       Marland Kitchen torsemide (DEMADEX) 10 MG tablet Take 10 mg by mouth daily.       Marland Kitchen warfarin (COUMADIN) 5 MG tablet Take 2.5-5 mg by mouth See admin instructions. Take 5mg  for two days then 2.5mg .... Repeat regimen      . Zinc 50 MG CAPS Take 50 mg by mouth daily.        No current facility-administered medications for this visit.    Past Medical History  Diagnosis Date  . Type 2 diabetes mellitus   . Coronary atherosclerosis of native coronary artery     a. DES to RCA 03/2009. b. s/p PTCA to RCA for ISR, 05/2010. c. 03/2013 NSTEMI 2/2 severe prox RCA s/p PTCA/DES, med rx for residual dz.  . Hyperlipidemia   . TIA (transient ischemic attack)     Multiple TIAs  . Cardiomyopathy, ischemic     LVEF 25-35%.  . Morbid obesity   . BPH (benign prostatic hypertrophy)   . LBBB (left bundle branch block)     s/p BiV ICD Implanted by  Dr [name]  . Paroxysmal atrial fibrillation     a. Coumadin discontinue in 2010 when the patient required ASA/Plavix for stenting. b. Coumadin restarted 05/2013, Plavix continued, ASA stopped.   . Chronic kidney disease, stage III (moderate)     Followed by Dr. Fausto [name].  . Essential hypertension, benign   . Anemia-chronic     a. Pt reports h/o anemia - was offered IV iron in past by nephrologist but declined and has taken PO instead.  . Aneurysm, cerebral   . MRSA (methicillin resistant Staphylococcus aureus)   . Anemia   . Pulmonary nodule     CXR, 03/2013, [facility] - not described on any subsequent chest x-rays however, could be a vascular structure    Past Surgical History  Procedure Laterality Date  . Cholecystectomy    . Total knee arthroplasty    . Vasectomy    . Lung surgery    . Icd placement      Medtronic Protecta XT CRT-D    Social History [name] reports that he quit smoking about 48 years ago. His smoking use included Cigarettes. He has a 6 pack-year smoking history. He quit smokeless tobacco use about 48 years ago. [name] reports that he drinks alcohol.  Review of Systems No device shocks. No palpitations or syncope. No reported bleeding problems. Otherwise as outlined above.  Physical Examination Filed Vitals:   10/18/13 0822  BP: 116/68  Pulse: 54   Filed Weights   10/18/13 0822  Weight: 244 lb (110.678 kg)    Appears comfortable.  HEENT: Conjunctiva and lids normal, oropharynx with moist mucosa.  Neck: Supple, no elevated JVP with increased girth.  Cardiac: Regular rate and rhythm, no S3.  Abdomen: Obese, nontender, bowel sounds present.  Skin: Warm and dry.  Extremities: Trace ankle edema.  Musculoskeletal: No kyphosis.  Neuropsychiatric: Alert and oriented x3, affect appropriate.   Problem List and Plan   CAD, NATIVE VESSEL Symptomatically stable at this time on medical therapy, most recent intervention being DES to the RCA in May. Plan to continue observation, encouraged regular activity.  Cardiomyopathy, ischemic LVEF 35-40% by echocardiogram in May. Medical regimen includes Coreg, hydralazine, Imdur, lisinopril, Demadex, potassium.  CKD (chronic kidney disease) stage 3, GFR 30-59 ml/min Keep followup with Dr. Kristian [name].  Essential hypertension, benign Good blood pressure control today. No changes made.  Paroxysmal atrial fibrillation Continues on Coumadin. Symptomatically well controlled. Amiodarone was discontinued by Dr. Johney [name].    [name], M.D., F.A.C.C.

## 2013-10-18 NOTE — Assessment & Plan Note (Signed)
Good blood pressure control today. No changes made. 

## 2013-10-18 NOTE — Assessment & Plan Note (Addendum)
LVEF 35-40% by echocardiogram in May. Medical regimen includes Coreg, hydralazine, Imdur, lisinopril, Demadex, potassium.

## 2013-10-29 ENCOUNTER — Other Ambulatory Visit: Payer: Self-pay | Admitting: Cardiology

## 2013-10-31 ENCOUNTER — Other Ambulatory Visit: Payer: Self-pay | Admitting: *Deleted

## 2013-10-31 NOTE — Telephone Encounter (Signed)
Received message from [facility] office that pt needs warfarin refill sent to [facility].  Per our record pt changed coumadin management to Dr Margaretmary Eddy office 7/14.  Left message on pt's cell phone to call Dr Margaretmary Eddy office for refills.

## 2013-12-01 ENCOUNTER — Encounter: Payer: Medicare Other | Admitting: *Deleted

## 2013-12-04 ENCOUNTER — Encounter: Payer: Self-pay | Admitting: *Deleted

## 2013-12-06 ENCOUNTER — Other Ambulatory Visit: Payer: Self-pay

## 2013-12-06 MED ORDER — ISOSORBIDE MONONITRATE ER 30 MG PO TB24: 30.0000 mg | ORAL_TABLET | Freq: Every day | ORAL | Status: DC

## 2014-01-04 ENCOUNTER — Telehealth: Payer: Self-pay | Admitting: Cardiology

## 2014-01-04 MED ORDER — HYDRALAZINE HCL 25 MG PO TABS: 25.0000 mg | ORAL_TABLET | Freq: Three times a day (TID) | ORAL | Status: DC

## 2014-01-04 NOTE — Telephone Encounter (Signed)
hydrALAZINE (APRESOLINE) 25 MG tablet  [facility]

## 2014-01-15 ENCOUNTER — Other Ambulatory Visit: Payer: Self-pay | Admitting: Cardiology

## 2014-01-15 MED ORDER — TORSEMIDE 10 MG PO TABS: 10.0000 mg | ORAL_TABLET | Freq: Every day | ORAL | Status: DC

## 2014-01-15 MED ORDER — CARVEDILOL 12.5 MG PO TABS: 18.7500 mg | ORAL_TABLET | Freq: Two times a day (BID) | ORAL | Status: DC

## 2014-01-22 ENCOUNTER — Other Ambulatory Visit: Payer: Self-pay | Admitting: *Deleted

## 2014-01-22 DIAGNOSIS — N183 Chronic kidney disease, stage 3 unspecified: Secondary | ICD-10-CM

## 2014-01-22 DIAGNOSIS — I10 Essential (primary) hypertension: Secondary | ICD-10-CM

## 2014-01-22 DIAGNOSIS — I480 Paroxysmal atrial fibrillation: Secondary | ICD-10-CM

## 2014-03-06 ENCOUNTER — Other Ambulatory Visit (HOSPITAL_COMMUNITY): Payer: Self-pay | Admitting: Nurse Practitioner

## 2014-03-06 ENCOUNTER — Ambulatory Visit (HOSPITAL_COMMUNITY)
Admission: RE | Admit: 2014-03-06 | Discharge: 2014-03-06 | Disposition: A | Discharge status: Home / Self Care | Payer: Medicare Other | Source: Ambulatory Visit | Attending: Nurse Practitioner | Admitting: Nurse Practitioner

## 2014-03-06 DIAGNOSIS — R52 Pain, unspecified: Secondary | ICD-10-CM

## 2014-03-06 DIAGNOSIS — M7989 Other specified soft tissue disorders: Secondary | ICD-10-CM | POA: Insufficient documentation

## 2014-03-06 DIAGNOSIS — R609 Edema, unspecified: Secondary | ICD-10-CM

## 2014-04-04 ENCOUNTER — Telehealth: Payer: Self-pay | Admitting: Neurology

## 2014-04-04 ENCOUNTER — Other Ambulatory Visit: Payer: Self-pay | Admitting: Cardiology

## 2014-04-04 MED ORDER — L-METHYLFOLATE-B6-B12 3-35-2 MG PO TABS: 1.0000 | ORAL_TABLET | Freq: Every day | ORAL | Status: DC

## 2014-04-04 NOTE — Telephone Encounter (Signed)
Order Providers    Prescribing Provider Encounter Provider   Jonelle Sidle, MD Melvern Sample, CMA         Medication Detail      Disp Refills Start End     l-methylfolate-B6-B12 [facility]) 3-35-2 MG TABS 60 tablet 6 04/04/2014     Sig - Route: Take 1 tablet by mouth daily. - Oral    E-Prescribing Status: Receipt confirmed by pharmacy (04/04/2014 10:41 AM EDT)                Pharmacy    [id number] - [place], [place] - [address]

## 2014-04-04 NOTE — Telephone Encounter (Signed)
Patient's wife calling to state that patient's Metanx script has expired and needs a new one. Please call and advise patient.

## 2014-04-04 NOTE — Telephone Encounter (Signed)
Chart notes show Dr Diona Browner is prescribing this Rx.  I called the number back that was left 3 times, and the line was busy each time.  I called the home line and spoke with the patient.  He is aware Rx was already filled by other provider.

## 2014-04-12 ENCOUNTER — Telehealth: Payer: Self-pay | Admitting: Cardiology

## 2014-04-12 ENCOUNTER — Other Ambulatory Visit: Payer: Self-pay

## 2014-04-12 MED ORDER — POTASSIUM CHLORIDE ER 10 MEQ PO TBCR: 5.0000 meq | EXTENDED_RELEASE_TABLET | Freq: Every day | ORAL | Status: DC

## 2014-04-12 MED ORDER — TORSEMIDE 10 MG PO TABS: 10.0000 mg | ORAL_TABLET | Freq: Every day | ORAL | Status: DC

## 2014-04-12 NOTE — Telephone Encounter (Signed)
KLOR-DUR M 10 ER TAB SAN TAKE ONE -HALF TABLET BY MOUTH ONCE DAILY WITH DEMADEX  [facility] [place]

## 2014-04-16 ENCOUNTER — Encounter: Payer: Self-pay | Admitting: Cardiology

## 2014-04-16 ENCOUNTER — Ambulatory Visit (INDEPENDENT_AMBULATORY_CARE_PROVIDER_SITE_OTHER): Payer: Medicare Other | Admitting: Cardiology

## 2014-04-16 VITALS — BP 154/68 | HR 56 | Ht 68.0 in | Wt 254.4 lb

## 2014-04-16 DIAGNOSIS — I4891 Unspecified atrial fibrillation: Secondary | ICD-10-CM

## 2014-04-16 DIAGNOSIS — I2510 Atherosclerotic heart disease of native coronary artery without angina pectoris: Secondary | ICD-10-CM

## 2014-04-16 DIAGNOSIS — Z95810 Presence of automatic (implantable) cardiac defibrillator: Secondary | ICD-10-CM

## 2014-04-16 DIAGNOSIS — I5022 Chronic systolic (congestive) heart failure: Secondary | ICD-10-CM

## 2014-04-16 DIAGNOSIS — I480 Paroxysmal atrial fibrillation: Secondary | ICD-10-CM

## 2014-04-16 DIAGNOSIS — E785 Hyperlipidemia, unspecified: Secondary | ICD-10-CM

## 2014-04-16 NOTE — Assessment & Plan Note (Signed)
Remains clinically stable on medical therapy, last intervention in 2014. He continues on Plavix.

## 2014-04-16 NOTE — Assessment & Plan Note (Signed)
Weight is up, he states that he is trying to start losing some weight. We discussed diet, also trying to stay active. For now no change in current diuretic regimen.

## 2014-04-16 NOTE — Assessment & Plan Note (Signed)
Continues on Coumadin. Symptomatically well controlled. Amiodarone discontinued by Dr. Johney Frame.

## 2014-04-16 NOTE — Assessment & Plan Note (Signed)
Follows with Dr. Shah

## 2014-04-16 NOTE — Progress Notes (Signed)
Clinical Summary [name] is a 73 y.o.male last seen in December 2014. He reports no significant angina symptoms, no hospitalizations since I last saw him. He has been trying to lose some weight, has been cutting back bread and other carbohydrates. Still dances occasionally at some of the local establishments.  He continues to follow with Dr. Kristian Covey for management of chronic kidney disease.  Echocardiogram from May 2014 demonstrated moderate LVH with LVEF 35-40%, grade 1 diastolic dysfunction, mildly dilated left atrium.  No device discharges, no palpitations or syncope.   Allergies  Allergen Reactions  . Codeine     REACTION: blisters, but able to take hydrocodone    Current Outpatient Prescriptions  Medication Sig Dispense Refill  . acetaminophen (TYLENOL) 650 MG CR tablet Take 650 mg by mouth 2 (two) times daily.      . carvedilol (COREG) 12.5 MG tablet Take 1.5 tablets (18.75 mg total) by mouth 2 (two) times daily with a meal.  90 tablet  6  . clopidogrel (PLAVIX) 75 MG tablet TAKE ONE TABLET BY MOUTH ONCE DAILY  30 tablet  6  . hydrALAZINE (APRESOLINE) 25 MG tablet Take 1 tablet (25 mg total) by mouth 3 (three) times daily.  90 tablet  6  . HYDROcodone-acetaminophen (NORCO) 10-325 MG per tablet Take 1 tablet by mouth every 8 (eight) hours as needed for pain.       Marland Kitchen insulin lispro protamine-insulin lispro (HUMALOG 75/25) (75-25) 100 UNIT/ML SUSP Inject 15-25 Units into the skin 3 (three) times daily after meals. Will inject between 15 and 25 units each time blood sugar level is checked.      . isosorbide mononitrate (IMDUR) 30 MG 24 hr tablet Take 1 tablet (30 mg total) by mouth daily.  30 tablet  6  . l-methylfolate-B6-B12 (METANX) 3-35-2 MG TABS Take 1 tablet by mouth daily.  60 tablet  6  . linagliptin (TRADJENTA) 5 MG TABS tablet Take 5 mg by mouth daily.      Marland Kitchen lisinopril (PRINIVIL,ZESTRIL) 5 MG tablet Take 1 tablet by mouth daily.      . nitroGLYCERIN (NITROSTAT) 0.4  MG SL tablet Place 0.4 mg under the tongue every 5 (five) minutes as needed for chest pain. For chest pain      . potassium chloride (K-DUR) 10 MEQ tablet Take 0.5 tablets (5 mEq total) by mouth daily.  45 tablet  3  . torsemide (DEMADEX) 10 MG tablet Take 1 tablet (10 mg total) by mouth daily.  30 tablet  6  . warfarin (COUMADIN) 5 MG tablet Take 2.5-5 mg by mouth See admin instructions. Take 5mg  for two days then 2.5mg .... Repeat regimen (PMD Managing)      . Zinc 50 MG CAPS Take 50 mg by mouth daily.        No current facility-administered medications for this visit.    Past Medical History  Diagnosis Date  . Type 2 diabetes mellitus   . Coronary atherosclerosis of native coronary artery     a. DES to RCA 03/2009. b. s/p PTCA to RCA for ISR, 05/2010. c. 03/2013 NSTEMI 2/2 severe prox RCA s/p PTCA/DES, med rx for residual dz.  . Hyperlipidemia   . TIA (transient ischemic attack)     Multiple TIAs  . Cardiomyopathy, ischemic     LVEF 25-35%.  . Morbid obesity   . BPH (benign prostatic hypertrophy)   . LBBB (left bundle branch block)     s/p BiV ICD Implanted by  Dr Graciela Husbands  . Paroxysmal atrial fibrillation     a. Coumadin discontinue in 2010 when the patient required ASA/Plavix for stenting. b. Coumadin restarted 05/2013, Plavix continued, ASA stopped.   . Chronic kidney disease, stage III (moderate)     Followed by Dr. Fausto Skillern.  . Essential hypertension, benign   . Anemia-chronic     a. Pt reports h/o anemia - was offered IV iron in past by nephrologist but declined and has taken PO instead.  . Aneurysm, cerebral   . MRSA (methicillin resistant Staphylococcus aureus)   . Anemia   . Pulmonary nodule     CXR, 03/2013, [facility] - not described on any subsequent chest x-rays however, could be a vascular structure    Past Surgical History  Procedure Laterality Date  . Cholecystectomy    . Total knee arthroplasty    . Vasectomy    . Lung surgery    . Icd placement      Medtronic Protecta  XT CRT-D    Social History [name] reports that he quit smoking about 49 years ago. His smoking use included Cigarettes. He has a 6 pack-year smoking history. He quit smokeless tobacco use about 49 years ago. [name] reports that he drinks alcohol.  Review of Systems No orthopnea or PND, no claudication, no leg edema. Otherwise as outlined.  Physical Examination Filed Vitals:   04/16/14 0906  BP: 154/68  Pulse: 56   Filed Weights   04/16/14 0906  Weight: 254 lb 6.4 oz (115.395 kg)    Appears comfortable.  HEENT: Conjunctiva and lids normal, oropharynx with moist mucosa.  Neck: Supple, no elevated JVP with increased girth.  Cardiac: Regular rate and rhythm, no S3.  Abdomen: Obese, nontender, bowel sounds present.  Skin: Warm and dry.  Extremities: Trace ankle edema.  Musculoskeletal: No kyphosis.  Neuropsychiatric: Alert and oriented x3, affect appropriate.   Problem List and Plan   CAD, NATIVE VESSEL Remains clinically stable on medical therapy, last intervention in 2014. He continues on Plavix.  Paroxysmal atrial fibrillation Continues on Coumadin. Symptomatically well controlled. Amiodarone discontinued by Dr. Johney Frame.  SYSTOLIC HEART FAILURE, CHRONIC Weight is up, he states that he is trying to start losing some weight. We discussed diet, also trying to stay active. For now no change in current diuretic regimen.  HYPERLIPIDEMIA-MIXED Follows with Dr. Sherryll Burger.  Biventricular ICD (implantable cardioverter-defibrillator) in place Keep followup with Dr. Johney Frame.    Jonelle Sidle, M.D., F.A.C.C.

## 2014-04-16 NOTE — Assessment & Plan Note (Signed)
Keep followup with Dr. Allred. 

## 2014-04-16 NOTE — Patient Instructions (Signed)

## 2014-04-17 ENCOUNTER — Encounter: Payer: Self-pay | Admitting: Cardiology

## 2014-06-04 ENCOUNTER — Telehealth: Payer: Self-pay | Admitting: Cardiology

## 2014-06-04 MED ORDER — CLOPIDOGREL BISULFATE 75 MG PO TABS: 75.0000 mg | ORAL_TABLET | Freq: Every day | ORAL | Status: DC

## 2014-06-04 NOTE — Telephone Encounter (Signed)
Done.  No answer to notify patient.

## 2014-06-04 NOTE — Telephone Encounter (Signed)
Patient needs refills on clopidogrel (PLAVIX) 75 MG tablet [facility]

## 2014-06-05 ENCOUNTER — Telehealth: Payer: Self-pay | Admitting: Cardiology

## 2014-06-05 MED ORDER — CLOPIDOGREL BISULFATE 75 MG PO TABS: 75.0000 mg | ORAL_TABLET | Freq: Every day | ORAL | Status: DC

## 2014-06-05 NOTE — Telephone Encounter (Signed)
Received fax refill request ° °Rx # [id number] °Medication:  Clopidogrel 75 mg tab °Qty 30 °Sig:  Take one tablet by mouth once daily °Physician:  [place] ° ° °

## 2014-06-05 NOTE — Telephone Encounter (Signed)
Refill request complete 

## 2014-06-06 ENCOUNTER — Telehealth: Payer: Self-pay | Admitting: Cardiology

## 2014-06-06 MED ORDER — CLOPIDOGREL BISULFATE 75 MG PO TABS: 75.0000 mg | ORAL_TABLET | Freq: Every day | ORAL | Status: DC

## 2014-06-06 NOTE — Telephone Encounter (Signed)
Received fax refill request  Rx # [id number] Medication:  Clopidogrel 75 mg tab Qty 30 Sig:  Take one tablet by mouth once daily Physician:  Diona Browner

## 2014-07-03 ENCOUNTER — Encounter: Payer: Self-pay | Admitting: Vascular Surgery

## 2014-07-05 ENCOUNTER — Emergency Department (HOSPITAL_COMMUNITY)
Admission: EM | Admit: 2014-07-05 | Discharge: 2014-07-05 | Disposition: A | Discharge status: Home / Self Care | Payer: Medicare Other | Attending: Emergency Medicine | Admitting: Emergency Medicine

## 2014-07-05 ENCOUNTER — Encounter (HOSPITAL_COMMUNITY): Payer: Self-pay | Admitting: Emergency Medicine

## 2014-07-05 DIAGNOSIS — Z87891 Personal history of nicotine dependence: Secondary | ICD-10-CM | POA: Diagnosis not present

## 2014-07-05 DIAGNOSIS — I2510 Atherosclerotic heart disease of native coronary artery without angina pectoris: Secondary | ICD-10-CM | POA: Diagnosis not present

## 2014-07-05 DIAGNOSIS — N183 Chronic kidney disease, stage 3 unspecified: Secondary | ICD-10-CM | POA: Insufficient documentation

## 2014-07-05 DIAGNOSIS — Z87448 Personal history of other diseases of urinary system: Secondary | ICD-10-CM | POA: Diagnosis not present

## 2014-07-05 DIAGNOSIS — E119 Type 2 diabetes mellitus without complications: Secondary | ICD-10-CM | POA: Insufficient documentation

## 2014-07-05 DIAGNOSIS — Z79899 Other long term (current) drug therapy: Secondary | ICD-10-CM | POA: Insufficient documentation

## 2014-07-05 DIAGNOSIS — Z8673 Personal history of transient ischemic attack (TIA), and cerebral infarction without residual deficits: Secondary | ICD-10-CM | POA: Diagnosis not present

## 2014-07-05 DIAGNOSIS — D649 Anemia, unspecified: Secondary | ICD-10-CM | POA: Insufficient documentation

## 2014-07-05 DIAGNOSIS — M545 Low back pain, unspecified: Secondary | ICD-10-CM | POA: Diagnosis present

## 2014-07-05 DIAGNOSIS — I129 Hypertensive chronic kidney disease with stage 1 through stage 4 chronic kidney disease, or unspecified chronic kidney disease: Secondary | ICD-10-CM | POA: Insufficient documentation

## 2014-07-05 DIAGNOSIS — Z7901 Long term (current) use of anticoagulants: Secondary | ICD-10-CM | POA: Diagnosis not present

## 2014-07-05 DIAGNOSIS — M5430 Sciatica, unspecified side: Secondary | ICD-10-CM | POA: Diagnosis not present

## 2014-07-05 DIAGNOSIS — I4891 Unspecified atrial fibrillation: Secondary | ICD-10-CM | POA: Diagnosis not present

## 2014-07-05 DIAGNOSIS — Z8614 Personal history of Methicillin resistant Staphylococcus aureus infection: Secondary | ICD-10-CM | POA: Insufficient documentation

## 2014-07-05 LAB — URINALYSIS, ROUTINE W REFLEX MICROSCOPIC
BILIRUBIN URINE: NEGATIVE
Glucose, UA: NEGATIVE mg/dL
HGB URINE DIPSTICK: NEGATIVE
KETONES UR: NEGATIVE mg/dL
Nitrite: NEGATIVE
Protein, ur: 30 mg/dL — AB
Specific Gravity, Urine: 1.017 (ref 1.005–1.030)
UROBILINOGEN UA: 0.2 mg/dL (ref 0.0–1.0)
pH: 5 (ref 5.0–8.0)

## 2014-07-05 LAB — URINE MICROSCOPIC-ADD ON

## 2014-07-05 LAB — BASIC METABOLIC PANEL
ANION GAP: 14 (ref 5–15)
BUN: 35 mg/dL — AB (ref 6–23)
CALCIUM: 9.1 mg/dL (ref 8.4–10.5)
CO2: 22 mEq/L (ref 19–32)
Chloride: 96 mEq/L (ref 96–112)
Creatinine, Ser: 1.79 mg/dL — ABNORMAL HIGH (ref 0.50–1.35)
GFR, EST AFRICAN AMERICAN: 41 mL/min — AB (ref >90)
GFR, EST NON AFRICAN AMERICAN: 36 mL/min — AB (ref >90)
Glucose, Bld: 134 mg/dL — ABNORMAL HIGH (ref 70–99)
Potassium: 4.8 mEq/L (ref 3.7–5.3)
Sodium: 132 mEq/L — ABNORMAL LOW (ref 137–147)

## 2014-07-05 LAB — CBC WITH DIFFERENTIAL/PLATELET
BASOS ABS: 0.1 10*3/uL (ref 0.0–0.1)
Basophils Relative: 1 % (ref 0–1)
EOS ABS: 0.2 10*3/uL (ref 0.0–0.7)
Eosinophils Relative: 3 % (ref 0–5)
HEMATOCRIT: 38.8 % — AB (ref 39.0–52.0)
HEMOGLOBIN: 13.1 g/dL (ref 13.0–17.0)
Lymphocytes Relative: 15 % (ref 12–46)
Lymphs Abs: 1.3 10*3/uL (ref 0.7–4.0)
MCH: 31.1 pg (ref 26.0–34.0)
MCHC: 33.8 g/dL (ref 30.0–36.0)
MCV: 92.2 fL (ref 78.0–100.0)
MONO ABS: 0.9 10*3/uL (ref 0.1–1.0)
MONOS PCT: 11 % (ref 3–12)
Neutro Abs: 6 10*3/uL (ref 1.7–7.7)
Neutrophils Relative %: 70 % (ref 43–77)
Platelets: 209 10*3/uL (ref 150–400)
RBC: 4.21 MIL/uL — ABNORMAL LOW (ref 4.22–5.81)
RDW: 15.1 % (ref 11.5–15.5)
WBC: 8.5 10*3/uL (ref 4.0–10.5)

## 2014-07-05 MED ORDER — HYDROCODONE-ACETAMINOPHEN 5-325 MG PO TABS
1.0000 | ORAL_TABLET | Freq: Once | ORAL | Status: AC
  Administered 2014-07-05: 1 via ORAL
  Filled 2014-07-05: qty 1

## 2014-07-05 NOTE — ED Provider Notes (Signed)
CSN: [id number]     Arrival date & time 07/05/14  1958 History  This chart was scribed for non-physician practitioner, Emilia Beck, PA-C working with Raeford Razor, MD by Greggory Stallion, ED scribe. This patient was seen in room TR05C/TR05C and the patient's care was started at 10:41 PM.   Chief Complaint  Patient presents with  . Back Pain   The history is provided by the patient. No language interpreter was used.   HPI Comments: [name] is a 73 y.o. male who presents to the Emergency Department complaining of gradual onset lower back pain that started yesterday. Pain radiates to bilateral legs intermittently. Denies injury. Pt has taken 10 mg hydrocodone with some relief. He has also used a heating pad with little relief. States he has never had back pain like this in the past. Also reports an intermittent headaches since yesterday. States he has a brain aneurysm. Denies chest pain, SOB, trouble breathing, abdominal pain, bowel or bladder incontinence. Pt has a PCP.   Past Medical History  Diagnosis Date  . Type 2 diabetes mellitus   . Coronary atherosclerosis of native coronary artery     a. DES to RCA 03/2009. b. s/p PTCA to RCA for ISR, 05/2010. c. 03/2013 NSTEMI 2/2 severe prox RCA s/p PTCA/DES, med rx for residual dz.  . Hyperlipidemia   . TIA (transient ischemic attack)     Multiple TIAs  . Cardiomyopathy, ischemic     LVEF 25-35%.  . Morbid obesity   . BPH (benign prostatic hypertrophy)   . LBBB (left bundle branch block)     s/p BiV ICD Implanted by Dr Graciela Husbands  . Paroxysmal atrial fibrillation     a. Coumadin discontinue in 2010 when the patient required ASA/Plavix for stenting. b. Coumadin restarted 05/2013, Plavix continued, ASA stopped.   . Chronic kidney disease, stage III (moderate)     Followed by Dr. Fausto Skillern.  . Essential hypertension, benign   . Anemia-chronic     a. Pt reports h/o anemia - was offered IV iron in past by nephrologist but declined and has taken  PO instead.  . Aneurysm, cerebral   . MRSA (methicillin resistant Staphylococcus aureus)   . Anemia   . Pulmonary nodule     CXR, 03/2013, [facility] - not described on any subsequent chest x-rays however, could be a vascular structure   Past Surgical History  Procedure Laterality Date  . Cholecystectomy    . Total knee arthroplasty    . Vasectomy    . Lung surgery    . Icd placement      Medtronic Protecta XT CRT-D   Family History  Problem Relation Age of Onset  . Heart disease Other     family h/o premature cardiovascular disease   History  Substance Use Topics  . Smoking status: Former Smoker -- 1.00 packs/day for 6 years    Types: Cigarettes    Quit date: 11/16/1964  . Smokeless tobacco: Former Neurosurgeon    Quit date: 04/15/1965  . Alcohol Use: Yes     Comment: once/month    Review of Systems  Respiratory: Negative for shortness of breath.   Cardiovascular: Negative for chest pain.  Gastrointestinal: Negative for abdominal pain.  Genitourinary:       Negative for bowel or bladder incontinence.  Musculoskeletal: Positive for back pain.  Neurological: Positive for headaches.  All other systems reviewed and are negative.  Allergies  Codeine  Home Medications   Prior to Admission medications  Medication Sig Start Date End Date Taking? Authorizing Provider  acetaminophen (TYLENOL) 650 MG CR tablet Take 650 mg by mouth 2 (two) times daily.    Historical Provider, MD  carvedilol (COREG) 12.5 MG tablet Take 1.5 tablets (18.75 mg total) by mouth 2 (two) times daily with a meal. 01/15/14   Jonelle Sidle, MD  clopidogrel (PLAVIX) 75 MG tablet Take 1 tablet (75 mg total) by mouth daily. 06/06/14   Jonelle Sidle, MD  hydrALAZINE (APRESOLINE) 25 MG tablet Take 1 tablet (25 mg total) by mouth 3 (three) times daily. 01/04/14   Jonelle Sidle, MD  HYDROcodone-acetaminophen (NORCO) 10-325 MG per tablet Take 1 tablet by mouth every 8 (eight) hours as needed for pain.  05/15/13    Historical Provider, MD  insulin lispro protamine-insulin lispro (HUMALOG 75/25) (75-25) 100 UNIT/ML SUSP Inject 15-25 Units into the skin 3 (three) times daily after meals. Will inject between 15 and 25 units each time blood sugar level is checked.    Historical Provider, MD  isosorbide mononitrate (IMDUR) 30 MG 24 hr tablet Take 1 tablet (30 mg total) by mouth daily. 12/06/13   Jonelle Sidle, MD  l-methylfolate-B6-B12 (METANX) 3-35-2 MG TABS Take 1 tablet by mouth daily. 04/04/14   Jonelle Sidle, MD  linagliptin (TRADJENTA) 5 MG TABS tablet Take 5 mg by mouth daily.    Historical Provider, MD  lisinopril (PRINIVIL,ZESTRIL) 5 MG tablet Take 1 tablet by mouth daily. 09/18/13   Historical Provider, MD  nitroGLYCERIN (NITROSTAT) 0.4 MG SL tablet Place 0.4 mg under the tongue every 5 (five) minutes as needed for chest pain. For chest pain 04/26/13   Rande Brunt, PA-C  potassium chloride (K-DUR) 10 MEQ tablet Take 0.5 tablets (5 mEq total) by mouth daily. 04/12/14   Jonelle Sidle, MD  torsemide (DEMADEX) 10 MG tablet Take 1 tablet (10 mg total) by mouth daily. 04/12/14   Jonelle Sidle, MD  warfarin (COUMADIN) 5 MG tablet Take 2.5-5 mg by mouth See admin instructions. Take 5mg  for two days then 2.5mg .... Repeat regimen (PMD Managing)    Historical Provider, MD  Zinc 50 MG CAPS Take 50 mg by mouth daily.     Historical Provider, MD   BP 102/55  Pulse 77  Temp(Src) 98.7 F (37.1 C) (Oral)  Resp 18  SpO2 93%  Physical Exam  Nursing note and vitals reviewed. Constitutional: He is oriented to person, place, and time. He appears well-developed and well-nourished. No distress.  HENT:  Head: Normocephalic and atraumatic.  Eyes: Conjunctivae and EOM are normal.  Neck: Neck supple. No tracheal deviation present.  Cardiovascular: Normal rate, regular rhythm and normal heart sounds.   Pulmonary/Chest: Effort normal and breath sounds normal. No respiratory distress. He has no wheezes. He has no  rales.  Abdominal: Soft. There is no tenderness.  Musculoskeletal: Normal range of motion.  No midline spine tenderness to palpation. Bilateral gluteal tenderness to palpation.  Neurological: He is alert and oriented to person, place, and time.  Extremity strength and sensation equal and intact.   Skin: Skin is warm and dry.  Psychiatric: He has a normal mood and affect. His behavior is normal.    ED Course  Procedures (including critical care time)  DIAGNOSTIC STUDIES: Oxygen Saturation is 93% on RA, adequate by my interpretation.    COORDINATION OF CARE: 10:50 PM-Temperature checked in pt room and is 98. Discussed treatment plan which includes pain medication with pt at bedside and pt  agreed to plan. Advised pt to follow up with his PCP in the morning.   Labs Review Labs Reviewed  CBC WITH DIFFERENTIAL - Abnormal; Notable for the following:    RBC 4.21 (*)    HCT 38.8 (*)    All other components within normal limits  BASIC METABOLIC PANEL - Abnormal; Notable for the following:    Sodium 132 (*)    Glucose, Bld 134 (*)    BUN 35 (*)    Creatinine, Ser 1.79 (*)    GFR calc non Af Amer 36 (*)    GFR calc Af Amer 41 (*)    All other components within normal limits  URINALYSIS, ROUTINE W REFLEX MICROSCOPIC - Abnormal; Notable for the following:    Protein, ur 30 (*)    Leukocytes, UA SMALL (*)    All other components within normal limits  URINE MICROSCOPIC-ADD ON - Abnormal; Notable for the following:    Squamous Epithelial / LPF FEW (*)    Casts HYALINE CASTS (*)    All other components within normal limits    Imaging Review No results found.   EKG Interpretation None      MDM   Final diagnoses:  Sciatica, unspecified laterality    Patient is a 73 year old male with extensive medical history. He presents to the ED with seemingly unprovoked back pain. Patient reports being on a riding mower yesterday which may have caused his back pain. Patient had labs and  urinalysis done here which is unremarkable for acute changes. Patient has reproducible pain on exam, consistent with sciatica. Vitals stable and patient afebrile. No bladder/bowel incontinence or saddle paresthesias. No abdominal pain to suggest AAA. Distal pulses intact. No chest pain/SOB. I doubt anything emergent at this time. Patient is able to ambulate without difficulty. Patient will follow up with PCP tomorrow. Patient instructed to return to the ED with worsening or concerning symptoms.   I personally performed the services described in this documentation, which was scribed in my presence. The recorded information has been reviewed and is accurate.  Emilia Beck, PA-C 07/06/14 [phone]

## 2014-07-05 NOTE — ED Notes (Signed)
Pt reports lower back pain since yesterday that radiates to bilateral legs. Pt denies injury. Denies numbness/tingling. Pt states he took 10 mg hydrocodone with relieved pain. Pt denies dysuria, urinary frequency. Pt ambulatory to triage. NAD.

## 2014-07-05 NOTE — Discharge Instructions (Signed)
Follow up with your doctor tomorrow. Refer to attached documents for more information.

## 2014-07-10 ENCOUNTER — Other Ambulatory Visit: Payer: Self-pay | Admitting: *Deleted

## 2014-07-10 ENCOUNTER — Encounter: Payer: Self-pay | Admitting: Vascular Surgery

## 2014-07-10 MED ORDER — ISOSORBIDE MONONITRATE ER 30 MG PO TB24: 30.0000 mg | ORAL_TABLET | Freq: Every day | ORAL | Status: DC

## 2014-07-10 MED ORDER — POTASSIUM CHLORIDE ER 10 MEQ PO TBCR: 5.0000 meq | EXTENDED_RELEASE_TABLET | Freq: Every day | ORAL | Status: DC

## 2014-07-10 NOTE — ED Provider Notes (Signed)
Medical screening examination/treatment/procedure(s) were performed by non-physician practitioner and as supervising physician I was immediately available for consultation/collaboration.   EKG Interpretation None       Ethan Clayburn, MD 07/10/14 1612 

## 2014-07-11 ENCOUNTER — Encounter: Payer: Self-pay | Admitting: Vascular Surgery

## 2014-07-11 ENCOUNTER — Ambulatory Visit (INDEPENDENT_AMBULATORY_CARE_PROVIDER_SITE_OTHER): Payer: Medicare Other | Admitting: Vascular Surgery

## 2014-07-11 VITALS — BP 171/72 | HR 58 | Resp 18 | Ht 68.0 in | Wt 254.2 lb

## 2014-07-11 DIAGNOSIS — I739 Peripheral vascular disease, unspecified: Secondary | ICD-10-CM | POA: Insufficient documentation

## 2014-07-11 DIAGNOSIS — I2510 Atherosclerotic heart disease of native coronary artery without angina pectoris: Secondary | ICD-10-CM

## 2014-07-11 NOTE — Addendum Note (Signed)
Addended by: Sharee Pimple on: 07/11/2014 03:14 PM   Modules accepted: Orders

## 2014-07-11 NOTE — Progress Notes (Signed)
[facility] [place] HISTORY AND PHYSICAL   History of Present Illness:  Patient is a 73 y.o. year old male who presents for evaluation of left leg pain.  He gets pain that starts in the left foot and proceeds up into the calf after dancing for several songs.   It subsides after a few minutes rest.  Right leg similar but much less.  He does get similar pain with vigorous walking.  He denies rest pain or non healing wounds on feet.  Other medical problems include CAD, hypertension, hyperlipidemia, obesity, DM, a fib, CKD III all of which are currently stable.  He is on Plavix and warfarin.  He is a former smoker.  Past Medical History  Diagnosis Date  . Type 2 diabetes mellitus   . Coronary atherosclerosis of native coronary artery     a. DES to RCA 03/2009. b. s/p PTCA to RCA for ISR, 05/2010. c. 03/2013 NSTEMI 2/2 severe prox RCA s/p PTCA/DES, med rx for residual dz.  . Hyperlipidemia   . TIA (transient ischemic attack)     Multiple TIAs  . Cardiomyopathy, ischemic     LVEF 25-35%.  . Morbid obesity   . BPH (benign prostatic hypertrophy)   . LBBB (left bundle branch block)     s/p BiV ICD Implanted by Dr Graciela Husbands  . Paroxysmal atrial fibrillation     a. Coumadin discontinue in 2010 when the patient required ASA/Plavix for stenting. b. Coumadin restarted 05/2013, Plavix continued, ASA stopped.   . Chronic kidney disease, stage III (moderate)     Followed by Dr. Fausto Skillern.  . Essential hypertension, benign   . Anemia-chronic     a. Pt reports h/o anemia - was offered IV iron in past by nephrologist but declined and has taken PO instead.  . Aneurysm, cerebral   . MRSA (methicillin resistant Staphylococcus aureus)   . Anemia   . Pulmonary nodule     CXR, 03/2013, [facility] - not described on any subsequent chest x-rays however, could be a vascular structure  . Myocardial infarction     Past Surgical History  Procedure Laterality Date  . Cholecystectomy    . Total knee  arthroplasty    . Vasectomy    . Lung surgery    . Icd placement      Medtronic Protecta XT CRT-D    Social History History  Substance Use Topics  . Smoking status: Former Smoker -- 1.00 packs/day for 6 years    Types: Cigarettes    Quit date: 11/16/1964  . Smokeless tobacco: Former Neurosurgeon    Quit date: 04/15/1965  . Alcohol Use: Yes     Comment: once/month    Family History Family History  Problem Relation Age of Onset  . Heart disease Other     family h/o premature cardiovascular disease  . Diabetes Mother   . Heart disease Mother   . Hypertension Mother   . Cancer Father   . Diabetes Father   . Cancer Sister     Allergies  Allergies  Allergen Reactions  . Codeine Other (See Comments)    REACTION: blisters, but able to take hydrocodone     Current Outpatient Prescriptions  Medication Sig Dispense Refill  . acetaminophen (TYLENOL) 650 MG CR tablet Take 650 mg by mouth 2 (two) times daily.      . carvedilol (COREG) 12.5 MG tablet Take 1.5 tablets (18.75 mg total) by mouth 2 (two) times daily with a meal.  90 tablet  6  . clopidogrel (PLAVIX) 75 MG tablet Take 1 tablet (75 mg total) by mouth daily.  30 tablet  6  . hydrALAZINE (APRESOLINE) 25 MG tablet Take 1 tablet (25 mg total) by mouth 3 (three) times daily.  90 tablet  6  . HYDROcodone-acetaminophen (NORCO) 10-325 MG per tablet Take 1 tablet by mouth every 8 (eight) hours as needed for pain.       Marland Kitchen insulin lispro protamine-insulin lispro (HUMALOG 75/25) (75-25) 100 UNIT/ML SUSP Inject 15-25 Units into the skin 3 (three) times daily after meals. Will inject between 15 and 25 units each time blood sugar level is checked.      . isosorbide mononitrate (IMDUR) 30 MG 24 hr tablet Take 1 tablet (30 mg total) by mouth daily.  30 tablet  6  . l-methylfolate-B6-B12 (METANX) 3-35-2 MG TABS Take 1 tablet by mouth daily.  60 tablet  6  . linagliptin (TRADJENTA) 5 MG TABS tablet Take 5 mg by mouth daily.      Marland Kitchen lisinopril  (PRINIVIL,ZESTRIL) 5 MG tablet Take 5 mg by mouth daily.       . nitroGLYCERIN (NITROSTAT) 0.4 MG SL tablet Place 0.4 mg under the tongue every 5 (five) minutes as needed for chest pain. For chest pain      . potassium chloride (K-DUR) 10 MEQ tablet Take 0.5 tablets (5 mEq total) by mouth daily.  45 tablet  3  . torsemide (DEMADEX) 10 MG tablet Take 1 tablet (10 mg total) by mouth daily.  30 tablet  6  . warfarin (COUMADIN) 5 MG tablet Take 2.5-5 mg by mouth daily at 6 PM. Takes 2.5mg  on mon only Takes 5mg  all other days      . Zinc 50 MG CAPS Take 50 mg by mouth daily.        No current facility-administered medications for this visit.    ROS:   General:  No weight loss, Fever, chills  HEENT: No recent headaches, no nasal bleeding, no visual changes, no sore throat  Neurologic: No dizziness, blackouts, seizures. No recent symptoms of stroke or mini- stroke. No recent episodes of slurred speech, or temporary blindness.  Cardiac: No recent episodes of chest pain/pressure, no shortness of breath at rest.  + shortness of breath with exertion.  Denies history of atrial fibrillation or irregular heartbeat  Vascular: No history of rest pain in feet. + history of claudication.  No history of non-healing ulcer, No history of DVT   Pulmonary: No home oxygen, no productive cough, no hemoptysis,  No asthma or wheezing  Musculoskeletal:  [x ] Arthritis, [x ] Low back pain,  [ ]  Joint pain  Hematologic:No history of hypercoagulable state.  No history of easy bleeding.  No history of anemia  Gastrointestinal: No hematochezia or melena,  No gastroesophageal reflux, no trouble swallowing  Urinary: [x ] chronic Kidney disease, [ ]  on HD - [ ]  MWF or [ ]  TTHS, [ ]  Burning with urination, [ ]  Frequent urination, [ ]  Difficulty urinating;   Skin: No rashes  Psychological: No history of anxiety,  No history of depression   Physical Examination  Filed Vitals:   07/11/14 1202  BP: 171/72  Pulse:  58  Resp: 18  Height: 5\' 8"  (1.727 m)  Weight: 254 lb 3.2 oz (115.304 kg)    Body mass index is 38.66 kg/(m^2).  General:  Alert and oriented, no acute distress HEENT: Normal Neck: No bruit or JVD Pulmonary: Clear  to auscultation bilaterally Cardiac: Regular Rate and Rhythm without murmur Abdomen: Soft, non-tender, non-distended, no mass, obese Skin: No rash Extremity Pulses:  2+ radial, brachial, femoral,absent dorsalis pedis, posterior tibial pulses bilaterally Musculoskeletal: No deformity or edema  Neurologic: Upper and lower extremity motor 5/5 and symmetric  DATA:  Bilateral ABI Insight imaging dated 06/25/14.  Right 0.9, left 0.8, duplex suggestive of tibial disease right, left side SFA and tibial disease   ASSESSMENT:  Left lower extremity claudication not really disabling to him at this time.  Angiogram would be higher risk with his renal dysfunction   PLAN:  Walking program of 30 min daily follow up with [place] with repeat ABI 6 months.  Fabienne Bruns, MD [facility] Office: [phone] Pager: [phone]

## 2014-08-06 ENCOUNTER — Other Ambulatory Visit: Payer: Self-pay | Admitting: *Deleted

## 2014-08-06 MED ORDER — HYDRALAZINE HCL 25 MG PO TABS: 25.0000 mg | ORAL_TABLET | Freq: Three times a day (TID) | ORAL | Status: DC

## 2014-08-13 ENCOUNTER — Other Ambulatory Visit: Payer: Self-pay | Admitting: *Deleted

## 2014-08-13 MED ORDER — CARVEDILOL 12.5 MG PO TABS
18.7500 mg | ORAL_TABLET | Freq: Two times a day (BID) | ORAL | Status: DC
Start: 2014-08-13 — End: 2015-04-08

## 2014-09-07 ENCOUNTER — Ambulatory Visit (INDEPENDENT_AMBULATORY_CARE_PROVIDER_SITE_OTHER): Payer: Medicare Other | Admitting: Internal Medicine

## 2014-09-07 ENCOUNTER — Encounter: Payer: Self-pay | Admitting: Internal Medicine

## 2014-09-07 VITALS — BP 154/77 | HR 57 | Ht 68.0 in | Wt 254.0 lb

## 2014-09-07 DIAGNOSIS — I480 Paroxysmal atrial fibrillation: Secondary | ICD-10-CM

## 2014-09-07 DIAGNOSIS — I5022 Chronic systolic (congestive) heart failure: Secondary | ICD-10-CM

## 2014-09-07 DIAGNOSIS — I255 Ischemic cardiomyopathy: Secondary | ICD-10-CM

## 2014-09-07 DIAGNOSIS — I2510 Atherosclerotic heart disease of native coronary artery without angina pectoris: Secondary | ICD-10-CM

## 2014-09-07 DIAGNOSIS — I2589 Other forms of chronic ischemic heart disease: Secondary | ICD-10-CM

## 2014-09-07 LAB — MDC_IDC_ENUM_SESS_TYPE_INCLINIC
Brady Statistic AP VS Percent: 0.12 %
Brady Statistic AS VS Percent: 0.2 %
Brady Statistic RA Percent Paced: 5.06 %
Brady Statistic RV Percent Paced: 99.68 %
HIGH POWER IMPEDANCE MEASURED VALUE: 361 Ohm
HIGH POWER IMPEDANCE MEASURED VALUE: 46 Ohm
HIGH POWER IMPEDANCE MEASURED VALUE: 59 Ohm
HighPow Impedance: 171 Ohm
Lead Channel Impedance Value: 1064 Ohm
Lead Channel Impedance Value: 456 Ohm
Lead Channel Impedance Value: 475 Ohm
Lead Channel Impedance Value: 722 Ohm
Lead Channel Pacing Threshold Amplitude: 0.5 V
Lead Channel Pacing Threshold Amplitude: 0.75 V
Lead Channel Pacing Threshold Amplitude: 0.75 V
Lead Channel Pacing Threshold Pulse Width: 0.4 ms
Lead Channel Pacing Threshold Pulse Width: 0.4 ms
Lead Channel Sensing Intrinsic Amplitude: 0.75 mV
Lead Channel Sensing Intrinsic Amplitude: 8.875 mV
Lead Channel Setting Pacing Amplitude: 2 V
Lead Channel Setting Pacing Amplitude: 2 V
Lead Channel Setting Pacing Pulse Width: 0.4 ms
Lead Channel Setting Pacing Pulse Width: 0.4 ms
Lead Channel Setting Sensing Sensitivity: 0.3 mV
MDC IDC MSMT BATTERY VOLTAGE: 3.04 V
MDC IDC MSMT LEADCHNL RA IMPEDANCE VALUE: 475 Ohm
MDC IDC MSMT LEADCHNL RA PACING THRESHOLD PULSEWIDTH: 0.4 ms
MDC IDC SESS DTM: 20151023101135
MDC IDC SET LEADCHNL RV PACING AMPLITUDE: 2.5 V
MDC IDC SET ZONE DETECTION INTERVAL: 300 ms
MDC IDC SET ZONE DETECTION INTERVAL: 350 ms
MDC IDC STAT BRADY AP VP PERCENT: 4.94 %
MDC IDC STAT BRADY AS VP PERCENT: 94.74 %
Zone Setting Detection Interval: 360 ms
Zone Setting Detection Interval: 400 ms

## 2014-09-07 NOTE — Progress Notes (Signed)
PCP: [name], MD Primary Cardiologist:  [name] is a 73 y.o. male who presents today for routine electrophysiology followup. [name] remains active.  [name] still goes dancing three nights per week.  Today, [name] denies symptoms of palpitations, chest pain, shortness of breath,  lower extremity edema, dizziness, presyncope, syncope, or ICD shocks.  The patient is otherwise without complaint today.  [name] says that his renal function is stable.   Past Medical History  Diagnosis Date  . Type 2 diabetes mellitus   . Coronary atherosclerosis of native coronary artery     a. DES to RCA 03/2009. b. s/p PTCA to RCA for ISR, 05/2010. c. 03/2013 NSTEMI 2/2 severe prox RCA s/p PTCA/DES, med rx for residual dz.  . Hyperlipidemia   . TIA (transient ischemic attack)     Multiple TIAs  . Cardiomyopathy, ischemic     LVEF 25-35%.  . Morbid obesity   . BPH (benign prostatic hypertrophy)   . LBBB (left bundle branch block)     s/p BiV ICD Implanted by Dr Graciela Husbands  . Paroxysmal atrial fibrillation     a. Coumadin discontinue in 2010 when the patient required ASA/Plavix for stenting. b. Coumadin restarted 05/2013, Plavix continued, ASA stopped.   . Chronic kidney disease, stage III (moderate)     Followed by Dr. Fausto Skillern.  . Essential hypertension, benign   . Anemia-chronic     a. Pt reports h/o anemia - was offered IV iron in past by nephrologist but declined and has taken PO instead.  . Aneurysm, cerebral   . MRSA (methicillin resistant Staphylococcus aureus)   . Anemia   . Pulmonary nodule     CXR, 03/2013, [facility] - not described on any subsequent chest x-rays however, could be a vascular structure  . Myocardial infarction    Past Surgical History  Procedure Laterality Date  . Cholecystectomy    . Total knee arthroplasty    . Vasectomy    . Lung surgery    . Icd placement      Medtronic Protecta XT CRT-D    Current Outpatient Prescriptions  Medication Sig Dispense Refill  . acetaminophen  (TYLENOL) 650 MG CR tablet Take 650 mg by mouth 2 (two) times daily.      . carvedilol (COREG) 12.5 MG tablet Take 1.5 tablets (18.75 mg total) by mouth 2 (two) times daily with a meal.  90 tablet  6  . clopidogrel (PLAVIX) 75 MG tablet Take 1 tablet (75 mg total) by mouth daily.  30 tablet  6  . hydrALAZINE (APRESOLINE) 25 MG tablet Take 1 tablet (25 mg total) by mouth 3 (three) times daily.  90 tablet  6  . HYDROcodone-acetaminophen (NORCO) 10-325 MG per tablet Take 1 tablet by mouth every 8 (eight) hours as needed for pain.       Marland Kitchen insulin lispro protamine-insulin lispro (HUMALOG 75/25) (75-25) 100 UNIT/ML SUSP Inject 15-25 Units into the skin 3 (three) times daily after meals. Will inject between 15 and 25 units each time blood sugar level is checked.      . isosorbide mononitrate (IMDUR) 30 MG 24 hr tablet Take 1 tablet (30 mg total) by mouth daily.  30 tablet  6  . l-methylfolate-B6-B12 (METANX) 3-35-2 MG TABS Take 1 tablet by mouth daily.  60 tablet  6  . levothyroxine (SYNTHROID, LEVOTHROID) 25 MCG tablet Take 1 tablet by mouth daily.      Marland Kitchen linagliptin (TRADJENTA) 5 MG TABS tablet Take 5  mg by mouth daily.      Marland Kitchen lisinopril (PRINIVIL,ZESTRIL) 5 MG tablet Take 5 mg by mouth daily.       . nitroGLYCERIN (NITROSTAT) 0.4 MG SL tablet Place 0.4 mg under the tongue every 5 (five) minutes as needed for chest pain. For chest pain      . potassium chloride (K-DUR) 10 MEQ tablet Take 0.5 tablets (5 mEq total) by mouth daily.  45 tablet  3  . tamsulosin (FLOMAX) 0.4 MG CAPS capsule Take 1 capsule by mouth daily as needed.      . torsemide (DEMADEX) 10 MG tablet Take 1 tablet (10 mg total) by mouth daily.  30 tablet  6  . warfarin (COUMADIN) 5 MG tablet Take 2.5-5 mg by mouth daily at 6 PM. Takes 2.5mg  on mon only Takes 5mg  all other days      . Zinc 50 MG CAPS Take 50 mg by mouth daily.        No current facility-administered medications for this visit.    Physical Exam: Filed Vitals:    09/07/14 0954  BP: 154/77  Pulse: 57  Height: 5\' 8"  (1.727 m)  Weight: 254 lb (115.214 kg)    GEN- The patient is well appearing, alert and oriented x 3 today.   Head- normocephalic, atraumatic Eyes-  Sclera clear, conjunctiva pink Ears- hearing intact Oropharynx- clear Lungs- Clear to ausculation bilaterally, normal work of breathing Chest- ICD pocket is well healed Heart- Regular rate and rhythm, no murmurs, rubs or gallops, PMI not laterally displaced GI- soft, NT, ND, + BS Extremities- no clubbing, cyanosis, or edema  ICD interrogation- reviewed in detail today,  See PACEART report  Assessment and Plan:  1.  Chronic systolic dysfunction euvolemic today Stable on an appropriate medical regimen Normal BiVICD function See Pace Art report No changes today Enroll in ICM clinic to follow optivol long term  2. Hypertensive cardiovascular disease Stable No change required today Consider Ace inhibitor if renal function is stable (will refer to nephrology)  3. CAD Stable No change required today  4. Afib Continue long term anticoagualtion Maintaining sinus  carelink Return in 1 year

## 2014-09-07 NOTE — Patient Instructions (Signed)
Your physician recommends that you schedule a follow-up appointment in: 1 year with Dr. Johney Frame. You will receive a reminder letter in the mail in about 10 months reminding you to call and schedule your appointment. If you don't receive this letter, please contact our office. Carelink Device check on 12/10/2014. Your physician recommends that you continue on your current medications as directed. Please refer to the Current Medication list given to you today.

## 2014-10-15 ENCOUNTER — Telehealth: Payer: Self-pay | Admitting: Cardiology

## 2014-10-15 ENCOUNTER — Encounter: Payer: Medicare Other | Admitting: *Deleted

## 2014-10-15 NOTE — Telephone Encounter (Signed)
Spoke with pt and reminded pt of remote transmission that is due today. Pt verbalized understanding.   

## 2014-10-24 ENCOUNTER — Encounter: Payer: Self-pay | Admitting: Cardiology

## 2014-10-24 ENCOUNTER — Ambulatory Visit (INDEPENDENT_AMBULATORY_CARE_PROVIDER_SITE_OTHER): Payer: Medicare Other | Admitting: Cardiology

## 2014-10-24 VITALS — BP 148/78 | HR 60 | Ht 68.0 in | Wt 254.0 lb

## 2014-10-24 DIAGNOSIS — I255 Ischemic cardiomyopathy: Secondary | ICD-10-CM

## 2014-10-24 DIAGNOSIS — I480 Paroxysmal atrial fibrillation: Secondary | ICD-10-CM

## 2014-10-24 DIAGNOSIS — I2510 Atherosclerotic heart disease of native coronary artery without angina pectoris: Secondary | ICD-10-CM

## 2014-10-24 NOTE — Progress Notes (Signed)
Reason for visit: CAD, cardiomyopathy, PAF  Clinical Summary Mr. Bonito is a medically complex 73 y.o.male last seen in June. Interval follow-up with Allred noted in October. He is doing fairly well. He reports NYHA class II dyspnea, no angina. Still goes out to dance 3 days a week. He has had no cardiac hospitalizations. We reviewed his medications, he continues on Plavix, Coreg, hydralazine, lisinopril, and Demadex with potassium supplements. Weight is stable compared to the last visit. He reports no leg edema or orthopnea. ECG today shows a ventricular paced rhythm with atrial sensing.  Echocardiogram from May 2014 demonstrated moderate LVH with LVEF 35-40%, grade 1 diastolic dysfunction, mildly dilated left atrium. He is status post biventricular ICD.  He reports no palpitations, has remained in sinus rhythm. He continues on Coumadin. Reports no bleeding problems.  Labwork from August showed hemoglobin 13.1, platelets 209, potassium 4.8, BUN 35, creatinine 1.7.  Allergies  Allergen Reactions  . Codeine Other (See Comments)    REACTION: blisters, but able to take hydrocodone    Current Outpatient Prescriptions  Medication Sig Dispense Refill  . acetaminophen (TYLENOL) 650 MG CR tablet Take 650 mg by mouth 2 (two) times daily.    . carvedilol (COREG) 12.5 MG tablet Take 1.5 tablets (18.75 mg total) by mouth 2 (two) times daily with a meal. 90 tablet 6  . clopidogrel (PLAVIX) 75 MG tablet Take 1 tablet (75 mg total) by mouth daily. 30 tablet 6  . hydrALAZINE (APRESOLINE) 25 MG tablet Take 1 tablet (25 mg total) by mouth 3 (three) times daily. 90 tablet 6  . HYDROcodone-acetaminophen (NORCO) 10-325 MG per tablet Take 1 tablet by mouth every 8 (eight) hours as needed for pain.     Marland Kitchen insulin lispro protamine-insulin lispro (HUMALOG 75/25) (75-25) 100 UNIT/ML SUSP Inject 15-25 Units into the skin 3 (three) times daily after meals. Will inject between 15 and 25 units each time blood sugar  level is checked.    . isosorbide mononitrate (IMDUR) 30 MG 24 hr tablet Take 1 tablet (30 mg total) by mouth daily. 30 tablet 6  . l-methylfolate-B6-B12 (METANX) 3-35-2 MG TABS Take 1 tablet by mouth daily. 60 tablet 6  . levothyroxine (SYNTHROID, LEVOTHROID) 25 MCG tablet Take 1 tablet by mouth daily.    Marland Kitchen linagliptin (TRADJENTA) 5 MG TABS tablet Take 5 mg by mouth daily.    Marland Kitchen lisinopril (PRINIVIL,ZESTRIL) 5 MG tablet Take 5 mg by mouth daily.     . nitroGLYCERIN (NITROSTAT) 0.4 MG SL tablet Place 0.4 mg under the tongue every 5 (five) minutes as needed for chest pain. For chest pain    . potassium chloride (K-DUR) 10 MEQ tablet Take 0.5 tablets (5 mEq total) by mouth daily. 45 tablet 3  . tamsulosin (FLOMAX) 0.4 MG CAPS capsule Take 1 capsule by mouth daily as needed.    . torsemide (DEMADEX) 10 MG tablet Take 1 tablet (10 mg total) by mouth daily. 30 tablet 6  . warfarin (COUMADIN) 5 MG tablet Take 2.5-5 mg by mouth daily at 6 PM. Takes 2.5mg  on mon only Takes 5mg  all other days    . Zinc 50 MG CAPS Take 50 mg by mouth daily.      No current facility-administered medications for this visit.    Past Medical History  Diagnosis Date  . Type 2 diabetes mellitus   . Coronary atherosclerosis of native coronary artery     a. DES to RCA 03/2009. b. s/p PTCA to RCA for  ISR, 05/2010. c. 03/2013 NSTEMI 2/2 severe prox RCA s/p PTCA/DES, med rx for residual dz.  . Hyperlipidemia   . TIA (transient ischemic attack)     Multiple TIAs  . Cardiomyopathy, ischemic     LVEF 25-35%.  . Morbid obesity   . BPH (benign prostatic hypertrophy)   . LBBB (left bundle branch block)     s/p BiV ICD Implanted by Dr Graciela Husbands  . Paroxysmal atrial fibrillation     a. Coumadin discontinue in 2010 when the patient required ASA/Plavix for stenting. b. Coumadin restarted 05/2013, Plavix continued, ASA stopped.   . Chronic kidney disease, stage III (moderate)     Followed by Dr. Fausto Skillern.  . Essential hypertension, benign    . Anemia-chronic     a. Pt reports h/o anemia - was offered IV iron in past by nephrologist but declined and has taken PO instead.  . Aneurysm, cerebral   . MRSA (methicillin resistant Staphylococcus aureus)   . Anemia   . Pulmonary nodule     CXR, 03/2013, [facility] - not described on any subsequent chest x-rays however, could be a vascular structure  . Myocardial infarction     Past Surgical History  Procedure Laterality Date  . Cholecystectomy    . Total knee arthroplasty    . Vasectomy    . Lung surgery    . Icd placement      Medtronic Protecta XT CRT-D    Social History Mr. Venuto reports that he quit smoking about 49 years ago. His smoking use included Cigarettes. He started smoking about 64 years ago. He has a 6 pack-year smoking history. He quit smokeless tobacco use about 49 years ago. Mr. Robles reports that he drinks alcohol.  Review of Systems Complete review of systems negative except as otherwise outlined in the clinical summary and also the following. Has trouble with arthritic pains in his hands and knees.  Physical Examination Filed Vitals:   10/24/14 1122  BP: 148/78  Pulse: 60   Filed Weights   10/24/14 1122  Weight: 254 lb (115.214 kg)    Appears comfortable.  HEENT: Conjunctiva and lids normal, oropharynx with moist mucosa.  Neck: Supple, no elevated JVP with increased girth.  Cardiac: Regular rate and rhythm, no S3.  Abdomen: Obese, nontender, bowel sounds present.  Skin: Warm and dry.  Extremities: Trace ankle edema.  Musculoskeletal: No kyphosis.  Neuropsychiatric: Alert and oriented x3, affect appropriate.   Problem List and Plan   Cardiomyopathy, ischemic Symptomatically stable without active angina, weight has not fluctuated significantly, no active heart failure symptoms on medical therapy. Continue current regimen.  Paroxysmal atrial fibrillation Symptomatic and well controlled. Continue beta blocker and Coumadin.  CAD, NATIVE  VESSEL No active angina symptoms status post DES to RCA in 2014.    Jonelle Sidle, M.D., F.A.C.C.

## 2014-10-24 NOTE — Assessment & Plan Note (Signed)
No active angina symptoms status post DES to RCA in 2014.

## 2014-10-24 NOTE — Patient Instructions (Signed)

## 2014-10-24 NOTE — Assessment & Plan Note (Signed)
Symptomatically stable without active angina, weight has not fluctuated significantly, no active heart failure symptoms on medical therapy. Continue current regimen.

## 2014-10-24 NOTE — Assessment & Plan Note (Signed)
Symptomatic and well controlled. Continue beta blocker and Coumadin.

## 2014-10-25 ENCOUNTER — Encounter (HOSPITAL_COMMUNITY): Payer: Self-pay | Admitting: Cardiovascular Disease

## 2014-12-10 ENCOUNTER — Telehealth: Payer: Self-pay | Admitting: Cardiology

## 2014-12-10 ENCOUNTER — Encounter: Payer: Medicare Other | Admitting: *Deleted

## 2014-12-10 NOTE — Telephone Encounter (Signed)
Spoke with pt and reminded pt of remote transmission that is due today. Pt verbalized understanding.   

## 2014-12-11 ENCOUNTER — Encounter: Payer: Self-pay | Admitting: Cardiology

## 2015-01-17 ENCOUNTER — Encounter: Payer: Self-pay | Admitting: Family

## 2015-01-17 ENCOUNTER — Ambulatory Visit: Payer: Medicare Other | Admitting: Family

## 2015-01-17 ENCOUNTER — Encounter (HOSPITAL_COMMUNITY): Payer: Medicare Other

## 2015-01-18 ENCOUNTER — Encounter: Payer: Self-pay | Admitting: Family

## 2015-01-18 ENCOUNTER — Ambulatory Visit (INDEPENDENT_AMBULATORY_CARE_PROVIDER_SITE_OTHER): Payer: Medicare Other | Admitting: Family

## 2015-01-18 ENCOUNTER — Ambulatory Visit (HOSPITAL_COMMUNITY)
Admission: RE | Admit: 2015-01-18 | Discharge: 2015-01-18 | Disposition: A | Discharge status: Home / Self Care | Payer: Medicare Other | Source: Ambulatory Visit | Attending: Family | Admitting: Family

## 2015-01-18 VITALS — BP 141/71 | HR 61 | Resp 16 | Ht 68.0 in | Wt 255.0 lb

## 2015-01-18 DIAGNOSIS — Z87891 Personal history of nicotine dependence: Secondary | ICD-10-CM | POA: Diagnosis not present

## 2015-01-18 DIAGNOSIS — E1159 Type 2 diabetes mellitus with other circulatory complications: Secondary | ICD-10-CM | POA: Diagnosis not present

## 2015-01-18 DIAGNOSIS — E669 Obesity, unspecified: Secondary | ICD-10-CM | POA: Diagnosis not present

## 2015-01-18 DIAGNOSIS — E1151 Type 2 diabetes mellitus with diabetic peripheral angiopathy without gangrene: Secondary | ICD-10-CM

## 2015-01-18 DIAGNOSIS — Z8679 Personal history of other diseases of the circulatory system: Secondary | ICD-10-CM

## 2015-01-18 DIAGNOSIS — I739 Peripheral vascular disease, unspecified: Secondary | ICD-10-CM | POA: Insufficient documentation

## 2015-01-18 NOTE — Progress Notes (Signed)
[facility] [place] HISTORY AND PHYSICAL -PAD  History of Present Illness [name] is a 74 y.o. male patient that Dr. Darrick Penna saw in August 2015 with intermittent LE claudication. At that time bilateral ABI dated 06/25/14: Right 0.9, left 0.8, duplex suggestive of tibial disease right, left side SFA and tibial disease. In August 2015 his left lower extremity claudication was not really disabling to him; about December 2015 the claudication stopped which he attributes to new shoes.  Angiogram would be higher risk with his renal dysfunction. He denies lower extremity non healing wounds. Pt states his great toenails fall off and regrow every 3 months.  He had TIA's before atrial fib was diagnosed, he is taking coumadin (since atrial fib diagnosed) and Plavix (since his defibrillator), he denies TIA's since he has been taking coumadin.   The patient denies New Medical or Surgical History.  Pt Diabetic: Yes, states his last A1C was 7.4 Pt smoker: former smoker, quit in the 1960's, smoked for 4-5 years  Pt meds include: Statin :No, caused myalgias Betablocker: Yes ASA: No Other anticoagulants/antiplatelets: Plavix, coumadin  Past Medical History  Diagnosis Date  . Type 2 diabetes mellitus   . Coronary atherosclerosis of native coronary artery     a. DES to RCA 03/2009. b. s/p PTCA to RCA for ISR, 05/2010. c. 03/2013 NSTEMI 2/2 severe prox RCA s/p PTCA/DES, med rx for residual dz.  . Hyperlipidemia   . TIA (transient ischemic attack)     Multiple TIAs  . Cardiomyopathy, ischemic     LVEF 25-35%.  . Morbid obesity   . BPH (benign prostatic hypertrophy)   . LBBB (left bundle branch block)     s/p BiV ICD Implanted by Dr Graciela Husbands  . Paroxysmal atrial fibrillation     a. Coumadin discontinue in 2010 when the patient required ASA/Plavix for stenting. b. Coumadin restarted 05/2013, Plavix continued, ASA stopped.   . Chronic kidney disease, stage III (moderate)    Followed by Dr. Fausto Skillern.  . Essential hypertension, benign   . Anemia-chronic     a. Pt reports h/o anemia - was offered IV iron in past by nephrologist but declined and has taken PO instead.  . Aneurysm, cerebral   . MRSA (methicillin resistant Staphylococcus aureus)   . Anemia   . Pulmonary nodule     CXR, 03/2013, [facility] - not described on any subsequent chest x-rays however, could be a vascular structure  . Myocardial infarction     Social History History  Substance Use Topics  . Smoking status: Former Smoker -- 1.00 packs/day for 6 years    Types: Cigarettes    Start date: 12/08/1949    Quit date: 11/16/1964  . Smokeless tobacco: Former Neurosurgeon    Quit date: 04/15/1965  . Alcohol Use: Yes     Comment: once/month    Family History Family History  Problem Relation Age of Onset  . Heart disease Other     family h/o premature cardiovascular disease  . Diabetes Mother     Bilateral leg amputation  . Heart disease Mother     Before age 71  . Hypertension Mother   . Cancer Father     Prostate and Bone  . Diabetes Father   . Cancer Sister     Colon   . Hypertension Sister   . Cancer Brother     Prostate    Past Surgical History  Procedure Laterality Date  . Cholecystectomy    .  Total knee arthroplasty    . Vasectomy    . Lung surgery      23 - 24 yrs. old  . Icd placement  May 06, 2011    Medtronic Protecta XT CRT-D  . Left heart catheterization with coronary angiogram N/A 04/17/2013    Procedure: LEFT HEART CATHETERIZATION WITH CORONARY ANGIOGRAM;  Surgeon: Vesta Mixer, MD;  Location: [facility] CATH LAB;  Service: Cardiovascular;  Laterality: N/A;    Allergies  Allergen Reactions  . Codeine Other (See Comments)    REACTION: blisters, but able to take hydrocodone    Current Outpatient Prescriptions  Medication Sig Dispense Refill  . acetaminophen (TYLENOL) 650 MG CR tablet Take 650 mg by mouth 2 (two) times daily.    . carvedilol (COREG) 12.5 MG tablet Take 1.5  tablets (18.75 mg total) by mouth 2 (two) times daily with a meal. 90 tablet 6  . cholecalciferol (VITAMIN D) 1000 UNITS tablet Take 1,000 Units by mouth daily.    . clopidogrel (PLAVIX) 75 MG tablet Take 1 tablet (75 mg total) by mouth daily. 30 tablet 6  . hydrALAZINE (APRESOLINE) 25 MG tablet Take 1 tablet (25 mg total) by mouth 3 (three) times daily. 90 tablet 6  . HYDROcodone-acetaminophen (NORCO) 10-325 MG per tablet Take 1 tablet by mouth every 8 (eight) hours as needed for pain.     Marland Kitchen insulin lispro protamine-insulin lispro (HUMALOG 75/25) (75-25) 100 UNIT/ML SUSP Inject 15-25 Units into the skin 3 (three) times daily after meals. Will inject between 15 and 25 units each time blood sugar level is checked.    . isosorbide mononitrate (IMDUR) 30 MG 24 hr tablet Take 1 tablet (30 mg total) by mouth daily. 30 tablet 6  . l-methylfolate-B6-B12 (METANX) 3-35-2 MG TABS Take 1 tablet by mouth daily. 60 tablet 6  . levothyroxine (SYNTHROID, LEVOTHROID) 25 MCG tablet Take 1 tablet by mouth daily.    Marland Kitchen linagliptin (TRADJENTA) 5 MG TABS tablet Take 5 mg by mouth daily.    Marland Kitchen lisinopril (PRINIVIL,ZESTRIL) 5 MG tablet Take 2.5 mg by mouth daily.     . nitroGLYCERIN (NITROSTAT) 0.4 MG SL tablet Place 0.4 mg under the tongue every 5 (five) minutes as needed for chest pain. For chest pain    . potassium chloride (K-DUR) 10 MEQ tablet Take 0.5 tablets (5 mEq total) by mouth daily. 45 tablet 3  . tamsulosin (FLOMAX) 0.4 MG CAPS capsule Take 1 capsule by mouth daily as needed.    . torsemide (DEMADEX) 10 MG tablet Take 1 tablet (10 mg total) by mouth daily. 30 tablet 6  . warfarin (COUMADIN) 5 MG tablet Take 2.5-5 mg by mouth daily at 6 PM. Takes 2.5mg  on mon only Takes 5mg  all other days    . Zinc 50 MG CAPS Take 50 mg by mouth daily.      No current facility-administered medications for this visit.    ROS: See HPI for pertinent positives and negatives.   Physical Examination  Filed Vitals:    01/18/15 1216  BP: 141/71  Pulse: 61  Resp: 16  Height: 5\' 8"  (1.727 m)  Weight: 255 lb (115.667 kg)  SpO2: 96%   Body mass index is 38.78 kg/(m^2).  General: A&O x 3, WDWN, obese male. Gait: normal Eyes: PERRLA. Pulmonary: CTAB, without wheezes , rales or rhonchi. Cardiac: regular Rythm , without detected murmur.         Carotid Bruits Right Left   Negative Negative  Aorta is  not palpable. Radial pulses: are 1+ palpable and =                           VASCULAR EXAM: Extremities without ischemic changes, without Gangrene; without open wounds. Right great toenail absent, left is growing back.                                                                                                          LE Pulses Right Left       FEMORAL  not palpable  not palpable        POPLITEAL  not palpable   not palpable       POSTERIOR TIBIAL  not palpable   noot palpable        DORSALIS PEDIS      ANTERIOR TIBIAL not palpable  not palpable    Abdomen: soft, NT, no palpable masses. Skin: no rashes, no ulcers. Musculoskeletal: no muscle wasting or atrophy.  Neurologic: A&O X 3; Appropriate Affect; MOTOR FUNCTION:  moving all extremities equally, motor strength 5/5 throughout. Speech is fluent/normal. CN 2-12 intact.    Non-Invasive Vascular Imaging: DATE: 01/18/2015 ABI's are not reliable due to non compressible vessels suggesting medial calcification.  All waveforms are monophasic. Toe pressures: right: 47, left: 37   ASSESSMENT: [name] is a 74 y.o. male with a history of resolved intermittent lower extremity claudication in the background of almost controlled Type 2 DM and former smoking status. ABI's are not reliable due to non compressible vessels suggesting medial calcification.  All waveforms are monophasic. Toe pressures are below normal. He has no tissue loss.     PLAN:  Graduated walking program. I discussed in depth with the patient the nature of  atherosclerosis, and emphasized the importance of maximal medical management including strict control of blood pressure, blood glucose, and lipid levels, obtaining regular exercise, and continued cessation of smoking.  The patient is aware that without maximal medical management the underlying atherosclerotic disease process will progress, limiting the benefit of any interventions.  Based on the patient's vascular studies and examination, pt will return to clinic in 1 year with ABI's.   The patient was given information about PAD including signs, symptoms, treatment, what symptoms should prompt the patient to seek immediate medical care, and risk reduction measures to take.  Charisse March, RN, MSN, FNP-C [facility] Phone: [phone]  Clinic MD: Imogene Burn  01/18/2015 12:44 PM

## 2015-01-18 NOTE — Patient Instructions (Signed)

## 2015-02-04 ENCOUNTER — Other Ambulatory Visit: Payer: Self-pay | Admitting: *Deleted

## 2015-02-04 MED ORDER — ISOSORBIDE MONONITRATE ER 30 MG PO TB24: 30.0000 mg | ORAL_TABLET | Freq: Every day | ORAL | Status: DC

## 2015-03-04 ENCOUNTER — Other Ambulatory Visit: Payer: Self-pay | Admitting: *Deleted

## 2015-03-04 MED ORDER — TORSEMIDE 10 MG PO TABS
10.0000 mg | ORAL_TABLET | Freq: Every day | ORAL | Status: DC
Start: 2015-03-04 — End: 2015-06-19

## 2015-03-11 ENCOUNTER — Other Ambulatory Visit: Payer: Self-pay | Admitting: *Deleted

## 2015-03-11 MED ORDER — HYDRALAZINE HCL 25 MG PO TABS: 25.0000 mg | ORAL_TABLET | Freq: Three times a day (TID) | ORAL | Status: DC

## 2015-03-11 NOTE — Telephone Encounter (Signed)
Hydralazine refilled today.

## 2015-04-08 ENCOUNTER — Other Ambulatory Visit: Payer: Self-pay | Admitting: *Deleted

## 2015-04-08 MED ORDER — HYDRALAZINE HCL 25 MG PO TABS: 25.0000 mg | ORAL_TABLET | Freq: Three times a day (TID) | ORAL | Status: DC

## 2015-04-08 MED ORDER — CARVEDILOL 12.5 MG PO TABS: 18.7500 mg | ORAL_TABLET | Freq: Two times a day (BID) | ORAL | Status: DC

## 2015-04-08 NOTE — Telephone Encounter (Signed)
Refill done today for Hydralazine & Coreg.  Due for 6 month in June.

## 2015-05-02 ENCOUNTER — Other Ambulatory Visit: Payer: Self-pay | Admitting: Cardiology

## 2015-05-02 MED ORDER — L-METHYLFOLATE-B6-B12 3-35-2 MG PO TABS: 1.0000 | ORAL_TABLET | Freq: Every day | ORAL | Status: DC

## 2015-05-02 NOTE — Telephone Encounter (Signed)
l-methylfolate-B6-B12 (METANX) 3-35-2 MG TABS needs refill

## 2015-05-03 ENCOUNTER — Encounter (HOSPITAL_COMMUNITY): Payer: Self-pay

## 2015-05-03 ENCOUNTER — Emergency Department (HOSPITAL_COMMUNITY): Payer: Medicare Other

## 2015-05-03 ENCOUNTER — Emergency Department (HOSPITAL_COMMUNITY)
Admission: EM | Admit: 2015-05-03 | Discharge: 2015-05-03 | Disposition: A | Discharge status: Home / Self Care | Payer: Medicare Other | Attending: Emergency Medicine | Admitting: Emergency Medicine

## 2015-05-03 DIAGNOSIS — Z7902 Long term (current) use of antithrombotics/antiplatelets: Secondary | ICD-10-CM | POA: Insufficient documentation

## 2015-05-03 DIAGNOSIS — I129 Hypertensive chronic kidney disease with stage 1 through stage 4 chronic kidney disease, or unspecified chronic kidney disease: Secondary | ICD-10-CM | POA: Insufficient documentation

## 2015-05-03 DIAGNOSIS — Z862 Personal history of diseases of the blood and blood-forming organs and certain disorders involving the immune mechanism: Secondary | ICD-10-CM | POA: Insufficient documentation

## 2015-05-03 DIAGNOSIS — I252 Old myocardial infarction: Secondary | ICD-10-CM | POA: Insufficient documentation

## 2015-05-03 DIAGNOSIS — Z8614 Personal history of Methicillin resistant Staphylococcus aureus infection: Secondary | ICD-10-CM | POA: Diagnosis not present

## 2015-05-03 DIAGNOSIS — Z7901 Long term (current) use of anticoagulants: Secondary | ICD-10-CM | POA: Diagnosis not present

## 2015-05-03 DIAGNOSIS — M25531 Pain in right wrist: Secondary | ICD-10-CM

## 2015-05-03 DIAGNOSIS — Z79899 Other long term (current) drug therapy: Secondary | ICD-10-CM | POA: Insufficient documentation

## 2015-05-03 DIAGNOSIS — E119 Type 2 diabetes mellitus without complications: Secondary | ICD-10-CM | POA: Insufficient documentation

## 2015-05-03 DIAGNOSIS — Z794 Long term (current) use of insulin: Secondary | ICD-10-CM | POA: Diagnosis not present

## 2015-05-03 DIAGNOSIS — I480 Paroxysmal atrial fibrillation: Secondary | ICD-10-CM | POA: Diagnosis not present

## 2015-05-03 DIAGNOSIS — E785 Hyperlipidemia, unspecified: Secondary | ICD-10-CM | POA: Insufficient documentation

## 2015-05-03 DIAGNOSIS — Z8673 Personal history of transient ischemic attack (TIA), and cerebral infarction without residual deficits: Secondary | ICD-10-CM | POA: Diagnosis not present

## 2015-05-03 DIAGNOSIS — N183 Chronic kidney disease, stage 3 (moderate): Secondary | ICD-10-CM | POA: Insufficient documentation

## 2015-05-03 DIAGNOSIS — Z87891 Personal history of nicotine dependence: Secondary | ICD-10-CM | POA: Diagnosis not present

## 2015-05-03 DIAGNOSIS — I2510 Atherosclerotic heart disease of native coronary artery without angina pectoris: Secondary | ICD-10-CM | POA: Insufficient documentation

## 2015-05-03 DIAGNOSIS — N400 Enlarged prostate without lower urinary tract symptoms: Secondary | ICD-10-CM | POA: Diagnosis not present

## 2015-05-03 MED ORDER — ACETAMINOPHEN 325 MG PO TABS
650.0000 mg | ORAL_TABLET | Freq: Once | ORAL | Status: AC
  Administered 2015-05-03: 650 mg via ORAL
  Filled 2015-05-03: qty 2

## 2015-05-03 MED ORDER — ACETAMINOPHEN 500 MG PO TABS: 500.0000 mg | ORAL_TABLET | Freq: Four times a day (QID) | ORAL | Status: DC | PRN

## 2015-05-03 NOTE — ED Notes (Signed)
Pt reports tripping on Saturday. Reports right wrist pain with swelling. Pt's pulses/sensation intact. NAD.

## 2015-05-03 NOTE — ED Provider Notes (Signed)
CSN: [id number]     Arrival date & time 05/03/15  1141 History   This chart was scribed for Joycie Peek, PA-C working with Rolland Porter, MD by Elveria Rising, ED Scribe. This patient was seen in room TR07C/TR07C and the patient's care was started at 1:57 PM.   Chief Complaint  Patient presents with  . Wrist Pain   HPI HPI Comments: [name] is a 74 y.o. male with extensive PMHx including chronic kidney disease who presents to the Emergency Department with right wrist injury after falling six days ago. Patient reports evaluation at [facility] the following Monday, two days later, for his right shoulder which he states took the brunt of the impact. Patient denies right hand wrist pain at the time, but reports later development of dorsal wrist pain, swelling and decreased range of motion due to pain severity. Patient has scheduled an appointment for evaluation of his wrist at [facility] Monday, in three days, but states that the he couldn't wait until then to have it check out. Patient shares previous injury to wrist "a couple years ago." Denies numbness, weakness, fevers, chills. No other aggravating or modifying factors.  Past Medical History  Diagnosis Date  . Type 2 diabetes mellitus   . Coronary atherosclerosis of native coronary artery     a. DES to RCA 03/2009. b. s/p PTCA to RCA for ISR, 05/2010. c. 03/2013 NSTEMI 2/2 severe prox RCA s/p PTCA/DES, med rx for residual dz.  . Hyperlipidemia   . TIA (transient ischemic attack)     Multiple TIAs  . Cardiomyopathy, ischemic     LVEF 25-35%.  . Morbid obesity   . BPH (benign prostatic hypertrophy)   . LBBB (left bundle branch block)     s/p BiV ICD Implanted by Dr Graciela Husbands  . Paroxysmal atrial fibrillation     a. Coumadin discontinue in 2010 when the patient required ASA/Plavix for stenting. b. Coumadin restarted 05/2013, Plavix continued, ASA stopped.   . Chronic kidney disease, stage III (moderate)     Followed by  Dr. Fausto Skillern.  . Essential hypertension, benign   . Anemia-chronic     a. Pt reports h/o anemia - was offered IV iron in past by nephrologist but declined and has taken PO instead.  . Aneurysm, cerebral   . MRSA (methicillin resistant Staphylococcus aureus)   . Anemia   . Pulmonary nodule     CXR, 03/2013, [facility] - not described on any subsequent chest x-rays however, could be a vascular structure  . Myocardial infarction    Past Surgical History  Procedure Laterality Date  . Cholecystectomy    . Total knee arthroplasty    . Vasectomy    . Lung surgery      23 - 24 yrs. old  . Icd placement  May 06, 2011    Medtronic Protecta XT CRT-D  . Left heart catheterization with coronary angiogram N/A 04/17/2013    Procedure: LEFT HEART CATHETERIZATION WITH CORONARY ANGIOGRAM;  Surgeon: Vesta Mixer, MD;  Location: [facility] CATH LAB;  Service: Cardiovascular;  Laterality: N/A;   Family History  Problem Relation Age of Onset  . Heart disease Other     family h/o premature cardiovascular disease  . Diabetes Mother     Bilateral leg amputation  . Heart disease Mother     Before age 66  . Hypertension Mother   . Cancer Father     Prostate and Bone  . Diabetes Father   .  Cancer Sister     Colon   . Hypertension Sister   . Cancer Brother     Prostate   History  Substance Use Topics  . Smoking status: Former Smoker -- 1.00 packs/day for 6 years    Types: Cigarettes    Start date: 12/08/1949    Quit date: 11/16/1964  . Smokeless tobacco: Former Neurosurgeon    Quit date: 04/15/1965  . Alcohol Use: Yes     Comment: once/month    Review of Systems  Constitutional: Negative for fever.  Musculoskeletal: Positive for joint swelling and arthralgias.  Neurological: Negative for weakness and numbness.  All other systems reviewed and are negative.     Allergies  Codeine  Home Medications   Prior to Admission medications   Medication Sig Start Date End Date Taking? Authorizing Provider   acetaminophen (TYLENOL) 500 MG tablet Take 1 tablet (500 mg total) by mouth every 6 (six) hours as needed. 05/03/15   Joycie Peek, PA-C  carvedilol (COREG) 12.5 MG tablet Take 1.5 tablets (18.75 mg total) by mouth 2 (two) times daily with a meal. 04/08/15   Jonelle Sidle, MD  cholecalciferol (VITAMIN D) 1000 UNITS tablet Take 1,000 Units by mouth daily.    Historical Provider, MD  clopidogrel (PLAVIX) 75 MG tablet Take 1 tablet (75 mg total) by mouth daily. 06/06/14   Jonelle Sidle, MD  hydrALAZINE (APRESOLINE) 25 MG tablet Take 1 tablet (25 mg total) by mouth 3 (three) times daily. 04/08/15   Jonelle Sidle, MD  HYDROcodone-acetaminophen (NORCO) 10-325 MG per tablet Take 1 tablet by mouth every 8 (eight) hours as needed for pain.  05/15/13   Historical Provider, MD  insulin lispro protamine-insulin lispro (HUMALOG 75/25) (75-25) 100 UNIT/ML SUSP Inject 15-25 Units into the skin 3 (three) times daily after meals. Will inject between 15 and 25 units each time blood sugar level is checked.    Historical Provider, MD  isosorbide mononitrate (IMDUR) 30 MG 24 hr tablet Take 1 tablet (30 mg total) by mouth daily. 02/04/15   Jonelle Sidle, MD  l-methylfolate-B6-B12 (METANX) 3-35-2 MG TABS Take 1 tablet by mouth daily. 05/02/15   Jonelle Sidle, MD  levothyroxine (SYNTHROID, LEVOTHROID) 25 MCG tablet Take 1 tablet by mouth daily. 08/13/14   Historical Provider, MD  linagliptin (TRADJENTA) 5 MG TABS tablet Take 5 mg by mouth daily.    Historical Provider, MD  lisinopril (PRINIVIL,ZESTRIL) 5 MG tablet Take 2.5 mg by mouth daily.  09/18/13   Historical Provider, MD  nitroGLYCERIN (NITROSTAT) 0.4 MG SL tablet Place 0.4 mg under the tongue every 5 (five) minutes as needed for chest pain. For chest pain 04/26/13   Rande Brunt, PA-C  potassium chloride (K-DUR) 10 MEQ tablet Take 0.5 tablets (5 mEq total) by mouth daily. 07/10/14   Jonelle Sidle, MD  tamsulosin (FLOMAX) 0.4 MG CAPS capsule Take 1  capsule by mouth daily as needed. 08/01/14   Historical Provider, MD  torsemide (DEMADEX) 10 MG tablet Take 1 tablet (10 mg total) by mouth daily. 03/04/15   Laqueta Linden, MD  warfarin (COUMADIN) 5 MG tablet Take 2.5-5 mg by mouth daily at 6 PM. Takes 2.5mg  on mon only Takes 5mg  all other days    Historical Provider, MD  Zinc 50 MG CAPS Take 50 mg by mouth daily.     Historical Provider, MD   Triage Vitals: BP 148/58 mmHg  Pulse 71  Temp(Src) 98.4 F (36.9 C) (Oral)  SpO2 95% Physical Exam  Constitutional: He is oriented to person, place, and time. He appears well-developed and well-nourished. No distress.  HENT:  Head: Normocephalic and atraumatic.  Eyes: EOM are normal.  Neck: Neck supple. No tracheal deviation present.  Cardiovascular: Normal rate.   Pulmonary/Chest: Effort normal. No respiratory distress.  Musculoskeletal: Normal range of motion. He exhibits tenderness.  Diffuse tenderness to right wrist with no focal bony tenderness. Maintains Full ROM. Distal pulses intact with brisk cap refill. NVI.   Neurological: He is alert and oriented to person, place, and time.  Skin: Skin is warm and dry.  Psychiatric: He has a normal mood and affect. His behavior is normal.  Nursing note and vitals reviewed.   ED Course  Procedures (including critical care time)  COORDINATION OF CARE: 2:06 PM- Will apply splint given negative imaging. Discussed treatment plan with patient at bedside and patient agreed to plan.   Labs Review Labs Reviewed - No data to display  Imaging Review Dg Wrist Complete Right  05/03/2015   CLINICAL DATA:  Fall 7 days ago.  EXAM: RIGHT WRIST - COMPLETE 3+ VIEW  COMPARISON:  None.  FINDINGS: The joint space narrowing at the radiocarpal joint. There is calcification in the triangular fibrocartilage. No evidence of acute fracture dislocation of the radius or carpal bones. Extensive vascular atherosclerotic calcification.  IMPRESSION: 1. Degenerative changes  of the wrist. 2. No acute findings.   Electronically Signed   By: Genevive Bi M.D.   On: 05/03/2015 12:52     EKG Interpretation None     SPLINT APPLICATION Date/Time: 2:32 PM Authorized by: Sharlene Motts Consent: Verbal consent obtained. Risks and benefits: risks, benefits and alternatives were discussed Consent given by: patient Splint applied by: orthopedic technician Location details: R Wrist Splint type: cock-up  Supplies used: velcro brace Post-procedure: The splinted body part was neurovascularly unchanged following the procedure. Patient tolerance: Patient tolerated the procedure well with no immediate complications.   Meds given in ED:  Medications  acetaminophen (TYLENOL) tablet 650 mg (650 mg Oral Given 05/03/15 1412)    New Prescriptions   ACETAMINOPHEN (TYLENOL) 500 MG TABLET    Take 1 tablet (500 mg total) by mouth every 6 (six) hours as needed.   Filed Vitals:   05/03/15 1216 05/03/15 1418  BP: 148/58 127/60  Pulse: 71 64  Temp: 98.4 F (36.9 C) 98.4 F (36.9 C)  TempSrc: Oral Oral  Resp:  16  SpO2: 95% 98%    MDM  Vitals stable - WNL -afebrile Pt resting comfortably in ED. PE--patient neurovascularly intact and maintains full range of motion of wrist with some moderate swelling noted. Imaging--x-rays of right wrist are negative for any acute fracture.  DDX--given patient pain, swelling, we'll treat for radial occult fracture. Patient placed in a cockup wrist splint. We'll begin to follow-up with orthopedics on Monday for regularly scheduled appointment. Encouraged use of Tylenol and avoid NSAIDs due to patient's history of chronic kidney disease.  I discussed all relevant lab findings and imaging results with pt and they verbalized understanding. Discussed f/u with PCP within 48 hrs and return precautions, pt very amenable to plan.  Final diagnoses:  Right wrist pain    I personally performed the services described in this  documentation, which was scribed in my presence. The recorded information has been reviewed and is accurate.    Joycie Peek, PA-C 05/03/15 1434  Rolland Porter, MD 05/08/15 [phone]

## 2015-05-03 NOTE — Discharge Instructions (Signed)
You were evaluated in the ED today for your wrist pain. Your x-rays were negative for any acute fracture, however your put in a wrist splint. It is important he follow-up with orthopedics for regularly scheduled appointment on Monday. Return to ED for worsening symptoms. You may continue to take Tylenol as needed for discomfort.  Arthralgia Your caregiver has diagnosed you as suffering from an arthralgia. Arthralgia means there is pain in a joint. This can come from many reasons including:  Bruising the joint which causes soreness (inflammation) in the joint.  Wear and tear on the joints which occur as we grow older (osteoarthritis).  Overusing the joint.  Various forms of arthritis.  Infections of the joint. Regardless of the cause of pain in your joint, most of these different pains respond to anti-inflammatory drugs and rest. The exception to this is when a joint is infected, and these cases are treated with antibiotics, if it is a bacterial infection. HOME CARE INSTRUCTIONS   Rest the injured area for as long as directed by your caregiver. Then slowly start using the joint as directed by your caregiver and as the pain allows. Crutches as directed may be useful if the ankles, knees or hips are involved. If the knee was splinted or casted, continue use and care as directed. If an stretchy or elastic wrapping bandage has been applied today, it should be removed and re-applied every 3 to 4 hours. It should not be applied tightly, but firmly enough to keep swelling down. Watch toes and feet for swelling, bluish discoloration, coldness, numbness or excessive pain. If any of these problems (symptoms) occur, remove the ace bandage and re-apply more loosely. If these symptoms persist, contact your caregiver or return to this location.  For the first 24 hours, keep the injured extremity elevated on pillows while lying down.  Apply ice for 15-20 minutes to the sore joint every couple hours while awake  for the first half day. Then 03-04 times per day for the first 48 hours. Put the ice in a plastic bag and place a towel between the bag of ice and your skin.  Wear any splinting, casting, elastic bandage applications, or slings as instructed.  Only take over-the-counter or prescription medicines for pain, discomfort, or fever as directed by your caregiver. Do not use aspirin immediately after the injury unless instructed by your physician. Aspirin can cause increased bleeding and bruising of the tissues.  If you were given crutches, continue to use them as instructed and do not resume weight bearing on the sore joint until instructed. Persistent pain and inability to use the sore joint as directed for more than 2 to 3 days are warning signs indicating that you should see a caregiver for a follow-up visit as soon as possible. Initially, a hairline fracture (break in bone) may not be evident on X-rays. Persistent pain and swelling indicate that further evaluation, non-weight bearing or use of the joint (use of crutches or slings as instructed), or further X-rays are indicated. X-rays may sometimes not show a small fracture until a week or 10 days later. Make a follow-up appointment with your own caregiver or one to whom we have referred you. A radiologist (specialist in reading X-rays) may read your X-rays. Make sure you know how you are to obtain your X-ray results. Do not assume everything is normal if you do not hear from [place]. SEEK MEDICAL CARE IF: Bruising, swelling, or pain increases. SEEK IMMEDIATE MEDICAL CARE IF:   Your  fingers or toes are numb or blue.  The pain is not responding to medications and continues to stay the same or get worse.  The pain in your joint becomes severe.  You develop a fever over 102 F (38.9 C).  It becomes impossible to move or use the joint. MAKE SURE YOU:   Understand these instructions.  Will watch your condition.  Will get help right away if you are not  doing well or get worse. Document Released: 11/02/2005 Document Revised: 01/25/2012 Document Reviewed: 06/20/2008 [facility] Patient Information 2015 [place], [place]. This information is not intended to replace advice given to you by your health care provider. Make sure you discuss any questions you have with your health care provider.  Wrist Pain Wrist injuries are frequent in adults and children. A sprain is an injury to the ligaments that hold your bones together. A strain is an injury to muscle or muscle cord-like structures (tendons) from stretching or pulling. Generally, when wrists are moderately tender to touch following a fall or injury, a break in the bone (fracture) may be present. Most wrist sprains or strains are better in 3 to 5 days, but complete healing may take several weeks. HOME CARE INSTRUCTIONS   Put ice on the injured area.  Put ice in a plastic bag.  Place a towel between your skin and the bag.  Leave the ice on for 15-20 minutes, 3-4 times a day, for the first 2 days, or as directed by your health care provider.  Keep your arm raised above the level of your heart whenever possible to reduce swelling and pain.  Rest the injured area for at least 48 hours or as directed by your health care provider.  If a splint or elastic bandage has been applied, use it for as long as directed by your health care provider or until seen by a health care provider for a follow-up exam.  Only take over-the-counter or prescription medicines for pain, discomfort, or fever as directed by your health care provider.  Keep all follow-up appointments. You may need to follow up with a specialist or have follow-up X-rays. Improvement in pain level is not a guarantee that you did not fracture a bone in your wrist. The only way to determine whether or not you have a broken bone is by X-ray. SEEK IMMEDIATE MEDICAL CARE IF:   Your fingers are swollen, very red, white, or cold and blue.  Your fingers  are numb or tingling.  You have increasing pain.  You have difficulty moving your fingers. MAKE SURE YOU:   Understand these instructions.  Will watch your condition.  Will get help right away if you are not doing well or get worse. Document Released: 08/12/2005 Document Revised: 11/07/2013 Document Reviewed: 12/24/2010 [facility] Patient Information 2015 [place], [place]. This information is not intended to replace advice given to you by your health care provider. Make sure you discuss any questions you have with your health care provider.

## 2015-05-03 NOTE — ED Notes (Signed)
Called ortho for large right wrist spint.

## 2015-06-06 ENCOUNTER — Other Ambulatory Visit: Payer: Self-pay

## 2015-06-06 NOTE — Telephone Encounter (Signed)
Primary needs to fill this medication not cardiac, note was sent to pharmacy

## 2015-06-06 NOTE — Telephone Encounter (Signed)
l-methylfolate-B6-B12 (METANX) 3-35-2 MG TABS Needed  To be sent to [facility] in [facility] Patient is scheduled to see Dr Diona Browner in Aug 2016

## 2015-06-10 ENCOUNTER — Other Ambulatory Visit: Payer: Self-pay | Admitting: Cardiology

## 2015-06-10 NOTE — Telephone Encounter (Signed)
Medication is not cardiac related, this needs to be filled by pcp, LM for pt on VM

## 2015-06-10 NOTE — Telephone Encounter (Signed)
Patient needs refill on cholecalciferol (VITAMIN D) 1000 UNITS tablet  Please call [facility] in [place], [place] States he has been out a week. Patient has made follow up appointment with [facility].

## 2015-06-12 ENCOUNTER — Other Ambulatory Visit: Payer: Self-pay | Admitting: Cardiology

## 2015-06-19 ENCOUNTER — Encounter: Payer: Self-pay | Admitting: Cardiology

## 2015-06-19 ENCOUNTER — Ambulatory Visit (INDEPENDENT_AMBULATORY_CARE_PROVIDER_SITE_OTHER): Payer: Medicare Other | Admitting: Cardiology

## 2015-06-19 VITALS — BP 138/60 | HR 65 | Ht 68.0 in | Wt 257.0 lb

## 2015-06-19 DIAGNOSIS — I2510 Atherosclerotic heart disease of native coronary artery without angina pectoris: Secondary | ICD-10-CM | POA: Diagnosis not present

## 2015-06-19 DIAGNOSIS — Z95810 Presence of automatic (implantable) cardiac defibrillator: Secondary | ICD-10-CM

## 2015-06-19 DIAGNOSIS — I5022 Chronic systolic (congestive) heart failure: Secondary | ICD-10-CM | POA: Diagnosis not present

## 2015-06-19 DIAGNOSIS — I480 Paroxysmal atrial fibrillation: Secondary | ICD-10-CM

## 2015-06-19 DIAGNOSIS — I255 Ischemic cardiomyopathy: Secondary | ICD-10-CM

## 2015-06-19 MED ORDER — TORSEMIDE 10 MG PO TABS: 10.0000 mg | ORAL_TABLET | Freq: Every day | ORAL | Status: DC

## 2015-06-19 NOTE — Progress Notes (Signed)
Cardiology Office Note  Date: 06/19/2015   ID: [name], DOB [birth year], MRN [id number]  PCP: [name], MD  Primary Cardiologist: Nona Dell, MD   Chief Complaint  Patient presents with  . Coronary Artery Disease  . Cardiomyopathy  . PAF    History of Present Illness: [name] is a medically complex 74 y.o. male last seen in December 2015. He presents for a routine follow-up visit. From a cardiac perspective, he reports no angina symptoms, no device discharges. He continues to dance two nights a week, reports NYHA class II dyspnea with typical ADLs. He has had some trouble with right hand and shoulder discomfort following an accidental fall and was treated with steroids by his orthopedist.   Has been having some trouble with increasing leg edema, remains on very low-dose Demadex. His weight is up a few pounds from December.  Interval follow-up with [facility] noted in March. He is followed for lower extremity claudication.no progression in symptoms.  Past Medical History  Diagnosis Date  . Type 2 diabetes mellitus   . Coronary atherosclerosis of native coronary artery     a. DES to RCA 03/2009. b. s/p PTCA to RCA for ISR, 05/2010. c. 03/2013 NSTEMI 2/2 severe prox RCA s/p PTCA/DES, med rx for residual dz.  . Hyperlipidemia   . TIA (transient ischemic attack)     Multiple TIAs  . Cardiomyopathy, ischemic     LVEF 25-35%.  . Morbid obesity   . BPH (benign prostatic hypertrophy)   . LBBB (left bundle branch block)     s/p BiV ICD Implanted by Dr Graciela Husbands  . Paroxysmal atrial fibrillation     a. Coumadin discontinue in 2010 when the patient required ASA/Plavix for stenting. b. Coumadin restarted 05/2013, Plavix continued, ASA stopped.   . Chronic kidney disease, stage III (moderate)     Followed by Dr. Fausto Skillern.  . Essential hypertension, benign   . Anemia-chronic     a. Pt reports h/o anemia - was offered IV iron in past by nephrologist but declined and has taken PO  instead.  . Aneurysm, cerebral   . MRSA (methicillin resistant Staphylococcus aureus)   . Anemia   . Pulmonary nodule     CXR, 03/2013, [facility] - not described on any subsequent chest x-rays however, could be a vascular structure  . Myocardial infarction     Past Surgical History  Procedure Laterality Date  . Cholecystectomy    . Total knee arthroplasty    . Vasectomy    . Lung surgery      23 - 24 yrs. old  . Icd placement  May 06, 2011    Medtronic Protecta XT CRT-D  . Left heart catheterization with coronary angiogram N/A 04/17/2013    Procedure: LEFT HEART CATHETERIZATION WITH CORONARY ANGIOGRAM;  Surgeon: Vesta Mixer, MD;  Location: [facility] CATH LAB;  Service: Cardiovascular;  Laterality: N/A;    Current Outpatient Prescriptions  Medication Sig Dispense Refill  . acetaminophen (TYLENOL) 500 MG tablet Take 1 tablet (500 mg total) by mouth every 6 (six) hours as needed. 30 tablet 0  . carvedilol (COREG) 12.5 MG tablet Take 1.5 tablets (18.75 mg total) by mouth 2 (two) times daily with a meal. 270 tablet 0  . cholecalciferol (VITAMIN D) 1000 UNITS tablet Take 1,000 Units by mouth daily.    . clopidogrel (PLAVIX) 75 MG tablet Take 1 tablet (75 mg total) by mouth daily. 30 tablet 6  . hydrALAZINE (APRESOLINE) 25  MG tablet Take 1 tablet (25 mg total) by mouth 3 (three) times daily. 270 tablet 0  . HYDROcodone-acetaminophen (NORCO) 10-325 MG per tablet Take 1 tablet by mouth every 8 (eight) hours as needed for pain.     Marland Kitchen insulin lispro protamine-insulin lispro (HUMALOG 75/25) (75-25) 100 UNIT/ML SUSP Inject 15-25 Units into the skin 3 (three) times daily after meals. Will inject between 15 and 25 units each time blood sugar level is checked.    . isosorbide mononitrate (IMDUR) 30 MG 24 hr tablet Take 1 tablet (30 mg total) by mouth daily. 30 tablet 6  . L-Methylfolate-Algae-B12-B6 (METANX) 3-90.314-2-35 MG CAPS TAKE ONE CAPSULE BY MOUTH ONCE DAILY 60 capsule 3  . l-methylfolate-B6-B12  (METANX) 3-35-2 MG TABS Take 1 tablet by mouth daily. 30 tablet 0  . levothyroxine (SYNTHROID, LEVOTHROID) 25 MCG tablet Take 1 tablet by mouth daily.    Marland Kitchen linagliptin (TRADJENTA) 5 MG TABS tablet Take 5 mg by mouth daily.    Marland Kitchen lisinopril (PRINIVIL,ZESTRIL) 5 MG tablet Take 2.5 mg by mouth daily.     . nitroGLYCERIN (NITROSTAT) 0.4 MG SL tablet Place 0.4 mg under the tongue every 5 (five) minutes as needed for chest pain. For chest pain    . Polysaccharide Iron Complex (FERREX 150 PO) Take 150 mg by mouth.    . potassium chloride (K-DUR) 10 MEQ tablet Take 0.5 tablets (5 mEq total) by mouth daily. 45 tablet 3  . tamsulosin (FLOMAX) 0.4 MG CAPS capsule Take 1 capsule by mouth daily as needed.    . torsemide (DEMADEX) 10 MG tablet Take 1 tablet (10 mg total) by mouth daily. May take an extra pill as needed for leg swelling or weight gain of 2-3 lbs in a 24 hour period 45 tablet 3  . warfarin (COUMADIN) 5 MG tablet Take 2.5-5 mg by mouth daily at 6 PM. Takes 2.5mg  on mon only Takes 5mg  all other days    . Zinc 50 MG CAPS Take 50 mg by mouth daily.      No current facility-administered medications for this visit.    Allergies:  Codeine   Social History: The patient  reports that he quit smoking about 50 years ago. His smoking use included Cigarettes. He started smoking about 65 years ago. He has a 6 pack-year smoking history. He quit smokeless tobacco use about 50 years ago. He reports that he drinks alcohol. He reports that he does not use illicit drugs.   ROS:  Please see the history of present illness. Otherwise, complete review of systems is positive for mild increase in leg edema.  All other systems are reviewed and negative.   Physical Exam: VS:  BP 138/60 mmHg  Pulse 65  Ht 5\' 8"  (1.727 m)  Wt 257 lb (116.574 kg)  BMI 39.09 kg/m2  SpO2 97%, BMI Body mass index is 39.09 kg/(m^2).  Wt Readings from Last 3 Encounters:  06/19/15 257 lb (116.574 kg)  01/18/15 255 lb (115.667 kg)    10/24/14 254 lb (115.214 kg)     Appears comfortable.  HEENT: Conjunctiva and lids normal, oropharynx with moist mucosa.  Neck: Supple, no elevated JVP with increased girth.  Cardiac: Regular rate and rhythm, no S3.  Lungs: Clear, nonlabored. Abdomen: Obese, nontender, bowel sounds present.  Skin: Warm and dry.  Extremities: 1+ leg edema.  Musculoskeletal: No kyphosis.  Neuropsychiatric: Alert and oriented x3, affect appropriate.   ECG: ECG is not ordered today.  Other Studies Reviewed Today:  Echocardiogram  04/15/2013: Study Conclusions  - Left ventricle: The cavity size was normal. Wall thickness was increased in a pattern of moderate LVH. Systolic function was moderately reduced. The estimated ejection fraction was in the range of 35% to 40%. There was an increased relative contribution of atrial contraction to ventricular filling. Doppler parameters are consistent with abnormal left ventricular relaxation (grade 1 diastolic dysfunction). - Left atrium: The atrium was mildly dilated.   Assessment and Plan:  1. Chronic systolic heart failure. We discussed increasing Demadex to 20 mg daily as needed for worsening leg edema or weight gain of 2-3 pounds in 24 hours. Prescription modified. We will also obtain a follow-up echocardiogram to reassess cardiac structure and function compared to 2014.  2. Ischemic heart disease status post percutaneous interventions as outlined above, most recently DES to the RCA in 2014. He has been clinically stable on medical therapy.  3. Paroxysmal atrial fibrillation, continues on Coumadin, followed by Dr. Sherryll Burger.  4. Biventricular Medtronic ICD in place, followed by Dr. Johney Frame. No device shocks reported.  Current medicines were reviewed with the patient today.   Orders Placed This Encounter  Procedures  . ECHOCARDIOGRAM COMPLETE    Disposition: FU with me in 6 months.   Signed, Jonelle Sidle, MD,  [facility] 06/19/2015 1:39 PM    [facility] at [facility] [address] [place], [place], [place] [address] Phone: [phone]; Fax: [phone]

## 2015-06-19 NOTE — Patient Instructions (Addendum)
Your physician has recommended you make the following change in your medication:   You may take an extra torsemide pill as needed for leg swelling or weight gain of 2-3 lbs in a 24 hour period.  Continue all other medications the same. Your physician has requested that you have an echocardiogram. Echocardiography is a painless test that uses sound waves to create images of your heart. It provides your doctor with information about the size and shape of your heart and how well your heart's chambers and valves are working. This procedure takes approximately one hour. There are no restrictions for this procedure. Your physician recommends that you schedule a follow-up appointment in: 6 months. You will receive a reminder letter in the mail in about 4 months reminding you to call and schedule your appointment. If you don't receive this letter, please contact our office. A message was sent to the device clinic today to help you sort out your new phone number with your device equipment.

## 2015-06-20 ENCOUNTER — Telehealth: Payer: Self-pay | Admitting: Cardiology

## 2015-06-20 NOTE — Telephone Encounter (Signed)
LMOVM for pt to return call 

## 2015-06-20 NOTE — Telephone Encounter (Signed)
LMVOM w/ direct #.    ((did not know Britta Mccreedy had already left msg at time I called pt))

## 2015-06-20 NOTE — Telephone Encounter (Signed)
-----   Message from Eustace Moore, LPN sent at 12/21/8525  1:36 PM EDT ----- Regarding: Please contact this patient about his home monitor He informed Dr. Diona Browner today that he changed phone numbers and has a new internet carrier. The patient said he has a new piece to add to his home monitor system and isn't sure of what he needs to do.

## 2015-06-21 NOTE — Telephone Encounter (Signed)
Helped pt set wirex up. Transmission received. Pt aware.

## 2015-06-21 NOTE — Telephone Encounter (Signed)
F/U  Pt calling back to speak w/ Britta Mccreedy

## 2015-06-21 NOTE — Telephone Encounter (Signed)
Spoke w/ pt and he is going to call back in a few minutes.

## 2015-06-24 ENCOUNTER — Other Ambulatory Visit: Payer: Self-pay | Admitting: Cardiology

## 2015-07-06 ENCOUNTER — Other Ambulatory Visit: Payer: Self-pay | Admitting: Cardiology

## 2015-07-10 ENCOUNTER — Ambulatory Visit (INDEPENDENT_AMBULATORY_CARE_PROVIDER_SITE_OTHER): Payer: Medicare Other

## 2015-07-10 ENCOUNTER — Other Ambulatory Visit: Payer: Self-pay

## 2015-07-10 DIAGNOSIS — I255 Ischemic cardiomyopathy: Secondary | ICD-10-CM

## 2015-07-11 ENCOUNTER — Telehealth: Payer: Self-pay | Admitting: *Deleted

## 2015-07-11 NOTE — Telephone Encounter (Signed)
-----   Message from Jonelle Sidle, MD sent at 07/11/2015  8:25 AM EDT ----- Reviewed report. LVEF has improved compared to the previous study, still has evidence of diastolic dysfunction with increased filling pressures and therefore we need to keep on diuretic therapy as we discussed at the recent visit.

## 2015-07-11 NOTE — Telephone Encounter (Signed)
Patient informed. 

## 2015-08-23 ENCOUNTER — Encounter: Payer: Medicare Other | Admitting: Internal Medicine

## 2015-08-30 ENCOUNTER — Encounter: Payer: Medicare Other | Admitting: Internal Medicine

## 2015-09-02 ENCOUNTER — Other Ambulatory Visit: Payer: Self-pay | Admitting: Cardiology

## 2015-09-20 ENCOUNTER — Encounter: Payer: Self-pay | Admitting: Internal Medicine

## 2015-09-20 ENCOUNTER — Ambulatory Visit (INDEPENDENT_AMBULATORY_CARE_PROVIDER_SITE_OTHER): Payer: Medicare Other | Admitting: Internal Medicine

## 2015-09-20 VITALS — BP 108/58 | HR 52 | Ht 68.0 in | Wt 250.0 lb

## 2015-09-20 DIAGNOSIS — Z95810 Presence of automatic (implantable) cardiac defibrillator: Secondary | ICD-10-CM | POA: Diagnosis not present

## 2015-09-20 DIAGNOSIS — I5022 Chronic systolic (congestive) heart failure: Secondary | ICD-10-CM | POA: Diagnosis not present

## 2015-09-20 DIAGNOSIS — I255 Ischemic cardiomyopathy: Secondary | ICD-10-CM | POA: Diagnosis not present

## 2015-09-20 NOTE — Progress Notes (Signed)
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 He remains active.  He still goes dancing three nights per week.  He also mowes lawns.  Today, he denies symptoms of palpitations, chest pain, shortness of breath,  lower extremity edema, dizziness, presyncope, syncope, or ICD shocks.  The patient is otherwise without complaint today.  He says that his renal function is stable.   Past Medical History  Diagnosis Date  . Type 2 diabetes mellitus (HCC)   . Coronary atherosclerosis of native coronary artery     a. DES to RCA 03/2009. b. s/p PTCA to RCA for ISR, 05/2010. c. 03/2013 NSTEMI 2/2 severe prox RCA s/p PTCA/DES, med rx for residual dz.  . Hyperlipidemia   . TIA (transient ischemic attack)     Multiple TIAs  . Cardiomyopathy, ischemic     LVEF 25-35%.  . Morbid obesity (HCC)   . BPH (benign prostatic hypertrophy)   . LBBB (left bundle branch block)     s/p BiV ICD Implanted by Dr Graciela Husbands  . Paroxysmal atrial fibrillation (HCC)     a. Coumadin discontinue in 2010 when the patient required ASA/Plavix for stenting. b. Coumadin restarted 05/2013, Plavix continued, ASA stopped.   . Chronic kidney disease, stage III (moderate)     Followed by Dr. Fausto Skillern.  . Essential hypertension, benign   . Anemia-chronic     a. Pt reports h/o anemia - was offered IV iron in past by nephrologist but declined and has taken PO instead.  . Aneurysm, cerebral   . MRSA (methicillin resistant Staphylococcus aureus)   . Anemia   . Pulmonary nodule     CXR, 03/2013, [facility] - not described on any subsequent chest x-rays however, could be a vascular structure  . Myocardial infarction [facility])    Past Surgical History  Procedure Laterality Date  . Cholecystectomy    . Total knee arthroplasty    . Vasectomy    . Lung surgery      23 - 24 yrs. old  . Icd placement  May 06, 2011    Medtronic Protecta XT CRT-D  . Left  heart catheterization with coronary angiogram N/A 04/17/2013    Procedure: LEFT HEART CATHETERIZATION WITH CORONARY ANGIOGRAM;  Surgeon: Vesta Mixer, MD;  Location: [facility] CATH LAB;  Service: Cardiovascular;  Laterality: N/A;    Current Outpatient Prescriptions  Medication Sig Dispense Refill  . acetaminophen (TYLENOL) 500 MG tablet Take 1 tablet (500 mg total) by mouth every 6 (six) hours as needed. 30 tablet 0  . carvedilol (COREG) 12.5 MG tablet TAKE ONE & ONE-HALF TABLETS BY MOUTH TWICE DAILY WITH MEALS (DUE FOR FOLLOW UP) 270 tablet 3  . cholecalciferol (VITAMIN D) 1000 UNITS tablet Take 1,000 Units by mouth daily.    . clopidogrel (PLAVIX) 75 MG tablet Take 1 tablet (75 mg total) by mouth daily. 30 tablet 6  . clopidogrel (PLAVIX) 75 MG tablet TAKE ONE TABLET BY MOUTH ONCE DAILY 30 tablet 6  . hydrALAZINE (APRESOLINE) 25 MG tablet TAKE ONE TABLET BY MOUTH THREE TIMES DAILY 270 tablet 3  . HYDROcodone-acetaminophen (NORCO) 10-325 MG per tablet Take 1 tablet by mouth every 8 (eight) hours as needed for pain.     Marland Kitchen insulin lispro protamine-insulin lispro (HUMALOG 75/25) (75-25) 100 UNIT/ML SUSP Inject 15-25 Units into the skin 3 (three) times daily after meals. Will inject between 15 and 25 units each  time blood sugar level is checked.    . isosorbide mononitrate (IMDUR) 30 MG 24 hr tablet Take 1 tablet (30 mg total) by mouth daily. 30 tablet 5  . L-Methylfolate-Algae-B12-B6 (METANX) 3-90.314-2-35 MG CAPS TAKE ONE CAPSULE BY MOUTH ONCE DAILY 60 capsule 3  . l-methylfolate-B6-B12 (METANX) 3-35-2 MG TABS Take 1 tablet by mouth daily. 30 tablet 0  . levothyroxine (SYNTHROID, LEVOTHROID) 25 MCG tablet Take 1 tablet by mouth daily.    Marland Kitchen linagliptin (TRADJENTA) 5 MG TABS tablet Take 5 mg by mouth daily.    Marland Kitchen lisinopril (PRINIVIL,ZESTRIL) 5 MG tablet Take 2.5 mg by mouth daily.     . nitroGLYCERIN (NITROSTAT) 0.4 MG SL tablet Place 0.4 mg under the tongue every 5 (five) minutes as needed for chest  pain. For chest pain    . Omega-3 Fatty Acids (FISH OIL) 1000 MG CAPS Take 1,000 mg by mouth daily.    . Polysaccharide Iron Complex (FERREX 150 PO) Take 150 mg by mouth.    . potassium chloride (K-DUR) 10 MEQ tablet TAKE ONE-HALF TABLET BY MOUTH ONCE DAILY 45 tablet 3  . tamsulosin (FLOMAX) 0.4 MG CAPS capsule Take 1 capsule by mouth daily as needed.    . torsemide (DEMADEX) 10 MG tablet Take 1 tablet (10 mg total) by mouth daily. May take an extra pill as needed for leg swelling or weight gain of 2-3 lbs in a 24 hour period 45 tablet 3  . warfarin (COUMADIN) 5 MG tablet Take 2.5-5 mg by mouth daily at 6 PM. Takes 2.5mg  on mon only Takes 5mg  all other days    . Zinc 50 MG CAPS Take 50 mg by mouth daily.      No current facility-administered medications for this visit.    Physical Exam: Filed Vitals:   09/20/15 1009  BP: 108/58  Pulse: 52  Height: 5\' 8"  (1.727 m)  Weight: 250 lb (113.399 kg)  SpO2: 95%    GEN- The patient is well appearing, alert and oriented x 3 today.   Head- normocephalic, atraumatic Eyes-  Sclera clear, conjunctiva pink Ears- hearing intact Oropharynx- clear Lungs- Clear to ausculation bilaterally, normal work of breathing Chest- ICD pocket is well healed Heart- Regular rate and rhythm, no murmurs, rubs or gallops, PMI not laterally displaced GI- soft, NT, ND, + BS Extremities- no clubbing, cyanosis, or edema  ICD interrogation- reviewed in detail today,  See PACEART report  Assessment and Plan:  1.  Chronic systolic dysfunction euvolemic today Stable on an appropriate medical regimen Normal BiVICD function See Pace Art report No changes today Enroll in ICM clinic to follow optivol long term Recent echo reveals good response to CRT.  2. Hypertensive cardiovascular disease Stable No change required today  3. CAD Stable No change required today As he is on coumadin, would be nice to stop plavix if possible. I will defer to Dr 13/04/16  4.  Afib Continue long term anticoagualtion Maintaining sinus  carelink Return in 1 year  MD, [facility] 09/20/2015 10:58 AM

## 2015-09-20 NOTE — Patient Instructions (Signed)
Your physician recommends that you continue on your current medications as directed. Please refer to the Current Medication list given to you today. Remote device check on 12/23/15. Your physician recommends that you schedule a follow-up appointment in: 1 year. You can schedule this appointment today or you can wait for your letter to come in the mail in about 10 months reminding you to call and schedule this appointment. If you do not receive this letter, please contact our office for your appointment. You are being enrolled into the ICM clinic. You will be contacted about this.

## 2015-09-23 ENCOUNTER — Other Ambulatory Visit: Payer: Self-pay | Admitting: *Deleted

## 2015-09-23 LAB — CUP PACEART INCLINIC DEVICE CHECK
Battery Voltage: 2.9 V
Brady Statistic AP VP Percent: 2.64 %
Brady Statistic AP VS Percent: 0.06 %
Brady Statistic AS VS Percent: 0.18 %
Date Time Interrogation Session: 20161104143830
HIGH POWER IMPEDANCE MEASURED VALUE: 304 Ohm
HIGH POWER IMPEDANCE MEASURED VALUE: 52 Ohm
HighPow Impedance: 43 Ohm
Implantable Lead Implant Date: 20120620
Implantable Lead Implant Date: 20120620
Implantable Lead Location: 753858
Implantable Lead Location: 753859
Implantable Lead Model: 4196
Implantable Lead Model: 5076
Lead Channel Impedance Value: 361 Ohm
Lead Channel Impedance Value: 475 Ohm
Lead Channel Impedance Value: 665 Ohm
Lead Channel Pacing Threshold Amplitude: 0.5 V
Lead Channel Pacing Threshold Amplitude: 0.75 V
Lead Channel Pacing Threshold Amplitude: 0.75 V
Lead Channel Pacing Threshold Pulse Width: 0.4 ms
Lead Channel Pacing Threshold Pulse Width: 0.4 ms
Lead Channel Sensing Intrinsic Amplitude: 0.625 mV
Lead Channel Setting Pacing Amplitude: 2 V
Lead Channel Setting Pacing Pulse Width: 0.4 ms
Lead Channel Setting Pacing Pulse Width: 0.4 ms
Lead Channel Setting Sensing Sensitivity: 0.3 mV
MDC IDC LEAD IMPLANT DT: 20120620
MDC IDC LEAD LOCATION: 753860
MDC IDC MSMT LEADCHNL LV IMPEDANCE VALUE: 1007 Ohm
MDC IDC MSMT LEADCHNL LV PACING THRESHOLD PULSEWIDTH: 0.4 ms
MDC IDC MSMT LEADCHNL RA IMPEDANCE VALUE: 399 Ohm
MDC IDC MSMT LEADCHNL RV SENSING INTR AMPL: 7.625 mV
MDC IDC SET LEADCHNL LV PACING AMPLITUDE: 2 V
MDC IDC SET LEADCHNL RV PACING AMPLITUDE: 2.5 V
MDC IDC STAT BRADY AS VP PERCENT: 97.12 %
MDC IDC STAT BRADY RA PERCENT PACED: 2.7 %
MDC IDC STAT BRADY RV PERCENT PACED: 99.76 %

## 2015-09-23 MED ORDER — TORSEMIDE 10 MG PO TABS: 10.0000 mg | ORAL_TABLET | Freq: Every day | ORAL | Status: DC

## 2015-11-13 ENCOUNTER — Telehealth: Payer: Self-pay | Admitting: Cardiology

## 2015-11-13 DIAGNOSIS — I5022 Chronic systolic (congestive) heart failure: Secondary | ICD-10-CM

## 2015-11-13 MED ORDER — TORSEMIDE 10 MG PO TABS: 10.0000 mg | ORAL_TABLET | Freq: Two times a day (BID) | ORAL | Status: DC

## 2015-11-13 NOTE — Telephone Encounter (Signed)
Last refilled 09/23/15 with 3 refills, pt wants to call [facility] to confirm and call us back.

## 2015-11-13 NOTE — Telephone Encounter (Signed)
[name] called stating that he would like to have his Torsemide 10 mg called to [facility], [place] He is requesting 60 pills per month.  Please advise and call patient.

## 2015-11-13 NOTE — Telephone Encounter (Signed)
Yes, may change prescription to Demadex 10 mg by mouth twice a day. He needs to have a BMET if he has been taking this dose consistently to make sure that we do not need to adjust his potassium.

## 2015-11-13 NOTE — Telephone Encounter (Signed)
Pt says [facility] had refill ready that he misunderstood them. Now he is asking for 60 tabs of torsemide per month, says normally he is taking 10 mg bid instead of extra tab prn. Will forward to Dr Diona Browner if ok for next refill to be bid.

## 2015-11-13 NOTE — Telephone Encounter (Signed)
Pt aware and will have labs done. Mailed lab orders to pt. Sent new dose of torsemide 10 mg bid to pharmacy

## 2015-11-20 DIAGNOSIS — I4891 Unspecified atrial fibrillation: Secondary | ICD-10-CM | POA: Diagnosis not present

## 2015-11-20 DIAGNOSIS — I428 Other cardiomyopathies: Secondary | ICD-10-CM | POA: Diagnosis not present

## 2015-11-20 DIAGNOSIS — I5022 Chronic systolic (congestive) heart failure: Secondary | ICD-10-CM | POA: Diagnosis not present

## 2015-11-20 DIAGNOSIS — N183 Chronic kidney disease, stage 3 (moderate): Secondary | ICD-10-CM | POA: Diagnosis not present

## 2015-11-20 DIAGNOSIS — E1122 Type 2 diabetes mellitus with diabetic chronic kidney disease: Secondary | ICD-10-CM | POA: Diagnosis not present

## 2015-11-21 ENCOUNTER — Telehealth: Payer: Self-pay | Admitting: *Deleted

## 2015-11-21 NOTE — Telephone Encounter (Signed)
Patient informed. 

## 2015-11-21 NOTE — Telephone Encounter (Signed)
-----   Message from Jonelle Sidle, MD sent at 11/21/2015  9:32 AM EST ----- Reviewed. Potassium in normal range, creatinine relatively stable.

## 2015-11-22 ENCOUNTER — Encounter: Payer: Self-pay | Admitting: Cardiology

## 2015-11-22 ENCOUNTER — Ambulatory Visit (INDEPENDENT_AMBULATORY_CARE_PROVIDER_SITE_OTHER): Payer: PPO | Admitting: Cardiology

## 2015-11-22 VITALS — BP 125/73 | HR 53 | Ht 68.0 in | Wt 253.6 lb

## 2015-11-22 DIAGNOSIS — I255 Ischemic cardiomyopathy: Secondary | ICD-10-CM | POA: Diagnosis not present

## 2015-11-22 DIAGNOSIS — I480 Paroxysmal atrial fibrillation: Secondary | ICD-10-CM

## 2015-11-22 DIAGNOSIS — I2510 Atherosclerotic heart disease of native coronary artery without angina pectoris: Secondary | ICD-10-CM | POA: Diagnosis not present

## 2015-11-22 NOTE — Patient Instructions (Signed)
Your physician recommends that you continue on your current medications as directed. Please refer to the Current Medication list given to you today. Your physician recommends that you schedule a follow-up appointment in: 6 months. You will receive a reminder letter in the mail in about 4 months reminding you to call and schedule your appointment. If you don't receive this letter, please contact our office. 

## 2015-11-22 NOTE — Progress Notes (Signed)
Cardiology Office Note  Date: 11/22/2015   ID: [name], DOB 1941/01/26, MRN [id number]  PCP: [name], MD  Primary Cardiologist: Nona Dell, MD   Chief Complaint  Patient presents with  . Coronary Artery Disease  . Cardiomyopathy    History of Present Illness: [name] is a 75 y.o. male last seen in August 2016. He presents for a routine follow-up visit. Since last encounter he does not report any major change in stamina. Still dances regularly, went 3 times last week. He reports NYHA class II dyspnea. No recent angina symptoms.  We reviewed his medications which are outlined below. He does not report any changes in cardiac regimen, no intolerances. He has been maintained on Plavix and Coumadin with multiple prior percutaneous interventions and also atrial fibrillation.  No reported bleeding problems. He continues to follow in the anticoagulation clinic.  He also continues to follow with device interrogation per Dr. Johney Frame. He reports no device shocks. LVEF has improved based on his most recent echocardiogram outlined below.  ECG today shows a ventricular paced rhythm.  Past Medical History  Diagnosis Date  . Type 2 diabetes mellitus (HCC)   . Coronary atherosclerosis of native coronary artery     a. DES to RCA 03/2009. b. s/p PTCA to RCA for ISR, 05/2010. c. 03/2013 NSTEMI 2/2 severe prox RCA s/p PTCA/DES, med rx for residual dz.  . Hyperlipidemia   . TIA (transient ischemic attack)     Multiple TIAs  . Cardiomyopathy, ischemic     LVEF 25-35%.  . Morbid obesity (HCC)   . BPH (benign prostatic hypertrophy)   . LBBB (left bundle branch block)     s/p BiV ICD Implanted by Dr Graciela Husbands  . Paroxysmal atrial fibrillation (HCC)     a. Coumadin discontinue in 2010 when the patient required ASA/Plavix for stenting. b. Coumadin restarted 05/2013, Plavix continued, ASA stopped.   . Chronic kidney disease, stage III (moderate)     Followed by Dr. Fausto Skillern.  . Essential  hypertension, benign   . Anemia-chronic     a. Pt reports h/o anemia - was offered IV iron in past by nephrologist but declined and has taken PO instead.  . Aneurysm, cerebral   . MRSA (methicillin resistant Staphylococcus aureus)   . Anemia   . Pulmonary nodule     CXR, 03/2013, [facility] - not described on any subsequent chest x-rays however, could be a vascular structure  . Myocardial infarction [facility])     Current Outpatient Prescriptions  Medication Sig Dispense Refill  . acetaminophen (TYLENOL) 500 MG tablet Take 1 tablet (500 mg total) by mouth every 6 (six) hours as needed. 30 tablet 0  . carvedilol (COREG) 12.5 MG tablet TAKE ONE & ONE-HALF TABLETS BY MOUTH TWICE DAILY WITH MEALS (DUE FOR FOLLOW UP) 270 tablet 3  . cholecalciferol (VITAMIN D) 1000 UNITS tablet Take 1,000 Units by mouth daily.    . clopidogrel (PLAVIX) 75 MG tablet Take 1 tablet (75 mg total) by mouth daily. 30 tablet 6  . clopidogrel (PLAVIX) 75 MG tablet TAKE ONE TABLET BY MOUTH ONCE DAILY 30 tablet 6  . hydrALAZINE (APRESOLINE) 25 MG tablet TAKE ONE TABLET BY MOUTH THREE TIMES DAILY 270 tablet 3  . HYDROcodone-acetaminophen (NORCO) 10-325 MG per tablet Take 1 tablet by mouth every 8 (eight) hours as needed for pain.     Marland Kitchen insulin lispro protamine-insulin lispro (HUMALOG 75/25) (75-25) 100 UNIT/ML SUSP Inject 15-25 Units into the skin  3 (three) times daily after meals. Will inject between 15 and 25 units each time blood sugar level is checked.    . isosorbide mononitrate (IMDUR) 30 MG 24 hr tablet Take 1 tablet (30 mg total) by mouth daily. 30 tablet 5  . L-Methylfolate-Algae-B12-B6 (METANX) 3-90.314-2-35 MG CAPS TAKE ONE CAPSULE BY MOUTH ONCE DAILY 60 capsule 3  . l-methylfolate-B6-B12 (METANX) 3-35-2 MG TABS Take 1 tablet by mouth daily. 30 tablet 0  . levothyroxine (SYNTHROID, LEVOTHROID) 25 MCG tablet Take 1 tablet by mouth daily.    Marland Kitchen linagliptin (TRADJENTA) 5 MG TABS tablet Take 5 mg by mouth daily.    Marland Kitchen lisinopril  (PRINIVIL,ZESTRIL) 5 MG tablet Take 2.5 mg by mouth daily.     . nitroGLYCERIN (NITROSTAT) 0.4 MG SL tablet Place 0.4 mg under the tongue every 5 (five) minutes as needed for chest pain. For chest pain    . Omega-3 Fatty Acids (FISH OIL) 1000 MG CAPS Take 1,000 mg by mouth daily.    . Polysaccharide Iron Complex (FERREX 150 PO) Take 150 mg by mouth.    . potassium chloride (K-DUR) 10 MEQ tablet TAKE ONE-HALF TABLET BY MOUTH ONCE DAILY 45 tablet 3  . torsemide (DEMADEX) 10 MG tablet Take 1 tablet (10 mg total) by mouth 2 (two) times daily. 60 tablet 3  . warfarin (COUMADIN) 5 MG tablet Take 2.5-5 mg by mouth daily at 6 PM. Takes 2.5mg  on mon only Takes 5mg  all other days    . Zinc 50 MG CAPS Take 50 mg by mouth daily.      No current facility-administered medications for this visit.   Allergies:  Codeine   Social History: The patient  reports that he quit smoking about 51 years ago. His smoking use included Cigarettes. He started smoking about 66 years ago. He has a 6 pack-year smoking history. He quit smokeless tobacco use about 50 years ago. He reports that he drinks alcohol. He reports that he does not use illicit drugs.   ROS:  Please see the history of present illness. Otherwise, complete review of systems is positive for arthritic pains.  All other systems are reviewed and negative.   Physical Exam: VS:  BP 125/73 mmHg  Pulse 53  Ht 5\' 8"  (1.727 m)  Wt 253 lb 9.6 oz (115.032 kg)  BMI 38.57 kg/m2  SpO2 97%, BMI Body mass index is 38.57 kg/(m^2).  Wt Readings from Last 3 Encounters:  11/22/15 253 lb 9.6 oz (115.032 kg)  09/20/15 250 lb (113.399 kg)  06/19/15 257 lb (116.574 kg)    Appears comfortable.  HEENT: Conjunctiva and lids normal, oropharynx with moist mucosa.  Neck: Supple, no elevated JVP with increased girth.  Cardiac: Regular rate and rhythm, no S3.  Lungs: Clear, nonlabored. Abdomen: Obese, nontender, bowel sounds present.  Skin: Warm and dry.  Extremities:  1+ leg edema.   ECG: ECG is ordered today.  Recent Labwork:  January 2017 : BUN 29, creatinine 1.6, potassium 5.0  Other Studies Reviewed Today:  Echocardiogram 07/10/2015: Study Conclusions  - Procedure narrative: Transthoracic echocardiography for left ventricular function evaluation, for right ventricular function evaluation, and for assessment of valvular function. Image quality was poor. - Left ventricle: The cavity size was normal. Systolic function was difficult to assess due to poor visualization, but appeared grossly normal. LVEF 50-55%. Doppler parameters are consistent with abnormal left ventricular relaxation (grade 1 diastolic dysfunction). Doppler parameters are consistent with high ventricular filling pressure. Moderate to severe focal basal septal  hypertrophy. - Regional wall motion abnormality: Hypokinesis of the mid inferior and mid inferolateral myocardium. - Ventricular septum: Septal motion showed abnormal function and dyssynergy. These changes are consistent with intraventricular conduction delay. - Aortic valve: Mildly to moderately calcified annulus. Mildly calcified leaflets. - Mitral valve: Mildly calcified annulus. - Right ventricle: Pacer wire or catheter noted in right ventricle. - Right atrium: Pacer wire or catheter noted in right atrium.  Impressions:  - Consider contrast enhancement for more optimal left ventricular EF and regional wall motion assessment.  Assessment and Plan:  1. CAD status post multiple percutaneous interventions as outlined above. He remains clinically stable without progressive angina on medical therapy. No changes made to current regimen.  2. Ischemic cardiomyopathy with significant improvement in LVEF based on most recent echocardiogram from last year. He is status post biventricular Medtronic ICD and follows with Dr. Johney Frame. No device shocks.  3. Paroxysmal atrial fibrillation, continues  on Coumadin.  Current medicines were reviewed with the patient today.   Orders Placed This Encounter  Procedures  . EKG 12-Lead    Disposition: FU with me in 6 months.   Signed, Jonelle Sidle, MD, [facility] 11/22/2015 11:53 AM    [facility] at [facility] [address] [place], [place], [place] [address] Phone: [phone]; Fax: [phone]

## 2015-12-06 DIAGNOSIS — E1165 Type 2 diabetes mellitus with hyperglycemia: Secondary | ICD-10-CM | POA: Diagnosis not present

## 2015-12-06 DIAGNOSIS — I10 Essential (primary) hypertension: Secondary | ICD-10-CM | POA: Diagnosis not present

## 2015-12-06 DIAGNOSIS — N189 Chronic kidney disease, unspecified: Secondary | ICD-10-CM | POA: Diagnosis not present

## 2015-12-06 DIAGNOSIS — G609 Hereditary and idiopathic neuropathy, unspecified: Secondary | ICD-10-CM | POA: Diagnosis not present

## 2015-12-18 DIAGNOSIS — I4891 Unspecified atrial fibrillation: Secondary | ICD-10-CM | POA: Diagnosis not present

## 2015-12-23 ENCOUNTER — Telehealth: Payer: Self-pay | Admitting: Cardiology

## 2015-12-23 ENCOUNTER — Ambulatory Visit (INDEPENDENT_AMBULATORY_CARE_PROVIDER_SITE_OTHER): Payer: PPO | Admitting: *Deleted

## 2015-12-23 DIAGNOSIS — I255 Ischemic cardiomyopathy: Secondary | ICD-10-CM

## 2015-12-23 NOTE — Telephone Encounter (Signed)
Spoke with pt and reminded pt of remote transmission that is due today. Pt verbalized understanding.   

## 2015-12-24 NOTE — Progress Notes (Signed)
Remote ICD transmission.   

## 2015-12-30 ENCOUNTER — Other Ambulatory Visit: Payer: Self-pay | Admitting: *Deleted

## 2015-12-30 MED ORDER — TORSEMIDE 10 MG PO TABS: 10.0000 mg | ORAL_TABLET | Freq: Two times a day (BID) | ORAL | Status: AC

## 2016-01-02 DIAGNOSIS — E1129 Type 2 diabetes mellitus with other diabetic kidney complication: Secondary | ICD-10-CM | POA: Diagnosis not present

## 2016-01-02 DIAGNOSIS — R809 Proteinuria, unspecified: Secondary | ICD-10-CM | POA: Diagnosis not present

## 2016-01-02 DIAGNOSIS — E559 Vitamin D deficiency, unspecified: Secondary | ICD-10-CM | POA: Diagnosis not present

## 2016-01-02 DIAGNOSIS — I10 Essential (primary) hypertension: Secondary | ICD-10-CM | POA: Diagnosis not present

## 2016-01-02 DIAGNOSIS — N183 Chronic kidney disease, stage 3 (moderate): Secondary | ICD-10-CM | POA: Diagnosis not present

## 2016-01-02 DIAGNOSIS — D649 Anemia, unspecified: Secondary | ICD-10-CM | POA: Diagnosis not present

## 2016-01-02 DIAGNOSIS — Z79899 Other long term (current) drug therapy: Secondary | ICD-10-CM | POA: Diagnosis not present

## 2016-01-12 LAB — CUP PACEART REMOTE DEVICE CHECK
Brady Statistic AP VP Percent: 2.32 %
Brady Statistic AP VS Percent: 0.09 %
Brady Statistic AS VS Percent: 0.21 %
Brady Statistic RV Percent Paced: 99.7 %
Date Time Interrogation Session: 20170206192214
HIGH POWER IMPEDANCE MEASURED VALUE: 342 Ohm
HIGH POWER IMPEDANCE MEASURED VALUE: 57 Ohm
HighPow Impedance: 45 Ohm
Implantable Lead Implant Date: 20120620
Implantable Lead Location: 753858
Implantable Lead Location: 753859
Implantable Lead Location: 753860
Implantable Lead Model: 6947
Lead Channel Impedance Value: 418 Ohm
Lead Channel Impedance Value: 475 Ohm
Lead Channel Impedance Value: 646 Ohm
Lead Channel Pacing Threshold Amplitude: 0.625 V
Lead Channel Pacing Threshold Amplitude: 0.875 V
Lead Channel Pacing Threshold Pulse Width: 0.4 ms
Lead Channel Sensing Intrinsic Amplitude: 1.25 mV
Lead Channel Sensing Intrinsic Amplitude: 9.125 mV
Lead Channel Setting Pacing Amplitude: 2.5 V
Lead Channel Setting Pacing Pulse Width: 0.4 ms
MDC IDC LEAD IMPLANT DT: 20120620
MDC IDC LEAD IMPLANT DT: 20120620
MDC IDC LEAD MODEL: 4196
MDC IDC MSMT BATTERY VOLTAGE: 2.8 V
MDC IDC MSMT LEADCHNL LV IMPEDANCE VALUE: 988 Ohm
MDC IDC MSMT LEADCHNL LV PACING THRESHOLD PULSEWIDTH: 0.4 ms
MDC IDC MSMT LEADCHNL RA IMPEDANCE VALUE: 418 Ohm
MDC IDC MSMT LEADCHNL RA PACING THRESHOLD AMPLITUDE: 0.625 V
MDC IDC MSMT LEADCHNL RA SENSING INTR AMPL: 1.25 mV
MDC IDC MSMT LEADCHNL RV PACING THRESHOLD PULSEWIDTH: 0.4 ms
MDC IDC MSMT LEADCHNL RV SENSING INTR AMPL: 9.125 mV
MDC IDC SET LEADCHNL LV PACING AMPLITUDE: 2 V
MDC IDC SET LEADCHNL RA PACING AMPLITUDE: 2 V
MDC IDC SET LEADCHNL RV PACING PULSEWIDTH: 0.4 ms
MDC IDC SET LEADCHNL RV SENSING SENSITIVITY: 0.3 mV
MDC IDC STAT BRADY AS VP PERCENT: 97.38 %
MDC IDC STAT BRADY RA PERCENT PACED: 2.41 %

## 2016-01-14 DIAGNOSIS — E1129 Type 2 diabetes mellitus with other diabetic kidney complication: Secondary | ICD-10-CM | POA: Diagnosis not present

## 2016-01-14 DIAGNOSIS — I509 Heart failure, unspecified: Secondary | ICD-10-CM | POA: Diagnosis not present

## 2016-01-14 DIAGNOSIS — D509 Iron deficiency anemia, unspecified: Secondary | ICD-10-CM | POA: Diagnosis not present

## 2016-01-14 DIAGNOSIS — I10 Essential (primary) hypertension: Secondary | ICD-10-CM | POA: Diagnosis not present

## 2016-01-14 DIAGNOSIS — N183 Chronic kidney disease, stage 3 (moderate): Secondary | ICD-10-CM | POA: Diagnosis not present

## 2016-01-15 ENCOUNTER — Encounter: Payer: Self-pay | Admitting: Cardiology

## 2016-01-15 DIAGNOSIS — I4891 Unspecified atrial fibrillation: Secondary | ICD-10-CM | POA: Diagnosis not present

## 2016-01-15 DIAGNOSIS — L309 Dermatitis, unspecified: Secondary | ICD-10-CM | POA: Diagnosis not present

## 2016-01-15 DIAGNOSIS — M2550 Pain in unspecified joint: Secondary | ICD-10-CM | POA: Diagnosis not present

## 2016-01-15 DIAGNOSIS — Z87891 Personal history of nicotine dependence: Secondary | ICD-10-CM | POA: Diagnosis not present

## 2016-01-15 DIAGNOSIS — Z6837 Body mass index (BMI) 37.0-37.9, adult: Secondary | ICD-10-CM | POA: Diagnosis not present

## 2016-01-23 ENCOUNTER — Encounter (HOSPITAL_COMMUNITY): Payer: Medicare Other

## 2016-01-26 ENCOUNTER — Other Ambulatory Visit: Payer: Self-pay | Admitting: Cardiology

## 2016-01-29 ENCOUNTER — Encounter (HOSPITAL_COMMUNITY)
Admission: RE | Admit: 2016-01-29 | Discharge: 2016-01-29 | Disposition: A | Discharge status: Home / Self Care | Payer: PPO | Source: Ambulatory Visit | Attending: Nephrology | Admitting: Nephrology

## 2016-01-29 DIAGNOSIS — N183 Chronic kidney disease, stage 3 (moderate): Secondary | ICD-10-CM | POA: Insufficient documentation

## 2016-01-29 DIAGNOSIS — D509 Iron deficiency anemia, unspecified: Secondary | ICD-10-CM | POA: Diagnosis not present

## 2016-01-29 MED ORDER — SODIUM CHLORIDE 0.9 % IV SOLN
510.0000 mg | INTRAVENOUS | Status: DC
  Administered 2016-01-29: 510 mg via INTRAVENOUS
  Filled 2016-01-29: qty 17

## 2016-01-29 MED ORDER — SODIUM CHLORIDE 0.9 % IV SOLN: INTRAVENOUS | Status: DC

## 2016-01-30 ENCOUNTER — Ambulatory Visit: Payer: PPO | Admitting: Family

## 2016-02-05 ENCOUNTER — Encounter (HOSPITAL_COMMUNITY)
Admission: RE | Admit: 2016-02-05 | Discharge: 2016-02-05 | Disposition: A | Discharge status: Home / Self Care | Payer: PPO | Source: Ambulatory Visit | Attending: Nephrology | Admitting: Nephrology

## 2016-02-05 DIAGNOSIS — N183 Chronic kidney disease, stage 3 (moderate): Secondary | ICD-10-CM | POA: Diagnosis not present

## 2016-02-05 MED ORDER — SODIUM CHLORIDE 0.9 % IV SOLN
510.0000 mg | Freq: Once | INTRAVENOUS | Status: AC
  Administered 2016-02-05: 510 mg via INTRAVENOUS
  Filled 2016-02-05: qty 17

## 2016-02-05 MED ORDER — SODIUM CHLORIDE 0.9 % IV SOLN
INTRAVENOUS | Status: DC
  Administered 2016-02-05: 11:00:00 via INTRAVENOUS

## 2016-02-06 ENCOUNTER — Inpatient Hospital Stay (HOSPITAL_COMMUNITY): Admission: RE | Admit: 2016-02-06 | Payer: PPO | Source: Ambulatory Visit

## 2016-02-12 DIAGNOSIS — E1022 Type 1 diabetes mellitus with diabetic chronic kidney disease: Secondary | ICD-10-CM | POA: Diagnosis not present

## 2016-02-12 DIAGNOSIS — N183 Chronic kidney disease, stage 3 (moderate): Secondary | ICD-10-CM | POA: Diagnosis not present

## 2016-02-12 DIAGNOSIS — I4891 Unspecified atrial fibrillation: Secondary | ICD-10-CM | POA: Diagnosis not present

## 2016-02-12 DIAGNOSIS — Z6837 Body mass index (BMI) 37.0-37.9, adult: Secondary | ICD-10-CM | POA: Diagnosis not present

## 2016-02-12 DIAGNOSIS — M19049 Primary osteoarthritis, unspecified hand: Secondary | ICD-10-CM | POA: Diagnosis not present

## 2016-02-27 DIAGNOSIS — H11152 Pinguecula, left eye: Secondary | ICD-10-CM | POA: Diagnosis not present

## 2016-03-08 ENCOUNTER — Other Ambulatory Visit: Payer: Self-pay | Admitting: Cardiology

## 2016-03-13 DIAGNOSIS — I4891 Unspecified atrial fibrillation: Secondary | ICD-10-CM | POA: Diagnosis not present

## 2016-03-13 DIAGNOSIS — I10 Essential (primary) hypertension: Secondary | ICD-10-CM | POA: Diagnosis not present

## 2016-03-13 DIAGNOSIS — M1990 Unspecified osteoarthritis, unspecified site: Secondary | ICD-10-CM | POA: Diagnosis not present

## 2016-03-13 DIAGNOSIS — N183 Chronic kidney disease, stage 3 (moderate): Secondary | ICD-10-CM | POA: Diagnosis not present

## 2016-03-13 DIAGNOSIS — E1022 Type 1 diabetes mellitus with diabetic chronic kidney disease: Secondary | ICD-10-CM | POA: Diagnosis not present

## 2016-03-23 ENCOUNTER — Ambulatory Visit (INDEPENDENT_AMBULATORY_CARE_PROVIDER_SITE_OTHER): Payer: PPO | Admitting: *Deleted

## 2016-03-23 ENCOUNTER — Telehealth: Payer: Self-pay | Admitting: Cardiology

## 2016-03-23 DIAGNOSIS — Z95810 Presence of automatic (implantable) cardiac defibrillator: Secondary | ICD-10-CM | POA: Diagnosis not present

## 2016-03-23 DIAGNOSIS — I255 Ischemic cardiomyopathy: Secondary | ICD-10-CM

## 2016-03-23 NOTE — Telephone Encounter (Signed)
Spoke with pt and reminded pt of remote transmission that is due today. Pt verbalized understanding.   

## 2016-03-24 NOTE — Progress Notes (Signed)
Remote ICD transmission.   

## 2016-04-14 DIAGNOSIS — E1140 Type 2 diabetes mellitus with diabetic neuropathy, unspecified: Secondary | ICD-10-CM | POA: Diagnosis not present

## 2016-04-14 DIAGNOSIS — M19049 Primary osteoarthritis, unspecified hand: Secondary | ICD-10-CM | POA: Diagnosis not present

## 2016-04-14 DIAGNOSIS — I4891 Unspecified atrial fibrillation: Secondary | ICD-10-CM | POA: Diagnosis not present

## 2016-04-22 DIAGNOSIS — Z79899 Other long term (current) drug therapy: Secondary | ICD-10-CM | POA: Diagnosis not present

## 2016-04-22 DIAGNOSIS — R809 Proteinuria, unspecified: Secondary | ICD-10-CM | POA: Diagnosis not present

## 2016-04-22 DIAGNOSIS — N183 Chronic kidney disease, stage 3 (moderate): Secondary | ICD-10-CM | POA: Diagnosis not present

## 2016-04-22 DIAGNOSIS — D509 Iron deficiency anemia, unspecified: Secondary | ICD-10-CM | POA: Diagnosis not present

## 2016-04-22 DIAGNOSIS — I10 Essential (primary) hypertension: Secondary | ICD-10-CM | POA: Diagnosis not present

## 2016-04-22 DIAGNOSIS — E559 Vitamin D deficiency, unspecified: Secondary | ICD-10-CM | POA: Diagnosis not present

## 2016-04-28 DIAGNOSIS — R809 Proteinuria, unspecified: Secondary | ICD-10-CM | POA: Diagnosis not present

## 2016-04-28 DIAGNOSIS — E1129 Type 2 diabetes mellitus with other diabetic kidney complication: Secondary | ICD-10-CM | POA: Diagnosis not present

## 2016-04-28 DIAGNOSIS — E876 Hypokalemia: Secondary | ICD-10-CM | POA: Diagnosis not present

## 2016-04-28 DIAGNOSIS — I10 Essential (primary) hypertension: Secondary | ICD-10-CM | POA: Diagnosis not present

## 2016-04-28 DIAGNOSIS — D509 Iron deficiency anemia, unspecified: Secondary | ICD-10-CM | POA: Diagnosis not present

## 2016-04-28 DIAGNOSIS — N183 Chronic kidney disease, stage 3 (moderate): Secondary | ICD-10-CM | POA: Diagnosis not present

## 2016-04-28 DIAGNOSIS — I509 Heart failure, unspecified: Secondary | ICD-10-CM | POA: Diagnosis not present

## 2016-04-28 LAB — CUP PACEART REMOTE DEVICE CHECK
Battery Voltage: 2.74 V
Brady Statistic AS VS Percent: 0.23 %
Brady Statistic RA Percent Paced: 4.69 %
Brady Statistic RV Percent Paced: 99.66 %
Date Time Interrogation Session: 20170508083729
HIGH POWER IMPEDANCE MEASURED VALUE: 59 Ohm
HighPow Impedance: 342 Ohm
HighPow Impedance: 48 Ohm
Implantable Lead Implant Date: 20120620
Implantable Lead Implant Date: 20120620
Implantable Lead Location: 753858
Implantable Lead Location: 753860
Implantable Lead Model: 4196
Implantable Lead Model: 6947
Lead Channel Impedance Value: 399 Ohm
Lead Channel Impedance Value: 703 Ohm
Lead Channel Pacing Threshold Amplitude: 0.625 V
Lead Channel Pacing Threshold Pulse Width: 0.4 ms
Lead Channel Sensing Intrinsic Amplitude: 1 mV
Lead Channel Setting Pacing Amplitude: 2 V
MDC IDC LEAD IMPLANT DT: 20120620
MDC IDC LEAD LOCATION: 753859
MDC IDC MSMT LEADCHNL LV IMPEDANCE VALUE: 1083 Ohm
MDC IDC MSMT LEADCHNL LV IMPEDANCE VALUE: 513 Ohm
MDC IDC MSMT LEADCHNL LV PACING THRESHOLD AMPLITUDE: 0.875 V
MDC IDC MSMT LEADCHNL LV PACING THRESHOLD PULSEWIDTH: 0.4 ms
MDC IDC MSMT LEADCHNL RA PACING THRESHOLD AMPLITUDE: 0.5 V
MDC IDC MSMT LEADCHNL RA PACING THRESHOLD PULSEWIDTH: 0.4 ms
MDC IDC MSMT LEADCHNL RA SENSING INTR AMPL: 1 mV
MDC IDC MSMT LEADCHNL RV IMPEDANCE VALUE: 399 Ohm
MDC IDC MSMT LEADCHNL RV SENSING INTR AMPL: 9 mV
MDC IDC MSMT LEADCHNL RV SENSING INTR AMPL: 9 mV
MDC IDC SET LEADCHNL LV PACING PULSEWIDTH: 0.4 ms
MDC IDC SET LEADCHNL RA PACING AMPLITUDE: 2 V
MDC IDC SET LEADCHNL RV PACING AMPLITUDE: 2.5 V
MDC IDC SET LEADCHNL RV PACING PULSEWIDTH: 0.4 ms
MDC IDC SET LEADCHNL RV SENSING SENSITIVITY: 0.3 mV
MDC IDC STAT BRADY AP VP PERCENT: 4.58 %
MDC IDC STAT BRADY AP VS PERCENT: 0.11 %
MDC IDC STAT BRADY AS VP PERCENT: 95.08 %

## 2016-04-29 ENCOUNTER — Encounter: Payer: Self-pay | Admitting: Cardiology

## 2016-05-08 DIAGNOSIS — M19132 Post-traumatic osteoarthritis, left wrist: Secondary | ICD-10-CM | POA: Diagnosis not present

## 2016-05-21 ENCOUNTER — Ambulatory Visit (INDEPENDENT_AMBULATORY_CARE_PROVIDER_SITE_OTHER): Payer: PPO | Admitting: Cardiology

## 2016-05-21 ENCOUNTER — Encounter: Payer: Self-pay | Admitting: Cardiology

## 2016-05-21 VITALS — BP 108/74 | HR 58 | Ht 68.0 in | Wt 257.0 lb

## 2016-05-21 DIAGNOSIS — I255 Ischemic cardiomyopathy: Secondary | ICD-10-CM | POA: Diagnosis not present

## 2016-05-21 DIAGNOSIS — E1122 Type 2 diabetes mellitus with diabetic chronic kidney disease: Secondary | ICD-10-CM | POA: Diagnosis not present

## 2016-05-21 DIAGNOSIS — I4891 Unspecified atrial fibrillation: Secondary | ICD-10-CM | POA: Diagnosis not present

## 2016-05-21 DIAGNOSIS — I2510 Atherosclerotic heart disease of native coronary artery without angina pectoris: Secondary | ICD-10-CM | POA: Diagnosis not present

## 2016-05-21 DIAGNOSIS — I480 Paroxysmal atrial fibrillation: Secondary | ICD-10-CM | POA: Diagnosis not present

## 2016-05-21 DIAGNOSIS — I10 Essential (primary) hypertension: Secondary | ICD-10-CM | POA: Diagnosis not present

## 2016-05-21 DIAGNOSIS — E785 Hyperlipidemia, unspecified: Secondary | ICD-10-CM | POA: Diagnosis not present

## 2016-05-21 DIAGNOSIS — N183 Chronic kidney disease, stage 3 (moderate): Secondary | ICD-10-CM | POA: Diagnosis not present

## 2016-05-21 DIAGNOSIS — M2550 Pain in unspecified joint: Secondary | ICD-10-CM | POA: Diagnosis not present

## 2016-05-21 DIAGNOSIS — Z299 Encounter for prophylactic measures, unspecified: Secondary | ICD-10-CM | POA: Diagnosis not present

## 2016-05-21 DIAGNOSIS — Z6837 Body mass index (BMI) 37.0-37.9, adult: Secondary | ICD-10-CM | POA: Diagnosis not present

## 2016-05-21 NOTE — Progress Notes (Signed)
Cardiology Office Note  Date: 05/21/2016   ID: [name], DOB 01/17/1941, MRN [id number]  PCP: [name], MD  Primary Cardiologist: Nona Dell, MD   Chief Complaint  Patient presents with  . Coronary Artery Disease  . Cardiomyopathy  . PAF    History of Present Illness: [name] is a 75 y.o. male as seen in January. He presents for a routine follow-up visit. Reports no change in stamina, still dances 2 or 3 days a week. He does not report any angina or nitroglycerin use. No palpitations or device discharges. No unexplained syncope.  He continues on Coumadin with follow-up per Dr. Sherryll Burger. He does not report any bleeding problems.  He continues to follow in the device clinic with Dr. Johney Frame.  I reviewed his medications which are outlined below in stable from a cardiac perspective.  Past Medical History  Diagnosis Date  . Type 2 diabetes mellitus (HCC)   . Coronary atherosclerosis of native coronary artery     a. DES to RCA 03/2009. b. s/p PTCA to RCA for ISR, 05/2010. c. 03/2013 NSTEMI 2/2 severe prox RCA s/p PTCA/DES, med rx for residual dz.  . Hyperlipidemia   . TIA (transient ischemic attack)     Multiple TIAs  . Cardiomyopathy, ischemic     LVEF 25-35%.  . Morbid obesity (HCC)   . BPH (benign prostatic hypertrophy)   . LBBB (left bundle branch block)     s/p BiV ICD Implanted by Dr Graciela Husbands  . Paroxysmal atrial fibrillation (HCC)     a. Coumadin discontinue in 2010 when the patient required ASA/Plavix for stenting. b. Coumadin restarted 05/2013, Plavix continued, ASA stopped.   . Chronic kidney disease, stage III (moderate)     Followed by Dr. Fausto Skillern.  . Essential hypertension, benign   . Anemia-chronic     a. Pt reports h/o anemia - was offered IV iron in past by nephrologist but declined and has taken PO instead.  . Aneurysm, cerebral   . MRSA (methicillin resistant Staphylococcus aureus)   . Anemia   . Pulmonary nodule     CXR, 03/2013, [facility] - not  described on any subsequent chest x-rays however, could be a vascular structure  . Myocardial infarction [facility])     Past Surgical History  Procedure Laterality Date  . Cholecystectomy    . Total knee arthroplasty    . Vasectomy    . Lung surgery      23 - 24 yrs. old  . Icd placement  May 06, 2011    Medtronic Protecta XT CRT-D  . Left heart catheterization with coronary angiogram N/A 04/17/2013    Procedure: LEFT HEART CATHETERIZATION WITH CORONARY ANGIOGRAM;  Surgeon: Vesta Mixer, MD;  Location: [facility] CATH LAB;  Service: Cardiovascular;  Laterality: N/A;    Current Outpatient Prescriptions  Medication Sig Dispense Refill  . acetaminophen (TYLENOL) 500 MG tablet Take 1 tablet (500 mg total) by mouth every 6 (six) hours as needed. 30 tablet 0  . carvedilol (COREG) 12.5 MG tablet TAKE ONE & ONE-HALF TABLETS BY MOUTH TWICE DAILY WITH MEALS (DUE FOR FOLLOW UP) 270 tablet 3  . cholecalciferol (VITAMIN D) 1000 UNITS tablet Take 1,000 Units by mouth daily.    . clopidogrel (PLAVIX) 75 MG tablet TAKE ONE TABLET BY MOUTH ONCE DAILY 30 tablet 6  . gabapentin (NEURONTIN) 100 MG capsule Take 100 mg by mouth 2 (two) times daily.    . hydrALAZINE (APRESOLINE) 25 MG tablet TAKE  ONE TABLET BY MOUTH THREE TIMES DAILY 270 tablet 3  . HYDROcodone-acetaminophen (NORCO) 10-325 MG per tablet Take 1 tablet by mouth every 8 (eight) hours as needed for pain.     Marland Kitchen insulin lispro protamine-insulin lispro (HUMALOG 75/25) (75-25) 100 UNIT/ML SUSP Inject 15-25 Units into the skin 3 (three) times daily after meals. Will inject between 15 and 25 units each time blood sugar level is checked.    . isosorbide mononitrate (IMDUR) 30 MG 24 hr tablet TAKE ONE TABLET BY MOUTH ONCE DAILY 30 tablet 6  . L-Methylfolate-Algae-B12-B6 (METANX) 3-90.314-2-35 MG CAPS TAKE ONE CAPSULE BY MOUTH ONCE DAILY 60 capsule 3  . l-methylfolate-B6-B12 (METANX) 3-35-2 MG TABS Take 1 tablet by mouth daily. 30 tablet 0  . levothyroxine  (SYNTHROID, LEVOTHROID) 25 MCG tablet Take 1 tablet by mouth daily.    Marland Kitchen linagliptin (TRADJENTA) 5 MG TABS tablet Take 5 mg by mouth daily.    Marland Kitchen lisinopril (PRINIVIL,ZESTRIL) 5 MG tablet Take 2.5 mg by mouth daily.     . nitroGLYCERIN (NITROSTAT) 0.4 MG SL tablet Place 0.4 mg under the tongue every 5 (five) minutes as needed for chest pain. For chest pain    . Omega-3 Fatty Acids (FISH OIL) 1000 MG CAPS Take 1,000 mg by mouth daily.    . Polysaccharide Iron Complex (FERREX 150 PO) Take 150 mg by mouth.    . potassium chloride (K-DUR) 10 MEQ tablet TAKE ONE-HALF TABLET BY MOUTH ONCE DAILY 45 tablet 3  . torsemide (DEMADEX) 10 MG tablet Take 1 tablet (10 mg total) by mouth 2 (two) times daily. 60 tablet 3  . warfarin (COUMADIN) 5 MG tablet Take 2.5-5 mg by mouth daily at 6 PM. Takes 2.5mg  on mon only Takes 5mg  all other days    . Zinc 50 MG CAPS Take 50 mg by mouth daily.      No current facility-administered medications for this visit.   Allergies:  Codeine   Social History: The patient  reports that he quit smoking about 51 years ago. His smoking use included Cigarettes. He started smoking about 66 years ago. He has a 6 pack-year smoking history. He quit smokeless tobacco use about 51 years ago. He reports that he drinks alcohol. He reports that he does not use illicit drugs.   ROS:  Please see the history of present illness. Otherwise, complete review of systems is positive for arthritic pains and stiffness.  All other systems are reviewed and negative.   Physical Exam: VS:  BP 108/74 mmHg  Pulse 58  Ht 5\' 8"  (1.727 m)  Wt 257 lb (116.574 kg)  BMI 39.09 kg/m2  SpO2 94%, BMI Body mass index is 39.09 kg/(m^2).  Wt Readings from Last 3 Encounters:  05/21/16 257 lb (116.574 kg)  02/05/16 253 lb (114.76 kg)  11/22/15 253 lb 9.6 oz (115.032 kg)    Appears comfortable.  HEENT: Conjunctiva and lids normal, oropharynx with moist mucosa.  Neck: Supple, no elevated JVP with increased  girth.  Cardiac: Regular rate and rhythm, no S3.  Lungs: Clear, nonlabored. Abdomen: Obese, nontender, bowel sounds present.  Skin: Warm and dry.  Extremities: 1+ leg edema.   ECG: I personally reviewed the tracing from 11/22/2015 which showed a ventricular paced rhythm  Recent Labwork:  January 2017: BUN 29, creatinine 1.6, potassium 5.0  Other Studies Reviewed Today:  Echocardiogram 07/10/2015: Study Conclusions  - Procedure narrative: Transthoracic echocardiography for left ventricular function evaluation, for right ventricular function evaluation, and for assessment of  valvular function. Image quality was poor. - Left ventricle: The cavity size was normal. Systolic function was difficult to assess due to poor visualization, but appeared grossly normal. LVEF 50-55%. Doppler parameters are consistent with abnormal left ventricular relaxation (grade 1 diastolic dysfunction). Doppler parameters are consistent with high ventricular filling pressure. Moderate to severe focal basal septal hypertrophy. - Regional wall motion abnormality: Hypokinesis of the mid inferior and mid inferolateral myocardium. - Ventricular septum: Septal motion showed abnormal function and dyssynergy. These changes are consistent with intraventricular conduction delay. - Aortic valve: Mildly to moderately calcified annulus. Mildly calcified leaflets. - Mitral valve: Mildly calcified annulus. - Right ventricle: Pacer wire or catheter noted in right ventricle. - Right atrium: Pacer wire or catheter noted in right atrium.  Impressions:  - Consider contrast enhancement for more optimal left ventricular EF and regional wall motion assessment.  Assessment and Plan:  1. Symptomatically stable CAD status post multiple percutaneous interventions as described above. No changes made present regimen. Continue observation.  2. Ischemic cardiomyopathy with improvement in LVEF to  the range of 50-55%. He has a Medtronic CRT-D in place, follows with Dr. Johney Frame. No device shocks.  3. Paroxysmal atrial fibrillation, no palpitations reported. He continues on Coumadin, followed by Dr. Sherryll Burger.  4. Essential hypertension, blood pressure is well controlled today.  Current medicines were reviewed with the patient today.  Disposition: Follow-up with me in 6 months.  Signed, Jonelle Sidle, MD, [facility] 05/21/2016 9:40 AM    [facility] at [facility] [address] [place], [place], [place] [address] Phone: [phone]; Fax: [phone]

## 2016-05-21 NOTE — Patient Instructions (Signed)
Medication Instructions:  Continue all current medications.  Labwork: NONE  Testing/Procedures: NONE  Follow-Up: Your physician wants you to follow up in: 6 months.  You will receive a reminder letter in the mail one-two months in advance.  If you don't receive a letter, please call our office to schedule the follow up appointment.    Any Other Special Instructions Will Be Listed Below (If Applicable).  If you need a refill on your cardiac medications before your next appointment, please call your pharmacy.  

## 2016-05-28 DIAGNOSIS — L0291 Cutaneous abscess, unspecified: Secondary | ICD-10-CM | POA: Diagnosis not present

## 2016-05-28 DIAGNOSIS — Z299 Encounter for prophylactic measures, unspecified: Secondary | ICD-10-CM | POA: Diagnosis not present

## 2016-06-02 DIAGNOSIS — I4891 Unspecified atrial fibrillation: Secondary | ICD-10-CM | POA: Diagnosis not present

## 2016-06-02 DIAGNOSIS — E119 Type 2 diabetes mellitus without complications: Secondary | ICD-10-CM | POA: Diagnosis not present

## 2016-06-03 DIAGNOSIS — H40033 Anatomical narrow angle, bilateral: Secondary | ICD-10-CM | POA: Diagnosis not present

## 2016-06-03 DIAGNOSIS — H1013 Acute atopic conjunctivitis, bilateral: Secondary | ICD-10-CM | POA: Diagnosis not present

## 2016-06-03 DIAGNOSIS — H5711 Ocular pain, right eye: Secondary | ICD-10-CM | POA: Diagnosis not present

## 2016-06-03 DIAGNOSIS — L02411 Cutaneous abscess of right axilla: Secondary | ICD-10-CM | POA: Diagnosis not present

## 2016-06-17 DIAGNOSIS — I4891 Unspecified atrial fibrillation: Secondary | ICD-10-CM | POA: Diagnosis not present

## 2016-06-23 ENCOUNTER — Ambulatory Visit (INDEPENDENT_AMBULATORY_CARE_PROVIDER_SITE_OTHER): Payer: PPO | Admitting: *Deleted

## 2016-06-23 DIAGNOSIS — I255 Ischemic cardiomyopathy: Secondary | ICD-10-CM

## 2016-06-23 NOTE — Progress Notes (Signed)
Remote ICD transmission.   

## 2016-06-24 ENCOUNTER — Encounter: Payer: Self-pay | Admitting: Cardiology

## 2016-06-28 ENCOUNTER — Other Ambulatory Visit: Payer: Self-pay | Admitting: Cardiology

## 2016-06-29 LAB — CUP PACEART REMOTE DEVICE CHECK
Battery Voltage: 2.66 V
Brady Statistic AP VP Percent: 3.17 %
Brady Statistic RA Percent Paced: 3.21 %
Brady Statistic RV Percent Paced: 99.85 %
HighPow Impedance: 361 Ohm
HighPow Impedance: 48 Ohm
HighPow Impedance: 65 Ohm
Implantable Lead Implant Date: 20120620
Implantable Lead Implant Date: 20120620
Implantable Lead Location: 753858
Implantable Lead Location: 753860
Implantable Lead Model: 4196
Implantable Lead Model: 6947
Lead Channel Impedance Value: 418 Ohm
Lead Channel Impedance Value: 456 Ohm
Lead Channel Pacing Threshold Pulse Width: 0.4 ms
Lead Channel Sensing Intrinsic Amplitude: 0.75 mV
Lead Channel Sensing Intrinsic Amplitude: 0.75 mV
Lead Channel Setting Pacing Pulse Width: 0.4 ms
MDC IDC LEAD IMPLANT DT: 20120620
MDC IDC LEAD LOCATION: 753859
MDC IDC MSMT LEADCHNL LV IMPEDANCE VALUE: 1140 Ohm
MDC IDC MSMT LEADCHNL LV IMPEDANCE VALUE: 589 Ohm
MDC IDC MSMT LEADCHNL LV IMPEDANCE VALUE: 722 Ohm
MDC IDC MSMT LEADCHNL LV PACING THRESHOLD AMPLITUDE: 1 V
MDC IDC MSMT LEADCHNL LV PACING THRESHOLD PULSEWIDTH: 0.4 ms
MDC IDC MSMT LEADCHNL RA PACING THRESHOLD AMPLITUDE: 0.75 V
MDC IDC MSMT LEADCHNL RA PACING THRESHOLD PULSEWIDTH: 0.4 ms
MDC IDC MSMT LEADCHNL RV PACING THRESHOLD AMPLITUDE: 0.625 V
MDC IDC MSMT LEADCHNL RV SENSING INTR AMPL: 8.5 mV
MDC IDC MSMT LEADCHNL RV SENSING INTR AMPL: 8.5 mV
MDC IDC SESS DTM: 20170808062828
MDC IDC SET LEADCHNL LV PACING AMPLITUDE: 2 V
MDC IDC SET LEADCHNL LV PACING PULSEWIDTH: 0.4 ms
MDC IDC SET LEADCHNL RA PACING AMPLITUDE: 2 V
MDC IDC SET LEADCHNL RV PACING AMPLITUDE: 2.5 V
MDC IDC SET LEADCHNL RV SENSING SENSITIVITY: 0.3 mV
MDC IDC STAT BRADY AP VS PERCENT: 0.05 %
MDC IDC STAT BRADY AS VP PERCENT: 96.68 %
MDC IDC STAT BRADY AS VS PERCENT: 0.11 %

## 2016-07-09 ENCOUNTER — Other Ambulatory Visit: Payer: Self-pay | Admitting: Cardiology

## 2016-07-21 DIAGNOSIS — M19039 Primary osteoarthritis, unspecified wrist: Secondary | ICD-10-CM | POA: Diagnosis not present

## 2016-07-21 DIAGNOSIS — E559 Vitamin D deficiency, unspecified: Secondary | ICD-10-CM | POA: Diagnosis not present

## 2016-07-21 DIAGNOSIS — Z79899 Other long term (current) drug therapy: Secondary | ICD-10-CM | POA: Diagnosis not present

## 2016-07-21 DIAGNOSIS — N183 Chronic kidney disease, stage 3 (moderate): Secondary | ICD-10-CM | POA: Diagnosis not present

## 2016-07-21 DIAGNOSIS — D509 Iron deficiency anemia, unspecified: Secondary | ICD-10-CM | POA: Diagnosis not present

## 2016-07-21 DIAGNOSIS — I10 Essential (primary) hypertension: Secondary | ICD-10-CM | POA: Diagnosis not present

## 2016-07-21 DIAGNOSIS — I4891 Unspecified atrial fibrillation: Secondary | ICD-10-CM | POA: Diagnosis not present

## 2016-07-21 DIAGNOSIS — I428 Other cardiomyopathies: Secondary | ICD-10-CM | POA: Diagnosis not present

## 2016-07-21 DIAGNOSIS — R809 Proteinuria, unspecified: Secondary | ICD-10-CM | POA: Diagnosis not present

## 2016-07-21 DIAGNOSIS — E1140 Type 2 diabetes mellitus with diabetic neuropathy, unspecified: Secondary | ICD-10-CM | POA: Diagnosis not present

## 2016-07-28 DIAGNOSIS — I10 Essential (primary) hypertension: Secondary | ICD-10-CM | POA: Diagnosis not present

## 2016-07-28 DIAGNOSIS — R809 Proteinuria, unspecified: Secondary | ICD-10-CM | POA: Diagnosis not present

## 2016-07-28 DIAGNOSIS — N183 Chronic kidney disease, stage 3 (moderate): Secondary | ICD-10-CM | POA: Diagnosis not present

## 2016-07-28 DIAGNOSIS — E1129 Type 2 diabetes mellitus with other diabetic kidney complication: Secondary | ICD-10-CM | POA: Diagnosis not present

## 2016-07-30 DIAGNOSIS — I10 Essential (primary) hypertension: Secondary | ICD-10-CM | POA: Diagnosis not present

## 2016-07-30 DIAGNOSIS — E039 Hypothyroidism, unspecified: Secondary | ICD-10-CM | POA: Diagnosis not present

## 2016-07-30 DIAGNOSIS — N189 Chronic kidney disease, unspecified: Secondary | ICD-10-CM | POA: Diagnosis not present

## 2016-07-30 DIAGNOSIS — E1165 Type 2 diabetes mellitus with hyperglycemia: Secondary | ICD-10-CM | POA: Diagnosis not present

## 2016-07-30 DIAGNOSIS — G609 Hereditary and idiopathic neuropathy, unspecified: Secondary | ICD-10-CM | POA: Diagnosis not present

## 2016-08-04 ENCOUNTER — Telehealth: Payer: Self-pay | Admitting: Cardiology

## 2016-08-04 NOTE — Telephone Encounter (Signed)
Thank you. Let's update the chart and also get the most recent lab work from Dr. Kristian Covey.

## 2016-08-04 NOTE — Telephone Encounter (Signed)
[name] called stating that he saw Dr. Kristian Covey approximately one week ago. States that he took him off of his potassium and put him on Calcitriol.  Wanted to let Dr. Diona Browner know this.

## 2016-08-05 ENCOUNTER — Encounter: Payer: Self-pay | Admitting: *Deleted

## 2016-08-05 MED ORDER — CALCITRIOL 0.25 MCG PO CAPS: 0.2500 ug | ORAL_CAPSULE | Freq: Every day | ORAL | Status: DC

## 2016-08-05 NOTE — Telephone Encounter (Signed)
Med list updated & request sent to Dr. Kristian Covey for most recent labs & office note.

## 2016-08-06 DIAGNOSIS — E1165 Type 2 diabetes mellitus with hyperglycemia: Secondary | ICD-10-CM | POA: Diagnosis not present

## 2016-08-12 DIAGNOSIS — Z125 Encounter for screening for malignant neoplasm of prostate: Secondary | ICD-10-CM | POA: Diagnosis not present

## 2016-08-12 DIAGNOSIS — E039 Hypothyroidism, unspecified: Secondary | ICD-10-CM | POA: Diagnosis not present

## 2016-08-12 DIAGNOSIS — Z0000 Encounter for general adult medical examination without abnormal findings: Secondary | ICD-10-CM | POA: Diagnosis not present

## 2016-08-12 DIAGNOSIS — Z6838 Body mass index (BMI) 38.0-38.9, adult: Secondary | ICD-10-CM | POA: Diagnosis not present

## 2016-08-12 DIAGNOSIS — E785 Hyperlipidemia, unspecified: Secondary | ICD-10-CM | POA: Diagnosis not present

## 2016-08-12 DIAGNOSIS — Z79899 Other long term (current) drug therapy: Secondary | ICD-10-CM | POA: Diagnosis not present

## 2016-08-12 DIAGNOSIS — Z1211 Encounter for screening for malignant neoplasm of colon: Secondary | ICD-10-CM | POA: Diagnosis not present

## 2016-08-12 DIAGNOSIS — Z299 Encounter for prophylactic measures, unspecified: Secondary | ICD-10-CM | POA: Diagnosis not present

## 2016-08-12 DIAGNOSIS — Z1389 Encounter for screening for other disorder: Secondary | ICD-10-CM | POA: Diagnosis not present

## 2016-08-12 DIAGNOSIS — Z7189 Other specified counseling: Secondary | ICD-10-CM | POA: Diagnosis not present

## 2016-08-12 DIAGNOSIS — N183 Chronic kidney disease, stage 3 (moderate): Secondary | ICD-10-CM | POA: Diagnosis not present

## 2016-08-12 DIAGNOSIS — I4891 Unspecified atrial fibrillation: Secondary | ICD-10-CM | POA: Diagnosis not present

## 2016-08-12 DIAGNOSIS — E1122 Type 2 diabetes mellitus with diabetic chronic kidney disease: Secondary | ICD-10-CM | POA: Diagnosis not present

## 2016-08-12 DIAGNOSIS — R5383 Other fatigue: Secondary | ICD-10-CM | POA: Diagnosis not present

## 2016-08-23 ENCOUNTER — Other Ambulatory Visit: Payer: Self-pay | Admitting: Cardiology

## 2016-08-25 DIAGNOSIS — E1140 Type 2 diabetes mellitus with diabetic neuropathy, unspecified: Secondary | ICD-10-CM | POA: Diagnosis not present

## 2016-08-25 DIAGNOSIS — I4891 Unspecified atrial fibrillation: Secondary | ICD-10-CM | POA: Diagnosis not present

## 2016-08-25 DIAGNOSIS — Z79899 Other long term (current) drug therapy: Secondary | ICD-10-CM | POA: Diagnosis not present

## 2016-08-25 DIAGNOSIS — I428 Other cardiomyopathies: Secondary | ICD-10-CM | POA: Diagnosis not present

## 2016-08-26 DIAGNOSIS — Z96653 Presence of artificial knee joint, bilateral: Secondary | ICD-10-CM | POA: Diagnosis not present

## 2016-08-26 DIAGNOSIS — G8929 Other chronic pain: Secondary | ICD-10-CM | POA: Diagnosis not present

## 2016-08-26 DIAGNOSIS — M170 Bilateral primary osteoarthritis of knee: Secondary | ICD-10-CM | POA: Diagnosis not present

## 2016-08-26 DIAGNOSIS — M19032 Primary osteoarthritis, left wrist: Secondary | ICD-10-CM | POA: Diagnosis not present

## 2016-08-26 DIAGNOSIS — Z79899 Other long term (current) drug therapy: Secondary | ICD-10-CM | POA: Diagnosis not present

## 2016-08-26 DIAGNOSIS — M19031 Primary osteoarthritis, right wrist: Secondary | ICD-10-CM | POA: Diagnosis not present

## 2016-08-27 DIAGNOSIS — M19049 Primary osteoarthritis, unspecified hand: Secondary | ICD-10-CM | POA: Diagnosis not present

## 2016-08-27 DIAGNOSIS — Z299 Encounter for prophylactic measures, unspecified: Secondary | ICD-10-CM | POA: Diagnosis not present

## 2016-08-27 DIAGNOSIS — Z6837 Body mass index (BMI) 37.0-37.9, adult: Secondary | ICD-10-CM | POA: Diagnosis not present

## 2016-08-27 DIAGNOSIS — Z713 Dietary counseling and surveillance: Secondary | ICD-10-CM | POA: Diagnosis not present

## 2016-09-17 ENCOUNTER — Encounter: Payer: Self-pay | Admitting: *Deleted

## 2016-09-18 ENCOUNTER — Encounter: Payer: Self-pay | Admitting: Internal Medicine

## 2016-09-18 ENCOUNTER — Ambulatory Visit (INDEPENDENT_AMBULATORY_CARE_PROVIDER_SITE_OTHER): Payer: PPO | Admitting: Internal Medicine

## 2016-09-18 VITALS — BP 143/73 | HR 61 | Ht 68.0 in | Wt 248.4 lb

## 2016-09-18 DIAGNOSIS — I255 Ischemic cardiomyopathy: Secondary | ICD-10-CM | POA: Diagnosis not present

## 2016-09-18 DIAGNOSIS — I480 Paroxysmal atrial fibrillation: Secondary | ICD-10-CM

## 2016-09-18 DIAGNOSIS — I10 Essential (primary) hypertension: Secondary | ICD-10-CM | POA: Diagnosis not present

## 2016-09-18 DIAGNOSIS — E1122 Type 2 diabetes mellitus with diabetic chronic kidney disease: Secondary | ICD-10-CM | POA: Diagnosis not present

## 2016-09-18 DIAGNOSIS — Z299 Encounter for prophylactic measures, unspecified: Secondary | ICD-10-CM | POA: Diagnosis not present

## 2016-09-18 DIAGNOSIS — Z6837 Body mass index (BMI) 37.0-37.9, adult: Secondary | ICD-10-CM | POA: Diagnosis not present

## 2016-09-18 DIAGNOSIS — I4891 Unspecified atrial fibrillation: Secondary | ICD-10-CM | POA: Diagnosis not present

## 2016-09-18 DIAGNOSIS — N183 Chronic kidney disease, stage 3 (moderate): Secondary | ICD-10-CM | POA: Diagnosis not present

## 2016-09-18 DIAGNOSIS — I5022 Chronic systolic (congestive) heart failure: Secondary | ICD-10-CM

## 2016-09-18 DIAGNOSIS — Z95810 Presence of automatic (implantable) cardiac defibrillator: Secondary | ICD-10-CM

## 2016-09-18 LAB — CUP PACEART INCLINIC DEVICE CHECK
Brady Statistic AP VP Percent: 3.72 %
Brady Statistic AS VS Percent: 0.17 %
Brady Statistic RV Percent Paced: 99.06 %
HIGH POWER IMPEDANCE MEASURED VALUE: 361 Ohm
HighPow Impedance: 48 Ohm
HighPow Impedance: 61 Ohm
Implantable Lead Implant Date: 20120620
Implantable Lead Location: 753859
Implantable Lead Location: 753860
Implantable Lead Model: 5076
Lead Channel Impedance Value: 1026 Ohm
Lead Channel Impedance Value: 399 Ohm
Lead Channel Impedance Value: 532 Ohm
Lead Channel Impedance Value: 608 Ohm
Lead Channel Pacing Threshold Amplitude: 0.5 V
Lead Channel Pacing Threshold Amplitude: 0.75 V
Lead Channel Pacing Threshold Pulse Width: 0.4 ms
Lead Channel Sensing Intrinsic Amplitude: 0.875 mV
Lead Channel Sensing Intrinsic Amplitude: 1.25 mV
Lead Channel Setting Pacing Amplitude: 2 V
Lead Channel Setting Pacing Pulse Width: 0.4 ms
Lead Channel Setting Sensing Sensitivity: 0.3 mV
MDC IDC LEAD IMPLANT DT: 20120620
MDC IDC LEAD IMPLANT DT: 20120620
MDC IDC LEAD LOCATION: 753858
MDC IDC LEAD MODEL: 4196
MDC IDC MSMT BATTERY VOLTAGE: 2.63 V
MDC IDC MSMT LEADCHNL LV PACING THRESHOLD AMPLITUDE: 0.75 V
MDC IDC MSMT LEADCHNL LV PACING THRESHOLD PULSEWIDTH: 0.4 ms
MDC IDC MSMT LEADCHNL RA IMPEDANCE VALUE: 456 Ohm
MDC IDC MSMT LEADCHNL RV PACING THRESHOLD PULSEWIDTH: 0.4 ms
MDC IDC MSMT LEADCHNL RV SENSING INTR AMPL: 8.375 mV
MDC IDC MSMT LEADCHNL RV SENSING INTR AMPL: 8.5 mV
MDC IDC PG IMPLANT DT: 20120620
MDC IDC SESS DTM: 20171103121719
MDC IDC SET LEADCHNL LV PACING AMPLITUDE: 2 V
MDC IDC SET LEADCHNL LV PACING PULSEWIDTH: 0.4 ms
MDC IDC SET LEADCHNL RV PACING AMPLITUDE: 2.5 V
MDC IDC STAT BRADY AP VS PERCENT: 0.08 %
MDC IDC STAT BRADY AS VP PERCENT: 96.02 %
MDC IDC STAT BRADY RA PERCENT PACED: 3.79 %

## 2016-09-18 NOTE — Progress Notes (Signed)
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 male who presents today for routine electrophysiology followup. He remains active.  He still goes dancing three nights per week.  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  Today, he denies symptoms of palpitations, chest pain, shortness of breath,  lower extremity edema, dizziness, presyncope, syncope, or ICD shocks.  The patient is otherwise without complaint today.  He says that his renal function is stable.   Past Medical History:  Diagnosis Date  . Anemia   . Anemia-chronic    a. Pt reports h/o anemia - was offered IV iron in past by nephrologist but declined and has taken PO instead.  . Aneurysm, cerebral   . BPH (benign prostatic hypertrophy)   . Cardiomyopathy, ischemic    LVEF 25-35%.  . Chronic kidney disease, stage III (moderate)    Followed by Dr. Fausto Skillern.  . Coronary atherosclerosis of native coronary artery    a. DES to RCA 03/2009. b. s/p PTCA to RCA for ISR, 05/2010. c. 03/2013 NSTEMI 2/2 severe prox RCA s/p PTCA/DES, med rx for residual dz.  . Essential hypertension, benign   . Hyperlipidemia   . LBBB (left bundle branch block)    s/p BiV ICD Implanted by Dr Graciela Husbands  . Morbid obesity (HCC)   . MRSA (methicillin resistant Staphylococcus aureus)   . Myocardial infarction   . Paroxysmal atrial fibrillation (HCC)    a. Coumadin discontinue in 2010 when the patient required ASA/Plavix for stenting. b. Coumadin restarted 05/2013, Plavix continued, ASA stopped.   . Pulmonary nodule    CXR, 03/2013, [facility] - not described on any subsequent chest x-rays however, could be a vascular structure  . TIA (transient ischemic attack)    Multiple TIAs  . Type 2 diabetes mellitus (HCC)    Past Surgical History:  Procedure Laterality Date  . CHOLECYSTECTOMY    . ICD placement  May 06, 2011   Medtronic Protecta XT CRT-D  . LEFT HEART CATHETERIZATION WITH CORONARY ANGIOGRAM N/A 04/17/2013   Procedure:  LEFT HEART CATHETERIZATION WITH CORONARY ANGIOGRAM;  Surgeon: Vesta Mixer, MD;  Location: [facility] CATH LAB;  Service: Cardiovascular;  Laterality: N/A;  . LUNG SURGERY     23 - 24 yrs. old  . TOTAL KNEE ARTHROPLASTY    . VASECTOMY      Current Outpatient Prescriptions  Medication Sig Dispense Refill  . acetaminophen (TYLENOL) 500 MG tablet Take 1 tablet (500 mg total) by mouth every 6 (six) hours as needed. 30 tablet 0  . calcitRIOL (ROCALTROL) 0.25 MCG capsule Take 1 capsule (0.25 mcg total) by mouth daily.    . carvedilol (COREG) 12.5 MG tablet TAKE ONE AND ONE-HALF TABLETS BY MOUTH TWICE DAILY WITH MEALS (DUE FOR FOLLOW UP) 270 tablet 3  . cholecalciferol (VITAMIN D) 1000 UNITS tablet Take 1,000 Units by mouth daily.    . clopidogrel (PLAVIX) 75 MG tablet TAKE ONE TABLET BY MOUTH ONCE DAILY 30 tablet 6  . gabapentin (NEURONTIN) 100 MG capsule Take 100 mg by mouth 2 (two) times daily.    . hydrALAZINE (APRESOLINE) 25 MG tablet TAKE ONE TABLET BY MOUTH THREE TIMES DAILY 270 tablet 3  . HYDROcodone-acetaminophen (NORCO) 10-325 MG per tablet Take 1 tablet by mouth every 8 (eight) hours as needed for pain.     Marland Kitchen insulin lispro protamine-insulin lispro (HUMALOG 75/25) (75-25) 100 UNIT/ML SUSP Inject 15-25 Units into the skin 3 (three)  times daily after meals. Will inject between 15 and 25 units each time blood sugar level is checked.    . isosorbide mononitrate (IMDUR) 30 MG 24 hr tablet TAKE ONE TABLET BY MOUTH ONCE DAILY 30 tablet 6  . L-Methylfolate-Algae-B12-B6 (METANX) 3-90.314-2-35 MG CAPS TAKE ONE CAPSULE BY MOUTH ONCE DAILY 60 capsule 3  . l-methylfolate-B6-B12 (METANX) 3-35-2 MG TABS Take 1 tablet by mouth daily. 30 tablet 0  . levothyroxine (SYNTHROID, LEVOTHROID) 25 MCG tablet Take 1 tablet by mouth daily.    Marland Kitchen linagliptin (TRADJENTA) 5 MG TABS tablet Take 5 mg by mouth daily.    Marland Kitchen lisinopril (PRINIVIL,ZESTRIL) 5 MG tablet Take 2.5 mg by mouth daily.     . nitroGLYCERIN (NITROSTAT) 0.4  MG SL tablet Place 0.4 mg under the tongue every 5 (five) minutes as needed for chest pain. For chest pain    . Omega-3 Fatty Acids (FISH OIL) 1000 MG CAPS Take 1,000 mg by mouth daily.    . Polysaccharide Iron Complex (FERREX 150 PO) Take 150 mg by mouth.    . torsemide (DEMADEX) 10 MG tablet Take 1 tablet (10 mg total) by mouth 2 (two) times daily. 60 tablet 3  . warfarin (COUMADIN) 5 MG tablet Take 2.5-5 mg by mouth daily at 6 PM. Takes 2.5mg  on mon only Takes 5mg  all other days    . Zinc 50 MG CAPS Take 50 mg by mouth daily.      No current facility-administered medications for this visit.     Physical Exam: Vitals:   09/18/16 1136  BP: (!) 143/73  Pulse: 61  SpO2: 93%  Weight: 248 lb 6.4 oz (112.7 kg)  Height: 5\' 8"  (1.727 m)    GEN- The patient is overweight appearing, alert and oriented x 3 today.   Head- normocephalic, atraumatic Eyes-  Sclera clear, conjunctiva pink Ears- hearing intact Oropharynx- clear Lungs- Clear to ausculation bilaterally, normal work of breathing Chest- ICD pocket is well healed Heart- Regular rate and rhythm, no murmurs, rubs or gallops, PMI not laterally displaced GI- soft, NT, ND, + BS Extremities- no clubbing, cyanosis, or edema  ICD interrogation- reviewed in detail today,  See PACEART report  Assessment and Plan:  1.  Chronic systolic dysfunction euvolemic today Stable on an appropriate medical regimen Normal BiVICD function See Pace Art report No changes today Good response to CRT. He is approaching ERI.  Auditory tone played for patient today and he can hear this.  He is compliant with remotes. Risks, benefits, and alternatives to BiVICD pulse generator replacement were discussed in detail today.  The patient understands that risks include but are not limited to bleeding, infection, pneumothorax, perforation, tamponade, vascular damage, renal failure, MI, stroke, death, inappropriate shocks, damage to his existing leads, and lead  dislodgement and wishes to proceed once ERI.  I did discuss possibility of downgrade to CRT-P.  He is clear that he would favor keeping a defibrillator.   Once ERI, ok to schedule for generator change without an additional office visit.  Would hold coumadin for 48 hours prior to the procedure and hold plavix for 5 days prior if possible.  2. Hypertensive cardiovascular disease Stable No change required today  3. CAD Stable No change required today As he is on coumadin, would be nice to stop plavix if possible.  Will defer to Dr 13/03/17  4. Afib Continue long term anticoagualtion Maintaining sinus  carelink Return in 6 months if he has not reached ERI prior to this.  Hillis Range MD, [facility] 09/18/2016 12:24 PM

## 2016-09-18 NOTE — Patient Instructions (Signed)
Medication Instructions:  Continue all current medications.  Labwork: none  Testing/Procedures: none  Follow-Up: Your physician wants you to follow up in: 6 months.  You will receive a reminder letter in the mail one-two months in advance.  If you don't receive a letter, please call our office to schedule the follow up appointment - Dr. Johney Frame.   Any Other Special Instructions Will Be Listed Below (If Applicable). Remote monitoring is used to monitor your Pacemaker of ICD from home. This monitoring reduces the number of office visits required to check your device to one time per year. It allows [place] to keep an eye on the functioning of your device to ensure it is working properly. You are scheduled for a device check from home on 10/19/2016. You may send your transmission at any time that day. If you have a wireless device, the transmission will be sent automatically. After your physician reviews your transmission, you will receive a postcard with your next transmission date.  If you need a refill on your cardiac medications before your next appointment, please call your pharmacy.

## 2016-10-04 ENCOUNTER — Other Ambulatory Visit: Payer: Self-pay | Admitting: Cardiology

## 2016-10-06 DIAGNOSIS — M791 Myalgia: Secondary | ICD-10-CM | POA: Diagnosis not present

## 2016-10-06 DIAGNOSIS — M47817 Spondylosis without myelopathy or radiculopathy, lumbosacral region: Secondary | ICD-10-CM | POA: Diagnosis not present

## 2016-10-06 DIAGNOSIS — M533 Sacrococcygeal disorders, not elsewhere classified: Secondary | ICD-10-CM | POA: Diagnosis not present

## 2016-10-06 DIAGNOSIS — M5416 Radiculopathy, lumbar region: Secondary | ICD-10-CM | POA: Diagnosis not present

## 2016-10-19 ENCOUNTER — Telehealth: Payer: Self-pay | Admitting: Cardiology

## 2016-10-19 ENCOUNTER — Encounter: Payer: PPO | Admitting: *Deleted

## 2016-10-19 DIAGNOSIS — Z79899 Other long term (current) drug therapy: Secondary | ICD-10-CM | POA: Diagnosis not present

## 2016-10-19 DIAGNOSIS — R809 Proteinuria, unspecified: Secondary | ICD-10-CM | POA: Diagnosis not present

## 2016-10-19 DIAGNOSIS — E559 Vitamin D deficiency, unspecified: Secondary | ICD-10-CM | POA: Diagnosis not present

## 2016-10-19 DIAGNOSIS — N183 Chronic kidney disease, stage 3 (moderate): Secondary | ICD-10-CM | POA: Diagnosis not present

## 2016-10-19 DIAGNOSIS — I10 Essential (primary) hypertension: Secondary | ICD-10-CM | POA: Diagnosis not present

## 2016-10-19 DIAGNOSIS — D509 Iron deficiency anemia, unspecified: Secondary | ICD-10-CM | POA: Diagnosis not present

## 2016-10-19 NOTE — Telephone Encounter (Signed)
Spoke with pt and reminded pt of remote transmission that is due today. Pt verbalized understanding.   

## 2016-10-20 ENCOUNTER — Other Ambulatory Visit: Payer: Self-pay | Admitting: Cardiology

## 2016-10-20 DIAGNOSIS — M19039 Primary osteoarthritis, unspecified wrist: Secondary | ICD-10-CM | POA: Diagnosis not present

## 2016-10-20 DIAGNOSIS — I4891 Unspecified atrial fibrillation: Secondary | ICD-10-CM | POA: Diagnosis not present

## 2016-10-20 DIAGNOSIS — Z299 Encounter for prophylactic measures, unspecified: Secondary | ICD-10-CM | POA: Diagnosis not present

## 2016-10-20 DIAGNOSIS — M25529 Pain in unspecified elbow: Secondary | ICD-10-CM | POA: Diagnosis not present

## 2016-10-27 DIAGNOSIS — N183 Chronic kidney disease, stage 3 (moderate): Secondary | ICD-10-CM | POA: Diagnosis not present

## 2016-10-27 DIAGNOSIS — I10 Essential (primary) hypertension: Secondary | ICD-10-CM | POA: Diagnosis not present

## 2016-10-27 DIAGNOSIS — I509 Heart failure, unspecified: Secondary | ICD-10-CM | POA: Diagnosis not present

## 2016-10-27 DIAGNOSIS — R809 Proteinuria, unspecified: Secondary | ICD-10-CM | POA: Diagnosis not present

## 2016-10-27 DIAGNOSIS — D649 Anemia, unspecified: Secondary | ICD-10-CM | POA: Diagnosis not present

## 2016-10-28 DIAGNOSIS — I4891 Unspecified atrial fibrillation: Secondary | ICD-10-CM | POA: Diagnosis not present

## 2016-10-28 DIAGNOSIS — E119 Type 2 diabetes mellitus without complications: Secondary | ICD-10-CM | POA: Diagnosis not present

## 2016-10-30 ENCOUNTER — Encounter: Payer: Self-pay | Admitting: Cardiology

## 2016-11-03 DIAGNOSIS — M47817 Spondylosis without myelopathy or radiculopathy, lumbosacral region: Secondary | ICD-10-CM | POA: Diagnosis not present

## 2016-11-03 DIAGNOSIS — M533 Sacrococcygeal disorders, not elsewhere classified: Secondary | ICD-10-CM | POA: Diagnosis not present

## 2016-11-03 DIAGNOSIS — M791 Myalgia: Secondary | ICD-10-CM | POA: Diagnosis not present

## 2016-11-03 DIAGNOSIS — M5416 Radiculopathy, lumbar region: Secondary | ICD-10-CM | POA: Diagnosis not present

## 2016-11-06 ENCOUNTER — Ambulatory Visit (INDEPENDENT_AMBULATORY_CARE_PROVIDER_SITE_OTHER): Payer: PPO | Admitting: *Deleted

## 2016-11-06 DIAGNOSIS — Z95810 Presence of automatic (implantable) cardiac defibrillator: Secondary | ICD-10-CM

## 2016-11-10 NOTE — Progress Notes (Signed)
Remote ICD transmission.   

## 2016-11-11 ENCOUNTER — Encounter: Payer: Self-pay | Admitting: Cardiology

## 2016-11-13 LAB — CUP PACEART REMOTE DEVICE CHECK
Battery Voltage: 2.63 V
Brady Statistic RA Percent Paced: 4.29 %
Brady Statistic RV Percent Paced: 98.51 %
HIGH POWER IMPEDANCE MEASURED VALUE: 58 Ohm
HighPow Impedance: 342 Ohm
HighPow Impedance: 46 Ohm
Implantable Lead Implant Date: 20120620
Implantable Lead Implant Date: 20120620
Implantable Lead Location: 753858
Implantable Lead Model: 4196
Implantable Lead Model: 5076
Implantable Pulse Generator Implant Date: 20120620
Lead Channel Impedance Value: 1064 Ohm
Lead Channel Impedance Value: 456 Ohm
Lead Channel Impedance Value: 532 Ohm
Lead Channel Impedance Value: 646 Ohm
Lead Channel Pacing Threshold Amplitude: 0.625 V
Lead Channel Pacing Threshold Pulse Width: 0.4 ms
Lead Channel Pacing Threshold Pulse Width: 0.4 ms
Lead Channel Pacing Threshold Pulse Width: 0.4 ms
Lead Channel Sensing Intrinsic Amplitude: 0.875 mV
Lead Channel Sensing Intrinsic Amplitude: 7.75 mV
Lead Channel Setting Pacing Amplitude: 2 V
Lead Channel Setting Pacing Pulse Width: 0.4 ms
Lead Channel Setting Sensing Sensitivity: 0.3 mV
MDC IDC LEAD IMPLANT DT: 20120620
MDC IDC LEAD LOCATION: 753859
MDC IDC LEAD LOCATION: 753860
MDC IDC MSMT LEADCHNL LV PACING THRESHOLD AMPLITUDE: 0.75 V
MDC IDC MSMT LEADCHNL RA SENSING INTR AMPL: 0.875 mV
MDC IDC MSMT LEADCHNL RV IMPEDANCE VALUE: 418 Ohm
MDC IDC MSMT LEADCHNL RV PACING THRESHOLD AMPLITUDE: 0.5 V
MDC IDC MSMT LEADCHNL RV SENSING INTR AMPL: 7.75 mV
MDC IDC SESS DTM: 20171223011126
MDC IDC SET LEADCHNL LV PACING AMPLITUDE: 1.75 V
MDC IDC SET LEADCHNL LV PACING PULSEWIDTH: 0.4 ms
MDC IDC SET LEADCHNL RV PACING AMPLITUDE: 2.5 V
MDC IDC STAT BRADY AP VP PERCENT: 4.19 %
MDC IDC STAT BRADY AP VS PERCENT: 0.12 %
MDC IDC STAT BRADY AS VP PERCENT: 95.51 %
MDC IDC STAT BRADY AS VS PERCENT: 0.18 %

## 2016-11-15 ENCOUNTER — Emergency Department (HOSPITAL_COMMUNITY)
Admission: EM | Admit: 2016-11-15 | Discharge: 2016-11-15 | Disposition: A | Discharge status: Home / Self Care | Payer: PPO | Attending: Emergency Medicine | Admitting: Emergency Medicine

## 2016-11-15 ENCOUNTER — Encounter (HOSPITAL_COMMUNITY): Payer: Self-pay | Admitting: *Deleted

## 2016-11-15 DIAGNOSIS — N183 Chronic kidney disease, stage 3 (moderate): Secondary | ICD-10-CM | POA: Insufficient documentation

## 2016-11-15 DIAGNOSIS — L0291 Cutaneous abscess, unspecified: Secondary | ICD-10-CM

## 2016-11-15 DIAGNOSIS — H6001 Abscess of right external ear: Secondary | ICD-10-CM | POA: Diagnosis not present

## 2016-11-15 DIAGNOSIS — I2510 Atherosclerotic heart disease of native coronary artery without angina pectoris: Secondary | ICD-10-CM | POA: Diagnosis not present

## 2016-11-15 DIAGNOSIS — Z87891 Personal history of nicotine dependence: Secondary | ICD-10-CM | POA: Diagnosis not present

## 2016-11-15 DIAGNOSIS — I5022 Chronic systolic (congestive) heart failure: Secondary | ICD-10-CM | POA: Insufficient documentation

## 2016-11-15 DIAGNOSIS — I130 Hypertensive heart and chronic kidney disease with heart failure and stage 1 through stage 4 chronic kidney disease, or unspecified chronic kidney disease: Secondary | ICD-10-CM | POA: Diagnosis not present

## 2016-11-15 DIAGNOSIS — Z7901 Long term (current) use of anticoagulants: Secondary | ICD-10-CM | POA: Insufficient documentation

## 2016-11-15 DIAGNOSIS — E1122 Type 2 diabetes mellitus with diabetic chronic kidney disease: Secondary | ICD-10-CM | POA: Diagnosis not present

## 2016-11-15 DIAGNOSIS — Z79899 Other long term (current) drug therapy: Secondary | ICD-10-CM | POA: Insufficient documentation

## 2016-11-15 MED ORDER — DOXYCYCLINE HYCLATE 100 MG PO TABS
100.0000 mg | ORAL_TABLET | Freq: Once | ORAL | Status: AC
  Administered 2016-11-15: 100 mg via ORAL
  Filled 2016-11-15: qty 1

## 2016-11-15 MED ORDER — DOXYCYCLINE HYCLATE 100 MG PO CAPS: 100.0000 mg | ORAL_CAPSULE | Freq: Two times a day (BID) | ORAL | 0 refills | Status: DC

## 2016-11-15 NOTE — ED Provider Notes (Signed)
[facility]-EMERGENCY DEPT Provider Note   CSN: [id number] Arrival date & time: 11/15/16  2205  By signing my name below, I, Soijett Blue, attest that this documentation has been prepared under the direction and in the presence of Vanetta Mulders, MD. Electronically Signed: Soijett Blue, ED Scribe. 11/15/16. 10:47 PM.  History   Chief Complaint Chief Complaint  Patient presents with  . Abscess    HPI [name] is a 75 y.o. male with a PMHx of MRSA, type II DM, HTN, who presents to the Emergency Department complaining of abscess to right ear onset 5 days ago. Pt is having associated symptoms of bruising/bleeding easily due to taking daily plavix and coumadin. He states that he has tried an old Rx clindamycin x 3 days ago with no relief of his symptoms. Pt was prescribed clindamycin by Dr. Sherryll Burger at [facility]. Pt denies, fever, chills, drainage, cough, congestion, rhinorrhea, sore throat, vision changes, CP, SOB, abdominal pain, nausea, vomiting, diarrhea, dysuria, hematuria, rash, back pain, HA, confusion, and any other symptoms.    The history is provided by the spouse and the patient. No language interpreter was used.  Abscess  Location:  Head/neck Head/neck abscess location:  R ear (behind) Abscess quality: painful   Abscess quality: not draining   Red streaking: no   Duration:  5 days Progression:  Unchanged Pain details:    Quality:  Throbbing   Severity:  Mild   Duration:  5 days   Timing:  Sporadic   Progression:  Unchanged Chronicity:  Recurrent Context: diabetes   Relieved by:  Nothing Ineffective treatments:  Oral antibiotics (clindamycin) Associated symptoms: no fever, no headaches, no nausea and no vomiting   Risk factors: hx of MRSA     Past Medical History:  Diagnosis Date  . Anemia   . Anemia-chronic    a. Pt reports h/o anemia - was offered IV iron in past by nephrologist but declined and has taken PO instead.  . Aneurysm, cerebral   . BPH  (benign prostatic hypertrophy)   . Cardiomyopathy, ischemic    LVEF 25-35%.  . Chronic kidney disease, stage III (moderate)    Followed by Dr. Fausto Skillern.  . Coronary atherosclerosis of native coronary artery    a. DES to RCA 03/2009. b. s/p PTCA to RCA for ISR, 05/2010. c. 03/2013 NSTEMI 2/2 severe prox RCA s/p PTCA/DES, med rx for residual dz.  . Essential hypertension, benign   . Hyperlipidemia   . LBBB (left bundle branch block)    s/p BiV ICD Implanted by Dr Graciela Husbands  . Morbid obesity (HCC)   . MRSA (methicillin resistant Staphylococcus aureus)   . Myocardial infarction   . Paroxysmal atrial fibrillation (HCC)    a. Coumadin discontinue in 2010 when the patient required ASA/Plavix for stenting. b. Coumadin restarted 05/2013, Plavix continued, ASA stopped.   . Pulmonary nodule    CXR, 03/2013, [facility] - not described on any subsequent chest x-rays however, could be a vascular structure  . TIA (transient ischemic attack)    Multiple TIAs  . Type 2 diabetes mellitus [facility])     Patient Active Problem List   Diagnosis Date Noted  . PVD (peripheral vascular disease) with claudication (HCC) 07/11/2014  . TIA (transient ischemic attack) 05/30/2013  . Long term (current) use of anticoagulants 05/30/2013  . CKD (chronic kidney disease) stage 3, GFR 30-59 ml/min 05/29/2013  . Paroxysmal atrial fibrillation (HCC)   . Cardiomyopathy, ischemic   . Biventricular ICD (implantable cardioverter-defibrillator)  in place 08/17/2012  . DM 05/15/2010  . Hypertensive cardiovascular disease 04/25/2010  . HYPERLIPIDEMIA-MIXED 08/28/2009  . CAD, NATIVE VESSEL 08/28/2009  . LBBB 08/28/2009  . SYSTOLIC HEART FAILURE, CHRONIC 08/28/2009    Past Surgical History:  Procedure Laterality Date  . CHOLECYSTECTOMY    . ICD placement  May 06, 2011   Medtronic Protecta XT CRT-D  . LEFT HEART CATHETERIZATION WITH CORONARY ANGIOGRAM N/A 04/17/2013   Procedure: LEFT HEART CATHETERIZATION WITH CORONARY ANGIOGRAM;  Surgeon:  Vesta Mixer, MD;  Location: [facility] CATH LAB;  Service: Cardiovascular;  Laterality: N/A;  . LUNG SURGERY     23 - 24 yrs. old  . TOTAL KNEE ARTHROPLASTY    . VASECTOMY      OB History    No data available       Home Medications    Prior to Admission medications   Medication Sig Start Date End Date Taking? Authorizing Provider  acetaminophen (TYLENOL) 500 MG tablet Take 1 tablet (500 mg total) by mouth every 6 (six) hours as needed. 05/03/15   Joycie Peek, PA-C  calcitRIOL (ROCALTROL) 0.25 MCG capsule Take 1 capsule (0.25 mcg total) by mouth daily. 08/05/16   Jonelle Sidle, MD  carvedilol (COREG) 12.5 MG tablet TAKE ONE AND ONE-HALF TABLETS BY MOUTH TWICE DAILY WITH MEALS (DUE FOR FOLLOW UP) 06/29/16   Jonelle Sidle, MD  cholecalciferol (VITAMIN D) 1000 UNITS tablet Take 1,000 Units by mouth daily.    Historical Provider, MD  clopidogrel (PLAVIX) 75 MG tablet TAKE ONE TABLET BY MOUTH ONCE DAILY 08/24/16   Jonelle Sidle, MD  doxycycline (VIBRAMYCIN) 100 MG capsule Take 1 capsule (100 mg total) by mouth 2 (two) times daily. 11/15/16   Vanetta Mulders, MD  gabapentin (NEURONTIN) 100 MG capsule Take 100 mg by mouth 2 (two) times daily.    Historical Provider, MD  hydrALAZINE (APRESOLINE) 25 MG tablet TAKE ONE TABLET BY MOUTH THREE TIMES DAILY 07/09/16   Jonelle Sidle, MD  HYDROcodone-acetaminophen [facility]) 10-325 MG per tablet Take 1 tablet by mouth every 8 (eight) hours as needed for pain.  05/15/13   Historical Provider, MD  insulin lispro protamine-insulin lispro (HUMALOG 75/25) (75-25) 100 UNIT/ML SUSP Inject 15-25 Units into the skin 3 (three) times daily after meals. Will inject between 15 and 25 units each time blood sugar level is checked.    Historical Provider, MD  isosorbide mononitrate (IMDUR) 30 MG 24 hr tablet TAKE ONE TABLET BY MOUTH ONCE DAILY 10/20/16   Jonelle Sidle, MD  L-Methylfolate-Algae-B12-B6 [facility]) 3-90.314-2-35 MG CAPS TAKE ONE CAPSULE BY MOUTH ONCE  DAILY 06/13/15   Laqueta Linden, MD  l-methylfolate-B6-B12 (METANX) 3-35-2 MG TABS Take 1 tablet by mouth daily. 05/02/15   Jonelle Sidle, MD  levothyroxine (SYNTHROID, LEVOTHROID) 25 MCG tablet Take 1 tablet by mouth daily. 08/13/14   Historical Provider, MD  linagliptin (TRADJENTA) 5 MG TABS tablet Take 5 mg by mouth daily.    Historical Provider, MD  lisinopril (PRINIVIL,ZESTRIL) 5 MG tablet Take 2.5 mg by mouth daily.  09/18/13   Historical Provider, MD  nitroGLYCERIN (NITROSTAT) 0.4 MG SL tablet Place 0.4 mg under the tongue every 5 (five) minutes as needed for chest pain. For chest pain 04/26/13   Rande Brunt, PA-C  Omega-3 Fatty Acids (FISH OIL) 1000 MG CAPS Take 1,000 mg by mouth daily.    Historical Provider, MD  Polysaccharide Iron Complex (FERREX 150 PO) Take 150 mg by mouth.  Historical Provider, MD  torsemide (DEMADEX) 10 MG tablet Take 1 tablet (10 mg total) by mouth 2 (two) times daily. 12/30/15   Jonelle Sidle, MD  torsemide (DEMADEX) 10 MG tablet TAKE ONE TABLET BY MOUTH TWICE DAILY 10/05/16   Jonelle Sidle, MD  warfarin (COUMADIN) 5 MG tablet Take 2.5-5 mg by mouth daily at 6 PM. Takes 2.5mg  on mon only Takes 5mg  all other days    Historical Provider, MD  Zinc 50 MG CAPS Take 50 mg by mouth daily.     Historical Provider, MD    Family History Family History  Problem Relation Age of Onset  . Diabetes Mother     Bilateral leg amputation  . Heart disease Mother     Before age 72  . Hypertension Mother   . Cancer Father     Prostate and Bone  . Diabetes Father   . Cancer Brother     Prostate  . Heart disease Other     family h/o premature cardiovascular disease  . Cancer Sister     Colon   . Hypertension Sister     Social History Social History  Substance Use Topics  . Smoking status: Former Smoker    Packs/day: 1.00    Years: 6.00    Types: Cigarettes    Start date: 12/08/1949    Quit date: 11/16/1964  . Smokeless tobacco: Former 01/14/1965    Quit  date: 04/15/1965  . Alcohol use 0.0 oz/week     Comment: once/month     Allergies   Codeine   Review of Systems Review of Systems  Constitutional: Negative for chills and fever.  HENT: Negative for rhinorrhea and sore throat.   Eyes: Negative for visual disturbance.  Respiratory: Negative for cough and shortness of breath.   Cardiovascular: Negative for chest pain.  Gastrointestinal: Negative for abdominal pain, nausea and vomiting.  Genitourinary: Negative for dysuria and hematuria.  Musculoskeletal: Negative for joint swelling.  Skin: Negative for rash.       Abscess behind right ear without drainage  Neurological: Negative for weakness, numbness and headaches.  Hematological: Bruises/bleeds easily.  Psychiatric/Behavioral: Negative for confusion.     Physical Exam Updated Vital Signs BP 176/83   Pulse 60   Temp 97.9 F (36.6 C)   Resp 20   Ht 5\' 8"  (1.727 m)   Wt 110.7 kg   SpO2 96%   BMI 37.10 kg/m   Physical Exam  Constitutional: He is oriented to person, place, and time. He appears well-developed and well-nourished. No distress.  HENT:  Head: Normocephalic.  Mouth/Throat: Oropharynx is clear and moist.  5 mm scabbed lesion to right temporal area that is healing. Area of induration behind right ear that measures about 3 cm without fluctuance. 3-4 superficial pustules noted to the area.   Eyes: Conjunctivae and EOM are normal. No scleral icterus.  sclera clear  Neck: Neck supple.  Cardiovascular: Normal rate, regular rhythm and normal heart sounds.  Exam reveals no gallop and no friction rub.   No murmur heard. Pulmonary/Chest: Effort normal and breath sounds normal. No respiratory distress. He has no wheezes. He has no rales.  Abdominal: Soft. There is no tenderness.  Musculoskeletal:  No swelling to bilateral ankles.  Lymphadenopathy:  Enlarged lymphnode to the angle of the right jaw  Neurological: He is alert and oriented to person, place, and time. No  cranial nerve deficit. He exhibits normal muscle tone. Coordination normal.  Skin: Skin is warm and  dry. Lesion and rash noted. Rash is pustular. He is not diaphoretic.  Psychiatric: He has a normal mood and affect. His behavior is normal.  Nursing note and vitals reviewed.    ED Treatments / Results  DIAGNOSTIC STUDIES: Oxygen Saturation is 96% on RA, nl by my interpretation.    COORDINATION OF CARE: 10:40 PM Discussed treatment plan with pt at bedside which includes doxycycline Rx, warm compresses, and pt agreed to plan.  Procedures Procedures (including critical care time)  Medications Ordered in ED Medications  doxycycline (VIBRA-TABS) tablet 100 mg (not administered)     Initial Impression / Assessment and Plan / ED Course  I have reviewed the triage vital signs and the nursing notes.  Clinical Course     Patient with superficial skin infection behind the right ear. No deep space abscess. Has been on clindamycin. Will switch to doxycycline. Patient aware this could increase his Coumadin level. He will call his primary care doctor for follow-up. Patient nontoxic no acute distress. No indication for incision and drainage.  Final Clinical Impressions(s) / ED Diagnoses   Final diagnoses:  Abscess    New Prescriptions New Prescriptions   DOXYCYCLINE (VIBRAMYCIN) 100 MG CAPSULE    Take 1 capsule (100 mg total) by mouth 2 (two) times daily.   I personally performed the services described in this documentation, which was scribed in my presence. The recorded information has been reviewed and is accurate.       Vanetta Mulders, MD 11/15/16 [phone]

## 2016-11-15 NOTE — ED Notes (Signed)
Dr. Z at bedside.

## 2016-11-15 NOTE — ED Triage Notes (Signed)
Pt reports having a bump being his right ear. Pt states he has been told he has MRSA. Pt states he has these quiet often. Pt has been taking some old clindamycin.

## 2016-11-15 NOTE — ED Notes (Signed)
Dr Herma Carson has completed exam

## 2016-11-15 NOTE — Discharge Instructions (Signed)
As we discussed warm compresses over the abscess behind the right ear. Take the doxycycline as directed. Be aware that this could change your Coumadin level. Make an appointment to follow-up with your regular doctor. Take the doxycycline for 7 days. Return for any new or worse symptoms. Also recommend warm compresses to bring the infection to the surface.

## 2016-11-15 NOTE — ED Notes (Signed)
Pt reports MRSA abscesses in past and noted that this area behind R ear States "keep the top off of it" and started taking Clindamycin that his physician prescribes for it since Thursday.

## 2016-11-18 DIAGNOSIS — Z299 Encounter for prophylactic measures, unspecified: Secondary | ICD-10-CM | POA: Diagnosis not present

## 2016-11-18 DIAGNOSIS — Z6837 Body mass index (BMI) 37.0-37.9, adult: Secondary | ICD-10-CM | POA: Diagnosis not present

## 2016-11-18 DIAGNOSIS — N183 Chronic kidney disease, stage 3 (moderate): Secondary | ICD-10-CM | POA: Diagnosis not present

## 2016-11-18 DIAGNOSIS — I428 Other cardiomyopathies: Secondary | ICD-10-CM | POA: Diagnosis not present

## 2016-11-18 DIAGNOSIS — Z87891 Personal history of nicotine dependence: Secondary | ICD-10-CM | POA: Diagnosis not present

## 2016-11-18 DIAGNOSIS — E1122 Type 2 diabetes mellitus with diabetic chronic kidney disease: Secondary | ICD-10-CM | POA: Diagnosis not present

## 2016-11-18 DIAGNOSIS — I4891 Unspecified atrial fibrillation: Secondary | ICD-10-CM | POA: Diagnosis not present

## 2016-11-26 ENCOUNTER — Ambulatory Visit (INDEPENDENT_AMBULATORY_CARE_PROVIDER_SITE_OTHER): Payer: PPO | Admitting: Cardiology

## 2016-11-26 ENCOUNTER — Encounter: Payer: Self-pay | Admitting: Cardiology

## 2016-11-26 VITALS — BP 133/75 | HR 64 | Ht 68.0 in | Wt 248.0 lb

## 2016-11-26 DIAGNOSIS — Z95810 Presence of automatic (implantable) cardiac defibrillator: Secondary | ICD-10-CM | POA: Diagnosis not present

## 2016-11-26 DIAGNOSIS — I480 Paroxysmal atrial fibrillation: Secondary | ICD-10-CM

## 2016-11-26 DIAGNOSIS — I255 Ischemic cardiomyopathy: Secondary | ICD-10-CM | POA: Diagnosis not present

## 2016-11-26 DIAGNOSIS — I2510 Atherosclerotic heart disease of native coronary artery without angina pectoris: Secondary | ICD-10-CM | POA: Diagnosis not present

## 2016-11-26 NOTE — Progress Notes (Signed)
Cardiology Office Note  Date: 11/26/2016   ID: [name], DOB 1941-10-14, MRN [id number]  PCP: [name], MD  Primary Cardiologist: Nona Dell, MD   Chief Complaint  Patient presents with  . Coronary Artery Disease    History of Present Illness: [name] is a 76 y.o. male last seen in July 2017.He presents for a routine follow-up visit. Despite his complex medical history he continues to do very well. Still enjoys dancing, has also been selling used cars. He does not report any angina symptoms, palpitations, or device shocks.  Interval follow-up with Dr. Johney Frame noted in November 2017. He is a Medtronic biventricular ICD in place, nearing ERI. I reviewed his ECG today which shows a ventricular paced rhythm.  We went over his medications which are outlined below. Cardiac regimen includes Plavix, Coumadin, Coreg, hydralazine, Imdur, lisinopril, Demadex, and as needed nitroglycerin. He has had no significant interval bleeding problems and we have discussed continuing Plavix and Coumadin in light of his history of strokes and recurrent TIAs as well as CAD with recurrent RCA interventions.  Past Medical History:  Diagnosis Date  . Anemia   . Anemia-chronic    a. Pt reports h/o anemia - was offered IV iron in past by nephrologist but declined and has taken PO instead.  . Aneurysm, cerebral   . BPH (benign prostatic hypertrophy)   . Cardiomyopathy, ischemic    LVEF 25-35%.  . Chronic kidney disease, stage III (moderate)    Followed by Dr. Fausto Skillern.  . Coronary atherosclerosis of native coronary artery    a. DES to RCA 03/2009. b. s/p PTCA to RCA for ISR, 05/2010. c. 03/2013 NSTEMI 2/2 severe prox RCA s/p PTCA/DES, med rx for residual dz.  . Essential hypertension, benign   . Hyperlipidemia   . LBBB (left bundle branch block)    s/p BiV ICD Implanted by Dr Graciela Husbands  . Morbid obesity (HCC)   . MRSA (methicillin resistant Staphylococcus aureus)   . Myocardial infarction   .  Paroxysmal atrial fibrillation (HCC)    a. Coumadin discontinue in 2010 when the patient required ASA/Plavix for stenting. b. Coumadin restarted 05/2013, Plavix continued, ASA stopped.   . Pulmonary nodule    CXR, 03/2013, [facility] - not described on any subsequent chest x-rays however, could be a vascular structure  . TIA (transient ischemic attack)    Multiple TIAs  . Type 2 diabetes mellitus (HCC)     Past Surgical History:  Procedure Laterality Date  . CHOLECYSTECTOMY    . ICD placement  May 06, 2011   Medtronic Protecta XT CRT-D  . LEFT HEART CATHETERIZATION WITH CORONARY ANGIOGRAM N/A 04/17/2013   Procedure: LEFT HEART CATHETERIZATION WITH CORONARY ANGIOGRAM;  Surgeon: Vesta Mixer, MD;  Location: [facility] CATH LAB;  Service: Cardiovascular;  Laterality: N/A;  . LUNG SURGERY     24 - 25 yrs. old  . TOTAL KNEE ARTHROPLASTY    . VASECTOMY      Current Outpatient Prescriptions  Medication Sig Dispense Refill  . acetaminophen (TYLENOL) 500 MG tablet Take 1 tablet (500 mg total) by mouth every 6 (six) hours as needed. 30 tablet 0  . calcitRIOL (ROCALTROL) 0.25 MCG capsule Take 1 capsule (0.25 mcg total) by mouth daily.    . carvedilol (COREG) 12.5 MG tablet TAKE ONE AND ONE-HALF TABLETS BY MOUTH TWICE DAILY WITH MEALS (DUE FOR FOLLOW UP) 270 tablet 3  . cholecalciferol (VITAMIN D) 1000 UNITS tablet Take 1,000 Units by mouth  daily.    . clopidogrel (PLAVIX) 75 MG tablet TAKE ONE TABLET BY MOUTH ONCE DAILY 30 tablet 6  . gabapentin (NEURONTIN) 100 MG capsule Take 100 mg by mouth 2 (two) times daily.    . hydrALAZINE (APRESOLINE) 25 MG tablet TAKE ONE TABLET BY MOUTH THREE TIMES DAILY 270 tablet 3  . HYDROcodone-acetaminophen (NORCO) 10-325 MG per tablet Take 1 tablet by mouth every 8 (eight) hours as needed for pain.     Marland Kitchen insulin lispro protamine-insulin lispro (HUMALOG 75/25) (75-25) 100 UNIT/ML SUSP Inject 15-25 Units into the skin 3 (three) times daily after meals. Will inject between 15 and  25 units each time blood sugar level is checked.    . isosorbide mononitrate (IMDUR) 30 MG 24 hr tablet TAKE ONE TABLET BY MOUTH ONCE DAILY 30 tablet 6  . L-Methylfolate-Algae-B12-B6 (METANX) 3-90.314-2-35 MG CAPS TAKE ONE CAPSULE BY MOUTH ONCE DAILY 60 capsule 3  . l-methylfolate-B6-B12 (METANX) 3-35-2 MG TABS Take 1 tablet by mouth daily. 30 tablet 0  . levothyroxine (SYNTHROID, LEVOTHROID) 25 MCG tablet Take 1 tablet by mouth daily.    Marland Kitchen linagliptin (TRADJENTA) 5 MG TABS tablet Take 5 mg by mouth daily.    Marland Kitchen lisinopril (PRINIVIL,ZESTRIL) 5 MG tablet Take 2.5 mg by mouth daily.     . nitroGLYCERIN (NITROSTAT) 0.4 MG SL tablet Place 0.4 mg under the tongue every 5 (five) minutes as needed for chest pain. For chest pain    . Omega-3 Fatty Acids (FISH OIL) 1000 MG CAPS Take 1,000 mg by mouth daily.    . Polysaccharide Iron Complex (FERREX 150 PO) Take 150 mg by mouth.    . torsemide (DEMADEX) 10 MG tablet Take 1 tablet (10 mg total) by mouth 2 (two) times daily. 60 tablet 3  . torsemide (DEMADEX) 10 MG tablet TAKE ONE TABLET BY MOUTH TWICE DAILY 60 tablet 3  . warfarin (COUMADIN) 5 MG tablet Take 2.5-5 mg by mouth daily at 6 PM. Takes 2.5mg  on mon only Takes 5mg  all other days    . Zinc 50 MG CAPS Take 50 mg by mouth daily.      No current facility-administered medications for this visit.    Allergies:  Codeine   Social History: The patient  reports that he quit smoking about 52 years ago. His smoking use included Cigarettes. He started smoking about 67 years ago. He has a 6.00 pack-year smoking history. He quit smokeless tobacco use about 51 years ago. He reports that he drinks alcohol. He reports that he does not use drugs.   ROS:  Please see the history of present illness. Otherwise, complete review of systems is positive for arthritic pain and stiffness.  All other systems are reviewed and negative.   Physical Exam: VS:  BP 133/75   Pulse 64   Ht 5\' 8"  (1.727 m)   Wt 248 lb (112.5 kg)    BMI 37.71 kg/m , BMI Body mass index is 37.71 kg/m.  Wt Readings from Last 3 Encounters:  11/26/16 248 lb (112.5 kg)  11/15/16 244 lb (110.7 kg)  09/18/16 248 lb 6.4 oz (112.7 kg)    Appears comfortable.  HEENT: Conjunctiva and lids normal, oropharynx with moist mucosa.  Neck: Supple, no elevated JVP with increased girth.  Cardiac: Regular rate and rhythm, no S3.  Lungs: Clear, nonlabored. Abdomen: Obese, nontender, bowel sounds present.  Skin: Warm and dry.  Extremities: 1+ leg edema.  ECG: I personally reviewed the tracing from 11/22/2015 which showed a  ventricular paced rhythm.  Recent Labwork:  September 2017: Hemoglobin 13.7, platelets 163, BUN 41, creatinine 1.8, potassium 4.7, AST 25, ALT 14, cholesterol 205, triglycerides 198, HDL 30, LDL 135, TSH 7.13  Other Studies Reviewed Today:  Echocardiogram 07/10/2015: Study Conclusions  - Procedure narrative: Transthoracic echocardiography for left ventricular function evaluation, for right ventricular function evaluation, and for assessment of valvular function. Image quality was poor. - Left ventricle: The cavity size was normal. Systolic function was difficult to assess due to poor visualization, but appeared grossly normal. LVEF 50-55%. Doppler parameters are consistent with abnormal left ventricular relaxation (grade 1 diastolic dysfunction). Doppler parameters are consistent with high ventricular filling pressure. Moderate to severe focal basal septal hypertrophy. - Regional wall motion abnormality: Hypokinesis of the mid inferior and mid inferolateral myocardium. - Ventricular septum: Septal motion showed abnormal function and dyssynergy. These changes are consistent with intraventricular conduction delay. - Aortic valve: Mildly to moderately calcified annulus. Mildly calcified leaflets. - Mitral valve: Mildly calcified annulus. - Right ventricle: Pacer wire or catheter noted in  right ventricle. - Right atrium: Pacer wire or catheter noted in right atrium.  Impressions:  - Consider contrast enhancement for more optimal left ventricular EF and regional wall motion assessment.  Assessment and Plan:  1. No active angina symptoms on current medical regimen with prior history of multiple RCA interventions as detailed above. We are continuing Plavix.  2. Paroxysmal atrial fibrillation, ventricular paced rhythm noted today. He denies palpitations. He is on Coumadin for stroke prophylaxis and has prior history of strokes with recurrent TIAs.  3. Ischemic cardiomyopathy with LVEF 50-55% by most recent assessment.  4. Medtronic CRT-D and place nearing ERI and followed closely by Dr. Johney Frame.  Current medicines were reviewed with the patient today.   Orders Placed This Encounter  Procedures  . EKG 12-Lead    Disposition: Follow-up in 6 months, sooner if needed.  Signed, Jonelle Sidle, MD, [facility] 11/26/2016 3:32 PM    [facility] at [facility] [address] [place], [place], [place] [address] Phone: [phone]; Fax: [phone]

## 2016-11-26 NOTE — Patient Instructions (Signed)

## 2016-12-08 DIAGNOSIS — M791 Myalgia: Secondary | ICD-10-CM | POA: Diagnosis not present

## 2016-12-08 DIAGNOSIS — I4891 Unspecified atrial fibrillation: Secondary | ICD-10-CM | POA: Diagnosis not present

## 2016-12-08 DIAGNOSIS — M47817 Spondylosis without myelopathy or radiculopathy, lumbosacral region: Secondary | ICD-10-CM | POA: Diagnosis not present

## 2016-12-08 DIAGNOSIS — M533 Sacrococcygeal disorders, not elsewhere classified: Secondary | ICD-10-CM | POA: Diagnosis not present

## 2016-12-08 DIAGNOSIS — M5416 Radiculopathy, lumbar region: Secondary | ICD-10-CM | POA: Diagnosis not present

## 2016-12-08 DIAGNOSIS — I82409 Acute embolism and thrombosis of unspecified deep veins of unspecified lower extremity: Secondary | ICD-10-CM | POA: Diagnosis not present

## 2016-12-09 ENCOUNTER — Telehealth: Payer: Self-pay | Admitting: *Deleted

## 2016-12-09 NOTE — Telephone Encounter (Signed)
Pt says feeling right side movement near breast off and on for a week - defibrillator on L side - concerned may be coming from defibrillator battery getting low - denies CP/SOB/dizziness - does c/o fatigue for last week or so. Will forward to device

## 2016-12-09 NOTE — Telephone Encounter (Signed)
Advised pt to send in a remote transmission. Pt stated that he would.

## 2016-12-09 NOTE — Telephone Encounter (Signed)
Pt called back informed pt that transmission was received, and that the battery had not tripped the ERI and that his transmission was normal. Encouraged pt to call PCP for other symptoms. Pt asked about transmission on 12/11/2016 informed pt that it should come across automatically but if not then he would receive a call. Pt stated that he was aware that his device would have an audible alarm when it did reach ERI.

## 2016-12-09 NOTE — Telephone Encounter (Signed)
Left voicemail for pt to call back.

## 2016-12-11 ENCOUNTER — Ambulatory Visit (INDEPENDENT_AMBULATORY_CARE_PROVIDER_SITE_OTHER): Payer: PPO | Admitting: *Deleted

## 2016-12-11 DIAGNOSIS — I255 Ischemic cardiomyopathy: Secondary | ICD-10-CM | POA: Diagnosis not present

## 2016-12-11 NOTE — Progress Notes (Signed)
Remote ICD transmission.   

## 2016-12-15 ENCOUNTER — Encounter: Payer: Self-pay | Admitting: Cardiology

## 2016-12-15 LAB — CUP PACEART REMOTE DEVICE CHECK
Battery Voltage: 2.62 V
Brady Statistic AP VP Percent: 4.39 %
Brady Statistic AP VS Percent: 0.09 %
Brady Statistic AS VP Percent: 95.35 %
Brady Statistic RA Percent Paced: 4.47 %
Date Time Interrogation Session: 20180126083431
HIGH POWER IMPEDANCE MEASURED VALUE: 50 Ohm
HighPow Impedance: 342 Ohm
HighPow Impedance: 61 Ohm
Implantable Lead Implant Date: 20120620
Implantable Lead Location: 753859
Implantable Lead Location: 753860
Implantable Lead Model: 4196
Lead Channel Impedance Value: 1121 Ohm
Lead Channel Impedance Value: 551 Ohm
Lead Channel Pacing Threshold Amplitude: 0.75 V
Lead Channel Pacing Threshold Amplitude: 0.75 V
Lead Channel Pacing Threshold Pulse Width: 0.4 ms
Lead Channel Sensing Intrinsic Amplitude: 0.875 mV
Lead Channel Sensing Intrinsic Amplitude: 8.625 mV
Lead Channel Setting Pacing Amplitude: 2 V
Lead Channel Setting Pacing Amplitude: 2.5 V
Lead Channel Setting Pacing Pulse Width: 0.4 ms
Lead Channel Setting Pacing Pulse Width: 0.4 ms
Lead Channel Setting Sensing Sensitivity: 0.3 mV
MDC IDC LEAD IMPLANT DT: 20120620
MDC IDC LEAD IMPLANT DT: 20120620
MDC IDC LEAD LOCATION: 753858
MDC IDC MSMT LEADCHNL LV IMPEDANCE VALUE: 703 Ohm
MDC IDC MSMT LEADCHNL LV PACING THRESHOLD PULSEWIDTH: 0.4 ms
MDC IDC MSMT LEADCHNL RA IMPEDANCE VALUE: 456 Ohm
MDC IDC MSMT LEADCHNL RA SENSING INTR AMPL: 0.875 mV
MDC IDC MSMT LEADCHNL RV IMPEDANCE VALUE: 399 Ohm
MDC IDC MSMT LEADCHNL RV PACING THRESHOLD AMPLITUDE: 0.625 V
MDC IDC MSMT LEADCHNL RV PACING THRESHOLD PULSEWIDTH: 0.4 ms
MDC IDC MSMT LEADCHNL RV SENSING INTR AMPL: 8.625 mV
MDC IDC PG IMPLANT DT: 20120620
MDC IDC SET LEADCHNL LV PACING AMPLITUDE: 1.75 V
MDC IDC STAT BRADY AS VS PERCENT: 0.17 %
MDC IDC STAT BRADY RV PERCENT PACED: 99.07 %

## 2016-12-16 DIAGNOSIS — I4891 Unspecified atrial fibrillation: Secondary | ICD-10-CM | POA: Diagnosis not present

## 2016-12-16 DIAGNOSIS — E119 Type 2 diabetes mellitus without complications: Secondary | ICD-10-CM | POA: Diagnosis not present

## 2017-01-05 DIAGNOSIS — M5416 Radiculopathy, lumbar region: Secondary | ICD-10-CM | POA: Diagnosis not present

## 2017-01-05 DIAGNOSIS — M533 Sacrococcygeal disorders, not elsewhere classified: Secondary | ICD-10-CM | POA: Diagnosis not present

## 2017-01-05 DIAGNOSIS — M47817 Spondylosis without myelopathy or radiculopathy, lumbosacral region: Secondary | ICD-10-CM | POA: Diagnosis not present

## 2017-01-05 DIAGNOSIS — M791 Myalgia: Secondary | ICD-10-CM | POA: Diagnosis not present

## 2017-01-11 ENCOUNTER — Ambulatory Visit (INDEPENDENT_AMBULATORY_CARE_PROVIDER_SITE_OTHER): Payer: PPO | Admitting: *Deleted

## 2017-01-11 ENCOUNTER — Telehealth: Payer: Self-pay | Admitting: Cardiology

## 2017-01-11 DIAGNOSIS — Z713 Dietary counseling and surveillance: Secondary | ICD-10-CM | POA: Diagnosis not present

## 2017-01-11 DIAGNOSIS — E1122 Type 2 diabetes mellitus with diabetic chronic kidney disease: Secondary | ICD-10-CM | POA: Diagnosis not present

## 2017-01-11 DIAGNOSIS — M2550 Pain in unspecified joint: Secondary | ICD-10-CM | POA: Diagnosis not present

## 2017-01-11 DIAGNOSIS — N183 Chronic kidney disease, stage 3 (moderate): Secondary | ICD-10-CM | POA: Diagnosis not present

## 2017-01-11 DIAGNOSIS — Z6838 Body mass index (BMI) 38.0-38.9, adult: Secondary | ICD-10-CM | POA: Diagnosis not present

## 2017-01-11 DIAGNOSIS — Z299 Encounter for prophylactic measures, unspecified: Secondary | ICD-10-CM | POA: Diagnosis not present

## 2017-01-11 DIAGNOSIS — I255 Ischemic cardiomyopathy: Secondary | ICD-10-CM | POA: Diagnosis not present

## 2017-01-11 DIAGNOSIS — I4891 Unspecified atrial fibrillation: Secondary | ICD-10-CM | POA: Diagnosis not present

## 2017-01-11 NOTE — Telephone Encounter (Signed)
LMOVM reminding pt to send remote transmission.   

## 2017-01-12 ENCOUNTER — Encounter: Payer: Self-pay | Admitting: Cardiology

## 2017-01-12 NOTE — Progress Notes (Signed)
Remote ICD transmission.   

## 2017-01-13 DIAGNOSIS — I4891 Unspecified atrial fibrillation: Secondary | ICD-10-CM | POA: Diagnosis not present

## 2017-01-13 DIAGNOSIS — E119 Type 2 diabetes mellitus without complications: Secondary | ICD-10-CM | POA: Diagnosis not present

## 2017-01-13 LAB — CUP PACEART REMOTE DEVICE CHECK
Battery Voltage: 2.62 V
Brady Statistic AP VP Percent: 5.25 %
Brady Statistic AS VS Percent: 0.13 %
Brady Statistic RV Percent Paced: 99.14 %
HIGH POWER IMPEDANCE MEASURED VALUE: 342 Ohm
HighPow Impedance: 51 Ohm
HighPow Impedance: 63 Ohm
Implantable Lead Implant Date: 20120620
Implantable Lead Implant Date: 20120620
Implantable Lead Location: 753858
Implantable Lead Location: 753860
Implantable Lead Model: 4196
Implantable Lead Model: 5076
Implantable Lead Model: 6947
Implantable Pulse Generator Implant Date: 20120620
Lead Channel Impedance Value: 418 Ohm
Lead Channel Impedance Value: 513 Ohm
Lead Channel Impedance Value: 722 Ohm
Lead Channel Pacing Threshold Amplitude: 0.75 V
Lead Channel Pacing Threshold Pulse Width: 0.4 ms
Lead Channel Pacing Threshold Pulse Width: 0.4 ms
Lead Channel Sensing Intrinsic Amplitude: 1.125 mV
Lead Channel Sensing Intrinsic Amplitude: 1.125 mV
Lead Channel Sensing Intrinsic Amplitude: 14.875 mV
Lead Channel Setting Pacing Amplitude: 2 V
Lead Channel Setting Pacing Pulse Width: 0.4 ms
Lead Channel Setting Pacing Pulse Width: 0.4 ms
MDC IDC LEAD IMPLANT DT: 20120620
MDC IDC LEAD LOCATION: 753859
MDC IDC MSMT LEADCHNL LV IMPEDANCE VALUE: 1121 Ohm
MDC IDC MSMT LEADCHNL LV IMPEDANCE VALUE: 551 Ohm
MDC IDC MSMT LEADCHNL LV PACING THRESHOLD AMPLITUDE: 0.875 V
MDC IDC MSMT LEADCHNL RA PACING THRESHOLD AMPLITUDE: 0.625 V
MDC IDC MSMT LEADCHNL RA PACING THRESHOLD PULSEWIDTH: 0.4 ms
MDC IDC MSMT LEADCHNL RV SENSING INTR AMPL: 14.875 mV
MDC IDC SESS DTM: 20180226212712
MDC IDC SET LEADCHNL RA PACING AMPLITUDE: 2 V
MDC IDC SET LEADCHNL RV PACING AMPLITUDE: 2.5 V
MDC IDC SET LEADCHNL RV SENSING SENSITIVITY: 0.3 mV
MDC IDC STAT BRADY AP VS PERCENT: 0.13 %
MDC IDC STAT BRADY AS VP PERCENT: 94.5 %
MDC IDC STAT BRADY RA PERCENT PACED: 5.37 %

## 2017-01-27 DIAGNOSIS — I10 Essential (primary) hypertension: Secondary | ICD-10-CM | POA: Diagnosis not present

## 2017-01-27 DIAGNOSIS — N189 Chronic kidney disease, unspecified: Secondary | ICD-10-CM | POA: Diagnosis not present

## 2017-01-27 DIAGNOSIS — G609 Hereditary and idiopathic neuropathy, unspecified: Secondary | ICD-10-CM | POA: Diagnosis not present

## 2017-01-27 DIAGNOSIS — E1165 Type 2 diabetes mellitus with hyperglycemia: Secondary | ICD-10-CM | POA: Diagnosis not present

## 2017-01-28 DIAGNOSIS — Z800 Family history of malignant neoplasm of digestive organs: Secondary | ICD-10-CM | POA: Diagnosis not present

## 2017-01-30 DIAGNOSIS — H40033 Anatomical narrow angle, bilateral: Secondary | ICD-10-CM | POA: Diagnosis not present

## 2017-01-30 DIAGNOSIS — H00013 Hordeolum externum right eye, unspecified eyelid: Secondary | ICD-10-CM | POA: Diagnosis not present

## 2017-02-01 DIAGNOSIS — M5416 Radiculopathy, lumbar region: Secondary | ICD-10-CM | POA: Diagnosis not present

## 2017-02-01 DIAGNOSIS — G894 Chronic pain syndrome: Secondary | ICD-10-CM | POA: Diagnosis not present

## 2017-02-01 DIAGNOSIS — M791 Myalgia: Secondary | ICD-10-CM | POA: Diagnosis not present

## 2017-02-01 DIAGNOSIS — M47817 Spondylosis without myelopathy or radiculopathy, lumbosacral region: Secondary | ICD-10-CM | POA: Diagnosis not present

## 2017-02-01 DIAGNOSIS — M533 Sacrococcygeal disorders, not elsewhere classified: Secondary | ICD-10-CM | POA: Diagnosis not present

## 2017-02-01 DIAGNOSIS — Z79891 Long term (current) use of opiate analgesic: Secondary | ICD-10-CM | POA: Diagnosis not present

## 2017-02-02 DIAGNOSIS — Z713 Dietary counseling and surveillance: Secondary | ICD-10-CM | POA: Diagnosis not present

## 2017-02-02 DIAGNOSIS — Z299 Encounter for prophylactic measures, unspecified: Secondary | ICD-10-CM | POA: Diagnosis not present

## 2017-02-02 DIAGNOSIS — Z6838 Body mass index (BMI) 38.0-38.9, adult: Secondary | ICD-10-CM | POA: Diagnosis not present

## 2017-02-02 DIAGNOSIS — M79671 Pain in right foot: Secondary | ICD-10-CM | POA: Diagnosis not present

## 2017-02-03 ENCOUNTER — Telehealth: Payer: Self-pay | Admitting: Cardiology

## 2017-02-03 NOTE — Telephone Encounter (Signed)
Spoke w/ pt and informed him of his device reaching ERI. Informed him that if the alert bothers him he can come into the office and have it turned off or leave it on that it does not effect the device one way or the other. Once battery reaches RRT battery has about 3 months of battery life left. Informed him a scheduler will be in contact with him to schedule an appt. Pt verbalized understanding.

## 2017-02-08 DIAGNOSIS — I4891 Unspecified atrial fibrillation: Secondary | ICD-10-CM | POA: Diagnosis not present

## 2017-02-08 DIAGNOSIS — E119 Type 2 diabetes mellitus without complications: Secondary | ICD-10-CM | POA: Diagnosis not present

## 2017-02-09 DIAGNOSIS — I4891 Unspecified atrial fibrillation: Secondary | ICD-10-CM | POA: Diagnosis not present

## 2017-02-09 DIAGNOSIS — M109 Gout, unspecified: Secondary | ICD-10-CM | POA: Diagnosis not present

## 2017-02-09 DIAGNOSIS — Z299 Encounter for prophylactic measures, unspecified: Secondary | ICD-10-CM | POA: Diagnosis not present

## 2017-02-09 DIAGNOSIS — E1122 Type 2 diabetes mellitus with diabetic chronic kidney disease: Secondary | ICD-10-CM | POA: Diagnosis not present

## 2017-02-09 DIAGNOSIS — N183 Chronic kidney disease, stage 3 (moderate): Secondary | ICD-10-CM | POA: Diagnosis not present

## 2017-02-09 DIAGNOSIS — I428 Other cardiomyopathies: Secondary | ICD-10-CM | POA: Diagnosis not present

## 2017-02-09 DIAGNOSIS — Z6838 Body mass index (BMI) 38.0-38.9, adult: Secondary | ICD-10-CM | POA: Diagnosis not present

## 2017-02-12 ENCOUNTER — Encounter: Payer: PPO | Admitting: *Deleted

## 2017-02-12 ENCOUNTER — Telehealth: Payer: Self-pay | Admitting: Cardiology

## 2017-02-12 NOTE — Telephone Encounter (Signed)
Confirmed remote transmission w/ pt wife.   

## 2017-02-15 ENCOUNTER — Other Ambulatory Visit: Payer: Self-pay | Admitting: Cardiology

## 2017-02-19 ENCOUNTER — Encounter: Payer: Self-pay | Admitting: Cardiology

## 2017-02-24 ENCOUNTER — Ambulatory Visit (INDEPENDENT_AMBULATORY_CARE_PROVIDER_SITE_OTHER): Payer: PPO | Admitting: Podiatry

## 2017-02-24 ENCOUNTER — Ambulatory Visit (INDEPENDENT_AMBULATORY_CARE_PROVIDER_SITE_OTHER): Payer: PPO

## 2017-02-24 ENCOUNTER — Encounter: Payer: Self-pay | Admitting: Podiatry

## 2017-02-24 VITALS — BP 161/64 | HR 78

## 2017-02-24 DIAGNOSIS — L923 Foreign body granuloma of the skin and subcutaneous tissue: Secondary | ICD-10-CM | POA: Diagnosis not present

## 2017-02-24 DIAGNOSIS — M109 Gout, unspecified: Secondary | ICD-10-CM

## 2017-02-24 DIAGNOSIS — R52 Pain, unspecified: Secondary | ICD-10-CM

## 2017-02-24 NOTE — Progress Notes (Signed)
   Subjective:    Patient ID: [name], male    DOB: 06/11/1941, 76 y.o.   MRN: [id number]  HPI this patient presents the office for an evaluation of his big toe joint of his right foot. He says that he developed gout in the big toe joint of the right foot and was treated with prednisone. He says his foot has healed well from that problem. He says he also developed a skin lesion under the big toe joint of the right foot that he worked on himself about 1 week ago. He was also seen by his medical doctor for this problem and the doctor probed the area believing she may have removed a piece of wood. The skin has closed over and there is significant decrease in pain since last week. He presents the office today for continued evaluation and treatment of this right foot.  Patient is diabetic and has been diagnosed with PVD.    Review of Systems  All other systems reviewed and are negative.      Objective:   Physical Exam GENERAL APPEARANCE: Alert, conversant. Appropriately groomed. No acute distress.  VASCULAR: Pedal pulses are  palpable at  [facility] and PT bilateral.  Capillary refill time is immediate to all digits,  Normal temperature gradient.  Digital hair growth is present bilateral  NEUROLOGIC: sensation is normal to 5.07 monofilament at 5/5 sites bilateral.  Light touch is intact bilateral, Muscle strength normal.  MUSCULOSKELETAL: acceptable muscle strength, tone and stability bilateral.  Intrinsic muscluature intact bilateral.  Rectus appearance of foot and digits noted bilateral.   DERMATOLOGIC: skin color, texture, and turgor are within normal limits.  No preulcerative lesions or ulcers  are seen, no interdigital maceration noted.  No open lesions present.  No drainage noted. Therefore, is a localized area of skin under the tibial sesamoid that is healing with no evidence of any redness, swelling or drainage. The skin itself is purplish in color and no palpable pain is noted at this  time           Assessment & Plan:  Gout right foot   Possible FB right foot.   IE  X-ray was taken for this patient and no foreign body was noted in the foot. There is arthritis at the first [facility] J right foot. There is also calcification noted to the vascularity of the right foot.  Discussed this condition with the patient and told him he needs to allow the skin lesion to heal .  Padding was applied to this area to help off weight bear. The skin lesion during gait. His gout appears to have resolved and he is not having any discomfort related to  gout  Patient to return to the office when necessary    Helane Gunther DPM

## 2017-02-26 ENCOUNTER — Encounter: Payer: Self-pay | Admitting: Internal Medicine

## 2017-02-26 ENCOUNTER — Ambulatory Visit (INDEPENDENT_AMBULATORY_CARE_PROVIDER_SITE_OTHER): Payer: PPO | Admitting: Internal Medicine

## 2017-02-26 ENCOUNTER — Encounter: Payer: Self-pay | Admitting: *Deleted

## 2017-02-26 ENCOUNTER — Telehealth: Payer: Self-pay | Admitting: Internal Medicine

## 2017-02-26 VITALS — BP 130/60 | HR 55 | Ht 68.0 in | Wt 251.0 lb

## 2017-02-26 DIAGNOSIS — I5022 Chronic systolic (congestive) heart failure: Secondary | ICD-10-CM | POA: Diagnosis not present

## 2017-02-26 DIAGNOSIS — I10 Essential (primary) hypertension: Secondary | ICD-10-CM

## 2017-02-26 DIAGNOSIS — Z95810 Presence of automatic (implantable) cardiac defibrillator: Secondary | ICD-10-CM | POA: Diagnosis not present

## 2017-02-26 DIAGNOSIS — Z01812 Encounter for preprocedural laboratory examination: Secondary | ICD-10-CM

## 2017-02-26 NOTE — Progress Notes (Signed)
Office Visit Note  Patient: [name]             Date of Birth: July 13, 1941           MRN: [id number]             PCP: Monico Blitz, MD Referring: Monico Blitz, MD Visit Date: 03/02/2017 Occupation: _0 @    Subjective:  Pain in bilateral hands.   History of Present Illness: [name] is a 76 y.o. male seen in consultation per request of his PCP. According to patient he had bilateral total knee replacement about 14 years ago. He's been on pain medications for that. He states about 45 months ago he went to see Dr. crisp he evaluated him and found that he was having swelling in his hands and wrists joints. He recommended that he should be seen by a rheumatologist. He does not recall getting any prescription medications for hand pain or discomfort. He states about 3 weeks ago he developed gout flare in his right foot that was his first episode after eating shrimp. His PCP did blood work which was consistent with gout and he was given prednisone for 10 days. He states he finished prednisone about 2 weeks ago. He reports decreased grip strength in his hands and swelling and pain especially in the evening. He is unable to wear his rings anymore. Patient states he developed ulcer on the bottom plantar aspect of his right first toe for which he was seen initially by PCP and then by podiatrist. He states is gradually use healing.  Activities of Daily Living:  Patient reports morning stiffness for0 minute.   Patient Reports nocturnal pain.  Difficulty dressing/grooming: Denies Difficulty climbing stairs: Denies Difficulty getting out of chair: Denies Difficulty using hands for taps, buttons, cutlery, and/or writing: Denies   Review of Systems  Constitutional: Negative for fatigue, night sweats and weakness ( ).  HENT: Negative for mouth sores, mouth dryness and nose dryness.   Eyes: Negative for redness and dryness.  Respiratory: Negative for shortness of breath and difficulty  breathing.   Cardiovascular: Negative for chest pain, palpitations, hypertension, irregular heartbeat and swelling in legs/feet.  Gastrointestinal: Negative for constipation and diarrhea.  Endocrine: Negative for increased urination.  Musculoskeletal: Positive for arthralgias, joint pain, joint swelling and morning stiffness. Negative for myalgias, muscle weakness, muscle tenderness and myalgias.  Skin: Negative for color change, rash, hair loss, nodules/bumps, skin tightness, ulcers and sensitivity to sunlight.  Allergic/Immunologic: Negative for susceptible to infections.  Neurological: Negative for dizziness, fainting, memory loss and night sweats.  Hematological: Negative for swollen glands.  Psychiatric/Behavioral: Positive for sleep disturbance. Negative for depressed mood. The patient is not nervous/anxious.     PMFS History:  Patient Active Problem List   Diagnosis Date Noted  . PVD (peripheral vascular disease) with claudication ([place]) 07/11/2014  . TIA (transient ischemic attack) 05/30/2013  . Long term (current) use of anticoagulants 05/30/2013  . CKD (chronic kidney disease) stage 3, GFR 30-59 ml/min 05/29/2013  . Paroxysmal atrial fibrillation (HCC)   . Cardiomyopathy, ischemic   . Biventricular ICD (implantable cardioverter-defibrillator) in place 08/17/2012  . DM 05/15/2010  . Hypertensive cardiovascular disease 04/25/2010  . HYPERLIPIDEMIA-MIXED 08/28/2009  . CAD, NATIVE VESSEL 08/28/2009  . LBBB 08/28/2009  . SYSTOLIC HEART FAILURE, CHRONIC 08/28/2009    Past Medical History:  Diagnosis Date  . Anemia   . Anemia-chronic    a. Pt reports h/o anemia - was offered IV iron in past by  nephrologist but declined and has taken PO instead.  . Aneurysm, cerebral   . BPH (benign prostatic hypertrophy)   . Cardiomyopathy, ischemic    LVEF 25-35%.  . Chronic kidney disease, stage III (moderate)    Followed by Dr. Hinda Lenis.  . Coronary atherosclerosis of native coronary  artery    a. DES to RCA 03/2009. b. s/p PTCA to RCA for ISR, 05/2010. c. 03/2013 NSTEMI 2/2 severe prox RCA s/p PTCA/DES, med rx for residual dz.  . Essential hypertension, benign   . Hyperlipidemia   . LBBB (left bundle branch block)    s/p BiV ICD Implanted by Dr Caryl Comes  . Morbid obesity ([place])   . MRSA (methicillin resistant Staphylococcus aureus)   . Myocardial infarction ([place])   . Paroxysmal atrial fibrillation ([place])    a. Coumadin discontinue in 2010 when the patient required ASA/Plavix for stenting. b. Coumadin restarted 05/2013, Plavix continued, ASA stopped.   . Pulmonary nodule    CXR, 03/2013, [facility] - not described on any subsequent chest x-rays however, could be a vascular structure  . TIA (transient ischemic attack)    Multiple TIAs  . Type 2 diabetes mellitus (HCC)     Family History  Problem Relation Age of Onset  . Diabetes Mother     Bilateral leg amputation  . Heart disease Mother     Before age 53  . Hypertension Mother   . Cancer Father     Prostate and Bone  . Diabetes Father   . Cancer Brother     Prostate  . Heart disease Other     family h/o premature cardiovascular disease  . Cancer Sister     Colon   . Hypertension Sister    Past Surgical History:  Procedure Laterality Date  . CHOLECYSTECTOMY    . ICD placement  May 06, 2011   Medtronic Protecta XT CRT-D  . LEFT HEART CATHETERIZATION WITH CORONARY ANGIOGRAM N/A 04/17/2013   Procedure: LEFT HEART CATHETERIZATION WITH CORONARY ANGIOGRAM;  Surgeon: Thayer Headings, MD;  Location: [facility] CATH LAB;  Service: Cardiovascular;  Laterality: N/A;  . LUNG SURGERY     24 - 25 yrs. old  . TOTAL KNEE ARTHROPLASTY    . VASECTOMY     Social History   Social History Narrative  . No narrative on file     Objective: Vital Signs: BP 130/74 (BP Location: Right Arm)   Resp 14   Ht _0  (1.727 m)   Wt 256 lb (116.1 kg)   BMI 38.92 kg/m    Physical Exam  Constitutional: He is oriented to person, place, and time. He  appears well-developed and well-nourished.  HENT:  Head: Normocephalic and atraumatic.  Eyes: Conjunctivae and EOM are normal. Pupils are equal, round, and reactive to light.  Neck: Normal range of motion. Neck supple.  Cardiovascular: Normal rate, regular rhythm and normal heart sounds.   Pulmonary/Chest: Effort normal and breath sounds normal.  Abdominal: Soft. Bowel sounds are normal.  Neurological: He is alert and oriented to person, place, and time.  Skin: Skin is warm and dry. Capillary refill takes less than 2 seconds.  Psychiatric: He has a normal mood and affect. His behavior is normal.  Nursing note and vitals reviewed.    Musculoskeletal Exam: C-spine limited range of motion. He had mild thoracic kyphosis. Some lumbar spine limitation. Shoulder joints elbow joints are good range of motion. He has synovitis and limited range of motion of bilateral wrist joints MCP joints.  PIP DIPs were good range of motion with no synovitis. His right wrist joint extension was 40 and flexion 30 and left wrist joint extension was 32 and flexion 30. Hip joints are good range of motion. He has bilateral total knee replacement which appears to be doing well with slight warmth. There is some fullness over his ankle joints. He had tenderness on palpation over his right first MTP joint. He also had a small healing ulcer on the base of his right first MTP joint on the plantar aspect. Which appears to be healing gradually.  CDAI Exam: CDAI Homunculus Exam:   Tenderness:  RUE: wrist LUE: wrist Right hand: 1st MCP, 2nd MCP, 3rd MCP, 4th MCP and 5th MCP Left hand: 1st MCP, 2nd MCP, 3rd MCP, 4th MCP and 5th MCP RLE: tibiotalar LLE: tibiotalar  Swelling:  RUE: wrist LUE: wrist Right hand: 2nd MCP, 3rd MCP, 4th MCP and 5th MCP Left hand: 1st MCP, 2nd MCP, 3rd MCP, 4th MCP and 5th MCP RLE: tibiotalar LLE: tibiotalar  Joint Counts:  CDAI Tender Joint count: 12 CDAI Swollen Joint count: 11  Global  Assessments:  Patient Global Assessment: 8 Provider Global Assessment: 8  CDAI Calculated Score: 39    Investigation: Findings:  01/12/2017 RF 17.3 elevated, ESR 22, LDL 135, T4 normal, CBC normal, CMP creatinine 1.78, GFR 36, PSA normal, hemoglobin A1c 7.5%, TSH elevated at 7.130    Imaging: Dg Foot Complete Right  Result Date: 02/24/2017 Please see detailed radiograph report in office note.  Xr Foot 2 Views Left  Result Date: 03/02/2017 DIP PIP narrowing was noted. No erosive changes were noted. There were some cystic changes and tarsal bones. A small calcaneal spur was noted. These findings were consistent with osteoarthritis of the foot.  Xr Hand 2 View Left  Result Date: 03/02/2017 Left second and third MCP joint narrowing all PIP joints narrowing intercarpal joint space narrowing radiocarpal joint space narrowing or erosion noted in the carpal bone. Calcification of the blood vessels was also noted. Impression: These findings are consistent with rheumatoid arthritis and osteoarthritis overlap  Xr Hand 2 View Right  Result Date: 03/02/2017 Right second and third MCP joint narrowing all PIP/DIP joint narrowing all intercarpal joint space and radiocarpal joint space narrowing with some erosive changes noted in the carpal bones. Calcification of the blood vessels was also noted as well. Impression these findings are consistent with rheumatoid arthritis and  osteoarthritis overlap   Speciality Comments: No specialty comments available.    Procedures:  No procedures performed Allergies: Codeine   Assessment / Plan:     Visit Diagnoses: Pain in both hands - Positive RF he has synovitis on examination of bilateral wrist joints and bilateral hands. I'll obtain following labs and x-rays today.- Plan: XR Hand 2 View Right, XR Hand 2 View Left, Cyclic citrul peptide antibody, IgG, ANA. X-rays revealed narrowing of the MCPs and intercarpal joint space narrowing with possible erosive  changes in the carpal bones consistent with inflammatory arthritis most likely rheumatoid arthritis.  H/O total knee replacement, bilateral: Appears to be doing well  Pain in both feet -patient gives recent episode of gout in his right foot. He still have some tenderness in his right first toe. He also has a healing ulcer on the plantar surface of his right first toe. He seen podiatrist for that. Plan: XR Foot 2 Views Left, x-ray of the left foot was unremarkable. Uric acid  Hypertensive heart disease with heart failure (HCC)  High  risk medication use -in anticipation to start him on immunosuppressive therapy I'll obtain following labs Plan: Glucose 6 phosphate dehydrogenase, Serum protein electrophoresis with reflex, Hepatitis B core antibody, IgM, Hepatitis B surface antigen, Hepatitis C antibody, IgG, IgA, IgM, Quantiferon tb gold assay (blood)  Other fatigue - Plan: CK   His other medical problems are listed as follows:  SYSTOLIC HEART FAILURE, CHRONIC  Atherosclerosis of native coronary artery of native heart without angina pectoris  Paroxysmal atrial fibrillation (HCC) - On Coumadin  Cardiomyopathy, ischemic  Biventricular ICD (implantable cardioverter-defibrillator) in place  CKD (chronic kidney disease) stage 3, GFR 30-59 ml/min  History of hypothyroidism  Former smoker    Orders: Orders Placed This Encounter  Procedures  . XR Hand 2 View Right  . XR Hand 2 View Left  . XR Foot 2 Views Left  . CK  . Cyclic citrul peptide antibody, IgG  . ANA  . Uric acid  . Glucose 6 phosphate dehydrogenase  . Serum protein electrophoresis with reflex  . Hepatitis B core antibody, IgM  . Hepatitis B surface antigen  . Hepatitis C antibody  . IgG, IgA, IgM  . Quantiferon tb gold assay (blood)   No orders of the defined types were placed in this encounter.   Face-to-face time spent with patient was50 minutes. 50% of time was spent in counseling and coordination of  care.  Follow-Up Instructions: Return for Inflammatory arthritis.   Bo Merino, MD  Note - This record has been created using Editor, commissioning.  Chart creation errors have been sought, but may not always  have been located. Such creation errors do not reflect on  the standard of medical care.

## 2017-02-26 NOTE — Telephone Encounter (Signed)
Pre-cert Verification for the following procedure   Echo scheduled for 03/03/17 at [facility]

## 2017-02-26 NOTE — Progress Notes (Signed)
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  His ICD has reached elective replacement time.  He remains active.  He still goes dancing three nights per week.  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  Today, he denies symptoms of palpitations, chest pain, shortness of breath,  lower extremity edema, dizziness, presyncope, syncope, or ICD shocks.  The patient is otherwise without complaint today.  He says that his renal function is stable.   Past Medical History:  Diagnosis Date  . Anemia   . Anemia-chronic    a. Pt reports h/o anemia - was offered IV iron in past by nephrologist but declined and has taken PO instead.  . Aneurysm, cerebral   . BPH (benign prostatic hypertrophy)   . Cardiomyopathy, ischemic    LVEF 25-35%.  . Chronic kidney disease, stage III (moderate)    Followed by Dr. Fausto Skillern.  . Coronary atherosclerosis of native coronary artery    a. DES to RCA 03/2009. b. s/p PTCA to RCA for ISR, 05/2010. c. 03/2013 NSTEMI 2/2 severe prox RCA s/p PTCA/DES, med rx for residual dz.  . Essential hypertension, benign   . Hyperlipidemia   . LBBB (left bundle branch block)    s/p BiV ICD Implanted by Dr Graciela Husbands  . Morbid obesity (HCC)   . MRSA (methicillin resistant Staphylococcus aureus)   . Myocardial infarction   . Paroxysmal atrial fibrillation (HCC)    a. Coumadin discontinue in 2010 when the patient required ASA/Plavix for stenting. b. Coumadin restarted 05/2013, Plavix continued, ASA stopped.   . Pulmonary nodule    CXR, 03/2013, [facility] - not described on any subsequent chest x-rays however, could be a vascular structure  . TIA (transient ischemic attack)    Multiple TIAs  . Type 2 diabetes mellitus (HCC)    Past Surgical History:  Procedure Laterality Date  . CHOLECYSTECTOMY    . ICD placement  May 06, 2011   Medtronic Protecta XT CRT-D  . LEFT HEART CATHETERIZATION WITH  CORONARY ANGIOGRAM N/A 04/17/2013   Procedure: LEFT HEART CATHETERIZATION WITH CORONARY ANGIOGRAM;  Surgeon: Vesta Mixer, MD;  Location: [facility] CATH LAB;  Service: Cardiovascular;  Laterality: N/A;  . LUNG SURGERY     23 - 24 yrs. old  . TOTAL KNEE ARTHROPLASTY    . VASECTOMY      Current Outpatient Prescriptions  Medication Sig Dispense Refill  . carvedilol (COREG) 12.5 MG tablet TAKE ONE AND ONE-HALF TABLETS BY MOUTH TWICE DAILY WITH MEALS (DUE FOR FOLLOW UP) 270 tablet 3  . cholecalciferol (VITAMIN D) 1000 UNITS tablet Take 1,000 Units by mouth daily.    . clopidogrel (PLAVIX) 75 MG tablet TAKE ONE TABLET BY MOUTH ONCE DAILY 30 tablet 6  . gabapentin (NEURONTIN) 100 MG capsule Take 100 mg by mouth 2 (two) times daily.    . hydrALAZINE (APRESOLINE) 25 MG tablet TAKE ONE TABLET BY MOUTH THREE TIMES DAILY 270 tablet 3  . HYDROcodone-acetaminophen (NORCO) 10-325 MG per tablet Take 1 tablet by mouth every 8 (eight) hours as needed for pain.     Marland Kitchen insulin lispro protamine-insulin lispro (HUMALOG 75/25) (75-25) 100 UNIT/ML SUSP Inject 15-25 Units into the skin 3 (three) times daily after meals. Will inject between 15 and 25 units each time blood sugar level is checked.    . isosorbide mononitrate (IMDUR) 30 MG 24 hr tablet TAKE ONE TABLET BY  MOUTH ONCE DAILY 30 tablet 6  . L-Methylfolate-Algae-B12-B6 (METANX) 3-90.314-2-35 MG CAPS TAKE ONE CAPSULE BY MOUTH ONCE DAILY 60 capsule 3  . levothyroxine (SYNTHROID, LEVOTHROID) 25 MCG tablet Take 1 tablet by mouth daily.    Marland Kitchen lisinopril (PRINIVIL,ZESTRIL) 5 MG tablet Take 2.5 mg by mouth daily.     Marland Kitchen loratadine (CLARITIN) 10 MG tablet Take 10 mg by mouth daily.    . nitroGLYCERIN (NITROSTAT) 0.4 MG SL tablet Place 0.4 mg under the tongue every 5 (five) minutes as needed for chest pain. For chest pain    . Omega-3 Fatty Acids (FISH OIL) 1000 MG CAPS Take 1,000 mg by mouth daily.    . Polysaccharide Iron Complex (FERREX 150 PO) Take 150 mg by mouth.    .  torsemide (DEMADEX) 10 MG tablet Take 1 tablet (10 mg total) by mouth 2 (two) times daily. 60 tablet 3  . warfarin (COUMADIN) 5 MG tablet Take 2.5-5 mg by mouth daily at 6 PM. Takes 2.5mg  on mon only Takes 5mg  all other days    . Zinc 50 MG CAPS Take 50 mg by mouth daily.      No current facility-administered medications for this visit.     Physical Exam: Vitals:   02/26/17 1136  BP: 130/60  Pulse: (!) 55  SpO2: 98%  Weight: 251 lb (113.9 kg)  Height: 5\' 8"  (1.727 m)    GEN- The patient is overweight appearing, alert and oriented x 3 today.  pleasant Head- normocephalic, atraumatic Eyes-  Sclera clear, conjunctiva pink Ears- hearing intact Oropharynx- clear Lungs- Clear to ausculation bilaterally, normal work of breathing Chest- ICD pocket is well healed Heart- Regular rate and rhythm, no murmurs, rubs or gallops, PMI not laterally displaced GI- soft, NT, ND, + BS Extremities- no clubbing, cyanosis, or edema  ICD interrogation- reviewed in detail today,  See PACEART report  Assessment and Plan:  1.  Chronic systolic dysfunction euvolemic today Stable on an appropriate medical regimen Normal BiVICD function though device has reached ERI. See Pace Art report No changes today Good response to CRT.  Repeat echo to evaluate EF (echo 2016 reviewed today)  Risks, benefits, and alternatives to BiVICD pulse generator replacement were discussed in detail today.  The patient understands that risks include but are not limited to bleeding, infection, pneumothorax, perforation, tamponade, vascular damage, renal failure, MI, stroke, death, inappropriate shocks, damage to his existing leads, and lead dislodgement and wishes to proceed at this time.   I have instructed him to hold coumadin for 48 hours prior to the procedure and hold plavix for 5 days prior if possible.  2. Hypertensive cardiovascular disease Stable No change required today  3. CAD Stable No change required  today As he is on coumadin, would be nice to stop plavix if possible.  Will defer to Dr  4. Afib Continue long term anticoagualtion Maintaining sinus   2017 MD, [facility] 02/26/2017 12:09 PM

## 2017-02-26 NOTE — Patient Instructions (Signed)
Medication Instructions:  Continue all current medications.  Labwork:  BMET, CBC, PT/INR - orders given today.  Office will contact with results via phone or letter.    Testing/Procedures:  Your physician has requested that you have an echocardiogram. Echocardiography is a painless test that uses sound waves to create images of your heart. It provides your doctor with information about the size and shape of your heart and how well your heart's chambers and valves are working. This procedure takes approximately one hour. There are no restrictions for this procedure.  Office will contact with results via phone or letter.    Generator change out - scheduled for 03/18/2017.    Follow-Up: Will be given at time of discharge from generator change.    Any Other Special Instructions Will Be Listed Below (If Applicable).  If you need a refill on your cardiac medications before your next appointment, please call your pharmacy.

## 2017-03-01 DIAGNOSIS — M47817 Spondylosis without myelopathy or radiculopathy, lumbosacral region: Secondary | ICD-10-CM | POA: Diagnosis not present

## 2017-03-01 DIAGNOSIS — M533 Sacrococcygeal disorders, not elsewhere classified: Secondary | ICD-10-CM | POA: Diagnosis not present

## 2017-03-01 DIAGNOSIS — G894 Chronic pain syndrome: Secondary | ICD-10-CM | POA: Diagnosis not present

## 2017-03-01 DIAGNOSIS — M791 Myalgia: Secondary | ICD-10-CM | POA: Diagnosis not present

## 2017-03-01 DIAGNOSIS — Z79891 Long term (current) use of opiate analgesic: Secondary | ICD-10-CM | POA: Diagnosis not present

## 2017-03-01 DIAGNOSIS — M5416 Radiculopathy, lumbar region: Secondary | ICD-10-CM | POA: Diagnosis not present

## 2017-03-02 ENCOUNTER — Ambulatory Visit (INDEPENDENT_AMBULATORY_CARE_PROVIDER_SITE_OTHER): Payer: PPO

## 2017-03-02 ENCOUNTER — Ambulatory Visit (INDEPENDENT_AMBULATORY_CARE_PROVIDER_SITE_OTHER): Payer: PPO | Admitting: Rheumatology

## 2017-03-02 ENCOUNTER — Encounter: Payer: Self-pay | Admitting: Rheumatology

## 2017-03-02 VITALS — BP 130/74 | Resp 14 | Ht 68.0 in | Wt 256.0 lb

## 2017-03-02 DIAGNOSIS — I255 Ischemic cardiomyopathy: Secondary | ICD-10-CM | POA: Diagnosis not present

## 2017-03-02 DIAGNOSIS — M79642 Pain in left hand: Secondary | ICD-10-CM | POA: Diagnosis not present

## 2017-03-02 DIAGNOSIS — M79672 Pain in left foot: Secondary | ICD-10-CM

## 2017-03-02 DIAGNOSIS — Z87891 Personal history of nicotine dependence: Secondary | ICD-10-CM

## 2017-03-02 DIAGNOSIS — I2510 Atherosclerotic heart disease of native coronary artery without angina pectoris: Secondary | ICD-10-CM | POA: Diagnosis not present

## 2017-03-02 DIAGNOSIS — M79671 Pain in right foot: Secondary | ICD-10-CM

## 2017-03-02 DIAGNOSIS — Z95810 Presence of automatic (implantable) cardiac defibrillator: Secondary | ICD-10-CM | POA: Diagnosis not present

## 2017-03-02 DIAGNOSIS — M79641 Pain in right hand: Secondary | ICD-10-CM

## 2017-03-02 DIAGNOSIS — Z8639 Personal history of other endocrine, nutritional and metabolic disease: Secondary | ICD-10-CM

## 2017-03-02 DIAGNOSIS — Z79899 Other long term (current) drug therapy: Secondary | ICD-10-CM | POA: Diagnosis not present

## 2017-03-02 DIAGNOSIS — R5383 Other fatigue: Secondary | ICD-10-CM

## 2017-03-02 DIAGNOSIS — I5022 Chronic systolic (congestive) heart failure: Secondary | ICD-10-CM

## 2017-03-02 DIAGNOSIS — N183 Chronic kidney disease, stage 3 unspecified: Secondary | ICD-10-CM

## 2017-03-02 DIAGNOSIS — I480 Paroxysmal atrial fibrillation: Secondary | ICD-10-CM

## 2017-03-02 DIAGNOSIS — Z96653 Presence of artificial knee joint, bilateral: Secondary | ICD-10-CM

## 2017-03-02 DIAGNOSIS — I110 Hypertensive heart disease with heart failure: Secondary | ICD-10-CM | POA: Diagnosis not present

## 2017-03-02 LAB — CUP PACEART INCLINIC DEVICE CHECK
Battery Voltage: 2.6 V
Brady Statistic AS VP Percent: 94.93 %
Brady Statistic RA Percent Paced: 4.91 %
HIGH POWER IMPEDANCE MEASURED VALUE: 48 Ohm
HighPow Impedance: 361 Ohm
HighPow Impedance: 60 Ohm
Implantable Lead Implant Date: 20120620
Implantable Lead Location: 753858
Implantable Lead Location: 753860
Implantable Lead Model: 4196
Implantable Lead Model: 5076
Lead Channel Impedance Value: 1121 Ohm
Lead Channel Impedance Value: 456 Ohm
Lead Channel Pacing Threshold Amplitude: 0.625 V
Lead Channel Pacing Threshold Pulse Width: 0.4 ms
Lead Channel Sensing Intrinsic Amplitude: 1 mV
Lead Channel Setting Sensing Sensitivity: 0.3 mV
MDC IDC LEAD IMPLANT DT: 20120620
MDC IDC LEAD IMPLANT DT: 20120620
MDC IDC LEAD LOCATION: 753859
MDC IDC MSMT LEADCHNL LV IMPEDANCE VALUE: 589 Ohm
MDC IDC MSMT LEADCHNL LV IMPEDANCE VALUE: 703 Ohm
MDC IDC MSMT LEADCHNL LV PACING THRESHOLD AMPLITUDE: 0.875 V
MDC IDC MSMT LEADCHNL LV PACING THRESHOLD PULSEWIDTH: 0.4 ms
MDC IDC MSMT LEADCHNL RA PACING THRESHOLD PULSEWIDTH: 0.4 ms
MDC IDC MSMT LEADCHNL RA SENSING INTR AMPL: 0.625 mV
MDC IDC MSMT LEADCHNL RV IMPEDANCE VALUE: 418 Ohm
MDC IDC MSMT LEADCHNL RV PACING THRESHOLD AMPLITUDE: 0.75 V
MDC IDC MSMT LEADCHNL RV SENSING INTR AMPL: 7.875 mV
MDC IDC MSMT LEADCHNL RV SENSING INTR AMPL: 8.875 mV
MDC IDC PG IMPLANT DT: 20120620
MDC IDC SESS DTM: 20180413162514
MDC IDC SET LEADCHNL LV PACING AMPLITUDE: 2 V
MDC IDC SET LEADCHNL LV PACING PULSEWIDTH: 0.4 ms
MDC IDC SET LEADCHNL RA PACING AMPLITUDE: 2 V
MDC IDC SET LEADCHNL RV PACING AMPLITUDE: 2.5 V
MDC IDC SET LEADCHNL RV PACING PULSEWIDTH: 0.4 ms
MDC IDC STAT BRADY AP VP PERCENT: 4.75 %
MDC IDC STAT BRADY AP VS PERCENT: 0.18 %
MDC IDC STAT BRADY AS VS PERCENT: 0.15 %
MDC IDC STAT BRADY RV PERCENT PACED: 98.54 %

## 2017-03-03 ENCOUNTER — Ambulatory Visit (INDEPENDENT_AMBULATORY_CARE_PROVIDER_SITE_OTHER): Payer: PPO

## 2017-03-03 ENCOUNTER — Other Ambulatory Visit: Payer: Self-pay

## 2017-03-03 DIAGNOSIS — R809 Proteinuria, unspecified: Secondary | ICD-10-CM | POA: Diagnosis not present

## 2017-03-03 DIAGNOSIS — N183 Chronic kidney disease, stage 3 (moderate): Secondary | ICD-10-CM | POA: Diagnosis not present

## 2017-03-03 DIAGNOSIS — I5022 Chronic systolic (congestive) heart failure: Secondary | ICD-10-CM | POA: Diagnosis not present

## 2017-03-03 DIAGNOSIS — E559 Vitamin D deficiency, unspecified: Secondary | ICD-10-CM | POA: Diagnosis not present

## 2017-03-03 DIAGNOSIS — Z79899 Other long term (current) drug therapy: Secondary | ICD-10-CM | POA: Diagnosis not present

## 2017-03-03 DIAGNOSIS — D509 Iron deficiency anemia, unspecified: Secondary | ICD-10-CM | POA: Diagnosis not present

## 2017-03-03 DIAGNOSIS — I10 Essential (primary) hypertension: Secondary | ICD-10-CM | POA: Diagnosis not present

## 2017-03-03 LAB — CK: Total CK: 115 U/L (ref 7–232)

## 2017-03-03 LAB — CYCLIC CITRUL PEPTIDE ANTIBODY, IGG

## 2017-03-03 LAB — ANA: Anti Nuclear Antibody(ANA): NEGATIVE

## 2017-03-03 LAB — IGG, IGA, IGM
IGG (IMMUNOGLOBIN G), SERUM: 1229 mg/dL (ref 694–1618)
IgA: 253 mg/dL (ref 81–463)
IgM, Serum: 54 mg/dL (ref 48–271)

## 2017-03-03 LAB — GLUCOSE 6 PHOSPHATE DEHYDROGENASE: G-6PDH: 19.9 U/g{Hb} (ref 7.0–20.5)

## 2017-03-03 LAB — HEPATITIS B CORE ANTIBODY, IGM: HEP B C IGM: NONREACTIVE

## 2017-03-03 LAB — URIC ACID: Uric Acid, Serum: 10.3 mg/dL — ABNORMAL HIGH (ref 4.0–8.0)

## 2017-03-03 LAB — HEPATITIS C ANTIBODY: HCV AB: NEGATIVE

## 2017-03-03 LAB — HEPATITIS B SURFACE ANTIGEN: Hepatitis B Surface Ag: NEGATIVE

## 2017-03-04 LAB — PROTEIN ELECTROPHORESIS, SERUM, WITH REFLEX
ALBUMIN ELP: 3.9 g/dL (ref 3.8–4.8)
Alpha-1-Globulin: 0.3 g/dL (ref 0.2–0.3)
Alpha-2-Globulin: 0.8 g/dL (ref 0.5–0.9)
BETA 2: 0.4 g/dL (ref 0.2–0.5)
BETA GLOBULIN: 0.4 g/dL (ref 0.4–0.6)
GAMMA GLOBULIN: 1.2 g/dL (ref 0.8–1.7)
TOTAL PROTEIN, SERUM ELECTROPHOR: 7 g/dL (ref 6.1–8.1)

## 2017-03-04 LAB — QUANTIFERON TB GOLD ASSAY (BLOOD)
INTERFERON GAMMA RELEASE ASSAY: NEGATIVE
Mitogen-Nil: >10 IU/mL
QUANTIFERON NIL VALUE: 0.03 [IU]/mL
Quantiferon Tb Ag Minus Nil Value: 0.01 IU/mL

## 2017-03-04 NOTE — Progress Notes (Signed)
Will discuss at fu visit.MAy fax labs to PCP. High Uric acid c/w gout.

## 2017-03-05 DIAGNOSIS — Z713 Dietary counseling and surveillance: Secondary | ICD-10-CM | POA: Diagnosis not present

## 2017-03-05 DIAGNOSIS — Z6838 Body mass index (BMI) 38.0-38.9, adult: Secondary | ICD-10-CM | POA: Diagnosis not present

## 2017-03-05 DIAGNOSIS — N183 Chronic kidney disease, stage 3 (moderate): Secondary | ICD-10-CM | POA: Diagnosis not present

## 2017-03-05 DIAGNOSIS — I428 Other cardiomyopathies: Secondary | ICD-10-CM | POA: Diagnosis not present

## 2017-03-05 DIAGNOSIS — I4891 Unspecified atrial fibrillation: Secondary | ICD-10-CM | POA: Diagnosis not present

## 2017-03-05 DIAGNOSIS — L97509 Non-pressure chronic ulcer of other part of unspecified foot with unspecified severity: Secondary | ICD-10-CM | POA: Diagnosis not present

## 2017-03-05 DIAGNOSIS — Z299 Encounter for prophylactic measures, unspecified: Secondary | ICD-10-CM | POA: Diagnosis not present

## 2017-03-05 DIAGNOSIS — E1122 Type 2 diabetes mellitus with diabetic chronic kidney disease: Secondary | ICD-10-CM | POA: Diagnosis not present

## 2017-03-09 ENCOUNTER — Telehealth: Payer: Self-pay | Admitting: *Deleted

## 2017-03-09 DIAGNOSIS — E1129 Type 2 diabetes mellitus with other diabetic kidney complication: Secondary | ICD-10-CM | POA: Diagnosis not present

## 2017-03-09 DIAGNOSIS — I10 Essential (primary) hypertension: Secondary | ICD-10-CM | POA: Diagnosis not present

## 2017-03-09 DIAGNOSIS — I509 Heart failure, unspecified: Secondary | ICD-10-CM | POA: Diagnosis not present

## 2017-03-09 DIAGNOSIS — R809 Proteinuria, unspecified: Secondary | ICD-10-CM | POA: Diagnosis not present

## 2017-03-09 DIAGNOSIS — N250 Renal osteodystrophy: Secondary | ICD-10-CM | POA: Diagnosis not present

## 2017-03-09 DIAGNOSIS — N183 Chronic kidney disease, stage 3 (moderate): Secondary | ICD-10-CM | POA: Diagnosis not present

## 2017-03-09 DIAGNOSIS — D649 Anemia, unspecified: Secondary | ICD-10-CM | POA: Diagnosis not present

## 2017-03-09 NOTE — Telephone Encounter (Signed)
Notes recorded by Lesle Chris, LPN on 7/51/7001 at 2:59 PM EDT Patient notified. Copy to pmd. ------  Notes recorded by Hillis Range, MD on 03/08/2017 at 10:21 PM EDT Results reviewed. Tresa Endo, please inform pt of result. I will route to primary care also.

## 2017-03-12 DIAGNOSIS — I5022 Chronic systolic (congestive) heart failure: Secondary | ICD-10-CM | POA: Diagnosis not present

## 2017-03-12 DIAGNOSIS — N400 Enlarged prostate without lower urinary tract symptoms: Secondary | ICD-10-CM | POA: Diagnosis not present

## 2017-03-12 DIAGNOSIS — E785 Hyperlipidemia, unspecified: Secondary | ICD-10-CM | POA: Diagnosis not present

## 2017-03-12 DIAGNOSIS — L97509 Non-pressure chronic ulcer of other part of unspecified foot with unspecified severity: Secondary | ICD-10-CM | POA: Diagnosis not present

## 2017-03-12 DIAGNOSIS — Z01812 Encounter for preprocedural laboratory examination: Secondary | ICD-10-CM | POA: Diagnosis not present

## 2017-03-12 DIAGNOSIS — E669 Obesity, unspecified: Secondary | ICD-10-CM | POA: Diagnosis not present

## 2017-03-12 DIAGNOSIS — I428 Other cardiomyopathies: Secondary | ICD-10-CM | POA: Diagnosis not present

## 2017-03-12 DIAGNOSIS — I4891 Unspecified atrial fibrillation: Secondary | ICD-10-CM | POA: Diagnosis not present

## 2017-03-12 DIAGNOSIS — Z6838 Body mass index (BMI) 38.0-38.9, adult: Secondary | ICD-10-CM | POA: Diagnosis not present

## 2017-03-12 DIAGNOSIS — Z299 Encounter for prophylactic measures, unspecified: Secondary | ICD-10-CM | POA: Diagnosis not present

## 2017-03-12 DIAGNOSIS — I739 Peripheral vascular disease, unspecified: Secondary | ICD-10-CM | POA: Diagnosis not present

## 2017-03-12 DIAGNOSIS — E039 Hypothyroidism, unspecified: Secondary | ICD-10-CM | POA: Diagnosis not present

## 2017-03-12 DIAGNOSIS — N183 Chronic kidney disease, stage 3 (moderate): Secondary | ICD-10-CM | POA: Diagnosis not present

## 2017-03-12 LAB — PROTIME-INR

## 2017-03-18 ENCOUNTER — Ambulatory Visit (HOSPITAL_COMMUNITY)
Admission: RE | Disposition: A | Discharge status: Home / Self Care | Payer: Self-pay | Source: Ambulatory Visit | Attending: Internal Medicine

## 2017-03-18 ENCOUNTER — Ambulatory Visit (HOSPITAL_COMMUNITY)
Admission: RE | Admit: 2017-03-18 | Discharge: 2017-03-18 | Disposition: A | Discharge status: Home / Self Care | Payer: PPO | Source: Ambulatory Visit | Attending: Internal Medicine | Admitting: Internal Medicine

## 2017-03-18 DIAGNOSIS — D631 Anemia in chronic kidney disease: Secondary | ICD-10-CM | POA: Insufficient documentation

## 2017-03-18 DIAGNOSIS — E785 Hyperlipidemia, unspecified: Secondary | ICD-10-CM | POA: Insufficient documentation

## 2017-03-18 DIAGNOSIS — Z6838 Body mass index (BMI) 38.0-38.9, adult: Secondary | ICD-10-CM | POA: Insufficient documentation

## 2017-03-18 DIAGNOSIS — Z7901 Long term (current) use of anticoagulants: Secondary | ICD-10-CM | POA: Insufficient documentation

## 2017-03-18 DIAGNOSIS — Z4502 Encounter for adjustment and management of automatic implantable cardiac defibrillator: Secondary | ICD-10-CM | POA: Insufficient documentation

## 2017-03-18 DIAGNOSIS — I130 Hypertensive heart and chronic kidney disease with heart failure and stage 1 through stage 4 chronic kidney disease, or unspecified chronic kidney disease: Secondary | ICD-10-CM | POA: Insufficient documentation

## 2017-03-18 DIAGNOSIS — I480 Paroxysmal atrial fibrillation: Secondary | ICD-10-CM | POA: Insufficient documentation

## 2017-03-18 DIAGNOSIS — Z7902 Long term (current) use of antithrombotics/antiplatelets: Secondary | ICD-10-CM | POA: Diagnosis not present

## 2017-03-18 DIAGNOSIS — E1122 Type 2 diabetes mellitus with diabetic chronic kidney disease: Secondary | ICD-10-CM | POA: Insufficient documentation

## 2017-03-18 DIAGNOSIS — I447 Left bundle-branch block, unspecified: Secondary | ICD-10-CM | POA: Diagnosis not present

## 2017-03-18 DIAGNOSIS — Z8614 Personal history of Methicillin resistant Staphylococcus aureus infection: Secondary | ICD-10-CM | POA: Diagnosis not present

## 2017-03-18 DIAGNOSIS — N400 Enlarged prostate without lower urinary tract symptoms: Secondary | ICD-10-CM | POA: Diagnosis not present

## 2017-03-18 DIAGNOSIS — I255 Ischemic cardiomyopathy: Secondary | ICD-10-CM | POA: Insufficient documentation

## 2017-03-18 DIAGNOSIS — Z955 Presence of coronary angioplasty implant and graft: Secondary | ICD-10-CM | POA: Insufficient documentation

## 2017-03-18 DIAGNOSIS — Z794 Long term (current) use of insulin: Secondary | ICD-10-CM | POA: Diagnosis not present

## 2017-03-18 DIAGNOSIS — I5022 Chronic systolic (congestive) heart failure: Secondary | ICD-10-CM | POA: Diagnosis not present

## 2017-03-18 DIAGNOSIS — N183 Chronic kidney disease, stage 3 (moderate): Secondary | ICD-10-CM | POA: Diagnosis not present

## 2017-03-18 DIAGNOSIS — I2510 Atherosclerotic heart disease of native coronary artery without angina pectoris: Secondary | ICD-10-CM | POA: Diagnosis not present

## 2017-03-18 DIAGNOSIS — Z8673 Personal history of transient ischemic attack (TIA), and cerebral infarction without residual deficits: Secondary | ICD-10-CM | POA: Diagnosis not present

## 2017-03-18 DIAGNOSIS — I252 Old myocardial infarction: Secondary | ICD-10-CM | POA: Diagnosis not present

## 2017-03-18 HISTORY — PX: BIV ICD GENERATOR CHANGEOUT: EP1194

## 2017-03-18 LAB — SURGICAL PCR SCREEN
MRSA, PCR: NEGATIVE
Staphylococcus aureus: POSITIVE — AB

## 2017-03-18 LAB — GLUCOSE, CAPILLARY: GLUCOSE-CAPILLARY: 185 mg/dL — AB (ref 65–99)

## 2017-03-18 LAB — PROTIME-INR
INR: 1.76
PROTHROMBIN TIME: 20.8 s — AB (ref 11.4–15.2)

## 2017-03-18 SURGERY — BIV ICD GENERATOR CHANGEOUT
Anesthesia: LOCAL

## 2017-03-18 MED ORDER — LIDOCAINE HCL (PF) 1 % IJ SOLN
INTRAMUSCULAR | Status: AC
  Filled 2017-03-18: qty 60

## 2017-03-18 MED ORDER — SODIUM CHLORIDE 0.9 % IV SOLN
INTRAVENOUS | Status: DC
  Administered 2017-03-18: 13:00:00 via INTRAVENOUS

## 2017-03-18 MED ORDER — CEFAZOLIN SODIUM-DEXTROSE 2-4 GM/100ML-% IV SOLN
2.0000 g | INTRAVENOUS | Status: AC
  Administered 2017-03-18: 2 g via INTRAVENOUS
  Filled 2017-03-18: qty 100

## 2017-03-18 MED ORDER — SODIUM CHLORIDE 0.9 % IR SOLN
Status: AC
  Filled 2017-03-18: qty 2

## 2017-03-18 MED ORDER — SODIUM CHLORIDE 0.9 % IR SOLN
80.0000 mg | Status: AC
  Administered 2017-03-18: 80 mg

## 2017-03-18 MED ORDER — MUPIROCIN 2 % EX OINT
1.0000 "application " | TOPICAL_OINTMENT | Freq: Once | CUTANEOUS | Status: AC
  Administered 2017-03-18: 1 via TOPICAL

## 2017-03-18 MED ORDER — MIDAZOLAM HCL 5 MG/5ML IJ SOLN
INTRAMUSCULAR | Status: AC
  Filled 2017-03-18: qty 5

## 2017-03-18 MED ORDER — LIDOCAINE HCL (PF) 1 % IJ SOLN
INTRAMUSCULAR | Status: DC | PRN
  Administered 2017-03-18: 45 mL

## 2017-03-18 MED ORDER — SODIUM CHLORIDE 0.9% FLUSH: 3.0000 mL | INTRAVENOUS | Status: DC | PRN

## 2017-03-18 MED ORDER — WARFARIN SODIUM 5 MG PO TABS: 2.5000 mg | ORAL_TABLET | Freq: Every day | ORAL | Status: DC

## 2017-03-18 MED ORDER — ACETAMINOPHEN 325 MG PO TABS: 325.0000 mg | ORAL_TABLET | ORAL | Status: DC | PRN

## 2017-03-18 MED ORDER — CHLORHEXIDINE GLUCONATE 4 % EX LIQD: 60.0000 mL | Freq: Once | CUTANEOUS | Status: DC

## 2017-03-18 MED ORDER — CLOPIDOGREL BISULFATE 75 MG PO TABS: 75.0000 mg | ORAL_TABLET | Freq: Every day | ORAL | 6 refills | Status: AC

## 2017-03-18 MED ORDER — SODIUM CHLORIDE 0.9 % IV SOLN: 250.0000 mL | INTRAVENOUS | Status: DC | PRN

## 2017-03-18 MED ORDER — ONDANSETRON HCL 4 MG/2ML IJ SOLN: 4.0000 mg | Freq: Four times a day (QID) | INTRAMUSCULAR | Status: DC | PRN

## 2017-03-18 MED ORDER — SODIUM CHLORIDE 0.9% FLUSH: 3.0000 mL | Freq: Two times a day (BID) | INTRAVENOUS | Status: DC

## 2017-03-18 MED ORDER — MUPIROCIN 2 % EX OINT
TOPICAL_OINTMENT | CUTANEOUS | Status: AC
  Filled 2017-03-18: qty 22

## 2017-03-18 MED ORDER — MUPIROCIN 2 % EX OINT
TOPICAL_OINTMENT | CUTANEOUS | Status: AC
  Administered 2017-03-18: 1 via TOPICAL
  Filled 2017-03-18: qty 22

## 2017-03-18 MED ORDER — FENTANYL CITRATE (PF) 100 MCG/2ML IJ SOLN
INTRAMUSCULAR | Status: AC
  Filled 2017-03-18: qty 2

## 2017-03-18 MED ORDER — CEFAZOLIN SODIUM-DEXTROSE 2-4 GM/100ML-% IV SOLN
INTRAVENOUS | Status: AC
  Filled 2017-03-18: qty 100

## 2017-03-18 SURGICAL SUPPLY — 4 items
CABLE SURGICAL S-101-97-12 (CABLE) ×2 IMPLANT
ICD CLARIA MRI DTMA1D1 (ICD Generator) ×2 IMPLANT
PAD DEFIB LIFELINK (PAD) ×2 IMPLANT
TRAY PACEMAKER INSERTION (PACKS) ×2 IMPLANT

## 2017-03-18 NOTE — Interval H&P Note (Signed)
History and Physical Interval Note:  03/18/2017 11:59 AM  [name]  has presented today for surgery, with the diagnosis of eri  The various methods of treatment have been discussed with the patient and family. After consideration of risks, benefits and other options for treatment, the patient has consented to  Procedure(s): BiV ICD Generator Changeout (N/A) as a surgical intervention .  The patient's history has been reviewed, patient examined, no change in status, stable for surgery.  I have reviewed the patient's chart and labs.  Questions were answered to the patient's satisfaction.    ICD Criteria  Current LVEF:55%. Within 12 months prior to implant: Yes   Heart failure history: Yes, Class II  Cardiomyopathy history: Yes, Non-Ischemic Cardiomyopathy.  Atrial Fibrillation/Atrial Flutter: Yes, Paroxysmal.  Ventricular tachycardia history: No.  Cardiac arrest history: No.  History of syndromes with risk of sudden death: No.  Previous ICD: Yes, Reason for ICD:  Primary prevention.  Current ICD indication: Primary  PPM indication: BiV pacing (CRT)  Prior response to CRT with EF 25%--> >55%  Beta Blocker therapy for 3 or more months: Yes, prescribed.   Ace Inhibitor/ARB therapy for 3 or more months: No, medical reason.  Chronic renal failure     Hillis Range

## 2017-03-18 NOTE — Discharge Instructions (Signed)
OK to resume coumadin tomorrow evening Resume plavix in 1 week Mupirocin nasal ointment What is this medicine? MUPIROCIN CALCIUM (myoo PEER oh sin KAL see um) is an antibiotic. It is used inside the nose to treat infections that are caused by certain bacteria. This helps prevent the spread of infection to patients and health care workers during outbreaks at institutions. This medicine may be used for other purposes; ask your health care provider or pharmacist if you have questions. COMMON BRAND NAME(S): Bactroban What should I tell my health care provider before I take this medicine? They need to know if you have any of these conditions: -an unusual or allergic reaction to mupirocin, other medicines, foods, dyes, or preservatives -pregnant or trying to get pregnant -breast-feeding How should I use this medicine? This medicine is only for use inside the nose. Follow the directions on the prescription label. Wash your hands before and after use. Squeeze half the contents of a single-use tube into one nostril, then squeeze the other half into the other nostril. Press the sides of your nose together and gently massage after application to spread the ointment throughout the nostrils. Do not use your medicine more often than directed. Finish the full course of medicine prescribed by your doctor or health care professional even if you think your condition is better. Talk to your pediatrician regarding the use of this medicine in children. Special care may be needed. Overdosage: If you think you have taken too much of this medicine contact a poison control center or emergency room at once. NOTE: This medicine is only for you. Do not share this medicine with others. What if I miss a dose? If you miss a dose, take it as soon as you can. If it is almost time for your next dose, take only that dose. Do not take double or extra doses. What may interact with this medicine? Interactions are not expected. Do not  use any other nose products without telling your doctor or health care professional. This list may not describe all possible interactions. Give your health care provider a list of all the medicines, herbs, non-prescription drugs, or dietary supplements you use. Also tell them if you smoke, drink alcohol, or use illegal drugs. Some items may interact with your medicine. What should I watch for while using this medicine? If your nose is severely irritated, burning or stinging from use of this medicine, stop using it and contact your doctor or health care professional. Do not get this medicine in your eyes. If you do, rinse out with plenty of cool tap water. What side effects may I notice from receiving this medicine? Side effects that you should report to your doctor or health care professional as soon as possible: -severe irritation, burning, stinging, or pain Side effects that usually do not require medical attention (report to your doctor or health care professional if they continue or are bothersome): -altered taste -cough -headache -skin itching -sore throat -stuffy or runny nose This list may not describe all possible side effects. Call your doctor for medical advice about side effects. You may report side effects to FDA at 1-800-FDA-1088. Where should I keep my medicine? Keep out of the reach of children. Store at room temperature between 15 and 30 degrees C (59 and 86 degrees F). Do not refrigerate. One tube of ointment is for single use in both nostrils. Throw away after use. NOTE: This sheet is a summary. It may not cover all possible information. If you  have questions about this medicine, talk to your doctor, pharmacist, or health care provider.  2018 Elsevier/Gold Standard (2008-05-21 14:36:10)   Pacemaker Battery Change, Care After This sheet gives you information about how to care for yourself after your procedure. Your health care provider may also give you more specific  instructions. If you have problems or questions, contact your health care provider. What can I expect after the procedure? After your procedure, it is common to have:  Pain or soreness at the site where the pacemaker was inserted.  Swelling at the site where the pacemaker was inserted. Follow these instructions at home: Incision care   Keep the incision clean and dry.  Do not take baths, swim, or use a hot tub until your health care provider approves.  You may shower the day after your procedure, or as directed by your health care provider.  Pat the area dry with a clean towel. Do not rub the area. This may cause bleeding.  Follow instructions from your health care provider about how to take care of your incision. Make sure you:  Wash your hands with soap and water before you change your bandage (dressing). If soap and water are not available, use hand sanitizer.  Change your dressing as told by your health care provider.  Leave stitches (sutures), skin glue, or adhesive strips in place. These skin closures may need to stay in place for 2 weeks or longer. If adhesive strip edges start to loosen and curl up, you may trim the loose edges. Do not remove adhesive strips completely unless your health care provider tells you to do that.  Check your incision area every day for signs of infection. Check for:  More redness, swelling, or pain.  More fluid or blood.  Warmth.  Pus or a bad smell. Activity   Do not lift anything that is heavier than 10 lb (4.5 kg) until your health care provider says it is okay to do so.  For the first 2 weeks, or as long as told by your health care provider:  Avoid lifting your left arm higher than your shoulder.  Be gentle when you move your arms over your head. It is okay to raise your arm to comb your hair.  Avoid strenuous exercise.  Ask your health care provider when it is okay to:  Resume your normal activities.  Return to work or  school.  Resume sexual activity. Eating and drinking   Eat a heart-healthy diet. This should include plenty of fresh fruits and vegetables, whole grains, low-fat dairy products, and lean protein like chicken and fish.  Limit alcohol intake to no more than 1 drink a day for non-pregnant women and 2 drinks a day for men. One drink equals 12 oz of beer, 5 oz of wine, or 1 oz of hard liquor.  Check ingredients and nutrition facts on packaged foods and beverages. Avoid the following types of food:  Food that is high in salt (sodium).  Food that is high in saturated fat, like full-fat dairy or red meat.  Food that is high in trans fat, like fried food.  Food and drinks that are high in sugar. Lifestyle   Do not use any products that contain nicotine or tobacco, such as cigarettes and e-cigarettes. If you need help quitting, ask your health care provider.  Take steps to manage and control your weight.  Get regular exercise. Aim for 150 minutes of moderate-intensity exercise (such as walking or yoga) or 75  minutes of vigorous exercise (such as running or swimming) each week.  Manage other health problems, such as diabetes or high blood pressure. Ask your health care provider how you can manage these conditions. General instructions   Do not drive for 24 hours after your procedure if you were given a medicine to help you relax (sedative).  Take over-the-counter and prescription medicines only as told by your health care provider.  Avoid putting pressure on the area where the pacemaker was placed.  If you need an MRI after your pacemaker has been placed, be sure to tell the health care provider who orders the MRI that you have a pacemaker.  Avoid close and prolonged exposure to electrical devices that have strong magnetic fields. These include:  Cell phones. Avoid keeping them in a pocket near the pacemaker, and try using the ear opposite the pacemaker.  MP3 players.  Household  appliances, like microwaves.  Metal detectors.  Electric generators.  High-tension wires.  Keep all follow-up visits as directed by your health care provider. This is important. Contact a health care provider if:  You have pain at the incision site that is not relieved by over-the-counter or prescription medicines.  You have any of these around your incision site or coming from it:  More redness, swelling, or pain.  Fluid or blood.  Warmth to the touch.  Pus or a bad smell.  You have a fever.  You feel brief, occasional palpitations, light-headedness, or any symptoms that you think might be related to your heart. Get help right away if:  You experience chest pain that is different from the pain at the pacemaker site.  You develop a red streak that extends above or below the incision site.  You experience shortness of breath.  You have palpitations or an irregular heartbeat.  You have light-headedness that does not go away quickly.  You faint or have dizzy spells.  Your pulse suddenly drops or increases rapidly and does not return to normal.  You begin to gain weight and your legs and ankles swell. Summary  After your procedure, it is common to have pain, soreness, and some swelling where the pacemaker was inserted.  Make sure to keep your incision clean and dry. Follow instructions from your health care provider about how to take care of your incision.  Check your incision every day for signs of infection, such as more pain or swelling, pus or a bad smell, warmth, or leaking fluid and blood.  Avoid strenuous exercise and lifting your left arm higher than your shoulder for 2 weeks, or as long as told by your health care provider. This information is not intended to replace advice given to you by your health care provider. Make sure you discuss any questions you have with your health care provider. Document Released: 08/23/2013 Document Revised: 09/24/2016 Document  Reviewed: 09/24/2016 Elsevier Interactive Patient Education  2017 [employer].

## 2017-03-18 NOTE — H&P (View-Only) (Signed)
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  His ICD has reached elective replacement time.  He remains active.  He still goes dancing three nights per week.  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  Today, he denies symptoms of palpitations, chest pain, shortness of breath,  lower extremity edema, dizziness, presyncope, syncope, or ICD shocks.  The patient is otherwise without complaint today.  He says that his renal function is stable.   Past Medical History:  Diagnosis Date  . Anemia   . Anemia-chronic    a. Pt reports h/o anemia - was offered IV iron in past by nephrologist but declined and has taken PO instead.  . Aneurysm, cerebral   . BPH (benign prostatic hypertrophy)   . Cardiomyopathy, ischemic    LVEF 25-35%.  . Chronic kidney disease, stage III (moderate)    Followed by Dr. Fausto Skillern.  . Coronary atherosclerosis of native coronary artery    a. DES to RCA 03/2009. b. s/p PTCA to RCA for ISR, 05/2010. c. 03/2013 NSTEMI 2/2 severe prox RCA s/p PTCA/DES, med rx for residual dz.  . Essential hypertension, benign   . Hyperlipidemia   . LBBB (left bundle branch block)    s/p BiV ICD Implanted by Dr Graciela Husbands  . Morbid obesity (HCC)   . MRSA (methicillin resistant Staphylococcus aureus)   . Myocardial infarction   . Paroxysmal atrial fibrillation (HCC)    a. Coumadin discontinue in 2010 when the patient required ASA/Plavix for stenting. b. Coumadin restarted 05/2013, Plavix continued, ASA stopped.   . Pulmonary nodule    CXR, 03/2013, [facility] - not described on any subsequent chest x-rays however, could be a vascular structure  . TIA (transient ischemic attack)    Multiple TIAs  . Type 2 diabetes mellitus (HCC)    Past Surgical History:  Procedure Laterality Date  . CHOLECYSTECTOMY    . ICD placement  May 06, 2011   Medtronic Protecta XT CRT-D  . LEFT HEART CATHETERIZATION WITH  CORONARY ANGIOGRAM N/A 04/17/2013   Procedure: LEFT HEART CATHETERIZATION WITH CORONARY ANGIOGRAM;  Surgeon: Vesta Mixer, MD;  Location: [facility] CATH LAB;  Service: Cardiovascular;  Laterality: N/A;  . LUNG SURGERY     23 - 24 yrs. old  . TOTAL KNEE ARTHROPLASTY    . VASECTOMY      Current Outpatient Prescriptions  Medication Sig Dispense Refill  . carvedilol (COREG) 12.5 MG tablet TAKE ONE AND ONE-HALF TABLETS BY MOUTH TWICE DAILY WITH MEALS (DUE FOR FOLLOW UP) 270 tablet 3  . cholecalciferol (VITAMIN D) 1000 UNITS tablet Take 1,000 Units by mouth daily.    . clopidogrel (PLAVIX) 75 MG tablet TAKE ONE TABLET BY MOUTH ONCE DAILY 30 tablet 6  . gabapentin (NEURONTIN) 100 MG capsule Take 100 mg by mouth 2 (two) times daily.    . hydrALAZINE (APRESOLINE) 25 MG tablet TAKE ONE TABLET BY MOUTH THREE TIMES DAILY 270 tablet 3  . HYDROcodone-acetaminophen (NORCO) 10-325 MG per tablet Take 1 tablet by mouth every 8 (eight) hours as needed for pain.     Marland Kitchen insulin lispro protamine-insulin lispro (HUMALOG 75/25) (75-25) 100 UNIT/ML SUSP Inject 15-25 Units into the skin 3 (three) times daily after meals. Will inject between 15 and 25 units each time blood sugar level is checked.    . isosorbide mononitrate (IMDUR) 30 MG 24 hr tablet TAKE ONE TABLET BY  MOUTH ONCE DAILY 30 tablet 6  . L-Methylfolate-Algae-B12-B6 (METANX) 3-90.314-2-35 MG CAPS TAKE ONE CAPSULE BY MOUTH ONCE DAILY 60 capsule 3  . levothyroxine (SYNTHROID, LEVOTHROID) 25 MCG tablet Take 1 tablet by mouth daily.    . lisinopril (PRINIVIL,ZESTRIL) 5 MG tablet Take 2.5 mg by mouth daily.     . loratadine (CLARITIN) 10 MG tablet Take 10 mg by mouth daily.    . nitroGLYCERIN (NITROSTAT) 0.4 MG SL tablet Place 0.4 mg under the tongue every 5 (five) minutes as needed for chest pain. For chest pain    . Omega-3 Fatty Acids (FISH OIL) 1000 MG CAPS Take 1,000 mg by mouth daily.    . Polysaccharide Iron Complex (FERREX 150 PO) Take 150 mg by mouth.    .  torsemide (DEMADEX) 10 MG tablet Take 1 tablet (10 mg total) by mouth 2 (two) times daily. 60 tablet 3  . warfarin (COUMADIN) 5 MG tablet Take 2.5-5 mg by mouth daily at 6 PM. Takes 2.5mg on mon only Takes 5mg all other days    . Zinc 50 MG CAPS Take 50 mg by mouth daily.      No current facility-administered medications for this visit.     Physical Exam: Vitals:   02/26/17 1136  BP: 130/60  Pulse: (!) 55  SpO2: 98%  Weight: 251 lb (113.9 kg)  Height: 5' 8" (1.727 m)    GEN- The patient is overweight appearing, alert and oriented x 3 today.  pleasant Head- normocephalic, atraumatic Eyes-  Sclera clear, conjunctiva pink Ears- hearing intact Oropharynx- clear Lungs- Clear to ausculation bilaterally, normal work of breathing Chest- ICD pocket is well healed Heart- Regular rate and rhythm, no murmurs, rubs or gallops, PMI not laterally displaced GI- soft, NT, ND, + BS Extremities- no clubbing, cyanosis, or edema  ICD interrogation- reviewed in detail today,  See PACEART report  Assessment and Plan:  1.  Chronic systolic dysfunction euvolemic today Stable on an appropriate medical regimen Normal BiVICD function though device has reached ERI. See Pace Art report No changes today Good response to CRT.  Repeat echo to evaluate EF (echo 2016 reviewed today)  Risks, benefits, and alternatives to BiVICD pulse generator replacement were discussed in detail today.  The patient understands that risks include but are not limited to bleeding, infection, pneumothorax, perforation, tamponade, vascular damage, renal failure, MI, stroke, death, inappropriate shocks, damage to his existing leads, and lead dislodgement and wishes to proceed at this time.   I have instructed him to hold coumadin for 48 hours prior to the procedure and hold plavix for 5 days prior if possible.  2. Hypertensive cardiovascular disease Stable No change required today  3. CAD Stable No change required  today As he is on coumadin, would be nice to stop plavix if possible.  Will defer to Dr McDowell  4. Afib Continue long term anticoagualtion Maintaining sinus   Zavion Sleight MD, FACC 02/26/2017 12:09 PM   

## 2017-03-19 ENCOUNTER — Encounter: Payer: PPO | Admitting: Internal Medicine

## 2017-03-19 ENCOUNTER — Encounter (HOSPITAL_COMMUNITY): Payer: Self-pay | Admitting: Internal Medicine

## 2017-03-19 MED FILL — Gentamicin Sulfate Inj 40 MG/ML: INTRAMUSCULAR | Qty: 2 | Status: AC

## 2017-03-19 MED FILL — Sodium Chloride Irrigation Soln 0.9%: Qty: 500 | Status: AC

## 2017-03-24 DIAGNOSIS — Z96653 Presence of artificial knee joint, bilateral: Secondary | ICD-10-CM | POA: Insufficient documentation

## 2017-03-24 DIAGNOSIS — Z79899 Other long term (current) drug therapy: Secondary | ICD-10-CM | POA: Insufficient documentation

## 2017-03-24 DIAGNOSIS — M79672 Pain in left foot: Secondary | ICD-10-CM | POA: Insufficient documentation

## 2017-03-24 DIAGNOSIS — Z8639 Personal history of other endocrine, nutritional and metabolic disease: Secondary | ICD-10-CM | POA: Insufficient documentation

## 2017-03-24 DIAGNOSIS — Z87891 Personal history of nicotine dependence: Secondary | ICD-10-CM | POA: Insufficient documentation

## 2017-03-24 DIAGNOSIS — M79641 Pain in right hand: Secondary | ICD-10-CM | POA: Insufficient documentation

## 2017-03-24 DIAGNOSIS — M79642 Pain in left hand: Principal | ICD-10-CM

## 2017-03-24 DIAGNOSIS — R5383 Other fatigue: Secondary | ICD-10-CM | POA: Insufficient documentation

## 2017-03-24 NOTE — Progress Notes (Deleted)
Office Visit Note  Patient: [name]             Date of Birth: [birth year]           MRN: [id number]             PCP: Tyler Blitz, MD Referring: Tyler Blitz, MD Visit Date: 03/30/2017 Occupation: '@GUAROCC' @    Subjective:    History of Present Illness: [name] is a 76 y.o. male   Activities of Daily Living:  Patient reports morning stiffness for 5 minutes.   Patient Denies nocturnal pain.  Difficulty dressing/grooming: Denies Difficulty climbing stairs: Denies Difficulty getting out of chair: Denies Difficulty using hands for taps, buttons, cutlery, and/or writing: Reports   Review of Systems  Constitutional: Negative for fatigue, night sweats, weight gain, weight loss and weakness.  HENT: Positive for mouth dryness. Negative for mouth sores and nose dryness.   Eyes: Negative for pain, redness, visual disturbance and dryness.  Respiratory: Negative for cough, shortness of breath and difficulty breathing.   Cardiovascular: Negative for chest pain, palpitations, hypertension, irregular heartbeat and swelling in legs/feet.  Gastrointestinal: Negative for blood in stool, constipation and diarrhea.  Endocrine: Negative for increased urination.  Genitourinary: Negative for painful urination.  Musculoskeletal: Positive for arthralgias, joint pain, joint swelling and morning stiffness. Negative for myalgias, muscle tenderness and myalgias.  Skin: Negative for color change, rash, nodules/bumps, redness, skin tightness, ulcers and sensitivity to sunlight.  Allergic/Immunologic: Negative for susceptible to infections.  Neurological: Negative for dizziness, headaches, memory loss and night sweats.  Hematological: Negative for swollen glands.  Psychiatric/Behavioral: Positive for sleep disturbance. Negative for depressed mood. The patient is not nervous/anxious.     PMFS History:  Patient Active Problem List   Diagnosis Date Noted  . Pain in both hands 03/24/2017  . H/O  total knee replacement, bilateral 03/24/2017  . Pain in both feet 03/24/2017  . High risk medication use 03/24/2017  . Other fatigue 03/24/2017  . History of hypothyroidism 03/24/2017  . Former smoker 03/24/2017  . PVD (peripheral vascular disease) with claudication ([place]) 07/11/2014  . TIA (transient ischemic attack) 05/30/2013  . Long term (current) use of anticoagulants 05/30/2013  . CKD (chronic kidney disease) stage 3, GFR 30-59 ml/min 05/29/2013  . Paroxysmal atrial fibrillation (HCC)   . Cardiomyopathy, ischemic   . Biventricular ICD (implantable cardioverter-defibrillator) in place 08/17/2012  . DM 05/15/2010  . Hypertensive cardiovascular disease 04/25/2010  . HYPERLIPIDEMIA-MIXED 08/28/2009  . CAD, NATIVE VESSEL 08/28/2009  . LBBB 08/28/2009  . SYSTOLIC HEART FAILURE, CHRONIC 08/28/2009    Past Medical History:  Diagnosis Date  . Anemia   . Anemia-chronic    a. Pt reports h/o anemia - was offered IV iron in past by nephrologist but declined and has taken PO instead.  . Aneurysm, cerebral   . BPH (benign prostatic hypertrophy)   . Cardiomyopathy, ischemic    LVEF 25-35%.  . Chronic kidney disease, stage III (moderate)    Followed by Dr. Hinda [name].  . Coronary atherosclerosis of native coronary artery    a. DES to RCA 03/2009. b. s/p PTCA to RCA for ISR, 05/2010. c. 03/2013 NSTEMI 2/2 severe prox RCA s/p PTCA/DES, med rx for residual dz.  . Essential hypertension, benign   . Hyperlipidemia   . LBBB (left bundle branch block)    s/p BiV ICD Implanted by Dr Caryl Comes  . Morbid obesity ([place])   . MRSA (methicillin resistant Staphylococcus aureus)   . Myocardial infarction (  [place])   . Paroxysmal atrial fibrillation ([place])    a. Coumadin discontinue in 2010 when the patient required ASA/Plavix for stenting. b. Coumadin restarted 05/2013, Plavix continued, ASA stopped.   . Pulmonary nodule    CXR, 03/2013, [facility] - not described on any subsequent chest x-rays however, could be a vascular  structure  . TIA (transient ischemic attack)    Multiple TIAs  . Type 2 diabetes mellitus (HCC)     Family History  Problem Relation Age of Onset  . Diabetes Mother        Bilateral leg amputation  . Heart disease Mother        Before age 34  . Hypertension Mother   . Cancer Father        Prostate and Bone  . Diabetes Father   . Cancer Brother        Prostate  . Heart disease Other        family h/o premature cardiovascular disease  . Cancer Sister        Colon   . Hypertension Sister    Past Surgical History:  Procedure Laterality Date  . BIV ICD GENERATOR CHANGEOUT N/A 03/18/2017   Procedure: BiV ICD Generator Changeout;  Surgeon: Thompson Grayer, MD;  Location: [place] CV LAB;  Service: Cardiovascular;  Laterality: N/A;  . CHOLECYSTECTOMY    . ICD placement  May 06, 2011   Medtronic Protecta XT CRT-D  . LEFT HEART CATHETERIZATION WITH CORONARY ANGIOGRAM N/A 04/17/2013   Procedure: LEFT HEART CATHETERIZATION WITH CORONARY ANGIOGRAM;  Surgeon: Tyler Headings, MD;  Location[facility] CATH LAB;  Service: Cardiovascular;  Laterality: N/A;  . LUNG SURGERY     23 - 24 yrs. old  . TOTAL KNEE ARTHROPLASTY    . VASECTOMY     Social History   Social History Narrative  . No narrative on file     Objective: Vital Signs: There were no vitals taken for this visit.   Physical Exam   Musculoskeletal Exam: ***  CDAI Exam: No CDAI exam completed.    Investigation: Findings:  01/12/2017 RF 17.3 elevated, ESR 22, LDL 135, T4 normal, CBC normal, CMP creatinine 1.78, GFR 36, PSA normal, hemoglobin A1c 7.5%, TSH elevated at 7.130  03/02/2017 Uric acid elevated 10.3 ,ANA negative, CK normal CCP normal, G6PD normal, Hepatitis B and C screening non reactive/ Negative, SPEP normal,  IgG  Normal, TB gold negative, and CK normal.         Imaging: Xr Foot 2 Views Left  Result Date: 03/02/2017 DIP PIP narrowing was noted. No erosive changes were noted. There were some cystic changes  and tarsal bones. A small calcaneal spur was noted. These findings were consistent with osteoarthritis of the foot.  Xr Hand 2 View Left  Result Date: 03/02/2017 Left second and third MCP joint narrowing all PIP joints narrowing intercarpal joint space narrowing radiocarpal joint space narrowing or erosion noted in the carpal bone. Calcification of the blood vessels was also noted. Impression: These findings are consistent with rheumatoid arthritis and osteoarthritis overlap  Xr Hand 2 View Right  Result Date: 03/02/2017 Right second and third MCP joint narrowing all PIP/DIP joint narrowing all intercarpal joint space and radiocarpal joint space narrowing with some erosive changes noted in the carpal bones. Calcification of the blood vessels was also noted as well. Impression these findings are consistent with rheumatoid arthritis and  osteoarthritis overlap   Speciality Comments: No specialty comments available.    Procedures:  No procedures performed Allergies: Codeine   Assessment / Plan:     Visit Diagnoses: Rheumatoid factor positive  High risk medication use  Primary osteoarthritis of both hands  H/O total knee replacement, bilateral  Primary osteoarthritis of both feet  Former smoker  Other fatigue  History of diabetes mellitus  History of hypothyroidism  History of renal insufficiency  History of heart failure  History of TIA (transient ischemic attack)  History of coronary artery disease  History of atrial fibrillation - On warfarin    Orders: No orders of the defined types were placed in this encounter.  No orders of the defined types were placed in this encounter.   Face-to-face time spent with patient was *** minutes. 50% of time was spent in counseling and coordination of care.  Follow-Up Instructions: No Follow-up on file.   Bo Merino, MD  Note - This record has been created using Editor, commissioning.  Chart creation errors have been  sought, but may not always  have been located. Such creation errors do not reflect on  the standard of medical care.

## 2017-03-25 DIAGNOSIS — E119 Type 2 diabetes mellitus without complications: Secondary | ICD-10-CM | POA: Diagnosis not present

## 2017-03-25 DIAGNOSIS — I4891 Unspecified atrial fibrillation: Secondary | ICD-10-CM | POA: Diagnosis not present

## 2017-03-27 DIAGNOSIS — M0579 Rheumatoid arthritis with rheumatoid factor of multiple sites without organ or systems involvement: Secondary | ICD-10-CM | POA: Insufficient documentation

## 2017-03-28 ENCOUNTER — Other Ambulatory Visit: Payer: Self-pay | Admitting: Cardiology

## 2017-03-30 ENCOUNTER — Ambulatory Visit (INDEPENDENT_AMBULATORY_CARE_PROVIDER_SITE_OTHER): Payer: PPO | Admitting: Rheumatology

## 2017-03-30 ENCOUNTER — Telehealth: Payer: Self-pay | Admitting: Radiology

## 2017-03-30 ENCOUNTER — Encounter: Payer: Self-pay | Admitting: Rheumatology

## 2017-03-30 VITALS — BP 120/62 | HR 58 | Resp 14 | Wt 256.0 lb

## 2017-03-30 DIAGNOSIS — L97511 Non-pressure chronic ulcer of other part of right foot limited to breakdown of skin: Secondary | ICD-10-CM | POA: Diagnosis not present

## 2017-03-30 DIAGNOSIS — Z8679 Personal history of other diseases of the circulatory system: Secondary | ICD-10-CM

## 2017-03-30 DIAGNOSIS — G894 Chronic pain syndrome: Secondary | ICD-10-CM | POA: Diagnosis not present

## 2017-03-30 DIAGNOSIS — M19072 Primary osteoarthritis, left ankle and foot: Secondary | ICD-10-CM

## 2017-03-30 DIAGNOSIS — Z87891 Personal history of nicotine dependence: Secondary | ICD-10-CM | POA: Diagnosis not present

## 2017-03-30 DIAGNOSIS — Z8639 Personal history of other endocrine, nutritional and metabolic disease: Secondary | ICD-10-CM | POA: Diagnosis not present

## 2017-03-30 DIAGNOSIS — M47817 Spondylosis without myelopathy or radiculopathy, lumbosacral region: Secondary | ICD-10-CM | POA: Diagnosis not present

## 2017-03-30 DIAGNOSIS — M19041 Primary osteoarthritis, right hand: Secondary | ICD-10-CM | POA: Diagnosis not present

## 2017-03-30 DIAGNOSIS — M19071 Primary osteoarthritis, right ankle and foot: Secondary | ICD-10-CM | POA: Diagnosis not present

## 2017-03-30 DIAGNOSIS — Z96653 Presence of artificial knee joint, bilateral: Secondary | ICD-10-CM | POA: Diagnosis not present

## 2017-03-30 DIAGNOSIS — R5383 Other fatigue: Secondary | ICD-10-CM

## 2017-03-30 DIAGNOSIS — M0579 Rheumatoid arthritis with rheumatoid factor of multiple sites without organ or systems involvement: Secondary | ICD-10-CM | POA: Diagnosis not present

## 2017-03-30 DIAGNOSIS — M791 Myalgia: Secondary | ICD-10-CM | POA: Diagnosis not present

## 2017-03-30 DIAGNOSIS — M5137 Other intervertebral disc degeneration, lumbosacral region: Secondary | ICD-10-CM | POA: Diagnosis not present

## 2017-03-30 DIAGNOSIS — M5416 Radiculopathy, lumbar region: Secondary | ICD-10-CM | POA: Diagnosis not present

## 2017-03-30 DIAGNOSIS — M47816 Spondylosis without myelopathy or radiculopathy, lumbar region: Secondary | ICD-10-CM | POA: Diagnosis not present

## 2017-03-30 DIAGNOSIS — Z8614 Personal history of Methicillin resistant Staphylococcus aureus infection: Secondary | ICD-10-CM | POA: Diagnosis not present

## 2017-03-30 DIAGNOSIS — Z79899 Other long term (current) drug therapy: Secondary | ICD-10-CM | POA: Diagnosis not present

## 2017-03-30 DIAGNOSIS — M48061 Spinal stenosis, lumbar region without neurogenic claudication: Secondary | ICD-10-CM | POA: Diagnosis not present

## 2017-03-30 DIAGNOSIS — Z95810 Presence of automatic (implantable) cardiac defibrillator: Secondary | ICD-10-CM

## 2017-03-30 DIAGNOSIS — Z87448 Personal history of other diseases of urinary system: Secondary | ICD-10-CM

## 2017-03-30 DIAGNOSIS — M533 Sacrococcygeal disorders, not elsewhere classified: Secondary | ICD-10-CM | POA: Diagnosis not present

## 2017-03-30 DIAGNOSIS — M5136 Other intervertebral disc degeneration, lumbar region: Secondary | ICD-10-CM | POA: Diagnosis not present

## 2017-03-30 DIAGNOSIS — M461 Sacroiliitis, not elsewhere classified: Secondary | ICD-10-CM | POA: Diagnosis not present

## 2017-03-30 DIAGNOSIS — M19042 Primary osteoarthritis, left hand: Secondary | ICD-10-CM

## 2017-03-30 DIAGNOSIS — Z8673 Personal history of transient ischemic attack (TIA), and cerebral infarction without residual deficits: Secondary | ICD-10-CM

## 2017-03-30 DIAGNOSIS — Z79891 Long term (current) use of opiate analgesic: Secondary | ICD-10-CM | POA: Diagnosis not present

## 2017-03-30 MED ORDER — SULFASALAZINE 500 MG PO TBEC: 500.0000 mg | DELAYED_RELEASE_TABLET | Freq: Two times a day (BID) | ORAL | 2 refills | Status: DC

## 2017-03-30 NOTE — Progress Notes (Deleted)
Office Visit Note  Patient: [name]             Date of Birth: Mar 30, 1941           MRN: [id number]             PCP: Monico Blitz, MD Referring: Monico Blitz, MD Visit Date: 03/30/2017 Occupation: _0 @    Subjective:  Medication Management   History of Present Illness: [name] is a 76 y.o. male with sero positive rheumatoid arthritis erosive disease. He states she's been having increased pain and discomfort in his hands. He had to take offerings from his hands. He's been having swelling in his bilateral wrist joints. No other joints are painful. He has decreased grip strength bilaterally.      No Rheumatology ROS completed.   PMFS History:  Patient Active Problem List   Diagnosis Date Noted  . Rheumatoid arthritis involving multiple sites with positive rheumatoid factor ([place]) 03/27/2017  . Pain in both hands 03/24/2017  . H/O total knee replacement, bilateral 03/24/2017  . Pain in both feet 03/24/2017  . High risk medication use 03/24/2017  . Other fatigue 03/24/2017  . History of hypothyroidism 03/24/2017  . Former smoker 03/24/2017  . PVD (peripheral vascular disease) with claudication ([place]) 07/11/2014  . TIA (transient ischemic attack) 05/30/2013  . Long term (current) use of anticoagulants 05/30/2013  . CKD (chronic kidney disease) stage 3, GFR 30-59 ml/min 05/29/2013  . Paroxysmal atrial fibrillation (HCC)   . Cardiomyopathy, ischemic   . Biventricular ICD (implantable cardioverter-defibrillator) in place 08/17/2012  . DM 05/15/2010  . Hypertensive cardiovascular disease 04/25/2010  . HYPERLIPIDEMIA-MIXED 08/28/2009  . CAD, NATIVE VESSEL 08/28/2009  . LBBB 08/28/2009  . SYSTOLIC HEART FAILURE, CHRONIC 08/28/2009    Past Medical History:  Diagnosis Date  . Anemia   . Anemia-chronic    a. Pt reports h/o anemia - was offered IV iron in past by nephrologist but declined and has taken PO instead.  . Aneurysm, cerebral   . BPH (benign prostatic  hypertrophy)   . Cardiomyopathy, ischemic    LVEF 25-35%.  . Chronic kidney disease, stage III (moderate)    Followed by Dr. Hinda Lenis.  . Coronary atherosclerosis of native coronary artery    a. DES to RCA 03/2009. b. s/p PTCA to RCA for ISR, 05/2010. c. 03/2013 NSTEMI 2/2 severe prox RCA s/p PTCA/DES, med rx for residual dz.  . Essential hypertension, benign   . Hyperlipidemia   . LBBB (left bundle branch block)    s/p BiV ICD Implanted by Dr Caryl Comes  . Morbid obesity ([place])   . MRSA (methicillin resistant Staphylococcus aureus)   . Myocardial infarction ([place])   . Paroxysmal atrial fibrillation ([place])    a. Coumadin discontinue in 2010 when the patient required ASA/Plavix for stenting. b. Coumadin restarted 05/2013, Plavix continued, ASA stopped.   . Pulmonary nodule    CXR, 03/2013, [facility] - not described on any subsequent chest x-rays however, could be a vascular structure  . TIA (transient ischemic attack)    Multiple TIAs  . Type 2 diabetes mellitus (HCC)     Family History  Problem Relation Age of Onset  . Diabetes Mother        Bilateral leg amputation  . Heart disease Mother        Before age 17  . Hypertension Mother   . Cancer Father        Prostate and Bone  . Diabetes Father   .  Cancer Brother        Prostate  . Heart disease Other        family h/o premature cardiovascular disease  . Cancer Sister        Colon   . Hypertension Sister    Past Surgical History:  Procedure Laterality Date  . BIV ICD GENERATOR CHANGEOUT N/A 03/18/2017   Procedure: BiV ICD Generator Changeout;  Surgeon: Thompson Grayer, MD;  Location: [facility];  Service: Cardiovascular;  Laterality: N/A;  . CHOLECYSTECTOMY    . ICD placement  May 06, 2011   Medtronic Protecta XT CRT-D  . LEFT HEART CATHETERIZATION WITH CORONARY ANGIOGRAM N/A 04/17/2013   Procedure: LEFT HEART CATHETERIZATION WITH CORONARY ANGIOGRAM;  Surgeon: Thayer Headings, MD;  Location: [facility] C[facility];  Service: Cardiovascular;   Laterality: N/A;  . LUNG SURGERY     24 - 25 yrs. old  . TOTAL KNEE ARTHROPLASTY    . VASECTOMY     Social History   Social History Narrative  . No narrative on file     Objective: Vital Signs: BP 120/62   Pulse (!) 58   Resp 14   Wt 256 lb (116.1 kg)   BMI 38.92 kg/m    Physical Exam   Musculoskeletal Exam: ***  CDAI Exam: No CDAI exam completed.    Investigation: No additional findings.   Imaging: Xr Foot 2 Views Left  Result Date: 03/02/2017 DIP PIP narrowing was noted. No erosive changes were noted. There were some cystic changes and tarsal bones. A small calcaneal spur was noted. These findings were consistent with osteoarthritis of the foot.  Xr Hand 2 View Left  Result Date: 03/02/2017 Left second and third MCP joint narrowing all PIP joints narrowing intercarpal joint space narrowing radiocarpal joint space narrowing or erosion noted in the carpal bone. Calcification of the blood vessels was also noted. Impression: These findings are consistent with rheumatoid arthritis and osteoarthritis overlap  Xr Hand 2 View Right  Result Date: 03/02/2017 Right second and third MCP joint narrowing all PIP/DIP joint narrowing all intercarpal joint space and radiocarpal joint space narrowing with some erosive changes noted in the carpal bones. Calcification of the blood vessels was also noted as well. Impression these findings are consistent with rheumatoid arthritis and  osteoarthritis overlap   Speciality Comments: No specialty comments available.    Procedures:  No procedures performed Allergies: Codeine   Assessment / Plan:     Visit Diagnoses: Rheumatoid arthritis involving multiple sites with positive rheumatoid factor (HCC) - Positive RF, positive synovitis, erosive disease  High risk medication use - Plan: CMP14+EGFR, CBC with Differential/Platelet, CMP14+EGFR, CBC with Differential/Platelet  Foot ulcer - Right great toe on plantar surface  History of  MRSA infection - Recurrent MRSA infection per patient  Primary osteoarthritis of both hands  H/O total knee replacement, bilateral  Primary osteoarthritis of both feet  Former smoker  Other fatigue  History of diabetes mellitus  History of hypothyroidism  History of renal insufficiency  History of heart failure  History of TIA (transient ischemic attack)  History of coronary artery disease  History of atrial fibrillation - On warfarin  Biventricular ICD (implantable cardioverter-defibrillator) in place    Orders: Orders Placed This Encounter  Procedures  . CMP14+EGFR  . CBC with Differential/Platelet   No orders of the defined types were placed in this encounter.   Face-to-face time spent with patient was *** minutes. 50% of time was spent in counseling and  coordination of care.  Follow-Up Instructions: Return in about 3 months (around 06/30/2017) for Rheumatoid arthritis.    , RT  Note - This record has been created using [employer].  Chart creation errors have been sought, but may not always  have been located. Such creation errors do not reflect on  the standard of medical care.

## 2017-03-30 NOTE — Telephone Encounter (Signed)
Did not call him, was going to call about change of med, but we caught him in the parking lot.

## 2017-03-30 NOTE — Patient Instructions (Addendum)
Standing Labs We placed an order today for your standing lab work.    Please come back and get your standing labs in 2 weeks and then every 2 months.   We have open lab Monday through Friday from 8:30-11:30 AM and 1:30-4 PM at the office of Dr. Arbutus Ped, PA.   The office is located at [address], [address], [place], [place] [address] No appointment is necessary.   Labs are drawn by [facility].  You may receive a bill from [facility] for your lab work.     Hydroxychloroquine tablets What is this medicine? HYDROXYCHLOROQUINE (hye drox ee KLOR oh kwin) is used to treat rheumatoid arthritis and systemic lupus erythematosus. It is also used to treat malaria. This medicine may be used for other purposes; ask your health care provider or pharmacist if you have questions. COMMON BRAND NAME(S): Plaquenil, Quineprox What should I tell my health care provider before I take this medicine? They need to know if you have any of these conditions: -diabetes -eye disease, vision problems -G6PD deficiency -history of blood diseases -history of irregular heartbeat -if you often drink alcohol -kidney disease -liver disease -porphyria -psoriasis -seizures -an unusual or allergic reaction to chloroquine, hydroxychloroquine, other medicines, foods, dyes, or preservatives -pregnant or trying to get pregnant -breast-feeding How should I use this medicine? Take this medicine by mouth with a glass of water. Follow the directions on the prescription label. Avoid taking antacids within 4 hours of taking this medicine. It is best to separate these medicines by at least 4 hours. Do not cut, crush or chew this medicine. You can take it with or without food. If it upsets your stomach, take it with food. Take your medicine at regular intervals. Do not take your medicine more often than directed. Take all of your medicine as directed even if you think you are better. Do not skip doses or stop your  medicine early. Talk to your pediatrician regarding the use of this medicine in children. While this drug may be prescribed for selected conditions, precautions do apply. Overdosage: If you think you have taken too much of this medicine contact a poison control center or emergency room at once. NOTE: This medicine is only for you. Do not share this medicine with others. What if I miss a dose? If you miss a dose, take it as soon as you can. If it is almost time for your next dose, take only that dose. Do not take double or extra doses. What may interact with this medicine? Do not take this medicine with any of the following medications: -cisapride -dofetilide -dronedarone -live virus vaccines -penicillamine -pimozide -thioridazine -ziprasidone This medicine may also interact with the following medications: -ampicillin -antacids -cimetidine -cyclosporine -digoxin -medicines for diabetes, like insulin, glipizide, glyburide -medicines for seizures like carbamazepine, phenobarbital, phenytoin -mefloquine -methotrexate -other medicines that prolong the QT interval (cause an abnormal heart rhythm) -praziquantel This list may not describe all possible interactions. Give your health care provider a list of all the medicines, herbs, non-prescription drugs, or dietary supplements you use. Also tell them if you smoke, drink alcohol, or use illegal drugs. Some items may interact with your medicine. What should I watch for while using this medicine? Tell your doctor or healthcare professional if your symptoms do not start to get better or if they get worse. Avoid taking antacids within 4 hours of taking this medicine. It is best to separate these medicines by at least 4 hours. Tell your doctor  or health care professional right away if you have any change in your eyesight. Your vision and blood may be tested before and during use of this medicine. This medicine can make you more sensitive to the  sun. Keep out of the sun. If you cannot avoid being in the sun, wear protective clothing and use sunscreen. Do not use sun lamps or tanning beds/booths. What side effects may I notice from receiving this medicine? Side effects that you should report to your doctor or health care professional as soon as possible: -allergic reactions like skin rash, itching or hives, swelling of the face, lips, or tongue -changes in vision -decreased hearing or ringing of the ears -redness, blistering, peeling or loosening of the skin, including inside the mouth -seizures -sensitivity to light -signs and symptoms of a dangerous change in heartbeat or heart rhythm like chest pain; dizziness; fast or irregular heartbeat; palpitations; feeling faint or lightheaded, falls; breathing problems -signs and symptoms of liver injury like dark yellow or brown urine; general ill feeling or flu-like symptoms; light-colored stools; loss of appetite; nausea; right upper belly pain; unusually weak or tired; yellowing of the eyes or skin -signs and symptoms of low blood sugar such as feeling anxious; confusion; dizziness; increased hunger; unusually weak or tired; sweating; shakiness; cold; irritable; headache; blurred vision; fast heartbeat; loss of consciousness -uncontrollable head, mouth, neck, arm, or leg movements Side effects that usually do not require medical attention (report to your doctor or health care professional if they continue or are bothersome): -anxious -diarrhea -dizziness -hair loss -headache -irritable -loss of appetite -nausea, vomiting -stomach pain This list may not describe all possible side effects. Call your doctor for medical advice about side effects. You may report side effects to FDA at 1-800-FDA-1088. Where should I keep my medicine? Keep out of the reach of children. In children, this medicine can cause overdose with small doses. Store at room temperature between 15 and 30 degrees C (59 and  86 degrees F). Protect from moisture and light. Throw away any unused medicine after the expiration date. NOTE: This sheet is a summary. It may not cover all possible information. If you have questions about this medicine, talk to your doctor, pharmacist, or health care provider.  2018 Elsevier/Gold Standard (2016-06-17 14:16:15)    Sulfasalazine delayed release tablets What is this medicine? SULFASALAZINE (sul fa SAL a zeen) is for ulcerative colitis and certain types of rheumatoid arthritis. This medicine may be used for other purposes; ask your health care provider or pharmacist if you have questions. COMMON BRAND NAME(S): Azulfidine En-Tabs, Sulfazine EC What should I tell my health care provider before I take this medicine? They need to know if you have any of these conditions: -asthma -blood disorders or anemia -glucose-6-phosphate dehydrogenase (G6PD) deficiency -intestinal obstruction -kidney disease -liver disease -porphyria -urinary tract obstruction -an unusual reaction to sulfasalazine, sulfa drugs, salicylates, other medicines, foods, dyes, or preservatives -pregnant or trying to get pregnant -breast-feeding How should I use this medicine? Take this medicine by mouth with a full glass of water. Follow the directions on the prescription label. If the medicine upsets your stomach, take it with food or milk. Do not crush or chew the tablets. Swallow the tablets whole. Take your medicine at regular intervals. Do not take your medicine more often than directed. Do not stop taking except on your doctor's advice. Talk to your pediatrician regarding the use of this medicine in children. Special care may be needed. While this drug may  be prescribed for children as young as 6 years for selected conditions, precautions do apply. Patients over 32 years old may have a stronger reaction and need a smaller dose. Overdosage: If you think you have taken too much of this medicine contact a  poison control center or emergency room at once. NOTE: This medicine is only for you. Do not share this medicine with others. What if I miss a dose? If you miss a dose, take it as soon as you can. If it is almost time for your next dose, take only that dose. Do not take double or extra doses. What may interact with this medicine? -digoxin -folic acid This list may not describe all possible interactions. Give your health care provider a list of all the medicines, herbs, non-prescription drugs, or dietary supplements you use. Also tell them if you smoke, drink alcohol, or use illegal drugs. Some items may interact with your medicine. What should I watch for while using this medicine? Visit your doctor or health care professional for regular checks on your progress. You will need frequent blood and urine checks. This medicine can make you more sensitive to the sun. Keep out of the sun. If you cannot avoid being in the sun, wear protective clothing and use sunscreen. Do not use sun lamps or tanning beds/booths. Drink plenty of water while taking this medicine. Tell your doctor if you see the tablet in your stools. Your body may not be absorbing the medicine. What side effects may I notice from receiving this medicine? Side effects that you should report to your doctor or health care professional as soon as possible: -allergic reactions like skin rash, itching or hives, swelling of the face, lips, or tongue -fever, chills, or any other sign of infection -painful, difficult, or reduced urination -redness, blistering, peeling or loosening of the skin, including inside the mouth -severe stomach pain -unusual bleeding or bruising -unusually weak or tired -yellowing of the skin or eyes Side effects that usually do not require medical attention (report to your doctor or health care professional if they continue or are bothersome): -headache -loss of appetite -nausea, vomiting -orange color to the  urine -reduced sperm count This list may not describe all possible side effects. Call your doctor for medical advice about side effects. You may report side effects to FDA at 1-800-FDA-1088. Where should I keep my medicine? Keep out of the reach of children. Store at room temperature between 15 and 30 degrees C (59 and 86 degrees F). Keep container tightly closed. Throw away any unused medicine after the expiration date. NOTE: This sheet is a summary. It may not cover all possible information. If you have questions about this medicine, talk to your doctor, pharmacist, or health care provider.  2018 Elsevier/Gold Standard (2008-07-06 13:02:26)

## 2017-03-30 NOTE — Progress Notes (Signed)
Pharmacy Note  Subjective:  Patient presents today to the [facility] to see Dr. Corliss Skains.  I was asked to see patient for counseling on hydroxychloroquine and sulfasalazine.    Objective: 03/12/17 - CBC normal, CMP Cr 1.94, BUN 50, GFR 32   G6PD: 19.9 (03/02/17)  Assessment/Plan:   Patient was counseled on the purpose, proper use, and adverse effects of hydroxychloroquine including nausea/diarrhea, skin rash, headaches, and sun sensitivity.  Discussed importance of annual eye exams while on hydroxychloroquine to monitor to ocular toxicity and discussed importance of frequent laboratory monitoring.  Provided patient with eye exam form for baseline ophthalmologic exam and standing lab instructions.  Provided patient with educational materials on hydroxychloroquine and answered all questions.  Patient consented to hydroxychloroquine.  Will upload consent in the media tab.  Discussed starting patient on hydroxychloroquine 200 mg daily.  Patient was concerned about hydroxychloroquine use with his arrhythmia.  Patient was given a clearance form to take to his cardiologist.  Will not plan to start hydroxychloroquine unless he is cleared by his cardiologist.    Decision was made to start patient on sulfasalazine instead.  Patient was counseled on the purpose, proper use, and adverse effects of sulfasalazine including risk of infection and chance of nausea, headache, and sun sensitivity.  Also discussed risk of skin rash and advised patient to stop the medication and let us know if he develops a rash. Also discussed for the potential of discoloration of the urine, sweat, or tears.  Reviewed the importance of frequent labs to monitor liver, kidneys, and blood counts; and patient voiced understanding.  Provided patient with educational materials on sulfasalazine and answered all questions.  Patient consented to sulfasalazine.  Will plan to start patient on sulfasalazine 500 mg BID.  Patient was  advised to contact his Coumadin clinic to alert them of this new medication.  Patient denies any further questions or concerns at this time.    Lilla Shook, Pharm.D., BCPS Clinical Pharmacist Pager: [phone] Phone: [phone] 03/30/2017 12:46 PM

## 2017-03-30 NOTE — Progress Notes (Signed)
Office Visit Note  Patient: [name]             Date of Birth: December 20, 1940           MRN: [id number]             PCP: Monico Blitz, MD Referring: Monico Blitz, MD Visit Date: 03/30/2017 Occupation: _0 @    Subjective:  Pain and swelling in bilateral hands   History of Present Illness: [name] is a 76 y.o. male with sero positive rheumatoid arthritis erosive disease. He states she's been having increased pain and discomfort in his hands. He had to take his rings off his hands. He's been having swelling in his bilateral wrist joints. No other joints are painful. He has decreased grip strength bilaterally.  Activities of Daily Living:  Patient reports morning stiffness for 5 minutes.   Patient Denies nocturnal pain.  Difficulty dressing/grooming: Denies Difficulty climbing stairs: Denies Difficulty getting out of chair: Denies Difficulty using hands for taps, buttons, cutlery, and/or writing: Reports   Review of Systems  Constitutional: Negative for fatigue, night sweats and weakness ( ).  HENT: Positive for mouth dryness. Negative for mouth sores and nose dryness.   Eyes: Negative for redness and dryness.  Respiratory: Negative for shortness of breath and difficulty breathing.   Cardiovascular: Negative for chest pain, palpitations, hypertension, irregular heartbeat and swelling in legs/feet.  Gastrointestinal: Negative for constipation and diarrhea.  Endocrine: Negative for increased urination.  Musculoskeletal: Positive for arthralgias, joint pain, joint swelling and morning stiffness. Negative for myalgias, muscle weakness, muscle tenderness and myalgias.  Skin: Negative for color change, rash, hair loss, nodules/bumps, skin tightness, ulcers and sensitivity to sunlight.  Allergic/Immunologic: Negative for susceptible to infections.  Neurological: Negative for dizziness, fainting, memory loss and night sweats.  Hematological: Negative for swollen glands.    Psychiatric/Behavioral: Positive for sleep disturbance. Negative for depressed mood. The patient is not nervous/anxious.     PMFS History:  Patient Active Problem List   Diagnosis Date Noted  . Rheumatoid arthritis involving multiple sites with positive rheumatoid factor ([place]) 03/27/2017  . Pain in both hands 03/24/2017  . H/O total knee replacement, bilateral 03/24/2017  . Pain in both feet 03/24/2017  . High risk medication use 03/24/2017  . Other fatigue 03/24/2017  . History of hypothyroidism 03/24/2017  . Former smoker 03/24/2017  . PVD (peripheral vascular disease) with claudication ([place]) 07/11/2014  . TIA (transient ischemic attack) 05/30/2013  . Long term (current) use of anticoagulants 05/30/2013  . CKD (chronic kidney disease) stage 3, GFR 30-59 ml/min 05/29/2013  . Paroxysmal atrial fibrillation (HCC)   . Cardiomyopathy, ischemic   . Biventricular ICD (implantable cardioverter-defibrillator) in place 08/17/2012  . DM 05/15/2010  . Hypertensive cardiovascular disease 04/25/2010  . HYPERLIPIDEMIA-MIXED 08/28/2009  . CAD, NATIVE VESSEL 08/28/2009  . LBBB 08/28/2009  . SYSTOLIC HEART FAILURE, CHRONIC 08/28/2009    Past Medical History:  Diagnosis Date  . Anemia   . Anemia-chronic    a. Pt reports h/o anemia - was offered IV iron in past by nephrologist but declined and has taken PO instead.  . Aneurysm, cerebral   . BPH (benign prostatic hypertrophy)   . Cardiomyopathy, ischemic    LVEF 25-35%.  . Chronic kidney disease, stage III (moderate)    Followed by Dr. Hinda Lenis.  . Coronary atherosclerosis of native coronary artery    a. DES to RCA 03/2009. b. s/p PTCA to RCA for ISR, 05/2010. c. 03/2013 NSTEMI 2/2  severe prox RCA s/p PTCA/DES, med rx for residual dz.  . Essential hypertension, benign   . Hyperlipidemia   . LBBB (left bundle branch block)    s/p BiV ICD Implanted by Dr Caryl Comes  . Morbid obesity ([place])   . MRSA (methicillin resistant Staphylococcus aureus)   .  Myocardial infarction ([place])   . Paroxysmal atrial fibrillation ([place])    a. Coumadin discontinue in 2010 when the patient required ASA/Plavix for stenting. b. Coumadin restarted 05/2013, Plavix continued, ASA stopped.   . Pulmonary nodule    CXR, 03/2013, [facility] - not described on any subsequent chest x-rays however, could be a vascular structure  . TIA (transient ischemic attack)    Multiple TIAs  . Type 2 diabetes mellitus (HCC)     Family History  Problem Relation Age of Onset  . Diabetes Mother        Bilateral leg amputation  . Heart disease Mother        Before age 72  . Hypertension Mother   . Cancer Father        Prostate and Bone  . Diabetes Father   . Cancer Brother        Prostate  . Heart disease Other        family h/o premature cardiovascular disease  . Cancer Sister        Colon   . Hypertension Sister    Past Surgical History:  Procedure Laterality Date  . BIV ICD GENERATOR CHANGEOUT N/A 03/18/2017   Procedure: BiV ICD Generator Changeout;  Surgeon: Thompson Grayer, MD;  Location: [facility];  Service: Cardiovascular;  Laterality: N/A;  . CHOLECYSTECTOMY    . ICD placement  May 06, 2011   Medtronic Protecta XT CRT-D  . LEFT HEART CATHETERIZATION WITH CORONARY ANGIOGRAM N/A 04/17/2013   Procedure: LEFT HEART CATHETERIZATION WITH CORONARY ANGIOGRAM;  Surgeon: Thayer Headings, MD;  Location: [facility] C[facility];  Service: Cardiovascular;  Laterality: N/A;  . LUNG SURGERY     24 - 25 yrs. old  . TOTAL KNEE ARTHROPLASTY    . VASECTOMY     Social History   Social History Narrative  . No narrative on file     Objective: Vital Signs: BP 120/62   Pulse (!) 58   Resp 14   Wt 256 lb (116.1 kg)   BMI 38.92 kg/m    Physical Exam  Constitutional: He is oriented to person, place, and time. He appears well-developed and well-nourished.  HENT:  Head: Normocephalic and atraumatic.  Eyes: Conjunctivae and EOM are normal. Pupils are equal, round, and reactive to light.    Neck: Normal range of motion. Neck supple.  Cardiovascular: Normal rate, regular rhythm and normal heart sounds.   Pulmonary/Chest: Effort normal and breath sounds normal.  Abdominal: Soft. Bowel sounds are normal.  Difficulty palpating due to body habitus  Neurological: He is alert and oriented to person, place, and time.  Skin: Skin is warm and dry. Capillary refill takes less than 2 seconds.  Psychiatric: He has a normal mood and affect. His behavior is normal.  Nursing note and vitals reviewed.    Musculoskeletal Exam: C-spine and thoracic spine good range of motion. Shoulder joints elbow joints good range of motion. He had limited range of motion of bilateral wrists joint for synovitis over bilateral wrist joints. He has some synovitis in his MCPs as described below. Hip joints knee joints ankles are good range of motion. With no synovitis.  CDAI  Exam: CDAI Homunculus Exam:   Tenderness:  RUE: wrist LUE: wrist Right hand: 2nd MCP, 3rd MCP and 4th MCP Left hand: 2nd MCP  Swelling:  RUE: wrist LUE: wrist Right hand: 2nd MCP, 3rd MCP and 4th MCP  Joint Counts:  CDAI Tender Joint count: 6 CDAI Swollen Joint count: 5  Global Assessments:  Patient Global Assessment: 5 Provider Global Assessment: 5  CDAI Calculated Score: 21    Investigation: Findings:  01/12/2017 RF 17.3 elevated, ESR 22, LDL 135, T4 normal, CBC normal, CMP creatinine 1.78, GFR 36, PSA normal, hemoglobin A1c 7.5%, TSH elevated at 7.130  03/02/2017 Uric acid elevated 10.3 ,ANA negative, CK normal CCP normal, G6PD normal, Hepatitis B and C screening non reactive/ Negative, SPEP normal,  IgG  Normal, TB gold negative, and CK normal.     Imaging: Xr Foot 2 Views Left  Result Date: 03/02/2017 DIP PIP narrowing was noted. No erosive changes were noted. There were some cystic changes and tarsal bones. A small calcaneal spur was noted. These findings were consistent with osteoarthritis of the foot.  Xr  Hand 2 View Left  Result Date: 03/02/2017 Left second and third MCP joint narrowing all PIP joints narrowing intercarpal joint space narrowing radiocarpal joint space narrowing or erosion noted in the carpal bone. Calcification of the blood vessels was also noted. Impression: These findings are consistent with rheumatoid arthritis and osteoarthritis overlap  Xr Hand 2 View Right  Result Date: 03/02/2017 Right second and third MCP joint narrowing all PIP/DIP joint narrowing all intercarpal joint space and radiocarpal joint space narrowing with some erosive changes noted in the carpal bones. Calcification of the blood vessels was also noted as well. Impression these findings are consistent with rheumatoid arthritis and  osteoarthritis overlap   Speciality Comments: No specialty comments available.    Procedures:  No procedures performed Allergies: Codeine   Assessment / Plan:     Visit Diagnoses: Rheumatoid arthritis involving multiple sites with positive rheumatoid factor (HCC) - Positive RF, positive synovitis, erosive disease.  He continues to have pain and swelling in his bilateral wrist joints in his hands. We had detailed discussion regarding different treatment options and their side effects. Counseling regarding rheumatoid arthritis was provided. He has history of recurrent MRSA and would be good to avoid immunosuppressive agents with high potency. We discussed indications side effects of Plaquenil he was in agreement and will proceed with the medication. I plan to start him on Plaquenil 200 mg by mouth once a day if he gets clearance from his cardiologist. Plaquenil can mean associated with increased risk of arrhythmias. He will need eye exam and also will need pneumococcal vaccine. In the meantime we can start him on low-dose sulfasalazine as he has low GFR. Indications side effects contraindications were discussed and informed consent was taken. He was given sulfasalazine 500 mg 1 tablet  by mouth twice a day. He'll get labs in 2 weeks weeks and then every 2 months to monitor for drug toxicity.  High risk medication use - Plan: CMP14+EGFR, CBC with Differential/Platelet, CMP14+EGFR, CBC with Differential/Platelet  Foot ulcer - Right great toe on plantar surface has healed  History of MRSA infection - Recurrent MRSA infection per patient. We'll have to avoid aggressive immunosuppressive therapy  Primary osteoarthritis of both hands: Chronic pain  H/O total knee replacement, bilateral: Doing fairly well  Primary osteoarthritis of both feet: He's been wearing proper fitting shoes  Former smoker  Other fatigue  His other medical problems are  listed as follows:  History of diabetes mellitus  History of hypothyroidism  History of renal insufficiency  History of heart failure  History of TIA (transient ischemic attack)  History of coronary artery disease  History of atrial fibrillation - On warfarin  Biventricular ICD (implantable cardioverter-defibrillator) in place    Orders: Orders Placed This Encounter  Procedures  . CMP14+EGFR  . CBC with Differential/Platelet   Meds ordered this encounter  Medications  . sulfaSALAzine (AZULFIDINE EN-TABS) 500 MG EC tablet    Sig: Take 1 tablet (500 mg total) by mouth 2 (two) times daily.    Dispense:  60 tablet    Refill:  2    Face-to-face time spent with patient was 30 minutes. 50% of time was spent in counseling and coordination of care.  Follow-Up Instructions: Return in about 3 months (around 06/30/2017) for Rheumatoid arthritis.   Bo Merino, MD  Note - This record has been created using Editor, commissioning.  Chart creation errors have been sought, but may not always  have been located. Such creation errors do not reflect on  the standard of medical care.

## 2017-03-31 ENCOUNTER — Ambulatory Visit: Payer: Self-pay | Admitting: Rheumatology

## 2017-03-31 ENCOUNTER — Telehealth: Payer: Self-pay | Admitting: Pharmacist

## 2017-03-31 NOTE — Telephone Encounter (Signed)
I called patient to review lab monitoring plan with him.  I counseled patient to get labs two weeks after starting sulfasalazine then every 2 months.  Patient voiced understanding.   Lilla Shook, Pharm.D., BCPS, CPP Clinical Pharmacist Pager: [phone] Phone: [phone] 03/31/2017 10:35 AM

## 2017-04-01 ENCOUNTER — Ambulatory Visit (INDEPENDENT_AMBULATORY_CARE_PROVIDER_SITE_OTHER): Payer: PPO | Admitting: *Deleted

## 2017-04-01 ENCOUNTER — Ambulatory Visit: Payer: Self-pay | Admitting: Rheumatology

## 2017-04-01 DIAGNOSIS — I255 Ischemic cardiomyopathy: Secondary | ICD-10-CM

## 2017-04-01 LAB — CUP PACEART INCLINIC DEVICE CHECK
Battery Voltage: 3.14 V
Brady Statistic AP VS Percent: 0.59 %
Brady Statistic AS VP Percent: 79.46 %
Brady Statistic RA Percent Paced: 18.93 %
HIGH POWER IMPEDANCE MEASURED VALUE: 58 Ohm
HighPow Impedance: 44 Ohm
Implantable Lead Implant Date: 20120620
Implantable Lead Implant Date: 20120620
Implantable Lead Location: 753858
Implantable Lead Location: 753860
Implantable Lead Model: 5076
Implantable Pulse Generator Implant Date: 20180503
Lead Channel Impedance Value: 437 Ohm
Lead Channel Impedance Value: 646 Ohm
Lead Channel Pacing Threshold Pulse Width: 0.4 ms
Lead Channel Pacing Threshold Pulse Width: 0.4 ms
Lead Channel Pacing Threshold Pulse Width: 0.4 ms
Lead Channel Sensing Intrinsic Amplitude: 0.75 mV
Lead Channel Sensing Intrinsic Amplitude: 1 mV
Lead Channel Sensing Intrinsic Amplitude: 7.875 mV
Lead Channel Sensing Intrinsic Amplitude: 8 mV
Lead Channel Setting Pacing Amplitude: 3.5 V
Lead Channel Setting Pacing Pulse Width: 0.4 ms
MDC IDC LEAD IMPLANT DT: 20120620
MDC IDC LEAD LOCATION: 753859
MDC IDC MSMT BATTERY REMAINING LONGEVITY: 112 mo
MDC IDC MSMT LEADCHNL LV IMPEDANCE VALUE: 1045 Ohm
MDC IDC MSMT LEADCHNL LV IMPEDANCE VALUE: 513 Ohm
MDC IDC MSMT LEADCHNL LV PACING THRESHOLD AMPLITUDE: 0.625 V
MDC IDC MSMT LEADCHNL RA PACING THRESHOLD AMPLITUDE: 0.625 V
MDC IDC MSMT LEADCHNL RV IMPEDANCE VALUE: 304 Ohm
MDC IDC MSMT LEADCHNL RV IMPEDANCE VALUE: 380 Ohm
MDC IDC MSMT LEADCHNL RV PACING THRESHOLD AMPLITUDE: 0.625 V
MDC IDC SESS DTM: 20180517124032
MDC IDC SET LEADCHNL LV PACING AMPLITUDE: 2.25 V
MDC IDC SET LEADCHNL RV PACING AMPLITUDE: 3.5 V
MDC IDC SET LEADCHNL RV PACING PULSEWIDTH: 0.4 ms
MDC IDC SET LEADCHNL RV SENSING SENSITIVITY: 0.3 mV
MDC IDC STAT BRADY AP VP PERCENT: 18.52 %
MDC IDC STAT BRADY AS VS PERCENT: 1.43 %
MDC IDC STAT BRADY RV PERCENT PACED: 19.97 %

## 2017-04-01 NOTE — Progress Notes (Signed)
Wound check appointment s/p gen change. Steri-strips removed. Wound without redness or edema. Incision edges approximated, wound well healed. Normal device function. Thresholds, sensing, and impedances consistent with implant measurements. Device programmed at 3.5V for extra safety margin until 3 month visit. Histogram distribution appropriate for patient and level of activity. No mode switches or ventricular arrhythmias noted. Patient educated about wound care, arm mobility, lifting restrictions, shock plan. ROV with JA 7/27.

## 2017-04-05 ENCOUNTER — Encounter: Payer: Self-pay | Admitting: Pharmacist

## 2017-04-05 NOTE — Progress Notes (Signed)
Received response back from patient's cardiologist regarding use of hydroxychloroquine in this patient.  Their response was, "Patient has h/o PA, cardiomyopathy, and ICD in place.  He has been relatively stable from cardiac perspective.  Plaquenil could be used with caution."    Letter was reviewed by Dr. Corliss Skains and scanned into patient's chart.   Lilla Shook, Pharm.D., BCPS, CPP Clinical Pharmacist Pager: [phone] Phone: [phone] 04/05/2017 12:54 PM

## 2017-04-14 DIAGNOSIS — N183 Chronic kidney disease, stage 3 (moderate): Secondary | ICD-10-CM | POA: Diagnosis not present

## 2017-04-14 DIAGNOSIS — L97509 Non-pressure chronic ulcer of other part of unspecified foot with unspecified severity: Secondary | ICD-10-CM | POA: Diagnosis not present

## 2017-04-14 DIAGNOSIS — E785 Hyperlipidemia, unspecified: Secondary | ICD-10-CM | POA: Diagnosis not present

## 2017-04-14 DIAGNOSIS — I739 Peripheral vascular disease, unspecified: Secondary | ICD-10-CM | POA: Diagnosis not present

## 2017-04-14 DIAGNOSIS — N400 Enlarged prostate without lower urinary tract symptoms: Secondary | ICD-10-CM | POA: Diagnosis not present

## 2017-04-14 DIAGNOSIS — Z6838 Body mass index (BMI) 38.0-38.9, adult: Secondary | ICD-10-CM | POA: Diagnosis not present

## 2017-04-14 DIAGNOSIS — I4891 Unspecified atrial fibrillation: Secondary | ICD-10-CM | POA: Diagnosis not present

## 2017-04-14 DIAGNOSIS — I10 Essential (primary) hypertension: Secondary | ICD-10-CM | POA: Diagnosis not present

## 2017-04-14 DIAGNOSIS — Z299 Encounter for prophylactic measures, unspecified: Secondary | ICD-10-CM | POA: Diagnosis not present

## 2017-04-14 DIAGNOSIS — E1122 Type 2 diabetes mellitus with diabetic chronic kidney disease: Secondary | ICD-10-CM | POA: Diagnosis not present

## 2017-04-14 DIAGNOSIS — I428 Other cardiomyopathies: Secondary | ICD-10-CM | POA: Diagnosis not present

## 2017-04-14 DIAGNOSIS — E039 Hypothyroidism, unspecified: Secondary | ICD-10-CM | POA: Diagnosis not present

## 2017-04-27 DIAGNOSIS — Z79891 Long term (current) use of opiate analgesic: Secondary | ICD-10-CM | POA: Diagnosis not present

## 2017-04-27 DIAGNOSIS — G894 Chronic pain syndrome: Secondary | ICD-10-CM | POA: Diagnosis not present

## 2017-04-27 DIAGNOSIS — M5136 Other intervertebral disc degeneration, lumbar region: Secondary | ICD-10-CM | POA: Diagnosis not present

## 2017-04-27 DIAGNOSIS — M5137 Other intervertebral disc degeneration, lumbosacral region: Secondary | ICD-10-CM | POA: Diagnosis not present

## 2017-04-27 DIAGNOSIS — M47817 Spondylosis without myelopathy or radiculopathy, lumbosacral region: Secondary | ICD-10-CM | POA: Diagnosis not present

## 2017-04-27 DIAGNOSIS — M5416 Radiculopathy, lumbar region: Secondary | ICD-10-CM | POA: Diagnosis not present

## 2017-04-27 DIAGNOSIS — M461 Sacroiliitis, not elsewhere classified: Secondary | ICD-10-CM | POA: Diagnosis not present

## 2017-04-27 DIAGNOSIS — M533 Sacrococcygeal disorders, not elsewhere classified: Secondary | ICD-10-CM | POA: Diagnosis not present

## 2017-04-27 DIAGNOSIS — M48061 Spinal stenosis, lumbar region without neurogenic claudication: Secondary | ICD-10-CM | POA: Diagnosis not present

## 2017-04-27 DIAGNOSIS — M791 Myalgia: Secondary | ICD-10-CM | POA: Diagnosis not present

## 2017-04-27 DIAGNOSIS — M47816 Spondylosis without myelopathy or radiculopathy, lumbar region: Secondary | ICD-10-CM | POA: Diagnosis not present

## 2017-05-03 ENCOUNTER — Telehealth: Payer: Self-pay | Admitting: Rheumatology

## 2017-05-03 NOTE — Telephone Encounter (Signed)
Patient called this morning stating that Dr. Corliss Skains prescribed him a medication on the 15th. I did not see any medication for the 15th being prescribed to him.  He is wanting to know if Dr. Corliss Skains would prescribe him something that is not so invasive because he is a diabetic.  CB#[phone].  Thank you.

## 2017-05-03 NOTE — Telephone Encounter (Signed)
I called patient.  He reports he was reading over the information about hydroxychloroquine and decided he did not want to take the medication.  I informed him we were considering hydroxychloroquine in the future, but a prescription was not sent in for that medication.    I reviewed with him that we prescribed him sulfasalazine at his visit on 03/30/17.  Patient reviewed his prescription bottles and confirms he does not have hydroxychloroquine prescription, but he does have sulfasalazine prescription.  He reports he has not started taking the medication.  He found the handout I provided him on sulfasalazine and read over the information again.  We also had detailed discussion regarding goals of treatment of rheumatoid arthritis as well as purpose, proper use, and adverse effects of sulfasalazine.  Patient decided he does not want to initiate any medication for rheumatoid arthritis at this time.   Lilla Shook, Pharm.D., BCPS, CPP Clinical Pharmacist Pager: [phone] Phone: [phone] 05/03/2017 3:53 PM

## 2017-05-03 NOTE — Telephone Encounter (Signed)
We started him on SSz last visit. Please contact this Pt.

## 2017-05-03 NOTE — Telephone Encounter (Signed)
Patient states he was given a prescription PLQ. Patient is concerned about taking this medication because of his medical history. Patient states the fact that it may cause issues with his diabetes, kidney disease, and his heart issues. Patient states he does not want to take the PLQ but would be interested in something else if there is something "not as drastic". Please advise.

## 2017-05-13 DIAGNOSIS — I428 Other cardiomyopathies: Secondary | ICD-10-CM | POA: Diagnosis not present

## 2017-05-13 DIAGNOSIS — Z6838 Body mass index (BMI) 38.0-38.9, adult: Secondary | ICD-10-CM | POA: Diagnosis not present

## 2017-05-13 DIAGNOSIS — Z713 Dietary counseling and surveillance: Secondary | ICD-10-CM | POA: Diagnosis not present

## 2017-05-13 DIAGNOSIS — I4891 Unspecified atrial fibrillation: Secondary | ICD-10-CM | POA: Diagnosis not present

## 2017-05-13 DIAGNOSIS — Z299 Encounter for prophylactic measures, unspecified: Secondary | ICD-10-CM | POA: Diagnosis not present

## 2017-05-25 DIAGNOSIS — M5416 Radiculopathy, lumbar region: Secondary | ICD-10-CM | POA: Diagnosis not present

## 2017-05-25 DIAGNOSIS — M533 Sacrococcygeal disorders, not elsewhere classified: Secondary | ICD-10-CM | POA: Diagnosis not present

## 2017-05-25 DIAGNOSIS — M47817 Spondylosis without myelopathy or radiculopathy, lumbosacral region: Secondary | ICD-10-CM | POA: Diagnosis not present

## 2017-05-25 DIAGNOSIS — M791 Myalgia: Secondary | ICD-10-CM | POA: Diagnosis not present

## 2017-05-28 ENCOUNTER — Other Ambulatory Visit: Payer: Self-pay | Admitting: Cardiology

## 2017-06-07 ENCOUNTER — Ambulatory Visit (INDEPENDENT_AMBULATORY_CARE_PROVIDER_SITE_OTHER): Payer: PPO | Admitting: Cardiology

## 2017-06-07 ENCOUNTER — Encounter: Payer: Self-pay | Admitting: Cardiology

## 2017-06-07 VITALS — BP 150/77 | HR 50 | Ht 68.0 in | Wt 253.0 lb

## 2017-06-07 DIAGNOSIS — I10 Essential (primary) hypertension: Secondary | ICD-10-CM | POA: Diagnosis not present

## 2017-06-07 DIAGNOSIS — I480 Paroxysmal atrial fibrillation: Secondary | ICD-10-CM

## 2017-06-07 DIAGNOSIS — I2510 Atherosclerotic heart disease of native coronary artery without angina pectoris: Secondary | ICD-10-CM | POA: Diagnosis not present

## 2017-06-07 DIAGNOSIS — Z95810 Presence of automatic (implantable) cardiac defibrillator: Secondary | ICD-10-CM

## 2017-06-07 DIAGNOSIS — I255 Ischemic cardiomyopathy: Secondary | ICD-10-CM | POA: Diagnosis not present

## 2017-06-07 NOTE — Patient Instructions (Signed)

## 2017-06-07 NOTE — Progress Notes (Signed)
Cardiology Office Note  Date: 06/07/2017   ID: [name], DOB 10-05-1941, MRN [id number]  PCP: [name], MD  Primary Cardiologist: Nona Dell, MD   Chief Complaint  Patient presents with  . Coronary Artery Disease    History of Present Illness: [name] is a 76 y.o. male last seen in January. He presents for a routine follow-up visit. Reports no angina symptoms or increasing shortness of breath. Still goes dancing when he can. He states that his wife underwent recent mastectomy for breast cancer, seems to be recuperating well.  Patient continues to follow with the device clinic with Dr. Johney Frame, Medtronic biventricular ICD in place. He underwent ICD generator replacement with in May.  Follow-up echocardiogram from April of this year is outlined below, LVEF 55-60% range with mild diastolic dysfunction. I reviewed his cardiac medications which are outlined below in unchanged. He reports compliance. Blood pressure was elevated today compared to last visit, he does admit to increased salt intake recently.  He continues on Coumadin with follow-up per Dr. Sherryll Burger.  Past Medical History:  Diagnosis Date  . Anemia   . Anemia-chronic    a. Pt reports h/o anemia - was offered IV iron in past by nephrologist but declined and has taken PO instead.  . Aneurysm, cerebral   . BPH (benign prostatic hypertrophy)   . Cardiomyopathy, ischemic    LVEF 25-35%.  . Chronic kidney disease, stage III (moderate)    Followed by Dr. Fausto Skillern.  . Coronary atherosclerosis of native coronary artery    a. DES to RCA 03/2009. b. s/p PTCA to RCA for ISR, 05/2010. c. 03/2013 NSTEMI 2/2 severe prox RCA s/p PTCA/DES, med rx for residual dz.  . Essential hypertension, benign   . Hyperlipidemia   . LBBB (left bundle branch block)    s/p BiV ICD Implanted by Dr Graciela Husbands  . Morbid obesity (HCC)   . MRSA (methicillin resistant Staphylococcus aureus)   . Myocardial infarction (HCC)   . Paroxysmal atrial  fibrillation (HCC)    a. Coumadin discontinue in 2010 when the patient required ASA/Plavix for stenting. b. Coumadin restarted 05/2013, Plavix continued, ASA stopped.   . Pulmonary nodule    CXR, 03/2013, [facility] - not described on any subsequent chest x-rays however, could be a vascular structure  . TIA (transient ischemic attack)    Multiple TIAs  . Type 2 diabetes mellitus (HCC)     Past Surgical History:  Procedure Laterality Date  . BIV ICD GENERATOR CHANGEOUT N/A 03/18/2017   Procedure: BiV ICD Generator Changeout;  Surgeon: Hillis Range, MD;  Location: [facility] INVASIVE CV LAB;  Service: Cardiovascular;  Laterality: N/A;  . CHOLECYSTECTOMY    . ICD placement  May 06, 2011   Medtronic Protecta XT CRT-D  . LEFT HEART CATHETERIZATION WITH CORONARY ANGIOGRAM N/A 04/17/2013   Procedure: LEFT HEART CATHETERIZATION WITH CORONARY ANGIOGRAM;  Surgeon: Vesta Mixer, MD;  Location: [facility] CATH LAB;  Service: Cardiovascular;  Laterality: N/A;  . LUNG SURGERY     24 - 24 yrs. old  . TOTAL KNEE ARTHROPLASTY    . VASECTOMY      Current Outpatient Prescriptions  Medication Sig Dispense Refill  . carvedilol (COREG) 12.5 MG tablet TAKE ONE AND ONE-HALF TABLETS BY MOUTH TWICE DAILY WITH MEALS (DUE FOR FOLLOW UP) 270 tablet 3  . clopidogrel (PLAVIX) 75 MG tablet Take 1 tablet (75 mg total) by mouth daily. 30 tablet 6  . hydrALAZINE (APRESOLINE) 25 MG tablet  TAKE ONE TABLET BY MOUTH THREE TIMES DAILY 270 tablet 3  . HYDROcodone-acetaminophen (NORCO) 7.5-325 MG tablet Take 1 tablet by mouth 4 (four) times daily as needed for moderate pain.    Marland Kitchen insulin lispro protamine-insulin lispro (HUMALOG 75/25) (75-25) 100 UNIT/ML SUSP Inject 22-35 Units into the skin See admin instructions. Takes 35 units in the morning with breakfast and 35 units at supper (Will take 22-25 at lunch time occasionally)    . isosorbide mononitrate (IMDUR) 30 MG 24 hr tablet TAKE ONE TABLET BY MOUTH ONCE DAILY 90 tablet 3  .  L-Methylfolate-Algae-B12-B6 (METANX) 3-90.314-2-35 MG CAPS TAKE ONE CAPSULE BY MOUTH ONCE DAILY 60 capsule 3  . levothyroxine (SYNTHROID, LEVOTHROID) 25 MCG tablet Take 1 tablet by mouth daily.    Marland Kitchen lisinopril (PRINIVIL,ZESTRIL) 2.5 MG tablet Take 2.5 mg by mouth daily.    Marland Kitchen loratadine (CLARITIN) 10 MG tablet Take 10 mg by mouth daily.    . nitroGLYCERIN (NITROSTAT) 0.4 MG SL tablet Place 0.4 mg under the tongue every 5 (five) minutes as needed for chest pain. For chest pain    . Omega-3 Fatty Acids (FISH OIL) 1000 MG CAPS Take 1,000 mg by mouth daily.    Marland Kitchen sulfaSALAzine (AZULFIDINE EN-TABS) 500 MG EC tablet Take 1 tablet (500 mg total) by mouth 2 (two) times daily. 60 tablet 2  . torsemide (DEMADEX) 10 MG tablet Take 1 tablet (10 mg total) by mouth 2 (two) times daily. 60 tablet 3  . warfarin (COUMADIN) 5 MG tablet Take 0.5-1 tablets (2.5-5 mg total) by mouth daily at 6 PM. Takes 2.5mg  on Sunday, Monday, Wednesday and Friday  Takes 5mg  on Tuesday, Thursday and Sunday    . Zinc 50 MG CAPS Take 50 mg by mouth daily.      No current facility-administered medications for this visit.    Allergies:  Codeine   Social History: The patient  reports that he quit smoking about 52 years ago. His smoking use included Cigarettes. He started smoking about 67 years ago. He has a 6.00 pack-year smoking history. He quit smokeless tobacco use about 52 years ago. He reports that he drinks alcohol. He reports that he does not use drugs.   ROS:  Please see the history of present illness. Otherwise, complete review of systems is positive for bone spur in his right foot.  All other systems are reviewed and negative.   Physical Exam: VS:  BP (!) 150/77   Pulse (!) 50   Ht 5\' 8"  (1.727 m)   Wt 253 lb (114.8 kg)   SpO2 95%   BMI 38.47 kg/m , BMI Body mass index is 38.47 kg/m.  Wt Readings from Last 3 Encounters:  06/07/17 253 lb (114.8 kg)  03/30/17 256 lb (116.1 kg)  03/18/17 250 lb (113.4 kg)    Obese  male, appears comfortable at rest.  HEENT: Conjunctiva and lids normal, oropharynx clear.  Neck: Supple, no elevated JVP with increased girth.  Cardiac: Regular rate and rhythm, no S3.  Lungs: Clear, nonlabored. Abdomen: Obese, nontender, bowel sounds present.  Skin: Warm and dry.  Extremities: 1+ leg edema. Musculoskeletal: No kyphosis. Neuropsychiatric: Alert and oriented 3, affect appropriate.  ECG: I personally reviewed the tracing from 03/18/2017 which showed a dual-chamber paced rhythm.  Recent Labwork:  April 2018: Hemoglobin 13.7, platelets 185, BUN 50, creatinine 1.9, potassium 4.9, INR 1.8  Other Studies Reviewed Today:  Echocardiogram 03/03/2017: Study Conclusions  - Procedure narrative: Transthoracic echocardiography. Image   quality was  suboptimal. The study was technically difficult, as a   result of poor sound wave transmission and body habitus. - Left ventricle: The cavity size was normal. Wall thickness was   increased in a pattern of moderate LVH. Systolic function was   normal. The estimated ejection fraction was in the range of 55%   to 60%. Images were inadequate for LV wall motion assessment. No   gross regional variation on apical 4-chamber views. Doppler   parameters are consistent with abnormal left ventricular   relaxation (grade 1 diastolic dysfunction). Doppler parameters   are consistent with high ventricular filling pressure. - Aortic valve: Mildly to moderately calcified annulus. - Mitral valve: Mildly calcified annulus. There was mild   regurgitation. - Left atrium: The atrium was moderately dilated. - Right ventricle: Pacer wire or catheter noted in right ventricle.  Assessment and Plan:  1. CAD with multiple prior RCA interventions, symptomatically stable without angina on present medical therapy. We are continuing Plavix long-term.  2. Ischemic cardiomyopathy with normalization of LVEF, recently assessed by repeat echocardiogram in  April with LVEF 55-60%.  3. Medtronic CRT-D in place, recent generator change noted per Dr. Johney Frame. Keep follow-up in the device clinic.  4. Paroxysmal atrial fibrillation, no active palpitations. He continues on Coumadin for stroke prophylaxis and history of recurrent TIAs.  5. Essential hypertension, blood pressure up today compared to last visit. He does admit to more salt intake recently. No changes made present regimen. We discussed diet.  Current medicines were reviewed with the patient today.  Disposition: Follow-up in 6 months.  Signed, Jonelle Sidle, MD, [facility] 06/07/2017 11:15 AM    [facility] at [facility] [address] [place], [place], [place] [address] Phone: [phone]; Fax: [phone]

## 2017-06-08 DIAGNOSIS — E039 Hypothyroidism, unspecified: Secondary | ICD-10-CM | POA: Diagnosis not present

## 2017-06-08 DIAGNOSIS — I10 Essential (primary) hypertension: Secondary | ICD-10-CM | POA: Diagnosis not present

## 2017-06-08 DIAGNOSIS — G609 Hereditary and idiopathic neuropathy, unspecified: Secondary | ICD-10-CM | POA: Diagnosis not present

## 2017-06-08 DIAGNOSIS — E1165 Type 2 diabetes mellitus with hyperglycemia: Secondary | ICD-10-CM | POA: Diagnosis not present

## 2017-06-08 DIAGNOSIS — N189 Chronic kidney disease, unspecified: Secondary | ICD-10-CM | POA: Diagnosis not present

## 2017-06-11 ENCOUNTER — Encounter: Payer: Self-pay | Admitting: Internal Medicine

## 2017-06-11 ENCOUNTER — Ambulatory Visit (INDEPENDENT_AMBULATORY_CARE_PROVIDER_SITE_OTHER): Payer: PPO | Admitting: Internal Medicine

## 2017-06-11 VITALS — BP 100/54 | HR 56 | Ht 68.0 in | Wt 253.0 lb

## 2017-06-11 DIAGNOSIS — Z95810 Presence of automatic (implantable) cardiac defibrillator: Secondary | ICD-10-CM

## 2017-06-11 DIAGNOSIS — I255 Ischemic cardiomyopathy: Secondary | ICD-10-CM | POA: Diagnosis not present

## 2017-06-11 DIAGNOSIS — I10 Essential (primary) hypertension: Secondary | ICD-10-CM | POA: Diagnosis not present

## 2017-06-11 DIAGNOSIS — I480 Paroxysmal atrial fibrillation: Secondary | ICD-10-CM | POA: Diagnosis not present

## 2017-06-11 DIAGNOSIS — Z6837 Body mass index (BMI) 37.0-37.9, adult: Secondary | ICD-10-CM | POA: Diagnosis not present

## 2017-06-11 DIAGNOSIS — Z299 Encounter for prophylactic measures, unspecified: Secondary | ICD-10-CM | POA: Diagnosis not present

## 2017-06-11 DIAGNOSIS — E1122 Type 2 diabetes mellitus with diabetic chronic kidney disease: Secondary | ICD-10-CM | POA: Diagnosis not present

## 2017-06-11 DIAGNOSIS — I4891 Unspecified atrial fibrillation: Secondary | ICD-10-CM | POA: Diagnosis not present

## 2017-06-11 LAB — CUP PACEART INCLINIC DEVICE CHECK
Battery Remaining Longevity: 119 mo
Battery Voltage: 3.12 V
Brady Statistic AP VP Percent: 14.31 %
Brady Statistic AS VP Percent: 83.79 %
Brady Statistic RA Percent Paced: 14.56 %
Brady Statistic RV Percent Paced: 16.15 %
Date Time Interrogation Session: 20180727121826
HighPow Impedance: 48 Ohm
HighPow Impedance: 60 Ohm
Implantable Lead Implant Date: 20120620
Implantable Lead Location: 753859
Implantable Lead Location: 753860
Implantable Lead Model: 4196
Implantable Lead Model: 5076
Lead Channel Impedance Value: 1083 Ohm
Lead Channel Impedance Value: 304 Ohm
Lead Channel Impedance Value: 437 Ohm
Lead Channel Impedance Value: 665 Ohm
Lead Channel Pacing Threshold Amplitude: 0.5 V
Lead Channel Pacing Threshold Amplitude: 0.75 V
Lead Channel Pacing Threshold Pulse Width: 0.4 ms
Lead Channel Pacing Threshold Pulse Width: 0.4 ms
Lead Channel Sensing Intrinsic Amplitude: 1 mV
Lead Channel Setting Pacing Amplitude: 1.5 V
Lead Channel Setting Pacing Amplitude: 2 V
Lead Channel Setting Sensing Sensitivity: 0.3 mV
MDC IDC LEAD IMPLANT DT: 20120620
MDC IDC LEAD IMPLANT DT: 20120620
MDC IDC LEAD LOCATION: 753858
MDC IDC MSMT LEADCHNL LV IMPEDANCE VALUE: 513 Ohm
MDC IDC MSMT LEADCHNL LV PACING THRESHOLD AMPLITUDE: 0.75 V
MDC IDC MSMT LEADCHNL LV PACING THRESHOLD PULSEWIDTH: 0.4 ms
MDC IDC MSMT LEADCHNL RA SENSING INTR AMPL: 1.125 mV
MDC IDC MSMT LEADCHNL RV IMPEDANCE VALUE: 380 Ohm
MDC IDC MSMT LEADCHNL RV SENSING INTR AMPL: 9.25 mV
MDC IDC MSMT LEADCHNL RV SENSING INTR AMPL: 9.625 mV
MDC IDC PG IMPLANT DT: 20180503
MDC IDC SET LEADCHNL LV PACING AMPLITUDE: 1.25 V
MDC IDC SET LEADCHNL LV PACING PULSEWIDTH: 0.4 ms
MDC IDC SET LEADCHNL RV PACING PULSEWIDTH: 0.4 ms
MDC IDC STAT BRADY AP VS PERCENT: 0.38 %
MDC IDC STAT BRADY AS VS PERCENT: 1.53 %

## 2017-06-11 NOTE — Patient Instructions (Addendum)
Medication Instructions:   Your physician recommends that you continue on your current medications as directed. Please refer to the Current Medication list given to you today.  Labwork:  NONE  Testing/Procedures:  NONE  Follow-Up: Your physician recommends that you schedule a follow-up appointment in: 1 year. Please schedule this appointment today before leaving the office.   Any Other Special Instructions Will Be Listed Below (If Applicable).  Your next device check from home is on 09/13/17.  You are being enrolled in the [facility] clinic. You will be contacted by Randon Goldsmith RN about this.  If you need a refill on your cardiac medications before your next appointment, please call your pharmacy.

## 2017-06-11 NOTE — Progress Notes (Signed)
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  Since his recent ICD generator change, the patient reports doing very well.  Today, he denies symptoms of palpitations, chest pain, shortness of breath,  lower extremity edema, dizziness, presyncope, syncope, or ICD shocks.  The patient is otherwise without complaint today.   Past Medical History:  Diagnosis Date  . Anemia   . Anemia-chronic    a. Pt reports h/o anemia - was offered IV iron in past by nephrologist but declined and has taken PO instead.  . Aneurysm, cerebral   . BPH (benign prostatic hypertrophy)   . Cardiomyopathy, ischemic    LVEF 25-35%.  . Chronic kidney disease, stage III (moderate)    Followed by Dr. Fausto [name].  . Coronary atherosclerosis of native coronary artery    a. DES to RCA 03/2009. b. s/p PTCA to RCA for ISR, 05/2010. c. 03/2013 NSTEMI 2/2 severe prox RCA s/p PTCA/DES, med rx for residual dz.  . Essential hypertension, benign   . Hyperlipidemia   . LBBB (left bundle branch block)    s/p BiV ICD Implanted by Dr Graciela Husbands  . Morbid obesity (HCC)   . MRSA (methicillin resistant Staphylococcus aureus)   . Myocardial infarction (HCC)   . Paroxysmal atrial fibrillation (HCC)    a. Coumadin discontinue in 2010 when the patient required ASA/Plavix for stenting. b. Coumadin restarted 05/2013, Plavix continued, ASA stopped.   . Pulmonary nodule    CXR, 03/2013, [facility] - not described on any subsequent chest x-rays however, could be a vascular structure  . TIA (transient ischemic attack)    Multiple TIAs  . Type 2 diabetes mellitus (HCC)    Past Surgical History:  Procedure Laterality Date  . BIV ICD GENERATOR CHANGEOUT N/A 03/18/2017   Medtronic Claria MRI conditional CRT D device implated by Dr Johney Frame  . CHOLECYSTECTOMY    . ICD placement  05/06/2011   Medtronic CRT-D  . LEFT HEART CATHETERIZATION WITH CORONARY  ANGIOGRAM N/A 04/17/2013   Procedure: LEFT HEART CATHETERIZATION WITH CORONARY ANGIOGRAM;  Surgeon: Vesta Mixer, MD;  Location: [facility] CATH LAB;  Service: Cardiovascular;  Laterality: N/A;  . LUNG SURGERY     24 - 25 yrs. old  . TOTAL KNEE ARTHROPLASTY    . VASECTOMY      ROS- all systems are reviewed and negative except as per HPI above  Current Outpatient Prescriptions  Medication Sig Dispense Refill  . carvedilol (COREG) 12.5 MG tablet TAKE ONE AND ONE-HALF TABLETS BY MOUTH TWICE DAILY WITH MEALS (DUE FOR FOLLOW UP) 270 tablet 3  . clopidogrel (PLAVIX) 75 MG tablet Take 1 tablet (75 mg total) by mouth daily. 30 tablet 6  . hydrALAZINE (APRESOLINE) 25 MG tablet TAKE ONE TABLET BY MOUTH THREE TIMES DAILY 270 tablet 3  . HYDROcodone-acetaminophen (NORCO) 7.5-325 MG tablet Take 1 tablet by mouth 4 (four) times daily as needed for moderate pain.    Marland Kitchen insulin lispro protamine-insulin lispro (HUMALOG 75/25) (75-25) 100 UNIT/ML SUSP Inject 22-35 Units into the skin See admin instructions. Takes 35 units in the morning with breakfast and 35 units at supper (Will take 22-25 at lunch time occasionally)    . isosorbide mononitrate (IMDUR) 30 MG 24 hr tablet TAKE ONE TABLET BY MOUTH ONCE DAILY 90 tablet 3  . L-Methylfolate-Algae-B12-B6 (METANX) 3-90.314-2-35 MG CAPS TAKE ONE CAPSULE BY MOUTH ONCE DAILY 60 capsule 3  .  levothyroxine (SYNTHROID, LEVOTHROID) 25 MCG tablet Take 1 tablet by mouth daily.    Marland Kitchen lisinopril (PRINIVIL,ZESTRIL) 2.5 MG tablet Take 2.5 mg by mouth daily.    Marland Kitchen loratadine (CLARITIN) 10 MG tablet Take 10 mg by mouth daily.    . nitroGLYCERIN (NITROSTAT) 0.4 MG SL tablet Place 0.4 mg under the tongue every 5 (five) minutes as needed for chest pain. For chest pain    . Omega-3 Fatty Acids (FISH OIL) 1000 MG CAPS Take 1,000 mg by mouth daily.    Marland Kitchen sulfaSALAzine (AZULFIDINE EN-TABS) 500 MG EC tablet Take 1 tablet (500 mg total) by mouth 2 (two) times daily. 60 tablet 2  . torsemide  (DEMADEX) 10 MG tablet Take 1 tablet (10 mg total) by mouth 2 (two) times daily. 60 tablet 3  . warfarin (COUMADIN) 5 MG tablet Take 0.5-1 tablets (2.5-5 mg total) by mouth daily at 6 PM. Takes 2.5mg  on Sunday, Monday, Wednesday and Friday  Takes 5mg  on Tuesday, Thursday and Sunday    . Zinc 50 MG CAPS Take 50 mg by mouth daily.      No current facility-administered medications for this visit.     Physical Exam: Vitals:   06/11/17 0811  BP: (!) 100/54  Pulse: (!) 56  SpO2: 94%  Weight: 253 lb (114.8 kg)  Height: 5\' 8"  (1.727 m)    GEN- The patient is well appearing, alert and oriented x 3 today.   Head- normocephalic, atraumatic Eyes-  Sclera clear, conjunctiva pink Ears- hearing intact Oropharynx- clear Lungs- Clear to ausculation bilaterally, normal work of breathing Chest- ICD pocket is well healed Heart- Regular rate and rhythm, no murmurs, rubs or gallops, PMI not laterally displaced GI- soft, NT, ND, + BS Extremities- no clubbing, cyanosis, or edema  ICD interrogation- reviewed in detail today,  See PACEART report ekg today reveals sinus rhythm with Biv Pacing (QRS 152 msec) similar to prior  Assessment and Plan:  1.  Chronic systolic dysfunction euvolemic today Stable on an appropriate medical regimen Normal ICD function See Pace Art report No changes today EF has recovered (55%) with CRT He feels a little "weaker" with new ICD.  Blood pressure is a little low today.  Could consider backing off on CHF medicines given normalization of EF.  Will defer to Dr 06/13/17 who recently saw him. Enroll in ICM device clinic  2. Hypertensive cardiovascular disease Stable No change required today   3. CAD No ischemic symptoms  4. afib Well controlled On coumadin for chads2vasc socre of 8  Return to see Dr as scheduled  [facility] Return to see me in 1 year unless problems arise Enroll in [facility] device clinic  Diona Browner MD, [facility] 06/11/2017 8:54 AM

## 2017-06-17 DIAGNOSIS — E1129 Type 2 diabetes mellitus with other diabetic kidney complication: Secondary | ICD-10-CM | POA: Diagnosis not present

## 2017-06-17 DIAGNOSIS — E669 Obesity, unspecified: Secondary | ICD-10-CM | POA: Diagnosis not present

## 2017-06-17 DIAGNOSIS — Z79899 Other long term (current) drug therapy: Secondary | ICD-10-CM | POA: Diagnosis not present

## 2017-06-17 DIAGNOSIS — I10 Essential (primary) hypertension: Secondary | ICD-10-CM | POA: Diagnosis not present

## 2017-06-17 DIAGNOSIS — I4891 Unspecified atrial fibrillation: Secondary | ICD-10-CM | POA: Diagnosis not present

## 2017-06-17 DIAGNOSIS — R253 Fasciculation: Secondary | ICD-10-CM | POA: Diagnosis not present

## 2017-06-17 DIAGNOSIS — Z299 Encounter for prophylactic measures, unspecified: Secondary | ICD-10-CM | POA: Diagnosis not present

## 2017-06-17 DIAGNOSIS — R809 Proteinuria, unspecified: Secondary | ICD-10-CM | POA: Diagnosis not present

## 2017-06-17 DIAGNOSIS — E785 Hyperlipidemia, unspecified: Secondary | ICD-10-CM | POA: Diagnosis not present

## 2017-06-17 DIAGNOSIS — N183 Chronic kidney disease, stage 3 (moderate): Secondary | ICD-10-CM | POA: Diagnosis not present

## 2017-06-18 ENCOUNTER — Other Ambulatory Visit: Payer: Self-pay | Admitting: Cardiology

## 2017-06-22 DIAGNOSIS — M47817 Spondylosis without myelopathy or radiculopathy, lumbosacral region: Secondary | ICD-10-CM | POA: Diagnosis not present

## 2017-06-22 DIAGNOSIS — M533 Sacrococcygeal disorders, not elsewhere classified: Secondary | ICD-10-CM | POA: Diagnosis not present

## 2017-06-22 DIAGNOSIS — M5416 Radiculopathy, lumbar region: Secondary | ICD-10-CM | POA: Diagnosis not present

## 2017-06-22 DIAGNOSIS — M791 Myalgia: Secondary | ICD-10-CM | POA: Diagnosis not present

## 2017-06-23 NOTE — Progress Notes (Signed)
Office Visit Note  Patient: [name]             Date of Birth: 03-Feb-1941           MRN: [id number]             PCP: [name], MD Referring: [name], MD Visit Date: 06/30/2017 Occupation: @GUAROCC @    Subjective:  Pain in hands and wrist joints   History of Present Illness: [name] is a 76 y.o. male with history of sero positive erosive arthritis. He states the summer months are not as bad for him. He's been having some discomfort in his bilateral wrist joints in his hands. He has some discomfort in his knee joints which have been replaced. He does have some swelling in his wrist joints. He's been very active.  Activities of Daily Living:  Patient reports morning stiffness for 15 minutes.   Patient Denies nocturnal pain.  Difficulty dressing/grooming: Denies Difficulty climbing stairs: Denies Difficulty getting out of chair: Denies Difficulty using hands for taps, buttons, cutlery, and/or writing: Denies   Review of Systems  Constitutional: Positive for fatigue. Negative for night sweats and weakness ( ).  HENT: Negative for mouth sores, mouth dryness and nose dryness.   Eyes: Negative for redness and dryness.  Respiratory: Negative for shortness of breath and difficulty breathing.   Cardiovascular: Positive for hypertension. Negative for chest pain, irregular heartbeat and swelling in legs/feet.  Gastrointestinal: Negative for constipation and diarrhea.  Endocrine: Negative for increased urination.  Musculoskeletal: Positive for arthralgias, joint pain, joint swelling and morning stiffness. Negative for myalgias, muscle weakness, muscle tenderness and myalgias.  Skin: Negative for color change, rash, hair loss, nodules/bumps, skin tightness, ulcers and sensitivity to sunlight.  Allergic/Immunologic: Negative for susceptible to infections.  Neurological: Negative for dizziness, fainting, memory loss and night sweats.  Hematological: Negative for swollen  glands.  Psychiatric/Behavioral: Positive for sleep disturbance. Negative for depressed mood. The patient is not nervous/anxious.     PMFS History:  Patient Active Problem List   Diagnosis Date Noted  . Primary osteoarthritis of both hands 06/28/2017  . Primary osteoarthritis of both feet 06/28/2017  . Rheumatoid arthritis involving multiple sites with positive rheumatoid factor (HCC) 03/27/2017  . Pain in both hands 03/24/2017  . H/O total knee replacement, bilateral 03/24/2017  . Pain in both feet 03/24/2017  . High risk medication use 03/24/2017  . Other fatigue 03/24/2017  . History of hypothyroidism 03/24/2017  . Former smoker 03/24/2017  . PVD (peripheral vascular disease) with claudication (HCC) 07/11/2014  . TIA (transient ischemic attack) 05/30/2013  . Long term (current) use of anticoagulants 05/30/2013  . CKD (chronic kidney disease) stage 3, GFR 30-59 ml/min 05/29/2013  . Paroxysmal atrial fibrillation (HCC)   . Cardiomyopathy, ischemic   . Biventricular ICD (implantable cardioverter-defibrillator) in place 08/17/2012  . DM 05/15/2010  . Hypertensive cardiovascular disease 04/25/2010  . HYPERLIPIDEMIA-MIXED 08/28/2009  . CAD, NATIVE VESSEL 08/28/2009  . LBBB 08/28/2009  . SYSTOLIC HEART FAILURE, CHRONIC 08/28/2009    Past Medical History:  Diagnosis Date  . Anemia   . Anemia-chronic    a. Pt reports h/o anemia - was offered IV iron in past by nephrologist but declined and has taken PO instead.  . Aneurysm, cerebral   . BPH (benign prostatic hypertrophy)   . Cardiomyopathy, ischemic    LVEF 25-35%.  . Chronic kidney disease, stage III (moderate)    Followed by Dr. Fausto Skillern.  . Coronary atherosclerosis  of native coronary artery    a. DES to RCA 03/2009. b. s/p PTCA to RCA for ISR, 05/2010. c. 03/2013 NSTEMI 2/2 severe prox RCA s/p PTCA/DES, med rx for residual dz.  . Essential hypertension, benign   . Hyperlipidemia   . LBBB (left bundle branch block)    s/p BiV  ICD Implanted by Dr Graciela Husbands  . Morbid obesity (HCC)   . MRSA (methicillin resistant Staphylococcus aureus)   . Myocardial infarction (HCC)   . Paroxysmal atrial fibrillation (HCC)    a. Coumadin discontinue in 2010 when the patient required ASA/Plavix for stenting. b. Coumadin restarted 05/2013, Plavix continued, ASA stopped.   . Pulmonary nodule    CXR, 03/2013, [facility] - not described on any subsequent chest x-rays however, could be a vascular structure  . TIA (transient ischemic attack)    Multiple TIAs  . Type 2 diabetes mellitus (HCC)     Family History  Problem Relation Age of Onset  . Diabetes Mother        Bilateral leg amputation  . Heart disease Mother        Before age 63  . Hypertension Mother   . Cancer Father        Prostate and Bone  . Diabetes Father   . Cancer Brother        Prostate  . Heart disease Other        family h/o premature cardiovascular disease  . Cancer Sister        Colon   . Hypertension Sister    Past Surgical History:  Procedure Laterality Date  . BIV ICD GENERATOR CHANGEOUT N/A 03/18/2017   Medtronic Claria MRI conditional CRT D device implated by Dr Johney Frame  . CHOLECYSTECTOMY    . ICD placement  05/06/2011   Medtronic CRT-D  . LEFT HEART CATHETERIZATION WITH CORONARY ANGIOGRAM N/A 04/17/2013   Procedure: LEFT HEART CATHETERIZATION WITH CORONARY ANGIOGRAM;  Surgeon: Vesta Mixer, MD;  Location: [facility] CATH LAB;  Service: Cardiovascular;  Laterality: N/A;  . LUNG SURGERY     24 - 24 yrs. old  . TOTAL KNEE ARTHROPLASTY    . VASECTOMY     Social History   Social History Narrative  . No narrative on file     Objective: Vital Signs: BP (!) 149/63 (BP Location: Left Arm, Patient Position: Sitting, Cuff Size: Normal)   Pulse (!) 55   Ht 5\' 8"  (1.727 m)   Wt 255 lb (115.7 kg)   BMI 38.77 kg/m    Physical Exam  Constitutional: He is oriented to person, place, and time. He appears well-developed and well-nourished.  HENT:  Head: Normocephalic  and atraumatic.  Eyes: Pupils are equal, round, and reactive to light. Conjunctivae and EOM are normal.  Neck: Normal range of motion. Neck supple.  Cardiovascular: Normal rate, regular rhythm and normal heart sounds.   Pulmonary/Chest: Effort normal and breath sounds normal.  Abdominal: Soft. Bowel sounds are normal.  Neurological: He is alert and oriented to person, place, and time.  Skin: Skin is warm and dry. Capillary refill takes less than 2 seconds.  Psychiatric: He has a normal mood and affect. His behavior is normal.  Nursing note and vitals reviewed.    Musculoskeletal Exam: C-spine and thoracic and lumbar spine fairly good range of motion with minimal discomfort. He has good range of motion of her shoulders and elbow joints. He has very limited range of motion of his bilateral wrist joints with synovitis and also  synovitis over her MCP joints. His bilateral total knee replacement appears to be doing well. He has some pitting edema on his bilateral lower extremities but no synovitis was noted.  CDAI Exam: CDAI Homunculus Exam:   Tenderness:  RUE: wrist LUE: wrist Right hand: 1st MCP, 2nd MCP, 3rd MCP, 4th MCP and 5th MCP Left hand: 1st MCP, 2nd MCP, 3rd MCP and 4th MCP  Joint Counts:  CDAI Tender Joint count: 11 CDAI Swollen Joint count: 0  Global Assessments:  Patient Global Assessment: 6 Provider Global Assessment: 6  CDAI Calculated Score: 23    Investigation: No additional findings.   Imaging: No results found.  Speciality Comments: No specialty comments available.    Procedures:  No procedures performed Allergies: Codeine   Assessment / Plan:     Visit Diagnoses: Rheumatoid arthritis involving multiple sites with positive rheumatoid factor (HCC) - Positive RF, positive synovitis, erosive disease. He continues to have synovitis over bilateral wrist joints in his MCP joints with incomplete range of motion and incomplete fist formation.  High risk  medication use - SSZ have been discussed with patient, but he declined medication for his arthritis. We had detailed discussion regarding treatment options today. He states he will take the information on sulfasalazine he signed the consent last visit. He wants to hold off on the treatment. He states he has multiple medical problems which take prior 3 over rheumatoid arthritis. He will let us know when he is ready to start the treatment. Side effects were again reviewed today. (PLQ-avoid due to arrythmia)   History of MRSA infection - recurrent  Primary osteoarthritis of both hands: Causes a stiffness  Primary osteoarthritis of both feet: Proper fitting shoes were discussed.  Other fatigue  H/O total knee replacement, bilateral: He has some chronic discomfort.  History of hypothyroidism  History of atrial fibrillation  Long term (current) use of anticoagulants  History of chronic heart failure  History of TIA (transient ischemic attack)  History of left bundle branch block  History of coronary artery disease  History of hyperlipidemia  History of diabetes mellitus  History of chronic kidney disease  History of placement of internal cardiac defibrillator  Medically complex patient    Orders: No orders of the defined types were placed in this encounter.  No orders of the defined types were placed in this encounter.   Face-to-face time spent with patient was 30 minutes. Greater than 50% of time was spent in counseling and coordination of care.  Follow-Up Instructions: Return if symptoms worsen or fail to improve, for Rheumatoid arthritis.   Pollyann Savoy, MD  Note - This record has been created using Animal nutritionist.  Chart creation errors have been sought, but may not always  have been located. Such creation errors do not reflect on  the standard of medical care.

## 2017-06-24 ENCOUNTER — Telehealth: Payer: Self-pay | Admitting: Cardiology

## 2017-06-24 NOTE — Telephone Encounter (Signed)
Attempted to return call. No answer.

## 2017-06-24 NOTE — Telephone Encounter (Signed)
[name] called stating that he is having issues with low blood pressure. States that he is having a lot of Fatigue.  Please call [phone].

## 2017-06-28 DIAGNOSIS — M19071 Primary osteoarthritis, right ankle and foot: Secondary | ICD-10-CM | POA: Insufficient documentation

## 2017-06-28 DIAGNOSIS — M19042 Primary osteoarthritis, left hand: Secondary | ICD-10-CM

## 2017-06-28 DIAGNOSIS — M19041 Primary osteoarthritis, right hand: Secondary | ICD-10-CM | POA: Insufficient documentation

## 2017-06-28 DIAGNOSIS — M19072 Primary osteoarthritis, left ankle and foot: Secondary | ICD-10-CM

## 2017-06-28 NOTE — Telephone Encounter (Signed)
Patient stated that about 18-20 years ago had TIA's.  Stated that BP runs low on him occasionally.  Usually fine if up moving around, but after sit down for a bit, bp seems to drop.  Does seem fatigued when BP drops.  Could not give me any readings other than it drop to somewhere like 100/50.    Saw Neurologist recently - found small aneurysm base of skull.  Had been 13-14 years ago.  Is due to see neurologist again in 6 weeks, scheduled by PMD.    Stated that he is having issue with right side of face - has episode of numbness or tingly sensation then goes away within 30 seconds.  Stated he used to have these all the time years ago.   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  Is due to see neurologist again in 6 weeks, scheduled by PMD.    Advised will send message to provider for further advice.  In the meantime, suggested he keep log of BP / HR readings.

## 2017-06-29 ENCOUNTER — Other Ambulatory Visit: Payer: Self-pay | Admitting: Cardiology

## 2017-06-29 NOTE — Telephone Encounter (Signed)
Agree that he should see a neurologist based on some of the symptoms that he is describing and prior history of TIAs. It is not clear whether this is related to blood pressure or not, but he does need to keep a log so that we can get a sense of what his blood pressure trend is, and able to make adjustments in medications if necessary.

## 2017-06-29 NOTE — Telephone Encounter (Signed)
Patient notified.  He will continue to keep log & return to office for MD review.

## 2017-06-30 ENCOUNTER — Encounter: Payer: Self-pay | Admitting: Rheumatology

## 2017-06-30 ENCOUNTER — Ambulatory Visit (INDEPENDENT_AMBULATORY_CARE_PROVIDER_SITE_OTHER): Payer: PPO | Admitting: Rheumatology

## 2017-06-30 VITALS — BP 149/63 | HR 55 | Ht 68.0 in | Wt 255.0 lb

## 2017-06-30 DIAGNOSIS — M19071 Primary osteoarthritis, right ankle and foot: Secondary | ICD-10-CM | POA: Diagnosis not present

## 2017-06-30 DIAGNOSIS — Z8614 Personal history of Methicillin resistant Staphylococcus aureus infection: Secondary | ICD-10-CM | POA: Diagnosis not present

## 2017-06-30 DIAGNOSIS — Z87448 Personal history of other diseases of urinary system: Secondary | ICD-10-CM

## 2017-06-30 DIAGNOSIS — Z96653 Presence of artificial knee joint, bilateral: Secondary | ICD-10-CM | POA: Diagnosis not present

## 2017-06-30 DIAGNOSIS — Z79899 Other long term (current) drug therapy: Secondary | ICD-10-CM

## 2017-06-30 DIAGNOSIS — Z7901 Long term (current) use of anticoagulants: Secondary | ICD-10-CM

## 2017-06-30 DIAGNOSIS — M19041 Primary osteoarthritis, right hand: Secondary | ICD-10-CM

## 2017-06-30 DIAGNOSIS — Z8639 Personal history of other endocrine, nutritional and metabolic disease: Secondary | ICD-10-CM | POA: Diagnosis not present

## 2017-06-30 DIAGNOSIS — Z789 Other specified health status: Secondary | ICD-10-CM

## 2017-06-30 DIAGNOSIS — R5383 Other fatigue: Secondary | ICD-10-CM

## 2017-06-30 DIAGNOSIS — M0579 Rheumatoid arthritis with rheumatoid factor of multiple sites without organ or systems involvement: Secondary | ICD-10-CM

## 2017-06-30 DIAGNOSIS — M19042 Primary osteoarthritis, left hand: Secondary | ICD-10-CM

## 2017-06-30 DIAGNOSIS — Z8673 Personal history of transient ischemic attack (TIA), and cerebral infarction without residual deficits: Secondary | ICD-10-CM | POA: Diagnosis not present

## 2017-06-30 DIAGNOSIS — Z8679 Personal history of other diseases of the circulatory system: Secondary | ICD-10-CM

## 2017-06-30 DIAGNOSIS — M19072 Primary osteoarthritis, left ankle and foot: Secondary | ICD-10-CM

## 2017-06-30 DIAGNOSIS — Z95810 Presence of automatic (implantable) cardiac defibrillator: Secondary | ICD-10-CM

## 2017-06-30 NOTE — Patient Instructions (Signed)
Standing Labs We placed an order today for your standing lab work.    Please come back and get your standing labs in 1 month then every 3 months.  We have open lab Monday through Friday from 8:30-11:30 AM and 1:30-4 PM at the office of Dr. Shaili Deveshwar.   The office is located at [address], [address], [place], [place] [address] No appointment is necessary.   Labs are drawn by [facility].  You may receive a bill from [facility] for your lab work. If you have any questions regarding directions or hours of operation,  please call [phone].    Sulfasalazine delayed release tablets What is this medicine? SULFASALAZINE (sul fa SAL a zeen) is for ulcerative colitis and certain types of rheumatoid arthritis. This medicine may be used for other purposes; ask your health care provider or pharmacist if you have questions. COMMON BRAND NAME(S): Azulfidine En-Tabs, Sulfazine EC What should I tell my health care provider before I take this medicine? They need to know if you have any of these conditions: -asthma -blood disorders or anemia -glucose-6-phosphate dehydrogenase (G6PD) deficiency -intestinal obstruction -kidney disease -liver disease -porphyria -urinary tract obstruction -an unusual reaction to sulfasalazine, sulfa drugs, salicylates, other medicines, foods, dyes, or preservatives -pregnant or trying to get pregnant -breast-feeding How should I use this medicine? Take this medicine by mouth with a full glass of water. Follow the directions on the prescription label. If the medicine upsets your stomach, take it with food or milk. Do not crush or chew the tablets. Swallow the tablets whole. Take your medicine at regular intervals. Do not take your medicine more often than directed. Do not stop taking except on your doctor's advice. Talk to your pediatrician regarding the use of this medicine in children. Special care may be needed. While this drug may be prescribed for children as  young as 6 years for selected conditions, precautions do apply. Patients over 65 years old may have a stronger reaction and need a smaller dose. Overdosage: If you think you have taken too much of this medicine contact a poison control center or emergency room at once. NOTE: This medicine is only for you. Do not share this medicine with others. What if I miss a dose? If you miss a dose, take it as soon as you can. If it is almost time for your next dose, take only that dose. Do not take double or extra doses. What may interact with this medicine? -digoxin -folic acid This list may not describe all possible interactions. Give your health care provider a list of all the medicines, herbs, non-prescription drugs, or dietary supplements you use. Also tell them if you smoke, drink alcohol, or use illegal drugs. Some items may interact with your medicine. What should I watch for while using this medicine? Visit your doctor or health care professional for regular checks on your progress. You will need frequent blood and urine checks. This medicine can make you more sensitive to the sun. Keep out of the sun. If you cannot avoid being in the sun, wear protective clothing and use sunscreen. Do not use sun lamps or tanning beds/booths. Drink plenty of water while taking this medicine. Tell your doctor if you see the tablet in your stools. Your body may not be absorbing the medicine. What side effects may I notice from receiving this medicine? Side effects that you should report to your doctor or health care professional as soon as possible: -allergic reactions like skin rash, itching   or hives, swelling of the face, lips, or tongue -fever, chills, or any other sign of infection -painful, difficult, or reduced urination -redness, blistering, peeling or loosening of the skin, including inside the mouth -severe stomach pain -unusual bleeding or bruising -unusually weak or tired -yellowing of the skin or  eyes Side effects that usually do not require medical attention (report to your doctor or health care professional if they continue or are bothersome): -headache -loss of appetite -nausea, vomiting -orange color to the urine -reduced sperm count This list may not describe all possible side effects. Call your doctor for medical advice about side effects. You may report side effects to FDA at 1-800-FDA-1088. Where should I keep my medicine? Keep out of the reach of children. Store at room temperature between 15 and 30 degrees C (59 and 86 degrees F). Keep container tightly closed. Throw away any unused medicine after the expiration date. NOTE: This sheet is a summary. It may not cover all possible information. If you have questions about this medicine, talk to your doctor, pharmacist, or health care provider.  2018 Elsevier/Gold Standard (2008-07-06 13:02:26)  

## 2017-06-30 NOTE — Progress Notes (Signed)
Pharmacy Note  Subjective:  Patient presents today to the [facility] to see Dr. Corliss Skains for follow up of rheumatoid arthritis.  He was prescribed sulfasalazine at the last visit, but patient decided he did not want to take the medication.  Today, patient is interested in learning more about sulfasalazine and will let us know if he decides he wants to take the medication.  Patient seen by pharmacist for counseling on sulfasalazine.    Objective: 01/12/17 CBC normal, CMP creatinine 1.78, GFR 36 G6PD: normal (03/02/17)  Assessment/Plan:   Patient was counseled on the purpose, proper use, and adverse effects of sulfasalazine including risk of infection and chance of nausea, headache, and sun sensitivity.  Also discussed risk of skin rash and advised patient to stop the medication and let us know if she develops a rash. Also discussed for the potential of discoloration of the urine, sweat, or tears.  Reviewed the importance of frequent labs to monitor liver, kidneys, and blood counts; and provided patient with standing lab instructions.  Provided patient with educational materials on sulfasalazine and answered all questions.  Patient signed consent on sulfasalazine on 03/30/17.  Patient will let us know if he wants to initiate the medication.    Lilla Shook, Pharm.D., BCPS, CPP Clinical Pharmacist Pager: [phone] Phone: [phone] 06/30/2017 11:58 AM

## 2017-07-05 DIAGNOSIS — I4891 Unspecified atrial fibrillation: Secondary | ICD-10-CM | POA: Diagnosis not present

## 2017-07-05 DIAGNOSIS — E119 Type 2 diabetes mellitus without complications: Secondary | ICD-10-CM | POA: Diagnosis not present

## 2017-07-06 ENCOUNTER — Telehealth: Payer: Self-pay

## 2017-07-06 NOTE — Telephone Encounter (Signed)
Referred to ICM clinic by Dr Johney Frame.  Call to patient and provided ICM intro.  He agreed to monthly ICM follow up.  1st ICM remote scheduled for 07/29/2017.  Provided ICM number.

## 2017-07-13 ENCOUNTER — Telehealth: Payer: Self-pay

## 2017-07-13 DIAGNOSIS — I10 Essential (primary) hypertension: Secondary | ICD-10-CM | POA: Diagnosis not present

## 2017-07-13 DIAGNOSIS — E1129 Type 2 diabetes mellitus with other diabetic kidney complication: Secondary | ICD-10-CM | POA: Diagnosis not present

## 2017-07-13 DIAGNOSIS — I509 Heart failure, unspecified: Secondary | ICD-10-CM | POA: Diagnosis not present

## 2017-07-13 DIAGNOSIS — M109 Gout, unspecified: Secondary | ICD-10-CM | POA: Diagnosis not present

## 2017-07-13 DIAGNOSIS — N183 Chronic kidney disease, stage 3 (moderate): Secondary | ICD-10-CM | POA: Diagnosis not present

## 2017-07-13 NOTE — Telephone Encounter (Signed)
Patient called to report BP  8/13 morning 111/55 HR 75 8/13 evening 135/62 HR 66 8/14 morning 116/51 HR 59 8/14 evening 135/85 HR 64 8/15 morning 105/51 HR 52 8/15 evening 142/62 HR 65 8/16 morning 126/55 HR 66 8/16 evening 145/64 HR 51 8/17 morning 142/70 HR 53 8/17 evening 171/72 HR 60 8/18 morning 114/50 HR 60 8/18 evening 175/73 HR 62 8/19 evening 137/61 HR 70 8/20 morning 143/62 HR 69 8/20 evening 132/60 HR 62 8/21 morning 144/71 HR 66 8/21 evening 117/59 HR 72 8/22 morning 124/56 HR 58 8/22 evening 152/70 HR 66 8/23 morning124/58 HR 55 8/23 evening 108/47 HR 51 8/24 morning 129/63 HR 57 8/24 evening 152/65 HR 50 8/25 morning 119/57 HR 54 8/25 evening 128/61 HR 66

## 2017-07-14 NOTE — Telephone Encounter (Signed)
Noted, on average blood pressure control is reasonable with some outliers. Would continue with current regimen.

## 2017-07-14 NOTE — Telephone Encounter (Signed)
Patient notified and verbalized understanding. 

## 2017-07-18 ENCOUNTER — Other Ambulatory Visit: Payer: Self-pay | Admitting: Cardiology

## 2017-07-20 DIAGNOSIS — Z713 Dietary counseling and surveillance: Secondary | ICD-10-CM | POA: Diagnosis not present

## 2017-07-20 DIAGNOSIS — I428 Other cardiomyopathies: Secondary | ICD-10-CM | POA: Diagnosis not present

## 2017-07-20 DIAGNOSIS — Z6838 Body mass index (BMI) 38.0-38.9, adult: Secondary | ICD-10-CM | POA: Diagnosis not present

## 2017-07-20 DIAGNOSIS — Z299 Encounter for prophylactic measures, unspecified: Secondary | ICD-10-CM | POA: Diagnosis not present

## 2017-07-20 DIAGNOSIS — I4891 Unspecified atrial fibrillation: Secondary | ICD-10-CM | POA: Diagnosis not present

## 2017-07-26 DIAGNOSIS — M179 Osteoarthritis of knee, unspecified: Secondary | ICD-10-CM | POA: Diagnosis not present

## 2017-07-26 DIAGNOSIS — M533 Sacrococcygeal disorders, not elsewhere classified: Secondary | ICD-10-CM | POA: Diagnosis not present

## 2017-07-26 DIAGNOSIS — M791 Myalgia: Secondary | ICD-10-CM | POA: Diagnosis not present

## 2017-07-26 DIAGNOSIS — E1140 Type 2 diabetes mellitus with diabetic neuropathy, unspecified: Secondary | ICD-10-CM | POA: Diagnosis not present

## 2017-07-26 DIAGNOSIS — M5416 Radiculopathy, lumbar region: Secondary | ICD-10-CM | POA: Diagnosis not present

## 2017-07-26 DIAGNOSIS — M47817 Spondylosis without myelopathy or radiculopathy, lumbosacral region: Secondary | ICD-10-CM | POA: Diagnosis not present

## 2017-07-26 DIAGNOSIS — M1990 Unspecified osteoarthritis, unspecified site: Secondary | ICD-10-CM | POA: Diagnosis not present

## 2017-07-26 DIAGNOSIS — Z5181 Encounter for therapeutic drug level monitoring: Secondary | ICD-10-CM | POA: Diagnosis not present

## 2017-07-29 ENCOUNTER — Ambulatory Visit (INDEPENDENT_AMBULATORY_CARE_PROVIDER_SITE_OTHER): Payer: PPO

## 2017-07-29 DIAGNOSIS — Z95810 Presence of automatic (implantable) cardiac defibrillator: Secondary | ICD-10-CM

## 2017-07-29 DIAGNOSIS — I255 Ischemic cardiomyopathy: Secondary | ICD-10-CM

## 2017-07-30 ENCOUNTER — Telehealth: Payer: Self-pay

## 2017-07-30 NOTE — Telephone Encounter (Signed)
Patient called back asking if he should send a transmission today and advised to send for review.

## 2017-07-30 NOTE — Telephone Encounter (Signed)
Remote ICM transmission received.  Attempted call to patient and left detailed message regarding transmission and next ICM scheduled for 08/31/2017.  Advised to return call for any fluid symptoms or questions.    

## 2017-07-30 NOTE — Progress Notes (Signed)
EPIC Encounter for ICM Monitoring  Patient Name: [name] is a 76 y.o. male Date: 07/30/2017 Primary Care Physican: [name], MD Primary Cardiologist: Diona Browner Electrophysiologist: Allred Dry Weight: Last office visit 255 lbs  Bi-V Pacing:   98%       1st ICM remote transmission.  Attempted call to patient and unable to reach.  Left detailed message regarding transmission.  Transmission reviewed.    Thoracic impedance normal today but has been abnormal suggesting fluid accumulation for numerous days in the last month.  Prescribed dosage: Torsemide 10 mEq 1 tablet twice a day  Recommendations: Left voice mail with ICM number and encouraged to call for fluid symptoms.  Follow-up plan: ICM clinic phone appointment on 08/31/2017.    Copy of ICM check sent to Dr. Johney Frame.   3 month ICM trend: 07/30/2017   1 Year ICM trend:      Karie Soda, RN 07/30/2017 2:15 PM

## 2017-07-30 NOTE — Telephone Encounter (Signed)
Attempted call to patient and left message requesting he send ICM remote transmission.

## 2017-08-04 ENCOUNTER — Ambulatory Visit (INDEPENDENT_AMBULATORY_CARE_PROVIDER_SITE_OTHER): Payer: PPO | Admitting: Podiatry

## 2017-08-04 ENCOUNTER — Encounter: Payer: Self-pay | Admitting: Neurology

## 2017-08-04 ENCOUNTER — Ambulatory Visit (INDEPENDENT_AMBULATORY_CARE_PROVIDER_SITE_OTHER): Payer: PPO | Admitting: Neurology

## 2017-08-04 VITALS — BP 137/67 | HR 57 | Ht 68.0 in | Wt 253.0 lb

## 2017-08-04 DIAGNOSIS — T148XXA Other injury of unspecified body region, initial encounter: Secondary | ICD-10-CM

## 2017-08-04 DIAGNOSIS — Z8673 Personal history of transient ischemic attack (TIA), and cerebral infarction without residual deficits: Secondary | ICD-10-CM

## 2017-08-04 DIAGNOSIS — L089 Local infection of the skin and subcutaneous tissue, unspecified: Secondary | ICD-10-CM

## 2017-08-04 DIAGNOSIS — G514 Facial myokymia: Secondary | ICD-10-CM

## 2017-08-04 MED ORDER — DOXYCYCLINE HYCLATE 100 MG PO TABS: 100.0000 mg | ORAL_TABLET | Freq: Two times a day (BID) | ORAL | 0 refills | Status: DC

## 2017-08-04 NOTE — Progress Notes (Signed)
Subjective:    Patient ID: [name] is a 76 y.o. male.  HPI      Tyler Foley, MD, PhD [facility] [address] [address] [place], [place] [address]  Dear Dr. Sherryll Burger,   I saw your patient, [name], upon your kind request in my neurologic clinic today for initial consultation of his facial twitching. Patient is unaccompanied today. As you know, [name] is a 76 year old right-handed gentleman with an underlying complex medical history of hyperlipidemia, history of B12 deficiency, arthritis, s/p b/l TKA, hypertension, history of atrial fibrillation, Obesity, status post ICD placement, who reports intermittent facial twitching. I reviewed your office note from 06/17/2017, which you kindly included. He was advised to stop his B12 at the time as his level was high. He has not had any facial twitching the past 6 weeks or so. Feels at baseline at this time. He was previously told he had a TIA some 20 years ago.  He used to see Dr. Sandria [name] in the past. He has a history of a intracranial/brain aneurysm. He had prior head MRAs, last one in 2012 as I can tell. Reports are not available for my review today with the exception of one report from April 2011 which showed a small 2011, 2 x 4 mm bilobed aneurysm at the vertebrobasilar junction. Also intracranial stenoses were seen. He reports that the aneuryms was stable. Never saw a neurosurgeon. He has a history of recurrent R sided numbness in the past, some 20 years ago. He has never had a sleep study. He does endorse snoring but does not wish to have a sleep study at this time. He had his original ICD placed probably shortly after his last MRA in 2012 and recent replacement in May 2018.  His Past Medical History Is Significant For: Past Medical History:  Diagnosis Date  . Anemia   . Anemia-chronic    a. Pt reports h/o anemia - was offered IV iron in past by nephrologist but declined and has taken PO instead.  .  Aneurysm, cerebral   . BPH (benign prostatic hypertrophy)   . Cardiomyopathy, ischemic    LVEF 25-35%.  . Chronic kidney disease, stage III (moderate)    Followed by Dr. Fausto Skillern.  . Coronary atherosclerosis of native coronary artery    a. DES to RCA 03/2009. b. s/p PTCA to RCA for ISR, 05/2010. c. 03/2013 NSTEMI 2/2 severe prox RCA s/p PTCA/DES, med rx for residual dz.  . Essential hypertension, benign   . Hyperlipidemia   . LBBB (left bundle branch block)    s/p BiV ICD Implanted by Dr Graciela Husbands  . Morbid obesity (HCC)   . MRSA (methicillin resistant Staphylococcus aureus)   . Myocardial infarction (HCC)   . Paroxysmal atrial fibrillation (HCC)    a. Coumadin discontinue in 2010 when the patient required ASA/Plavix for stenting. b. Coumadin restarted 05/2013, Plavix continued, ASA stopped.   . Pulmonary nodule    CXR, 03/2013, [facility] - not described on any subsequent chest x-rays however, could be a vascular structure  . TIA (transient ischemic attack)    Multiple TIAs  . Type 2 diabetes mellitus (HCC)     His Past Surgical History Is Significant For: Past Surgical History:  Procedure Laterality Date  . BIV ICD GENERATOR CHANGEOUT N/A 03/18/2017   Medtronic Claria MRI conditional CRT D device implated by Dr Johney Frame  . CHOLECYSTECTOMY    . ICD placement  05/06/2011   Medtronic CRT-D  .  LEFT HEART CATHETERIZATION WITH CORONARY ANGIOGRAM N/A 04/17/2013   Procedure: LEFT HEART CATHETERIZATION WITH CORONARY ANGIOGRAM;  Surgeon: Vesta Mixer, MD;  Location[facility] C[facility] LAB;  Service: Cardiovascular;  Laterality: N/A;  . LUNG SURGERY     23 - 24 yrs. old  . TOTAL KNEE ARTHROPLASTY    . VASECTOMY      His Family History Is Significant For: Family History  Problem Relation Age of Onset  . Diabetes Mother        Bilateral leg amputation  . Heart disease Mother        Before age 87  . Hypertension Mother   . Cancer Father        Prostate and Bone  . Diabetes Father   . Cancer Brother         Prostate  . Heart disease Other        family h/o premature cardiovascular disease  . Cancer Sister        Colon   . Hypertension Sister     His Social History Is Significant For: Social History   Social History  . Marital status: Married    Spouse name: N/A  . Number of children: N/A  . Years of education: N/A   Social History Main Topics  . Smoking status: Former Smoker    Packs/day: 1.00    Years: 6.00    Types: Cigarettes    Start date: 12/08/1949    Quit date: 11/16/1964  . Smokeless tobacco: Former Neurosurgeon    Quit date: 04/15/1965  . Alcohol use 0.0 oz/week     Comment: once/month  . Drug use: No  . Sexual activity: Not Asked   Other Topics Concern  . None   Social History Narrative  . None    His Allergies Are:  Allergies  Allergen Reactions  . Allegra [Fexofenadine]   . Biaxin [Clarithromycin]   . Crestor [Rosuvastatin Calcium]   . Glucophage [Metformin Hcl]   . Trimethoprim   . Codeine Other (See Comments)    blisters, but able to take hydrocodone  :   His Current Medications Are:  Outpatient Encounter Prescriptions as of 08/04/2017  Medication Sig  . allopurinol (ZYLOPRIM) 100 MG tablet Take 100 mg by mouth daily.  . carvedilol (COREG) 12.5 MG tablet TAKE 1 AND 1/2 TABLETS BY MOUTH TWICE DAILY WITH MEALS (DUE FOR FOLLOW UP)  . clopidogrel (PLAVIX) 75 MG tablet Take 1 tablet (75 mg total) by mouth daily.  . hydrALAZINE (APRESOLINE) 25 MG tablet TAKE ONE TABLET BY MOUTH THREE TIMES DAILY  . HYDROcodone-acetaminophen (NORCO) 7.5-325 MG tablet Take 1 tablet by mouth 4 (four) times daily as needed for moderate pain.  Marland Kitchen insulin lispro protamine-insulin lispro (HUMALOG 75/25) (75-25) 100 UNIT/ML SUSP Inject 22-35 Units into the skin See admin instructions. Takes 35 units in the morning with breakfast and 35 units at supper (Will take 22-25 at lunch time occasionally)  . isosorbide mononitrate (IMDUR) 30 MG 24 hr tablet TAKE ONE TABLET BY MOUTH ONCE DAILY  .  levothyroxine (SYNTHROID, LEVOTHROID) 50 MCG tablet Take 50 mcg by mouth daily.  Marland Kitchen lisinopril (PRINIVIL,ZESTRIL) 2.5 MG tablet Take 2.5 mg by mouth daily.  Marland Kitchen loratadine (CLARITIN) 10 MG tablet Take 10 mg by mouth daily.  . nitroGLYCERIN (NITROSTAT) 0.4 MG SL tablet Place 0.4 mg under the tongue every 5 (five) minutes as needed for chest pain. For chest pain  . sulfaSALAzine (AZULFIDINE EN-TABS) 500 MG EC tablet Take 1 tablet (500  mg total) by mouth 2 (two) times daily.  . tamsulosin (FLOMAX) 0.4 MG CAPS capsule Take 0.4 mg by mouth.  . torsemide (DEMADEX) 10 MG tablet Take 1 tablet (10 mg total) by mouth 2 (two) times daily.  Marland Kitchen warfarin (COUMADIN) 5 MG tablet Take 0.5-1 tablets (2.5-5 mg total) by mouth daily at 6 PM. Takes 2.5mg  on Sunday, Monday, Wednesday and Friday  Takes 5mg  on Tuesday, Thursday and Sunday  . Zinc 50 MG CAPS Take 50 mg by mouth daily.   . [DISCONTINUED] L-Methylfolate-Algae-B12-B6 (METANX) 3-90.314-2-35 MG CAPS TAKE ONE CAPSULE BY MOUTH ONCE DAILY  . [DISCONTINUED] levothyroxine (SYNTHROID, LEVOTHROID) 25 MCG tablet Take 1 tablet by mouth daily.  . [DISCONTINUED] Omega-3 Fatty Acids (FISH OIL) 1000 MG CAPS Take 1,000 mg by mouth daily.   No facility-administered encounter medications on file as of 08/04/2017.   : Review of Systems:  Out of a complete 14 point review of systems, all are reviewed and negative with the exception of these symptoms as listed below:  Review of Systems  Neurological:       Patient reports having TIAs in the past and would like to follow up. He reports that he starting having twitching again but was advised to stop his b12 and has not had symptoms in about 6 weeks.    Objective:  Neurological Exam  Physical Exam Physical Examination:   Vitals:   08/04/17 1007  BP: 137/67  Pulse: (!) 57    General Examination: The patient is a very pleasant 76 y.o. male in no acute distress. He appears well-developed and well-nourished and well  groomed.   HEENT: Normocephalic, atraumatic, pupils are equal, round and reactive to light and accommodation. He has corrective eye glasses. Extraocular tracking is good without limitation to gaze excursion or nystagmus noted. Normal smooth pursuit is noted. Hearing is grossly intact. Face is symmetric with normal facial animation and normal facial sensation. Speech is clear with no dysarthria noted. There is no hypophonia. There is no lip, neck/head, jaw or voice tremor. Neck is supple with full range of passive and active motion. There are no carotid bruits on auscultation. Oropharynx exam reveals: mild mouth dryness, adequate dental hygiene and moderate airway crowding. Mallampati is class III. Tongue protrudes centrally and palate elevates symmetrically. Tonsils are absent. Neck size is 20.5 inches.   Chest: Clear to auscultation without wheezing, rhonchi or crackles noted.  Heart: S1+S2+0, regular and normal without murmurs, rubs or gallops noted.   Abdomen: Soft, non-tender and non-distended with normal bowel sounds appreciated on auscultation.  Extremities: There is no pitting edema in the distal lower extremities bilaterally. Pedal pulses are intact.  Skin: Warm and quite dry appearing with chronic changes noted, in keeping with chronic sun exposure, mild bruising, of note he is on Coumadin and Plavix.   Musculoskeletal: exam reveals no obvious joint deformities, tenderness or joint swelling or erythema. Arthritic changes in his hands.  Neurologically:  Mental status: The patient is awake, alert and oriented in all 4 spheres. His immediate and remote memory, attention, language skills and fund of knowledge are appropriate. There is no evidence of aphasia, agnosia, apraxia or anomia. Speech is clear with normal prosody and enunciation. Thought process is linear. Mood is normal and affect is normal.  Cranial nerves II - XII are as described above under HEENT exam. In addition: shoulder shrug  is normal with equal shoulder height noted. Motor exam: Normal bulk, strength and tone is noted. There is no drift, tremor  or rebound. Romberg is not tested for safety. Reflexes are 1 in the upper extremities and absent in the lower extremities. Fine motor skills are globally intact in the upper and lower extremities, some limitation in range of motion in his knees. He is status post bilateral knee replacements. Sensory exam is intact to light touch but decreased in the distal lower extremities.  Cerebellar testing: No dysmetria or intention tremor on finger to nose testing. Heel to shin is somewhat a little limited.  Gait, station and balance: he stands up with mild difficulty. No limp, preserved arm swing with walking, slightly wide-based.   Assessment and Plan:   In summary, [name] is a very pleasant 76 y.o.-year old male with an underlying complex medical history of hyperlipidemia, history of B12 deficiency, arthritis, s/p b/l TKA, hypertension, history of atrial fibrillation, Obesity, status post ICD placement, whoPresents for neurological consultation of intermittent facial twitching and prior diagnosis of small brain aneurysm and TIA in the distant past. His exam is nonfocal at this time with the exception of known evidence of peripheral neuropathy, likely diabetic neuropathy. His latest A1c per his report was 7.5, slightly suboptimal. He has risk factors for sleep apnea based on his history of snoring, larger neck circumference and airway crowding but does not wish to proceed with a sleep study. We talked about TIA secondary prevention and stroke secondary prevention and healthy lifestyle quite a bit. He is reassured that his exam is not sinister at this time. He in fact had no further twitching after he stopped taking his B12 supplement as I understand. He feels at baseline. He does have quite a bit of risk factors for vascular disease in general. He is advised to continue to pursue healthy  lifestyle and risk factor management through primary care. I suggested as needed follow-up. I answered all his questions today and he was in agreement. Should he change his mind about pursuing a sleep study I would be happy to order this for him.  Thank you very much for allowing me to participate in the care of this nice patient. If I can be of any further assistance to you please do not hesitate to call me at [phone].  Sincerely,   Tyler Foley, MD, PhD

## 2017-08-04 NOTE — Progress Notes (Signed)
This patient returns to the office for continued evaluation of the skin lesion under the big toe joint of the right foot. Patient was initially seen in April 2018.  He had the same problem at that visit.  He says that he has had continual pain and discomfort at this area, especially driving his  Car. He says that he was probing this area himself days ago and there was drainage and pus that came out from under the big toe joint, right foot.  He now presents the office and he desires an evaluation of this painful skin lesion.  He requests an injection to help to relieve the pain.   GENERAL APPEARANCE: Alert, conversant. Appropriately groomed. No acute distress.  VASCULAR: Pedal pulses are  palpable at  [facility] and PT bilateral.  Capillary refill time is immediate to all digits,  Normal temperature gradient.  Digital hair growth is present bilateral  NEUROLOGIC: sensation is normal to 5.07 monofilament at 5/5 sites bilateral.  Light touch is intact bilateral, Muscle strength normal.  MUSCULOSKELETAL: acceptable muscle strength, tone and stability bilateral.  Intrinsic muscluature intact bilateral.  Rectus appearance of foot and digits noted bilateral.   DERMATOLOGIC: skin color, texture, and turgor are within normal limits except under the first MPJ right foot.    Digital nails are asymptomatic. No drainage noted. There is a pinhole ulcer noted under the tibial sesamoid of the right foot.  No evidence of any redness, swelling or fluctuance noted.  Palpable pain noted to the tibial sesamoid of the right foot.  Infected ulcer/blister right foot  Return office visit evaluation of his condition does reveal a small ulcerated skin lesion which he caused when he probed this area and removed drainage.  There is no evidence of any pus or infection noted.  I informed the patient. There might be a localized infection. Therefore, I would not provide him with an injection of cortisone to relieve his pain.  Instead, I applied  dispersion pad and gave him paddings for home.  I prescribed doxycycline for this patient and needed to override his allergies since he had previously taken the medicine with no interactions.  RTC 7-10 days for evaluation.   Helane Gunther DPM

## 2017-08-04 NOTE — Patient Instructions (Addendum)
.    Thank you for choosing [facility] for your neurological care! It was nice to meet you today! I appreciate that you entrust me with your sleep related healthcare concerns. I hope, I was able to address at least some of your concerns today, and that I can help you feel reassured and also get better.    Here is what we discussed today and what we came up with as our plan for you:   Your exam does not suggest any significant neurological findings, other than known neuropathy.   However, based on your symptoms and your exam I believe you are at risk for obstructive sleep apnea or OSA, and I think you should think about having a sleep study to determine whether you do or do not have OSA.   As you know, you cannot have an MRI since your defibrillator placement.   I can see you back as needed. Continue exercising regularly and take your medications as directed. As discussed, secondary prevention is key after a diagnosis of TIA (in the past). This means: taking care of blood sugar values or diabetes management (A1c goal of less than 7.0), good blood pressure (hypertension) control and optimizing cholesterol management (with LDL goal of less than 70), exercising daily or regularly within your own mobility limitations of course, and overall cardiovascular risk factor reduction, which includes screening for and treatment of obstructive sleep apnea (OSA) and weight management.

## 2017-08-12 DIAGNOSIS — E119 Type 2 diabetes mellitus without complications: Secondary | ICD-10-CM | POA: Diagnosis not present

## 2017-08-12 DIAGNOSIS — I4891 Unspecified atrial fibrillation: Secondary | ICD-10-CM | POA: Diagnosis not present

## 2017-08-18 ENCOUNTER — Encounter: Payer: Self-pay | Admitting: Podiatry

## 2017-08-18 ENCOUNTER — Ambulatory Visit (INDEPENDENT_AMBULATORY_CARE_PROVIDER_SITE_OTHER): Payer: PPO | Admitting: Podiatry

## 2017-08-18 VITALS — BP 149/87 | HR 59

## 2017-08-18 DIAGNOSIS — T148XXA Other injury of unspecified body region, initial encounter: Secondary | ICD-10-CM

## 2017-08-18 DIAGNOSIS — E119 Type 2 diabetes mellitus without complications: Secondary | ICD-10-CM | POA: Diagnosis not present

## 2017-08-18 DIAGNOSIS — L84 Corns and callosities: Secondary | ICD-10-CM | POA: Diagnosis not present

## 2017-08-18 DIAGNOSIS — L089 Local infection of the skin and subcutaneous tissue, unspecified: Secondary | ICD-10-CM | POA: Diagnosis not present

## 2017-08-18 NOTE — Progress Notes (Signed)
This patient returns to the office for continued evaluation of the skin lesion under the big toe joint of the right foot.  This patient was diagnosed as having an infected ulcer/blister bottom the big toe joint, right foot. He says he has been bandaging the site and it has no drainage from the ulcer site. Patient is also taking doxycycline for any infection.  Patient is pleased with his improvement.  GENERAL APPEARANCE: Alert, conve rsant. Appropriately groomed. No acute distress.  VASCULAR: Pedal pulses are  palpable at  [facility] and PT bilateral.  Capillary refill time is immediate to all digits,  Normal temperature gradient.  Digital hair growth is present bilateral  NEUROLOGIC: sensation is normal to 5.07 monofilament at 5/5 sites bilateral.  Light touch is intact bilateral, Muscle strength normal.  MUSCULOSKELETAL: acceptable muscle strength, tone and stability bilateral.  Intrinsic muscluature intact bilateral.  Rectus appearance of foot and digits noted bilateral.   DERMATOLOGIC: skin color, texture, and turgor are within normal limits except under the first MPJ right foot.    Digital nails are asymptomatic. No drainage noted. There is a closed  ulcer noted under the tibial sesamoid of the right foot.  No evidence of any redness, swelling or fluctuance noted.  Palpable pain noted to the tibial sesamoid of the right foot.  Infected ulcer/blister right foot  Return office visit evaluation of his condition . Reveals the ulcer has closed and no drainage is noted.  Dispersion padding was recommended for him to apply to his footgear at home. In order to off weight bear this painful ulcer/callus site.  Return to clinic when necessary   Helane Gunther DPM

## 2017-08-19 DIAGNOSIS — I428 Other cardiomyopathies: Secondary | ICD-10-CM | POA: Diagnosis not present

## 2017-08-19 DIAGNOSIS — E1122 Type 2 diabetes mellitus with diabetic chronic kidney disease: Secondary | ICD-10-CM | POA: Diagnosis not present

## 2017-08-19 DIAGNOSIS — E039 Hypothyroidism, unspecified: Secondary | ICD-10-CM | POA: Diagnosis not present

## 2017-08-19 DIAGNOSIS — E785 Hyperlipidemia, unspecified: Secondary | ICD-10-CM | POA: Diagnosis not present

## 2017-08-19 DIAGNOSIS — Z299 Encounter for prophylactic measures, unspecified: Secondary | ICD-10-CM | POA: Diagnosis not present

## 2017-08-19 DIAGNOSIS — R5383 Other fatigue: Secondary | ICD-10-CM | POA: Diagnosis not present

## 2017-08-19 DIAGNOSIS — I4891 Unspecified atrial fibrillation: Secondary | ICD-10-CM | POA: Diagnosis not present

## 2017-08-19 DIAGNOSIS — E669 Obesity, unspecified: Secondary | ICD-10-CM | POA: Diagnosis not present

## 2017-08-19 DIAGNOSIS — N183 Chronic kidney disease, stage 3 (moderate): Secondary | ICD-10-CM | POA: Diagnosis not present

## 2017-08-19 DIAGNOSIS — I10 Essential (primary) hypertension: Secondary | ICD-10-CM | POA: Diagnosis not present

## 2017-08-19 DIAGNOSIS — N400 Enlarged prostate without lower urinary tract symptoms: Secondary | ICD-10-CM | POA: Diagnosis not present

## 2017-08-23 DIAGNOSIS — M47817 Spondylosis without myelopathy or radiculopathy, lumbosacral region: Secondary | ICD-10-CM | POA: Diagnosis not present

## 2017-08-23 DIAGNOSIS — G894 Chronic pain syndrome: Secondary | ICD-10-CM | POA: Diagnosis not present

## 2017-08-23 DIAGNOSIS — M5416 Radiculopathy, lumbar region: Secondary | ICD-10-CM | POA: Diagnosis not present

## 2017-08-23 DIAGNOSIS — Z79891 Long term (current) use of opiate analgesic: Secondary | ICD-10-CM | POA: Diagnosis not present

## 2017-08-31 ENCOUNTER — Ambulatory Visit (INDEPENDENT_AMBULATORY_CARE_PROVIDER_SITE_OTHER): Payer: PPO

## 2017-08-31 ENCOUNTER — Telehealth: Payer: Self-pay

## 2017-08-31 DIAGNOSIS — Z95810 Presence of automatic (implantable) cardiac defibrillator: Secondary | ICD-10-CM | POA: Diagnosis not present

## 2017-08-31 DIAGNOSIS — I5022 Chronic systolic (congestive) heart failure: Secondary | ICD-10-CM | POA: Diagnosis not present

## 2017-08-31 NOTE — Telephone Encounter (Signed)
Remote ICM transmission received.  Attempted call to patient and left detailed message per DPR regarding transmission and next ICM scheduled for 10/04/2017.  Advised to return call for any fluid symptoms or questions.    

## 2017-08-31 NOTE — Progress Notes (Signed)
EPIC Encounter for ICM Monitoring  Patient Name: [name] is a 76 y.o. male Date: 08/31/2017 Primary Care Physican: [name], MD Primary Cardiologist: Diona Browner Electrophysiologist: Allred Dry Weight:    Last office visit 255 lbs  Bi-V Pacing:   97.9%           Attempted call to patient and unable to reach.  Left detailed message regarding transmission.  Transmission reviewed.    Thoracic impedance normal.  Prescribed dosage: Torsemide 10 mEq 1 tablet twice a day  Recommendations: Left voice mail with ICM number and encouraged to call if experiencing any fluid symptoms.  Follow-up plan: ICM clinic phone appointment on 10/04/2017.    Copy of ICM check sent to Dr. Johney Frame.   3 month ICM trend: 08/31/2017   1 Year ICM trend:      Karie Soda, RN 08/31/2017 9:36 AM

## 2017-09-01 DIAGNOSIS — E119 Type 2 diabetes mellitus without complications: Secondary | ICD-10-CM | POA: Diagnosis not present

## 2017-09-01 DIAGNOSIS — I4891 Unspecified atrial fibrillation: Secondary | ICD-10-CM | POA: Diagnosis not present

## 2017-09-02 DIAGNOSIS — Z299 Encounter for prophylactic measures, unspecified: Secondary | ICD-10-CM | POA: Diagnosis not present

## 2017-09-02 DIAGNOSIS — N183 Chronic kidney disease, stage 3 (moderate): Secondary | ICD-10-CM | POA: Diagnosis not present

## 2017-09-02 DIAGNOSIS — E669 Obesity, unspecified: Secondary | ICD-10-CM | POA: Diagnosis not present

## 2017-09-02 DIAGNOSIS — I428 Other cardiomyopathies: Secondary | ICD-10-CM | POA: Diagnosis not present

## 2017-09-02 DIAGNOSIS — I4891 Unspecified atrial fibrillation: Secondary | ICD-10-CM | POA: Diagnosis not present

## 2017-09-02 DIAGNOSIS — E1122 Type 2 diabetes mellitus with diabetic chronic kidney disease: Secondary | ICD-10-CM | POA: Diagnosis not present

## 2017-09-07 DIAGNOSIS — E1165 Type 2 diabetes mellitus with hyperglycemia: Secondary | ICD-10-CM | POA: Diagnosis not present

## 2017-09-07 DIAGNOSIS — N183 Chronic kidney disease, stage 3 (moderate): Secondary | ICD-10-CM | POA: Diagnosis not present

## 2017-09-07 DIAGNOSIS — I10 Essential (primary) hypertension: Secondary | ICD-10-CM | POA: Diagnosis not present

## 2017-09-07 DIAGNOSIS — Z79899 Other long term (current) drug therapy: Secondary | ICD-10-CM | POA: Diagnosis not present

## 2017-09-07 DIAGNOSIS — Z0000 Encounter for general adult medical examination without abnormal findings: Secondary | ICD-10-CM | POA: Diagnosis not present

## 2017-09-07 DIAGNOSIS — E039 Hypothyroidism, unspecified: Secondary | ICD-10-CM | POA: Diagnosis not present

## 2017-09-07 DIAGNOSIS — R5383 Other fatigue: Secondary | ICD-10-CM | POA: Diagnosis not present

## 2017-09-07 DIAGNOSIS — Z6839 Body mass index (BMI) 39.0-39.9, adult: Secondary | ICD-10-CM | POA: Diagnosis not present

## 2017-09-07 DIAGNOSIS — E1122 Type 2 diabetes mellitus with diabetic chronic kidney disease: Secondary | ICD-10-CM | POA: Diagnosis not present

## 2017-09-07 DIAGNOSIS — Z7189 Other specified counseling: Secondary | ICD-10-CM | POA: Diagnosis not present

## 2017-09-07 DIAGNOSIS — Z1331 Encounter for screening for depression: Secondary | ICD-10-CM | POA: Diagnosis not present

## 2017-09-07 DIAGNOSIS — I4891 Unspecified atrial fibrillation: Secondary | ICD-10-CM | POA: Diagnosis not present

## 2017-09-07 DIAGNOSIS — Z125 Encounter for screening for malignant neoplasm of prostate: Secondary | ICD-10-CM | POA: Diagnosis not present

## 2017-09-07 DIAGNOSIS — Z299 Encounter for prophylactic measures, unspecified: Secondary | ICD-10-CM | POA: Diagnosis not present

## 2017-09-07 DIAGNOSIS — Z1339 Encounter for screening examination for other mental health and behavioral disorders: Secondary | ICD-10-CM | POA: Diagnosis not present

## 2017-09-14 ENCOUNTER — Other Ambulatory Visit: Payer: Self-pay | Admitting: Cardiology

## 2017-09-20 DIAGNOSIS — E119 Type 2 diabetes mellitus without complications: Secondary | ICD-10-CM | POA: Diagnosis not present

## 2017-09-20 DIAGNOSIS — I4891 Unspecified atrial fibrillation: Secondary | ICD-10-CM | POA: Diagnosis not present

## 2017-09-21 DIAGNOSIS — M1990 Unspecified osteoarthritis, unspecified site: Secondary | ICD-10-CM | POA: Diagnosis not present

## 2017-09-21 DIAGNOSIS — Z5181 Encounter for therapeutic drug level monitoring: Secondary | ICD-10-CM | POA: Diagnosis not present

## 2017-09-21 DIAGNOSIS — M179 Osteoarthritis of knee, unspecified: Secondary | ICD-10-CM | POA: Diagnosis not present

## 2017-09-21 DIAGNOSIS — E1140 Type 2 diabetes mellitus with diabetic neuropathy, unspecified: Secondary | ICD-10-CM | POA: Diagnosis not present

## 2017-09-24 ENCOUNTER — Encounter (HOSPITAL_COMMUNITY): Payer: Self-pay | Admitting: Emergency Medicine

## 2017-09-24 ENCOUNTER — Other Ambulatory Visit: Payer: Self-pay

## 2017-09-24 ENCOUNTER — Emergency Department (HOSPITAL_COMMUNITY)
Admission: EM | Admit: 2017-09-24 | Discharge: 2017-09-24 | Disposition: A | Discharge status: Home / Self Care | Payer: PPO | Source: Home / Self Care | Attending: Emergency Medicine | Admitting: Emergency Medicine

## 2017-09-24 ENCOUNTER — Emergency Department (HOSPITAL_COMMUNITY): Payer: PPO

## 2017-09-24 DIAGNOSIS — Z95810 Presence of automatic (implantable) cardiac defibrillator: Secondary | ICD-10-CM | POA: Diagnosis not present

## 2017-09-24 DIAGNOSIS — I130 Hypertensive heart and chronic kidney disease with heart failure and stage 1 through stage 4 chronic kidney disease, or unspecified chronic kidney disease: Secondary | ICD-10-CM

## 2017-09-24 DIAGNOSIS — N183 Chronic kidney disease, stage 3 (moderate): Secondary | ICD-10-CM

## 2017-09-24 DIAGNOSIS — Z6841 Body Mass Index (BMI) 40.0 and over, adult: Secondary | ICD-10-CM | POA: Diagnosis not present

## 2017-09-24 DIAGNOSIS — E1122 Type 2 diabetes mellitus with diabetic chronic kidney disease: Secondary | ICD-10-CM | POA: Diagnosis present

## 2017-09-24 DIAGNOSIS — E11628 Type 2 diabetes mellitus with other skin complications: Secondary | ICD-10-CM | POA: Diagnosis not present

## 2017-09-24 DIAGNOSIS — E785 Hyperlipidemia, unspecified: Secondary | ICD-10-CM | POA: Diagnosis present

## 2017-09-24 DIAGNOSIS — I4891 Unspecified atrial fibrillation: Secondary | ICD-10-CM | POA: Diagnosis not present

## 2017-09-24 DIAGNOSIS — R578 Other shock: Secondary | ICD-10-CM | POA: Diagnosis not present

## 2017-09-24 DIAGNOSIS — N400 Enlarged prostate without lower urinary tract symptoms: Secondary | ICD-10-CM | POA: Diagnosis not present

## 2017-09-24 DIAGNOSIS — L089 Local infection of the skin and subcutaneous tissue, unspecified: Secondary | ICD-10-CM | POA: Diagnosis not present

## 2017-09-24 DIAGNOSIS — E869 Volume depletion, unspecified: Secondary | ICD-10-CM | POA: Diagnosis present

## 2017-09-24 DIAGNOSIS — Z79899 Other long term (current) drug therapy: Secondary | ICD-10-CM | POA: Insufficient documentation

## 2017-09-24 DIAGNOSIS — Z299 Encounter for prophylactic measures, unspecified: Secondary | ICD-10-CM | POA: Diagnosis not present

## 2017-09-24 DIAGNOSIS — M069 Rheumatoid arthritis, unspecified: Secondary | ICD-10-CM | POA: Diagnosis present

## 2017-09-24 DIAGNOSIS — K922 Gastrointestinal hemorrhage, unspecified: Secondary | ICD-10-CM | POA: Diagnosis not present

## 2017-09-24 DIAGNOSIS — E872 Acidosis: Secondary | ICD-10-CM | POA: Diagnosis not present

## 2017-09-24 DIAGNOSIS — I739 Peripheral vascular disease, unspecified: Secondary | ICD-10-CM | POA: Diagnosis not present

## 2017-09-24 DIAGNOSIS — L03119 Cellulitis of unspecified part of limb: Secondary | ICD-10-CM | POA: Diagnosis not present

## 2017-09-24 DIAGNOSIS — I129 Hypertensive chronic kidney disease with stage 1 through stage 4 chronic kidney disease, or unspecified chronic kidney disease: Secondary | ICD-10-CM | POA: Diagnosis not present

## 2017-09-24 DIAGNOSIS — J9690 Respiratory failure, unspecified, unspecified whether with hypoxia or hypercapnia: Secondary | ICD-10-CM | POA: Diagnosis not present

## 2017-09-24 DIAGNOSIS — R609 Edema, unspecified: Secondary | ICD-10-CM | POA: Diagnosis not present

## 2017-09-24 DIAGNOSIS — Z8673 Personal history of transient ischemic attack (TIA), and cerebral infarction without residual deficits: Secondary | ICD-10-CM | POA: Insufficient documentation

## 2017-09-24 DIAGNOSIS — R0602 Shortness of breath: Secondary | ICD-10-CM | POA: Diagnosis not present

## 2017-09-24 DIAGNOSIS — E1169 Type 2 diabetes mellitus with other specified complication: Secondary | ICD-10-CM | POA: Diagnosis not present

## 2017-09-24 DIAGNOSIS — L03116 Cellulitis of left lower limb: Secondary | ICD-10-CM | POA: Diagnosis not present

## 2017-09-24 DIAGNOSIS — K920 Hematemesis: Secondary | ICD-10-CM | POA: Diagnosis not present

## 2017-09-24 DIAGNOSIS — E11621 Type 2 diabetes mellitus with foot ulcer: Secondary | ICD-10-CM | POA: Diagnosis present

## 2017-09-24 DIAGNOSIS — I2510 Atherosclerotic heart disease of native coronary artery without angina pectoris: Secondary | ICD-10-CM

## 2017-09-24 DIAGNOSIS — Z9861 Coronary angioplasty status: Secondary | ICD-10-CM | POA: Diagnosis not present

## 2017-09-24 DIAGNOSIS — I252 Old myocardial infarction: Secondary | ICD-10-CM

## 2017-09-24 DIAGNOSIS — Z23 Encounter for immunization: Secondary | ICD-10-CM

## 2017-09-24 DIAGNOSIS — M109 Gout, unspecified: Secondary | ICD-10-CM | POA: Diagnosis not present

## 2017-09-24 DIAGNOSIS — B999 Unspecified infectious disease: Secondary | ICD-10-CM | POA: Diagnosis not present

## 2017-09-24 DIAGNOSIS — Z794 Long term (current) use of insulin: Secondary | ICD-10-CM

## 2017-09-24 DIAGNOSIS — Z7901 Long term (current) use of anticoagulants: Secondary | ICD-10-CM | POA: Diagnosis not present

## 2017-09-24 DIAGNOSIS — J9600 Acute respiratory failure, unspecified whether with hypoxia or hypercapnia: Secondary | ICD-10-CM | POA: Diagnosis not present

## 2017-09-24 DIAGNOSIS — I5020 Unspecified systolic (congestive) heart failure: Secondary | ICD-10-CM | POA: Insufficient documentation

## 2017-09-24 DIAGNOSIS — D62 Acute posthemorrhagic anemia: Secondary | ICD-10-CM | POA: Diagnosis not present

## 2017-09-24 DIAGNOSIS — Z7902 Long term (current) use of antithrombotics/antiplatelets: Secondary | ICD-10-CM | POA: Insufficient documentation

## 2017-09-24 DIAGNOSIS — S99922A Unspecified injury of left foot, initial encounter: Secondary | ICD-10-CM | POA: Diagnosis not present

## 2017-09-24 DIAGNOSIS — L03039 Cellulitis of unspecified toe: Secondary | ICD-10-CM | POA: Diagnosis not present

## 2017-09-24 DIAGNOSIS — E1151 Type 2 diabetes mellitus with diabetic peripheral angiopathy without gangrene: Secondary | ICD-10-CM | POA: Diagnosis present

## 2017-09-24 DIAGNOSIS — K254 Chronic or unspecified gastric ulcer with hemorrhage: Secondary | ICD-10-CM | POA: Diagnosis not present

## 2017-09-24 DIAGNOSIS — J90 Pleural effusion, not elsewhere classified: Secondary | ICD-10-CM | POA: Diagnosis not present

## 2017-09-24 DIAGNOSIS — M79672 Pain in left foot: Secondary | ICD-10-CM | POA: Diagnosis present

## 2017-09-24 DIAGNOSIS — M7989 Other specified soft tissue disorders: Secondary | ICD-10-CM | POA: Diagnosis not present

## 2017-09-24 DIAGNOSIS — D649 Anemia, unspecified: Secondary | ICD-10-CM | POA: Diagnosis not present

## 2017-09-24 DIAGNOSIS — L8992 Pressure ulcer of unspecified site, stage 2: Secondary | ICD-10-CM | POA: Diagnosis not present

## 2017-09-24 DIAGNOSIS — E039 Hypothyroidism, unspecified: Secondary | ICD-10-CM | POA: Diagnosis not present

## 2017-09-24 DIAGNOSIS — J80 Acute respiratory distress syndrome: Secondary | ICD-10-CM | POA: Diagnosis not present

## 2017-09-24 DIAGNOSIS — I480 Paroxysmal atrial fibrillation: Secondary | ICD-10-CM | POA: Diagnosis not present

## 2017-09-24 DIAGNOSIS — S90929A Unspecified superficial injury of unspecified foot, initial encounter: Secondary | ICD-10-CM | POA: Diagnosis not present

## 2017-09-24 DIAGNOSIS — M6281 Muscle weakness (generalized): Secondary | ICD-10-CM | POA: Diagnosis not present

## 2017-09-24 DIAGNOSIS — Z6839 Body mass index (BMI) 39.0-39.9, adult: Secondary | ICD-10-CM | POA: Diagnosis not present

## 2017-09-24 DIAGNOSIS — R2689 Other abnormalities of gait and mobility: Secondary | ICD-10-CM | POA: Diagnosis not present

## 2017-09-24 DIAGNOSIS — I428 Other cardiomyopathies: Secondary | ICD-10-CM | POA: Diagnosis not present

## 2017-09-24 DIAGNOSIS — Z87891 Personal history of nicotine dependence: Secondary | ICD-10-CM | POA: Diagnosis not present

## 2017-09-24 DIAGNOSIS — J9601 Acute respiratory failure with hypoxia: Secondary | ICD-10-CM | POA: Diagnosis not present

## 2017-09-24 DIAGNOSIS — N179 Acute kidney failure, unspecified: Secondary | ICD-10-CM | POA: Diagnosis present

## 2017-09-24 DIAGNOSIS — J9811 Atelectasis: Secondary | ICD-10-CM | POA: Diagnosis not present

## 2017-09-24 DIAGNOSIS — Z96653 Presence of artificial knee joint, bilateral: Secondary | ICD-10-CM

## 2017-09-24 DIAGNOSIS — Z452 Encounter for adjustment and management of vascular access device: Secondary | ICD-10-CM | POA: Diagnosis not present

## 2017-09-24 DIAGNOSIS — E1165 Type 2 diabetes mellitus with hyperglycemia: Secondary | ICD-10-CM | POA: Diagnosis not present

## 2017-09-24 DIAGNOSIS — I110 Hypertensive heart disease with heart failure: Secondary | ICD-10-CM | POA: Diagnosis not present

## 2017-09-24 DIAGNOSIS — N189 Chronic kidney disease, unspecified: Secondary | ICD-10-CM | POA: Diagnosis not present

## 2017-09-24 DIAGNOSIS — L03115 Cellulitis of right lower limb: Secondary | ICD-10-CM | POA: Insufficient documentation

## 2017-09-24 DIAGNOSIS — Z4682 Encounter for fitting and adjustment of non-vascular catheter: Secondary | ICD-10-CM | POA: Diagnosis not present

## 2017-09-24 DIAGNOSIS — I255 Ischemic cardiomyopathy: Secondary | ICD-10-CM | POA: Diagnosis not present

## 2017-09-24 DIAGNOSIS — I5032 Chronic diastolic (congestive) heart failure: Secondary | ICD-10-CM | POA: Diagnosis not present

## 2017-09-24 DIAGNOSIS — Z96659 Presence of unspecified artificial knee joint: Secondary | ICD-10-CM | POA: Diagnosis present

## 2017-09-24 DIAGNOSIS — I340 Nonrheumatic mitral (valve) insufficiency: Secondary | ICD-10-CM | POA: Diagnosis not present

## 2017-09-24 LAB — CBC WITH DIFFERENTIAL/PLATELET
Basophils Absolute: 0 10*3/uL (ref 0.0–0.1)
Basophils Relative: 0 %
EOS ABS: 0.2 10*3/uL (ref 0.0–0.7)
Eosinophils Relative: 1 %
HEMATOCRIT: 41.5 % (ref 39.0–52.0)
HEMOGLOBIN: 13.7 g/dL (ref 13.0–17.0)
LYMPHS ABS: 0.6 10*3/uL — AB (ref 0.7–4.0)
Lymphocytes Relative: 5 %
MCH: 31.9 pg (ref 26.0–34.0)
MCHC: 33 g/dL (ref 30.0–36.0)
MCV: 96.7 fL (ref 78.0–100.0)
MONOS PCT: 12 %
Monocytes Absolute: 1.4 10*3/uL — ABNORMAL HIGH (ref 0.1–1.0)
NEUTROS ABS: 9.7 10*3/uL — AB (ref 1.7–7.7)
NEUTROS PCT: 81 %
Platelets: 161 10*3/uL (ref 150–400)
RBC: 4.29 MIL/uL (ref 4.22–5.81)
RDW: 14.9 % (ref 11.5–15.5)
WBC: 11.9 10*3/uL — ABNORMAL HIGH (ref 4.0–10.5)

## 2017-09-24 LAB — BASIC METABOLIC PANEL
Anion gap: 9 (ref 5–15)
BUN: 45 mg/dL — AB (ref 6–20)
CALCIUM: 9.3 mg/dL (ref 8.9–10.3)
CHLORIDE: 99 mmol/L — AB (ref 101–111)
CO2: 24 mmol/L (ref 22–32)
CREATININE: 1.6 mg/dL — AB (ref 0.61–1.24)
GFR calc Af Amer: 46 mL/min — ABNORMAL LOW (ref >60)
GFR, EST NON AFRICAN AMERICAN: 40 mL/min — AB (ref >60)
GLUCOSE: 175 mg/dL — AB (ref 65–99)
Potassium: 4.7 mmol/L (ref 3.5–5.1)
Sodium: 132 mmol/L — ABNORMAL LOW (ref 135–145)

## 2017-09-24 LAB — CBG MONITORING, ED: Glucose-Capillary: 178 mg/dL — ABNORMAL HIGH (ref 65–99)

## 2017-09-24 MED ORDER — CIPROFLOXACIN HCL 250 MG PO TABS
500.0000 mg | ORAL_TABLET | Freq: Once | ORAL | Status: AC
  Administered 2017-09-24: 500 mg via ORAL
  Filled 2017-09-24: qty 2

## 2017-09-24 MED ORDER — TETANUS-DIPHTH-ACELL PERTUSSIS 5-2.5-18.5 LF-MCG/0.5 IM SUSP
0.5000 mL | Freq: Once | INTRAMUSCULAR | Status: AC
  Administered 2017-09-24: 0.5 mL via INTRAMUSCULAR
  Filled 2017-09-24: qty 0.5

## 2017-09-24 MED ORDER — CIPROFLOXACIN HCL 500 MG PO TABS: 500.0000 mg | ORAL_TABLET | Freq: Two times a day (BID) | ORAL | 0 refills | Status: DC

## 2017-09-24 MED ORDER — CLINDAMYCIN PHOSPHATE 600 MG/50ML IV SOLN
600.0000 mg | Freq: Once | INTRAVENOUS | Status: AC
  Administered 2017-09-24: 600 mg via INTRAVENOUS
  Filled 2017-09-24: qty 50

## 2017-09-24 MED ORDER — CLINDAMYCIN HCL 150 MG PO CAPS: 300.0000 mg | ORAL_CAPSULE | Freq: Three times a day (TID) | ORAL | 0 refills | Status: DC

## 2017-09-24 NOTE — ED Triage Notes (Signed)
Pt stepped on nail 2 weeks ago. States didn't bother him until last night. C/o pain to bottom of left foot. Small mark there with padding of foot mildy red and swollen. Pt c/o calf muscle hurting.

## 2017-09-24 NOTE — ED Provider Notes (Signed)
[facility] EMERGENCY DEPARTMENT Provider Note   CSN: [id number] Arrival date & time: 09/24/17  9417     History   Chief Complaint Chief Complaint  Patient presents with  . Wound Infection    HPI [name] is a 76 y.o. male.  HPI  The pt is a 76 y/o male who stepped on a nail 2 weeks ago, this went through his shoe and into his foot a very small amount.-he is a known diabetic, he states that over the last couple of days he has had some increasing pain in the plantar aspect of his left foot where the puncture wound was.  He has had no drainage but is started to have some redness and swelling of the left foot.  There is no fevers, no significant hyperglycemia, he has been taking his medications as per diagnosed and treated.  He states that there is some redness on the foot but it is also extending onto the ankle.  He has been ambulatory with some pain.  The pain is persistent, gradually worsening and not associated with any drainage or fevers.  He has not been seen by his physician and has not been taking any antibiotics.  Past Medical History:  Diagnosis Date  . Anemia   . Anemia-chronic    a. Pt reports h/o anemia - was offered IV iron in past by nephrologist but declined and has taken PO instead.  . Aneurysm, cerebral   . BPH (benign prostatic hypertrophy)   . Cardiomyopathy, ischemic    LVEF 25-35%.  . Chronic kidney disease, stage III (moderate) (HCC)    Followed by Dr. Fausto Skillern.  . Coronary atherosclerosis of native coronary artery    a. DES to RCA 03/2009. b. s/p PTCA to RCA for ISR, 05/2010. c. 03/2013 NSTEMI 2/2 severe prox RCA s/p PTCA/DES, med rx for residual dz.  . Essential hypertension, benign   . Hyperlipidemia   . LBBB (left bundle branch block)    s/p BiV ICD Implanted by Dr Graciela Husbands  . Morbid obesity (HCC)   . MRSA (methicillin resistant Staphylococcus aureus)   . Myocardial infarction (HCC)   . Paroxysmal atrial fibrillation (HCC)    a. Coumadin discontinue  in 2010 when the patient required ASA/Plavix for stenting. b. Coumadin restarted 05/2013, Plavix continued, ASA stopped.   . Pulmonary nodule    CXR, 03/2013, [facility] - not described on any subsequent chest x-rays however, could be a vascular structure  . TIA (transient ischemic attack)    Multiple TIAs  . Type 2 diabetes mellitus [facility])     Patient Active Problem List   Diagnosis Date Noted  . Primary osteoarthritis of both hands 06/28/2017  . Primary osteoarthritis of both feet 06/28/2017  . Rheumatoid arthritis involving multiple sites with positive rheumatoid factor (HCC) 03/27/2017  . Pain in both hands 03/24/2017  . H/O total knee replacement, bilateral 03/24/2017  . Pain in both feet 03/24/2017  . High risk medication use 03/24/2017  . Other fatigue 03/24/2017  . History of hypothyroidism 03/24/2017  . Former smoker 03/24/2017  . PVD (peripheral vascular disease) with claudication (HCC) 07/11/2014  . TIA (transient ischemic attack) 05/30/2013  . Long term (current) use of anticoagulants 05/30/2013  . CKD (chronic kidney disease) stage 3, GFR 30-59 ml/min (HCC) 05/29/2013  . Paroxysmal atrial fibrillation (HCC)   . Cardiomyopathy, ischemic   . Biventricular ICD (implantable cardioverter-defibrillator) in place 08/17/2012  . DM 05/15/2010  . Hypertensive cardiovascular disease 04/25/2010  . HYPERLIPIDEMIA-MIXED  08/28/2009  . CAD, NATIVE VESSEL 08/28/2009  . LBBB 08/28/2009  . SYSTOLIC HEART FAILURE, CHRONIC 08/28/2009    Past Surgical History:  Procedure Laterality Date  . CHOLECYSTECTOMY    . ICD placement  05/06/2011   [employer] CRT-D  . LUNG SURGERY     23 - 24 yrs. old  . TOTAL KNEE ARTHROPLASTY    . VASECTOMY         Home Medications    Prior to Admission medications   Medication Sig Start Date End Date Taking? Authorizing Provider  allopurinol (ZYLOPRIM) 100 MG tablet Take 100 mg by mouth daily.    [provider]  carvedilol (COREG) 12.5 MG tablet  TAKE 1 AND 1/2 TABLETS BY MOUTH TWICE DAILY WITH MEALS (DUE FOR FOLLOW UP) 06/30/17   Jonelle Sidle, MD  ciprofloxacin (CIPRO) 500 MG tablet Take 1 tablet (500 mg total) every 12 (twelve) hours by mouth. 09/24/17   Eber Hong, MD  clindamycin (CLEOCIN) 150 MG capsule Take 2 capsules (300 mg total) 3 (three) times daily by mouth. May dispense as 150mg  capsules 09/24/17   13/9/18, MD  clopidogrel (PLAVIX) 75 MG tablet Take 1 tablet (75 mg total) by mouth daily. 03/25/17   Allred, 05/25/17, MD  doxycycline (VIBRA-TABS) 100 MG tablet Take 1 tablet (100 mg total) by mouth 2 (two) times daily. 08/04/17   08/06/17, DPM  hydrALAZINE (APRESOLINE) 25 MG tablet TAKE ONE TABLET BY MOUTH THREE TIMES DAILY 07/20/17   09/19/17, MD  HYDROcodone-acetaminophen (NORCO) 7.5-325 MG tablet Take 1 tablet by mouth 4 (four) times daily as needed for moderate pain.    [provider]  insulin lispro protamine-insulin lispro (HUMALOG 75/25) (75-25) 100 UNIT/ML SUSP Inject 22-35 Units into the skin See admin instructions. Takes 35 units in the morning with breakfast and 35 units at supper (Will take 22-25 at lunch time occasionally)    [provider]  isosorbide mononitrate (IMDUR) 30 MG 24 hr tablet TAKE ONE TABLET BY MOUTH ONCE DAILY 05/28/17   05/30/17, MD  levothyroxine (SYNTHROID, LEVOTHROID) 50 MCG tablet Take 50 mcg by mouth daily. 06/29/17   [provider]  lisinopril (PRINIVIL,ZESTRIL) 2.5 MG tablet Take 2.5 mg by mouth daily.    [provider]  loratadine (CLARITIN) 10 MG tablet Take 10 mg by mouth daily.    [provider]  nitroGLYCERIN (NITROSTAT) 0.4 MG SL tablet Place 0.4 mg under the tongue every 5 (five) minutes as needed for chest pain. For chest pain 04/26/13   Serpe, 06/26/13, PA-C  sulfaSALAzine (AZULFIDINE EN-TABS) 500 MG EC tablet Take 1 tablet (500 mg total) by mouth 2 (two) times daily. 03/30/17   04/01/17, MD  tamsulosin  (FLOMAX) 0.4 MG CAPS capsule Take 0.4 mg by mouth.    [provider]  torsemide (DEMADEX) 10 MG tablet Take 1 tablet (10 mg total) by mouth 2 (two) times daily. 12/30/15   01/01/16, MD  torsemide (DEMADEX) 10 MG tablet TAKE 1 TABLET BY MOUTH TWICE DAILY 09/15/17   09/17/17, MD  warfarin (COUMADIN) 5 MG tablet Take 0.5-1 tablets (2.5-5 mg total) by mouth daily at 6 PM. Takes 2.5mg  on Sunday, Monday, Wednesday and Friday  Takes 5mg  on Tuesday, Thursday and Sunday 03/19/17   Monday, MD  Zinc 50 MG CAPS Take 50 mg by mouth daily.     [provider]    Family History Family History  Problem Relation  Age of Onset  . Diabetes Mother        Bilateral leg amputation  . Heart disease Mother        Before age 52  . Hypertension Mother   . Cancer Father        Prostate and Bone  . Diabetes Father   . Cancer Brother        Prostate  . Heart disease Other        family h/o premature cardiovascular disease  . Cancer Sister        Colon   . Hypertension Sister     Social History Social History   Tobacco Use  . Smoking status: Former Smoker    Packs/day: 1.00    Years: 6.00    Pack years: 6.00    Types: Cigarettes    Start date: 12/08/1949    Last attempt to quit: 11/16/1964    Years since quitting: 52.8  . Smokeless tobacco: Former Neurosurgeon    Quit date: 04/15/1965  Substance Use Topics  . Alcohol use: Yes    Alcohol/week: 0.0 oz    Comment: once/month  . Drug use: No     Allergies   Allegra [fexofenadine]; Biaxin [clarithromycin]; Crestor [rosuvastatin calcium]; Glucophage [metformin hcl]; Trimethoprim; and Codeine   Review of Systems Review of Systems  All other systems reviewed and are negative.    Physical Exam Updated Vital Signs BP (!) 176/76   Pulse 78   Temp 98.5 F (36.9 C) (Oral)   Resp 20   SpO2 95%   Physical Exam  Constitutional: He appears well-developed and well-nourished. No distress.  HENT:  Head:  Normocephalic and atraumatic.  Mouth/Throat: Oropharynx is clear and moist. No oropharyngeal exudate.  Eyes: Conjunctivae and EOM are normal. Pupils are equal, round, and reactive to light. Right eye exhibits no discharge. Left eye exhibits no discharge. No scleral icterus.  Neck: Normal range of motion. Neck supple. No JVD present. No thyromegaly present.  Cardiovascular: Normal rate, regular rhythm, normal heart sounds and intact distal pulses. Exam reveals no gallop and no friction rub.  No murmur heard. No tachycardia, normal pulses at the dorsalis pedis bilaterally  Pulmonary/Chest: Effort normal and breath sounds normal. No respiratory distress. He has no wheezes. He has no rales.  Abdominal: Soft. Bowel sounds are normal. He exhibits no distension and no mass. There is no tenderness.  Musculoskeletal: Normal range of motion. He exhibits tenderness. He exhibits no edema.  There is redness of the left foot particularly on the plantar aspect on the pad of the foot.  There is a very tiny puncture area, there is no surrounding induration, there is no fluctuance, there is minimal swelling of the left foot compared to the right.  There is some mild erythema on the distal lower extremity just above the ankle.  There is no edema of the leg.  Lymphadenopathy:    He has no cervical adenopathy.  Neurological: He is alert. Coordination normal.  Skin: Skin is warm and dry. No rash noted. There is erythema.  Psychiatric: He has a normal mood and affect. His behavior is normal.  Nursing note and vitals reviewed.    ED Treatments / Results  Labs (all labs ordered are listed, but only abnormal results are displayed) Labs Reviewed  CBC WITH DIFFERENTIAL/PLATELET - Abnormal; Notable for the following components:      Result Value   WBC 11.9 (*)    Neutro Abs 9.7 (*)    Lymphs Abs  0.6 (*)    Monocytes Absolute 1.4 (*)    All other components within normal limits  BASIC METABOLIC PANEL - Abnormal;  Notable for the following components:   Sodium 132 (*)    Chloride 99 (*)    Glucose, Bld 175 (*)    BUN 45 (*)    Creatinine, Ser 1.60 (*)    GFR calc non Af Amer 40 (*)    GFR calc Af Amer 46 (*)    All other components within normal limits  CBG MONITORING, ED - Abnormal; Notable for the following components:   Glucose-Capillary 178 (*)    All other components within normal limits     Radiology Dg Foot Complete Left  Result Date: 09/24/2017 CLINICAL DATA:  Pain after stepping on nail EXAM: LEFT FOOT - COMPLETE 3+ VIEW COMPARISON:  None. FINDINGS: Frontal, oblique, and lateral views were obtained. No radiopaque foreign body evident. No soft tissue air evident. No fracture or dislocation. No erosive change or bony destruction. There is mild narrowing at the first MTP joint. There is spurring in the dorsal midfoot. There are spurs arising from the posterior and inferior calcaneus. There are foci of arterial vascular calcification throughout the foot and ankle regions. IMPRESSION: No radiopaque foreign body. No bony destruction or erosion. No fracture or dislocation. Spurring dorsal midfoot. Mild narrowing first MTP joint. Calcaneal spurs noted. Multiple foci of arterial vascular calcification noted. Electronically Signed   By: Bretta Bang III M.D.   On: 09/24/2017 09:26    Procedures Procedures (including critical care time)  Medications Ordered in ED Medications  clindamycin (CLEOCIN) IVPB 600 mg (0 mg Intravenous Stopped 09/24/17 0930)  ciprofloxacin (CIPRO) tablet 500 mg (500 mg Oral Given 09/24/17 0846)  Tdap (BOOSTRIX) injection 0.5 mL (0.5 mLs Intramuscular Given 09/24/17 0934)     Initial Impression / Assessment and Plan / ED Course  I have reviewed the triage vital signs and the nursing notes.  Pertinent labs & imaging results that were available during my care of the patient were reviewed by me and considered in my medical decision making (see chart for details).      Diabetic with cellulitis of the foot and the distal lower extremity.  There is no systemic symptoms, no fever, check white blood cell count.  He will need clindamycin and ciprofloxacin to cover for Pseudomonas as well.  The patient will have an x-ray to rule out any retained foreign body though it sounds like this was a very short nail that barely penetrated through his shoe to puncture the bottom of his foot.  Tetanus updated X-ray reveals no foreign body or gas in the tissue Cipro and Clinda given Patient stable for discharge Return precautions given  Final Clinical Impressions(s) / ED Diagnoses   Final diagnoses:  Cellulitis of foot, right    ED Discharge Orders        Ordered    clindamycin (CLEOCIN) 150 MG capsule  3 times daily     09/24/17 0937    ciprofloxacin (CIPRO) 500 MG tablet  Every 12 hours     09/24/17 0937       Eber Hong, MD 09/24/17 [phone]

## 2017-09-24 NOTE — Discharge Instructions (Signed)
Clindamycin - 3 times a day  Cipro twice a day.  Any medication that you take will change the way that Coumadin works.  Please have your family doctor recheck your Coumadin level in 48 hours at your family doctor's office.  If you should develop increasing pain swelling redness fever, please return to the emergency department immediately.

## 2017-09-27 ENCOUNTER — Emergency Department (HOSPITAL_COMMUNITY): Payer: PPO

## 2017-09-27 ENCOUNTER — Inpatient Hospital Stay (HOSPITAL_COMMUNITY)
Admission: EM | Admit: 2017-09-27 | Discharge: 2017-10-15 | DRG: 270 | Disposition: A | Discharge status: Skilled Nursing Facility | Payer: PPO | Attending: Internal Medicine | Admitting: Internal Medicine

## 2017-09-27 ENCOUNTER — Other Ambulatory Visit: Payer: Self-pay

## 2017-09-27 ENCOUNTER — Encounter (HOSPITAL_COMMUNITY): Payer: Self-pay | Admitting: Cardiology

## 2017-09-27 DIAGNOSIS — I130 Hypertensive heart and chronic kidney disease with heart failure and stage 1 through stage 4 chronic kidney disease, or unspecified chronic kidney disease: Secondary | ICD-10-CM | POA: Diagnosis present

## 2017-09-27 DIAGNOSIS — E1122 Type 2 diabetes mellitus with diabetic chronic kidney disease: Secondary | ICD-10-CM | POA: Diagnosis present

## 2017-09-27 DIAGNOSIS — L8990 Pressure ulcer of unspecified site, unspecified stage: Secondary | ICD-10-CM

## 2017-09-27 DIAGNOSIS — T4275XA Adverse effect of unspecified antiepileptic and sedative-hypnotic drugs, initial encounter: Secondary | ICD-10-CM | POA: Diagnosis not present

## 2017-09-27 DIAGNOSIS — Z6839 Body mass index (BMI) 39.0-39.9, adult: Secondary | ICD-10-CM | POA: Diagnosis not present

## 2017-09-27 DIAGNOSIS — E1159 Type 2 diabetes mellitus with other circulatory complications: Secondary | ICD-10-CM | POA: Diagnosis present

## 2017-09-27 DIAGNOSIS — E1151 Type 2 diabetes mellitus with diabetic peripheral angiopathy without gangrene: Secondary | ICD-10-CM | POA: Diagnosis present

## 2017-09-27 DIAGNOSIS — Z95810 Presence of automatic (implantable) cardiac defibrillator: Secondary | ICD-10-CM | POA: Diagnosis present

## 2017-09-27 DIAGNOSIS — E039 Hypothyroidism, unspecified: Secondary | ICD-10-CM | POA: Diagnosis present

## 2017-09-27 DIAGNOSIS — E11628 Type 2 diabetes mellitus with other skin complications: Secondary | ICD-10-CM | POA: Diagnosis present

## 2017-09-27 DIAGNOSIS — I998 Other disorder of circulatory system: Secondary | ICD-10-CM | POA: Diagnosis present

## 2017-09-27 DIAGNOSIS — I110 Hypertensive heart disease with heart failure: Secondary | ICD-10-CM | POA: Diagnosis not present

## 2017-09-27 DIAGNOSIS — Z6841 Body Mass Index (BMI) 40.0 and over, adult: Secondary | ICD-10-CM

## 2017-09-27 DIAGNOSIS — E872 Acidosis: Secondary | ICD-10-CM | POA: Diagnosis not present

## 2017-09-27 DIAGNOSIS — Z8249 Family history of ischemic heart disease and other diseases of the circulatory system: Secondary | ICD-10-CM

## 2017-09-27 DIAGNOSIS — E869 Volume depletion, unspecified: Secondary | ICD-10-CM | POA: Diagnosis present

## 2017-09-27 DIAGNOSIS — I480 Paroxysmal atrial fibrillation: Secondary | ICD-10-CM | POA: Diagnosis present

## 2017-09-27 DIAGNOSIS — M069 Rheumatoid arthritis, unspecified: Secondary | ICD-10-CM | POA: Diagnosis present

## 2017-09-27 DIAGNOSIS — R578 Other shock: Secondary | ICD-10-CM | POA: Diagnosis not present

## 2017-09-27 DIAGNOSIS — Z7901 Long term (current) use of anticoagulants: Secondary | ICD-10-CM | POA: Diagnosis not present

## 2017-09-27 DIAGNOSIS — E878 Other disorders of electrolyte and fluid balance, not elsewhere classified: Secondary | ICD-10-CM | POA: Diagnosis not present

## 2017-09-27 DIAGNOSIS — J9601 Acute respiratory failure with hypoxia: Secondary | ICD-10-CM | POA: Diagnosis not present

## 2017-09-27 DIAGNOSIS — K920 Hematemesis: Secondary | ICD-10-CM | POA: Diagnosis not present

## 2017-09-27 DIAGNOSIS — I739 Peripheral vascular disease, unspecified: Secondary | ICD-10-CM

## 2017-09-27 DIAGNOSIS — I255 Ischemic cardiomyopathy: Secondary | ICD-10-CM | POA: Diagnosis present

## 2017-09-27 DIAGNOSIS — I4891 Unspecified atrial fibrillation: Secondary | ICD-10-CM | POA: Diagnosis not present

## 2017-09-27 DIAGNOSIS — L03116 Cellulitis of left lower limb: Secondary | ICD-10-CM

## 2017-09-27 DIAGNOSIS — L089 Local infection of the skin and subcutaneous tissue, unspecified: Secondary | ICD-10-CM | POA: Diagnosis not present

## 2017-09-27 DIAGNOSIS — N183 Chronic kidney disease, stage 3 unspecified: Secondary | ICD-10-CM | POA: Diagnosis present

## 2017-09-27 DIAGNOSIS — N179 Acute kidney failure, unspecified: Secondary | ICD-10-CM | POA: Diagnosis present

## 2017-09-27 DIAGNOSIS — E875 Hyperkalemia: Secondary | ICD-10-CM | POA: Diagnosis not present

## 2017-09-27 DIAGNOSIS — Z833 Family history of diabetes mellitus: Secondary | ICD-10-CM

## 2017-09-27 DIAGNOSIS — K922 Gastrointestinal hemorrhage, unspecified: Secondary | ICD-10-CM | POA: Diagnosis not present

## 2017-09-27 DIAGNOSIS — M79672 Pain in left foot: Secondary | ICD-10-CM | POA: Diagnosis present

## 2017-09-27 DIAGNOSIS — J9690 Respiratory failure, unspecified, unspecified whether with hypoxia or hypercapnia: Secondary | ICD-10-CM

## 2017-09-27 DIAGNOSIS — E11621 Type 2 diabetes mellitus with foot ulcer: Secondary | ICD-10-CM | POA: Diagnosis present

## 2017-09-27 DIAGNOSIS — K254 Chronic or unspecified gastric ulcer with hemorrhage: Secondary | ICD-10-CM | POA: Diagnosis not present

## 2017-09-27 DIAGNOSIS — Z87891 Personal history of nicotine dependence: Secondary | ICD-10-CM

## 2017-09-27 DIAGNOSIS — R609 Edema, unspecified: Secondary | ICD-10-CM | POA: Diagnosis not present

## 2017-09-27 DIAGNOSIS — Z7902 Long term (current) use of antithrombotics/antiplatelets: Secondary | ICD-10-CM

## 2017-09-27 DIAGNOSIS — J9600 Acute respiratory failure, unspecified whether with hypoxia or hypercapnia: Secondary | ICD-10-CM | POA: Diagnosis not present

## 2017-09-27 DIAGNOSIS — J9602 Acute respiratory failure with hypercapnia: Secondary | ICD-10-CM

## 2017-09-27 DIAGNOSIS — I252 Old myocardial infarction: Secondary | ICD-10-CM

## 2017-09-27 DIAGNOSIS — L89159 Pressure ulcer of sacral region, unspecified stage: Secondary | ICD-10-CM | POA: Diagnosis present

## 2017-09-27 DIAGNOSIS — R791 Abnormal coagulation profile: Secondary | ICD-10-CM | POA: Diagnosis not present

## 2017-09-27 DIAGNOSIS — Z4659 Encounter for fitting and adjustment of other gastrointestinal appliance and device: Secondary | ICD-10-CM

## 2017-09-27 DIAGNOSIS — Z789 Other specified health status: Secondary | ICD-10-CM

## 2017-09-27 DIAGNOSIS — D62 Acute posthemorrhagic anemia: Secondary | ICD-10-CM | POA: Diagnosis not present

## 2017-09-27 DIAGNOSIS — Z79899 Other long term (current) drug therapy: Secondary | ICD-10-CM

## 2017-09-27 DIAGNOSIS — Z79890 Hormone replacement therapy (postmenopausal): Secondary | ICD-10-CM

## 2017-09-27 DIAGNOSIS — I2510 Atherosclerotic heart disease of native coronary artery without angina pectoris: Secondary | ICD-10-CM | POA: Diagnosis present

## 2017-09-27 DIAGNOSIS — Z9049 Acquired absence of other specified parts of digestive tract: Secondary | ICD-10-CM

## 2017-09-27 DIAGNOSIS — M109 Gout, unspecified: Secondary | ICD-10-CM | POA: Diagnosis present

## 2017-09-27 DIAGNOSIS — Z9861 Coronary angioplasty status: Secondary | ICD-10-CM | POA: Diagnosis not present

## 2017-09-27 DIAGNOSIS — Z299 Encounter for prophylactic measures, unspecified: Secondary | ICD-10-CM | POA: Diagnosis not present

## 2017-09-27 DIAGNOSIS — R338 Other retention of urine: Secondary | ICD-10-CM | POA: Diagnosis present

## 2017-09-27 DIAGNOSIS — E1149 Type 2 diabetes mellitus with other diabetic neurological complication: Secondary | ICD-10-CM | POA: Diagnosis present

## 2017-09-27 DIAGNOSIS — E1165 Type 2 diabetes mellitus with hyperglycemia: Secondary | ICD-10-CM | POA: Diagnosis not present

## 2017-09-27 DIAGNOSIS — Z8673 Personal history of transient ischemic attack (TIA), and cerebral infarction without residual deficits: Secondary | ICD-10-CM

## 2017-09-27 DIAGNOSIS — I428 Other cardiomyopathies: Secondary | ICD-10-CM | POA: Diagnosis not present

## 2017-09-27 DIAGNOSIS — L97529 Non-pressure chronic ulcer of other part of left foot with unspecified severity: Secondary | ICD-10-CM | POA: Diagnosis present

## 2017-09-27 DIAGNOSIS — Z96659 Presence of unspecified artificial knee joint: Secondary | ICD-10-CM | POA: Diagnosis present

## 2017-09-27 DIAGNOSIS — Z794 Long term (current) use of insulin: Secondary | ICD-10-CM

## 2017-09-27 DIAGNOSIS — R0600 Dyspnea, unspecified: Secondary | ICD-10-CM

## 2017-09-27 DIAGNOSIS — R911 Solitary pulmonary nodule: Secondary | ICD-10-CM | POA: Diagnosis present

## 2017-09-27 DIAGNOSIS — I119 Hypertensive heart disease without heart failure: Secondary | ICD-10-CM | POA: Diagnosis present

## 2017-09-27 DIAGNOSIS — E7800 Pure hypercholesterolemia, unspecified: Secondary | ICD-10-CM | POA: Diagnosis present

## 2017-09-27 DIAGNOSIS — Z23 Encounter for immunization: Secondary | ICD-10-CM

## 2017-09-27 DIAGNOSIS — E669 Obesity, unspecified: Secondary | ICD-10-CM | POA: Diagnosis present

## 2017-09-27 DIAGNOSIS — N401 Enlarged prostate with lower urinary tract symptoms: Secondary | ICD-10-CM | POA: Diagnosis present

## 2017-09-27 DIAGNOSIS — L03119 Cellulitis of unspecified part of limb: Secondary | ICD-10-CM | POA: Diagnosis not present

## 2017-09-27 DIAGNOSIS — E785 Hyperlipidemia, unspecified: Secondary | ICD-10-CM | POA: Diagnosis present

## 2017-09-27 DIAGNOSIS — I340 Nonrheumatic mitral (valve) insufficiency: Secondary | ICD-10-CM | POA: Diagnosis not present

## 2017-09-27 DIAGNOSIS — R230 Cyanosis: Secondary | ICD-10-CM | POA: Diagnosis not present

## 2017-09-27 DIAGNOSIS — I5032 Chronic diastolic (congestive) heart failure: Secondary | ICD-10-CM | POA: Diagnosis not present

## 2017-09-27 HISTORY — DX: Presence of automatic (implantable) cardiac defibrillator: Z95.810

## 2017-09-27 HISTORY — DX: Unspecified osteoarthritis, unspecified site: M19.90

## 2017-09-27 HISTORY — DX: Pneumonia, unspecified organism: J18.9

## 2017-09-27 HISTORY — DX: Hypothyroidism, unspecified: E03.9

## 2017-09-27 LAB — CBC WITH DIFFERENTIAL/PLATELET
BASOS ABS: 0 10*3/uL (ref 0.0–0.1)
BASOS PCT: 0 %
EOS ABS: 0.2 10*3/uL (ref 0.0–0.7)
EOS PCT: 2 %
HCT: 38 % — ABNORMAL LOW (ref 39.0–52.0)
Hemoglobin: 12.3 g/dL — ABNORMAL LOW (ref 13.0–17.0)
Lymphocytes Relative: 6 %
Lymphs Abs: 0.6 10*3/uL — ABNORMAL LOW (ref 0.7–4.0)
MCH: 31.9 pg (ref 26.0–34.0)
MCHC: 32.4 g/dL (ref 30.0–36.0)
MCV: 98.4 fL (ref 78.0–100.0)
MONO ABS: 1.2 10*3/uL — AB (ref 0.1–1.0)
Monocytes Relative: 11 %
Neutro Abs: 9 10*3/uL — ABNORMAL HIGH (ref 1.7–7.7)
Neutrophils Relative %: 81 %
PLATELETS: 209 10*3/uL (ref 150–400)
RBC: 3.86 MIL/uL — ABNORMAL LOW (ref 4.22–5.81)
RDW: 14.3 % (ref 11.5–15.5)
WBC: 11.1 10*3/uL — ABNORMAL HIGH (ref 4.0–10.5)

## 2017-09-27 LAB — SEDIMENTATION RATE: Sed Rate: 75 mm/hr — ABNORMAL HIGH (ref 0–16)

## 2017-09-27 LAB — GLUCOSE, CAPILLARY: Glucose-Capillary: 243 mg/dL — ABNORMAL HIGH (ref 65–99)

## 2017-09-27 LAB — C-REACTIVE PROTEIN: CRP: 21.6 mg/dL — ABNORMAL HIGH (ref <1.0)

## 2017-09-27 LAB — BASIC METABOLIC PANEL
ANION GAP: 10 (ref 5–15)
BUN: 50 mg/dL — ABNORMAL HIGH (ref 6–20)
CALCIUM: 9 mg/dL (ref 8.9–10.3)
CO2: 21 mmol/L — AB (ref 22–32)
Chloride: 99 mmol/L — ABNORMAL LOW (ref 101–111)
Creatinine, Ser: 2.1 mg/dL — ABNORMAL HIGH (ref 0.61–1.24)
GFR calc non Af Amer: 29 mL/min — ABNORMAL LOW (ref >60)
GFR, EST AFRICAN AMERICAN: 33 mL/min — AB (ref >60)
GLUCOSE: 213 mg/dL — AB (ref 65–99)
Potassium: 5 mmol/L (ref 3.5–5.1)
SODIUM: 130 mmol/L — AB (ref 135–145)

## 2017-09-27 LAB — PROTIME-INR
INR: 1.85
Prothrombin Time: 21.2 seconds — ABNORMAL HIGH (ref 11.4–15.2)

## 2017-09-27 MED ORDER — VANCOMYCIN HCL 10 G IV SOLR: 1500.0000 mg | INTRAVENOUS | Status: DC

## 2017-09-27 MED ORDER — PIPERACILLIN-TAZOBACTAM 3.375 G IVPB 30 MIN
3.3750 g | Freq: Once | INTRAVENOUS | Status: AC
  Administered 2017-09-27: 3.375 g via INTRAVENOUS
  Filled 2017-09-27: qty 50

## 2017-09-27 MED ORDER — ACETAMINOPHEN 650 MG RE SUPP: 650.0000 mg | Freq: Four times a day (QID) | RECTAL | Status: DC | PRN

## 2017-09-27 MED ORDER — LEVOTHYROXINE SODIUM 50 MCG PO TABS
50.0000 ug | ORAL_TABLET | Freq: Every day | ORAL | Status: DC
  Administered 2017-09-28 – 2017-10-06 (×9): 50 ug via ORAL
  Filled 2017-09-27: qty 1
  Filled 2017-09-27 (×2): qty 2
  Filled 2017-09-27 (×6): qty 1

## 2017-09-27 MED ORDER — CARVEDILOL 12.5 MG PO TABS
12.5000 mg | ORAL_TABLET | Freq: Two times a day (BID) | ORAL | Status: DC
  Administered 2017-09-27 – 2017-10-05 (×16): 12.5 mg via ORAL
  Filled 2017-09-27 (×17): qty 1

## 2017-09-27 MED ORDER — ALLOPURINOL 100 MG PO TABS
100.0000 mg | ORAL_TABLET | Freq: Every day | ORAL | Status: DC
  Administered 2017-09-28 – 2017-10-05 (×8): 100 mg via ORAL
  Filled 2017-09-27 (×8): qty 1

## 2017-09-27 MED ORDER — VANCOMYCIN HCL IN DEXTROSE 1-5 GM/200ML-% IV SOLN
1000.0000 mg | Freq: Once | INTRAVENOUS | Status: AC
  Administered 2017-09-27: 1000 mg via INTRAVENOUS
  Filled 2017-09-27: qty 200

## 2017-09-27 MED ORDER — ONDANSETRON HCL 4 MG/2ML IJ SOLN
4.0000 mg | Freq: Four times a day (QID) | INTRAMUSCULAR | Status: DC | PRN
  Administered 2017-10-04 (×2): 4 mg via INTRAVENOUS
  Filled 2017-09-27 (×2): qty 2

## 2017-09-27 MED ORDER — ACETAMINOPHEN 325 MG PO TABS
650.0000 mg | ORAL_TABLET | Freq: Four times a day (QID) | ORAL | Status: DC | PRN
  Administered 2017-10-04: 650 mg via ORAL
  Filled 2017-09-27: qty 2

## 2017-09-27 MED ORDER — ZINC 50 MG PO CAPS: 50.0000 mg | ORAL_CAPSULE | Freq: Every day | ORAL | Status: DC

## 2017-09-27 MED ORDER — SODIUM CHLORIDE 0.9 % IV SOLN
INTRAVENOUS | Status: AC
  Administered 2017-09-27 – 2017-09-28 (×2): via INTRAVENOUS

## 2017-09-27 MED ORDER — WARFARIN - PHARMACIST DOSING INPATIENT
Status: DC
  Administered 2017-09-28 – 2017-10-04 (×4)

## 2017-09-27 MED ORDER — INSULIN ASPART 100 UNIT/ML ~~LOC~~ SOLN
0.0000 [IU] | Freq: Every day | SUBCUTANEOUS | Status: DC
  Administered 2017-09-27: 2 [IU] via SUBCUTANEOUS

## 2017-09-27 MED ORDER — TAMSULOSIN HCL 0.4 MG PO CAPS
0.4000 mg | ORAL_CAPSULE | Freq: Every day | ORAL | Status: DC
  Administered 2017-09-28 – 2017-10-04 (×7): 0.4 mg via ORAL
  Filled 2017-09-27 (×7): qty 1

## 2017-09-27 MED ORDER — DEXTROSE 5 % IV SOLN
2.0000 g | INTRAVENOUS | Status: DC
  Administered 2017-09-27: 2 g via INTRAVENOUS
  Filled 2017-09-27 (×3): qty 2

## 2017-09-27 MED ORDER — WARFARIN SODIUM 2.5 MG PO TABS
2.5000 mg | ORAL_TABLET | Freq: Once | ORAL | Status: AC
  Administered 2017-09-27: 2.5 mg via ORAL
  Filled 2017-09-27: qty 1

## 2017-09-27 MED ORDER — ONDANSETRON HCL 4 MG PO TABS: 4.0000 mg | ORAL_TABLET | Freq: Four times a day (QID) | ORAL | Status: DC | PRN

## 2017-09-27 MED ORDER — CLOPIDOGREL BISULFATE 75 MG PO TABS
75.0000 mg | ORAL_TABLET | Freq: Every day | ORAL | Status: DC
  Administered 2017-09-28 – 2017-10-03 (×6): 75 mg via ORAL
  Filled 2017-09-27 (×6): qty 1

## 2017-09-27 MED ORDER — INSULIN ASPART 100 UNIT/ML ~~LOC~~ SOLN
0.0000 [IU] | Freq: Three times a day (TID) | SUBCUTANEOUS | Status: DC
  Administered 2017-09-28: 3 [IU] via SUBCUTANEOUS
  Administered 2017-09-28: 5 [IU] via SUBCUTANEOUS

## 2017-09-27 MED ORDER — ISOSORBIDE MONONITRATE ER 60 MG PO TB24
30.0000 mg | ORAL_TABLET | Freq: Every day | ORAL | Status: DC
  Administered 2017-09-28 – 2017-10-04 (×7): 30 mg via ORAL
  Filled 2017-09-27 (×7): qty 1

## 2017-09-27 MED ORDER — HYDRALAZINE HCL 25 MG PO TABS
25.0000 mg | ORAL_TABLET | Freq: Three times a day (TID) | ORAL | Status: DC
  Administered 2017-09-27 – 2017-10-04 (×22): 25 mg via ORAL
  Filled 2017-09-27 (×22): qty 1

## 2017-09-27 MED ORDER — HYDROCODONE-ACETAMINOPHEN 7.5-325 MG PO TABS
1.0000 | ORAL_TABLET | Freq: Four times a day (QID) | ORAL | Status: DC | PRN
  Administered 2017-09-27 – 2017-10-03 (×13): 1 via ORAL
  Filled 2017-09-27 (×13): qty 1

## 2017-09-27 MED ORDER — SULFASALAZINE 500 MG PO TBEC
500.0000 mg | DELAYED_RELEASE_TABLET | Freq: Two times a day (BID) | ORAL | Status: DC
  Administered 2017-09-27 – 2017-10-03 (×13): 500 mg via ORAL
  Filled 2017-09-27 (×15): qty 1

## 2017-09-27 MED ORDER — SODIUM CHLORIDE 0.9 % IV BOLUS (SEPSIS)
500.0000 mL | Freq: Once | INTRAVENOUS | Status: AC
  Administered 2017-09-27: 500 mL via INTRAVENOUS

## 2017-09-27 MED ORDER — HEPARIN SODIUM (PORCINE) 5000 UNIT/ML IJ SOLN
5000.0000 [IU] | Freq: Three times a day (TID) | INTRAMUSCULAR | Status: DC
  Administered 2017-09-27 – 2017-10-01 (×11): 5000 [IU] via SUBCUTANEOUS
  Filled 2017-09-27 (×11): qty 1

## 2017-09-27 NOTE — H&P (Addendum)
History and Physical  [name] BCW:[id number] DOB: 03/14/1941 DOA: 09/27/2017   PCP: Monico Blitz, MD   Patient coming from: Home  Chief Complaint: Left foot pain  HPI:  [name] is a 76 y.o. male with medical history of diabetes mellitus, coronary artery disease, paroxysmal atrial fibrillation, CKD stage III, rheumatoid arthritis, ischemic cardiomyopathy status post ICD presented with 3-day history of increasing pain, edema, erythema about his left foot.  The patient stated that he stepped on a nail that went through his shoe approximately 2 weeks prior to this admission.  He stated that he had a small amount of bleeding at that time.  The patient was soaking his foot in Epsom salts over the period of 2 weeks.  However on September 23, 2017, the patient noted increasing pain and edema.  He visited emergency department on September 24, 2017.  X-rays were negative for any erosions.  The patient was sent home with ciprofloxacin and clindamycin.  The patient endorsed compliance.  However, over the past 2-3 days he has noted increasing edema, pain, and erythema streaking up the proximal aspect of the foot.  As result, he presented to the emergency department for further evaluation.  He denied any fevers, chills, chest pain, shortness breath, nausea, vomiting, diarrhea, abdominal pain, dysuria, hematuria.  He denies any headache or neck pain.  He denies any new injuries to his foot. In the emergency department, the patient was afebrile hemodynamically stable.  Oxygen saturation was 95-98% on room air.  WBC was 11.1.  Sodium was 130 with potassium 5.0.  Serum creatinine was 2.10.  The patient was given a dose of vancomycin and Zosyn.    Assessment/Plan: Left diabetic foot infection -Concerned about underlying abscess/osteomyelitis -ESR -CRP -Continue vancomycin -Start cefepime -Unable to obtain MRI secondary to ICD -CT foot  Acute on chronic renal failure-CKD stage III -Baseline  creatinine 1.5-1.7 -Presenting creatinine 2.10 -Likely due to volume depletion -IV fluids times 24 hours -Repeat BMP -Holding lisinopril -Holding torsemide  Paroxysmal atrial fibrillation -CHADS-VASc score = 8 -Rate controlled -Continue warfarin -Continue carvedilol  Coronary artery disease -No anginal symptoms -Status post multiple interventions to the RCA -Continue carvedilol  Diabetes mellitus type 2 -Check hemoglobin A1c -Continue half home dose of 70/30 -NovoLog sliding scale   Ischemic cardiomyopathy -Appears clinically euvolemic -February 21, 2017 Echo EF 55-60%, grade 1 DD -Status post PPM- -continue hydralazine/Imdur  Rheumatoid arthritis -Continue sulfasalazine  Essential hypertension -Controlled -Continue carvedilol, hydralazine, Imdur    Active Problems:   Diabetic foot infection (HCC)       Past Medical History:  Diagnosis Date  . Anemia   . Anemia-chronic    a. Pt reports h/o anemia - was offered IV iron in past by nephrologist but declined and has taken PO instead.  . Aneurysm, cerebral   . BPH (benign prostatic hypertrophy)   . Cardiomyopathy, ischemic    LVEF 25-35%.  . Chronic kidney disease, stage III (moderate) (HCC)    Followed by Dr. Hinda Lenis.  . Coronary atherosclerosis of native coronary artery    a. DES to RCA 03/2009. b. s/p PTCA to RCA for ISR, 05/2010. c. 03/2013 NSTEMI 2/2 severe prox RCA s/p PTCA/DES, med rx for residual dz.  . Essential hypertension, benign   . Hyperlipidemia   . LBBB (left bundle branch block)    s/p BiV ICD Implanted by Dr Caryl Comes  . Morbid obesity ([place])   . MRSA (methicillin resistant Staphylococcus aureus)   .  Myocardial infarction ([place])   . Paroxysmal atrial fibrillation ([place])    a. Coumadin discontinue in 2010 when the patient required ASA/Plavix for stenting. b. Coumadin restarted 05/2013, Plavix continued, ASA stopped.   . Pulmonary nodule    CXR, 03/2013, [facility] - not described on any subsequent chest  x-rays however, could be a vascular structure  . TIA (transient ischemic attack)    Multiple TIAs  . Type 2 diabetes mellitus ([place])    Past Surgical History:  Procedure Laterality Date  . CHOLECYSTECTOMY    . ICD placement  05/06/2011   [employer] CRT-D  . LUNG SURGERY     23 - 24 yrs. old  . TOTAL KNEE ARTHROPLASTY    . VASECTOMY     Social History:  reports that he quit smoking about 52 years ago. His smoking use included cigarettes. He started smoking about 67 years ago. He has a 6.00 pack-year smoking history. He quit smokeless tobacco use about 52 years ago. He reports that he drinks alcohol. He reports that he does not use drugs.   Family History  Problem Relation Age of Onset  . Diabetes Mother        Bilateral leg amputation  . Heart disease Mother        Before age 40  . Hypertension Mother   . Cancer Father        Prostate and Bone  . Diabetes Father   . Cancer Brother        Prostate  . Heart disease Other        family h/o premature cardiovascular disease  . Cancer Sister        Colon   . Hypertension Sister      Allergies  Allergen Reactions  . Allegra [Fexofenadine]   . Biaxin [Clarithromycin]   . Crestor [Rosuvastatin Calcium]   . Glucophage [Metformin Hcl]   . Trimethoprim   . Codeine Other (See Comments)    blisters, but able to take hydrocodone     Prior to Admission medications   Medication Sig Start Date End Date Taking? Authorizing Provider  allopurinol (ZYLOPRIM) 100 MG tablet Take 100 mg by mouth daily.   Yes [provider]  carvedilol (COREG) 12.5 MG tablet TAKE 1 AND 1/2 TABLETS BY MOUTH TWICE DAILY WITH MEALS (DUE FOR FOLLOW UP) 06/30/17  Yes Satira Sark, MD  ciprofloxacin (CIPRO) 500 MG tablet Take 1 tablet (500 mg total) every 12 (twelve) hours by mouth. 09/24/17  Yes Noemi Chapel, MD  clindamycin (CLEOCIN) 150 MG capsule Take 2 capsules (300 mg total) 3 (three) times daily by mouth. May dispense as 127m capsules  09/24/17  Yes MNoemi Chapel MD  clopidogrel (PLAVIX) 75 MG tablet Take 1 tablet (75 mg total) by mouth daily. 03/25/17  Yes Allred, JJeneen Rinks MD  hydrALAZINE (APRESOLINE) 25 MG tablet TAKE ONE TABLET BY MOUTH THREE TIMES DAILY 07/20/17  Yes MSatira Sark MD  HYDROcodone-acetaminophen (NORCO) 7.5-325 MG tablet Take 1 tablet by mouth 4 (four) times daily as needed for moderate pain.   Yes [provider]  insulin lispro protamine-insulin lispro (HUMALOG 75/25) (75-25) 100 UNIT/ML SUSP Inject 22-35 Units into the skin See admin instructions. Takes 35 units in the morning with breakfast and 35 units at supper (Will take 22-25 at lunch time occasionally)   Yes [provider]  isosorbide mononitrate (IMDUR) 30 MG 24 hr tablet TAKE ONE TABLET BY MOUTH ONCE DAILY 05/28/17  Yes MSatira Sark MD  levothyroxine (  SYNTHROID, LEVOTHROID) 50 MCG tablet Take 50 mcg by mouth daily. 06/29/17  Yes [provider]  lisinopril (PRINIVIL,ZESTRIL) 2.5 MG tablet Take 2.5 mg by mouth daily.   Yes [provider]  loratadine (CLARITIN) 10 MG tablet Take 10 mg by mouth daily.   Yes [provider]  nitroGLYCERIN (NITROSTAT) 0.4 MG SL tablet Place 0.4 mg under the tongue every 5 (five) minutes as needed for chest pain. For chest pain 04/26/13  Yes Serpe, Burna Forts, PA-C  sulfaSALAzine (AZULFIDINE EN-TABS) 500 MG EC tablet Take 1 tablet (500 mg total) by mouth 2 (two) times daily. 03/30/17  Yes Deveshwar, Abel Presto, MD  tamsulosin (FLOMAX) 0.4 MG CAPS capsule Take 0.4 mg by mouth.   Yes [provider]  torsemide (DEMADEX) 10 MG tablet Take 1 tablet (10 mg total) by mouth 2 (two) times daily. 12/30/15  Yes Satira Sark, MD  warfarin (COUMADIN) 5 MG tablet Take 0.5-1 tablets (2.5-5 mg total) by mouth daily at 6 PM. Takes 2.50m on Sunday, Monday, Wednesday and Friday  Takes 558mon Tuesday, Thursday and Sunday 03/19/17  Yes Allred, JaJeneen RinksMD  Zinc 50 MG CAPS Take 50 mg by  mouth daily.    Yes [provider]  doxycycline (VIBRA-TABS) 100 MG tablet Take 1 tablet (100 mg total) by mouth 2 (two) times daily. Patient not taking: Reported on 09/27/2017 08/04/17   MaGardiner BarefootDPM    Review of Systems:  Constitutional:  No weight loss, night sweats, Fevers, chills, fatigue.  Head&Eyes: No headache.  No vision loss.  No eye pain or scotoma ENT:  No Difficulty swallowing,Tooth/dental problems,Sore throat,  No ear ache, post nasal drip,  Cardio-vascular:  No chest pain, Orthopnea, PND, swelling in lower extremities,  dizziness, palpitations  GI:  No  abdominal pain, nausea, vomiting, diarrhea, loss of appetite, hematochezia, melena, heartburn, indigestion, Resp:  No shortness of breath with exertion or at rest. No cough. No coughing up of blood .No wheezing.No chest wall deformity  Skin:  no rash or lesions.  GU:  no dysuria, change in color of urine, no urgency or frequency. No flank pain.  Musculoskeletal:  No joint pain or swelling. No decreased range of motion. No back pain.  Psych:  No change in mood or affect. No depression or anxiety. Neurologic: No headache, no dysesthesia, no focal weakness, no vision loss. No syncope  Physical Exam: Vitals:   09/27/17 1412 09/27/17 1415 09/27/17 1430 09/27/17 1445  BP: 132/64  111/87   Pulse: (!) 55 (!) 58 66 61  Resp: 18     Temp:      TempSrc:      SpO2: 93% 94% 90% 97%  Weight:      Height:       General:  A&O x 3, NAD, nontoxic, pleasant/cooperative Head/Eye: No conjunctival hemorrhage, no icterus, [place]/AT, No nystagmus ENT:  No icterus,  No thrush, good dentition, no pharyngeal exudate Neck:  No masses, no lymphadenpathy, no bruits CV:  RRR, no rub, no gallop, no S3 Lung:  CTAB, good air movement, no wheeze, no rhonchi Abdomen: soft/NT, +BS, nondistended, no peritoneal signs Ext: see pictures below of left foot        Labs on Admission:  Basic Metabolic Panel: Recent Labs  Lab  09/24/17 0838 09/27/17 1147  NA 132* 130*  K 4.7 5.0  CL 99* 99*  CO2 24 21*  GLUCOSE 175* 213*  BUN 45* 50*  CREATININE 1.60* 2.10*  CALCIUM 9.3 9.0  Liver Function Tests: No results for input(s): AST, ALT, ALKPHOS, BILITOT, PROT, ALBUMIN in the last 168 hours. No results for input(s): LIPASE, AMYLASE in the last 168 hours. No results for input(s): AMMONIA in the last 168 hours. CBC: Recent Labs  Lab 09/24/17 0838 09/27/17 1147  WBC 11.9* 11.1*  NEUTROABS 9.7* 9.0*  HGB 13.7 12.3*  HCT 41.5 38.0*  MCV 96.7 98.4  PLT 161 209   Coagulation Profile: Recent Labs  Lab 09/27/17 1309  INR 1.85   Cardiac Enzymes: No results for input(s): CKTOTAL, CKMB, CKMBINDEX, TROPONINI in the last 168 hours. BNP: Invalid input(s): POCBNP CBG: Recent Labs  Lab 09/24/17 0755  GLUCAP 178*   Urine analysis:    Component Value Date/Time   COLORURINE YELLOW 07/05/2014 2117   APPEARANCEUR CLEAR 07/05/2014 2117   LABSPEC 1.017 07/05/2014 2117   PHURINE 5.0 07/05/2014 2117   GLUCOSEU NEGATIVE 07/05/2014 2117   HGBUR NEGATIVE 07/05/2014 2117   BILIRUBINUR NEGATIVE 07/05/2014 2117   [place] NEGATIVE 07/05/2014 2117   PROTEINUR 30 (A) 07/05/2014 2117   UROBILINOGEN 0.2 07/05/2014 2117   NITRITE NEGATIVE 07/05/2014 2117   LEUKOCYTESUR SMALL (A) 07/05/2014 2117   Sepsis Labs: _0 (procalcitonin:4,lacticidven:4) ) Recent Results (from the past 240 hour(s))  Culture, blood (Routine X 2) w Reflex to ID Panel     Status: None (Preliminary result)   Collection Time: 09/27/17  1:09 PM  Result Value Ref Range Status   Specimen Description BLOOD RIGHT ARM  Final   Special Requests   Final    BOTTLES DRAWN AEROBIC AND ANAEROBIC Blood Culture adequate volume   Culture PENDING  Incomplete   Report Status PENDING  Incomplete  Culture, blood (Routine X 2) w Reflex to ID Panel     Status: None (Preliminary result)   Collection Time: 09/27/17  1:23 PM  Result Value Ref Range Status     Specimen Description BLOOD RIGHT HAND  Final   Special Requests   Final    BOTTLES DRAWN AEROBIC AND ANAEROBIC Blood Culture adequate volume   Culture PENDING  Incomplete   Report Status PENDING  Incomplete     Radiological Exams on Admission: No results found.      Time spent:60 minutes Code Status:   FULL Family Communication:  No Family at bedside Disposition Plan: expect 2-3 day hospitalization Consults called: none DVT Prophylaxis: [place] Heparin   Lauree Yurick, DO  [facility] Pager [phone]  If 7PM-7AM, please contact night-coverage [email or web address] Password [email or web address] 09/27/2017, 3:33 PM

## 2017-09-27 NOTE — Progress Notes (Signed)
Pharmacy Antibiotic Note  [name] is a 76 y.o. male admitted on 09/27/2017 with wound infection.  Pharmacy has been consulted for Vancomycin and Cefepime dosing.  Plan: Total 2gm IV Vancomycin load. Vancomycin 1500mg  IV every 48 hours.  Goal trough 10-15 mcg/mL.  Cefepime 2gm IV every 24 hours. Monitor labs, micro and vitals.   Height: 5\' 8"  (172.7 cm) Weight: 250 lb (113.4 kg) IBW/kg (Calculated) : 68.4  Temp (24hrs), Avg:98.2 F (36.8 C), Min:98.2 F (36.8 C), Max:98.2 F (36.8 C)  Recent Labs  Lab 09/24/17 0838 09/27/17 1147  WBC 11.9* 11.1*  CREATININE 1.60* 2.10*    Estimated Creatinine Clearance: 36 mL/min (A) (by C-G formula based on SCr of 2.1 mg/dL (H)).    Allergies  Allergen Reactions  . Allegra [Fexofenadine]   . Biaxin [Clarithromycin]   . Crestor [Rosuvastatin Calcium]   . Glucophage [Metformin Hcl]   . Trimethoprim   . Codeine Other (See Comments)    blisters, but able to take hydrocodone   Antimicrobials this admission: Vanc 11/12 >>  Cefepime 11/12 >>  Zosyn x 1 11/12  Dose adjustments this admission: Obesity/Normalized CrCl Vancomycin dosing protocol will be initiated with an estimated normalized CrCl = 29.6 ml/min.   Microbiology results: 11/12 BCx: pending  UCx:    Sputum:    MRSA PCR:   Thank you for allowing pharmacy to be a part of this patient's care.  13/12 09/27/2017 6:09 PM

## 2017-09-27 NOTE — ED Provider Notes (Signed)
[facility] EMERGENCY DEPARTMENT Provider Note   CSN: [id number] Arrival date & time: 09/27/17  1048     History   Chief Complaint Chief Complaint  Patient presents with  . Wound Infection    HPI [name] is a 76 y.o. male.  HPI Patient was seen in the emergency department 3 days ago for wound infection to the left foot.  He states he stepped on a nail 2 weeks ago.  He was placed on clindamycin and ciprofloxacin.  States his been compliant with medications.  Has had increased swelling pain and redness to the foot over the weekend.  States he is having streaking up the medial surface of his left leg.  Denies any fever or chills.  Tetanus booster was updated 3 days ago. Past Medical History:  Diagnosis Date  . Anemia   . Anemia-chronic    a. Pt reports h/o anemia - was offered IV iron in past by nephrologist but declined and has taken PO instead.  . Aneurysm, cerebral   . BPH (benign prostatic hypertrophy)   . Cardiomyopathy, ischemic    LVEF 25-35%.  . Chronic kidney disease, stage III (moderate) (HCC)    Followed by Dr. Fausto Skillern.  . Coronary atherosclerosis of native coronary artery    a. DES to RCA 03/2009. b. s/p PTCA to RCA for ISR, 05/2010. c. 03/2013 NSTEMI 2/2 severe prox RCA s/p PTCA/DES, med rx for residual dz.  . Essential hypertension, benign   . Hyperlipidemia   . LBBB (left bundle branch block)    s/p BiV ICD Implanted by Dr Graciela Husbands  . Morbid obesity (HCC)   . MRSA (methicillin resistant Staphylococcus aureus)   . Myocardial infarction (HCC)   . Paroxysmal atrial fibrillation (HCC)    a. Coumadin discontinue in 2010 when the patient required ASA/Plavix for stenting. b. Coumadin restarted 05/2013, Plavix continued, ASA stopped.   . Pulmonary nodule    CXR, 03/2013, [facility] - not described on any subsequent chest x-rays however, could be a vascular structure  . TIA (transient ischemic attack)    Multiple TIAs  . Type 2 diabetes mellitus [facility])     Patient Active  Problem List   Diagnosis Date Noted  . Primary osteoarthritis of both hands 06/28/2017  . Primary osteoarthritis of both feet 06/28/2017  . Rheumatoid arthritis involving multiple sites with positive rheumatoid factor (HCC) 03/27/2017  . Pain in both hands 03/24/2017  . H/O total knee replacement, bilateral 03/24/2017  . Pain in both feet 03/24/2017  . High risk medication use 03/24/2017  . Other fatigue 03/24/2017  . History of hypothyroidism 03/24/2017  . Former smoker 03/24/2017  . PVD (peripheral vascular disease) with claudication (HCC) 07/11/2014  . TIA (transient ischemic attack) 05/30/2013  . Long term (current) use of anticoagulants 05/30/2013  . CKD (chronic kidney disease) stage 3, GFR 30-59 ml/min (HCC) 05/29/2013  . Paroxysmal atrial fibrillation (HCC)   . Cardiomyopathy, ischemic   . Biventricular ICD (implantable cardioverter-defibrillator) in place 08/17/2012  . DM 05/15/2010  . Hypertensive cardiovascular disease 04/25/2010  . HYPERLIPIDEMIA-MIXED 08/28/2009  . CAD, NATIVE VESSEL 08/28/2009  . LBBB 08/28/2009  . SYSTOLIC HEART FAILURE, CHRONIC 08/28/2009    Past Surgical History:  Procedure Laterality Date  . CHOLECYSTECTOMY    . ICD placement  05/06/2011   [employer] CRT-D  . LUNG SURGERY     23 - 24 yrs. old  . TOTAL KNEE ARTHROPLASTY    . VASECTOMY  Home Medications    Prior to Admission medications   Medication Sig Start Date End Date Taking? Authorizing Provider  allopurinol (ZYLOPRIM) 100 MG tablet Take 100 mg by mouth daily.   Yes [provider]  carvedilol (COREG) 12.5 MG tablet TAKE 1 AND 1/2 TABLETS BY MOUTH TWICE DAILY WITH MEALS (DUE FOR FOLLOW UP) 06/30/17  Yes Jonelle Sidle, MD  ciprofloxacin (CIPRO) 500 MG tablet Take 1 tablet (500 mg total) every 12 (twelve) hours by mouth. 09/24/17  Yes Eber Hong, MD  clindamycin (CLEOCIN) 150 MG capsule Take 2 capsules (300 mg total) 3 (three) times daily by mouth. May dispense  as 150mg  capsules 09/24/17  Yes 13/9/18, MD  clopidogrel (PLAVIX) 75 MG tablet Take 1 tablet (75 mg total) by mouth daily. 03/25/17  Yes Allred, 05/25/17, MD  hydrALAZINE (APRESOLINE) 25 MG tablet TAKE ONE TABLET BY MOUTH THREE TIMES DAILY 07/20/17  Yes 09/19/17, MD  HYDROcodone-acetaminophen (NORCO) 7.5-325 MG tablet Take 1 tablet by mouth 4 (four) times daily as needed for moderate pain.   Yes [provider]  insulin lispro protamine-insulin lispro (HUMALOG 75/25) (75-25) 100 UNIT/ML SUSP Inject 22-35 Units into the skin See admin instructions. Takes 35 units in the morning with breakfast and 35 units at supper (Will take 22-25 at lunch time occasionally)   Yes [provider]  isosorbide mononitrate (IMDUR) 30 MG 24 hr tablet TAKE ONE TABLET BY MOUTH ONCE DAILY 05/28/17  Yes 05/30/17, MD  levothyroxine (SYNTHROID, LEVOTHROID) 50 MCG tablet Take 50 mcg by mouth daily. 06/29/17  Yes [provider]  lisinopril (PRINIVIL,ZESTRIL) 2.5 MG tablet Take 2.5 mg by mouth daily.   Yes [provider]  loratadine (CLARITIN) 10 MG tablet Take 10 mg by mouth daily.   Yes [provider]  nitroGLYCERIN (NITROSTAT) 0.4 MG SL tablet Place 0.4 mg under the tongue every 5 (five) minutes as needed for chest pain. For chest pain 04/26/13  Yes Serpe, 06/26/13, PA-C  sulfaSALAzine (AZULFIDINE EN-TABS) 500 MG EC tablet Take 1 tablet (500 mg total) by mouth 2 (two) times daily. 03/30/17  Yes Deveshwar, 04/01/17, MD  tamsulosin (FLOMAX) 0.4 MG CAPS capsule Take 0.4 mg by mouth.   Yes [provider]  torsemide (DEMADEX) 10 MG tablet Take 1 tablet (10 mg total) by mouth 2 (two) times daily. 12/30/15  Yes 01/01/16, MD  warfarin (COUMADIN) 5 MG tablet Take 0.5-1 tablets (2.5-5 mg total) by mouth daily at 6 PM. Takes 2.5mg  on Sunday, Monday, Wednesday and Friday  Takes 5mg  on Tuesday, Thursday and Sunday 03/19/17  Yes Allred, Friday, MD  Zinc 50 MG CAPS  Take 50 mg by mouth daily.    Yes [provider]  doxycycline (VIBRA-TABS) 100 MG tablet Take 1 tablet (100 mg total) by mouth 2 (two) times daily. Patient not taking: Reported on 09/27/2017 08/04/17   13/10/2017, DPM    Family History Family History  Problem Relation Age of Onset  . Diabetes Mother        Bilateral leg amputation  . Heart disease Mother        Before age 38  . Hypertension Mother   . Cancer Father        Prostate and Bone  . Diabetes Father   . Cancer Brother        Prostate  . Heart disease Other        family h/o premature cardiovascular disease  . Cancer  Sister        Colon   . Hypertension Sister     Social History Social History   Tobacco Use  . Smoking status: Former Smoker    Packs/day: 1.00    Years: 6.00    Pack years: 6.00    Types: Cigarettes    Start date: 12/08/1949    Last attempt to quit: 11/16/1964    Years since quitting: 52.8  . Smokeless tobacco: Former Neurosurgeon    Quit date: 04/15/1965  Substance Use Topics  . Alcohol use: Yes    Alcohol/week: 0.0 oz    Comment: once/month  . Drug use: No     Allergies   Allegra [fexofenadine]; Biaxin [clarithromycin]; Crestor [rosuvastatin calcium]; Glucophage [metformin hcl]; Trimethoprim; and Codeine   Review of Systems Review of Systems  Constitutional: Negative for chills and fever.  Respiratory: Negative for shortness of breath.   Cardiovascular: Negative for chest pain.  Gastrointestinal: Negative for abdominal pain, nausea and vomiting.  Musculoskeletal: Negative for arthralgias and neck pain.  Skin: Positive for color change and wound.  Neurological: Negative for dizziness, weakness, light-headedness, numbness and headaches.  All other systems reviewed and are negative.    Physical Exam Updated Vital Signs BP 111/87   Pulse 66   Temp 98.2 F (36.8 C) (Oral)   Resp 18   Ht 5\' 8"  (1.727 m)   Wt 113.4 kg (250 lb)   SpO2 90%   BMI 38.01 kg/m   Physical Exam    Constitutional: He is oriented to person, place, and time. He appears well-developed and well-nourished. No distress.  HENT:  Head: Normocephalic and atraumatic.  Dry mucous membranes  Eyes: EOM are normal. Pupils are equal, round, and reactive to light.  Neck: Normal range of motion. Neck supple.  Cardiovascular: Normal rate and regular rhythm.  Pulmonary/Chest: Effort normal and breath sounds normal.  Abdominal: Soft. Bowel sounds are normal. There is no tenderness. There is no rebound and no guarding.  Musculoskeletal: Normal range of motion. He exhibits edema and tenderness.  Patient with puncture wound to the plantar surface of the left foot right at the first MTP joint.  There is surrounding swelling and erythema extending up to the dorsal surface of the foot with streaking up the medial surface of the left ankle.  No purulent drainage or areas of fluctuance.  Neurological: He is alert and oriented to person, place, and time.  Moving all extremities without focal deficit.  Sensation intact.  Skin: Skin is warm and dry. Capillary refill takes less than 2 seconds. No rash noted. No erythema.  Psychiatric: He has a normal mood and affect. His behavior is normal.  Nursing note and vitals reviewed.    ED Treatments / Results  Labs (all labs ordered are listed, but only abnormal results are displayed) Labs Reviewed  CBC WITH DIFFERENTIAL/PLATELET - Abnormal; Notable for the following components:      Result Value   WBC 11.1 (*)    RBC 3.86 (*)    Hemoglobin 12.3 (*)    HCT 38.0 (*)    Neutro Abs 9.0 (*)    Lymphs Abs 0.6 (*)    Monocytes Absolute 1.2 (*)    All other components within normal limits  BASIC METABOLIC PANEL - Abnormal; Notable for the following components:   Sodium 130 (*)    Chloride 99 (*)    CO2 21 (*)    Glucose, Bld 213 (*)    BUN 50 (*)  Creatinine, Ser 2.10 (*)    GFR calc non Af Amer 29 (*)    GFR calc Af Amer 33 (*)    All other components within  normal limits  PROTIME-INR - Abnormal; Notable for the following components:   Prothrombin Time 21.2 (*)    All other components within normal limits  CULTURE, BLOOD (ROUTINE X 2)  CULTURE, BLOOD (ROUTINE X 2)  SEDIMENTATION RATE  C-REACTIVE PROTEIN    EKG  EKG Interpretation None       Radiology No results found.  Procedures Procedures (including critical care time)  Medications Ordered in ED Medications  vancomycin (VANCOCIN) IVPB 1000 mg/200 mL premix (1,000 mg Intravenous New Bag/Given 09/27/17 1332)  piperacillin-tazobactam (ZOSYN) IVPB 3.375 g (0 g Intravenous Stopped 09/27/17 1405)  sodium chloride 0.9 % bolus 500 mL (500 mLs Intravenous New Bag/Given 09/27/17 1300)     Initial Impression / Assessment and Plan / ED Course  I have reviewed the triage vital signs and the nursing notes.  Pertinent labs & imaging results that were available during my care of the patient were reviewed by me and considered in my medical decision making (see chart for details).    Started on IV vancomycin and Zosyn. Discussed with hospitalist who will admit.   Final Clinical Impressions(s) / ED Diagnoses   Final diagnoses:  Cellulitis of left foot    ED Discharge Orders    None       Loren Racer, MD 09/27/17 1456

## 2017-09-27 NOTE — Progress Notes (Signed)
ANTICOAGULATION CONSULT NOTE - Initial Consult  Pharmacy Consult for Warfarin Indication: atrial fibrillation  Allergies  Allergen Reactions  . Allegra [Fexofenadine]   . Biaxin [Clarithromycin]   . Crestor [Rosuvastatin Calcium]   . Glucophage [Metformin Hcl]   . Trimethoprim   . Codeine Other (See Comments)    blisters, but able to take hydrocodone   Patient Measurements: Height: 5\' 8"  (172.7 cm) Weight: 250 lb (113.4 kg) IBW/kg (Calculated) : 68.4  Vital Signs: Temp: 98.2 F (36.8 C) (11/12 1059) Temp Source: Oral (11/12 1059) BP: 147/80 (11/12 1630) Pulse Rate: 58 (11/12 1630)  Labs: Recent Labs    09/27/17 1147 09/27/17 1309  HGB 12.3*  --   HCT 38.0*  --   PLT 209  --   LABPROT  --  21.2*  INR  --  1.85  CREATININE 2.10*  --     Estimated Creatinine Clearance: 36 mL/min (A) (by C-G formula based on SCr of 2.1 mg/dL (H)).   Medical History: Past Medical History:  Diagnosis Date  . Anemia   . Anemia-chronic    a. Pt reports h/o anemia - was offered IV iron in past by nephrologist but declined and has taken PO instead.  . Aneurysm, cerebral   . BPH (benign prostatic hypertrophy)   . Cardiomyopathy, ischemic    LVEF 25-35%.  . Chronic kidney disease, stage III (moderate) (HCC)    Followed by Dr. 13/12/18.  . Coronary atherosclerosis of native coronary artery    a. DES to RCA 03/2009. b. s/p PTCA to RCA for ISR, 05/2010. c. 03/2013 NSTEMI 2/2 severe prox RCA s/p PTCA/DES, med rx for residual dz.  . Essential hypertension, benign   . Hyperlipidemia   . LBBB (left bundle branch block)    s/p BiV ICD Implanted by Dr 04/2013  . Morbid obesity (HCC)   . MRSA (methicillin resistant Staphylococcus aureus)   . Myocardial infarction (HCC)   . Paroxysmal atrial fibrillation (HCC)    a. Coumadin discontinue in 2010 when the patient required ASA/Plavix for stenting. b. Coumadin restarted 05/2013, Plavix continued, ASA stopped.   . Pulmonary nodule    CXR, 03/2013, [facility]  - not described on any subsequent chest x-rays however, could be a vascular structure  . TIA (transient ischemic attack)    Multiple TIAs  . Type 2 diabetes mellitus (HCC)     Medications:  Medications Prior to Admission  Medication Sig Dispense Refill Last Dose  . allopurinol (ZYLOPRIM) 100 MG tablet Take 100 mg by mouth daily.   09/27/2017 at Unknown time  . carvedilol (COREG) 12.5 MG tablet TAKE 1 AND 1/2 TABLETS BY MOUTH TWICE DAILY WITH MEALS (DUE FOR FOLLOW UP) 270 tablet 1 09/27/2017 at 700  . ciprofloxacin (CIPRO) 500 MG tablet Take 1 tablet (500 mg total) every 12 (twelve) hours by mouth. 20 tablet 0 09/27/2017 at Unknown time  . clindamycin (CLEOCIN) 150 MG capsule Take 2 capsules (300 mg total) 3 (three) times daily by mouth. May dispense as 150mg  capsules 60 capsule 0 09/27/2017 at Unknown time  . clopidogrel (PLAVIX) 75 MG tablet Take 1 tablet (75 mg total) by mouth daily. 30 tablet 6 09/27/2017 at 700  . hydrALAZINE (APRESOLINE) 25 MG tablet TAKE ONE TABLET BY MOUTH THREE TIMES DAILY 270 tablet 1 09/27/2017 at Unknown time  . HYDROcodone-acetaminophen (NORCO) 7.5-325 MG tablet Take 1 tablet by mouth 4 (four) times daily as needed for moderate pain.   09/27/2017 at Unknown time  . insulin lispro  protamine-insulin lispro (HUMALOG 75/25) (75-25) 100 UNIT/ML SUSP Inject 22-35 Units into the skin See admin instructions. Takes 35 units in the morning with breakfast and 35 units at supper (Will take 22-25 at lunch time occasionally)   09/27/2017 at Unknown time  . isosorbide mononitrate (IMDUR) 30 MG 24 hr tablet TAKE ONE TABLET BY MOUTH ONCE DAILY 90 tablet 3 09/27/2017 at Unknown time  . levothyroxine (SYNTHROID, LEVOTHROID) 50 MCG tablet Take 50 mcg by mouth daily.   09/27/2017 at Unknown time  . lisinopril (PRINIVIL,ZESTRIL) 2.5 MG tablet Take 2.5 mg by mouth daily.   09/27/2017 at Unknown time  . loratadine (CLARITIN) 10 MG tablet Take 10 mg by mouth daily.   Taking  . nitroGLYCERIN  (NITROSTAT) 0.4 MG SL tablet Place 0.4 mg under the tongue every 5 (five) minutes as needed for chest pain. For chest pain   Taking  . sulfaSALAzine (AZULFIDINE EN-TABS) 500 MG EC tablet Take 1 tablet (500 mg total) by mouth 2 (two) times daily. 60 tablet 2 09/27/2017 at Unknown time  . tamsulosin (FLOMAX) 0.4 MG CAPS capsule Take 0.4 mg by mouth.   09/27/2017 at Unknown time  . torsemide (DEMADEX) 10 MG tablet Take 1 tablet (10 mg total) by mouth 2 (two) times daily. 60 tablet 3 09/27/2017 at Unknown time  . warfarin (COUMADIN) 5 MG tablet Take 0.5-1 tablets (2.5-5 mg total) by mouth daily at 6 PM. Takes 2.5mg  on Sunday, Monday, Wednesday and Friday  Takes 5mg  on Tuesday, Thursday and Sunday   09/27/2017 at 700  . Zinc 50 MG CAPS Take 50 mg by mouth daily.    09/27/2017 at Unknown time  . doxycycline (VIBRA-TABS) 100 MG tablet Take 1 tablet (100 mg total) by mouth 2 (two) times daily. (Patient not taking: Reported on 09/27/2017) 20 tablet 0 Completed Course at Unknown time    Assessment: Okay for Protocol, INR below goal.  Dose given today per home med list.  Goal of Therapy:  INR 2-3   Plan:  Additional 2.5mg  PO x 1 tonight. Daily PT/INR. Monitor for signs and symptoms of bleeding.   13/10/2017 R 09/27/2017,5:59 PM

## 2017-09-27 NOTE — ED Triage Notes (Signed)
Seen here Friday for cellulitis right foot. Pt states his right foot is getting worse.

## 2017-09-27 NOTE — ED Notes (Signed)
Confirmed with lab that both blood cultures done prior to IV antibiotics

## 2017-09-28 ENCOUNTER — Inpatient Hospital Stay (HOSPITAL_BASED_OUTPATIENT_CLINIC_OR_DEPARTMENT_OTHER): Payer: PPO

## 2017-09-28 ENCOUNTER — Inpatient Hospital Stay (HOSPITAL_COMMUNITY): Payer: PPO

## 2017-09-28 DIAGNOSIS — I110 Hypertensive heart disease with heart failure: Secondary | ICD-10-CM

## 2017-09-28 DIAGNOSIS — I5032 Chronic diastolic (congestive) heart failure: Secondary | ICD-10-CM

## 2017-09-28 LAB — GLUCOSE, CAPILLARY
GLUCOSE-CAPILLARY: 157 mg/dL — AB (ref 65–99)
GLUCOSE-CAPILLARY: 151 mg/dL — AB (ref 65–99)
GLUCOSE-CAPILLARY: 218 mg/dL — AB (ref 65–99)
Glucose-Capillary: 242 mg/dL — ABNORMAL HIGH (ref 65–99)

## 2017-09-28 LAB — HEMOGLOBIN A1C
HEMOGLOBIN A1C: 7.8 % — AB (ref 4.8–5.6)
MEAN PLASMA GLUCOSE: 177.16 mg/dL

## 2017-09-28 LAB — CBC
HCT: 40.1 % (ref 39.0–52.0)
HEMOGLOBIN: 13.2 g/dL (ref 13.0–17.0)
MCH: 32.7 pg (ref 26.0–34.0)
MCHC: 32.9 g/dL (ref 30.0–36.0)
MCV: 99.3 fL (ref 78.0–100.0)
PLATELETS: 225 10*3/uL (ref 150–400)
RBC: 4.04 MIL/uL — AB (ref 4.22–5.81)
RDW: 14.6 % (ref 11.5–15.5)
WBC: 9.1 10*3/uL (ref 4.0–10.5)

## 2017-09-28 LAB — BASIC METABOLIC PANEL
ANION GAP: 9 (ref 5–15)
BUN: 45 mg/dL — ABNORMAL HIGH (ref 6–20)
CALCIUM: 9 mg/dL (ref 8.9–10.3)
CO2: 25 mmol/L (ref 22–32)
CREATININE: 1.86 mg/dL — AB (ref 0.61–1.24)
Chloride: 98 mmol/L — ABNORMAL LOW (ref 101–111)
GFR calc non Af Amer: 33 mL/min — ABNORMAL LOW (ref >60)
GFR, EST AFRICAN AMERICAN: 39 mL/min — AB (ref >60)
Glucose, Bld: 156 mg/dL — ABNORMAL HIGH (ref 65–99)
Potassium: 4.8 mmol/L (ref 3.5–5.1)
SODIUM: 132 mmol/L — AB (ref 135–145)

## 2017-09-28 LAB — PROTIME-INR
INR: 1.59
PROTHROMBIN TIME: 18.8 s — AB (ref 11.4–15.2)

## 2017-09-28 MED ORDER — HYDROXYZINE HCL 10 MG PO TABS
10.0000 mg | ORAL_TABLET | Freq: Three times a day (TID) | ORAL | Status: DC | PRN
  Administered 2017-09-28 – 2017-10-01 (×3): 10 mg via ORAL
  Filled 2017-09-28 (×4): qty 1

## 2017-09-28 MED ORDER — INSULIN ASPART PROT & ASPART (70-30 MIX) 100 UNIT/ML ~~LOC~~ SUSP
30.0000 [IU] | Freq: Two times a day (BID) | SUBCUTANEOUS | Status: DC
  Administered 2017-09-29 (×2): 30 [IU] via SUBCUTANEOUS
  Filled 2017-09-28: qty 10

## 2017-09-28 MED ORDER — VANCOMYCIN HCL 10 G IV SOLR
1250.0000 mg | INTRAVENOUS | Status: DC
  Filled 2017-09-28: qty 1250

## 2017-09-28 MED ORDER — DOXYCYCLINE HYCLATE 100 MG PO TABS
100.0000 mg | ORAL_TABLET | Freq: Two times a day (BID) | ORAL | Status: DC
  Administered 2017-09-28 – 2017-09-29 (×2): 100 mg via ORAL
  Filled 2017-09-28 (×2): qty 1

## 2017-09-28 MED ORDER — IOPAMIDOL (ISOVUE-300) INJECTION 61%
75.0000 mL | Freq: Once | INTRAVENOUS | Status: AC | PRN
  Administered 2017-09-28: 75 mL via INTRAVENOUS

## 2017-09-28 MED ORDER — WARFARIN SODIUM 7.5 MG PO TABS
7.5000 mg | ORAL_TABLET | Freq: Once | ORAL | Status: AC
  Administered 2017-09-28: 7.5 mg via ORAL
  Filled 2017-09-28: qty 1

## 2017-09-28 NOTE — Progress Notes (Signed)
ANTICOAGULATION CONSULT NOTE  Pharmacy Consult for Warfarin Indication: atrial fibrillation  Allergies  Allergen Reactions  . Allegra [Fexofenadine]   . Biaxin [Clarithromycin]   . Crestor [Rosuvastatin Calcium]   . Glucophage [Metformin Hcl]   . Trimethoprim   . Codeine Other (See Comments)    blisters, but able to take hydrocodone   Patient Measurements: Height: 5\' 8"  (172.7 cm) Weight: 248 lb 14.4 oz (112.9 kg) IBW/kg (Calculated) : 68.4  Vital Signs: Temp: 98.3 F (36.8 C) (11/13 0610) Temp Source: Oral (11/13 0610) BP: 164/56 (11/13 0610) Pulse Rate: 72 (11/13 0610)  Labs: Recent Labs    09/27/17 1147 09/27/17 1309 09/28/17 0506  HGB 12.3*  --  13.2  HCT 38.0*  --  40.1  PLT 209  --  225  LABPROT  --  21.2* 18.8*  INR  --  1.85 1.59  CREATININE 2.10*  --  1.86*   Estimated Creatinine Clearance: 40.6 mL/min (A) (by C-G formula based on SCr of 1.86 mg/dL (H)).  Medical History: Past Medical History:  Diagnosis Date  . Anemia   . Anemia-chronic    a. Pt reports h/o anemia - was offered IV iron in past by nephrologist but declined and has taken PO instead.  . Aneurysm, cerebral   . BPH (benign prostatic hypertrophy)   . Cardiomyopathy, ischemic    LVEF 25-35%.  . Chronic kidney disease, stage III (moderate) (HCC)    Followed by Dr. 09/30/17.  . Coronary atherosclerosis of native coronary artery    a. DES to RCA 03/2009. b. s/p PTCA to RCA for ISR, 05/2010. c. 03/2013 NSTEMI 2/2 severe prox RCA s/p PTCA/DES, med rx for residual dz.  . Essential hypertension, benign   . Hyperlipidemia   . LBBB (left bundle branch block)    s/p BiV ICD Implanted by Dr 04/2013  . Morbid obesity (HCC)   . MRSA (methicillin resistant Staphylococcus aureus)   . Myocardial infarction (HCC)   . Paroxysmal atrial fibrillation (HCC)    a. Coumadin discontinue in 2010 when the patient required ASA/Plavix for stenting. b. Coumadin restarted 05/2013, Plavix continued, ASA stopped.   .  Pulmonary nodule    CXR, 03/2013, [facility] - not described on any subsequent chest x-rays however, could be a vascular structure  . TIA (transient ischemic attack)    Multiple TIAs  . Type 2 diabetes mellitus (HCC)    Medications:  Medications Prior to Admission  Medication Sig Dispense Refill Last Dose  . allopurinol (ZYLOPRIM) 100 MG tablet Take 100 mg by mouth daily.   09/27/2017 at Unknown time  . carvedilol (COREG) 12.5 MG tablet TAKE 1 AND 1/2 TABLETS BY MOUTH TWICE DAILY WITH MEALS (DUE FOR FOLLOW UP) 270 tablet 1 09/27/2017 at 700  . ciprofloxacin (CIPRO) 500 MG tablet Take 1 tablet (500 mg total) every 12 (twelve) hours by mouth. 20 tablet 0 09/27/2017 at Unknown time  . clindamycin (CLEOCIN) 150 MG capsule Take 2 capsules (300 mg total) 3 (three) times daily by mouth. May dispense as 150mg  capsules 60 capsule 0 09/27/2017 at Unknown time  . clopidogrel (PLAVIX) 75 MG tablet Take 1 tablet (75 mg total) by mouth daily. 30 tablet 6 09/27/2017 at 700  . hydrALAZINE (APRESOLINE) 25 MG tablet TAKE ONE TABLET BY MOUTH THREE TIMES DAILY 270 tablet 1 09/27/2017 at Unknown time  . HYDROcodone-acetaminophen (NORCO) 7.5-325 MG tablet Take 1 tablet by mouth 4 (four) times daily as needed for moderate pain.   09/27/2017 at Unknown time  .  insulin lispro protamine-insulin lispro (HUMALOG 75/25) (75-25) 100 UNIT/ML SUSP Inject 22-35 Units into the skin See admin instructions. Takes 35 units in the morning with breakfast and 35 units at supper (Will take 22-25 at lunch time occasionally)   09/27/2017 at Unknown time  . isosorbide mononitrate (IMDUR) 30 MG 24 hr tablet TAKE ONE TABLET BY MOUTH ONCE DAILY 90 tablet 3 09/27/2017 at Unknown time  . levothyroxine (SYNTHROID, LEVOTHROID) 50 MCG tablet Take 50 mcg by mouth daily.   09/27/2017 at Unknown time  . lisinopril (PRINIVIL,ZESTRIL) 2.5 MG tablet Take 2.5 mg by mouth daily.   09/27/2017 at Unknown time  . loratadine (CLARITIN) 10 MG tablet Take 10 mg by  mouth daily.   Taking  . nitroGLYCERIN (NITROSTAT) 0.4 MG SL tablet Place 0.4 mg under the tongue every 5 (five) minutes as needed for chest pain. For chest pain   Taking  . sulfaSALAzine (AZULFIDINE EN-TABS) 500 MG EC tablet Take 1 tablet (500 mg total) by mouth 2 (two) times daily. 60 tablet 2 09/27/2017 at Unknown time  . tamsulosin (FLOMAX) 0.4 MG CAPS capsule Take 0.4 mg by mouth.   09/27/2017 at Unknown time  . torsemide (DEMADEX) 10 MG tablet Take 1 tablet (10 mg total) by mouth 2 (two) times daily. 60 tablet 3 09/27/2017 at Unknown time  . warfarin (COUMADIN) 5 MG tablet Take 0.5-1 tablets (2.5-5 mg total) by mouth daily at 6 PM. Takes 2.5mg  on Sunday, Monday, Wednesday and Friday  Takes 5mg  on Tuesday, Thursday and Sunday   09/27/2017 at 700  . Zinc 50 MG CAPS Take 50 mg by mouth daily.    09/27/2017 at Unknown time  . doxycycline (VIBRA-TABS) 100 MG tablet Take 1 tablet (100 mg total) by mouth 2 (two) times daily. (Patient not taking: Reported on 09/27/2017) 20 tablet 0 Completed Course at Unknown time   Assessment: Okay for Protocol, INR remains below goal.  No bleeding noted.  Goal of Therapy:  INR 2-3   Plan:  Warfarin 7.5mg  PO x 1. Daily PT/INR. Monitor for signs and symptoms of bleeding.   13/10/2017 R 09/28/2017,10:45 AM

## 2017-09-28 NOTE — Progress Notes (Signed)
Patient lost IV access earlier today due to site leaking. Unable to obtain new peripheral IV access after two RNs attempted. Notified MD to see if PICC line needed. Also notified MD patient did not eat supper and insulin 70/30 held per orders to hold if eating less than 50% of meal. Order for PICC line placed by MD. Nursing will notify vascular access of order. Earnstine Regal, RN

## 2017-09-28 NOTE — Progress Notes (Signed)
Pharmacy Antibiotic Note  [name] is a 76 y.o. male admitted on 09/27/2017 with wound infection.  Pharmacy has been consulted for Vancomycin and Cefepime dosing.  Renal function improving.  Plan: Change Vancomycin to 1250mg  IV every 24 hours.  Goal trough 10-15 mcg/mL.  Continue Cefepime 2gm IV every 24 hours. Monitor labs, micro and vitals.  Height: 5\' 8"  (172.7 cm) Weight: 248 lb 14.4 oz (112.9 kg) IBW/kg (Calculated) : 68.4  Temp (24hrs), Avg:98.2 F (36.8 C), Min:98 F (36.7 C), Max:98.3 F (36.8 C)  Recent Labs  Lab 09/24/17 0838 09/27/17 1147 09/28/17 0506  WBC 11.9* 11.1* 9.1  CREATININE 1.60* 2.10* 1.86*    Estimated Creatinine Clearance: 40.6 mL/min (A) (by C-G formula based on SCr of 1.86 mg/dL (H)).    Allergies  Allergen Reactions  . Allegra [Fexofenadine]   . Biaxin [Clarithromycin]   . Crestor [Rosuvastatin Calcium]   . Glucophage [Metformin Hcl]   . Trimethoprim   . Codeine Other (See Comments)    blisters, but able to take hydrocodone   Antimicrobials this admission: Vanc 11/12 >>  Cefepime 11/12 >>  Zosyn x 1 11/12  Dose adjustments this admission: Obesity/Normalized CrCl Vancomycin dosing protocol will be initiated with an estimated normalized CrCl = 33 ml/min.   Microbiology results: 11/12 BCx: pending  UCx:    Sputum:    MRSA PCR:   Thank you for allowing pharmacy to be a part of this patient's care.  13/12 09/28/2017 11:00 AM

## 2017-09-28 NOTE — Progress Notes (Addendum)
09/28/2017 7:05 PM  Pt lost all IV access.  Unable to have PICC line placed.  Will DC IV abx and start oral doxycycline as infection is getting better and no osteomyelitis was found.  [name] Diabetes uncontrolled: - with hyperglycemia

## 2017-09-28 NOTE — Progress Notes (Signed)
PROGRESS NOTE    [name]  INO:[id number]  DOB: 1941/03/03  DOA: 09/27/2017 PCP: [name], MD   Brief Admission Hx: [name] is a 76 y.o. male with medical history of diabetes mellitus, coronary artery disease, paroxysmal atrial fibrillation, CKD stage III, rheumatoid arthritis, ischemic cardiomyopathy status post ICD presented with 3-day history of increasing pain, edema, erythema about his left foot.   MDM/Assessment & Plan:   1. Cellulitis of left foot -patient has responded well to IV antibiotic therapy.  IV antibiotics will be continued.  CT of the foot is pending at this time.  Continue to work on glycemic control.  Further management pending results of CT scan.  Recommended elevating the left foot today.  2. Acute on chronic CKD stage III-creatinine improved with IV fluid hydration.  Continue to monitor.  Holding lisinopril. 3. Coronary artery disease-stable.  Continue carvedilol. 4. Type 2 diabetes mellitus with neurological complications- follow blood glucose, resume home 7030 insulin.  Supplemental sliding scale ordered as needed. 5. Paroxysmal atrial fibrillation-continue warfarin and carvedilol. 6. Ischemic cardiomyopathy-stable on home meds.  Continue hydralazine and Imdur. 7. Rheumatoid arthritis-stable.  Resume home sulfasalazine. 8. Essential hypertension-blood pressures well controlled on home medications which have been continued. 9. Gout - stable on home allopurinol.    DVT prophylaxis: warfarin Code Status: Full  Family Communication: none present at bedside Disposition Plan: home in 2-3 days  Subjective: Patient says he has less pain in left foot today.  Objective: Vitals:   09/27/17 1630 09/27/17 1807 09/27/17 2221 09/28/17 0610  BP: (!) 147/80 (!) 147/89 117/61 (!) 164/56  Pulse: (!) 58 74 73 72  Resp:  18 16 16   Temp:  98 F (36.7 C) 98.3 F (36.8 C) 98.3 F (36.8 C)  TempSrc:  Oral Oral Oral  SpO2: 95% 98% 98% 95%  Weight:  112.9 kg  (248 lb 14.4 oz)    Height:  5\' 8"  (1.727 m)      Intake/Output Summary (Last 24 hours) at 09/28/2017 1008 Last data filed at 09/28/2017 0600 Gross per 24 hour  Intake 1101.25 ml  Output 600 ml  Net 501.25 ml   Filed Weights   09/27/17 1057 09/27/17 1807  Weight: 113.4 kg (250 lb) 112.9 kg (248 lb 14.4 oz)     REVIEW OF SYSTEMS  As per history otherwise all reviewed and reported negative  Exam:  General exam: awake, alert, NAD. Cooperative.   Respiratory system: Clear. No increased work of breathing. Cardiovascular system: S1 & S2 heard. No JVD, murmurs, gallops, clicks.  Gastrointestinal system: Abdomen is nondistended, soft and nontender. Normal bowel sounds heard. Central nervous system: Alert and oriented. No focal neurological deficits. Extremities: left foot with less heat and erythema, edema improving.    Data Reviewed: Basic Metabolic Panel: Recent Labs  Lab 09/24/17 0838 09/27/17 1147 09/28/17 0506  NA 132* 130* 132*  K 4.7 5.0 4.8  CL 99* 99* 98*  CO2 24 21* 25  GLUCOSE 175* 213* 156*  BUN 45* 50* 45*  CREATININE 1.60* 2.10* 1.86*  CALCIUM 9.3 9.0 9.0   Liver Function Tests: No results for input(s): AST, ALT, ALKPHOS, BILITOT, PROT, ALBUMIN in the last 168 hours. No results for input(s): LIPASE, AMYLASE in the last 168 hours. No results for input(s): AMMONIA in the last 168 hours. CBC: Recent Labs  Lab 09/24/17 0838 09/27/17 1147 09/28/17 0506  WBC 11.9* 11.1* 9.1  NEUTROABS 9.7* 9.0*  --   HGB 13.7 12.3* 13.2  HCT 41.5 38.0* 40.1  MCV 96.7 98.4 99.3  PLT 161 209 225   Cardiac Enzymes: No results for input(s): CKTOTAL, CKMB, CKMBINDEX, TROPONINI in the last 168 hours. CBG (last 3)  Recent Labs    09/27/17 2109 09/28/17 0736  GLUCAP 243* 157*   Recent Results (from the past 240 hour(s))  Culture, blood (Routine X 2) w Reflex to ID Panel     Status: None (Preliminary result)   Collection Time: 09/27/17  1:09 PM  Result Value Ref  Range Status   Specimen Description BLOOD RIGHT ARM  Final   Special Requests   Final    BOTTLES DRAWN AEROBIC AND ANAEROBIC Blood Culture adequate volume   Culture NO GROWTH < 24 HOURS  Final   Report Status PENDING  Incomplete  Culture, blood (Routine X 2) w Reflex to ID Panel     Status: None (Preliminary result)   Collection Time: 09/27/17  1:23 PM  Result Value Ref Range Status   Specimen Description BLOOD RIGHT HAND  Final   Special Requests   Final    BOTTLES DRAWN AEROBIC AND ANAEROBIC Blood Culture adequate volume   Culture NO GROWTH < 24 HOURS  Final   Report Status PENDING  Incomplete     Studies: No results found.   Scheduled Meds: . allopurinol  100 mg Oral Daily  . carvedilol  12.5 mg Oral BID WC  . clopidogrel  75 mg Oral Daily  . heparin  5,000 Units Subcutaneous Q8H  . hydrALAZINE  25 mg Oral TID  . insulin aspart  0-15 Units Subcutaneous TID WC  . insulin aspart  0-5 Units Subcutaneous QHS  . isosorbide mononitrate  30 mg Oral Daily  . levothyroxine  50 mcg Oral QAC breakfast  . sulfaSALAzine  500 mg Oral BID  . tamsulosin  0.4 mg Oral QPC supper  . Warfarin - Pharmacist Dosing Inpatient   Does not apply Q24H   Continuous Infusions: . sodium chloride 75 mL/hr at 09/28/17 0907  . ceFEPime (MAXIPIME) IV Stopped (09/27/17 2345)  . [START ON 09/29/2017] vancomycin      Active Problems:   Hypertensive cardiovascular disease   Biventricular ICD (implantable cardioverter-defibrillator) in place   Cardiomyopathy, ischemic   Paroxysmal atrial fibrillation (HCC)   CKD (chronic kidney disease) stage 3, GFR 30-59 ml/min (HCC)   PVD (peripheral vascular disease) with claudication (HCC)   Diabetic foot infection (HCC)  Time spent:   Standley Dakins, MD, FAAFP [facility] Pager [phone]  If 7PM-7AM, please contact night-coverage [email or web address] Password [email or web address] 09/28/2017, 10:08 AM    LOS: 1 day

## 2017-09-28 NOTE — Plan of Care (Signed)
Pt required analgesic related to infection once duration of shift. Tolerated Vancomycin and Zosyn well without signs of distress.

## 2017-09-29 LAB — BASIC METABOLIC PANEL
Anion gap: 12 (ref 5–15)
BUN: 44 mg/dL — ABNORMAL HIGH (ref 6–20)
CHLORIDE: 96 mmol/L — AB (ref 101–111)
CO2: 21 mmol/L — ABNORMAL LOW (ref 22–32)
Calcium: 9.1 mg/dL (ref 8.9–10.3)
Creatinine, Ser: 1.97 mg/dL — ABNORMAL HIGH (ref 0.61–1.24)
GFR calc Af Amer: 36 mL/min — ABNORMAL LOW (ref >60)
GFR calc non Af Amer: 31 mL/min — ABNORMAL LOW (ref >60)
GLUCOSE: 249 mg/dL — AB (ref 65–99)
Potassium: 5.1 mmol/L (ref 3.5–5.1)
Sodium: 129 mmol/L — ABNORMAL LOW (ref 135–145)

## 2017-09-29 LAB — GLUCOSE, CAPILLARY
GLUCOSE-CAPILLARY: 309 mg/dL — AB (ref 65–99)
GLUCOSE-CAPILLARY: 78 mg/dL (ref 65–99)
Glucose-Capillary: 222 mg/dL — ABNORMAL HIGH (ref 65–99)
Glucose-Capillary: 173 mg/dL — ABNORMAL HIGH (ref 65–99)

## 2017-09-29 LAB — CBC
HEMATOCRIT: 40 % (ref 39.0–52.0)
Hemoglobin: 13.3 g/dL (ref 13.0–17.0)
MCH: 32.6 pg (ref 26.0–34.0)
MCHC: 33.3 g/dL (ref 30.0–36.0)
MCV: 98 fL (ref 78.0–100.0)
Platelets: 265 10*3/uL (ref 150–400)
RBC: 4.08 MIL/uL — ABNORMAL LOW (ref 4.22–5.81)
RDW: 14.4 % (ref 11.5–15.5)
WBC: 11.6 10*3/uL — AB (ref 4.0–10.5)

## 2017-09-29 LAB — MAGNESIUM: Magnesium: 2.1 mg/dL (ref 1.7–2.4)

## 2017-09-29 LAB — PROTIME-INR
INR: 1.51
PROTHROMBIN TIME: 18.1 s — AB (ref 11.4–15.2)

## 2017-09-29 MED ORDER — PIPERACILLIN-TAZOBACTAM 3.375 G IVPB
3.3750 g | Freq: Three times a day (TID) | INTRAVENOUS | Status: DC
  Administered 2017-09-29 – 2017-10-08 (×25): 3.375 g via INTRAVENOUS
  Filled 2017-09-29 (×25): qty 50

## 2017-09-29 MED ORDER — WARFARIN SODIUM 5 MG PO TABS
5.0000 mg | ORAL_TABLET | Freq: Once | ORAL | Status: AC
  Administered 2017-09-29: 5 mg via ORAL
  Filled 2017-09-29: qty 1

## 2017-09-29 MED ORDER — SODIUM CHLORIDE 0.9 % IV SOLN
INTRAVENOUS | Status: DC
  Administered 2017-09-29 – 2017-10-03 (×8): via INTRAVENOUS

## 2017-09-29 MED ORDER — VANCOMYCIN HCL 10 G IV SOLR
1250.0000 mg | INTRAVENOUS | Status: DC
  Administered 2017-09-29 – 2017-10-02 (×4): 1250 mg via INTRAVENOUS
  Filled 2017-09-29 (×5): qty 1250

## 2017-09-29 MED ORDER — INSULIN ASPART PROT & ASPART (70-30 MIX) 100 UNIT/ML ~~LOC~~ SUSP
35.0000 [IU] | Freq: Two times a day (BID) | SUBCUTANEOUS | Status: DC
Start: 2017-09-30 — End: 2017-10-02
  Administered 2017-10-02: 35 [IU] via SUBCUTANEOUS

## 2017-09-29 MED ORDER — LORATADINE 10 MG PO TABS
10.0000 mg | ORAL_TABLET | Freq: Every day | ORAL | Status: DC
  Administered 2017-09-29 – 2017-10-05 (×7): 10 mg via ORAL
  Filled 2017-09-29 (×7): qty 1

## 2017-09-29 MED ORDER — SALINE SPRAY 0.65 % NA SOLN
2.0000 | NASAL | Status: DC | PRN
  Administered 2017-09-29: 2 via NASAL
  Filled 2017-09-29: qty 44

## 2017-09-29 NOTE — Progress Notes (Signed)
PROGRESS NOTE    [name]  ZOX:[id number] DOB: [birth year] DOA: 09/27/2017 PCP: [name], MD     Brief Narrative:  76 year old man admitted from home on 11/12 due to increasing pain and edema around his left foot.  He states he had a rock inside his shoe which she did not realize until it had already penetrated his foot and then he subsequently noticed redness surrounding the dorsum of the foot.  On 11/13 he lost IV access, PICC was ordered and was transitioned over to oral doxycycline.  However over the past 24 hours his cellulitis has significantly worsened and will transition back over to IV antibiotics on 11/14.   Assessment & Plan:   Active Problems:   Hypertensive cardiovascular disease   Biventricular ICD (implantable cardioverter-defibrillator) in place   Cardiomyopathy, ischemic   Paroxysmal atrial fibrillation (HCC)   CKD (chronic kidney disease) stage 3, GFR 30-59 ml/min (HCC)   PVD (peripheral vascular disease) with claudication (HCC)   Diabetic foot infection (HCC)   Left foot cellulitis -See pictures below for details. -CT scan without evidence for osteomyelitis or abscess formation. -We will DC doxy and placed back on IV vancomycin and Zosyn today.  Acute on chronic kidney disease stage III -Continue to hold nephrotoxins, baseline creatinine is around 1.5-1.7, is 1.9 today. -Resume IV fluids.  Insulin-dependent diabetes -Uncontrolled, will increase 7030 from 30-35 units twice daily.  Paroxysmal atrial fibrillation -Rate controlled, continue warfarin and Coreg.  Rheumatoid arthritis -Stable, sulfasalazine has been resumed.  Gout -Stable, continue allopurinol.  Essential hypertension -Well-controlled, continue home medications   DVT prophylaxis: Warfarin Code Status: Full code Family Communication: Patient only Disposition Plan: To be determined pending improvement of cellulitis and renal function  Consultants:   None  Procedures:    None  Antimicrobials:  Anti-infectives (From admission, onward)   Start     Dose/Rate Route Frequency Ordered Stop   09/29/17 2000  vancomycin (VANCOCIN) 1,500 mg in sodium chloride 0.9 % 500 mL IVPB  Status:  Discontinued     1,500 mg 250 mL/hr over 120 Minutes Intravenous Every 48 hours 09/27/17 1808 09/28/17 1100   09/28/17 2200  doxycycline (VIBRA-TABS) tablet 100 mg  Status:  Discontinued     100 mg Oral Every 12 hours 09/28/17 1904 09/29/17 1751   09/28/17 2000  vancomycin (VANCOCIN) 1,250 mg in sodium chloride 0.9 % 250 mL IVPB  Status:  Discontinued     1,250 mg 166.7 mL/hr over 90 Minutes Intravenous Every 24 hours 09/28/17 1100 09/28/17 1904   09/27/17 2100  ceFEPIme (MAXIPIME) 2 g in dextrose 5 % 50 mL IVPB  Status:  Discontinued     2 g 100 mL/hr over 30 Minutes Intravenous Every 24 hours 09/27/17 1808 09/28/17 1904   09/27/17 2000  vancomycin (VANCOCIN) IVPB 1000 mg/200 mL premix     1,000 mg 200 mL/hr over 60 Minutes Intravenous  Once 09/27/17 1808 09/27/17 2100   09/27/17 1245  vancomycin (VANCOCIN) IVPB 1000 mg/200 mL premix     1,000 mg 200 mL/hr over 60 Minutes Intravenous  Once 09/27/17 1238 09/27/17 1440   09/27/17 1245  piperacillin-tazobactam (ZOSYN) IVPB 3.375 g     3.375 g 100 mL/hr over 30 Minutes Intravenous  Once 09/27/17 1238 09/27/17 1405       Subjective: Has no complaints, feels well.  Objective: Vitals:   09/28/17 2032 09/29/17 0500 09/29/17 0804 09/29/17 0924  BP: (!) 84/68 (!) 117/49  (!) 164/60  Pulse: 81 82  76  Resp: 17 18    Temp: 99.6 F (37.6 C) 98.7 F (37.1 C)    TempSrc: Oral Oral    SpO2: 94% 92% 92%   Weight:      Height:        Intake/Output Summary (Last 24 hours) at 09/29/2017 1751 Last data filed at 09/29/2017 1749 Gross per 24 hour  Intake -  Output 1100 ml  Net -1100 ml   Filed Weights   09/27/17 1057 09/27/17 1807  Weight: 113.4 kg (250 lb) 112.9 kg (248 lb 14.4 oz)    Examination:  General exam:  Alert, awake, oriented x 3, obese Respiratory system: Clear to auscultation. Respiratory effort normal. Cardiovascular system:RRR. No murmurs, rubs, gallops. Gastrointestinal system: Abdomen is nondistended, soft and nontender. No organomegaly or masses felt. Normal bowel sounds heard. Central nervous system: Alert and oriented. No focal neurological deficits. Extremities: See below pictures:       Psychiatry: Judgement and insight appear normal. Mood & affect appropriate.     Data Reviewed: I have personally reviewed following labs and imaging studies  CBC: Recent Labs  Lab 09/24/17 0838 09/27/17 1147 09/28/17 0506 09/29/17 0440  WBC 11.9* 11.1* 9.1 11.6*  NEUTROABS 9.7* 9.0*  --   --   HGB 13.7 12.3* 13.2 13.3  HCT 41.5 38.0* 40.1 40.0  MCV 96.7 98.4 99.3 98.0  PLT 161 209 225 265   Basic Metabolic Panel: Recent Labs  Lab 09/24/17 0838 09/27/17 1147 09/28/17 0506 09/29/17 0440  NA 132* 130* 132* 129*  K 4.7 5.0 4.8 5.1  CL 99* 99* 98* 96*  CO2 24 21* 25 21*  GLUCOSE 175* 213* 156* 249*  BUN 45* 50* 45* 44*  CREATININE 1.60* 2.10* 1.86* 1.97*  CALCIUM 9.3 9.0 9.0 9.1  MG  --   --   --  2.1   GFR: Estimated Creatinine Clearance: 38.3 mL/min (A) (by C-G formula based on SCr of 1.97 mg/dL (H)). Liver Function Tests: No results for input(s): AST, ALT, ALKPHOS, BILITOT, PROT, ALBUMIN in the last 168 hours. No results for input(s): LIPASE, AMYLASE in the last 168 hours. No results for input(s): AMMONIA in the last 168 hours. Coagulation Profile: Recent Labs  Lab 09/27/17 1309 09/28/17 0506 09/29/17 0440  INR 1.85 1.59 1.51   Cardiac Enzymes: No results for input(s): CKTOTAL, CKMB, CKMBINDEX, TROPONINI in the last 168 hours. BNP (last 3 results) No results for input(s): PROBNP in the last 8760 hours. HbA1C: Recent Labs    09/28/17 0507  HGBA1C 7.8*   CBG: Recent Labs  Lab 09/28/17 1635 09/28/17 2041 09/29/17 0742 09/29/17 1147 09/29/17 1702   GLUCAP 151* 218* 222* 309* 173*   Lipid Profile: No results for input(s): CHOL, HDL, LDLCALC, TRIG, CHOLHDL, LDLDIRECT in the last 72 hours. Thyroid Function Tests: No results for input(s): TSH, T4TOTAL, FREET4, T3FREE, THYROIDAB in the last 72 hours. Anemia Panel: No results for input(s): VITAMINB12, FOLATE, FERRITIN, TIBC, IRON, RETICCTPCT in the last 72 hours. Urine analysis:    Component Value Date/Time   COLORURINE YELLOW 07/05/2014 2117   APPEARANCEUR CLEAR 07/05/2014 2117   LABSPEC 1.017 07/05/2014 2117   PHURINE 5.0 07/05/2014 2117   GLUCOSEU NEGATIVE 07/05/2014 2117   HGBUR NEGATIVE 07/05/2014 2117   BILIRUBINUR NEGATIVE 07/05/2014 2117   KETONESUR NEGATIVE 07/05/2014 2117   PROTEINUR 30 (A) 07/05/2014 2117   UROBILINOGEN 0.2 07/05/2014 2117   NITRITE NEGATIVE 07/05/2014 2117   LEUKOCYTESUR SMALL (A) 07/05/2014 2117   Sepsis Labs: @LABRCNTIP (procalcitonin:4,lacticidven:4)  )  Recent Results (from the past 240 hour(s))  Culture, blood (Routine X 2) w Reflex to ID Panel     Status: None (Preliminary result)   Collection Time: 09/27/17  1:09 PM  Result Value Ref Range Status   Specimen Description BLOOD RIGHT ARM  Final   Special Requests   Final    BOTTLES DRAWN AEROBIC AND ANAEROBIC Blood Culture adequate volume   Culture NO GROWTH 2 DAYS  Final   Report Status PENDING  Incomplete  Culture, blood (Routine X 2) w Reflex to ID Panel     Status: None (Preliminary result)   Collection Time: 09/27/17  1:23 PM  Result Value Ref Range Status   Specimen Description BLOOD RIGHT HAND  Final   Special Requests   Final    BOTTLES DRAWN AEROBIC AND ANAEROBIC Blood Culture adequate volume   Culture NO GROWTH 2 DAYS  Final   Report Status PENDING  Incomplete         Radiology Studies: Ct Foot Left W Contrast  Result Date: 09/28/2017 CLINICAL DATA:  Pain on the bottom of the left foot after stepping on a nail 2-1/2 weeks ago. History of diabetes. Evaluate for  osteomyelitis. EXAM: CT OF THE LOWER LEFT EXTREMITY WITH CONTRAST TECHNIQUE: Multidetector CT imaging of the lower left extremity was performed according to the standard protocol following intravenous contrast administration. COMPARISON:  Left foot x-rays dated September 24, 2017. CONTRAST:  51mL ISOVUE-300 IOPAMIDOL (ISOVUE-300) INJECTION 61% FINDINGS: Bones/Joint/Cartilage No acute fracture or malalignment. No cortical destruction or bony erosion. There is mild joint space narrowing of the first MTP joint. Small first MTP joint effusion. There is joint space narrowing and subchondral cystic change involving the second and third tarsometatarsal joints, and naviculocuneiform joint. Cystic changes within the anterior calcaneus are unchanged. Plantar and Achilles enthesopathy again noted. Ligaments Suboptimally assessed by CT. Muscles and Tendons Diffuse fatty atrophy of the intrinsic muscles of the forefoot. The visualized flexor and extensor tendons are grossly intact. There is dystrophic calcification in the region of the distal posterior tibialis tendon, nonspecific. Soft tissues There is mild diffuse soft tissue swelling about the forefoot, more focal along the plantar aspect of the first through third MTP joints. No discrete drainable fluid collection. No radiopaque foreign body. Atherosclerotic vascular calcifications. IMPRESSION: 1. No CT evidence of osteomyelitis. 2. Plantar forefoot soft tissue swelling at the base of the first through third MCP joints, which can be seen with cellulitis. No discrete drainable fluid collection. 3. Nonspecific dystrophic calcifications in the region of the distal posterior tibialis tendon, which could be related to prior trauma or crystal arthropathy such as gout. Electronically Signed   By: Obie Dredge M.D.   On: 09/28/2017 12:26        Scheduled Meds: . allopurinol  100 mg Oral Daily  . carvedilol  12.5 mg Oral BID WC  . clopidogrel  75 mg Oral Daily  . heparin   5,000 Units Subcutaneous Q8H  . hydrALAZINE  25 mg Oral TID  . insulin aspart protamine- aspart  30 Units Subcutaneous BID WC  . isosorbide mononitrate  30 mg Oral Daily  . levothyroxine  50 mcg Oral QAC breakfast  . sulfaSALAzine  500 mg Oral BID  . tamsulosin  0.4 mg Oral QPC supper  . Warfarin - Pharmacist Dosing Inpatient   Does not apply Q24H   Continuous Infusions:   LOS: 2 days    Time spent: 35 minutes. Greater than 50% of this time was spent  in direct contact with the patient coordinating care.     Chaya Jan, MD [facility] Pager [phone]  If 7PM-7AM, please contact night-coverage [email or web address] Password [email or web address] 09/29/2017, 5:51 PM

## 2017-09-29 NOTE — Progress Notes (Signed)
Pharmacy Antibiotic Note  [name] is a 76 y.o. male admitted on 09/27/2017 with wound infection. He lost IV access and was switched to po doxycycline but cellulitis and worsened so will resume IV antibiotics vancomycin and zosyn.  Plan:  Vancomycin to 1250mg  IV every 24 hours.  Goal trough 10-15 mcg/mL.  Zosyn 3.375 gm IV q8 hours Monitor labs, micro and vitals.  Height: 5\' 8"  (172.7 cm) Weight: 248 lb 14.4 oz (112.9 kg) IBW/kg (Calculated) : 68.4  Temp (24hrs), Avg:99.2 F (37.3 C), Min:98.7 F (37.1 C), Max:99.6 F (37.6 C)  Recent Labs  Lab 09/24/17 0838 09/27/17 1147 09/28/17 0506 09/29/17 0440  WBC 11.9* 11.1* 9.1 11.6*  CREATININE 1.60* 2.10* 1.86* 1.97*    Estimated Creatinine Clearance: 38.3 mL/min (A) (by C-G formula based on SCr of 1.97 mg/dL (H)).    Allergies  Allergen Reactions  . Allegra [Fexofenadine]   . Biaxin [Clarithromycin]   . Crestor [Rosuvastatin Calcium]   . Glucophage [Metformin Hcl]   . Trimethoprim   . Codeine Other (See Comments)    blisters, but able to take hydrocodone   Antimicrobials this admission: Vanc 11/12 >> 11/12, 11/14>> Cefepime 11/12 >> 11/12 Zosyn x 1 11/12, 11/14>>  Microbiology results: 11/12 BCx: ngtd  UCx:    Sputum:    MRSA PCR:   Thank you for allowing pharmacy to be a part of this patient's care.  13/12 [place] 09/29/2017 7:59 PM

## 2017-09-29 NOTE — Progress Notes (Signed)
ANTICOAGULATION CONSULT NOTE  Pharmacy Consult for Warfarin Indication: atrial fibrillation  Allergies  Allergen Reactions  . Allegra [Fexofenadine]   . Biaxin [Clarithromycin]   . Crestor [Rosuvastatin Calcium]   . Glucophage [Metformin Hcl]   . Trimethoprim   . Codeine Other (See Comments)    blisters, but able to take hydrocodone   Patient Measurements: Height: 5\' 8"  (172.7 cm) Weight: 248 lb 14.4 oz (112.9 kg) IBW/kg (Calculated) : 68.4  Vital Signs: Temp: 98.7 F (37.1 C) (11/14 0500) Temp Source: Oral (11/14 0500) BP: 117/49 (11/14 0500) Pulse Rate: 82 (11/14 0500)  Labs: Recent Labs    09/27/17 1147 09/27/17 1309 09/28/17 0506 09/29/17 0440  HGB 12.3*  --  13.2 13.3  HCT 38.0*  --  40.1 40.0  PLT 209  --  225 265  LABPROT  --  21.2* 18.8* 18.1*  INR  --  1.85 1.59 1.51  CREATININE 2.10*  --  1.86* 1.97*   Estimated Creatinine Clearance: 38.3 mL/min (A) (by C-G formula based on SCr of 1.97 mg/dL (H)).  Assessment: Okay for Protocol, INR remains below goal.  No bleeding noted.  Also on SQ Heparin.  Goal of Therapy:  INR 2-3   Plan:  Warfarin 5mg  PO x 1. Daily PT/INR. Monitor for signs and symptoms of bleeding.   10/01/17 09/29/2017,9:09 AM

## 2017-09-29 NOTE — Progress Notes (Signed)
Inpatient Diabetes Program Recommendations  AACE/ADA: New Consensus Statement on Inpatient Glycemic Control (2015)  Target Ranges:  Prepandial:   less than 140 mg/dL      Peak postprandial:   less than 180 mg/dL (1-2 hours)      Critically ill patients:  140 - 180 mg/dL   Results for KENTRAIL, SHEW (MRN [id number]) as of 09/29/2017 15:00  Ref. Range 09/29/2017 07:42 09/29/2017 11:47  Glucose-Capillary Latest Ref Range: 65 - 99 mg/dL 751 (H) 700 (H)    Admit with: Cellulitis L Foot  History: DM, CKD  Home DM Meds: Humalog 75/25 Insulin- 35 units BID       Occasionally uses 20-25 units at lunch as well  Current Insulin Orders: 70/30 Insulin- 30 units BID      MD- Note 70/30 Insulin started this AM.  Please place orders for Novolog Moderate Correction Scale/ SSI (0-15 units) TID AC + HS      --Will follow patient during hospitalization--  Ambrose Finland RN, MSN, CDE Diabetes Coordinator Inpatient Glycemic Control Team Team Pager: [phone] (8a-5p)

## 2017-09-30 ENCOUNTER — Telehealth: Payer: Self-pay | Admitting: Podiatry

## 2017-09-30 DIAGNOSIS — N179 Acute kidney failure, unspecified: Secondary | ICD-10-CM

## 2017-09-30 LAB — CBC
HCT: 36 % — ABNORMAL LOW (ref 39.0–52.0)
Hemoglobin: 11.8 g/dL — ABNORMAL LOW (ref 13.0–17.0)
MCH: 32.4 pg (ref 26.0–34.0)
MCHC: 32.8 g/dL (ref 30.0–36.0)
MCV: 98.9 fL (ref 78.0–100.0)
PLATELETS: 240 10*3/uL (ref 150–400)
RBC: 3.64 MIL/uL — ABNORMAL LOW (ref 4.22–5.81)
RDW: 14.5 % (ref 11.5–15.5)
WBC: 11.1 10*3/uL — AB (ref 4.0–10.5)

## 2017-09-30 LAB — GLUCOSE, CAPILLARY
GLUCOSE-CAPILLARY: 134 mg/dL — AB (ref 65–99)
GLUCOSE-CAPILLARY: 176 mg/dL — AB (ref 65–99)
GLUCOSE-CAPILLARY: 203 mg/dL — AB (ref 65–99)
Glucose-Capillary: 100 mg/dL — ABNORMAL HIGH (ref 65–99)

## 2017-09-30 LAB — BASIC METABOLIC PANEL
Anion gap: 11 (ref 5–15)
BUN: 49 mg/dL — AB (ref 6–20)
CO2: 23 mmol/L (ref 22–32)
CREATININE: 2.14 mg/dL — AB (ref 0.61–1.24)
Calcium: 8.9 mg/dL (ref 8.9–10.3)
Chloride: 98 mmol/L — ABNORMAL LOW (ref 101–111)
GFR calc Af Amer: 33 mL/min — ABNORMAL LOW (ref >60)
GFR, EST NON AFRICAN AMERICAN: 28 mL/min — AB (ref >60)
Glucose, Bld: 85 mg/dL (ref 65–99)
Potassium: 5.2 mmol/L — ABNORMAL HIGH (ref 3.5–5.1)
SODIUM: 132 mmol/L — AB (ref 135–145)

## 2017-09-30 LAB — PROTIME-INR
INR: 1.83
Prothrombin Time: 21 seconds — ABNORMAL HIGH (ref 11.4–15.2)

## 2017-09-30 MED ORDER — WARFARIN SODIUM 5 MG PO TABS
5.0000 mg | ORAL_TABLET | Freq: Once | ORAL | Status: AC
  Administered 2017-09-30: 5 mg via ORAL
  Filled 2017-09-30: qty 1

## 2017-09-30 NOTE — Progress Notes (Signed)
PROGRESS NOTE    [name]  ZOX:[id number] DOB: 1941/06/15 DOA: 09/27/2017 PCP: [name], MD     Brief Narrative:  76 year old man admitted from home on 11/12 due to increasing pain and edema around his left foot.  He states he had a rock inside his shoe which she did not realize until it had already penetrated his foot and then he subsequently noticed redness surrounding the dorsum of the foot.  On 11/13 he lost IV access, PICC was ordered and was transitioned over to oral doxycycline.  On 11/14 he was placed back on IV antibiotics due to concern of worsening cellulitis.  As of 11/15 looks the same to slightly improved.  Assessment & Plan:   Active Problems:   Hypertensive cardiovascular disease   Biventricular ICD (implantable cardioverter-defibrillator) in place   Cardiomyopathy, ischemic   Paroxysmal atrial fibrillation (HCC)   CKD (chronic kidney disease) stage 3, GFR 30-59 ml/min (HCC)   PVD (peripheral vascular disease) with claudication (HCC)   Diabetic foot infection (HCC)   Left foot cellulitis -See pictures below for details. -CT scan without evidence for osteomyelitis or abscess formation.  No indication for surgical consultation. -Good pedal pulses. -We will continue broad-spectrum antibiotic therapy consisting of vancomycin and Zosyn for now.  Acute on chronic kidney disease stage III -Continue to hold nephrotoxins, baseline creatinine is around 1.5-1.7, is 2.14 today up from 1.9 on 11/14. -We will increase rate of IV fluids, if no improvement overnight will consider nephrology consultation.  Insulin-dependent diabetes -70/30 increased from 30-35 units twice daily on 11/14.  CBGs with improved control, continue to follow.  Paroxysmal atrial fibrillation -Rate controlled, continue warfarin and Coreg.  Rheumatoid arthritis -Stable, sulfasalazine has been resumed.  Gout -Stable, continue allopurinol.  Essential hypertension -Well-controlled, continue  home medications   DVT prophylaxis: Warfarin Code Status: Full code Family Communication: Discussed with wife via phone.   disposition Plan: To be determined pending improvement of cellulitis and renal function  Consultants:   None  Procedures:   None  Antimicrobials:  Anti-infectives (From admission, onward)   Start     Dose/Rate Route Frequency Ordered Stop   09/29/17 2000  vancomycin (VANCOCIN) 1,500 mg in sodium chloride 0.9 % 500 mL IVPB  Status:  Discontinued     1,500 mg 250 mL/hr over 120 Minutes Intravenous Every 48 hours 09/27/17 1808 09/28/17 1100   09/29/17 2000  vancomycin (VANCOCIN) 1,250 mg in sodium chloride 0.9 % 250 mL IVPB     1,250 mg 166.7 mL/hr over 90 Minutes Intravenous Every 24 hours 09/29/17 1959     09/29/17 2000  piperacillin-tazobactam (ZOSYN) IVPB 3.375 g     3.375 g 12.5 mL/hr over 240 Minutes Intravenous Every 8 hours 09/29/17 1959     09/28/17 2200  doxycycline (VIBRA-TABS) tablet 100 mg  Status:  Discontinued     100 mg Oral Every 12 hours 09/28/17 1904 09/29/17 1751   09/28/17 2000  vancomycin (VANCOCIN) 1,250 mg in sodium chloride 0.9 % 250 mL IVPB  Status:  Discontinued     1,250 mg 166.7 mL/hr over 90 Minutes Intravenous Every 24 hours 09/28/17 1100 09/28/17 1904   09/27/17 2100  ceFEPIme (MAXIPIME) 2 g in dextrose 5 % 50 mL IVPB  Status:  Discontinued     2 g 100 mL/hr over 30 Minutes Intravenous Every 24 hours 09/27/17 1808 09/28/17 1904   09/27/17 2000  vancomycin (VANCOCIN) IVPB 1000 mg/200 mL premix     1,000 mg 200 mL/hr  over 60 Minutes Intravenous  Once 09/27/17 1808 09/27/17 2100   09/27/17 1245  vancomycin (VANCOCIN) IVPB 1000 mg/200 mL premix     1,000 mg 200 mL/hr over 60 Minutes Intravenous  Once 09/27/17 1238 09/27/17 1440   09/27/17 1245  piperacillin-tazobactam (ZOSYN) IVPB 3.375 g     3.375 g 100 mL/hr over 30 Minutes Intravenous  Once 09/27/17 1238 09/27/17 1405       Subjective: Has no complaints, feels  well.  Objective: Vitals:   09/30/17 0704 09/30/17 0845 09/30/17 0906 09/30/17 1443  BP: (!) 153/63  (!) 152/65 (!) 132/57  Pulse: 89  81 78  Resp: 17   16  Temp: 98.7 F (37.1 C)   98.6 F (37 C)  TempSrc: Oral     SpO2: 95% 93%  93%  Weight:      Height:        Intake/Output Summary (Last 24 hours) at 09/30/2017 1528 Last data filed at 09/30/2017 1206 Gross per 24 hour  Intake 1727.5 ml  Output 400 ml  Net 1327.5 ml   Filed Weights   09/27/17 1057 09/27/17 1807  Weight: 113.4 kg (250 lb) 112.9 kg (248 lb 14.4 oz)    Examination:  General exam: Alert, awake, oriented x 3, obese Respiratory system: Clear to auscultation. Respiratory effort normal. Cardiovascular system:RRR. No murmurs, rubs, gallops. Gastrointestinal system: Abdomen is nondistended, soft and nontender. No organomegaly or masses felt. Normal bowel sounds heard. Central nervous system: Alert and oriented. No focal neurological deficits. Extremities: See below pictures:           Psychiatry: Judgement and insight appear normal. Mood & affect appropriate.     Data Reviewed: I have personally reviewed following labs and imaging studies  CBC: Recent Labs  Lab 09/24/17 0838 09/27/17 1147 09/28/17 0506 09/29/17 0440 09/30/17 0501  WBC 11.9* 11.1* 9.1 11.6* 11.1*  NEUTROABS 9.7* 9.0*  --   --   --   HGB 13.7 12.3* 13.2 13.3 11.8*  HCT 41.5 38.0* 40.1 40.0 36.0*  MCV 96.7 98.4 99.3 98.0 98.9  PLT 161 209 225 265 240   Basic Metabolic Panel: Recent Labs  Lab 09/24/17 0838 09/27/17 1147 09/28/17 0506 09/29/17 0440 09/30/17 0501  NA 132* 130* 132* 129* 132*  K 4.7 5.0 4.8 5.1 5.2*  CL 99* 99* 98* 96* 98*  CO2 24 21* 25 21* 23  GLUCOSE 175* 213* 156* 249* 85  BUN 45* 50* 45* 44* 49*  CREATININE 1.60* 2.10* 1.86* 1.97* 2.14*  CALCIUM 9.3 9.0 9.0 9.1 8.9  MG  --   --   --  2.1  --    GFR: Estimated Creatinine Clearance: 35.2 mL/min (A) (by C-G formula based on SCr of 2.14 mg/dL  (H)). Liver Function Tests: No results for input(s): AST, ALT, ALKPHOS, BILITOT, PROT, ALBUMIN in the last 168 hours. No results for input(s): LIPASE, AMYLASE in the last 168 hours. No results for input(s): AMMONIA in the last 168 hours. Coagulation Profile: Recent Labs  Lab 09/27/17 1309 09/28/17 0506 09/29/17 0440 09/30/17 0501  INR 1.85 1.59 1.51 1.83   Cardiac Enzymes: No results for input(s): CKTOTAL, CKMB, CKMBINDEX, TROPONINI in the last 168 hours. BNP (last 3 results) No results for input(s): PROBNP in the last 8760 hours. HbA1C: Recent Labs    09/28/17 0507  HGBA1C 7.8*   CBG: Recent Labs  Lab 09/29/17 1147 09/29/17 1702 09/29/17 2202 09/30/17 0803 09/30/17 1158  GLUCAP 309* 173* 78 100* 134*  Lipid Profile: No results for input(s): CHOL, HDL, LDLCALC, TRIG, CHOLHDL, LDLDIRECT in the last 72 hours. Thyroid Function Tests: No results for input(s): TSH, T4TOTAL, FREET4, T3FREE, THYROIDAB in the last 72 hours. Anemia Panel: No results for input(s): VITAMINB12, FOLATE, FERRITIN, TIBC, IRON, RETICCTPCT in the last 72 hours. Urine analysis:    Component Value Date/Time   COLORURINE YELLOW 07/05/2014 2117   APPEARANCEUR CLEAR 07/05/2014 2117   LABSPEC 1.017 07/05/2014 2117   PHURINE 5.0 07/05/2014 2117   GLUCOSEU NEGATIVE 07/05/2014 2117   HGBUR NEGATIVE 07/05/2014 2117   BILIRUBINUR NEGATIVE 07/05/2014 2117   KETONESUR NEGATIVE 07/05/2014 2117   PROTEINUR 30 (A) 07/05/2014 2117   UROBILINOGEN 0.2 07/05/2014 2117   NITRITE NEGATIVE 07/05/2014 2117   LEUKOCYTESUR SMALL (A) 07/05/2014 2117   Sepsis Labs: @LABRCNTIP (procalcitonin:4,lacticidven:4)  ) Recent Results (from the past 240 hour(s))  Culture, blood (Routine X 2) w Reflex to ID Panel     Status: None (Preliminary result)   Collection Time: 09/27/17  1:09 PM  Result Value Ref Range Status   Specimen Description BLOOD RIGHT ARM  Final   Special Requests   Final    BOTTLES DRAWN AEROBIC AND  ANAEROBIC Blood Culture adequate volume   Culture NO GROWTH 3 DAYS  Final   Report Status PENDING  Incomplete  Culture, blood (Routine X 2) w Reflex to ID Panel     Status: None (Preliminary result)   Collection Time: 09/27/17  1:23 PM  Result Value Ref Range Status   Specimen Description BLOOD RIGHT HAND  Final   Special Requests   Final    BOTTLES DRAWN AEROBIC AND ANAEROBIC Blood Culture adequate volume   Culture NO GROWTH 3 DAYS  Final   Report Status PENDING  Incomplete         Radiology Studies: No results found.      Scheduled Meds: . allopurinol  100 mg Oral Daily  . carvedilol  12.5 mg Oral BID WC  . clopidogrel  75 mg Oral Daily  . heparin  5,000 Units Subcutaneous Q8H  . hydrALAZINE  25 mg Oral TID  . insulin aspart protamine- aspart  35 Units Subcutaneous BID WC  . isosorbide mononitrate  30 mg Oral Daily  . levothyroxine  50 mcg Oral QAC breakfast  . loratadine  10 mg Oral Daily  . sulfaSALAzine  500 mg Oral BID  . tamsulosin  0.4 mg Oral QPC supper  . Warfarin - Pharmacist Dosing Inpatient   Does not apply Q24H   Continuous Infusions: . sodium chloride 75 mL/hr at 09/30/17 0904  . piperacillin-tazobactam (ZOSYN)  IV 3.375 g (09/30/17 1206)  . vancomycin Stopped (09/29/17 2230)     LOS: 3 days    Time spent: 25 minutes. Greater than 50% of this time was spent in direct contact with the patient coordinating care.     10/01/17, MD [facility] Pager [phone]  If 7PM-7AM, please contact night-coverage [email or web address] Password [email or web address] 09/30/2017, 3:28 PM

## 2017-09-30 NOTE — Telephone Encounter (Signed)
I called pt's wife, Bonita Quin on her work phone and she explained that pt had been at the hospital and been put on IV antibiotics and when she went back, the foot looked worse, and there were orders for pt to be on oral antibiotics but none had been given. Bonita Quin stated she wanted pt to be seen at [facility] and the hospital where he is would not release him unless they had a doctor's name. I told Bonita Quin, our doctors could not release pt from where he is, but would see him at [facility] once the hospital doctor had made a referral to our office. Bonita Quin stated she was going to get pt to the [facility] hospital and get the referral.

## 2017-09-30 NOTE — Progress Notes (Signed)
ANTICOAGULATION CONSULT NOTE  Pharmacy Consult for Warfarin Indication: atrial fibrillation  Allergies  Allergen Reactions  . Allegra [Fexofenadine]   . Biaxin [Clarithromycin]   . Crestor [Rosuvastatin Calcium]   . Glucophage [Metformin Hcl]   . Trimethoprim   . Codeine Other (See Comments)    blisters, but able to take hydrocodone   Patient Measurements: Height: 5\' 8"  (172.7 cm) Weight: 248 lb 14.4 oz (112.9 kg) IBW/kg (Calculated) : 68.4  Vital Signs: Temp: 98.7 F (37.1 C) (11/15 0704) Temp Source: Oral (11/15 0704) BP: 152/65 (11/15 0906) Pulse Rate: 81 (11/15 0906)  Labs: Recent Labs    09/28/17 0506 09/29/17 0440 09/30/17 0501  HGB 13.2 13.3 11.8*  HCT 40.1 40.0 36.0*  PLT 225 265 240  LABPROT 18.8* 18.1* 21.0*  INR 1.59 1.51 1.83  CREATININE 1.86* 1.97* 2.14*   Estimated Creatinine Clearance: 35.2 mL/min (A) (by C-G formula based on SCr of 2.14 mg/dL (H)).  Assessment: Okay for Protocol, INR remains below goal, but trending up.  No bleeding noted. Also on SQ heparin (d/c when INR >/= 2).  Goal of Therapy:  INR 2-3   Plan:  Repeat Warfarin 5mg  PO x 1. Daily PT/INR. Monitor for signs and symptoms of bleeding.   10/02/17 R 09/30/2017,10:55 AM

## 2017-09-30 NOTE — Progress Notes (Signed)
Patient wife concerned patient will "loose his foot", MD consulted to speak with wife with her concerns. MD called, wife had stepped out of the hospital at that time, awaiting wife to return to have MD speak with her about her concerns.

## 2017-09-30 NOTE — Progress Notes (Signed)
Inpatient Diabetes Program Recommendations  AACE/ADA: New Consensus Statement on Inpatient Glycemic Control (2015)  Target Ranges:  Prepandial:   less than 140 mg/dL      Peak postprandial:   less than 180 mg/dL (1-2 hours)      Critically ill patients:  140 - 180 mg/dL  Results for WENDEL, HOMEYER (MRN [id number]) as of 09/30/2017 07:46  Ref. Range 09/30/2017 05:01  Glucose Latest Ref Range: 65 - 99 mg/dL 85   Results for KELVYN, SCHUNK (MRN [id number]) as of 09/30/2017 07:46  Ref. Range 09/29/2017 07:42 09/29/2017 11:47 09/29/2017 17:02 09/29/2017 22:02  Glucose-Capillary Latest Ref Range: 65 - 99 mg/dL 353 (H) 299 (H) 242 (H) 78   Review of Glycemic Control  Diabetes history: DM2 Outpatient Diabetes medications: Humalog 75/25 35 units BID (occasionally uses 20-25 units with lunch) Current orders for Inpatient glycemic control: 70/30 35 units BID   Inpatient Diabetes Program Recommendations:  Insulin - Basal: Patient received 70/30 30 units twice on 09/29/17 and fasting glucose 83 mg/dl this morning. Noted 70/30 has been increased to 35 units BID. Please consider adjusting 70/30 to 35 units QAM and decrease evening dose to 70/30 28 units QPM with supper. Correction (SSI): While inpatient, please consider ordering Novolog correction scale ACHS.  Thanks, Orlando Penner, RN, MSN, CDE Diabetes Coordinator Inpatient Diabetes Program [phone] (Team Pager from 8am to 5pm)

## 2017-09-30 NOTE — Telephone Encounter (Signed)
This is [name] calling for [name]. He is currently in the Hospital at [facility] for a foot infection. We [facility] to transfer [facility] to [facility] and we need the doctor to accept him. The number is [phone]. Thank you.

## 2017-09-30 NOTE — Telephone Encounter (Signed)
This is [name] calling for [name]. We're trying to get him moved from [facility] to [place] and we need a doctor there to accept him for his foot. I'm afraid that he is going to lose his foot if something is not done. Please call me at [phone]. Thank you.

## 2017-09-30 NOTE — Telephone Encounter (Signed)
Unable to inform pt's wife, Bonita Quin, that once pt is admitted to [facility] and evaluated, ask the Hospitalist to make a referral to our office through Epic, mail box is not set up.

## 2017-10-01 LAB — GLUCOSE, CAPILLARY
GLUCOSE-CAPILLARY: 227 mg/dL — AB (ref 65–99)
Glucose-Capillary: 190 mg/dL — ABNORMAL HIGH (ref 65–99)
Glucose-Capillary: 246 mg/dL — ABNORMAL HIGH (ref 65–99)
Glucose-Capillary: 245 mg/dL — ABNORMAL HIGH (ref 65–99)

## 2017-10-01 LAB — PROTIME-INR
INR: 2.55
Prothrombin Time: 27.2 seconds — ABNORMAL HIGH (ref 11.4–15.2)

## 2017-10-01 LAB — BASIC METABOLIC PANEL
Anion gap: 7 (ref 5–15)
BUN: 46 mg/dL — AB (ref 6–20)
CHLORIDE: 101 mmol/L (ref 101–111)
CO2: 22 mmol/L (ref 22–32)
Calcium: 8.4 mg/dL — ABNORMAL LOW (ref 8.9–10.3)
Creatinine, Ser: 2.03 mg/dL — ABNORMAL HIGH (ref 0.61–1.24)
GFR calc Af Amer: 35 mL/min — ABNORMAL LOW (ref >60)
GFR calc non Af Amer: 30 mL/min — ABNORMAL LOW (ref >60)
GLUCOSE: 204 mg/dL — AB (ref 65–99)
POTASSIUM: 5 mmol/L (ref 3.5–5.1)
Sodium: 130 mmol/L — ABNORMAL LOW (ref 135–145)

## 2017-10-01 MED ORDER — WARFARIN SODIUM 2.5 MG PO TABS
2.5000 mg | ORAL_TABLET | Freq: Once | ORAL | Status: AC
  Administered 2017-10-01: 2.5 mg via ORAL
  Filled 2017-10-01: qty 1

## 2017-10-01 NOTE — Progress Notes (Signed)
ANTICOAGULATION CONSULT NOTE  Pharmacy Consult for Warfarin Indication: atrial fibrillation  Allergies  Allergen Reactions  . Allegra [Fexofenadine]   . Biaxin [Clarithromycin]   . Crestor [Rosuvastatin Calcium]   . Glucophage [Metformin Hcl]   . Trimethoprim   . Codeine Other (See Comments)    blisters, but able to take hydrocodone   Patient Measurements: Height: 5\' 8"  (172.7 cm) Weight: 248 lb 14.4 oz (112.9 kg) IBW/kg (Calculated) : 68.4  Vital Signs: Temp: 98.1 F (36.7 C) (11/16 0635) BP: 143/56 (11/16 0635) Pulse Rate: 70 (11/16 0635)  Labs: Recent Labs    09/29/17 0440 09/30/17 0501 10/01/17 0509  HGB 13.3 11.8*  --   HCT 40.0 36.0*  --   PLT 265 240  --   LABPROT 18.1* 21.0* 27.2*  INR 1.51 1.83 2.55  CREATININE 1.97* 2.14* 2.03*   Estimated Creatinine Clearance: 37.2 mL/min (A) (by C-G formula based on SCr of 2.03 mg/dL (H)).  Assessment: Okay for Protocol. No bleeding noted. INR therapeutic.  Goal of Therapy:  INR 2-3   Plan:  Warfarin 2.5mg  PO x 1. Daily PT/INR. Monitor for signs and symptoms of bleeding.   10/03/17, BS Elder Cyphers, BCPS Clinical Pharmacist Pager [phone]  10/01/2017,8:07 AM

## 2017-10-01 NOTE — Progress Notes (Signed)
PROGRESS NOTE    [name]  PTW:[id number] DOB: June 06, 1941 DOA: 09/27/2017 PCP: [name], MD     Brief Narrative:  76 year old man admitted from home on 11/12 due to increasing pain and edema around his left foot.  He states he had a rock inside his shoe which he did not realize until it had already penetrated his foot and then he subsequently noticed redness surrounding the dorsum of the foot.  On 11/13 he lost IV access, PICC was ordered and was transitioned over to oral doxycycline.  On 11/14 he was placed back on IV antibiotics due to concern of worsening cellulitis.  As of 11/16 cellulitis looks improved.  Assessment & Plan:   Active Problems:   Hypertensive cardiovascular disease   Biventricular ICD (implantable cardioverter-defibrillator) in place   Cardiomyopathy, ischemic   Paroxysmal atrial fibrillation (HCC)   CKD (chronic kidney disease) stage 3, GFR 30-59 ml/min (HCC)   PVD (peripheral vascular disease) with claudication (HCC)   Diabetic foot infection (HCC)   Left foot cellulitis -See pictures below for details. -CT scan without evidence for osteomyelitis or abscess formation.  No indication for surgical consultation. -Good pedal pulses. -We will continue broad-spectrum antibiotic therapy consisting of vancomycin and Zosyn for now; would anticipate at least an extra 48 hours of IV antibiotics..  Acute on chronic kidney disease stage III -Continue to hold nephrotoxins, baseline creatinine is around 1.5-1.7, is 2.03 today down from 2.14 on 11/14. -Continue saline at 100 cc an hour for now.  Insulin-dependent diabetes -70/30 increased from 30-35 units twice daily on 11/14.  CBGs with improved control, continue to follow. -CBG starting to trend upwards again, follow as may need another increase in 7030 dose.  Paroxysmal atrial fibrillation -Rate controlled, continue warfarin and Coreg.  Rheumatoid arthritis -Stable, sulfasalazine has been  resumed.  Gout -Stable, continue allopurinol.  Essential hypertension -Well-controlled, continue home medications   DVT prophylaxis: Warfarin Code Status: Full code Family Communication: Discussed with wife via phone on 11/15.   disposition Plan: To be determined pending improvement of cellulitis and renal function  Consultants:   None  Procedures:   None  Antimicrobials:  Anti-infectives (From admission, onward)   Start     Dose/Rate Route Frequency Ordered Stop   09/29/17 2000  vancomycin (VANCOCIN) 1,500 mg in sodium chloride 0.9 % 500 mL IVPB  Status:  Discontinued     1,500 mg 250 mL/hr over 120 Minutes Intravenous Every 48 hours 09/27/17 1808 09/28/17 1100   09/29/17 2000  vancomycin (VANCOCIN) 1,250 mg in sodium chloride 0.9 % 250 mL IVPB     1,250 mg 166.7 mL/hr over 90 Minutes Intravenous Every 24 hours 09/29/17 1959     09/29/17 2000  piperacillin-tazobactam (ZOSYN) IVPB 3.375 g     3.375 g 12.5 mL/hr over 240 Minutes Intravenous Every 8 hours 09/29/17 1959     09/28/17 2200  doxycycline (VIBRA-TABS) tablet 100 mg  Status:  Discontinued     100 mg Oral Every 12 hours 09/28/17 1904 09/29/17 1751   09/28/17 2000  vancomycin (VANCOCIN) 1,250 mg in sodium chloride 0.9 % 250 mL IVPB  Status:  Discontinued     1,250 mg 166.7 mL/hr over 90 Minutes Intravenous Every 24 hours 09/28/17 1100 09/28/17 1904   09/27/17 2100  ceFEPIme (MAXIPIME) 2 g in dextrose 5 % 50 mL IVPB  Status:  Discontinued     2 g 100 mL/hr over 30 Minutes Intravenous Every 24 hours 09/27/17 1808 09/28/17 1904  09/27/17 2000  vancomycin (VANCOCIN) IVPB 1000 mg/200 mL premix     1,000 mg 200 mL/hr over 60 Minutes Intravenous  Once 09/27/17 1808 09/27/17 2100   09/27/17 1245  vancomycin (VANCOCIN) IVPB 1000 mg/200 mL premix     1,000 mg 200 mL/hr over 60 Minutes Intravenous  Once 09/27/17 1238 09/27/17 1440   09/27/17 1245  piperacillin-tazobactam (ZOSYN) IVPB 3.375 g     3.375 g 100 mL/hr over 30  Minutes Intravenous  Once 09/27/17 1238 09/27/17 1405       Subjective: Is concerned about slow improvement in his foot, has no true complaints.  Objective: Vitals:   09/30/17 2209 10/01/17 0635 10/01/17 0843 10/01/17 1337  BP: (!) 127/51 (!) 143/56  (!) 142/60  Pulse: 76 70  76  Resp: 17 17  16   Temp: (!) 100.6 F (38.1 C) 98.1 F (36.7 C)  98.5 F (36.9 C)  TempSrc:    Oral  SpO2: 91% 96% 92% 95%  Weight:      Height:        Intake/Output Summary (Last 24 hours) at 10/01/2017 1425 Last data filed at 10/01/2017 1407 Gross per 24 hour  Intake 2053.75 ml  Output 201 ml  Net 1852.75 ml   Filed Weights   09/27/17 1057 09/27/17 1807  Weight: 113.4 kg (250 lb) 112.9 kg (248 lb 14.4 oz)    Examination:  General exam: Alert, awake, oriented x 3 Respiratory system: Clear to auscultation. Respiratory effort normal. Cardiovascular system:RRR. No murmurs, rubs, gallops. Gastrointestinal system: Abdomen is nondistended, soft and nontender. No organomegaly or masses felt. Normal bowel sounds heard. Central nervous system: Alert and oriented. No focal neurological deficits. Extremities: See below pics of left foot cellulitis. Positive pedal pulses:        Psychiatry: Judgement and insight appear normal. Mood & affect appropriate.      Data Reviewed: I have personally reviewed following labs and imaging studies  CBC: Recent Labs  Lab 09/27/17 1147 09/28/17 0506 09/29/17 0440 09/30/17 0501  WBC 11.1* 9.1 11.6* 11.1*  NEUTROABS 9.0*  --   --   --   HGB 12.3* 13.2 13.3 11.8*  HCT 38.0* 40.1 40.0 36.0*  MCV 98.4 99.3 98.0 98.9  PLT 209 225 265 240   Basic Metabolic Panel: Recent Labs  Lab 09/27/17 1147 09/28/17 0506 09/29/17 0440 09/30/17 0501 10/01/17 0509  NA 130* 132* 129* 132* 130*  K 5.0 4.8 5.1 5.2* 5.0  CL 99* 98* 96* 98* 101  CO2 21* 25 21* 23 22  GLUCOSE 213* 156* 249* 85 204*  BUN 50* 45* 44* 49* 46*  CREATININE 2.10* 1.86* 1.97* 2.14*  2.03*  CALCIUM 9.0 9.0 9.1 8.9 8.4*  MG  --   --  2.1  --   --    GFR: Estimated Creatinine Clearance: 37.2 mL/min (A) (by C-G formula based on SCr of 2.03 mg/dL (H)). Liver Function Tests: No results for input(s): AST, ALT, ALKPHOS, BILITOT, PROT, ALBUMIN in the last 168 hours. No results for input(s): LIPASE, AMYLASE in the last 168 hours. No results for input(s): AMMONIA in the last 168 hours. Coagulation Profile: Recent Labs  Lab 09/27/17 1309 09/28/17 0506 09/29/17 0440 09/30/17 0501 10/01/17 0509  INR 1.85 1.59 1.51 1.83 2.55   Cardiac Enzymes: No results for input(s): CKTOTAL, CKMB, CKMBINDEX, TROPONINI in the last 168 hours. BNP (last 3 results) No results for input(s): PROBNP in the last 8760 hours. HbA1C: No results for input(s): HGBA1C in the last  72 hours. CBG: Recent Labs  Lab 09/30/17 1158 09/30/17 1612 09/30/17 2207 10/01/17 0740 10/01/17 1124  GLUCAP 134* 176* 203* 190* 246*   Lipid Profile: No results for input(s): CHOL, HDL, LDLCALC, TRIG, CHOLHDL, LDLDIRECT in the last 72 hours. Thyroid Function Tests: No results for input(s): TSH, T4TOTAL, FREET4, T3FREE, THYROIDAB in the last 72 hours. Anemia Panel: No results for input(s): VITAMINB12, FOLATE, FERRITIN, TIBC, IRON, RETICCTPCT in the last 72 hours. Urine analysis:    Component Value Date/Time   COLORURINE YELLOW 07/05/2014 2117   APPEARANCEUR CLEAR 07/05/2014 2117   LABSPEC 1.017 07/05/2014 2117   PHURINE 5.0 07/05/2014 2117   GLUCOSEU NEGATIVE 07/05/2014 2117   HGBUR NEGATIVE 07/05/2014 2117   BILIRUBINUR NEGATIVE 07/05/2014 2117   KETONESUR NEGATIVE 07/05/2014 2117   PROTEINUR 30 (A) 07/05/2014 2117   UROBILINOGEN 0.2 07/05/2014 2117   NITRITE NEGATIVE 07/05/2014 2117   LEUKOCYTESUR SMALL (A) 07/05/2014 2117   Sepsis Labs: @LABRCNTIP (procalcitonin:4,lacticidven:4)  ) Recent Results (from the past 240 hour(s))  Culture, blood (Routine X 2) w Reflex to ID Panel     Status: None  (Preliminary result)   Collection Time: 09/27/17  1:09 PM  Result Value Ref Range Status   Specimen Description BLOOD RIGHT ARM  Final   Special Requests   Final    BOTTLES DRAWN AEROBIC AND ANAEROBIC Blood Culture adequate volume   Culture NO GROWTH 4 DAYS  Final   Report Status PENDING  Incomplete  Culture, blood (Routine X 2) w Reflex to ID Panel     Status: None (Preliminary result)   Collection Time: 09/27/17  1:23 PM  Result Value Ref Range Status   Specimen Description BLOOD RIGHT HAND  Final   Special Requests   Final    BOTTLES DRAWN AEROBIC AND ANAEROBIC Blood Culture adequate volume   Culture NO GROWTH 4 DAYS  Final   Report Status PENDING  Incomplete         Radiology Studies: No results found.      Scheduled Meds: . allopurinol  100 mg Oral Daily  . carvedilol  12.5 mg Oral BID WC  . clopidogrel  75 mg Oral Daily  . hydrALAZINE  25 mg Oral TID  . insulin aspart protamine- aspart  35 Units Subcutaneous BID WC  . isosorbide mononitrate  30 mg Oral Daily  . levothyroxine  50 mcg Oral QAC breakfast  . loratadine  10 mg Oral Daily  . sulfaSALAzine  500 mg Oral BID  . tamsulosin  0.4 mg Oral QPC supper  . warfarin  2.5 mg Oral Once  . Warfarin - Pharmacist Dosing Inpatient   Does not apply Q24H   Continuous Infusions: . sodium chloride 100 mL/hr at 09/30/17 2053  . piperacillin-tazobactam (ZOSYN)  IV Stopped (10/01/17 1351)  . vancomycin Stopped (09/30/17 2224)     LOS: 4 days    Time spent: 25 minutes. Greater than 50% of this time was spent in direct contact with the patient coordinating care.     Chaya Jan, MD [facility] Pager [phone]  If 7PM-7AM, please contact night-coverage [email or web address] Password [email or web address] 10/01/2017, 2:25 PM

## 2017-10-02 LAB — GLUCOSE, CAPILLARY
GLUCOSE-CAPILLARY: 112 mg/dL — AB (ref 65–99)
Glucose-Capillary: 215 mg/dL — ABNORMAL HIGH (ref 65–99)
Glucose-Capillary: 127 mg/dL — ABNORMAL HIGH (ref 65–99)

## 2017-10-02 LAB — PROTIME-INR
INR: 3.22
Prothrombin Time: 32.7 seconds — ABNORMAL HIGH (ref 11.4–15.2)

## 2017-10-02 LAB — CULTURE, BLOOD (ROUTINE X 2)
CULTURE: NO GROWTH
CULTURE: NO GROWTH
SPECIAL REQUESTS: ADEQUATE
Special Requests: ADEQUATE

## 2017-10-02 LAB — VANCOMYCIN, TROUGH: VANCOMYCIN TR: 16 ug/mL (ref 15–20)

## 2017-10-02 MED ORDER — VANCOMYCIN HCL IN DEXTROSE 1-5 GM/200ML-% IV SOLN
1000.0000 mg | INTRAVENOUS | Status: DC
  Administered 2017-10-03 – 2017-10-06 (×4): 1000 mg via INTRAVENOUS
  Filled 2017-10-02 (×5): qty 200

## 2017-10-02 MED ORDER — INSULIN ASPART PROT & ASPART (70-30 MIX) 100 UNIT/ML ~~LOC~~ SUSP
38.0000 [IU] | Freq: Two times a day (BID) | SUBCUTANEOUS | Status: DC
  Administered 2017-10-03 (×2): 38 [IU] via SUBCUTANEOUS

## 2017-10-02 NOTE — Evaluation (Signed)
Physical Therapy Evaluation Patient Details Name: [name] MRN: [id number] DOB: 1941-09-24 Today's Date: 10/02/2017   History of Present Illness  [name] is a 76 y.o. male with medical history of diabetes mellitus, coronary artery disease, paroxysmal atrial fibrillation, CKD stage III, rheumatoid arthritis, ischemic cardiomyopathy status post ICD presented with 3-day history of increasing pain, edema, erythema about his left foot.  The patient stated that he stepped on a nail that went through his shoe approximately 2 weeks prior to this admission.  He stated that he had a small amount of bleeding at that time.  The patient was soaking his foot in Epsom salts over the period of 2 weeks.  However on September 23, 2017, the patient noted increasing pain and edema.  He visited emergency department on September 24, 2017.  X-rays were negative for any erosions.  The patient was sent home with ciprofloxacin and clindamycin.  The patient endorsed compliance.  However, over the past 2-3 days he has noted increasing edema, pain, and erythema streaking up the proximal aspect of the foot.  As result, he presented to the emergency department for further evaluation.  He denied any fevers, chills, chest pain, shortness breath, nausea, vomiting, diarrhea, abdominal pain, dysuria, hematuria.  He denies any headache or neck pain.  He denies any new injuries to his foot.     Clinical Impression  [name] is a 76 y.o. presenting for PT evaluation with recent decrease in functional mobility secondary to Left diabetic foot infection. He is currently functioning below his baseline of independent with no device, and now requires extra time and use of bed rails for bed mobility and min guard for transfers/gait with use of RW. He will benefit from skilled PT to address current impairments and improve functional independence for safe discharge. Patient has supportive wife who is available to assist him 24/7. Anticipate  he will be safe to discharge home with assitance from spouse when medically ready. Acute PT will follow.     Follow Up Recommendations No PT follow up    Equipment Recommendations  Rolling walker with 5" wheels    Recommendations for Other Services       Precautions / Restrictions Precautions Precautions: Fall Restrictions Weight Bearing Restrictions: No      Mobility  Bed Mobility Overal bed mobility: Modified Independent      General bed mobility comments: patient requires use of bed rail and extra time  Transfers Overall transfer level: Modified independent Equipment used: Rolling walker (2 wheeled)    General transfer comment: patient slightly impulsive, requires cues for safety  Ambulation/Gait Ambulation/Gait assistance: Min guard Ambulation Distance (Feet): 10 Feet Assistive device: Rolling walker (2 wheeled) Gait Pattern/deviations: Decreased stance time - left;Decreased weight shift to left   Gait velocity interpretation: <1.8 ft/sec, indicative of risk for recurrent falls       Balance Overall balance assessment: Needs assistance Sitting-balance support: No upper extremity supported;Feet supported Sitting balance-Leahy Scale: Good     Standing balance support: Bilateral upper extremity supported Standing balance-Leahy Scale: Fair Standing balance comment: patient reliant on RW for support       Pertinent Vitals/Pain Pain Assessment: Faces Pain Score: 7  Faces Pain Scale: Hurts a little bit Pain Descriptors / Indicators: Aching Pain Intervention(s): Limited activity within patient's tolerance;Monitored during session    Home Living Family/patient expects to be discharged to:: Private residence Living Arrangements: Spouse/significant other Available Help at Discharge: Family Type of Home: House Home Access: Stairs  to enter Entrance Stairs-Rails: None Entrance Stairs-Number of Steps: 2 Home Layout: One level Home Equipment: Walker - 2  wheels;Transport chair;Shower seat      Prior Function Level of Independence: Independent    Comments: patient enjoys going dancing 2x/week and 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 Extremity Assessment Upper Extremity Assessment: Overall WFL for tasks assessed    Lower Extremity Assessment Lower Extremity Assessment: Overall WFL for tasks assessed RLE Deficits / Details: light touch impaired proximal to ankle down to plantar surface of foot RLE Sensation: decreased light touch LLE Deficits / Details: light touch impaired proximal to ankle down to plantar surface of foot LLE Sensation: decreased light touch    Cervical / Trunk Assessment Cervical / Trunk Assessment: Kyphotic  Communication   Communication: No difficulties  Cognition Arousal/Alertness: Awake/alert Behavior During Therapy: WFL for tasks assessed/performed Overall Cognitive Status: Within Functional Limits for tasks assessed  General Comments: patient spouse stating he has been lethargic since taking pain medication      General Comments General comments (skin integrity, edema, etc.): LLE foot with erythema on dorasal aspect of foot and dark patch on sole of foot at base of metatarsals    Exercises General Exercises - Lower Extremity Ankle Circles/Pumps: AROM;Both;10 reps Straight Leg Raises: AROM;Supine;Both;5 reps   Assessment/Plan    PT Assessment Patient needs continued PT services  PT Problem List Decreased strength;Decreased knowledge of use of DME;Decreased activity tolerance;Decreased skin integrity;Decreased balance;Decreased safety awareness;Pain;Decreased mobility;Impaired sensation       PT Treatment Interventions DME instruction;Balance training;Gait training;Stair training;Functional mobility training;Patient/family education;Therapeutic activities;Therapeutic exercise    PT Goals (Current goals can be found in the Care Plan section)  Acute Rehab PT  Goals Patient Stated Goal: get better to go back home and get back to dancing and fixing my cars PT Goal Formulation: With patient/family Time For Goal Achievement: 10/08/17 Potential to Achieve Goals: Good    Frequency Min 3X/week    AM-PAC PT "6 Clicks" Daily Activity  Outcome Measure Difficulty turning over in bed (including adjusting bedclothes, sheets and blankets)?: A Lot Difficulty moving from lying on back to sitting on the side of the bed? : A Lot Difficulty sitting down on and standing up from a chair with arms (e.g., wheelchair, bedside commode, etc,.)?: A Little Help needed moving to and from a bed to chair (including a wheelchair)?: A Little Help needed walking in hospital room?: A Little Help needed climbing 3-5 steps with a railing? : A Lot 6 Click Score: 15    End of Session   Activity Tolerance: Patient tolerated treatment well Patient left: in bed   PT Visit Diagnosis: Unsteadiness on feet (R26.81);Difficulty in walking, not elsewhere classified (R26.2);Other abnormalities of gait and mobility (R26.89);Muscle weakness (generalized) (M62.81);Pain Pain - Right/Left: Left Pain - part of body: Ankle and joints of foot    Time: 0737-1062 PT Time Calculation (min) (ACUTE ONLY): 38 min   Charges:   PT Evaluation $PT Eval Low Complexity: 1 Low PT Treatments $Therapeutic Exercise: 8-22 mins $Therapeutic Activity: 8-22 mins       Glyn Ade, PT, DPT Physical Therapist with [facility]  10/02/2017 1:03 PM

## 2017-10-02 NOTE — Progress Notes (Signed)
ANTICOAGULATION CONSULT NOTE  Pharmacy Consult for Warfarin Indication: atrial fibrillation  Allergies  Allergen Reactions  . Allegra [Fexofenadine]   . Biaxin [Clarithromycin]   . Crestor [Rosuvastatin Calcium]   . Glucophage [Metformin Hcl]   . Trimethoprim   . Codeine Other (See Comments)    blisters, but able to take hydrocodone   Patient Measurements: Height: 5\' 8"  (172.7 cm) Weight: 248 lb 14.4 oz (112.9 kg) IBW/kg (Calculated) : 68.4  Vital Signs: Temp: 99.4 F (37.4 C) (11/17 0552) BP: 176/73 (11/17 0552) Pulse Rate: 78 (11/17 0552)  Labs: Recent Labs    09/30/17 0501 10/01/17 0509 10/02/17 0624  HGB 11.8*  --   --   HCT 36.0*  --   --   PLT 240  --   --   LABPROT 21.0* 27.2* 32.7*  INR 1.83 2.55 3.22  CREATININE 2.14* 2.03*  --    Estimated Creatinine Clearance: 37.2 mL/min (A) (by C-G formula based on SCr of 2.03 mg/dL (H)).  Assessment: Okay for Protocol. No bleeding noted. INR supratherapeutic today.  Goal of Therapy:  INR 2-3   Plan:  No coumadin today Daily PT/INR. Monitor for signs and symptoms of bleeding.   10/04/17, BS Pharm D, BCPS Clinical Pharmacist Pager [phone]  10/02/2017,9:18 AM

## 2017-10-02 NOTE — Progress Notes (Addendum)
Pharmacy Antibiotic Note  [name] is a 76 y.o. male admitted on 09/27/2017 with wound infection. He lost IV access and was switched to po doxycycline but cellulitis worsened and IV antibiotics vancomycin and zosyn were resumed. D#4 of Vancomycin, tmax 99.4.CT scan without evidence for osteomyelitis or abscess formation.   Plan: Vancomycin trough level today prior to dose at 1900  Continue Vancomycin to 1250mg  IV every 24 hours.  Goal trough 10-15 mcg/mL.  Continue Zosyn 3.375 gm IV q8 hours Monitor labs, micro and vitals.  Height: 5\' 8"  (172.7 cm) Weight: 248 lb 14.4 oz (112.9 kg) IBW/kg (Calculated) : 68.4  Temp (24hrs), Avg:98.7 F (37.1 C), Min:98.2 F (36.8 C), Max:99.4 F (37.4 C)  Recent Labs  Lab 09/27/17 1147 09/28/17 0506 09/29/17 0440 09/30/17 0501 10/01/17 0509  WBC 11.1* 9.1 11.6* 11.1*  --   CREATININE 2.10* 1.86* 1.97* 2.14* 2.03*    Obesity/Normalized CrCl Vancomycin dosing protocol will be initiated with an estimated normalized CrCl = 30 ml/min.   Estimated Creatinine Clearance: 37.2 mL/min (A) (by C-G formula based on SCr of 2.03 mg/dL (H)).    Allergies  Allergen Reactions  . Allegra [Fexofenadine]   . Biaxin [Clarithromycin]   . Crestor [Rosuvastatin Calcium]   . Glucophage [Metformin Hcl]   . Trimethoprim   . Codeine Other (See Comments)    blisters, but able to take hydrocodone   Antimicrobials this admission: Vanc 11/12 >> 11/12, 11/14>> Cefepime 11/12 >> 11/12 Zosyn x 1 11/12, 11/14>>  Microbiology results: 11/12 BCx: ngtd   Thank you for allowing pharmacy to be a part of this patient's care.  13/12, BS 13/12, BCPS Clinical Pharmacist Pager [phone]  Addendum:  Vancomycin tr= 74mcg/ml. Goal trough 10-80mcg/ml Plan:Adjust dose 1000mg  IV q24h for trough around 36mcg/ml  12m, BS , 19m Clinical Pharmacist Pager [phone]  10/02/2017 9:22 AM

## 2017-10-02 NOTE — Progress Notes (Signed)
PROGRESS NOTE    [name]  KXF:[id number] DOB: 1941-05-03 DOA: 09/27/2017 PCP: [name], MD   Brief Narrative:  76 year old man admitted from home on 11/12 due to increasing pain and edema around his left foot.  He states he had a rock inside his shoe which he did not realize until it had already penetrated his foot and then he subsequently noticed redness surrounding the dorsum of the foot.  On 11/13 he lost IV access, PICC was ordered and was transitioned over to oral doxycycline.  On 11/14 he was placed back on IV antibiotics due to concern of worsening cellulitis.  As of 11/16 cellulitis looks improved.  Assessment & Plan:   Active Problems:   Hypertensive cardiovascular disease   Biventricular ICD (implantable cardioverter-defibrillator) in place   Cardiomyopathy, ischemic   Paroxysmal atrial fibrillation (HCC)   CKD (chronic kidney disease) stage 3, GFR 30-59 ml/min (HCC)   PVD (peripheral vascular disease) with claudication (HCC)   Diabetic foot infection (HCC)   Left foot cellulitis -See pictures below for details. -CT scan without evidence for osteomyelitis or abscess formation.  No indication for surgical consultation. -Good pedal pulses. -We will continue broad-spectrum antibiotic therapy consisting of vancomycin and Zosyn -Encouraged elevation of extremity when in bed  Acute on chronic kidney disease stage III -Continue to hold nephrotoxins, baseline creatinine is around 1.5-1.7, is 2.03 today down from 2.14 on 11/14. -Continue saline at 100 cc an hour for now.  Insulin-dependent diabetes -70/30 increased from 30-35 units twice daily on 11/14.  CBGs with improved control, continue to follow. -increased 70/30 to 38 units BID 11/17  Paroxysmal atrial fibrillation -Rate controlled, continue warfarin and Coreg.  Rheumatoid arthritis -Stable, sulfasalazine has been resumed.  Gout -Stable, continue allopurinol.  Essential hypertension -Well-controlled,  continue home medications   DVT prophylaxis: Warfarin Code Status: Full code Family Communication: Discussed with wife via phone on 11/15, 11/17 Disposition Plan: ambulate with PT today   Consultants:   None  Procedures:   None  Antimicrobials:  Anti-infectives (From admission, onward)   Start     Dose/Rate Route Frequency Ordered Stop   09/29/17 2000  vancomycin (VANCOCIN) 1,500 mg in sodium chloride 0.9 % 500 mL IVPB  Status:  Discontinued     1,500 mg 250 mL/hr over 120 Minutes Intravenous Every 48 hours 09/27/17 1808 09/28/17 1100   09/29/17 2000  vancomycin (VANCOCIN) 1,250 mg in sodium chloride 0.9 % 250 mL IVPB     1,250 mg 166.7 mL/hr over 90 Minutes Intravenous Every 24 hours 09/29/17 1959     09/29/17 2000  piperacillin-tazobactam (ZOSYN) IVPB 3.375 g     3.375 g 12.5 mL/hr over 240 Minutes Intravenous Every 8 hours 09/29/17 1959     09/28/17 2200  doxycycline (VIBRA-TABS) tablet 100 mg  Status:  Discontinued     100 mg Oral Every 12 hours 09/28/17 1904 09/29/17 1751   09/28/17 2000  vancomycin (VANCOCIN) 1,250 mg in sodium chloride 0.9 % 250 mL IVPB  Status:  Discontinued     1,250 mg 166.7 mL/hr over 90 Minutes Intravenous Every 24 hours 09/28/17 1100 09/28/17 1904   09/27/17 2100  ceFEPIme (MAXIPIME) 2 g in dextrose 5 % 50 mL IVPB  Status:  Discontinued     2 g 100 mL/hr over 30 Minutes Intravenous Every 24 hours 09/27/17 1808 09/28/17 1904   09/27/17 2000  vancomycin (VANCOCIN) IVPB 1000 mg/200 mL premix     1,000 mg 200 mL/hr over 60 Minutes  Intravenous  Once 09/27/17 1808 09/27/17 2100   09/27/17 1245  vancomycin (VANCOCIN) IVPB 1000 mg/200 mL premix     1,000 mg 200 mL/hr over 60 Minutes Intravenous  Once 09/27/17 1238 09/27/17 1440   09/27/17 1245  piperacillin-tazobactam (ZOSYN) IVPB 3.375 g     3.375 g 100 mL/hr over 30 Minutes Intravenous  Once 09/27/17 1238 09/27/17 1405     Subjective: Pt says that he has not been walking on foot much but has  been able to ambulate to bathroom.   Objective: Vitals:   10/01/17 1337 10/01/17 2007 10/01/17 2108 10/02/17 0552  BP: (!) 142/60  (!) 164/70 (!) 176/73  Pulse: 76  80 78  Resp: 16  20 20   Temp: 98.5 F (36.9 C)  98.2 F (36.8 C) 99.4 F (37.4 C)  TempSrc: Oral  Oral   SpO2: 95% 92% 93% 94%  Weight:      Height:        Intake/Output Summary (Last 24 hours) at 10/02/2017 0903 Last data filed at 10/02/2017 0553 Gross per 24 hour  Intake 981.67 ml  Output 1201 ml  Net -219.33 ml   Filed Weights   09/27/17 1057 09/27/17 1807  Weight: 113.4 kg (250 lb) 112.9 kg (248 lb 14.4 oz)    Examination:  General exam: Alert, awake, oriented x 3 Respiratory system: Clear to auscultation. Respiratory effort normal. Cardiovascular system:RRR. No murmurs, rubs, gallops. Gastrointestinal system: Abdomen is obese,nondistended, soft and nontender. No organomegaly or masses felt. Normal bowel sounds heard. Central nervous system: Alert and oriented. No focal neurological deficits. Extremities: Positive pedal pulses: blister forming on bottom of left foot (ball of foot)    Psychiatry: Judgement and insight appear normal. Mood & affect appropriate.   Data Reviewed: I have personally reviewed following labs and imaging studies  CBC: Recent Labs  Lab 09/27/17 1147 09/28/17 0506 09/29/17 0440 09/30/17 0501  WBC 11.1* 9.1 11.6* 11.1*  NEUTROABS 9.0*  --   --   --   HGB 12.3* 13.2 13.3 11.8*  HCT 38.0* 40.1 40.0 36.0*  MCV 98.4 99.3 98.0 98.9  PLT 209 225 265 240   Basic Metabolic Panel: Recent Labs  Lab 09/27/17 1147 09/28/17 0506 09/29/17 0440 09/30/17 0501 10/01/17 0509  NA 130* 132* 129* 132* 130*  K 5.0 4.8 5.1 5.2* 5.0  CL 99* 98* 96* 98* 101  CO2 21* 25 21* 23 22  GLUCOSE 213* 156* 249* 85 204*  BUN 50* 45* 44* 49* 46*  CREATININE 2.10* 1.86* 1.97* 2.14* 2.03*  CALCIUM 9.0 9.0 9.1 8.9 8.4*  MG  --   --  2.1  --   --    GFR: Estimated Creatinine Clearance: 37.2  mL/min (A) (by C-G formula based on SCr of 2.03 mg/dL (H)). Liver Function Tests: No results for input(s): AST, ALT, ALKPHOS, BILITOT, PROT, ALBUMIN in the last 168 hours. No results for input(s): LIPASE, AMYLASE in the last 168 hours. No results for input(s): AMMONIA in the last 168 hours. Coagulation Profile: Recent Labs  Lab 09/28/17 0506 09/29/17 0440 09/30/17 0501 10/01/17 0509 10/02/17 0624  INR 1.59 1.51 1.83 2.55 3.22   Cardiac Enzymes: No results for input(s): CKTOTAL, CKMB, CKMBINDEX, TROPONINI in the last 168 hours. BNP (last 3 results) No results for input(s): PROBNP in the last 8760 hours. HbA1C: No results for input(s): HGBA1C in the last 72 hours. CBG: Recent Labs  Lab 10/01/17 0740 10/01/17 1124 10/01/17 1638 10/01/17 2111 10/02/17 0736  GLUCAP 190*  246* 245* 227* 215*   Lipid Profile: No results for input(s): CHOL, HDL, LDLCALC, TRIG, CHOLHDL, LDLDIRECT in the last 72 hours. Thyroid Function Tests: No results for input(s): TSH, T4TOTAL, FREET4, T3FREE, THYROIDAB in the last 72 hours. Anemia Panel: No results for input(s): VITAMINB12, FOLATE, FERRITIN, TIBC, IRON, RETICCTPCT in the last 72 hours. Urine analysis:    Component Value Date/Time   COLORURINE YELLOW 07/05/2014 2117   APPEARANCEUR CLEAR 07/05/2014 2117   LABSPEC 1.017 07/05/2014 2117   PHURINE 5.0 07/05/2014 2117   GLUCOSEU NEGATIVE 07/05/2014 2117   HGBUR NEGATIVE 07/05/2014 2117   BILIRUBINUR NEGATIVE 07/05/2014 2117   KETONESUR NEGATIVE 07/05/2014 2117   PROTEINUR 30 (A) 07/05/2014 2117   UROBILINOGEN 0.2 07/05/2014 2117   NITRITE NEGATIVE 07/05/2014 2117   LEUKOCYTESUR SMALL (A) 07/05/2014 2117    Recent Results (from the past 240 hour(s))  Culture, blood (Routine X 2) w Reflex to ID Panel     Status: None   Collection Time: 09/27/17  1:09 PM  Result Value Ref Range Status   Specimen Description BLOOD RIGHT ARM  Final   Special Requests   Final    BOTTLES DRAWN AEROBIC AND  ANAEROBIC Blood Culture adequate volume   Culture NO GROWTH 5 DAYS  Final   Report Status 10/02/2017 FINAL  Final  Culture, blood (Routine X 2) w Reflex to ID Panel     Status: None   Collection Time: 09/27/17  1:23 PM  Result Value Ref Range Status   Specimen Description BLOOD RIGHT HAND  Final   Special Requests   Final    BOTTLES DRAWN AEROBIC AND ANAEROBIC Blood Culture adequate volume   Culture NO GROWTH 5 DAYS  Final   Report Status 10/02/2017 FINAL  Final    Radiology Studies: No results found.  Scheduled Meds: . allopurinol  100 mg Oral Daily  . carvedilol  12.5 mg Oral BID WC  . clopidogrel  75 mg Oral Daily  . hydrALAZINE  25 mg Oral TID  . insulin aspart protamine- aspart  35 Units Subcutaneous BID WC  . isosorbide mononitrate  30 mg Oral Daily  . levothyroxine  50 mcg Oral QAC breakfast  . loratadine  10 mg Oral Daily  . sulfaSALAzine  500 mg Oral BID  . tamsulosin  0.4 mg Oral QPC supper  . Warfarin - Pharmacist Dosing Inpatient   Does not apply Q24H   Continuous Infusions: . sodium chloride 100 mL/hr at 10/02/17 0710  . piperacillin-tazobactam (ZOSYN)  IV 3.375 g (10/02/17 0449)  . vancomycin Stopped (10/01/17 2234)     LOS: 5 days    Time spent: 25 minutes. Greater than 50% of this time was spent in direct contact with the patient coordinating care.  Standley Dakins, MD [facility] Pager [phone]  If 7PM-7AM, please contact night-coverage [email or web address] Password [email or web address] 10/02/2017, 9:03 AM

## 2017-10-03 ENCOUNTER — Inpatient Hospital Stay (HOSPITAL_COMMUNITY): Payer: PPO

## 2017-10-03 LAB — CBC
HEMATOCRIT: 31.1 % — AB (ref 39.0–52.0)
Hemoglobin: 9.7 g/dL — ABNORMAL LOW (ref 13.0–17.0)
MCH: 31.4 pg (ref 26.0–34.0)
MCHC: 31.2 g/dL (ref 30.0–36.0)
MCV: 100.6 fL — ABNORMAL HIGH (ref 78.0–100.0)
PLATELETS: 271 10*3/uL (ref 150–400)
RBC: 3.09 MIL/uL — ABNORMAL LOW (ref 4.22–5.81)
RDW: 14.2 % (ref 11.5–15.5)
WBC: 12.3 10*3/uL — ABNORMAL HIGH (ref 4.0–10.5)

## 2017-10-03 LAB — BASIC METABOLIC PANEL
Anion gap: 7 (ref 5–15)
BUN: 30 mg/dL — AB (ref 6–20)
CO2: 20 mmol/L — ABNORMAL LOW (ref 22–32)
CREATININE: 1.82 mg/dL — AB (ref 0.61–1.24)
Calcium: 8.3 mg/dL — ABNORMAL LOW (ref 8.9–10.3)
Chloride: 105 mmol/L (ref 101–111)
GFR calc Af Amer: 40 mL/min — ABNORMAL LOW (ref >60)
GFR, EST NON AFRICAN AMERICAN: 34 mL/min — AB (ref >60)
GLUCOSE: 141 mg/dL — AB (ref 65–99)
POTASSIUM: 4.8 mmol/L (ref 3.5–5.1)
Sodium: 132 mmol/L — ABNORMAL LOW (ref 135–145)

## 2017-10-03 LAB — PROTIME-INR
INR: 3.25
PROTHROMBIN TIME: 32.9 s — AB (ref 11.4–15.2)

## 2017-10-03 LAB — GLUCOSE, CAPILLARY
GLUCOSE-CAPILLARY: 188 mg/dL — AB (ref 65–99)
Glucose-Capillary: 134 mg/dL — ABNORMAL HIGH (ref 65–99)
Glucose-Capillary: 135 mg/dL — ABNORMAL HIGH (ref 65–99)
Glucose-Capillary: 198 mg/dL — ABNORMAL HIGH (ref 65–99)

## 2017-10-03 NOTE — Progress Notes (Signed)
ANTICOAGULATION CONSULT NOTE  Pharmacy Consult for Warfarin Indication: atrial fibrillation  Allergies  Allergen Reactions  . Allegra [Fexofenadine]   . Biaxin [Clarithromycin]   . Crestor [Rosuvastatin Calcium]   . Glucophage [Metformin Hcl]   . Trimethoprim   . Codeine Other (See Comments)    blisters, but able to take hydrocodone   Patient Measurements: Height: 5\' 8"  (172.7 cm) Weight: 183 lb 10.3 oz (83.3 kg) IBW/kg (Calculated) : 68.4  Vital Signs: Temp: 99.2 F (37.3 C) (11/18 0817) Temp Source: Oral (11/18 0817) BP: 177/75 (11/18 0817) Pulse Rate: 67 (11/18 0817)  Labs: Recent Labs    10/01/17 0509 10/02/17 0624 10/03/17 0623  HGB  --   --  9.7*  HCT  --   --  31.1*  PLT  --   --  271  LABPROT 27.2* 32.7* 32.9*  INR 2.55 3.22 3.25  CREATININE 2.03*  --  1.82*   Estimated Creatinine Clearance: 35.8 mL/min (A) (by C-G formula based on SCr of 1.82 mg/dL (H)).  Assessment: Okay for Protocol. No bleeding noted. INR remains supratherapeutic today.  Goal of Therapy:  INR 2-3   Plan:  No coumadin today Daily PT/INR. Monitor for signs and symptoms of bleeding.   10/05/17, BS Elder Cyphers, BCPS Clinical Pharmacist Pager [phone]  10/03/2017,8:52 AM

## 2017-10-03 NOTE — Progress Notes (Signed)
PROGRESS NOTE    [name]  CLE:[id number] DOB: 06-Oct-1941 DOA: 09/27/2017 PCP: [name], MD   Brief Narrative:  76 year old man admitted from home on 11/12 due to increasing pain and edema around his left foot.  He states he had a rock inside his shoe which he did not realize until it had already penetrated his foot and then he subsequently noticed redness surrounding the dorsum of the foot.  On 11/13 he lost IV access, PICC was ordered and was transitioned over to oral doxycycline.  On 11/14 he was placed back on IV antibiotics due to concern of worsening cellulitis.  As of 11/16 cellulitis looks improved.  Assessment & Plan:   Active Problems:   Hypertensive cardiovascular disease   Biventricular ICD (implantable cardioverter-defibrillator) in place   Cardiomyopathy, ischemic   Paroxysmal atrial fibrillation (HCC)   CKD (chronic kidney disease) stage 3, GFR 30-59 ml/min (HCC)   PVD (peripheral vascular disease) with claudication (HCC)   Diabetic foot infection (HCC)  Left foot cellulitis -clinically improving. -CT scan without evidence for osteomyelitis or abscess formation.  No indication for surgical consultation. -Good pedal pulses. -We will continue broad-spectrum antibiotic therapy with vancomycin and Zosyn -Encouraged elevation of extremity when in bed - check xray left foot today  Acute on chronic kidney disease stage III -Continue to hold nephrotoxins, baseline creatinine is around 1.5-1.7, is 1.82 down from 2.14 on 11/14. -gentle IVFs  Insulin-dependent diabetes -70/30 increased from 30-35 units twice daily on 11/14.  CBGs with improved control, continue to follow. -increased 70/30 to 38 units BID 11/17  Paroxysmal atrial fibrillation -Rate controlled, continue warfarin and Coreg.  Rheumatoid arthritis -Stable, sulfasalazine has been resumed.  Gout -Stable, continue allopurinol.  Essential hypertension -Well-controlled, continue home  medications   DVT prophylaxis: Warfarin Code Status: Full code Family Communication: Discussed with wife via phone on 11/15, 11/17 Disposition Plan: ambulate with PT    Consultants:   None  Procedures:   None  Antimicrobials:  Anti-infectives (From admission, onward)   Start     Dose/Rate Route Frequency Ordered Stop   10/03/17 2100  vancomycin (VANCOCIN) IVPB 1000 mg/200 mL premix     1,000 mg 200 mL/hr over 60 Minutes Intravenous Every 24 hours 10/02/17 2058     09/29/17 2000  vancomycin (VANCOCIN) 1,500 mg in sodium chloride 0.9 % 500 mL IVPB  Status:  Discontinued     1,500 mg 250 mL/hr over 120 Minutes Intravenous Every 48 hours 09/27/17 1808 09/28/17 1100   09/29/17 2000  vancomycin (VANCOCIN) 1,250 mg in sodium chloride 0.9 % 250 mL IVPB  Status:  Discontinued     1,250 mg 166.7 mL/hr over 90 Minutes Intravenous Every 24 hours 09/29/17 1959 10/02/17 2058   09/29/17 2000  piperacillin-tazobactam (ZOSYN) IVPB 3.375 g     3.375 g 12.5 mL/hr over 240 Minutes Intravenous Every 8 hours 09/29/17 1959     09/28/17 2200  doxycycline (VIBRA-TABS) tablet 100 mg  Status:  Discontinued     100 mg Oral Every 12 hours 09/28/17 1904 09/29/17 1751   09/28/17 2000  vancomycin (VANCOCIN) 1,250 mg in sodium chloride 0.9 % 250 mL IVPB  Status:  Discontinued     1,250 mg 166.7 mL/hr over 90 Minutes Intravenous Every 24 hours 09/28/17 1100 09/28/17 1904   09/27/17 2100  ceFEPIme (MAXIPIME) 2 g in dextrose 5 % 50 mL IVPB  Status:  Discontinued     2 g 100 mL/hr over 30 Minutes Intravenous Every 24  hours 09/27/17 1808 09/28/17 1904   09/27/17 2000  vancomycin (VANCOCIN) IVPB 1000 mg/200 mL premix     1,000 mg 200 mL/hr over 60 Minutes Intravenous  Once 09/27/17 1808 09/27/17 2100   09/27/17 1245  vancomycin (VANCOCIN) IVPB 1000 mg/200 mL premix     1,000 mg 200 mL/hr over 60 Minutes Intravenous  Once 09/27/17 1238 09/27/17 1440   09/27/17 1245  piperacillin-tazobactam (ZOSYN) IVPB 3.375 g      3.375 g 100 mL/hr over 30 Minutes Intravenous  Once 09/27/17 1238 09/27/17 1405     Subjective: Pt ambulated well yesterday, pain is improving per patient.   Objective: Vitals:   10/02/17 1300 10/02/17 2115 10/03/17 0515 10/03/17 0817  BP: (!) 154/53 (!) 156/61 (!) 174/69 (!) 177/75  Pulse: 77 74 74 67  Resp: 18 18 18    Temp: 98.7 F (37.1 C) 98 F (36.7 C) 98.9 F (37.2 C) 99.2 F (37.3 C)  TempSrc: Oral Oral Oral Oral  SpO2: 97% 92% 92% 100%  Weight:   83.3 kg (183 lb 10.3 oz)   Height:        Intake/Output Summary (Last 24 hours) at 10/03/2017 0901 Last data filed at 10/03/2017 0649 Gross per 24 hour  Intake 240 ml  Output 550 ml  Net -310 ml   Filed Weights   09/27/17 1057 09/27/17 1807 10/03/17 0515  Weight: 113.4 kg (250 lb) 112.9 kg (248 lb 14.4 oz) 83.3 kg (183 lb 10.3 oz)    Examination:  General exam: Alert, awake, oriented x 3 Respiratory system: Clear to auscultation. Respiratory effort normal. Cardiovascular system:RRR. No murmurs, rubs, gallops. Gastrointestinal system: Abdomen is obese,nondistended, soft and nontender. No organomegaly or masses felt. Normal bowel sounds heard. Central nervous system: Alert and oriented. No focal neurological deficits. Extremities: Positive pedal pulses: large blood filled blister forming on bottom of left foot (ball of foot)    Psychiatry: Judgement and insight appear normal. Mood & affect appropriate.   Data Reviewed: I have personally reviewed following labs and imaging studies  CBC: Recent Labs  Lab 09/27/17 1147 09/28/17 0506 09/29/17 0440 09/30/17 0501 10/03/17 0623  WBC 11.1* 9.1 11.6* 11.1* 12.3*  NEUTROABS 9.0*  --   --   --   --   HGB 12.3* 13.2 13.3 11.8* 9.7*  HCT 38.0* 40.1 40.0 36.0* 31.1*  MCV 98.4 99.3 98.0 98.9 100.6*  PLT 209 225 265 240 271   Basic Metabolic Panel: Recent Labs  Lab 09/28/17 0506 09/29/17 0440 09/30/17 0501 10/01/17 0509 10/03/17 0623  NA 132* 129* 132* 130*  132*  K 4.8 5.1 5.2* 5.0 4.8  CL 98* 96* 98* 101 105  CO2 25 21* 23 22 20*  GLUCOSE 156* 249* 85 204* 141*  BUN 45* 44* 49* 46* 30*  CREATININE 1.86* 1.97* 2.14* 2.03* 1.82*  CALCIUM 9.0 9.1 8.9 8.4* 8.3*  MG  --  2.1  --   --   --    GFR: Estimated Creatinine Clearance: 35.8 mL/min (A) (by C-G formula based on SCr of 1.82 mg/dL (H)). Liver Function Tests: No results for input(s): AST, ALT, ALKPHOS, BILITOT, PROT, ALBUMIN in the last 168 hours. No results for input(s): LIPASE, AMYLASE in the last 168 hours. No results for input(s): AMMONIA in the last 168 hours. Coagulation Profile: Recent Labs  Lab 09/29/17 0440 09/30/17 0501 10/01/17 0509 10/02/17 0624 10/03/17 0623  INR 1.51 1.83 2.55 3.22 3.25   Cardiac Enzymes: No results for input(s): CKTOTAL, CKMB, CKMBINDEX, TROPONINI  in the last 168 hours. BNP (last 3 results) No results for input(s): PROBNP in the last 8760 hours. HbA1C: No results for input(s): HGBA1C in the last 72 hours. CBG: Recent Labs  Lab 10/01/17 2111 10/02/17 0736 10/02/17 1632 10/02/17 2132 10/03/17 0735  GLUCAP 227* 215* 112* 127* 134*   Lipid Profile: No results for input(s): CHOL, HDL, LDLCALC, TRIG, CHOLHDL, LDLDIRECT in the last 72 hours. Thyroid Function Tests: No results for input(s): TSH, T4TOTAL, FREET4, T3FREE, THYROIDAB in the last 72 hours. Anemia Panel: No results for input(s): VITAMINB12, FOLATE, FERRITIN, TIBC, IRON, RETICCTPCT in the last 72 hours. Urine analysis:    Component Value Date/Time   COLORURINE YELLOW 07/05/2014 2117   APPEARANCEUR CLEAR 07/05/2014 2117   LABSPEC 1.017 07/05/2014 2117   PHURINE 5.0 07/05/2014 2117   GLUCOSEU NEGATIVE 07/05/2014 2117   HGBUR NEGATIVE 07/05/2014 2117   BILIRUBINUR NEGATIVE 07/05/2014 2117   KETONESUR NEGATIVE 07/05/2014 2117   PROTEINUR 30 (A) 07/05/2014 2117   UROBILINOGEN 0.2 07/05/2014 2117   NITRITE NEGATIVE 07/05/2014 2117   LEUKOCYTESUR SMALL (A) 07/05/2014 2117     Recent Results (from the past 240 hour(s))  Culture, blood (Routine X 2) w Reflex to ID Panel     Status: None   Collection Time: 09/27/17  1:09 PM  Result Value Ref Range Status   Specimen Description BLOOD RIGHT ARM  Final   Special Requests   Final    BOTTLES DRAWN AEROBIC AND ANAEROBIC Blood Culture adequate volume   Culture NO GROWTH 5 DAYS  Final   Report Status 10/02/2017 FINAL  Final  Culture, blood (Routine X 2) w Reflex to ID Panel     Status: None   Collection Time: 09/27/17  1:23 PM  Result Value Ref Range Status   Specimen Description BLOOD RIGHT HAND  Final   Special Requests   Final    BOTTLES DRAWN AEROBIC AND ANAEROBIC Blood Culture adequate volume   Culture NO GROWTH 5 DAYS  Final   Report Status 10/02/2017 FINAL  Final    Radiology Studies: No results found.  Scheduled Meds: . allopurinol  100 mg Oral Daily  . carvedilol  12.5 mg Oral BID WC  . clopidogrel  75 mg Oral Daily  . hydrALAZINE  25 mg Oral TID  . insulin aspart protamine- aspart  38 Units Subcutaneous BID WC  . isosorbide mononitrate  30 mg Oral Daily  . levothyroxine  50 mcg Oral QAC breakfast  . loratadine  10 mg Oral Daily  . sulfaSALAzine  500 mg Oral BID  . tamsulosin  0.4 mg Oral QPC supper  . Warfarin - Pharmacist Dosing Inpatient   Does not apply Q24H   Continuous Infusions: . sodium chloride 100 mL/hr at 10/03/17 0400  . piperacillin-tazobactam (ZOSYN)  IV 3.375 g (10/03/17 0410)  . vancomycin       LOS: 6 days   Time spent: 25 minutes.  Standley Dakins, MD [facility] Pager [phone]  If 7PM-7AM, please contact night-coverage [email or web address] Password [email or web address] 10/03/2017, 9:01 AM

## 2017-10-04 ENCOUNTER — Inpatient Hospital Stay (HOSPITAL_COMMUNITY): Payer: PPO

## 2017-10-04 ENCOUNTER — Telehealth: Payer: Self-pay | Admitting: Cardiology

## 2017-10-04 ENCOUNTER — Encounter (HOSPITAL_COMMUNITY): Payer: Self-pay | Admitting: Gastroenterology

## 2017-10-04 ENCOUNTER — Encounter: Payer: PPO | Admitting: *Deleted

## 2017-10-04 DIAGNOSIS — K920 Hematemesis: Secondary | ICD-10-CM

## 2017-10-04 LAB — CBC
HEMATOCRIT: 22.3 % — AB (ref 39.0–52.0)
HEMATOCRIT: 23.1 % — AB (ref 39.0–52.0)
HEMOGLOBIN: 7 g/dL — AB (ref 13.0–17.0)
HEMOGLOBIN: 7.6 g/dL — AB (ref 13.0–17.0)
MCH: 31.7 pg (ref 26.0–34.0)
MCH: 31.4 pg (ref 26.0–34.0)
MCHC: 31.4 g/dL (ref 30.0–36.0)
MCHC: 32.9 g/dL (ref 30.0–36.0)
MCV: 100.9 fL — AB (ref 78.0–100.0)
MCV: 95.5 fL (ref 78.0–100.0)
Platelets: 350 10*3/uL (ref 150–400)
Platelets: 273 10*3/uL (ref 150–400)
RBC: 2.21 MIL/uL — ABNORMAL LOW (ref 4.22–5.81)
RBC: 2.42 MIL/uL — AB (ref 4.22–5.81)
RDW: 14.1 % (ref 11.5–15.5)
RDW: 16.5 % — ABNORMAL HIGH (ref 11.5–15.5)
WBC: 19.2 10*3/uL — AB (ref 4.0–10.5)
WBC: 18.3 10*3/uL — AB (ref 4.0–10.5)

## 2017-10-04 LAB — BASIC METABOLIC PANEL
ANION GAP: 5 (ref 5–15)
ANION GAP: 4 — AB (ref 5–15)
BUN: 33 mg/dL — AB (ref 6–20)
BUN: 44 mg/dL — ABNORMAL HIGH (ref 6–20)
CHLORIDE: 109 mmol/L (ref 101–111)
CO2: 20 mmol/L — ABNORMAL LOW (ref 22–32)
CO2: 21 mmol/L — ABNORMAL LOW (ref 22–32)
Calcium: 7.9 mg/dL — ABNORMAL LOW (ref 8.9–10.3)
Calcium: 8.2 mg/dL — ABNORMAL LOW (ref 8.9–10.3)
Chloride: 110 mmol/L (ref 101–111)
Creatinine, Ser: 2.08 mg/dL — ABNORMAL HIGH (ref 0.61–1.24)
Creatinine, Ser: 1.91 mg/dL — ABNORMAL HIGH (ref 0.61–1.24)
GFR calc Af Amer: 37 mL/min — ABNORMAL LOW (ref >60)
GFR, EST AFRICAN AMERICAN: 34 mL/min — AB (ref >60)
GFR, EST NON AFRICAN AMERICAN: 29 mL/min — AB (ref >60)
GFR, EST NON AFRICAN AMERICAN: 32 mL/min — AB (ref >60)
GLUCOSE: 222 mg/dL — AB (ref 65–99)
Glucose, Bld: 236 mg/dL — ABNORMAL HIGH (ref 65–99)
POTASSIUM: 5.5 mmol/L — AB (ref 3.5–5.1)
POTASSIUM: 4.8 mmol/L (ref 3.5–5.1)
SODIUM: 135 mmol/L (ref 135–145)
Sodium: 134 mmol/L — ABNORMAL LOW (ref 135–145)

## 2017-10-04 LAB — GLUCOSE, CAPILLARY
GLUCOSE-CAPILLARY: 140 mg/dL — AB (ref 65–99)
GLUCOSE-CAPILLARY: 224 mg/dL — AB (ref 65–99)
Glucose-Capillary: 250 mg/dL — ABNORMAL HIGH (ref 65–99)
Glucose-Capillary: 239 mg/dL — ABNORMAL HIGH (ref 65–99)
Glucose-Capillary: 221 mg/dL — ABNORMAL HIGH (ref 65–99)

## 2017-10-04 LAB — ABO/RH: ABO/RH(D): A POS

## 2017-10-04 LAB — PROTIME-INR
INR: 4.52
Prothrombin Time: 42.9 seconds — ABNORMAL HIGH (ref 11.4–15.2)

## 2017-10-04 LAB — MRSA PCR SCREENING: MRSA BY PCR: NEGATIVE

## 2017-10-04 LAB — PREPARE RBC (CROSSMATCH)

## 2017-10-04 LAB — OCCULT BLOOD X 1 CARD TO LAB, STOOL: Fecal Occult Bld: POSITIVE — AB

## 2017-10-04 MED ORDER — INSULIN GLARGINE 100 UNIT/ML ~~LOC~~ SOLN
25.0000 [IU] | Freq: Every day | SUBCUTANEOUS | Status: DC
  Administered 2017-10-04: 25 [IU] via SUBCUTANEOUS
  Filled 2017-10-04 (×2): qty 0.25

## 2017-10-04 MED ORDER — PANTOPRAZOLE SODIUM 40 MG IV SOLR
INTRAVENOUS | Status: AC
  Filled 2017-10-04: qty 160

## 2017-10-04 MED ORDER — SODIUM CHLORIDE 0.9 % IV BOLUS (SEPSIS)
1000.0000 mL | INTRAVENOUS | Status: AC
  Administered 2017-10-04: 1000 mL via INTRAVENOUS

## 2017-10-04 MED ORDER — PANTOPRAZOLE SODIUM 40 MG IV SOLR
8.0000 mg/h | INTRAVENOUS | Status: AC
  Administered 2017-10-04 – 2017-10-06 (×6): 8 mg/h via INTRAVENOUS
  Filled 2017-10-04 (×11): qty 80

## 2017-10-04 MED ORDER — FUROSEMIDE 10 MG/ML IJ SOLN
20.0000 mg | Freq: Once | INTRAMUSCULAR | Status: AC
  Administered 2017-10-04: 20 mg via INTRAVENOUS
  Filled 2017-10-04: qty 2

## 2017-10-04 MED ORDER — SODIUM CHLORIDE 0.9 % IV SOLN
80.0000 mg | Freq: Once | INTRAVENOUS | Status: AC
  Administered 2017-10-04: 06:00:00 80 mg via INTRAVENOUS
  Filled 2017-10-04: qty 80

## 2017-10-04 MED ORDER — SODIUM CHLORIDE 0.9 % IV SOLN: Freq: Once | INTRAVENOUS | Status: DC

## 2017-10-04 MED ORDER — VITAMIN K1 10 MG/ML IJ SOLN
INTRAMUSCULAR | Status: AC
  Filled 2017-10-04: qty 1

## 2017-10-04 MED ORDER — PANTOPRAZOLE SODIUM 40 MG IV SOLR: 40.0000 mg | Freq: Two times a day (BID) | INTRAVENOUS | Status: DC

## 2017-10-04 MED ORDER — DEXTROSE 5 % IV SOLN
5.0000 mg | INTRAVENOUS | Status: AC
  Administered 2017-10-04: 5 mg via INTRAVENOUS
  Filled 2017-10-04: qty 0.5

## 2017-10-04 MED ORDER — INSULIN ASPART 100 UNIT/ML ~~LOC~~ SOLN
0.0000 [IU] | Freq: Three times a day (TID) | SUBCUTANEOUS | Status: DC
  Administered 2017-10-04 – 2017-10-05 (×4): 3 [IU] via SUBCUTANEOUS

## 2017-10-04 NOTE — Consult Note (Signed)
Referring Provider: Dr. Madelyn Flavors  Primary Care Physician:  [name], MD Primary Gastroenterologist:  Dr. Jena Gauss   Date of Admission: 09/27/17 Date of Consultation: 10/04/17  Reason for Consultation: GI bleed   HPI:  [name] is a 76 y.o. year old male admitted with left foot cellulitis, acute on chronic kidney disease, multiple medical issues to include afib on Coumadin. He is also on Plavix with history of CAD, stenting in 2014. GI consulted due to acute onset of hematochezia and hematemesis in setting of supratherapeutic INR, which was 4.52 today, up from 3.25 yesterday. Pharmacy has been following during hospitalization for anticoagulation management, and Coumadin had been held for past 2 days, with last dose on 11/16.    Around 0415 am this morning had bright red bloody bowel movement, hematemesis, and hypotensive with systolic in the 70s. He  received 5 mg Vit K, fluid bolus. Hgb 13 several days ago and drifting to 11 range recently, with Hgb 9.7 yesterday and down to 7 range this morning, emergent blood released, receiving 1st unit by 0645.  Due to emergency of situation, it appears the total that was ordered was 3 units. FFP also ordered, 3 units total. Appears he may have received 1 FFP already.   No known liver disease. Last imaging on file of abdomen July 2014 without evidence of liver disease. No thrombocytopenia.   Hemodynamically improved from this morning. He is drowsy but easily awakens. Wife at bedside. No further bleeding since initial incidence. Denies abdominal pain. Mild nausea noted. No prior history of reflux, dysphagia. Colonoscopy reportedly by Dr. Ewing Schlein in remote past, which I can't find in the medical records. Wife believes he may have had an endoscopy in [place] years ago. Denies any outpatient use of NSAIDs, aspirin powders. Chronically on Plavix and Coumadin as outpatient. No concerning lower or upper GI symptoms noted as outpatient prior to admission.   Past  Medical History:  Diagnosis Date  . Anemia   . Anemia-chronic    a. Pt reports h/o anemia - was offered IV iron in past by nephrologist but declined and has taken PO instead.  . Aneurysm, cerebral   . BPH (benign prostatic hypertrophy)   . Cardiomyopathy, ischemic    LVEF 25-35%.  . Chronic kidney disease, stage III (moderate) (HCC)    Followed by Dr. Fausto Skillern.  . Coronary atherosclerosis of native coronary artery    a. DES to RCA 03/2009. b. s/p PTCA to RCA for ISR, 05/2010. c. 03/2013 NSTEMI 2/2 severe prox RCA s/p PTCA/DES, med rx for residual dz.  . Essential hypertension, benign   . Hyperlipidemia   . LBBB (left bundle branch block)    s/p BiV ICD Implanted by Dr Graciela Husbands  . Morbid obesity (HCC)   . MRSA (methicillin resistant Staphylococcus aureus)   . Myocardial infarction (HCC)   . Paroxysmal atrial fibrillation (HCC)    a. Coumadin discontinue in 2010 when the patient required ASA/Plavix for stenting. b. Coumadin restarted 05/2013, Plavix continued, ASA stopped.   . Pulmonary nodule    CXR, 03/2013, [facility] - not described on any subsequent chest x-rays however, could be a vascular structure  . TIA (transient ischemic attack)    Multiple TIAs  . Type 2 diabetes mellitus (HCC)     Past Surgical History:  Procedure Laterality Date  . BiV ICD Generator Changeout N/A 03/18/2017   Performed by Hillis Range, MD at [facility] INVASIVE CV LAB  . CHOLECYSTECTOMY    . ICD placement  05/06/2011   [employer] CRT-D  . LEFT HEART CATHETERIZATION WITH CORONARY ANGIOGRAM N/A 04/17/2013   Performed by Elease Hashimoto Deloris Ping, MD at [facility] CATH LAB  . LUNG SURGERY     24 - 25 yrs. old  . TOTAL KNEE ARTHROPLASTY    . VASECTOMY      Prior to Admission medications   Medication Sig Start Date End Date Taking? Authorizing Provider  allopurinol (ZYLOPRIM) 100 MG tablet Take 100 mg by mouth daily.   Yes [provider]  carvedilol (COREG) 12.5 MG tablet TAKE 1 AND 1/2 TABLETS BY MOUTH TWICE DAILY WITH MEALS  (DUE FOR FOLLOW UP) 06/30/17  Yes Jonelle Sidle, MD  ciprofloxacin (CIPRO) 500 MG tablet Take 1 tablet (500 mg total) every 12 (twelve) hours by mouth. 09/24/17  Yes Eber Hong, MD  clindamycin (CLEOCIN) 150 MG capsule Take 2 capsules (300 mg total) 3 (three) times daily by mouth. May dispense as 150mg  capsules 09/24/17  Yes 13/9/18, MD  clopidogrel (PLAVIX) 75 MG tablet Take 1 tablet (75 mg total) by mouth daily. 03/25/17  Yes Allred, 05/25/17, MD  hydrALAZINE (APRESOLINE) 25 MG tablet TAKE ONE TABLET BY MOUTH THREE TIMES DAILY 07/20/17  Yes 09/19/17, MD  HYDROcodone-acetaminophen (NORCO) 7.5-325 MG tablet Take 1 tablet by mouth 4 (four) times daily as needed for moderate pain.   Yes [provider]  insulin lispro protamine-insulin lispro (HUMALOG 75/25) (75-25) 100 UNIT/ML SUSP Inject 22-35 Units into the skin See admin instructions. Takes 35 units in the morning with breakfast and 35 units at supper (Will take 22-25 at lunch time occasionally)   Yes [provider]  isosorbide mononitrate (IMDUR) 30 MG 24 hr tablet TAKE ONE TABLET BY MOUTH ONCE DAILY 05/28/17  Yes 05/30/17, MD  levothyroxine (SYNTHROID, LEVOTHROID) 50 MCG tablet Take 50 mcg by mouth daily. 06/29/17  Yes [provider]  lisinopril (PRINIVIL,ZESTRIL) 2.5 MG tablet Take 2.5 mg by mouth daily.   Yes [provider]  loratadine (CLARITIN) 10 MG tablet Take 10 mg by mouth daily.   Yes [provider]  nitroGLYCERIN (NITROSTAT) 0.4 MG SL tablet Place 0.4 mg under the tongue every 5 (five) minutes as needed for chest pain. For chest pain 04/26/13  Yes Serpe, 06/26/13, PA-C  sulfaSALAzine (AZULFIDINE EN-TABS) 500 MG EC tablet Take 1 tablet (500 mg total) by mouth 2 (two) times daily. 03/30/17  Yes Deveshwar, 04/01/17, MD  tamsulosin (FLOMAX) 0.4 MG CAPS capsule Take 0.4 mg by mouth.   Yes [provider]  torsemide (DEMADEX) 10 MG tablet Take 1 tablet (10 mg total)  by mouth 2 (two) times daily. 12/30/15  Yes 01/01/16, MD  warfarin (COUMADIN) 5 MG tablet Take 0.5-1 tablets (2.5-5 mg total) by mouth daily at 6 PM. Takes 2.5mg  on Sunday, Monday, Wednesday and Friday  Takes 5mg  on Tuesday, Thursday and Sunday 03/19/17  Yes Allred, Monday, MD  Zinc 50 MG CAPS Take 50 mg by mouth daily.    Yes [provider]  doxycycline (VIBRA-TABS) 100 MG tablet Take 1 tablet (100 mg total) by mouth 2 (two) times daily. Patient not taking: Reported on 09/27/2017 08/04/17   13/10/2017, DPM    Current Facility-Administered Medications  Medication Dose Route Frequency Provider Last Rate Last Dose  . 0.9 %  sodium chloride infusion   Intravenous Continuous Johnson, Clanford L, MD 100 mL/hr at 10/04/17 0748    . 0.9 %  sodium chloride infusion  Intravenous Once Madelyn Flavors A, MD      . acetaminophen (TYLENOL) tablet 650 mg  650 mg Oral Q6H PRN Tat, David, MD       Or  . acetaminophen (TYLENOL) suppository 650 mg  650 mg Rectal Q6H PRN Tat, Onalee Hua, MD      . allopurinol (ZYLOPRIM) tablet 100 mg  100 mg Oral Daily Tat, David, MD   100 mg at 10/04/17 0932  . carvedilol (COREG) tablet 12.5 mg  12.5 mg Oral BID WC Tat, David, MD   12.5 mg at 10/04/17 0932  . hydrALAZINE (APRESOLINE) tablet 25 mg  25 mg Oral TID Catarina Hartshorn, MD   25 mg at 10/04/17 0932  . HYDROcodone-acetaminophen (NORCO) 7.5-325 MG per tablet 1 tablet  1 tablet Oral QID PRN Tat, Onalee Hua, MD   1 tablet at 10/03/17 0827  . hydrOXYzine (ATARAX/VISTARIL) tablet 10 mg  10 mg Oral TID PRN Laural Benes, Clanford L, MD   10 mg at 10/01/17 1213  . insulin aspart (novoLOG) injection 0-9 Units  0-9 Units Subcutaneous TID WC Laural Benes, Clanford L, MD   3 Units at 10/04/17 0931  . isosorbide mononitrate (IMDUR) 24 hr tablet 30 mg  30 mg Oral Daily Tat, David, MD   30 mg at 10/04/17 0932  . levothyroxine (SYNTHROID, LEVOTHROID) tablet 50 mcg  50 mcg Oral QAC breakfast Tat, Onalee Hua, MD   50 mcg at 10/04/17 0932  .  loratadine (CLARITIN) tablet 10 mg  10 mg Oral Daily Philip Aspen, Limmie Patricia, MD   10 mg at 10/04/17 0933  . ondansetron (ZOFRAN) tablet 4 mg  4 mg Oral Q6H PRN Tat, David, MD       Or  . ondansetron (ZOFRAN) injection 4 mg  4 mg Intravenous Q6H PRN Catarina Hartshorn, MD   4 mg at 10/04/17 0541  . pantoprazole (PROTONIX) 80 mg in sodium chloride 0.9 % 250 mL (0.32 mg/mL) infusion  8 mg/hr Intravenous Continuous Madelyn Flavors A, MD 25 mL/hr at 10/04/17 0637 8 mg/hr at 10/04/17 0637  . [START ON 10/07/2017] pantoprazole (PROTONIX) injection 40 mg  40 mg Intravenous Q12H Smith, Rondell A, MD      . piperacillin-tazobactam (ZOSYN) IVPB 3.375 g  3.375 g Intravenous Q8H Philip Aspen, Limmie Patricia, MD   Stopped at 10/04/17 [phone]  . sodium chloride (OCEAN) 0.65 % nasal spray 2 spray  2 spray Each Nare PRN Philip Aspen, Limmie Patricia, MD   2 spray at 09/29/17 1824  . tamsulosin (FLOMAX) capsule 0.4 mg  0.4 mg Oral QPC supper Tat, Onalee Hua, MD   0.4 mg at 10/03/17 1709  . vancomycin (VANCOCIN) IVPB 1000 mg/200 mL premix  1,000 mg Intravenous Q24H Cleora Fleet, MD   Stopped at 10/03/17 2125  . Warfarin - Pharmacist Dosing Inpatient   Does not apply Q24H Catarina Hartshorn, MD        Allergies as of 09/27/2017 - Review Complete 09/27/2017  Allergen Reaction Noted  . Allegra [fexofenadine]  08/03/2017  . Biaxin [clarithromycin]  08/03/2017  . Crestor [rosuvastatin calcium]  08/03/2017  . Glucophage [metformin hcl]  08/03/2017  . Trimethoprim  08/03/2017  . Codeine Other (See Comments)     Family History  Problem Relation Age of Onset  . Diabetes Mother        Bilateral leg amputation  . Heart disease Mother        Before age 66  . Hypertension Mother   . Liver disease Mother  fatty liver   . Cancer Father        Prostate and Bone  . Diabetes Father   . Cancer Brother        Prostate  . Heart disease Other        family h/o premature cardiovascular disease  . Cancer Sister        Colon   .  Hypertension Sister     Social History   Socioeconomic History  . Marital status: Married    Spouse name: Not on file  . Number of children: Not on file  . Years of education: Not on file  . Highest education level: Not on file  Social Needs  . Financial resource strain: Not on file  . Food insecurity - worry: Not on file  . Food insecurity - inability: Not on file  . Transportation needs - medical: Not on file  . Transportation needs - non-medical: Not on file  Occupational History  . Not on file  Tobacco Use  . Smoking status: Former Smoker    Packs/day: 1.00    Years: 6.00    Pack years: 6.00    Types: Cigarettes    Start date: 12/08/1949    Last attempt to quit: 11/16/1964    Years since quitting: 52.9  . Smokeless tobacco: Former Neurosurgeon    Quit date: 04/15/1965  Substance and Sexual Activity  . Alcohol use: Yes    Alcohol/week: 0.0 oz    Comment: once/month  . Drug use: No  . Sexual activity: Not on file  Other Topics Concern  . Not on file  Social History Narrative  . Not on file    Review of Systems: Gen: Denies fever, chills, loss of appetite, change in weight or weight loss CV: Denies chest pain, heart palpitations, syncope, edema  Resp: Denies shortness of breath with rest, cough, wheezing GI: see HPI  GU : Denies urinary burning, urinary frequency, urinary incontinence.  MS: see HPI  Derm: Denies rash, itching, dry skin Psych: Denies depression, anxiety,confusion, or memory loss Heme: see HPI   Physical Exam: Vital signs in last 24 hours: Temp:  [97.7 F (36.5 C)-101.1 F (38.4 C)] 97.7 F (36.5 C) (11/19 0942) Pulse Rate:  [78-89] 79 (11/19 0942) Resp:  [13-22] 16 (11/19 0942) BP: (75-187)/(33-91) 142/91 (11/19 0942) SpO2:  [94 %-100 %] 100 % (11/19 0942) Weight:  [263 lb 7.2 oz (119.5 kg)] 263 lb 7.2 oz (119.5 kg) (11/19 0615) Last BM Date: 10/04/17 General:   Alert, resting with eyes closed but easily awakens.  Head:  Normocephalic and  atraumatic. Eyes:  Sclera clear, no icterus.    Ears:  Normal auditory acuity. Mouth:  No deformity or lesions Lungs:  Scattered rhonchi Heart: S1 S2 present, irregularly irregular  Abdomen:  Soft, obese, non-tender. No rebound or guarding. No HSM appreciated Rectal:  Deferred  Extremities:  LLE with edema greater than RLE, cellulitis noted to dorsal part of foot. Ball of foot with purplish discoloration  Neurologic:  Alert and  oriented x4 Psych:  Alert and cooperative. Normal mood and affect.  Intake/Output from previous day: 11/18 0701 - 11/19 0700 In: 2584.6 [P.O.:360; I.V.:1874.6; IV Piggyback:350] Out: 602 [Urine:600; Stool:2] Intake/Output this shift: Total I/O In: 1271.7 [I.V.:491.7; Blood:580; IV Piggyback:200] Out: -   Lab Results: Recent Labs    10/03/17 0623 10/04/17 0451  WBC 12.3* 19.2*  HGB 9.7* 7.0*  HCT 31.1* 22.3*  PLT 271 350   BMET Recent Labs  10/03/17 0623 10/04/17 0451  NA 132* 134*  K 4.8 5.5*  CL 105 109  CO2 20* 20*  GLUCOSE 141* 222*  BUN 30* 33*  CREATININE 1.82* 2.08*  CALCIUM 8.3* 7.9*   PT/INR Recent Labs    10/03/17 0623 10/04/17 0451  LABPROT 32.9* 42.9*  INR 3.25 4.52*    Studies/Results: Dg Chest Port 1 View  Result Date: 10/04/2017 CLINICAL DATA:  Hematemesis. EXAM: PORTABLE CHEST 1 VIEW COMPARISON:  06/11/2013 FINDINGS: 0740 hours. Low lung volumes. Cardiopericardial silhouette is at upper limits of normal for size. Left pacer/ AICD again noted. Right PICC line tip is positioned over the mid SVC level. Telemetry leads overlie the chest. IMPRESSION: Low volume film without acute cardiopulmonary findings. Electronically Signed   By: Kennith Center M.D.   On: 10/04/2017 07:58   Dg Foot 2 Views Left  Result Date: 10/03/2017 CLINICAL DATA:  Left foot pain, history of diabetic foot infection EXAM: LEFT FOOT - 2 VIEW COMPARISON:  09/28/2017 FINDINGS: Considerable soft tissue swelling is noted along the distal aspect of the  foot slightly increased when compared with the prior exam. Again no radiopaque foreign body is seen. The osseous structures show some degenerative change similar to that seen on the prior exam. No acute fracture or dislocation is noted. No bony erosion to suggest osteomyelitis is seen. IMPRESSION: Increasing soft tissue cellulitis in the distal foot when compared with the prior exam. Again no radiopaque foreign body or acute bony abnormality is seen. Electronically Signed   By: Alcide Clever M.D.   On: 10/03/2017 14:11    Impression: 76 year old male on Plavix and Coumadin, supratherapeutic INR,  with acute onset of likely rapid transit upper GI bleed, with noted hematemesis and hematochezia, hypotension, acute blood loss anemia. Currently without further GI bleed and improvement hemodynamically. Coumadin has been on hold for past two days, with last dose 11/16. Plavix now discontinued. Resuscitation measures including PRBCs and FFP, both still in process. Vit K received this morning. Will need an EGD with Propofol in future once INR has drifted down. For now, continue supportive measures, PPI infusion, and transfusion as necessary. Continue to hold Coumadin and Plavix. As of note, he has no reported history of liver disease or suspicion for liver disease, and he denies any outpatient NSAIDs, aspirin powders prior to admission. From what I can gather, his last EGD was several years ago in [place], but we do not have these records at this time.   Plan: Continue PPI infusion Hold Coumadin and Plavix Follow INR Serial H/H Transfuse as necessary Will continue to follow with you and plan on EGD when clinically appropriate   Gelene Mink, PhD, ANP-BC [facility] Gastroenterology     LOS: 7 days    10/04/2017, 10:16 AM

## 2017-10-04 NOTE — Progress Notes (Signed)
Emergent issue and transfusion of blood. Blood issued to MD and this nurse. Blood product checked and verified by K. Maisie Fus RN and Marjo Bicker RN . Unable to document in flowsheets due to emergent nature of situation.

## 2017-10-04 NOTE — Progress Notes (Signed)
First unit of blood completed at 0645. Unable to scan and chart against due to being administered emergently. Post vital signs taken per blood administration protocol. Patient tolerated well with no signs or symptoms of complications. Will continue to monitor the patient closely.

## 2017-10-04 NOTE — Progress Notes (Addendum)
Paged at 4:15 AM for possible bloody bowel movement.  Blood occult ordered and type and screen as hemoglobin previously noted to be 9.7 the previous day with INR 3.25.  Stat page to come to bedside at 5:06 AM for bloody bowel movement hematemesis. Bp noted as low as 74/54. Pt diaphoreticc and pale. Urgent release of 1 unit of blood, 5 mg of Vit K, bolus 1 liter of NS IVFs ordered. Transferred Pt to ICU.  6:00am INR came back at 4.5 and Hbg 7. Orders to Transfuse 3 units of FFP, Transfuse additional units of Blood(total of 2 units), Lasix 20 mg IV following 1st unit of blood. MAP now >65

## 2017-10-04 NOTE — Consult Note (Signed)
Reason for Consult: Left foot cellulitis Referring Physician: Dr. Joanie Coddington is an 76 y.o. male.  HPI: Patient is a 76 year old white male with multiple medical problems who presents with a 3-week history of having a palpable puncture the plantar surface of his left foot.  He did not seek medical attention at that time.  He presented to the emergency room with worsening left foot pain.  X-rays of the left foot reveal no foreign body present.  He has sought podiatry consultation in the remote past.  He currently does not actively being cared for by a podiatrist.  He does have sensation in the left foot.  He currently has a pain of 7 out of 10 when putting pressure on the left foot.  He is anticoagulated due to a history of cardiomyopathy.  INR is significantly elevated.  He is about to receive blood transfusions for anemia and possible GI bleed.  Past Medical History:  Diagnosis Date  . Anemia   . Anemia-chronic    a. Pt reports h/o anemia - was offered IV iron in past by nephrologist but declined and has taken PO instead.  . Aneurysm, cerebral   . BPH (benign prostatic hypertrophy)   . Cardiomyopathy, ischemic    LVEF 25-35%.  . Chronic kidney disease, stage III (moderate) (HCC)    Followed by Dr. Hinda Lenis.  . Coronary atherosclerosis of native coronary artery    a. DES to RCA 03/2009. b. s/p PTCA to RCA for ISR, 05/2010. c. 03/2013 NSTEMI 2/2 severe prox RCA s/p PTCA/DES, med rx for residual dz.  . Essential hypertension, benign   . Hyperlipidemia   . LBBB (left bundle branch block)    s/p BiV ICD Implanted by Dr Caryl Comes  . Morbid obesity ([place])   . MRSA (methicillin resistant Staphylococcus aureus)   . Myocardial infarction ([place])   . Paroxysmal atrial fibrillation ([place])    a. Coumadin discontinue in 2010 when the patient required ASA/Plavix for stenting. b. Coumadin restarted 05/2013, Plavix continued, ASA stopped.   . Pulmonary nodule    CXR, 03/2013, [facility] - not described on any  subsequent chest x-rays however, could be a vascular structure  . TIA (transient ischemic attack)    Multiple TIAs  . Type 2 diabetes mellitus ([place])     Past Surgical History:  Procedure Laterality Date  . BiV ICD Generator Changeout N/A 03/18/2017   Performed by Thompson Grayer, MD at [facility]  . CHOLECYSTECTOMY    . ICD placement  05/06/2011   [employer] CRT-D  . LEFT HEART CATHETERIZATION WITH CORONARY ANGIOGRAM N/A 04/17/2013   Performed by Thayer Headings, MD at [facility]  . LUNG SURGERY     23 - 24 yrs. old  . TOTAL KNEE ARTHROPLASTY    . VASECTOMY      Family History  Problem Relation Age of Onset  . Diabetes Mother        Bilateral leg amputation  . Heart disease Mother        Before age 4  . Hypertension Mother   . Liver disease Mother        fatty liver   . Cancer Father        Prostate and Bone  . Diabetes Father   . Cancer Brother        Prostate  . Heart disease Other        family h/o premature cardiovascular disease  . Cancer Sister  Colon   . Hypertension Sister     Social History:  reports that he quit smoking about 52 years ago. His smoking use included cigarettes. He started smoking about 67 years ago. He has a 6.00 pack-year smoking history. He quit smokeless tobacco use about 52 years ago. He reports that he drinks alcohol. He reports that he does not use drugs.  Allergies:  Allergies  Allergen Reactions  . Allegra [Fexofenadine]   . Biaxin [Clarithromycin]   . Crestor [Rosuvastatin Calcium]   . Glucophage [Metformin Hcl]   . Trimethoprim   . Codeine Other (See Comments)    blisters, but able to take hydrocodone    Medications: I have reviewed the patient's current medications.  Results for orders placed or performed during the hospital encounter of 09/27/17 (from the past 48 hour(s))  Glucose, capillary     Status: Abnormal   Collection Time: 10/02/17 11:58 AM  Result Value Ref Range   Glucose-Capillary 140 (H) 65 - 99  mg/dL  Glucose, capillary     Status: Abnormal   Collection Time: 10/02/17  4:32 PM  Result Value Ref Range   Glucose-Capillary 112 (H) 65 - 99 mg/dL   Comment 1 Notify RN    Comment 2 Document in Chart   Vancomycin, trough     Status: None   Collection Time: 10/02/17  7:49 PM  Result Value Ref Range   Vancomycin Tr 16 15 - 20 ug/mL  Glucose, capillary     Status: Abnormal   Collection Time: 10/02/17  9:32 PM  Result Value Ref Range   Glucose-Capillary 127 (H) 65 - 99 mg/dL   Comment 1 Notify RN    Comment 2 Document in Chart   Protime-INR     Status: Abnormal   Collection Time: 10/03/17  6:23 AM  Result Value Ref Range   Prothrombin Time 32.9 (H) 11.4 - 15.2 seconds   INR 0.35   Basic metabolic panel     Status: Abnormal   Collection Time: 10/03/17  6:23 AM  Result Value Ref Range   Sodium 132 (L) 135 - 145 mmol/L   Potassium 4.8 3.5 - 5.1 mmol/L   Chloride 105 101 - 111 mmol/L   CO2 20 (L) 22 - 32 mmol/L   Glucose, Bld 141 (H) 65 - 99 mg/dL   BUN 30 (H) 6 - 20 mg/dL   Creatinine, Ser 1.82 (H) 0.61 - 1.24 mg/dL   Calcium 8.3 (L) 8.9 - 10.3 mg/dL   GFR calc non Af Amer 34 (L) >60 mL/min   GFR calc Af Amer 40 (L) >60 mL/min    Comment: (NOTE) The eGFR has been calculated using the CKD EPI equation. This calculation has not been validated in all clinical situations. eGFR's persistently <60 mL/min signify possible Chronic Kidney Disease.    Anion gap 7 5 - 15  CBC     Status: Abnormal   Collection Time: 10/03/17  6:23 AM  Result Value Ref Range   WBC 12.3 (H) 4.0 - 10.5 K/uL   RBC 3.09 (L) 4.22 - 5.81 MIL/uL   Hemoglobin 9.7 (L) 13.0 - 17.0 g/dL   HCT 31.1 (L) 39.0 - 52.0 %   MCV 100.6 (H) 78.0 - 100.0 fL   MCH 31.4 26.0 - 34.0 pg   MCHC 31.2 30.0 - 36.0 g/dL   RDW 14.2 11.5 - 15.5 %   Platelets 271 150 - 400 K/uL  Glucose, capillary     Status: Abnormal   Collection  Time: 10/03/17  7:35 AM  Result Value Ref Range   Glucose-Capillary 134 (H) 65 - 99 mg/dL   Glucose, capillary     Status: Abnormal   Collection Time: 10/03/17 11:53 AM  Result Value Ref Range   Glucose-Capillary 135 (H) 65 - 99 mg/dL   Comment 1 Notify RN    Comment 2 Document in Chart   Glucose, capillary     Status: Abnormal   Collection Time: 10/03/17  4:09 PM  Result Value Ref Range   Glucose-Capillary 198 (H) 65 - 99 mg/dL   Comment 1 Document in Chart   Glucose, capillary     Status: Abnormal   Collection Time: 10/03/17  9:01 PM  Result Value Ref Range   Glucose-Capillary 188 (H) 65 - 99 mg/dL   Comment 1 Notify RN    Comment 2 Document in Chart   Occult blood card to lab, stool     Status: Abnormal   Collection Time: 10/04/17  4:24 AM  Result Value Ref Range   Fecal Occult Bld POSITIVE (A) NEGATIVE  Protime-INR     Status: Abnormal   Collection Time: 10/04/17  4:51 AM  Result Value Ref Range   Prothrombin Time 42.9 (H) 11.4 - 15.2 seconds   INR 4.52 (HH)     Comment: RESULT REPEATED AND VERIFIED CRITICAL RESULT CALLED TO, READ BACK BY AND VERIFIED WITH: THOMAS,K AT 0602 BY HUFFINES,S ON 10/04/17.   Basic metabolic panel     Status: Abnormal   Collection Time: 10/04/17  4:51 AM  Result Value Ref Range   Sodium 134 (L) 135 - 145 mmol/L   Potassium 5.5 (H) 3.5 - 5.1 mmol/L   Chloride 109 101 - 111 mmol/L   CO2 20 (L) 22 - 32 mmol/L   Glucose, Bld 222 (H) 65 - 99 mg/dL   BUN 33 (H) 6 - 20 mg/dL   Creatinine, Ser 2.08 (H) 0.61 - 1.24 mg/dL   Calcium 7.9 (L) 8.9 - 10.3 mg/dL   GFR calc non Af Amer 29 (L) >60 mL/min   GFR calc Af Amer 34 (L) >60 mL/min    Comment: (NOTE) The eGFR has been calculated using the CKD EPI equation. This calculation has not been validated in all clinical situations. eGFR's persistently <60 mL/min signify possible Chronic Kidney Disease.    Anion gap 5 5 - 15  CBC     Status: Abnormal   Collection Time: 10/04/17  4:51 AM  Result Value Ref Range   WBC 19.2 (H) 4.0 - 10.5 K/uL   RBC 2.21 (L) 4.22 - 5.81 MIL/uL   Hemoglobin  7.0 (L) 13.0 - 17.0 g/dL    Comment: DELTA CHECK NOTED   HCT 22.3 (L) 39.0 - 52.0 %   MCV 100.9 (H) 78.0 - 100.0 fL   MCH 31.7 26.0 - 34.0 pg   MCHC 31.4 30.0 - 36.0 g/dL   RDW 14.1 11.5 - 15.5 %   Platelets 350 150 - 400 K/uL  Type and screen [facility]     Status: None (Preliminary result)   Collection Time: 10/04/17  4:51 AM  Result Value Ref Range   ABO/RH(D) A POS    Antibody Screen NEG    Sample Expiration 10/07/2017    Unit Number [id number]    Blood Component Type RBC LR PHER2    Unit division 00    Status of Unit ISSUED    Transfusion Status PENDING    Crossmatch Result PENDING  Unit Number [id number]    Blood Component Type RED CELLS,LR    Unit division 00    Status of Unit ISSUED    Transfusion Status OK TO TRANSFUSE    Crossmatch Result Compatible   Prepare RBC     Status: None   Collection Time: 10/04/17  4:51 AM  Result Value Ref Range   Order Confirmation ORDER PROCESSED BY BLOOD BANK   ABO/Rh     Status: None   Collection Time: 10/04/17  4:51 AM  Result Value Ref Range   ABO/RH(D) A POS   Prepare fresh frozen plasma     Status: None (Preliminary result)   Collection Time: 10/04/17  6:09 AM  Result Value Ref Range   Unit Number [id number]    Blood Component Type THAWED PLASMA    Unit division 00    Status of Unit ISSUED    Transfusion Status OK TO TRANSFUSE    Unit Number [id number]    Blood Component Type THAWED PLASMA    Unit division 00    Status of Unit ALLOCATED    Transfusion Status OK TO TRANSFUSE   Glucose, capillary     Status: Abnormal   Collection Time: 10/04/17  7:07 AM  Result Value Ref Range   Glucose-Capillary 224 (H) 65 - 99 mg/dL    Dg Chest Port 1 View  Result Date: 10/04/2017 CLINICAL DATA:  Hematemesis. EXAM: PORTABLE CHEST 1 VIEW COMPARISON:  06/11/2013 FINDINGS: 0740 hours. Low lung volumes. Cardiopericardial silhouette is at upper limits of normal for size. Left pacer/ AICD again noted. Right PICC  line tip is positioned over the mid SVC level. Telemetry leads overlie the chest. IMPRESSION: Low volume film without acute cardiopulmonary findings. Electronically Signed   By: Misty Stanley M.D.   On: 10/04/2017 07:58   Dg Foot 2 Views Left  Result Date: 10/03/2017 CLINICAL DATA:  Left foot pain, history of diabetic foot infection EXAM: LEFT FOOT - 2 VIEW COMPARISON:  09/28/2017 FINDINGS: Considerable soft tissue swelling is noted along the distal aspect of the foot slightly increased when compared with the prior exam. Again no radiopaque foreign body is seen. The osseous structures show some degenerative change similar to that seen on the prior exam. No acute fracture or dislocation is noted. No bony erosion to suggest osteomyelitis is seen. IMPRESSION: Increasing soft tissue cellulitis in the distal foot when compared with the prior exam. Again no radiopaque foreign body or acute bony abnormality is seen. Electronically Signed   By: Inez Catalina M.D.   On: 10/03/2017 14:11    ROS:  Pertinent items are noted in HPI.  Blood pressure (!) 142/91, pulse 79, temperature 97.7 F (36.5 C), temperature source Oral, resp. rate 16, height '5\' 8"'  (1.727 m), weight 263 lb 7.2 oz (119.5 kg), SpO2 100 %. Physical Exam: Pleasant well-developed well-nourished white male no acute distress Head is normocephalic, atraumatic Lungs clear to auscultation with equal breath sounds bilaterally Heart examination reveals regular rate and rhythm without S3, S4, murmurs Extremity examination reveals an easily palpable left femoral pulse.  Vaguely palpable pedal pulses are noted, with bruising noted both on the dorsum of the left foot at the base of the toes with a collapsing blood blister noted along the sole of the foot.  No purulent drainage is present.  Sensation is intact.  No open ulcerations are noted. Assessment/Plan: Impression: Cellulitis of left foot without abscess formation.  No foreign body noted in the left  foot.  Elevated INR.  Leukocytosis present. Plan: Continue IV antibiotics.  Will get segmental Doppler studies to assess for peripheral vascular disease.  Further management pending those results.  No need for acute surgical intervention at this time.  Aviva Signs 10/04/2017, 10:20 AM

## 2017-10-04 NOTE — Progress Notes (Signed)
PROGRESS NOTE    [name]  GYK:[id number] DOB: [birth year] DOA: 09/27/2017 PCP: [name], MD   Brief Narrative:  76 year old man admitted from home on 11/12 due to increasing pain and edema around his left foot.  He states he had a rock inside his shoe which he did not realize until it had already penetrated his foot and then he subsequently noticed redness surrounding the dorsum of the foot.  On 11/13 he lost IV access, PICC was ordered and was transitioned over to oral doxycycline.  On 11/14 he was placed back on IV antibiotics due to concern of worsening cellulitis.  As of 11/16 cellulitis looks improved.  Assessment & Plan:   Active Problems:   Hypertensive cardiovascular disease   Biventricular ICD (implantable cardioverter-defibrillator) in place   Cardiomyopathy, ischemic   Paroxysmal atrial fibrillation (HCC)   CKD (chronic kidney disease) stage 3, GFR 30-59 ml/min (HCC)   PVD (peripheral vascular disease) with claudication (HCC)   Diabetic foot infection (HCC)  Hematemesis and Hematochezia - Pt developed heme positive stools and hemetemesis on 11/19 with hypotension - INR up to 4.5.  He was given vit K, 3 units FFP - He was emergently transfused 3 units PRBC and transferred to ICU - GI consulted.  - Hypotension improved after PRBC transfusion.  Acute blood loss Anemia - transfusing 3 units PRBCs.  Holding plavix, Holding warfarin, Repeat Hg later today.    Hypotension - resolved now.  Will follow closely in ICU.    Hyperkalemia  - recheck BMP, possible hemolysis.  Lasix 20 mg IV given to treat.   Leukocytosis - suspect reactive from acute GI bleeding with hematemesis.    Left foot cellulitis -Pt stepped on a nail about 2 weeks ago -CT scan without evidence for osteomyelitis or abscess formation.  No indication for surgical consultation. -Good pedal pulses. -broad-spectrum antibiotic therapy with vancomycin and Zosyn -Encouraged elevation of extremity when in  bed -General surgery consult to evaluate blister on bottom of left foot, may need I&D  Acute on chronic kidney disease stage III -Continue to hold nephrotoxins, baseline creatinine is around 1.5-1.7, is 1.82 down from 2.14 on 11/14. -gentle IVFs  Insulin-dependent diabetes -Holding 70/30 while NPO, place on sliding scale insulin (renal), check CBG Q4h  Paroxysmal atrial fibrillation -Rate controlled, continue Coreg.  Holding warfarin.  Rheumatoid arthritis -Stable, sulfasalazine will be held while working up for GI bleed.  Gout -Stable, continue allopurinol.  Essential hypertension -Well-controlled, continue home medications   DVT prophylaxis: Warfarin Code Status: Full code Family Communication: Discussed with wife via phone on 11/15, 11/17, 11/19 Disposition Plan: ambulate with PT    Consultants:   GI  General surgery  Procedures:   None  Antimicrobials:  Anti-infectives (From admission, onward)   Start     Dose/Rate Route Frequency Ordered Stop   10/03/17 2100  vancomycin (VANCOCIN) IVPB 1000 mg/200 mL premix     1,000 mg 200 mL/hr over 60 Minutes Intravenous Every 24 hours 10/02/17 2058     09/29/17 2000  vancomycin (VANCOCIN) 1,500 mg in sodium chloride 0.9 % 500 mL IVPB  Status:  Discontinued     1,500 mg 250 mL/hr over 120 Minutes Intravenous Every 48 hours 09/27/17 1808 09/28/17 1100   09/29/17 2000  vancomycin (VANCOCIN) 1,250 mg in sodium chloride 0.9 % 250 mL IVPB  Status:  Discontinued     1,250 mg 166.7 mL/hr over 90 Minutes Intravenous Every 24 hours 09/29/17 1959 10/02/17 2058   09/29/17  2000  piperacillin-tazobactam (ZOSYN) IVPB 3.375 g     3.375 g 12.5 mL/hr over 240 Minutes Intravenous Every 8 hours 09/29/17 1959     09/28/17 2200  doxycycline (VIBRA-TABS) tablet 100 mg  Status:  Discontinued     100 mg Oral Every 12 hours 09/28/17 1904 09/29/17 1751   09/28/17 2000  vancomycin (VANCOCIN) 1,250 mg in sodium chloride 0.9 % 250 mL IVPB  Status:   Discontinued     1,250 mg 166.7 mL/hr over 90 Minutes Intravenous Every 24 hours 09/28/17 1100 09/28/17 1904   09/27/17 2100  ceFEPIme (MAXIPIME) 2 g in dextrose 5 % 50 mL IVPB  Status:  Discontinued     2 g 100 mL/hr over 30 Minutes Intravenous Every 24 hours 09/27/17 1808 09/28/17 1904   09/27/17 2000  vancomycin (VANCOCIN) IVPB 1000 mg/200 mL premix     1,000 mg 200 mL/hr over 60 Minutes Intravenous  Once 09/27/17 1808 09/27/17 2100   09/27/17 1245  vancomycin (VANCOCIN) IVPB 1000 mg/200 mL premix     1,000 mg 200 mL/hr over 60 Minutes Intravenous  Once 09/27/17 1238 09/27/17 1440   09/27/17 1245  piperacillin-tazobactam (ZOSYN) IVPB 3.375 g     3.375 g 100 mL/hr over 30 Minutes Intravenous  Once 09/27/17 1238 09/27/17 1405     Subjective: Pt developed hematemesis early this morning and hematochezia.  Transferred to ICU.   Objective: Vitals:   10/04/17 0545 10/04/17 0605 10/04/17 0610 10/04/17 0615  BP: (!) 75/54 (!) 133/53 126/85 133/78  Pulse: 81 89 85 83  Resp: (!) 22 17 13 18   Temp:  99 F (37.2 C)    TempSrc:  Oral    SpO2: 97% 100% 100% 100%  Weight:    119.5 kg (263 lb 7.2 oz)  Height:    5\' 8"  (1.727 m)    Intake/Output Summary (Last 24 hours) at 10/04/2017 0636 Last data filed at 10/04/2017 0300 Gross per 24 hour  Intake 2584.58 ml  Output 1152 ml  Net 1432.58 ml   Filed Weights   09/27/17 1807 10/03/17 0515 10/04/17 0615  Weight: 112.9 kg (248 lb 14.4 oz) 83.3 kg (183 lb 10.3 oz) 119.5 kg (263 lb 7.2 oz)   Examination:  General exam: Alert, awake, oriented x 3 Respiratory system: Clear to auscultation. Respiratory effort normal. Cardiovascular system:RRR. No murmurs, rubs, gallops. Gastrointestinal system: Abdomen is obese,nondistended, soft and nontender. No organomegaly or masses felt. Normal bowel sounds heard. Central nervous system: Alert and oriented. No focal neurological deficits. Extremities: Positive pedal pulses: large blood filled blister  forming on bottom of left foot (ball of foot)  Psychiatry: Judgement and insight appear normal. Mood & affect appropriate.   Data Reviewed: I have personally reviewed following labs and imaging studies  CBC: Recent Labs  Lab 09/27/17 1147 09/28/17 0506 09/29/17 0440 09/30/17 0501 10/03/17 0623 10/04/17 0451  WBC 11.1* 9.1 11.6* 11.1* 12.3* 19.2*  NEUTROABS 9.0*  --   --   --   --   --   HGB 12.3* 13.2 13.3 11.8* 9.7* 7.0*  HCT 38.0* 40.1 40.0 36.0* 31.1* 22.3*  MCV 98.4 99.3 98.0 98.9 100.6* 100.9*  PLT 209 225 265 240 271 350   Basic Metabolic Panel: Recent Labs  Lab 09/29/17 0440 09/30/17 0501 10/01/17 0509 10/03/17 0623 10/04/17 0451  NA 129* 132* 130* 132* 134*  K 5.1 5.2* 5.0 4.8 5.5*  CL 96* 98* 101 105 109  CO2 21* 23 22 20* 20*  GLUCOSE 249*  85 204* 141* 222*  BUN 44* 49* 46* 30* 33*  CREATININE 1.97* 2.14* 2.03* 1.82* 2.08*  CALCIUM 9.1 8.9 8.4* 8.3* 7.9*  MG 2.1  --   --   --   --    GFR: Estimated Creatinine Clearance: 37.4 mL/min (A) (by C-G formula based on SCr of 2.08 mg/dL (H)). Liver Function Tests: No results for input(s): AST, ALT, ALKPHOS, BILITOT, PROT, ALBUMIN in the last 168 hours. No results for input(s): LIPASE, AMYLASE in the last 168 hours. No results for input(s): AMMONIA in the last 168 hours. Coagulation Profile: Recent Labs  Lab 09/30/17 0501 10/01/17 0509 10/02/17 0624 10/03/17 0623 10/04/17 0451  INR 1.83 2.55 3.22 3.25 4.52*   Cardiac Enzymes: No results for input(s): CKTOTAL, CKMB, CKMBINDEX, TROPONINI in the last 168 hours. BNP (last 3 results) No results for input(s): PROBNP in the last 8760 hours. HbA1C: No results for input(s): HGBA1C in the last 72 hours. CBG: Recent Labs  Lab 10/02/17 2132 10/03/17 0735 10/03/17 1153 10/03/17 1609 10/03/17 2101  GLUCAP 127* 134* 135* 198* 188*   Lipid Profile: No results for input(s): CHOL, HDL, LDLCALC, TRIG, CHOLHDL, LDLDIRECT in the last 72 hours. Thyroid Function  Tests: No results for input(s): TSH, T4TOTAL, FREET4, T3FREE, THYROIDAB in the last 72 hours. Anemia Panel: No results for input(s): VITAMINB12, FOLATE, FERRITIN, TIBC, IRON, RETICCTPCT in the last 72 hours. Urine analysis:    Component Value Date/Time   COLORURINE YELLOW 07/05/2014 2117   APPEARANCEUR CLEAR 07/05/2014 2117   LABSPEC 1.017 07/05/2014 2117   PHURINE 5.0 07/05/2014 2117   GLUCOSEU NEGATIVE 07/05/2014 2117   HGBUR NEGATIVE 07/05/2014 2117   BILIRUBINUR NEGATIVE 07/05/2014 2117   KETONESUR NEGATIVE 07/05/2014 2117   PROTEINUR 30 (A) 07/05/2014 2117   UROBILINOGEN 0.2 07/05/2014 2117   NITRITE NEGATIVE 07/05/2014 2117   LEUKOCYTESUR SMALL (A) 07/05/2014 2117    Recent Results (from the past 240 hour(s))  Culture, blood (Routine X 2) w Reflex to ID Panel     Status: None   Collection Time: 09/27/17  1:09 PM  Result Value Ref Range Status   Specimen Description BLOOD RIGHT ARM  Final   Special Requests   Final    BOTTLES DRAWN AEROBIC AND ANAEROBIC Blood Culture adequate volume   Culture NO GROWTH 5 DAYS  Final   Report Status 10/02/2017 FINAL  Final  Culture, blood (Routine X 2) w Reflex to ID Panel     Status: None   Collection Time: 09/27/17  1:23 PM  Result Value Ref Range Status   Specimen Description BLOOD RIGHT HAND  Final   Special Requests   Final    BOTTLES DRAWN AEROBIC AND ANAEROBIC Blood Culture adequate volume   Culture NO GROWTH 5 DAYS  Final   Report Status 10/02/2017 FINAL  Final    Radiology Studies: Dg Foot 2 Views Left  Result Date: 10/03/2017 CLINICAL DATA:  Left foot pain, history of diabetic foot infection EXAM: LEFT FOOT - 2 VIEW COMPARISON:  09/28/2017 FINDINGS: Considerable soft tissue swelling is noted along the distal aspect of the foot slightly increased when compared with the prior exam. Again no radiopaque foreign body is seen. The osseous structures show some degenerative change similar to that seen on the prior exam. No acute  fracture or dislocation is noted. No bony erosion to suggest osteomyelitis is seen. IMPRESSION: Increasing soft tissue cellulitis in the distal foot when compared with the prior exam. Again no radiopaque foreign body or acute bony  abnormality is seen. Electronically Signed   By: Alcide Clever M.D.   On: 10/03/2017 14:11    Scheduled Meds: . allopurinol  100 mg Oral Daily  . carvedilol  12.5 mg Oral BID WC  . furosemide  20 mg Intravenous Once  . hydrALAZINE  25 mg Oral TID  . insulin aspart  0-9 Units Subcutaneous TID WC  . isosorbide mononitrate  30 mg Oral Daily  . levothyroxine  50 mcg Oral QAC breakfast  . loratadine  10 mg Oral Daily  . [START ON 10/07/2017] pantoprazole  40 mg Intravenous Q12H  . sulfaSALAzine  500 mg Oral BID  . tamsulosin  0.4 mg Oral QPC supper  . Warfarin - Pharmacist Dosing Inpatient   Does not apply Q24H   Continuous Infusions: . sodium chloride 75 mL/hr at 10/03/17 1709  . sodium chloride    . pantoprazole (PROTONIX) IVPB 80 mg (10/04/17 0617)  . pantoprozole (PROTONIX) infusion    . phytonadione (VITAMIN K) IV    . piperacillin-tazobactam (ZOSYN)  IV 3.375 g (10/04/17 0412)  . sodium chloride    . vancomycin Stopped (10/03/17 2125)    LOS: 7 days   Critical Care Time spent: 38 minutes.  Standley Dakins, MD [facility] Pager [phone]  If 7PM-7AM, please contact night-coverage [email or web address] Password [facility] 10/04/2017, 6:36 AM

## 2017-10-04 NOTE — Telephone Encounter (Signed)
Confirmed remote transmission w/ pt wife.   

## 2017-10-04 NOTE — Progress Notes (Addendum)
ANTICOAGULATION CONSULT NOTE  Pharmacy Consult for Warfarin Indication: atrial fibrillation  Allergies  Allergen Reactions  . Allegra [Fexofenadine]   . Biaxin [Clarithromycin]   . Crestor [Rosuvastatin Calcium]   . Glucophage [Metformin Hcl]   . Trimethoprim   . Codeine Other (See Comments)    blisters, but able to take hydrocodone   Patient Measurements: Height: 5\' 8"  (172.7 cm) Weight: 263 lb 7.2 oz (119.5 kg) IBW/kg (Calculated) : 68.4  Vital Signs: Temp: 97.7 F (36.5 C) (11/19 0942) Temp Source: Oral (11/19 0942) BP: 142/91 (11/19 0942) Pulse Rate: 79 (11/19 0942)  Labs: Recent Labs    10/02/17 0624 10/03/17 0623 10/04/17 0451  HGB  --  9.7* 7.0*  HCT  --  31.1* 22.3*  PLT  --  271 350  LABPROT 32.7* 32.9* 42.9*  INR 3.22 3.25 4.52*  CREATININE  --  1.82* 2.08*   Estimated Creatinine Clearance: 37.4 mL/min (A) (by C-G formula based on SCr of 2.08 mg/dL (H)).  Assessment: Okay for Protocol. INR remains supratherapeutic today.Patient had bright red bloody bowel movement, hematemesis, and hypotensive with systolic in the 70s this AM. He  received 5 mg Vit K, fluid bolus. Has not had coumadin since Friday 11/16  Goal of Therapy:  INR 2-3   Plan:  No coumadin today Daily PT/INR. Monitor for signs and symptoms of bleeding.   01-16-1989, BS Pharm D, BCPS Clinical Pharmacist Pager [phone]  10/04/2017,10:46 AM

## 2017-10-04 NOTE — Plan of Care (Signed)
Patient BP has stabilized. He is still receiving FFP at this time.

## 2017-10-05 ENCOUNTER — Inpatient Hospital Stay (HOSPITAL_COMMUNITY): Payer: PPO

## 2017-10-05 ENCOUNTER — Inpatient Hospital Stay (HOSPITAL_COMMUNITY): Payer: PPO | Admitting: Anesthesiology

## 2017-10-05 ENCOUNTER — Encounter (HOSPITAL_COMMUNITY)
Admission: EM | Disposition: A | Discharge status: Skilled Nursing Facility | Payer: Self-pay | Source: Home / Self Care | Attending: Pulmonary Disease

## 2017-10-05 ENCOUNTER — Encounter (HOSPITAL_COMMUNITY): Payer: Self-pay | Admitting: *Deleted

## 2017-10-05 ENCOUNTER — Other Ambulatory Visit: Payer: Self-pay | Admitting: *Deleted

## 2017-10-05 DIAGNOSIS — N183 Chronic kidney disease, stage 3 (moderate): Secondary | ICD-10-CM

## 2017-10-05 DIAGNOSIS — J9601 Acute respiratory failure with hypoxia: Secondary | ICD-10-CM

## 2017-10-05 DIAGNOSIS — M79672 Pain in left foot: Secondary | ICD-10-CM

## 2017-10-05 DIAGNOSIS — D62 Acute posthemorrhagic anemia: Secondary | ICD-10-CM

## 2017-10-05 DIAGNOSIS — L03116 Cellulitis of left lower limb: Secondary | ICD-10-CM

## 2017-10-05 HISTORY — PX: ESOPHAGOGASTRODUODENOSCOPY (EGD) WITH PROPOFOL: SHX5813

## 2017-10-05 LAB — BLOOD GAS, ARTERIAL
Acid-base deficit: 4 mmol/L — ABNORMAL HIGH (ref 0.0–2.0)
BICARBONATE: 21 mmol/L (ref 20.0–28.0)
Drawn by: 234301
FIO2: 100
LHR: 16 {breaths}/min
O2 SAT: 99.4 %
PEEP: 5 cmH2O
Patient temperature: 37
VT: 550 mL
pCO2 arterial: 40.7 mmHg (ref 32.0–48.0)
pH, Arterial: 7.332 — ABNORMAL LOW (ref 7.350–7.450)
pO2, Arterial: 299 mmHg — ABNORMAL HIGH (ref 83.0–108.0)

## 2017-10-05 LAB — PREPARE FRESH FROZEN PLASMA
UNIT DIVISION: 0
UNIT DIVISION: 0
Unit division: 0

## 2017-10-05 LAB — CBC
HEMATOCRIT: 23.2 % — AB (ref 39.0–52.0)
HEMOGLOBIN: 7.4 g/dL — AB (ref 13.0–17.0)
MCH: 31 pg (ref 26.0–34.0)
MCHC: 31.9 g/dL (ref 30.0–36.0)
MCV: 97.1 fL (ref 78.0–100.0)
Platelets: 291 10*3/uL (ref 150–400)
RBC: 2.39 MIL/uL — ABNORMAL LOW (ref 4.22–5.81)
RDW: 16.7 % — ABNORMAL HIGH (ref 11.5–15.5)
WBC: 18.4 10*3/uL — ABNORMAL HIGH (ref 4.0–10.5)

## 2017-10-05 LAB — BASIC METABOLIC PANEL
ANION GAP: 6 (ref 5–15)
BUN: 42 mg/dL — AB (ref 6–20)
CALCIUM: 8.4 mg/dL — AB (ref 8.9–10.3)
CO2: 22 mmol/L (ref 22–32)
Chloride: 111 mmol/L (ref 101–111)
Creatinine, Ser: 2 mg/dL — ABNORMAL HIGH (ref 0.61–1.24)
GFR calc Af Amer: 35 mL/min — ABNORMAL LOW (ref >60)
GFR calc non Af Amer: 30 mL/min — ABNORMAL LOW (ref >60)
GLUCOSE: 250 mg/dL — AB (ref 65–99)
POTASSIUM: 4.9 mmol/L (ref 3.5–5.1)
Sodium: 139 mmol/L (ref 135–145)

## 2017-10-05 LAB — COMPREHENSIVE METABOLIC PANEL
ALT: 24 U/L (ref 17–63)
AST: 49 U/L — ABNORMAL HIGH (ref 15–41)
Albumin: 2 g/dL — ABNORMAL LOW (ref 3.5–5.0)
Alkaline Phosphatase: 78 U/L (ref 38–126)
Anion gap: 4 — ABNORMAL LOW (ref 5–15)
BUN: 44 mg/dL — ABNORMAL HIGH (ref 6–20)
CO2: 22 mmol/L (ref 22–32)
Calcium: 8.5 mg/dL — ABNORMAL LOW (ref 8.9–10.3)
Chloride: 115 mmol/L — ABNORMAL HIGH (ref 101–111)
Creatinine, Ser: 1.99 mg/dL — ABNORMAL HIGH (ref 0.61–1.24)
GFR calc Af Amer: 36 mL/min — ABNORMAL LOW (ref >60)
GFR calc non Af Amer: 31 mL/min — ABNORMAL LOW (ref >60)
Glucose, Bld: 205 mg/dL — ABNORMAL HIGH (ref 65–99)
Potassium: 4.4 mmol/L (ref 3.5–5.1)
Sodium: 141 mmol/L (ref 135–145)
Total Bilirubin: 0.7 mg/dL (ref 0.3–1.2)
Total Protein: 5.3 g/dL — ABNORMAL LOW (ref 6.5–8.1)

## 2017-10-05 LAB — CBC WITH DIFFERENTIAL/PLATELET
Basophils Absolute: 0 10*3/uL (ref 0.0–0.1)
Basophils Relative: 0 %
Eosinophils Absolute: 0.1 10*3/uL (ref 0.0–0.7)
Eosinophils Relative: 1 %
HCT: 21.5 % — ABNORMAL LOW (ref 39.0–52.0)
Hemoglobin: 7.3 g/dL — ABNORMAL LOW (ref 13.0–17.0)
Lymphocytes Relative: 7 %
Lymphs Abs: 0.8 10*3/uL (ref 0.7–4.0)
MCH: 32 pg (ref 26.0–34.0)
MCHC: 34 g/dL (ref 30.0–36.0)
MCV: 94.3 fL (ref 78.0–100.0)
Monocytes Absolute: 0.6 10*3/uL (ref 0.1–1.0)
Monocytes Relative: 5 %
Neutro Abs: 10.1 10*3/uL — ABNORMAL HIGH (ref 1.7–7.7)
Neutrophils Relative %: 87 %
Platelets: 226 10*3/uL (ref 150–400)
RBC: 2.28 MIL/uL — ABNORMAL LOW (ref 4.22–5.81)
RDW: 16.9 % — ABNORMAL HIGH (ref 11.5–15.5)
WBC: 11.6 10*3/uL — ABNORMAL HIGH (ref 4.0–10.5)

## 2017-10-05 LAB — GLUCOSE, CAPILLARY
GLUCOSE-CAPILLARY: 215 mg/dL — AB (ref 65–99)
GLUCOSE-CAPILLARY: 171 mg/dL — AB (ref 65–99)
Glucose-Capillary: 175 mg/dL — ABNORMAL HIGH (ref 65–99)
Glucose-Capillary: 185 mg/dL — ABNORMAL HIGH (ref 65–99)
Glucose-Capillary: 227 mg/dL — ABNORMAL HIGH (ref 65–99)
Glucose-Capillary: 241 mg/dL — ABNORMAL HIGH (ref 65–99)
Glucose-Capillary: 241 mg/dL — ABNORMAL HIGH (ref 65–99)

## 2017-10-05 LAB — ABO/RH: ABO/RH(D): A POS

## 2017-10-05 LAB — BPAM FFP
BLOOD PRODUCT EXPIRATION DATE: 201811242359
Blood Product Expiration Date: 201811242359
Blood Product Expiration Date: 201811242359
ISSUE DATE / TIME: 201811191605
ISSUE DATE / TIME: 201811190914
ISSUE DATE / TIME: 201811191151
UNIT TYPE AND RH: 6200
UNIT TYPE AND RH: 6200
Unit Type and Rh: 6200

## 2017-10-05 LAB — MAGNESIUM: Magnesium: 2.2 mg/dL (ref 1.7–2.4)

## 2017-10-05 LAB — PHOSPHORUS: Phosphorus: 2.8 mg/dL (ref 2.5–4.6)

## 2017-10-05 LAB — PROTIME-INR
INR: 1.35
INR: 1.38
PROTHROMBIN TIME: 16.5 s — AB (ref 11.4–15.2)
Prothrombin Time: 16.9 seconds — ABNORMAL HIGH (ref 11.4–15.2)

## 2017-10-05 LAB — TRIGLYCERIDES: Triglycerides: 189 mg/dL — ABNORMAL HIGH (ref <150)

## 2017-10-05 LAB — PREPARE RBC (CROSSMATCH)

## 2017-10-05 LAB — APTT: aPTT: 44 seconds — ABNORMAL HIGH (ref 24–36)

## 2017-10-05 SURGERY — ESOPHAGOGASTRODUODENOSCOPY (EGD) WITH PROPOFOL
Anesthesia: Monitor Anesthesia Care

## 2017-10-05 MED ORDER — NOREPINEPHRINE BITARTRATE 1 MG/ML IV SOLN
0.0000 ug/min | INTRAVENOUS | Status: DC
  Filled 2017-10-05: qty 4

## 2017-10-05 MED ORDER — ORAL CARE MOUTH RINSE
15.0000 mL | Freq: Four times a day (QID) | OROMUCOSAL | Status: DC
  Administered 2017-10-05 – 2017-10-11 (×22): 15 mL via OROMUCOSAL

## 2017-10-05 MED ORDER — LACTATED RINGERS IV SOLN
INTRAVENOUS | Status: DC | PRN
  Administered 2017-10-05: 13:00:00 via INTRAVENOUS

## 2017-10-05 MED ORDER — INSULIN GLARGINE 100 UNIT/ML ~~LOC~~ SOLN
30.0000 [IU] | Freq: Every day | SUBCUTANEOUS | Status: DC
  Administered 2017-10-05 – 2017-10-06 (×2): 30 [IU] via SUBCUTANEOUS
  Filled 2017-10-05 (×4): qty 0.3

## 2017-10-05 MED ORDER — LIDOCAINE VISCOUS 2 % MT SOLN
15.0000 mL | Freq: Once | OROMUCOSAL | Status: AC
  Administered 2017-10-05: 15 mL via OROMUCOSAL

## 2017-10-05 MED ORDER — GLUCERNA SHAKE PO LIQD: 237.0000 mL | Freq: Two times a day (BID) | ORAL | Status: DC

## 2017-10-05 MED ORDER — PROPOFOL 1000 MG/100ML IV EMUL
0.0000 ug/kg/min | INTRAVENOUS | Status: DC
  Administered 2017-10-05: 4.967 ug/kg/min via INTRAVENOUS
  Administered 2017-10-05: 25 ug/kg/min via INTRAVENOUS
  Administered 2017-10-05: 5 ug/kg/min via INTRAVENOUS
  Administered 2017-10-06 – 2017-10-07 (×8): 25 ug/kg/min via INTRAVENOUS
  Administered 2017-10-07 – 2017-10-08 (×2): 35 ug/kg/min via INTRAVENOUS
  Administered 2017-10-08: 25 ug/kg/min via INTRAVENOUS
  Administered 2017-10-08: 45 ug/kg/min via INTRAVENOUS
  Administered 2017-10-08 (×3): 35 ug/kg/min via INTRAVENOUS
  Administered 2017-10-08: 45 ug/kg/min via INTRAVENOUS
  Administered 2017-10-09: 35 ug/kg/min via INTRAVENOUS
  Administered 2017-10-09: 45 ug/kg/min via INTRAVENOUS
  Administered 2017-10-09 (×2): 35 ug/kg/min via INTRAVENOUS
  Administered 2017-10-09: 25 ug/kg/min via INTRAVENOUS
  Administered 2017-10-09: 50 ug/kg/min via INTRAVENOUS
  Administered 2017-10-09: 35 ug/kg/min via INTRAVENOUS
  Administered 2017-10-10 (×2): 45 ug/kg/min via INTRAVENOUS
  Administered 2017-10-10: 35 ug/kg/min via INTRAVENOUS
  Filled 2017-10-05 (×26): qty 100

## 2017-10-05 MED ORDER — GLYCOPYRROLATE 0.2 MG/ML IJ SOLN
0.2000 mg | Freq: Once | INTRAMUSCULAR | Status: AC
  Administered 2017-10-05: 0.2 mg via INTRAVENOUS

## 2017-10-05 MED ORDER — FENTANYL CITRATE (PF) 100 MCG/2ML IJ SOLN
50.0000 ug | INTRAMUSCULAR | Status: DC | PRN
  Administered 2017-10-05 – 2017-10-14 (×11): 50 ug via INTRAVENOUS
  Filled 2017-10-05 (×12): qty 2

## 2017-10-05 MED ORDER — CHLORHEXIDINE GLUCONATE CLOTH 2 % EX PADS
6.0000 | MEDICATED_PAD | Freq: Every day | CUTANEOUS | Status: DC
  Administered 2017-10-06: 6 via TOPICAL

## 2017-10-05 MED ORDER — FENTANYL CITRATE (PF) 100 MCG/2ML IJ SOLN
INTRAMUSCULAR | Status: AC
  Administered 2017-10-05: 50 ug via INTRAVENOUS
  Filled 2017-10-05: qty 2

## 2017-10-05 MED ORDER — PROPOFOL 1000 MG/100ML IV EMUL
INTRAVENOUS | Status: AC
  Administered 2017-10-05: 5 ug/kg/min via INTRAVENOUS
  Filled 2017-10-05: qty 100

## 2017-10-05 MED ORDER — SODIUM CHLORIDE 0.9 % IV SOLN: INTRAVENOUS | Status: DC

## 2017-10-05 MED ORDER — PRO-STAT SUGAR FREE PO LIQD: 30.0000 mL | Freq: Two times a day (BID) | ORAL | Status: DC

## 2017-10-05 MED ORDER — PROPOFOL 500 MG/50ML IV EMUL
INTRAVENOUS | Status: DC | PRN
  Administered 2017-10-05: 100 ug/kg/min via INTRAVENOUS

## 2017-10-05 MED ORDER — SODIUM CHLORIDE 0.9% FLUSH
10.0000 mL | Freq: Two times a day (BID) | INTRAVENOUS | Status: DC
  Administered 2017-10-05 – 2017-10-13 (×9): 10 mL
  Administered 2017-10-14: 20 mL
  Administered 2017-10-14: 10 mL

## 2017-10-05 MED ORDER — LACTATED RINGERS IV SOLN
INTRAVENOUS | Status: DC
  Administered 2017-10-05: 12:00:00 via INTRAVENOUS

## 2017-10-05 MED ORDER — CHLORHEXIDINE GLUCONATE 0.12% ORAL RINSE (MEDLINE KIT)
15.0000 mL | Freq: Two times a day (BID) | OROMUCOSAL | Status: DC
  Administered 2017-10-05 – 2017-10-10 (×10): 15 mL via OROMUCOSAL

## 2017-10-05 MED ORDER — INSULIN ASPART 100 UNIT/ML ~~LOC~~ SOLN
0.0000 [IU] | SUBCUTANEOUS | Status: DC
  Administered 2017-10-05 (×2): 2 [IU] via SUBCUTANEOUS
  Administered 2017-10-06: 1 [IU] via SUBCUTANEOUS
  Administered 2017-10-06: 2 [IU] via SUBCUTANEOUS
  Administered 2017-10-06: 1 [IU] via SUBCUTANEOUS
  Administered 2017-10-06: 2 [IU] via SUBCUTANEOUS
  Administered 2017-10-07 – 2017-10-09 (×8): 1 [IU] via SUBCUTANEOUS
  Administered 2017-10-10: 2 [IU] via SUBCUTANEOUS
  Administered 2017-10-10 (×2): 1 [IU] via SUBCUTANEOUS
  Administered 2017-10-10 – 2017-10-13 (×17): 2 [IU] via SUBCUTANEOUS
  Administered 2017-10-14: 3 [IU] via SUBCUTANEOUS
  Administered 2017-10-14 (×3): 2 [IU] via SUBCUTANEOUS
  Administered 2017-10-14: 1 [IU] via SUBCUTANEOUS
  Administered 2017-10-15 (×4): 2 [IU] via SUBCUTANEOUS

## 2017-10-05 MED ORDER — SODIUM CHLORIDE 0.9 % IV SOLN
Freq: Once | INTRAVENOUS | Status: AC
  Administered 2017-10-05: 21:00:00 via INTRAVENOUS

## 2017-10-05 MED ORDER — PROPOFOL 10 MG/ML IV BOLUS
INTRAVENOUS | Status: DC | PRN
  Administered 2017-10-05 (×4): 20 mg via INTRAVENOUS

## 2017-10-05 MED ORDER — LIDOCAINE VISCOUS 2 % MT SOLN
OROMUCOSAL | Status: AC
  Filled 2017-10-05: qty 15

## 2017-10-05 MED ORDER — FENTANYL CITRATE (PF) 100 MCG/2ML IJ SOLN
50.0000 ug | INTRAMUSCULAR | Status: AC | PRN
  Administered 2017-10-05 (×3): 50 ug via INTRAVENOUS
  Filled 2017-10-05: qty 2

## 2017-10-05 MED ORDER — PANTOPRAZOLE SODIUM 40 MG IV SOLR
INTRAVENOUS | Status: AC
  Filled 2017-10-05: qty 80

## 2017-10-05 MED ORDER — INSULIN ASPART 100 UNIT/ML ~~LOC~~ SOLN
8.0000 [IU] | Freq: Three times a day (TID) | SUBCUTANEOUS | Status: DC
  Administered 2017-10-05: 8 [IU] via SUBCUTANEOUS

## 2017-10-05 MED ORDER — SODIUM CHLORIDE 0.9% FLUSH
10.0000 mL | INTRAVENOUS | Status: DC | PRN
  Administered 2017-10-13 – 2017-10-14 (×3): 10 mL
  Filled 2017-10-05 (×3): qty 40

## 2017-10-05 MED ORDER — GLYCOPYRROLATE 0.2 MG/ML IJ SOLN
INTRAMUSCULAR | Status: AC
  Filled 2017-10-05: qty 1

## 2017-10-05 MED ORDER — PROPOFOL 10 MG/ML IV BOLUS
INTRAVENOUS | Status: AC
  Filled 2017-10-05: qty 20

## 2017-10-05 NOTE — Progress Notes (Signed)
Upon assessing wound on left posterior foot, previous kerlex dressing was adhered to the wound. Utilized saline to remove old dressing. Then cleansed left foot with soap and water per order. For the new dressing not to adhere to left foot, placed one damp gauze on wound then wrapped with kerlex. Will pass on to night shift RN.

## 2017-10-05 NOTE — Progress Notes (Addendum)
PT SEDATION INITIATED. BECAME APENIC AND BLUE. BVM INITIATED. PT MAINTAINED PULSE AND BP. DR. Jayme Cloud IN TO INTUBATE PT. SPOKE WITH DR. Laural Benes. PT NEEDS TRANSFER TO [place] FOR ADDITIONAL MANAGEMENT AND TO COMPLETE EGD. CALLED ICU TO SET UP VENTILATOR. WIFE AWARE CHANGE IN STATUS AND RESPIRATORY COMPLICATION OF ANESTHESIA. DISCUSSED TRANSFER WITH [facility].
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
## 2017-10-05 NOTE — Anesthesia Procedure Notes (Signed)
Procedure Name: Intubation Date/Time: 10/05/2017 1:30 PM Performed by: Ollen Bowl, CRNA Pre-anesthesia Checklist: Patient identified, Patient being monitored, Timeout performed, Emergency Drugs available and Suction available Patient Re-evaluated:Patient Re-evaluated prior to induction Oxygen Delivery Method: Circle system utilized Preoxygenation: Pre-oxygenation with 100% oxygen Induction Type: IV induction Ventilation: Mask ventilation without difficulty Laryngoscope Size: Mac and 3 Grade View: Grade I Tube type: Subglottic suction tube Tube size: 8.0 mm Number of attempts: 1 Airway Equipment and Method: Stylet Placement Confirmation: ETT inserted through vocal cords under direct vision,  positive ETCO2 and breath sounds checked- equal and bilateral Secured at: 21 cm Tube secured with: Tape Dental Injury: Teeth and Oropharynx as per pre-operative assessment
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
## 2017-10-05 NOTE — H&P (Addendum)
Primary Care Physician:  [name], MD Primary Gastroenterologist:  Dr. Darrick Penna  Pre-Procedure History & Physical: HPI:  [name] is a 76 y.o. male here for MELENA. ADMITTED WITH HEMATOCHEZIA AND HEMATEMESIS ON COUMADIN(INR > 4) AND PLAVIX. HB DOWN FROM 13-->7. WIFE REPORTS LARGE AMOUNT x2 YESTERDAY & ONE SML AMOUNT TODAY. 4u pRBCs SINCE ADMISSION.  Past Medical History:  Diagnosis Date  . AICD (automatic cardioverter/defibrillator) present   . Anemia   . Anemia-chronic    a. Pt reports h/o anemia - was offered IV iron in past by nephrologist but declined and has taken PO instead.  . Aneurysm, cerebral   . Arthritis    bilateral wrist and fingers  . BPH (benign prostatic hypertrophy)   . Cardiomyopathy, ischemic    LVEF 25-35%.  . Chronic kidney disease, stage III (moderate) (HCC)    Followed by Dr. Fausto Skillern.  . Coronary atherosclerosis of native coronary artery    a. DES to RCA 03/2009. b. s/p PTCA to RCA for ISR, 05/2010. c. 03/2013 NSTEMI 2/2 severe prox RCA s/p PTCA/DES, med rx for residual dz.  . Essential hypertension, benign   . Hyperlipidemia   . Hypothyroidism   . LBBB (left bundle branch block)    s/p BiV ICD Implanted by Dr Graciela Husbands  . Morbid obesity (HCC)   . MRSA (methicillin resistant Staphylococcus aureus)   . Myocardial infarction (HCC)   . Paroxysmal atrial fibrillation (HCC)    a. Coumadin discontinue in 2010 when the patient required ASA/Plavix for stenting. b. Coumadin restarted 05/2013, Plavix continued, ASA stopped.   . Pneumonia    years ago 17  . Pulmonary nodule    CXR, 03/2013, [facility] - not described on any subsequent chest x-rays however, could be a vascular structure  . TIA (transient ischemic attack)    Multiple TIAs  . Type 2 diabetes mellitus (HCC)     Past Surgical History:  Procedure Laterality Date  . BIV ICD GENERATOR CHANGEOUT N/A 03/18/2017   [employer]tronic [employer] MRI conditional CRT D device implated by Dr Johney Frame  . CHOLECYSTECTOMY    . ICD  placement  05/06/2011   Medtronic CRT-D  . LEFT HEART CATHETERIZATION WITH CORONARY ANGIOGRAM N/A 04/17/2013   Procedure: LEFT HEART CATHETERIZATION WITH CORONARY ANGIOGRAM;  Surgeon: Vesta Mixer, MD;  Location: [facility] CATH LAB;  Service: Cardiovascular;  Laterality: N/A;  . LUNG SURGERY     24 - 24 yrs. old  . TOTAL KNEE ARTHROPLASTY    . VASECTOMY      Prior to Admission medications   Medication Sig Start Date End Date Taking? Authorizing Provider  allopurinol (ZYLOPRIM) 100 MG tablet Take 100 mg by mouth daily.   Yes [provider]  carvedilol (COREG) 12.5 MG tablet TAKE 1 AND 1/2 TABLETS BY MOUTH TWICE DAILY WITH MEALS (DUE FOR FOLLOW UP) 06/30/17  Yes Jonelle Sidle, MD  ciprofloxacin (CIPRO) 500 MG tablet Take 1 tablet (500 mg total) every 12 (twelve) hours by mouth. 09/24/17  Yes Eber Hong, MD  clindamycin (CLEOCIN) 150 MG capsule Take 2 capsules (300 mg total) 3 (three) times daily by mouth. May dispense as 150mg  capsules 09/24/17  Yes 13/9/18, MD  clopidogrel (PLAVIX) 75 MG tablet Take 1 tablet (75 mg total) by mouth daily. 03/25/17  Yes Allred, 05/25/17, MD  hydrALAZINE (APRESOLINE) 25 MG tablet TAKE ONE TABLET BY MOUTH THREE TIMES DAILY 07/20/17  Yes 09/19/17, MD  HYDROcodone-acetaminophen (NORCO) 7.5-325 MG tablet Take 1 tablet by  mouth 4 (four) times daily as needed for moderate pain.   Yes [provider]  insulin lispro protamine-insulin lispro (HUMALOG 75/25) (75-25) 100 UNIT/ML SUSP Inject 22-35 Units into the skin See admin instructions. Takes 35 units in the morning with breakfast and 35 units at supper (Will take 22-25 at lunch time occasionally)   Yes [provider]  isosorbide mononitrate (IMDUR) 30 MG 24 hr tablet TAKE ONE TABLET BY MOUTH ONCE DAILY 05/28/17  Yes Jonelle Sidle, MD  levothyroxine (SYNTHROID, LEVOTHROID) 50 MCG tablet Take 50 mcg by mouth daily. 06/29/17  Yes [provider]  lisinopril  (PRINIVIL,ZESTRIL) 2.5 MG tablet Take 2.5 mg by mouth daily.   Yes [provider]  loratadine (CLARITIN) 10 MG tablet Take 10 mg by mouth daily.   Yes [provider]  nitroGLYCERIN (NITROSTAT) 0.4 MG SL tablet Place 0.4 mg under the tongue every 5 (five) minutes as needed for chest pain. For chest pain 04/26/13  Yes Serpe, Clide Deutscher, PA-C  sulfaSALAzine (AZULFIDINE EN-TABS) 500 MG EC tablet Take 1 tablet (500 mg total) by mouth 2 (two) times daily. 03/30/17  Yes Deveshwar, Janalyn Rouse, MD  tamsulosin (FLOMAX) 0.4 MG CAPS capsule Take 0.4 mg by mouth.   Yes [provider]  torsemide (DEMADEX) 10 MG tablet Take 1 tablet (10 mg total) by mouth 2 (two) times daily. 12/30/15  Yes Jonelle Sidle, MD  warfarin (COUMADIN) 5 MG tablet Take 0.5-1 tablets (2.5-5 mg total) by mouth daily at 6 PM. Takes 2.5mg  on Sunday, Monday, Wednesday and Friday  Takes 5mg  on Tuesday, Thursday and Sunday 03/19/17  Yes Allred, Fayrene Fearing, MD  Zinc 50 MG CAPS Take 50 mg by mouth daily.    Yes [provider]  doxycycline (VIBRA-TABS) 100 MG tablet Take 1 tablet (100 mg total) by mouth 2 (two) times daily. Patient not taking: Reported on 09/27/2017 08/04/17   Helane Gunther, DPM    Allergies as of 09/27/2017 - Review Complete 09/27/2017  Allergen Reaction Noted  . Allegra [fexofenadine]  08/03/2017  . Biaxin [clarithromycin]  08/03/2017  . Crestor [rosuvastatin calcium]  08/03/2017  . Glucophage [metformin hcl]  08/03/2017  . Trimethoprim  08/03/2017  . Codeine Other (See Comments)     Family History  Problem Relation Age of Onset  . Diabetes Mother        Bilateral leg amputation  . Heart disease Mother        Before age 37  . Hypertension Mother   . Liver disease Mother        fatty liver   . Cancer Father        Prostate and Bone  . Diabetes Father   . Cancer Brother        Prostate  . Heart disease Other        family h/o premature cardiovascular disease  . Cancer Sister         Colon   . Hypertension Sister     Social History   Socioeconomic History  . Marital status: Married    Spouse name: Not on file  . Number of children: Not on file  . Years of education: Not on file  . Highest education level: Not on file  Social Needs  . Financial resource strain: Not on file  . Food insecurity - worry: Not on file  . Food insecurity - inability: Not on file  . Transportation needs - medical: Not on file  . Transportation needs - non-medical:  Not on file  Occupational History  . Not on file  Tobacco Use  . Smoking status: Former Smoker    Packs/day: 1.00    Years: 6.00    Pack years: 6.00    Types: Cigarettes    Start date: 12/08/1949    Last attempt to quit: 11/16/1964    Years since quitting: 52.9  . Smokeless tobacco: Former Neurosurgeon    Quit date: 04/15/1965  Substance and Sexual Activity  . Alcohol use: Yes    Alcohol/week: 0.0 oz    Comment: once/month  . Drug use: No  . Sexual activity: Not on file  Other Topics Concern  . Not on file  Social History Narrative  . Not on file    Review of Systems: See HPI, otherwise negative ROS   Physical Exam: BP (!) 145/75   Pulse 74   Temp 98.4 F (36.9 C) (Oral)   Resp 16   Ht 5\' 8"  (1.727 m)   Wt 266 lb 5.1 oz (120.8 kg)   SpO2 100%   BMI 40.49 kg/m  General:   Alert,  pleasant and cooperative in NAD Head:  Normocephalic and atraumatic. Neck:  Supple; Lungs:  Clear throughout to auscultation.    Heart:  Regular rate and rhythm. Abdomen:  Soft, nontender and nondistended. Normal bowel sounds, without guarding, and without rebound.   Neurologic:  Alert and  oriented x4;  grossly normal neurologically.  Impression/Plan:     UGIB  PLAN: 1. EGD TODAY DISCUSSED PROCEDURE, BENEFITS, & RISKS: < 1% chance of medication reaction, bleeding, OR perforation.

## 2017-10-05 NOTE — Anesthesia Preprocedure Evaluation (Signed)
Anesthesia Evaluation  Patient identified by MRN, date of birth, ID band Patient awake    Reviewed: Allergy & Precautions, NPO status , Patient's Chart, lab work & pertinent test results  Airway        Dental   Pulmonary neg pulmonary ROS, former smoker,    breath sounds clear to auscultation       Cardiovascular hypertension, Pt. on medications + CAD, + Past MI, + Peripheral Vascular Disease and +CHF  + dysrhythmias + Cardiac Defibrillator  Rhythm:Regular Rate:Normal     Neuro/Psych TIAnegative psych ROS   GI/Hepatic   Endo/Other  diabetes, Type 2Hypothyroidism   Renal/GU      Musculoskeletal   Abdominal   Peds  Hematology   Anesthesia Other Findings   Reproductive/Obstetrics                             Anesthesia Physical Anesthesia Plan  ASA: III  Anesthesia Plan: MAC   Post-op Pain Management:    Induction: Intravenous  PONV Risk Score and Plan:   Airway Management Planned: Simple Face Mask  Additional Equipment:   Intra-op Plan:   Post-operative Plan:   Informed Consent: I have reviewed the patients History and Physical, chart, labs and discussed the procedure including the risks, benefits and alternatives for the proposed anesthesia with the patient or authorized representative who has indicated his/her understanding and acceptance.     Plan Discussed with:   Anesthesia Plan Comments:         Anesthesia Quick Evaluation

## 2017-10-05 NOTE — Care Management Note (Signed)
Case Management Note  Patient Details  Name: [name] MRN: [id number] Date of Birth: Dec 06, 1940   Status of Service:  In process, will continue to follow  If discussed at Long Length of Stay Meetings, dates discussed:   10/05/2017 Additional Comments:  Windi Toro, Chrystine Oiler, RN 10/05/2017, 12:28 PM
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
## 2017-10-05 NOTE — Progress Notes (Signed)
Pharmacy Antibiotic Note  [name] is a 76 y.o. male admitted on 09/27/2017 with wound infection. He lost IV access and was switched to po doxycycline but cellulitis worsened and IV antibiotics vancomycin and zosyn were resumed.  Plan: Continue Vancomycin to 1000mg  IV every 24 hours.   Goal trough 10-15 mcg/mL.  Continue Zosyn 3.375 gm IV q8 hours Monitor labs, micro and vitals.   Height: 5\' 8"  (172.7 cm) Weight: 266 lb 5.1 oz (120.8 kg) IBW/kg (Calculated) : 68.4  Temp (24hrs), Avg:98.2 F (36.8 C), Min:97.4 F (36.3 C), Max:98.7 F (37.1 C)  Recent Labs  Lab 09/30/17 0501 10/01/17 0509 10/02/17 1949 10/03/17 0623 10/04/17 0451 10/04/17 1837 10/05/17 0624  WBC 11.1*  --   --  12.3* 19.2* 18.3* 18.4*  CREATININE 2.14* 2.03*  --  1.82* 2.08* 1.91* 2.00*  VANCOTROUGH  --   --  16  --   --   --   --     Obesity/Normalized CrCl Vancomycin dosing protocol will be initiated with an estimated normalized CrCl = 30 ml/min.   Estimated Creatinine Clearance: 39.1 mL/min (A) (by C-G formula based on SCr of 2 mg/dL (H)).    Allergies  Allergen Reactions  . Allegra [Fexofenadine]   . Biaxin [Clarithromycin]   . Crestor [Rosuvastatin Calcium]   . Glucophage [Metformin Hcl]   . Trimethoprim   . Codeine Other (See Comments)    blisters, but able to take hydrocodone   Antimicrobials this admission: Vanc 11/12 >> 11/13, 11/14>> Cefepime 11/12 >> 11/13 Zosyn x 1 11/12, 11/14>> Doxy 11/13 >>11/14  Dose adjustments this admission: Obesity/Normalized CrCl Vancomycin dosing protocol will be initiated with an estimated normalized CrCl = 30 ml/min.  11/17 Change vancomycin 1gm IV q24h due to slightly elevated trough.  Microbiology results: 11/12 BCx: (-) final  UCx:    Sputum:   11/19 MRSA PCR: (-)  Thank you for allowing pharmacy to be a part of this patient's care.  13/12, [name] 10/05/2017 12:25 PM

## 2017-10-05 NOTE — H&P (Signed)
PULMONARY / CRITICAL CARE MEDICINE   Name: [name] MRN: [id number] DOB: Oct 12, 1941    ADMISSION DATE:  09/27/2017   CHIEF COMPLAINT:  Left foot cellulitis and New GI Bleed   HISTORY OF PRESENT ILLNESS:   Patient is a  76 y.o. male with medical history of diabetes mellitus, coronary artery disease on plavix , paroxysmal atrial fibrillation on coumadin , CKD stage III, rheumatoid arthritis, ischemic cardiomyopathy status post ICD presented on 11/12 with 3-day history of increasing pain, edema, erythema about his left foot. He stepped on a nail that 2 weeks prior to this admission. The patient was soaking his foot in Epsom salts over the period of 2 weeks.   He visited emergency department on September 24, 2017.  X-rays were negative for any erosions.  The patient was sent home with ciprofloxacin and clindamycin.  However, over the past 2-3 days he has noted increasing edema, pain, and erythema and returned to the emergency department.   Patient was started on broad spectrum abx and switched to oral doxycycline but his cellulitis got worse and was restarted on IV abx. During this admission patient also developed blood bowel movements with subsequent hypotension(11/16). He was given multiple units of blood and FFP for coumadin reversal.   Patient was planned for EGD on 11/20. He was given sedatives which caused patient to become hypoxic and cyanotic. He was subsequently intubated. Patient was also evaluated by surgery for his foot who recommended patient be transferred to [facility] for vascular surgery evaluation for possible intervention due to occlusive vascular disease being found in workup.   Patient was seen and evaluated in the ICU. Resting comfortably on the vent on propofol drip. BP stable with HR 55-60 paced.    PAST MEDICAL HISTORY :  He  has a past medical history of AICD (automatic cardioverter/defibrillator) present, Anemia, Anemia-chronic, Aneurysm, cerebral, Arthritis, BPH  (benign prostatic hypertrophy), Cardiomyopathy, ischemic, Chronic kidney disease, stage III (moderate) (HCC), Coronary atherosclerosis of native coronary artery, Essential hypertension, benign, Hyperlipidemia, Hypothyroidism, LBBB (left bundle branch block), Morbid obesity (HCC), MRSA (methicillin resistant Staphylococcus aureus), Myocardial infarction (HCC), Paroxysmal atrial fibrillation (HCC), Pneumonia, Pulmonary nodule, TIA (transient ischemic attack), and Type 2 diabetes mellitus (HCC).  PAST SURGICAL HISTORY: He  has a past surgical history that includes Cholecystectomy; Total knee arthroplasty; Vasectomy; Lung surgery; ICD placement (05/06/2011); left heart catheterization with coronary angiogram (N/A, 04/17/2013); and BIV ICD GENERATOR CHANGEOUT (N/A, 03/18/2017).  Allergies  Allergen Reactions  . Allegra [Fexofenadine]   . Biaxin [Clarithromycin]   . Crestor [Rosuvastatin Calcium]   . Glucophage [Metformin Hcl]   . Trimethoprim   . Codeine Other (See Comments)    blisters, but able to take hydrocodone    No current facility-administered medications on file prior to encounter.    Current Outpatient Medications on File Prior to Encounter  Medication Sig  . allopurinol (ZYLOPRIM) 100 MG tablet Take 100 mg by mouth daily.  . carvedilol (COREG) 12.5 MG tablet TAKE 1 AND 1/2 TABLETS BY MOUTH TWICE DAILY WITH MEALS (DUE FOR FOLLOW UP)  . ciprofloxacin (CIPRO) 500 MG tablet Take 1 tablet (500 mg total) every 12 (twelve) hours by mouth.  . clindamycin (CLEOCIN) 150 MG capsule Take 2 capsules (300 mg total) 3 (three) times daily by mouth. May dispense as 150mg  capsules  . clopidogrel (PLAVIX) 75 MG tablet Take 1 tablet (75 mg total) by mouth daily.  . hydrALAZINE (APRESOLINE) 25 MG tablet TAKE ONE TABLET BY MOUTH THREE TIMES DAILY  .  HYDROcodone-acetaminophen (NORCO) 7.5-325 MG tablet Take 1 tablet by mouth 4 (four) times daily as needed for moderate pain.  Marland Kitchen insulin lispro protamine-insulin  lispro (HUMALOG 75/25) (75-25) 100 UNIT/ML SUSP Inject 22-35 Units into the skin See admin instructions. Takes 35 units in the morning with breakfast and 35 units at supper (Will take 22-25 at lunch time occasionally)  . isosorbide mononitrate (IMDUR) 30 MG 24 hr tablet TAKE ONE TABLET BY MOUTH ONCE DAILY  . levothyroxine (SYNTHROID, LEVOTHROID) 50 MCG tablet Take 50 mcg by mouth daily.  Marland Kitchen lisinopril (PRINIVIL,ZESTRIL) 2.5 MG tablet Take 2.5 mg by mouth daily.  Marland Kitchen loratadine (CLARITIN) 10 MG tablet Take 10 mg by mouth daily.  . nitroGLYCERIN (NITROSTAT) 0.4 MG SL tablet Place 0.4 mg under the tongue every 5 (five) minutes as needed for chest pain. For chest pain  . sulfaSALAzine (AZULFIDINE EN-TABS) 500 MG EC tablet Take 1 tablet (500 mg total) by mouth 2 (two) times daily.  . tamsulosin (FLOMAX) 0.4 MG CAPS capsule Take 0.4 mg by mouth.  . torsemide (DEMADEX) 10 MG tablet Take 1 tablet (10 mg total) by mouth 2 (two) times daily.  Marland Kitchen warfarin (COUMADIN) 5 MG tablet Take 0.5-1 tablets (2.5-5 mg total) by mouth daily at 6 PM. Takes 2.5mg  on Sunday, Monday, Wednesday and Friday  Takes 5mg  on Tuesday, Thursday and Sunday  . Zinc 50 MG CAPS Take 50 mg by mouth daily.   Marland Kitchen doxycycline (VIBRA-TABS) 100 MG tablet Take 1 tablet (100 mg total) by mouth 2 (two) times daily. (Patient not taking: Reported on 09/27/2017)    FAMILY HISTORY:  His indicated that his mother is deceased. He indicated that his father is deceased. He indicated that his sister is deceased. He indicated that his brother is alive. He indicated that the status of his other is unknown.   SOCIAL HISTORY: He  reports that he quit smoking about 52 years ago. His smoking use included cigarettes. He started smoking about 67 years ago. He has a 6.00 pack-year smoking history. He quit smokeless tobacco use about 52 years ago. He reports that he drinks alcohol. He reports that he does not use drugs.  REVIEW OF SYSTEMS:   Unable to obtain     VITAL SIGNS: BP (!) 132/44   Pulse (!) 59   Temp (!) 96.6 F (35.9 C)   Resp 16   Ht 5\' 8"  (1.727 m)   Wt 266 lb 5.1 oz (120.8 kg)   SpO2 100%   BMI 40.49 kg/m   HEMODYNAMICS:    VENTILATOR SETTINGS: Vent Mode: PRVC FiO2 (%):  [40 %-100 %] 40 % Set Rate:  [14 bmp-16 bmp] 16 bmp Vt Set:  [550 mL] 550 mL PEEP:  [5 cmH20] 5 cmH20 Plateau Pressure:  [15 cmH20-17 cmH20] 17 cmH20  INTAKE / OUTPUT: I/O last 3 completed shifts: In: 4542.5 [I.V.:2164.5; Blood:1828; IV Piggyback:550] Out: 2377 [Urine:2375; Stool:2]  PHYSICAL EXAMINATION: General:  Elderly male appropriate for age on vent comfortable NAD Neuro:  Sedated PERRLA Cardiovascular:  s1 s2 rrr Lungs:  Fair air entry b/l  Abdomen:  Soft +bs  +dark gastric content coming from NGT Musculoskeletal:  +pulses in B/L upper ext  +RUE PICC  +femoral and popliteal pulses B/L  Weak PT and DP pulses B/L  Tense swelling noted in 1st-3rd digits of the left foot which is tender on exam. Patient withdraws ext to touch. +capillary refill  Skin:  Intact   LABS:  BMET Recent Labs  Lab 10/04/17 0451  10/04/17 1837 10/05/17 0624  NA 134* 135 139  K 5.5* 4.8 4.9  CL 109 110 111  CO2 20* 21* 22  BUN 33* 44* 42*  CREATININE 2.08* 1.91* 2.00*  GLUCOSE 222* 236* 250*    Electrolytes Recent Labs  Lab 09/29/17 0440  10/04/17 0451 10/04/17 1837 10/05/17 0624  CALCIUM 9.1   < > 7.9* 8.2* 8.4*  MG 2.1  --   --   --   --    < > = values in this interval not displayed.    CBC Recent Labs  Lab 10/04/17 0451 10/04/17 1837 10/05/17 0624  WBC 19.2* 18.3* 18.4*  HGB 7.0* 7.6* 7.4*  HCT 22.3* 23.1* 23.2*  PLT 350 273 291    Coag's Recent Labs  Lab 10/03/17 0623 10/04/17 0451 10/05/17 0624  INR 3.25 4.52* 1.35    Sepsis Markers No results for input(s): LATICACIDVEN, PROCALCITON, O2SATVEN in the last 168 hours.  ABG Recent Labs  Lab 10/05/17 1543  PHART 7.332*  PCO2ART 40.7  PO2ART 299*    Liver  Enzymes No results for input(s): AST, ALT, ALKPHOS, BILITOT, ALBUMIN in the last 168 hours.  Cardiac Enzymes No results for input(s): TROPONINI, PROBNP in the last 168 hours.  Glucose Recent Labs  Lab 10/05/17 0405 10/05/17 0716 10/05/17 1051 10/05/17 1548 10/05/17 1834 10/05/17 2006  GLUCAP 241* 227* 215* 185* 175* 171*    Imaging Dg Chest Port 1 View  Result Date: 10/05/2017 CLINICAL DATA:  76 year old male intubated.  Admitted with GI bleed. EXAM: PORTABLE CHEST 1 VIEW COMPARISON:  10/04/2017 and earlier. FINDINGS: Portable AP upright view at 1438 hours. Endotracheal tube tip in good position between the clavicles and carina. Stable left chest cardiac AICD. Stable right PICC line. Enteric tube courses to the abdomen and loops in the left upper quadrant. Increased retrocardiac opacity obscuring the medial aspect of the left hemidiaphragm. Mildly lower lung volumes. Stable cardiac size and mediastinal contours. No pneumothorax, pulmonary edema, or definite pleural effusion. IMPRESSION: 1. Endotracheal tube in good position. Enteric tube looped in the stomach. 2. Increased lower lobe collapse or consolidation since yesterday. Mildly lower lung volumes. Electronically Signed   By: Odessa Fleming M.D.   On: 10/05/2017 14:55     STUDIES:    CULTURES: MRSA negative  Blood cultures 11/17 -NTD x 5 days   ANTIBIOTICS: Vancomycin Zosyn  SIGNIFICANT EVENTS: Intubated 11/20   LINES/TUBES: PICC RUE PIV  DISCUSSION: 76 year old male with many cormorbidites with left LLE cellulitis intubated for sudden hypoxia during EGS transferred to [facility] for further vascular eval  ASSESSMENT / PLAN:  PULMONARY A: VDRF secondary to Acute hypoxic respiratory failure secondary to sedation P:   Patient currently tolerating vent VAP precautions Hold SBT until after EGD   CARDIOVASCULAR A: Hypotension secondary to hemorraghic shock vs septic shock PVD  Hx of paroxysmal a fib, CAD, ischemic  cardiomyopathy s/p ICD P:  BP stable currently and not requiring vasopressors.  Coumadin and Plavix held due to GI bleed Patient is high risk due to high CHADVASC and will need antiplatelet started in the future. Will wait for EGD results Seen and evaluated by vascular surgery. Patient will need to be transferred to [facility] for further intervention after EGD  RENAL A:   CKD III P:   Avoid nephrotoxic medications Cr around baseline Monitor I/O Foley placed  GASTROINTESTINAL A:   Acute GI bleed in setting of coumadin and Plavix  P:   EGD planned  tomorrow Protonix ggt  NPO F/U repeat Hg and transfuse for goal >7 No clinical sign of active large volume blood loss. Dark gastric content in NGT  HEMATOLOGIC A:   Acute Blood anemia secondary to Upper GI Bleed  Supratheraputic  INR secondary to coumadin s/p FFP P:  F/U repeat Hg F/u Pt/PTT/INR Coumadin and Plavix held   INFECTIOUS A:   LLE cellulitis  P:   Continue with broad spectrum abx No crepitus on exam    ENDOCRINE A:   DM II hypothyroidism P:   Tight glycemic control in setting of infection Levothyroxine   NEUROLOGIC A:  No acute issues  P:   RASS goal: -1 Daily sedation vacation      Lionel December D.O  Pulmonary and Critical Care Medicine [facility] Pager: [phone]  10/05/2017, 10:27 PM

## 2017-10-05 NOTE — Consult Note (Signed)
 [facility] [place]  Patient name: [name]  MRN: [id number]        DOB: 12/24/1940          Sex: male   REQUESTING PROVIDER:    Hospital Service            REASON FOR CONSULT:    Left foot infection  HISTORY OF PRESENT ILLNESS:   [name] is a 76 y.o. male, who was transferred from [facility] this evening.  He presented to the emergency department on 09/27/2017 with a 3-day history of pain swelling and redness in his left foot.  He stated that he stepped on a nail that went through his shoe approximately 2 weeks prior to the admission.  He had been soaking his foot in Epsom salts.  He was in the emergency department on 09/24/2017 and x-rays were negative and he was sent home with oral antibiotics.  His symptoms got worse and he represented to the hospita and was admitted for worsening cellulitis.  Surgical consultation was obtained.  Segmental pressures were ordered.  The left ABI was unobtainable due to noncompressibility of the vessels.  The study did reveal occlusive vascular disease.  On 10/04/2017 he had a bloody bowel movement  As well as hematemesis.  He was hypotensive and diaphoretic.  He had been on anticoagulation with a significantly elevated INR.  He was given reversal agents and IV fluids and transferred to the ICU.  He was scheduled for endoscopy on 1120 and became apneic after anesthesia requiring intubation.  The procedure was aborted.  The patient was subsequently transferred to [facility].  He remains intubated.  Patient suffers from Diabetes.  His hemoglobin A1c is 7.8.  He has acute on chronic renal failure (CKD stage III.  He suffers from paroxysmal atrial fibrillation on anticoagulation.  He also has coronary artery disease and has undergone multiple percutaneous interventions.  He has ischemic cardiomyopathy with an ejection fraction of 55-60%.  His blood pressure is well controlled.   He suffers from hypercholesterolemia.  PAST MEDICAL HISTORY        Past Medical History:  Diagnosis Date  . AICD (automatic cardioverter/defibrillator) present   . Anemia   . Anemia-chronic    a. Pt reports h/o anemia - was offered IV iron in past by nephrologist but declined and has taken PO instead.  . Aneurysm, cerebral   . Arthritis    bilateral wrist and fingers  . BPH (benign prostatic hypertrophy)   . Cardiomyopathy, ischemic    LVEF 25-35%.  . Chronic kidney disease, stage III (moderate) (HCC)    Followed by Dr. Befakadu.  . Coronary atherosclerosis of native coronary artery    a. DES to RCA 03/2009. b. s/p PTCA to RCA for ISR, 05/2010. c. 03/2013 NSTEMI 2/2 severe prox RCA s/p PTCA/DES, med rx for residual dz.  . Essential hypertension, benign   . Hyperlipidemia   . Hypothyroidism   . LBBB (left bundle branch block)    s/p BiV ICD Implanted by Dr Klein  . Morbid obesity (HCC)   . MRSA (methicillin resistant Staphylococcus aureus)   . Myocardial infarction (HCC)   . Paroxysmal atrial fibrillation (HCC)    a. Coumadin discontinue in 2010 when the patient required ASA/Plavix for stenting. b. Coumadin restarted 05/2013, Plavix continued, ASA stopped.   . Pneumonia    years ago 1967  . Pulmonary nodule    CXR, 03/2013, [facility] - not described   on any subsequent chest x-rays however, could be a vascular structure  . TIA (transient ischemic attack)    Multiple TIAs  . Type 2 diabetes mellitus (HCC)      FAMILY HISTORY        Family History  Problem Relation Age of Onset  . Diabetes Mother        Bilateral leg amputation  . Heart disease Mother        Before age 60  . Hypertension Mother   . Liver disease Mother        fatty liver   . Cancer Father        Prostate and Bone  . Diabetes Father   . Cancer Brother        Prostate  . Heart disease Other        family h/o premature cardiovascular disease  . Cancer  Sister        Colon   . Hypertension Sister     SOCIAL HISTORY:   Social History        Socioeconomic History  . Marital status: Married    Spouse name: Not on file  . Number of children: Not on file  . Years of education: Not on file  . Highest education level: Not on file  Social Needs  . Financial resource strain: Not on file  . Food insecurity - worry: Not on file  . Food insecurity - inability: Not on file  . Transportation needs - medical: Not on file  . Transportation needs - non-medical: Not on file  Occupational History  . Not on file  Tobacco Use  . Smoking status: Former Smoker    Packs/day: 1.00    Years: 6.00    Pack years: 6.00    Types: Cigarettes    Start date: 12/08/1949    Last attempt to quit: 11/16/1964    Years since quitting: 52.9  . Smokeless tobacco: Former User    Quit date: 04/15/1965  Substance and Sexual Activity  . Alcohol use: Yes    Alcohol/week: 0.0 oz    Comment: once/month  . Drug use: No  . Sexual activity: Not on file  Other Topics Concern  . Not on file  Social History Narrative  . Not on file    ALLERGIES:         Allergies  Allergen Reactions  . Allegra [Fexofenadine]   . Biaxin [Clarithromycin]   . Crestor [Rosuvastatin Calcium]   . Glucophage [Metformin Hcl]   . Trimethoprim   . Codeine Other (See Comments)    blisters, but able to take hydrocodone    CURRENT MEDICATIONS:             Current Facility-Administered Medications  Medication Dose Route Frequency Provider Last Rate Last Dose  . allopurinol (ZYLOPRIM) tablet 100 mg  100 mg Oral Daily Tat, David, MD   100 mg at 10/05/17 1008  . carvedilol (COREG) tablet 12.5 mg  12.5 mg Oral BID WC Thomas, Tijo, DO   12.5 mg at 10/05/17 0818  . chlorhexidine gluconate (MEDLINE KIT) (PERIDEX) 0.12 % solution 15 mL  15 mL Mouth Rinse BID Hawkins, Edward, MD   15 mL at 10/05/17 2036  . [START ON 10/06/2017] Chlorhexidine  Gluconate Cloth 2 % PADS 6 each  6 each Topical Daily Gray, Walter J, MD      . fentaNYL (SUBLIMAZE) injection 50 mcg  50 mcg Intravenous Q2H PRN Hawkins, Edward, MD   50 mcg at 10/05/17   1642  . hydrALAZINE (APRESOLINE) tablet 25 mg  25 mg Oral TID Thomas, Tijo, DO   Stopped at 10/05/17 1053  . hydrOXYzine (ATARAX/VISTARIL) tablet 10 mg  10 mg Oral TID PRN Johnson, Clanford L, MD   10 mg at 10/01/17 1213  . insulin aspart (novoLOG) injection 0-9 Units  0-9 Units Subcutaneous Q4H Johnson, Clanford L, MD   2 Units at 10/05/17 2022  . insulin glargine (LANTUS) injection 30 Units  30 Units Subcutaneous Daily Johnson, Clanford L, MD   30 Units at 10/05/17 1009  . isosorbide mononitrate (IMDUR) 24 hr tablet 30 mg  30 mg Oral Daily Thomas, Tijo, DO   Stopped at 10/05/17 1053  . levothyroxine (SYNTHROID, LEVOTHROID) tablet 50 mcg  50 mcg Oral QAC breakfast Tat, David, MD   50 mcg at 10/05/17 0818  . loratadine (CLARITIN) tablet 10 mg  10 mg Oral Daily Hernandez Acosta, Estela Y, MD   10 mg at 10/05/17 1008  . MEDLINE mouth rinse  15 mL Mouth Rinse QID Hawkins, Edward, MD   15 mL at 10/05/17 1618  . norepinephrine (LEVOPHED) 4 mg in dextrose 5 % 250 mL (0.016 mg/mL) infusion  0-40 mcg/min Intravenous Titrated McQuaid, Douglas B, MD      . pantoprazole (PROTONIX) 80 mg in sodium chloride 0.9 % 250 mL (0.32 mg/mL) infusion  8 mg/hr Intravenous Continuous Smith, Rondell A, MD 25 mL/hr at 10/05/17 1802 8 mg/hr at 10/05/17 1802  . [START ON 10/07/2017] pantoprazole (PROTONIX) injection 40 mg  40 mg Intravenous Q12H Smith, Rondell A, MD      . piperacillin-tazobactam (ZOSYN) IVPB 3.375 g  3.375 g Intravenous Q8H Hernandez Acosta, Estela Y, MD 12.5 mL/hr at 10/05/17 2023 3.375 g at 10/05/17 2023  . propofol (DIPRIVAN) 1000 MG/100ML infusion  0-50 mcg/kg/min Intravenous Continuous Hawkins, Edward, MD 18.1 mL/hr at 10/05/17 2023 25 mcg/kg/min at 10/05/17 2023  . sodium chloride flush (NS) 0.9 % injection 10-40 mL   10-40 mL Intracatheter Q12H Gray, Walter J, MD   10 mL at 10/05/17 2104  . sodium chloride flush (NS) 0.9 % injection 10-40 mL  10-40 mL Intracatheter PRN Gray, Walter J, MD      . tamsulosin (FLOMAX) capsule 0.4 mg  0.4 mg Oral QPC supper Tat, David, MD   0.4 mg at 10/04/17 1632  . vancomycin (VANCOCIN) IVPB 1000 mg/200 mL premix  1,000 mg Intravenous Q24H Johnson, Clanford L, MD 200 mL/hr at 10/05/17 2103 1,000 mg at 10/05/17 2103  . Warfarin - Pharmacist Dosing Inpatient   Does not apply Q24H Tat, David, MD        REVIEW OF SYSTEMS:   Unable to obtain secondary to intubation  PHYSICAL EXAM:         Vitals:   10/05/17 1810 10/05/17 1900 10/05/17 2000 10/05/17 2055  BP:  (!) 126/58 (!) 108/49 (!) 123/46  Pulse:  64 (!) 58 (!) 55  Resp:  16 15 16  Temp: 97.9 F (36.6 C) (!) 97.3 F (36.3 C) (!) 97 F (36.1 C)   TempSrc: Oral Core (Comment)    SpO2:  100% 100% 100%  Weight:      Height:        GENERAL: The patient is a well-nourished male, in no acute distress. The vital signs are documented above. CARDIAC: There is a regular rate and rhythm.  VASCULAR: I cannot palpate a left pedal pulse.  There is significant edema in the leg. PULMONARY: Nonlabored respirations ABDOMEN: Soft and   non-tender with normal pitched bowel sounds.  MUSCULOSKELETAL: There are no major deformities or cyanosis. NEUROLOGIC: No focal weakness or paresthesias are detected. SKIN: Erythema and open bulla on the dorsum of the foot PSYCHIATRIC: The patient has a normal affect.  STUDIES:   I have reviewed his vascular lab studies with the following findings: Right:  Resting ABI in the moderate range of arterial occlusive disease.  Segmental Doppler signal at the ankle indicates proximal occlusive vascular disease.  Left:  Non obtainable ABI on the left with the segmental Doppler exam at the ankle demonstrating more proximal occlusive vascular disease. If the patient is  a candidate for revascularization, anatomic imaging may be considered such as formal angiogram or CT angiogram/runoff.  ASSESSMENT and PLAN   Diabetic foot infection: The patient has vascular insufficiency based on segmental Doppler pressures.  He has a significant wound on the dorsum of his foot.  He will need arterial evaluation via angiography once he stabilizes clinically.  This will need to be done at [facility], and he will require transfer following stabilization of his GI bleed.  Continue IV antibiotics for his localized infection.  Continue IV hydration for his acute on chronic renal insufficiency.  I spoke with his wife tonight and outlined our plan.  If he stabilizes from his GI bleed, I will proceed with his angiography on Friday at [facility]   Wells Rustin Erhart, MD [facility] [place] Tel [phone] Pager [phone]    

## 2017-10-05 NOTE — Progress Notes (Signed)
Report called to Thayer Ohm, RN at [facility] ICU. Patient being transported via [facility].

## 2017-10-05 NOTE — Care Management Note (Signed)
Case Management Note  Patient Details  Name: HEBERT DOOLING MRN: [id number] Date of Birth: 06/17/1941  Subjective/Objective: Adm from home with Left foot cellulitis. Lives with wife, ind with ADL's. Has RW already at home if needed. Patient developed hematochezia and hematemesis, scheduled EGD today. No HH pta.                  Action/Plan:Anticipate DC home with self care. Patient evaluated by PT and no recommendations made for home health. If patient need [facility] RN, patient has had [facility] at home in the past.   Expected Discharge Date:       10/06/2017           Expected Discharge Plan:     In-House Referral:     Discharge planning Services  CM Consult  Post Acute Care Choice:    Choice offered to:     DME Arranged:    DME Agency:     HH Arranged:    HH Agency:     Status of Service:  In process, will continue to follow  If discussed at Long Length of Stay Meetings, dates discussed:    Additional Comments:  Amy Belloso, Chrystine Oiler, RN 10/05/2017, 10:09 AM

## 2017-10-05 NOTE — Progress Notes (Signed)
10/05/2017 2:13 PM  Events noted.  I spoke with Dr. Darrick Penna who asked to have him transferred to [facility]. I arranged for him to transfer to ICU.  No beds available at [facility] but there was a bed available at [facility].  I spoke with Dr. Wallace Cullens and signed out care to him and he accepted patient.  Maryln Manuel MD

## 2017-10-05 NOTE — Progress Notes (Signed)
Patient came back from EGD on ventilator. He is currently awaiting transport to [facility]. Wife at bedside and understands all of this. Wife took all of his personal belongings home and I let her know that I will call her as soon as bed is available and patient is ready to be transported.

## 2017-10-05 NOTE — Progress Notes (Signed)
Initial Nutrition Assessment  DOCUMENTATION CODES:  Obesity unspecified  INTERVENTION:  Glucerna Shake po BID, each supplement provides 220 kcal and 10 grams of protein  Will order 30 mL Prostat BID, each supplement provides 100 kcal and 15 grams of protein.  Meal requests noted, passed to dietary  NUTRITION DIAGNOSIS:  Inadequate oral intake related to nausea, decreased appetite(Both reported r/t iv abx) as evidenced by meal completion < 50%, per patient/family report.  GOAL:  Patient will meet greater than or equal to 90% of their needs  MONITOR:  PO intake, Supplement acceptance, Diet advancement, Labs, Weight trends, I & O's  REASON FOR ASSESSMENT:  Consult Assessment of nutrition requirement/status  ASSESSMENT:  76 y/o male PMHx DM2, CAD, AFIB, CKD3, RA, HF, s/p ICD, MI, HLD/HTN. Had stepped on nail w/ L foot 2 weeks prior to admission. Visited ED 11/8 and placed on abx. Represented to hospital 11/12 after 3 days of increasing pain, edema, erythema to left foot. Admitted for L foot infection refractory to outpatient oral abx. RD consulted for assessment on Day 8 of admission.   Interval hx since admission. Patient's cellulitis has improved, however, early yesterday the patient developed hematemesis and hematochezia requiring emergent blood transfusion. INR was >4. Transferred to ICU. Now NPO w/ planned EGD this am.   Pt lethargic on RD arrival. His wife is at bedside and provides all information.   She states the patient has eaten very poorly since admission and she attributes this to the IV abx. She says the patient has no appetite and has altered taste since starting them. The last time he ate, per her report, was Saturday or Sunday and she says all he had was some fruit.   This is consistent with chart information. Last meal intake was noon Sunday. From what information is available, He has eaten </=50% of his meals since 11/13.   At home, the patient was taking D3 and  Zinc. He was additionally taking an essential mvi, but she says the pt was asked to stop taking this; she doesn't remember why. He followed a DM diet and "stayed on top of his Blood sugars". He did not have any n/v/c/d (did take prn stool softner/laxative) and had a good appetite PTA, even though he had a wound. Appetite only declined after beginning IV abx.   Patient was admitted at weight of ~248 lbs. Wife says the pt "stays around there". This is consistent with chart information. Wt stable this year at 248-253 lbs.  Bed weight today was 120.8 kg or 266 lbs. Likely wt gain related to fluid retention. Has slight generalized swelling.   We discussed how Warfarins affect can be increased if PO intake is poor. Therefore, improving his PO intake may help with lowering INR. Reviewed potential interventions. The wife had brought in choc glucerna. She thought he would like this more than vanilla, but will order anyway. Additionally, Will order prostat BID. Patient likes the breakfast, but not the large L+D meals. She thought he would do very well with tomato soup. Can order once diet advanced.   Physical Exam: WDL, Obese, abdomen slightly firm, slight generalized swelling  Labs: BG 220-240 (increasing), WBC: 18.4 (increasing), H/H:7.4/23.2, BUN/Creat:42/2.0 (stable) Meds: Insulin, PPI, IVF, IV ABx  Recent Labs  Lab 09/29/17 0440  10/04/17 0451 10/04/17 1837 10/05/17 0624  NA 129*   < > 134* 135 139  K 5.1   < > 5.5* 4.8 4.9  CL 96*   < > 109 110 111  CO2 21*   < > 20* 21* 22  BUN 44*   < > 33* 44* 42*  CREATININE 1.97*   < > 2.08* 1.91* 2.00*  CALCIUM 9.1   < > 7.9* 8.2* 8.4*  MG 2.1  --   --   --   --   GLUCOSE 249*   < > 222* 236* 250*   < > = values in this interval not displayed.   NUTRITION - FOCUSED PHYSICAL EXAM:   Most Recent Value  Orbital Region  No depletion  Upper Arm Region  No depletion  Thoracic and Lumbar Region  No depletion  Buccal Region  No depletion  Temple Region   No depletion  Clavicle Bone Region  No depletion  Clavicle and Acromion Bone Region  No depletion  Scapular Bone Region  No depletion  Dorsal Hand  No depletion  Patellar Region  No depletion  Anterior Thigh Region  No depletion  Posterior Calf Region  No depletion  Edema (RD Assessment)  Mild       Diet Order:  Diet NPO time specified  EDUCATION NEEDS:  No education needs have been identified at this time  Skin:  Cellulitis to L foot.   Last BM:  11/20- diarrhea/black  Height:  Ht Readings from Last 1 Encounters:  10/04/17 5\' 8"  (1.727 m)   Weight:  Wt Readings from Last 1 Encounters:  10/05/17 266 lb 5.1 oz (120.8 kg)   Wt Readings from Last 10 Encounters:  10/05/17 266 lb 5.1 oz (120.8 kg)  08/04/17 253 lb (114.8 kg)  06/30/17 255 lb (115.7 kg)  06/11/17 253 lb (114.8 kg)  06/07/17 253 lb (114.8 kg)  03/30/17 256 lb (116.1 kg)  03/18/17 250 lb (113.4 kg)  03/02/17 256 lb (116.1 kg)  02/26/17 251 lb (113.9 kg)  11/26/16 248 lb (112.5 kg)    Ideal Body Weight:  70 kg  BMI:  Body mass index is 40.49 kg/m.  Estimated Nutritional Needs:  Kcal:  1800-2000 kcals Protein:  90-100g Pro  Fluid:  Per MD  01/24/17 RD, LDN, CNSC Clinical Nutrition Pager: Christophe Louis 10/05/2017 10:47 AM

## 2017-10-05 NOTE — Progress Notes (Signed)
Notified MD Mcquaid with PCCM in regards to patient's BP being in the systolic of 90s, currently titrating down propofol. MD Mcquaid ordered for Levophed standard IV gtt to be placed. Verbal ordered with confirmation over phone. Will place, and continue to monitor and assess.

## 2017-10-05 NOTE — Anesthesia Postprocedure Evaluation (Signed)
Anesthesia Post Note  Patient: [name]  Procedure(s) Performed: ESOPHAGOGASTRODUODENOSCOPY (EGD) WITH PROPOFOL (N/A )  Patient location during evaluation: A-ICU Anesthesia Type: MAC Level of consciousness: sedated Pain management: pain level controlled Vital Signs Assessment: post-procedure vital signs reviewed and stable Respiratory status: patient on ventilator - see flowsheet for VS Cardiovascular status: stable Postop Assessment: no apparent nausea or vomiting Anesthetic complications: yes Anesthetic complication details: anesthesia complicationsComments: Patient respiratory arrested following propofol drip start.  See notes.     Last Vitals:  Vitals:   10/05/17 1416 10/05/17 1422  BP:    Pulse: 82   Resp: 19   Temp:    SpO2: 100% 100%    Last Pain:  Vitals:   10/05/17 1000  TempSrc:   PainSc: 0-No pain                 Alexzandra Bilton

## 2017-10-05 NOTE — Progress Notes (Signed)
10/05/2017 10:20 AM  I called and spoke with [facility] in [place].  They are aware of patient being transferred to [facility] and they will consult on him when he arrives.  Dr. Myra Gianotti is on call today.  I spoke with [facility] patient placement to make them aware of transfer and updated sign out.  The bed situation at [facility] is tight and not sure if he will be transferred today or tomorrow.  I spoke with Dr. Lovell Sheehan and it is ok if he is transferred tomorrow because of bed availability.   Cherolyn Behrle [employer]

## 2017-10-05 NOTE — Transfer of Care (Signed)
Immediate Anesthesia Transfer of Care Note  Patient: [name]  Procedure(s) Performed: ESOPHAGOGASTRODUODENOSCOPY (EGD) WITH PROPOFOL (N/A )  Patient Location: PACU and ICU  Anesthesia Type:MAC  Level of Consciousness: sedated  Airway & Oxygen Therapy: Patient remains intubated per anesthesia plan and Patient placed on Ventilator (see vital sign flow sheet for setting)  Post-op Assessment: Report given to RN  Post vital signs: Reviewed and stable  Last Vitals:  Vitals:   10/05/17 1416 10/05/17 1422  BP:    Pulse: 82   Resp: 19   Temp:    SpO2: 100% 100%    Last Pain:  Vitals:   10/05/17 1000  TempSrc:   PainSc: 0-No pain      Patients Stated Pain Goal: 2 (28/36/62 9476)  Complications: respiratory complications, respiratory arrested with small dose of propofol,  Intubated, procedure aborted and transferred to ICU intubated and bagged with 100% O2.

## 2017-10-06 ENCOUNTER — Encounter: Payer: Self-pay | Admitting: Cardiology

## 2017-10-06 ENCOUNTER — Encounter (HOSPITAL_COMMUNITY)
Admission: EM | Disposition: A | Discharge status: Skilled Nursing Facility | Payer: Self-pay | Source: Home / Self Care | Attending: Pulmonary Disease

## 2017-10-06 ENCOUNTER — Inpatient Hospital Stay (HOSPITAL_COMMUNITY): Payer: PPO

## 2017-10-06 ENCOUNTER — Encounter (HOSPITAL_COMMUNITY): Payer: Self-pay | Admitting: Gastroenterology

## 2017-10-06 DIAGNOSIS — J9601 Acute respiratory failure with hypoxia: Secondary | ICD-10-CM

## 2017-10-06 HISTORY — PX: ESOPHAGOGASTRODUODENOSCOPY: SHX5428

## 2017-10-06 LAB — CBC
HCT: 21.2 % — ABNORMAL LOW (ref 39.0–52.0)
Hemoglobin: 7.1 g/dL — ABNORMAL LOW (ref 13.0–17.0)
MCH: 31.6 pg (ref 26.0–34.0)
MCHC: 33.5 g/dL (ref 30.0–36.0)
MCV: 94.2 fL (ref 78.0–100.0)
Platelets: 225 10*3/uL (ref 150–400)
RBC: 2.25 MIL/uL — ABNORMAL LOW (ref 4.22–5.81)
RDW: 16.7 % — ABNORMAL HIGH (ref 11.5–15.5)
WBC: 10.9 10*3/uL — ABNORMAL HIGH (ref 4.0–10.5)

## 2017-10-06 LAB — VANCOMYCIN, TROUGH: VANCOMYCIN TR: 20 ug/mL (ref 15–20)

## 2017-10-06 LAB — GLUCOSE, CAPILLARY
GLUCOSE-CAPILLARY: 143 mg/dL — AB (ref 65–99)
GLUCOSE-CAPILLARY: 128 mg/dL — AB (ref 65–99)
GLUCOSE-CAPILLARY: 117 mg/dL — AB (ref 65–99)
GLUCOSE-CAPILLARY: 101 mg/dL — AB (ref 65–99)
GLUCOSE-CAPILLARY: 119 mg/dL — AB (ref 65–99)
Glucose-Capillary: 154 mg/dL — ABNORMAL HIGH (ref 65–99)
Glucose-Capillary: 162 mg/dL — ABNORMAL HIGH (ref 65–99)

## 2017-10-06 LAB — LACTIC ACID, PLASMA: Lactic Acid, Venous: 0.6 mmol/L (ref 0.5–1.9)

## 2017-10-06 LAB — BASIC METABOLIC PANEL
Anion gap: 2 — ABNORMAL LOW (ref 5–15)
BUN: 44 mg/dL — ABNORMAL HIGH (ref 6–20)
CO2: 23 mmol/L (ref 22–32)
Calcium: 8.3 mg/dL — ABNORMAL LOW (ref 8.9–10.3)
Chloride: 116 mmol/L — ABNORMAL HIGH (ref 101–111)
Creatinine, Ser: 1.97 mg/dL — ABNORMAL HIGH (ref 0.61–1.24)
GFR calc Af Amer: 36 mL/min — ABNORMAL LOW (ref >60)
GFR calc non Af Amer: 31 mL/min — ABNORMAL LOW (ref >60)
Glucose, Bld: 163 mg/dL — ABNORMAL HIGH (ref 65–99)
Potassium: 4.2 mmol/L (ref 3.5–5.1)
Sodium: 141 mmol/L (ref 135–145)

## 2017-10-06 LAB — PREPARE RBC (CROSSMATCH)

## 2017-10-06 LAB — HEMOGLOBIN AND HEMATOCRIT, BLOOD
HCT: 24.8 % — ABNORMAL LOW (ref 39.0–52.0)
Hemoglobin: 8.3 g/dL — ABNORMAL LOW (ref 13.0–17.0)

## 2017-10-06 LAB — BRAIN NATRIURETIC PEPTIDE: B Natriuretic Peptide: 501.7 pg/mL — ABNORMAL HIGH (ref 0.0–100.0)

## 2017-10-06 SURGERY — EGD (ESOPHAGOGASTRODUODENOSCOPY)
Anesthesia: Moderate Sedation

## 2017-10-06 SURGERY — LOWER EXTREMITY ANGIOGRAPHY
Anesthesia: LOCAL

## 2017-10-06 MED ORDER — SODIUM CHLORIDE 0.9 % IV SOLN: 500.0000 mg | Freq: Three times a day (TID) | INTRAVENOUS | Status: DC

## 2017-10-06 MED ORDER — FENTANYL CITRATE (PF) 100 MCG/2ML IJ SOLN
INTRAMUSCULAR | Status: AC
  Filled 2017-10-06: qty 2

## 2017-10-06 MED ORDER — HYDRALAZINE HCL 20 MG/ML IJ SOLN: 10.0000 mg | Freq: Four times a day (QID) | INTRAMUSCULAR | Status: DC | PRN

## 2017-10-06 MED ORDER — MIDAZOLAM HCL 5 MG/ML IJ SOLN
INTRAMUSCULAR | Status: AC
Start: 2017-10-06 — End: 2017-10-06
  Filled 2017-10-06: qty 2

## 2017-10-06 MED ORDER — VANCOMYCIN HCL IN DEXTROSE 750-5 MG/150ML-% IV SOLN
750.0000 mg | INTRAVENOUS | Status: DC
  Administered 2017-10-07: 750 mg via INTRAVENOUS
  Filled 2017-10-06: qty 150

## 2017-10-06 MED ORDER — SODIUM CHLORIDE 0.9 % IV SOLN
Freq: Once | INTRAVENOUS | Status: AC
  Administered 2017-10-06: 20:00:00 via INTRAVENOUS

## 2017-10-06 MED ORDER — HYDRALAZINE HCL 20 MG/ML IJ SOLN
10.0000 mg | Freq: Four times a day (QID) | INTRAMUSCULAR | Status: DC | PRN
  Administered 2017-10-07 – 2017-10-11 (×6): 20 mg via INTRAVENOUS
  Filled 2017-10-06 (×6): qty 1

## 2017-10-06 MED ORDER — SODIUM CHLORIDE 0.9 % IV SOLN: INTRAVENOUS | Status: DC

## 2017-10-06 MED ORDER — VANCOMYCIN HCL IN DEXTROSE 750-5 MG/150ML-% IV SOLN
750.0000 mg | INTRAVENOUS | Status: DC
  Filled 2017-10-06: qty 150

## 2017-10-06 MED ORDER — METOCLOPRAMIDE HCL 5 MG/ML IJ SOLN
10.0000 mg | Freq: Four times a day (QID) | INTRAMUSCULAR | Status: AC
  Administered 2017-10-06 – 2017-10-07 (×4): 10 mg via INTRAVENOUS
  Filled 2017-10-06 (×4): qty 2

## 2017-10-06 NOTE — Consult Note (Signed)
Subjective:   HPI  The patient is a 76 year old male with multiple medical problems including diabetes mellitus coronary artery disease and atrial fibrillation. There is also a history of ischemic cardiomyopathy he has an ICD. He was being treated for a left foot cellulitis at [facility] when he developed upper GI bleeding. He was on Plavix and Coumadin which have been stopped, and his INR has been corrected. An attempt at EGD was done yesterday by Dr. Oneida Alar but the patient stopped breathing during the anesthesia and the endoscope was never passed and the procedure was aborted. He was transferred to [facility] for further management. He is currently intubated, on a propofol drip, on PPI therapy. An NG tube is in place and draining some darkish colored blood.  The patient had a normal colonoscopy a little over 5 years ago.  Review of Systems Unobtainable  Past Medical History:  Diagnosis Date  . AICD (automatic cardioverter/defibrillator) present   . Anemia   . Anemia-chronic    a. Pt reports h/o anemia - was offered IV iron in past by nephrologist but declined and has taken PO instead.  . Aneurysm, cerebral   . Arthritis    bilateral wrist and fingers  . BPH (benign prostatic hypertrophy)   . Cardiomyopathy, ischemic    LVEF 25-35%.  . Chronic kidney disease, stage III (moderate) (HCC)    Followed by Dr. Hinda Lenis.  . Coronary atherosclerosis of native coronary artery    a. DES to RCA 03/2009. b. s/p PTCA to RCA for ISR, 05/2010. c. 03/2013 NSTEMI 2/2 severe prox RCA s/p PTCA/DES, med rx for residual dz.  . Essential hypertension, benign   . Hyperlipidemia   . Hypothyroidism   . LBBB (left bundle branch block)    s/p BiV ICD Implanted by Dr Caryl Comes  . Morbid obesity ([place])   . MRSA (methicillin resistant Staphylococcus aureus)   . Myocardial infarction ([place])   . Paroxysmal atrial fibrillation ([place])    a. Coumadin discontinue in 2010 when the patient required ASA/Plavix  for stenting. b. Coumadin restarted 05/2013, Plavix continued, ASA stopped.   . Pneumonia    years ago 4  . Pulmonary nodule    CXR, 03/2013, [facility] - not described on any subsequent chest x-rays however, could be a vascular structure  . TIA (transient ischemic attack)    Multiple TIAs  . Type 2 diabetes mellitus ([place])    Past Surgical History:  Procedure Laterality Date  . BIV ICD GENERATOR CHANGEOUT N/A 03/18/2017   Medtronic [place] MRI conditional CRT D device implated by Dr Rayann Heman  . CHOLECYSTECTOMY    . ESOPHAGOGASTRODUODENOSCOPY (EGD) WITH PROPOFOL N/A 10/05/2017   Procedure: ESOPHAGOGASTRODUODENOSCOPY (EGD) WITH PROPOFOL;  Surgeon: Danie Binder, MD;  Location: [facility];  Service: Endoscopy;  Laterality: N/A;  has ICD, just FYI  . ICD placement  05/06/2011   Medtronic CRT-D  . LEFT HEART CATHETERIZATION WITH CORONARY ANGIOGRAM N/A 04/17/2013   Procedure: LEFT HEART CATHETERIZATION WITH CORONARY ANGIOGRAM;  Surgeon: Thayer Headings, MD;  Location: [facility] CATH LAB;  Service: Cardiovascular;  Laterality: N/A;  . LUNG SURGERY     23 - 24 yrs. old  . TOTAL KNEE ARTHROPLASTY    . VASECTOMY     Social History   Socioeconomic History  . Marital status: Married    Spouse name: Not on file  . Number of children: Not on file  . Years of education: Not on file  . Highest education level:  Not on file  Social Needs  . Financial resource strain: Not on file  . Food insecurity - worry: Not on file  . Food insecurity - inability: Not on file  . Transportation needs - medical: Not on file  . Transportation needs - non-medical: Not on file  Occupational History  . Not on file  Tobacco Use  . Smoking status: Former Smoker    Packs/day: 1.00    Years: 6.00    Pack years: 6.00    Types: Cigarettes    Start date: 12/08/1949    Last attempt to quit: 11/16/1964    Years since quitting: 52.9  . Smokeless tobacco: Former User    Quit date: 04/15/1965  Substance and Sexual Activity  .  Alcohol use: Yes    Alcohol/week: 0.0 oz    Comment: once/month  . Drug use: No  . Sexual activity: Not on file  Other Topics Concern  . Not on file  Social History Narrative  . Not on file   family history includes Cancer in his brother, father, and sister; Diabetes in his father and mother; Heart disease in his mother and other; Hypertension in his mother and sister; Liver disease in his mother.  Current Facility-Administered Medications:  .  allopurinol (ZYLOPRIM) tablet 100 mg, 100 mg, Oral, Daily, Tat, David, MD, Stopped at 10/06/17 0948 .  carvedilol (COREG) tablet 12.5 mg, 12.5 mg, Oral, BID WC, Thomas, Tijo, DO, 12.5 mg at 10/05/17 0818 .  chlorhexidine gluconate (MEDLINE KIT) (PERIDEX) 0.12 % solution 15 mL, 15 mL, Mouth Rinse, BID, Hawkins, Edward, MD, 15 mL at 10/06/17 0750 .  Chlorhexidine Gluconate Cloth 2 % PADS 6 each, 6 each, Topical, Daily, Gray, Walter J, MD .  fentaNYL (SUBLIMAZE) injection 50 mcg, 50 mcg, Intravenous, Q2H PRN, Hawkins, Edward, MD, 50 mcg at 10/05/17 1642 .  hydrALAZINE (APRESOLINE) tablet 25 mg, 25 mg, Oral, TID, Thomas, Tijo, DO, Stopped at 10/05/17 1053 .  hydrOXYzine (ATARAX/VISTARIL) tablet 10 mg, 10 mg, Oral, TID PRN, Johnson, Clanford L, MD, 10 mg at 10/01/17 1213 .  insulin aspart (novoLOG) injection 0-9 Units, 0-9 Units, Subcutaneous, Q4H, Johnson, Clanford L, MD, 1 Units at 10/06/17 0748 .  insulin glargine (LANTUS) injection 30 Units, 30 Units, Subcutaneous, Daily, Johnson, Clanford L, MD, 30 Units at 10/05/17 1009 .  isosorbide mononitrate (IMDUR) 24 hr tablet 30 mg, 30 mg, Oral, Daily, Thomas, Tijo, DO, Stopped at 10/05/17 1053 .  levothyroxine (SYNTHROID, LEVOTHROID) tablet 50 mcg, 50 mcg, Oral, QAC breakfast, Tat, David, MD, 50 mcg at 10/06/17 0747 .  loratadine (CLARITIN) tablet 10 mg, 10 mg, Oral, Daily, Hernandez Acosta, Estela Y, MD, 10 mg at 10/05/17 1008 .  MEDLINE mouth rinse, 15 mL, Mouth Rinse, QID, Hawkins, Edward, MD, 15 mL at  10/06/17 0457 .  norepinephrine (LEVOPHED) 4 mg in dextrose 5 % 250 mL (0.016 mg/mL) infusion, 0-40 mcg/min, Intravenous, Titrated, McQuaid, Douglas B, MD .  pantoprazole (PROTONIX) 80 mg in sodium chloride 0.9 % 250 mL (0.32 mg/mL) infusion, 8 mg/hr, Intravenous, Continuous, Smith, Rondell A, MD, Last Rate: 25 mL/hr at 10/06/17 0747, 8 mg/hr at 10/06/17 0747 .  [START ON 10/07/2017] pantoprazole (PROTONIX) injection 40 mg, 40 mg, Intravenous, Q12H, Smith, Rondell A, MD .  piperacillin-tazobactam (ZOSYN) IVPB 3.375 g, 3.375 g, Intravenous, Q8H, Hernandez Acosta, Estela Y, MD, Stopped at 10/06/17 0733 .  propofol (DIPRIVAN) 1000 MG/100ML infusion, 0-50 mcg/kg/min, Intravenous, Continuous, Hawkins, Edward, MD, Last Rate: 14.5 mL/hr at 10/06/17 0747, 20 mcg/kg/min at 10/06/17   0747 .  sodium chloride flush (NS) 0.9 % injection 10-40 mL, 10-40 mL, Intracatheter, Q12H, Gray, Walter J, MD, 10 mL at 10/05/17 2104 .  sodium chloride flush (NS) 0.9 % injection 10-40 mL, 10-40 mL, Intracatheter, PRN, Gray, Walter J, MD .  tamsulosin (FLOMAX) capsule 0.4 mg, 0.4 mg, Oral, QPC supper, Tat, David, MD, 0.4 mg at 10/04/17 1632 .  vancomycin (VANCOCIN) IVPB 1000 mg/200 mL premix, 1,000 mg, Intravenous, Q24H, Johnson, Clanford L, MD, Stopped at 10/05/17 2205 .  Warfarin - Pharmacist Dosing Inpatient, , Does not apply, Q24H, Tat, David, MD Allergies  Allergen Reactions  . Allegra [Fexofenadine]   . Biaxin [Clarithromycin]   . Crestor [Rosuvastatin Calcium]   . Glucophage [Metformin Hcl]   . Trimethoprim   . Codeine Other (See Comments)    blisters, but able to take hydrocodone     Objective:     BP (!) 147/50   Pulse 63   Temp 97.9 F (36.6 C) (Core)   Resp 16   Ht 5' 8" (1.727 m)   Wt 121.8 kg (268 lb 8.3 oz)   SpO2 98%   BMI 40.83 kg/m   Patient is on a propofol drip and intubated  Heart irregular rhythm  Lungs clear  Abdomen soft and nontender  Laboratory No components found for: D1     Assessment:     Upper GI bleed      Plan:     Continue PPI therapy. Transfuse blood as needed. We will proceed with EGD today. Lab Results  Component Value Date   HGB 7.1 (L) 10/06/2017   HGB 7.3 (L) 10/05/2017   HGB 7.4 (L) 10/05/2017   HCT 21.2 (L) 10/06/2017   HCT 21.5 (L) 10/05/2017   HCT 23.2 (L) 10/05/2017   ALKPHOS 78 10/05/2017   ALKPHOS 85 06/15/2013   ALKPHOS 77 06/12/2013   AST 49 (H) 10/05/2017   AST 25 06/15/2013   AST 27 06/12/2013   ALT 24 10/05/2017   ALT 15 06/15/2013   ALT 15 06/12/2013   

## 2017-10-06 NOTE — Progress Notes (Signed)
Pharmacy Antibiotic Note  [name] is a 76 y.o. male admitted to [facility] on 09/27/2017  with wound infection. He lost IV access and was switched to po doxycycline 09/28/2017, but cellulitis worsened and IV antibiotics vancomycin and zosyn were resumed. Patient transferred to [facility] on 10/05/2017. MRI of foot pending due to concern for osteomyelitis.   Plan: Continue Vancomycin 1g IV q24h for now. Check Vancomycin trough level prior to next dose this PM.   Continue Zosyn 3.375g IV q8h (infuse over 4 hours). Monitor renal function, cultures, clinical course.    Height: 5\' 8"  (172.7 cm) Weight: 268 lb 8.3 oz (121.8 kg) IBW/kg (Calculated) : 68.4  Temp (24hrs), Avg:97.3 F (36.3 C), Min:96.1 F (35.6 C), Max:99.3 F (37.4 C)  Recent Labs  Lab 10/02/17 1949  10/04/17 0451 10/04/17 1837 10/05/17 0624 10/05/17 2213 10/06/17 0445  WBC  --    < > 19.2* 18.3* 18.4* 11.6* 10.9*  CREATININE  --    < > 2.08* 1.91* 2.00* 1.99* 1.97*  LATICACIDVEN  --   --   --   --   --   --  0.6  VANCOTROUGH 16  --   --   --   --   --   --    < > = values in this interval not displayed.    Obesity/Normalized CrCl Vancomycin dosing protocol will be initiated with an estimated normalized CrCl = 30 ml/min.   Estimated Creatinine Clearance: 39.9 mL/min (A) (by C-G formula based on SCr of 1.97 mg/dL (H)).    Allergies  Allergen Reactions  . Allegra [Fexofenadine]   . Biaxin [Clarithromycin]   . Crestor [Rosuvastatin Calcium]   . Glucophage [Metformin Hcl]   . Trimethoprim   . Codeine Other (See Comments)    blisters, but able to take hydrocodone   Antimicrobials this admission: Vanc 11/12 >> 11/13, 11/14 >> Cefepime 11/12 >> 11/13 Zosyn x 1 11/12, 11/14 >> Doxycycline 11/13 >>11/14  Microbiology results: 11/12 BCx: NGF 11/19 MRSA PCR: (-)  Thank you for allowing pharmacy to be a part of this patient's care.   12/19, PharmD, BCPS Pager: [phone] 10/06/2017 1:11 PM

## 2017-10-06 NOTE — Op Note (Signed)
[facility] Patient Name: [name] Procedure Date: 10/06/2017 MRN: [id number] Attending MD: Kerin Salen , MD Date of Birth: Nov 14, 1941 CSN: [id number] Age: 76 Admit Type: Inpatient Procedure:                Upper GI endoscopy Indications:              Hematemesis Providers:                Kerin Salen, MD, Janae Sauce. Steele Berg, RN, Arlee Muslim, Technician Referring MD:              Medicines:                Patient was intubated and on propofol drip pre                            procedure and during the procedure. Complications:            No immediate complications. Estimated Blood Loss:     Estimated blood loss: none. Procedure:                Pre-Anesthesia Assessment:                           - Prior to the procedure, a History and Physical                            was performed, and patient medications and                            allergies were reviewed. The patient's tolerance of                            previous anesthesia was also reviewed. The risks                            and benefits of the procedure and the sedation                            options and risks were discussed with the patient.                            All questions were answered, and informed consent                            was obtained. Prior Anticoagulants: The patient has                            taken Plavix (clopidogrel), last dose was 2 days                            prior to procedure. ASA Grade Assessment: III - A  patient with severe systemic disease. After                            reviewing the risks and benefits, the patient was                            deemed in satisfactory condition to undergo the                            procedure.                           After obtaining informed consent, the endoscope was                            passed under direct vision. Throughout the        procedure, the patient's blood pressure, pulse, and                            oxygen saturations were monitored continuously. The                            EG-[id number] ([id number]) scope was introduced through the                            mouth, and advanced to the second part of duodenum.                            The upper GI endoscopy was accomplished without                            difficulty. The patient tolerated the procedure                            well. Scope In: Scope Out: Findings:      The examined esophagus was normal.The mucosa was covered with dark blood.      Clotted blood was found in the cardia, in the gastric fundus and in the       gastric body. Attempts at removing the clot with a basket was not       successful. Evaluation of underlying gastric mucosa could not be       performed on forward view or retroflexion.      Red blood was found at the incisura, in the gastric antrum, in the       prepyloric region of the stomach and at the pylorus.      Red blood was found in the duodenal bulb and in the first portion of the       duodenum.      On lavage, there was no obvious oozing or bleeding noted from duodenum       or antrum.      However, gastric mucosa in the body, cardia and fundus could not be       evaluated. Impression:               - Normal esophagus.                           -  Clotted blood in the cardia, in the gastric                            fundus and in the gastric body.                           - Red blood in the incisura, in the gastric antrum,                            in the prepyloric region of the stomach and in the                            pylorus.                           - Blood in the duodenal bulb.                           - No specimens collected. Moderate Sedation:      Patient was on IV propofol pre and during the procedure as he was       intubated. Recommendation:           - NPO.                           - Use  erythromycin 500 mg IV q 6 hr today.                           - Use metoclopramide (Reglan) 10 mg IV QID today.                           - Repeat upper endoscopy tomorrow to assess disease                            activity.                           - Monitor H and H and transfuse if Hb is less than                            7 gm/dl.                           - Continue Protonix drip at 8 mg/hr IV.                           - Continue present medications. Procedure Code(s):        --- Professional ---                           [phone], Esophagogastroduodenoscopy, flexible,                            transoral; diagnostic, including collection of  specimen(s) by brushing or washing, when performed                            (separate procedure) Diagnosis Code(s):        --- Professional ---                           K92.2, Gastrointestinal hemorrhage, unspecified                           K92.0, Hematemesis CPT copyright 2016 [employer]. All rights reserved. The codes documented in this report are preliminary and upon coder review may  be revised to meet current compliance requirements. Kerin Salen, MD 10/06/2017 4:06:16 PM This report has been signed electronically. Number of Addenda: 0

## 2017-10-06 NOTE — H&P (View-Only) (Signed)
Subjective:   HPI  The patient is a 76 year old male with multiple medical problems including diabetes mellitus coronary artery disease and atrial fibrillation. There is also a history of ischemic cardiomyopathy he has an ICD. He was being treated for a left foot cellulitis at [facility] when he developed upper GI bleeding. He was on Plavix and Coumadin which have been stopped, and his INR has been corrected. An attempt at EGD was done yesterday by Dr. Oneida Alar but the patient stopped breathing during the anesthesia and the endoscope was never passed and the procedure was aborted. He was transferred to [facility] for further management. He is currently intubated, on a propofol drip, on PPI therapy. An NG tube is in place and draining some darkish colored blood.  The patient had a normal colonoscopy a little over 5 years ago.  Review of Systems Unobtainable  Past Medical History:  Diagnosis Date  . AICD (automatic cardioverter/defibrillator) present   . Anemia   . Anemia-chronic    a. Pt reports h/o anemia - was offered IV iron in past by nephrologist but declined and has taken PO instead.  . Aneurysm, cerebral   . Arthritis    bilateral wrist and fingers  . BPH (benign prostatic hypertrophy)   . Cardiomyopathy, ischemic    LVEF 25-35%.  . Chronic kidney disease, stage III (moderate) (HCC)    Followed by Dr. Hinda Lenis.  . Coronary atherosclerosis of native coronary artery    a. DES to RCA 03/2009. b. s/p PTCA to RCA for ISR, 05/2010. c. 03/2013 NSTEMI 2/2 severe prox RCA s/p PTCA/DES, med rx for residual dz.  . Essential hypertension, benign   . Hyperlipidemia   . Hypothyroidism   . LBBB (left bundle branch block)    s/p BiV ICD Implanted by Dr Caryl Comes  . Morbid obesity ([place])   . MRSA (methicillin resistant Staphylococcus aureus)   . Myocardial infarction ([place])   . Paroxysmal atrial fibrillation ([place])    a. Coumadin discontinue in 2010 when the patient required ASA/Plavix  for stenting. b. Coumadin restarted 05/2013, Plavix continued, ASA stopped.   . Pneumonia    years ago 21  . Pulmonary nodule    CXR, 03/2013, [facility] - not described on any subsequent chest x-rays however, could be a vascular structure  . TIA (transient ischemic attack)    Multiple TIAs  . Type 2 diabetes mellitus ([place])    Past Surgical History:  Procedure Laterality Date  . BIV ICD GENERATOR CHANGEOUT N/A 03/18/2017   Medtronic [place] MRI conditional CRT D device implated by Dr Rayann Heman  . CHOLECYSTECTOMY    . ESOPHAGOGASTRODUODENOSCOPY (EGD) WITH PROPOFOL N/A 10/05/2017   Procedure: ESOPHAGOGASTRODUODENOSCOPY (EGD) WITH PROPOFOL;  Surgeon: Danie Binder, MD;  Location: [facility];  Service: Endoscopy;  Laterality: N/A;  has ICD, just FYI  . ICD placement  05/06/2011   Medtronic CRT-D  . LEFT HEART CATHETERIZATION WITH CORONARY ANGIOGRAM N/A 04/17/2013   Procedure: LEFT HEART CATHETERIZATION WITH CORONARY ANGIOGRAM;  Surgeon: Thayer Headings, MD;  Location: [facility] CATH LAB;  Service: Cardiovascular;  Laterality: N/A;  . LUNG SURGERY     23 - 24 yrs. old  . TOTAL KNEE ARTHROPLASTY    . VASECTOMY     Social History   Socioeconomic History  . Marital status: Married    Spouse name: Not on file  . Number of children: Not on file  . Years of education: Not on file  . Highest education level:  Not on file  Social Needs  . Financial resource strain: Not on file  . Food insecurity - worry: Not on file  . Food insecurity - inability: Not on file  . Transportation needs - medical: Not on file  . Transportation needs - non-medical: Not on file  Occupational History  . Not on file  Tobacco Use  . Smoking status: Former Smoker    Packs/day: 1.00    Years: 6.00    Pack years: 6.00    Types: Cigarettes    Start date: 12/08/1949    Last attempt to quit: 11/16/1964    Years since quitting: 52.9  . Smokeless tobacco: Former User    Quit date: 04/15/1965  Substance and Sexual Activity  .  Alcohol use: Yes    Alcohol/week: 0.0 oz    Comment: once/month  . Drug use: No  . Sexual activity: Not on file  Other Topics Concern  . Not on file  Social History Narrative  . Not on file   family history includes Cancer in his brother, father, and sister; Diabetes in his father and mother; Heart disease in his mother and other; Hypertension in his mother and sister; Liver disease in his mother.  Current Facility-Administered Medications:  .  allopurinol (ZYLOPRIM) tablet 100 mg, 100 mg, Oral, Daily, Tat, David, MD, Stopped at 10/06/17 0948 .  carvedilol (COREG) tablet 12.5 mg, 12.5 mg, Oral, BID WC, Thomas, Tijo, DO, 12.5 mg at 10/05/17 0818 .  chlorhexidine gluconate (MEDLINE KIT) (PERIDEX) 0.12 % solution 15 mL, 15 mL, Mouth Rinse, BID, Hawkins, Edward, MD, 15 mL at 10/06/17 0750 .  Chlorhexidine Gluconate Cloth 2 % PADS 6 each, 6 each, Topical, Daily, Gray, Walter J, MD .  fentaNYL (SUBLIMAZE) injection 50 mcg, 50 mcg, Intravenous, Q2H PRN, Hawkins, Edward, MD, 50 mcg at 10/05/17 1642 .  hydrALAZINE (APRESOLINE) tablet 25 mg, 25 mg, Oral, TID, Thomas, Tijo, DO, Stopped at 10/05/17 1053 .  hydrOXYzine (ATARAX/VISTARIL) tablet 10 mg, 10 mg, Oral, TID PRN, Johnson, Clanford L, MD, 10 mg at 10/01/17 1213 .  insulin aspart (novoLOG) injection 0-9 Units, 0-9 Units, Subcutaneous, Q4H, Johnson, Clanford L, MD, 1 Units at 10/06/17 0748 .  insulin glargine (LANTUS) injection 30 Units, 30 Units, Subcutaneous, Daily, Johnson, Clanford L, MD, 30 Units at 10/05/17 1009 .  isosorbide mononitrate (IMDUR) 24 hr tablet 30 mg, 30 mg, Oral, Daily, Thomas, Tijo, DO, Stopped at 10/05/17 1053 .  levothyroxine (SYNTHROID, LEVOTHROID) tablet 50 mcg, 50 mcg, Oral, QAC breakfast, Tat, David, MD, 50 mcg at 10/06/17 0747 .  loratadine (CLARITIN) tablet 10 mg, 10 mg, Oral, Daily, Hernandez Acosta, Estela Y, MD, 10 mg at 10/05/17 1008 .  MEDLINE mouth rinse, 15 mL, Mouth Rinse, QID, Hawkins, Edward, MD, 15 mL at  10/06/17 0457 .  norepinephrine (LEVOPHED) 4 mg in dextrose 5 % 250 mL (0.016 mg/mL) infusion, 0-40 mcg/min, Intravenous, Titrated, McQuaid, Douglas B, MD .  pantoprazole (PROTONIX) 80 mg in sodium chloride 0.9 % 250 mL (0.32 mg/mL) infusion, 8 mg/hr, Intravenous, Continuous, Smith, Rondell A, MD, Last Rate: 25 mL/hr at 10/06/17 0747, 8 mg/hr at 10/06/17 0747 .  [START ON 10/07/2017] pantoprazole (PROTONIX) injection 40 mg, 40 mg, Intravenous, Q12H, Smith, Rondell A, MD .  piperacillin-tazobactam (ZOSYN) IVPB 3.375 g, 3.375 g, Intravenous, Q8H, Hernandez Acosta, Estela Y, MD, Stopped at 10/06/17 0733 .  propofol (DIPRIVAN) 1000 MG/100ML infusion, 0-50 mcg/kg/min, Intravenous, Continuous, Hawkins, Edward, MD, Last Rate: 14.5 mL/hr at 10/06/17 0747, 20 mcg/kg/min at 10/06/17   0747 .  sodium chloride flush (NS) 0.9 % injection 10-40 mL, 10-40 mL, Intracatheter, Q12H, Gray, Walter J, MD, 10 mL at 10/05/17 2104 .  sodium chloride flush (NS) 0.9 % injection 10-40 mL, 10-40 mL, Intracatheter, PRN, Gray, Walter J, MD .  tamsulosin (FLOMAX) capsule 0.4 mg, 0.4 mg, Oral, QPC supper, Tat, David, MD, 0.4 mg at 10/04/17 1632 .  vancomycin (VANCOCIN) IVPB 1000 mg/200 mL premix, 1,000 mg, Intravenous, Q24H, Johnson, Clanford L, MD, Stopped at 10/05/17 2205 .  Warfarin - Pharmacist Dosing Inpatient, , Does not apply, Q24H, Tat, David, MD Allergies  Allergen Reactions  . Allegra [Fexofenadine]   . Biaxin [Clarithromycin]   . Crestor [Rosuvastatin Calcium]   . Glucophage [Metformin Hcl]   . Trimethoprim   . Codeine Other (See Comments)    blisters, but able to take hydrocodone     Objective:     BP (!) 147/50   Pulse 63   Temp 97.9 F (36.6 C) (Core)   Resp 16   Ht 5' 8" (1.727 m)   Wt 121.8 kg (268 lb 8.3 oz)   SpO2 98%   BMI 40.83 kg/m   Patient is on a propofol drip and intubated  Heart irregular rhythm  Lungs clear  Abdomen soft and nontender  Laboratory No components found for: D1     Assessment:     Upper GI bleed      Plan:     Continue PPI therapy. Transfuse blood as needed. We will proceed with EGD today. Lab Results  Component Value Date   HGB 7.1 (L) 10/06/2017   HGB 7.3 (L) 10/05/2017   HGB 7.4 (L) 10/05/2017   HCT 21.2 (L) 10/06/2017   HCT 21.5 (L) 10/05/2017   HCT 23.2 (L) 10/05/2017   ALKPHOS 78 10/05/2017   ALKPHOS 85 06/15/2013   ALKPHOS 77 06/12/2013   AST 49 (H) 10/05/2017   AST 25 06/15/2013   AST 27 06/12/2013   ALT 24 10/05/2017   ALT 15 06/15/2013   ALT 15 06/12/2013   

## 2017-10-06 NOTE — Progress Notes (Signed)
Pharmacy Antibiotic Note  [name] is a 76 y.o. male admitted to [facility] on 09/27/2017  with wound infection. He lost IV access and was switched to po doxycycline 09/28/2017, but cellulitis worsened and IV antibiotics vancomycin and zosyn were resumed. Patient transferred to [facility] on 10/05/2017. MRI of foot pending due to concern for osteomyelitis.   Vancomycin trough = 20 mcg/ml (goal 15-20 mcg/mL due to concern for osteomyelitis) SCr relatively stable  Plan: Reduce Vancomycin to 750 mg IV q24h. Although trough level therapeutic, appears patient is accumulating. Continue Zosyn 3.375g IV q8h (infuse over 4 hours). F/u SCr, clinical course.  Height: 5\' 8"  (172.7 cm) Weight: 268 lb 8.3 oz (121.8 kg) IBW/kg (Calculated) : 68.4  Temp (24hrs), Avg:98.5 F (36.9 C), Min:96.1 F (35.6 C), Max:100.2 F (37.9 C)  Recent Labs  Lab 10/02/17 1949  10/04/17 0451 10/04/17 1837 10/05/17 0624 10/05/17 2213 10/06/17 0445 10/06/17 2005  WBC  --    < > 19.2* 18.3* 18.4* 11.6* 10.9*  --   CREATININE  --    < > 2.08* 1.91* 2.00* 1.99* 1.97*  --   LATICACIDVEN  --   --   --   --   --   --  0.6  --   VANCOTROUGH 16  --   --   --   --   --   --  20   < > = values in this interval not displayed.    Obesity/Normalized CrCl Vancomycin dosing protocol will be initiated with an estimated normalized CrCl = 30 ml/min.   Estimated Creatinine Clearance: 39.9 mL/min (A) (by C-G formula based on SCr of 1.97 mg/dL (H)).    Allergies  Allergen Reactions  . Allegra [Fexofenadine]   . Biaxin [Clarithromycin]   . Crestor [Rosuvastatin Calcium]   . Glucophage [Metformin Hcl]   . Trimethoprim   . Codeine Other (See Comments)    blisters, but able to take hydrocodone   Antimicrobials this admission: Vanc 11/12 >> 11/13, 11/14 >> Cefepime 11/12 >> 11/13 Zosyn x 1 11/12, 11/14>> Doxy 11/13 >>11/14  Dose adjustments this admission: 11/17 1949 VT: 16 mcg/mL-dose adj from 1250mg  q24h to 1g  q24h 11/21 VT: 20 mcg/ml on 1g q24h dosing  Microbiology results: 11/12 BCx: NGF 11/19 MRSA PCR: (-)  Thank you for allowing pharmacy to be a part of this patient's care.   13/12, PharmD, BCPS Pager: [phone] 10/06/2017 9:16 PM

## 2017-10-06 NOTE — Progress Notes (Signed)
PT Cancellation Note  Patient Details Name: [name] MRN: [id number] DOB: [birth year]   Cancelled Treatment:     PT deferred this date - RN advises pt on vent since EGD.  Will follow.   Anyia Gierke 10/06/2017, 10:49 AM

## 2017-10-06 NOTE — Progress Notes (Signed)
PULMONARY / CRITICAL CARE MEDICINE   Name: [name] MRN: [id number] DOB: [birth year]    ADMISSION DATE:  09/27/2017   CHIEF COMPLAINT:  Left foot cellulitis and New GI Bleed   HISTORY OF PRESENT ILLNESS:  76 y.o. male with medical history of diabetes mellitus, coronary artery disease on plavix, paroxysmal atrial fibrillation on coumadin, CKD stage III, rheumatoid arthritis, ischemic cardiomyopathy status post ICD presented on 11/12 with 3-day history of increasing pain, edema, erythema about his left foot. He stepped on a nail that 2 weeks prior to this admission. The patient was soaking his foot in Epsom salts over the period of 2 weeks.   He visited emergency department on September 24, 2017.  X-rays were negative for any erosions.  The patient was sent home with ciprofloxacin and clindamycin.  However, over the past 2-3 days he has noted increasing edema, pain, and erythema and returned to the emergency department.   Patient was started on broad spectrum abx and switched to oral doxycycline but his cellulitis got worse and was restarted on IV abx. During this admission patient also developed dark bloody bowel movements with subsequent hypotension (11/16). He was given multiple units of blood and FFP for coumadin reversal.   Patient was planned for EGD on 11/20. He was given sedatives which caused patient to become hypoxic and cyanotic. He was subsequently intubated. Patient was also evaluated by surgery for his foot who recommended patient be transferred to [facility] for vascular surgery evaluation for possible intervention due to occlusive vascular disease being found in workup.    SUBJECTIVE:  RN reports ongoing small volume dark blood from NGT.  Pt remains sedate on vent.  No acute events overnight.   VITAL SIGNS: BP (!) 147/50   Pulse 63   Temp 97.9 F (36.6 C) (Core)   Resp 16   Ht 5\' 8"  (1.727 m)   Wt 268 lb 8.3 oz (121.8 kg)   SpO2 98%   BMI 40.83 kg/m   HEMODYNAMICS:     VENTILATOR SETTINGS: Vent Mode: PRVC FiO2 (%):  [40 %-100 %] 40 % Set Rate:  [14 bmp-16 bmp] 16 bmp Vt Set:  [550 mL] 550 mL PEEP:  [5 cmH20] 5 cmH20 Plateau Pressure:  [15 cmH20-20 cmH20] 20 cmH20  INTAKE / OUTPUT: I/O last 3 completed shifts: In: 2119.7 [I.V.:1254.7; Blood:315; IV Piggyback:550] Out: 1376 [Urine:1375; Stool:1]  PHYSICAL EXAMINATION: General: ill appearing adult male on vent HEENT: MM pink/moist, ETT Neuro: sedate on propofol, raises eyebrows to voice  CV: s1s2 rrr, no m/r/g PULM: even/non-labored, lungs bilaterally clear  09-02-1973, non-tender, bsx4 active  Extremities: warm/dry, 1+ BLE edema, LLE foot with wound on plantar foot /dark eschar in appearance  Skin: no rashes or lesions   LABS:  BMET Recent Labs  Lab 10/05/17 0624 10/05/17 2213 10/06/17 0445  NA 139 141 141  K 4.9 4.4 4.2  CL 111 115* 116*  CO2 22 22 23   BUN 42* 44* 44*  CREATININE 2.00* 1.99* 1.97*  GLUCOSE 250* 205* 163*    Electrolytes Recent Labs  Lab 10/05/17 0624 10/05/17 2213 10/06/17 0445  CALCIUM 8.4* 8.5* 8.3*  MG  --  2.2  --   PHOS  --  2.8  --     CBC Recent Labs  Lab 10/05/17 0624 10/05/17 2213 10/06/17 0445  WBC 18.4* 11.6* 10.9*  HGB 7.4* 7.3* 7.1*  HCT 23.2* 21.5* 21.2*  PLT 291 226 225    Coag's Recent Labs  Lab  10/04/17 0451 10/05/17 0624 10/05/17 2213  APTT  --   --  44*  INR 4.52* 1.35 1.38    Sepsis Markers Recent Labs  Lab 10/06/17 0445  LATICACIDVEN 0.6    ABG Recent Labs  Lab 10/05/17 1543  PHART 7.332*  PCO2ART 40.7  PO2ART 299*    Liver Enzymes Recent Labs  Lab 10/05/17 2213  AST 49*  ALT 24  ALKPHOS 78  BILITOT 0.7  ALBUMIN 2.0*    Cardiac Enzymes No results for input(s): TROPONINI, PROBNP in the last 168 hours.  Glucose Recent Labs  Lab 10/05/17 1548 10/05/17 1834 10/05/17 2006 10/06/17 0006 10/06/17 0305 10/06/17 0713  GLUCAP 185* 175* 171* 162* 154* 143*    Imaging Portable Chest  Xray  Result Date: 10/06/2017 CLINICAL DATA:  Respiratory failure/hypoxia EXAM: PORTABLE CHEST 1 VIEW COMPARISON:  October 05, 2017 FINDINGS: Endotracheal tube tip is 4.3 cm above the carina. Central catheter tip is at the cavoatrial junction. Nasogastric tube tip and side port are in the stomach. Pacemaker leads are unchanged. No pneumothorax evident. There are bilateral pleural effusions with patchy airspace consolidation in the bases. There is cardiomegaly with pulmonary vascularity within normal limits. No adenopathy. No bone lesions. IMPRESSION: Tube and catheter positions as described without pneumothorax. Small pleural effusions noted bilaterally. Airspace opacity in the bases is likely primarily due to atelectasis, although pneumonia in the bases superimposed cannot be excluded. Cardiomegaly is stable. Electronically Signed   By: Bretta Bang III M.D.   On: 10/06/2017 07:36   Dg Chest Port 1 View  Result Date: 10/05/2017 CLINICAL DATA:  76 year old male intubated.  Admitted with GI bleed. EXAM: PORTABLE CHEST 1 VIEW COMPARISON:  10/04/2017 and earlier. FINDINGS: Portable AP upright view at 1438 hours. Endotracheal tube tip in good position between the clavicles and carina. Stable left chest cardiac AICD. Stable right PICC line. Enteric tube courses to the abdomen and loops in the left upper quadrant. Increased retrocardiac opacity obscuring the medial aspect of the left hemidiaphragm. Mildly lower lung volumes. Stable cardiac size and mediastinal contours. No pneumothorax, pulmonary edema, or definite pleural effusion. IMPRESSION: 1. Endotracheal tube in good position. Enteric tube looped in the stomach. 2. Increased lower lobe collapse or consolidation since yesterday. Mildly lower lung volumes. Electronically Signed   By: Odessa Fleming M.D.   On: 10/05/2017 14:55     STUDIES:  ABI 11/19 >> R ABI in the moderate range of arterial occlusive disease, L non-obtainable ABI with proximal occlusive  disease  CULTURES: MRSA 11/19 >> negative  BCx2 11/12 >> negative   ANTIBIOTICS: Vancomycin 11/12 >> Zosyn 11/12 >>   SIGNIFICANT EVENTS: 11/12  Admit to [facility]  11/20  Transfer to [facility] after decompensation during EGD, intubation   LINES/TUBES: PICC RUE 11/14 >>   DISCUSSION: 76 year old male with many cormorbidites admitted with left LLE cellulitis, developed GIB and decompensated during EGD requiring intubation.  Tx to [facility] on 11/21 for VVS & GI evaluation.   ASSESSMENT / PLAN:  PULMONARY A:  Acute Hypoxic Respiratory Failure - decompensated in setting of GIB/anemia and sedation Pulmonary Nodule   Former Smoker - remote, quit in 1966 P:   PRVC 8 cc/kg  Wean PEEP / FiO2 for sats > 92% Aspiration precautions  Monitor CXR for development of airspace disease given vomiting  Hold SBT until EGD completed   CARDIOVASCULAR A:  Hypotension secondary to hemorraghic shock vs septic shock (doubt septic) PVD  Hx of paroxysmal a fib, CAD, ischemic  cardiomyopathy s/p ICD, LBBB P:  ICU monitoring  HOLD home coumadin / plavix given GIB  Will need antiplatelet in the future given high CHADSVAC > await EGD results  VVS evaluation > likely will need transfer to [facility] for intervention PRN Hydralazine for SBP > 170  RENAL A:   CKD III P:   Trend BMP / urinary output Replace electrolytes as indicated Avoid nephrotoxic agents, ensure adequate renal perfusion  GASTROINTESTINAL A:   Acute GI bleed in setting of coumadin and Plavix  P:   EGD pending 11/21  PPI gtt  NPO, hold meds per NGT for now  See Heme  HEMATOLOGIC A:   Acute Blood anemia secondary to Upper GI Bleed  Supratheraputic  INR secondary to coumadin s/p FFP P:  Trend CBC  Follow PT/INR  1 unit PRBC now Hold home coumadin / plavix   INFECTIOUS A:   LLE Wound   P:   Continue broad spectrum abx for now  May need MRI to ensure to osteo of the foot before stopping abx  ENDOCRINE A:   DM II Hypothyroidism P:    SSI  Lantus 30 units QD Continue synthroid   NEUROLOGIC A:   No acute issues  P:   RASS goal: 0 to -1  WUA / SBT once EGD completed    CC Time:  30 minutes   Canary Brim, NP-C [facility] Pgr: [phone] or if no answer [phone] 10/06/2017, 9:11 AM

## 2017-10-06 NOTE — Care Management Note (Signed)
Case Management Note  Patient Details  Name: [name] MRN: [id number] Date of Birth: 1941-07-14  Subjective/Objective:                  intubated Action/Plan: Date: October 06, 2017 Marcelle Smiling, BSN, [place], [place]  [phone] Chart and notes review for patient progress and needs. Will follow for case management and discharge needs. Next review date: [id number]  Expected Discharge Date:                  Expected Discharge Plan:     In-House Referral:     Discharge planning Services  CM Consult  Post Acute Care Choice:    Choice offered to:     DME Arranged:    DME Agency:     HH Arranged:    HH Agency:     Status of Service:  In process, will continue to follow  If discussed at Long Length of Stay Meetings, dates discussed:    Additional Comments:  Golda Acre, RN 10/06/2017, 8:34 AM

## 2017-10-06 NOTE — Op Note (Signed)
EGD was performed at bedside in ICU. Patient was already intubated and sedated prior to beginning of procedure. OG tube was removed prior to insertion of scope.  As the scope was passed into the esophagus, blood was noted overlying esophageal mucosa. As the scope entered the gastric cavity, a large blood clot was noted covering most of the gastric body. On retroflexion the cardia and fundus could not be evaluated. Mucosa of the antrum, incisura,pylorus and pyloric channel was covered with bright red blood. Mucosa of the duodenal bulb, first and second portion of the duodenum was also covered with bright red blood. Upon lavage no active bleeding was noted from the antrum, duodenal bulb or duodenum. Mucosa of the cardia and fundus and the gastric body could not be evaluated due to the large clot. A basket was used in an attempt to remove the clot, this was not successful, as the clot was almost 10 times larger than the size of the basket.  The scope was withdrawn and the procedure was terminated. OG tube was reinserted and drained dark bloody fluid.  Recommendation Keep patient nothing by mouth. Continue Protonix drip at 8 mg per hour IV. Reglan 10 mg IV every 6 hours for 4 doses. Erythromycin 500 mg IV every 8 hours 3 doses. Reattempt endoscopy in a.m., hopefully with Reglan and erythromycin the clot in the gastric cavity will have migrated enough to help visualize underlying gastric mucosa and identify site of upper GI bleed.  Kerin Salen, M.D.

## 2017-10-06 NOTE — Progress Notes (Signed)
[facility]  Evening rounds  EGD: large clot Plan reglan overnight Repeat EGD in AM  Maintain full vent support overnight  Heber Whiteface, MD [place] [facility] Pager: [phone] Cell: [phone] After 3pm or if no response, call [phone]

## 2017-10-06 NOTE — Brief Op Note (Signed)
09/27/2017 - 10/06/2017  4:06 PM  PATIENT:  [name]  76 y.o. male  PRE-OPERATIVE DIAGNOSIS:  ugi bleed  POST-OPERATIVE DIAGNOSIS:  gastric clot unable to break up  PROCEDURE:  Procedure(s): ESOPHAGOGASTRODUODENOSCOPY (EGD) (N/A)  SURGEON:  Surgeon(s) and Role:    Kerin Salen, MD - Primary  PHYSICIAN ASSISTANT:   ASSISTANTS: Janae Sauce.Steele Berg, RN, Arlee Muslim, Tech   ANESTHESIA:  propofol  EBL:  None   BLOOD ADMINISTERED:none  DRAINS: none   LOCAL MEDICATIONS USED:  NONE  SPECIMEN:  No Specimen  DISPOSITION OF SPECIMEN:  N/A  COUNTS:  YES  TOURNIQUET:  * No tourniquets in log *  DICTATION: .Dragon Dictation  PLAN OF CARE: Admit to inpatient   PATIENT DISPOSITION:  ICU- hemodynamically stable   Delay start of Pharmacological VTE agent (>24hrs) due to surgical blood loss or risk of bleeding: yes

## 2017-10-06 NOTE — Progress Notes (Signed)
MRI called inquiring about ICD BIV, whether the type was Sure Scan and safe for MRI. Wife gave pocket card and MRI called about type. MRI asked RN to call and verify with Cardiology. Copy made of pocket card and Cardiology master called to find out if all leads had replaced and if patient is safe to scan. Pager number for PA Renee given by cards master, MD paged.

## 2017-10-06 NOTE — Interval H&P Note (Signed)
History and Physical Interval Note: 76/male with hematemesis and anemia, was on Plavix and coumadin for EGD.  10/06/2017 3:34 PM  [name]  has presented today for EGD, with the diagnosis of ugi bleed  The various methods of treatment have been discussed with the patient and family. After consideration of risks, benefits and other options for treatment, the patient has consented to  Procedure(s): ESOPHAGOGASTRODUODENOSCOPY (EGD) (N/A) as a surgical intervention .  The patient's history has been reviewed, patient examined, no change in status, stable for surgery.  I have reviewed the patient's chart and labs.  Questions were answered to the patient's satisfaction.     Kerin Salen

## 2017-10-07 ENCOUNTER — Inpatient Hospital Stay (HOSPITAL_COMMUNITY): Payer: PPO

## 2017-10-07 ENCOUNTER — Encounter (HOSPITAL_COMMUNITY)
Admission: EM | Disposition: A | Discharge status: Skilled Nursing Facility | Payer: Self-pay | Source: Home / Self Care | Attending: Pulmonary Disease

## 2017-10-07 ENCOUNTER — Encounter (HOSPITAL_COMMUNITY): Payer: Self-pay | Admitting: *Deleted

## 2017-10-07 DIAGNOSIS — J9600 Acute respiratory failure, unspecified whether with hypoxia or hypercapnia: Secondary | ICD-10-CM

## 2017-10-07 DIAGNOSIS — J9690 Respiratory failure, unspecified, unspecified whether with hypoxia or hypercapnia: Secondary | ICD-10-CM

## 2017-10-07 HISTORY — PX: ESOPHAGOGASTRODUODENOSCOPY: SHX5428

## 2017-10-07 LAB — CBC
HCT: 26 % — ABNORMAL LOW (ref 39.0–52.0)
HEMATOCRIT: 24.5 % — AB (ref 39.0–52.0)
HEMATOCRIT: 26.1 % — AB (ref 39.0–52.0)
HEMOGLOBIN: 8.2 g/dL — AB (ref 13.0–17.0)
HEMOGLOBIN: 8.5 g/dL — AB (ref 13.0–17.0)
HEMOGLOBIN: 8.5 g/dL — AB (ref 13.0–17.0)
MCH: 31.9 pg (ref 26.0–34.0)
MCH: 31.4 pg (ref 26.0–34.0)
MCH: 31.1 pg (ref 26.0–34.0)
MCHC: 33.5 g/dL (ref 30.0–36.0)
MCHC: 32.7 g/dL (ref 30.0–36.0)
MCHC: 32.6 g/dL (ref 30.0–36.0)
MCV: 95.3 fL (ref 78.0–100.0)
MCV: 95.9 fL (ref 78.0–100.0)
MCV: 95.6 fL (ref 78.0–100.0)
PLATELETS: 319 10*3/uL (ref 150–400)
Platelets: 255 10*3/uL (ref 150–400)
Platelets: 339 10*3/uL (ref 150–400)
RBC: 2.57 MIL/uL — AB (ref 4.22–5.81)
RBC: 2.71 MIL/uL — ABNORMAL LOW (ref 4.22–5.81)
RBC: 2.73 MIL/uL — AB (ref 4.22–5.81)
RDW: 16.3 % — ABNORMAL HIGH (ref 11.5–15.5)
RDW: 16.2 % — AB (ref 11.5–15.5)
RDW: 15.9 % — ABNORMAL HIGH (ref 11.5–15.5)
WBC: 10.7 10*3/uL — ABNORMAL HIGH (ref 4.0–10.5)
WBC: 12.7 10*3/uL — ABNORMAL HIGH (ref 4.0–10.5)
WBC: 11.1 10*3/uL — AB (ref 4.0–10.5)

## 2017-10-07 LAB — TYPE AND SCREEN
ABO/RH(D): A POS
ABO/RH(D): A POS
ANTIBODY SCREEN: NEGATIVE
Antibody Screen: NEGATIVE
UNIT DIVISION: 0
UNIT DIVISION: 0
UNIT DIVISION: 0
Unit division: 0

## 2017-10-07 LAB — COMPREHENSIVE METABOLIC PANEL
ALBUMIN: 2 g/dL — AB (ref 3.5–5.0)
ALK PHOS: 81 U/L (ref 38–126)
ALT: 20 U/L (ref 17–63)
AST: 32 U/L (ref 15–41)
Anion gap: 5 (ref 5–15)
BILIRUBIN TOTAL: 0.6 mg/dL (ref 0.3–1.2)
BUN: 41 mg/dL — ABNORMAL HIGH (ref 6–20)
CALCIUM: 8.3 mg/dL — AB (ref 8.9–10.3)
CO2: 22 mmol/L (ref 22–32)
CREATININE: 2.01 mg/dL — AB (ref 0.61–1.24)
Chloride: 113 mmol/L — ABNORMAL HIGH (ref 101–111)
GFR calc Af Amer: 35 mL/min — ABNORMAL LOW (ref >60)
GFR calc non Af Amer: 30 mL/min — ABNORMAL LOW (ref >60)
GLUCOSE: 137 mg/dL — AB (ref 65–99)
Potassium: 4.2 mmol/L (ref 3.5–5.1)
SODIUM: 140 mmol/L (ref 135–145)
TOTAL PROTEIN: 5.5 g/dL — AB (ref 6.5–8.1)

## 2017-10-07 LAB — GLUCOSE, CAPILLARY
GLUCOSE-CAPILLARY: 135 mg/dL — AB (ref 65–99)
GLUCOSE-CAPILLARY: 120 mg/dL — AB (ref 65–99)
Glucose-Capillary: 118 mg/dL — ABNORMAL HIGH (ref 65–99)
Glucose-Capillary: 139 mg/dL — ABNORMAL HIGH (ref 65–99)
Glucose-Capillary: 128 mg/dL — ABNORMAL HIGH (ref 65–99)

## 2017-10-07 LAB — BPAM RBC
BLOOD PRODUCT EXPIRATION DATE: 201812072359
Blood Product Expiration Date: 201811262359
Blood Product Expiration Date: 201811302359
Blood Product Expiration Date: 201812122359
ISSUE DATE / TIME: 201811190531
ISSUE DATE / TIME: 201811190621
ISSUE DATE / TIME: 201811200920
ISSUE DATE / TIME: 201811211218
UNIT TYPE AND RH: 6200
UNIT TYPE AND RH: 6200
UNIT TYPE AND RH: 6200
Unit Type and Rh: 9500

## 2017-10-07 SURGERY — EGD (ESOPHAGOGASTRODUODENOSCOPY)
Anesthesia: Moderate Sedation

## 2017-10-07 MED ORDER — FENTANYL CITRATE (PF) 100 MCG/2ML IJ SOLN
INTRAMUSCULAR | Status: AC
  Filled 2017-10-07: qty 2

## 2017-10-07 MED ORDER — CHLORHEXIDINE GLUCONATE CLOTH 2 % EX PADS
6.0000 | MEDICATED_PAD | Freq: Every day | CUTANEOUS | Status: DC
  Administered 2017-10-07 – 2017-10-15 (×8): 6 via TOPICAL

## 2017-10-07 MED ORDER — MIDAZOLAM HCL 5 MG/ML IJ SOLN
INTRAMUSCULAR | Status: AC
  Filled 2017-10-07: qty 2

## 2017-10-07 MED ORDER — EPINEPHRINE PF 1 MG/10ML IJ SOSY
PREFILLED_SYRINGE | INTRAMUSCULAR | Status: AC
  Filled 2017-10-07: qty 10

## 2017-10-07 MED ORDER — SODIUM CHLORIDE 0.9 % IV SOLN
8.0000 mg/h | INTRAVENOUS | Status: AC
  Administered 2017-10-07 – 2017-10-10 (×7): 8 mg/h via INTRAVENOUS
  Filled 2017-10-07 (×11): qty 80

## 2017-10-07 NOTE — Op Note (Signed)
[facility] Patient Name: [name] Procedure Date: 10/07/2017 MRN: [id number] Attending MD: Kathi Der , MD Date of Birth: November 14, 1941 CSN: [id number] Age: 76 Admit Type: Inpatient Procedure:                Upper GI endoscopy Indications:              Suspected upper gastrointestinal bleeding Providers:                Kathi Der, MD, Roselie Awkward, RN, Kandice Robinsons, Technician Referring MD:              Medicines:                None Complications:            No immediate complications. Estimated Blood Loss:     Estimated blood loss: none. Procedure:                Pre-Anesthesia Assessment:                           - Prior to the procedure, a History and Physical                            was performed, and patient medications and                            allergies were reviewed. The patient's tolerance of                            previous anesthesia was also reviewed. The risks                            and benefits of the procedure and the sedation                            options and risks were discussed with the patient.                            All questions were answered, and informed consent                            was obtained. Prior Anticoagulants: The patient has                            taken no previous anticoagulant or antiplatelet                            agents. ASA Grade Assessment: III - A patient with                            severe systemic disease. After reviewing the risks                            and  benefits, the patient was deemed in                            satisfactory condition to undergo the procedure.                           After obtaining informed consent, the endoscope was                            passed under direct vision. Throughout the                            procedure, the patient's blood pressure, pulse, and                            oxygen saturations  were monitored continuously. The                            EG-[id number] ([id number]) scope was introduced through the                            mouth, and advanced to the second part of duodenum.                            The upper GI endoscopy was technically difficult                            and complex due to poor endoscopic visualization.                            The patient tolerated the procedure well. Scope In: Scope Out: Findings:      Normal mucosa was found in the entire esophagus.      Clotted blood was found in the gastric fundus. Large blood clot in the       fundus and proximal body limiting visualization.      Few non-bleeding linear gastric ulcers with no stigmata of bleeding were       found in the gastric body.      The duodenal bulb, first portion of the duodenum and second portion of       the duodenum were normal. Impression:               - Normal mucosa was found in the entire esophagus.                           - Clotted blood in the gastric fundus.                           - Non-bleeding gastric ulcers with no stigmata of                            bleeding.                           - Normal duodenal bulb, first portion of the  duodenum and second portion of the duodenum.                           - No specimens collected. Moderate Sedation:      NONE Recommendation:           - NPO.                           - Continue present medications.                           - Repeat upper endoscopy PRN. Procedure Code(s):        --- Professional ---                           [phone], Esophagogastroduodenoscopy, flexible,                            transoral; diagnostic, including collection of                            specimen(s) by brushing or washing, when performed                            (separate procedure) Diagnosis Code(s):        --- Professional ---                           K92.2, Gastrointestinal hemorrhage, unspecified                            K25.9, Gastric ulcer, unspecified as acute or                            chronic, without hemorrhage or perforation CPT copyright 2016 [employer]. All rights reserved. The codes documented in this report are preliminary and upon coder review may  be revised to meet current compliance requirements. Kathi Der, MD Kathi Der, MD 10/07/2017 8:41:19 AM Number of Addenda: 0

## 2017-10-07 NOTE — Progress Notes (Signed)
[name] is a 76 y.o. male has presented with GI bleed. EGD yesterday showed large blood clot in the stomach limiting visualization.   The various methods of treatment have been discussed with the patient and family. After consideration of risks, benefits and other options for treatment, the patient has consented to  Procedure(s): EGD  as a surgical intervention .  The patient's history has been reviewed, patient examined, no change in status, stable for surgery.  I have reviewed the patient's chart and labs.   patient remains intubated. Consent obtained from wife.  Risks (bleeding, infection, bowel perforation that could require surgery, sedation-related changes in cardiopulmonary systems), benefits (identification and possible treatment of source of symptoms, exclusion of certain causes of symptoms), and alternatives (watchful waiting, radiographic imaging studies, empiric medical treatment)  were explained to family in detail and patient wishes to proceed.  Kathi Der MD, FACP 10/07/2017, 7:43 AM  Contact #  [phone]

## 2017-10-07 NOTE — Plan of Care (Signed)
  Progressing Elimination: Will not experience complications related to bowel motility 10/07/2017 0809 - Progressing by Charleen Kirks, RN Safety: Ability to remain free from injury will improve 10/07/2017 0809 - Progressing by Charleen Kirks, RN Skin Integrity: Risk for impaired skin integrity will decrease 10/07/2017 0809 - Progressing by Charleen Kirks, RN

## 2017-10-07 NOTE — Progress Notes (Signed)
PULMONARY / CRITICAL CARE MEDICINE   Name: [name] MRN: [id number] DOB: 10-07-1941    ADMISSION DATE:  09/27/2017   CHIEF COMPLAINT:  Left foot cellulitis and New GI Bleed   HISTORY OF PRESENT ILLNESS:  76 y.o. male with medical history of diabetes mellitus, coronary artery disease on plavix, paroxysmal atrial fibrillation on coumadin, CKD stage III, rheumatoid arthritis, ischemic cardiomyopathy status post ICD presented on 11/12 with 3-day history of increasing pain, edema, erythema about his left foot. He stepped on a nail that 2 weeks prior to this admission. The patient was soaking his foot in Epsom salts over the period of 2 weeks.   He visited emergency department on September 24, 2017.  X-rays were negative for any erosions.  The patient was sent home with ciprofloxacin and clindamycin.  However, over the past 2-3 days he has noted increasing edema, pain, and erythema and returned to the emergency department.   Patient was started on broad spectrum abx and switched to oral doxycycline but his cellulitis got worse and was restarted on IV abx. During this admission patient also developed dark bloody bowel movements with subsequent hypotension (11/16). He was given multiple units of blood and FFP for coumadin reversal.   Patient was planned for EGD on 11/20. He was given sedatives which caused patient to become hypoxic and cyanotic. He was subsequently intubated. Patient was also evaluated by surgery for his foot who recommended patient be transferred to [facility] for vascular surgery evaluation for possible intervention due to occlusive vascular disease being found in workup.    SUBJECTIVE:   EGD - clot larges on mtach Repeat egd same Remains on vent  VITAL SIGNS: BP (!) 165/74   Pulse 74   Temp 98.6 F (37 C)   Resp 17   Ht 5\' 8"  (1.727 m)   Wt 122.9 kg (270 lb 15.1 oz)   SpO2 98%   BMI 41.20 kg/m   HEMODYNAMICS:    VENTILATOR SETTINGS: Vent Mode: PRVC FiO2 (%):  [30  %-60 %] 30 % Set Rate:  [16 bmp] 16 bmp Vt Set:  [550 mL] 550 mL PEEP:  [5 cmH20] 5 cmH20 Plateau Pressure:  [17 cmH20-23 cmH20] 18 cmH20  INTAKE / OUTPUT: I/O last 3 completed shifts: In: 2841.1 [I.V.:1581.1; Blood:660; IV Piggyback:600] Out: 1320 [Urine:1320]  PHYSICAL EXAMINATION: Tyler: on vent Neuro: rass -1 to -2., FC HEENT: jvd wnl PULM: rt slight reduced, CTA CV:  s1 s2 RRR no r GI: soft, BS wnl, no r , no g Extremities: mild left edema    LABS:  BMET Recent Labs  Lab 10/05/17 2213 10/06/17 0445 10/07/17 0328  NA 141 141 140  K 4.4 4.2 4.2  CL 115* 116* 113*  CO2 22 23 22   BUN 44* 44* 41*  CREATININE 1.99* 1.97* 2.01*  GLUCOSE 205* 163* 137*    Electrolytes Recent Labs  Lab 10/05/17 2213 10/06/17 0445 10/07/17 0328  CALCIUM 8.5* 8.3* 8.3*  MG 2.2  --   --   PHOS 2.8  --   --     CBC Recent Labs  Lab 10/05/17 2213 10/06/17 0445 10/06/17 2005 10/07/17 0328  WBC 11.6* 10.9*  --  10.7*  HGB 7.3* 7.1* 8.3* 8.2*  HCT 21.5* 21.2* 24.8* 24.5*  PLT 226 225  --  255    Coag's Recent Labs  Lab 10/04/17 0451 10/05/17 0624 10/05/17 2213  APTT  --   --  44*  INR 4.52* 1.35 1.38  Sepsis Markers Recent Labs  Lab 10/06/17 0445  LATICACIDVEN 0.6    ABG Recent Labs  Lab 10/05/17 1543  PHART 7.332*  PCO2ART 40.7  PO2ART 299*    Liver Enzymes Recent Labs  Lab 10/05/17 2213 10/07/17 0328  AST 49* 32  ALT 24 20  ALKPHOS 78 81  BILITOT 0.7 0.6  ALBUMIN 2.0* 2.0*    Cardiac Enzymes No results for input(s): TROPONINI, PROBNP in the last 168 hours.  Glucose Recent Labs  Lab 10/06/17 1141 10/06/17 1549 10/06/17 1916 10/06/17 2324 10/07/17 0329 10/07/17 0724  GLUCAP 128* 117* 101* 119* 118* 139*    Imaging Dg Chest Port 1 View  Result Date: 10/07/2017 CLINICAL DATA:  Acute respiratory failure with hypoxia EXAM: PORTABLE CHEST 1 VIEW COMPARISON:  10/06/2017 FINDINGS: Endotracheal tube remains in good position. Right  arm PICC tip in the lower SVC. Transvenous AICD unchanged. Bibasilar atelectasis left greater than right unchanged. Small left effusion unchanged. Mild pulmonary vascular congestion unchanged IMPRESSION: No significant change from yesterday. Pulmonary vascular congestion with bibasilar atelectasis left greater than right and small left effusion. Electronically Signed   By: Marlan Palau M.D.   On: 10/07/2017 07:12   Dg Chest Port 1 View  Result Date: 10/06/2017 CLINICAL DATA:  Check nasogastric catheter placement EXAM: PORTABLE CHEST 1 VIEW COMPARISON:  10/06/2017 FINDINGS: Cardiac shadow remains enlarged. Defibrillator is again seen. Right-sided PICC line and endotracheal tube are noted and stable. Nasogastric catheter is coiled within the stomach. Persistent density in the left base with effusion is noted. No other focal abnormality is seen. IMPRESSION: Nasogastric catheter within the stomach. The overall appearance of the chest is stable from the previous exam. Electronically Signed   By: Alcide Clever M.D.   On: 10/06/2017 16:55   Dg Abd Portable 1v  Result Date: 10/06/2017 CLINICAL DATA:  Nasogastric tube placement EXAM: PORTABLE ABDOMEN - 1 VIEW COMPARISON:  Abdominal CT 06/13/2013 FINDINGS: Nasogastric tube loops through the stomach with tip in the peri pyloric region. Pelvic thermistor. Nonobstructive bowel gas pattern. Gas is seen along distended but nondilated colon. IMPRESSION: Peripyloric nasogastric tube tip. Electronically Signed   By: Marnee Spring M.D.   On: 10/06/2017 16:53     STUDIES:  ABI 11/19 >> R ABI in the moderate range of arterial occlusive disease, L non-obtainable ABI with proximal occlusive disease  CULTURES: MRSA 11/19 >> negative  BCx2 11/12 >> negative   ANTIBIOTICS: Vancomycin 11/12 >> Zosyn 11/12 >>   SIGNIFICANT EVENTS: 11/12  Admit to [facility]  11/20  Transfer to [facility] after decompensation during EGD, intubation   LINES/TUBES: PICC RUE 11/14 >>  ETT  11/21>>>  DISCUSSION: 76 year old male with many cormorbidites admitted with left LLE cellulitis, developed GIB and decompensated during EGD requiring intubation.  Tx to [facility] on 11/21 for VVS & GI evaluation.   ASSESSMENT / PLAN:  PULMONARY A:  Acute Hypoxic Respiratory Failure - decompensated in setting of GIB/anemia and sedation Pulmonary Nodule   Former Smoker - remote, quit in 1966 P:   pcxr reviewed, shows int edema, hazziness LLL, effusion>, small volumes May need lasix abg reviewed, keep same MV Wean sbt  cpap 5 ps5, goal 1 hour Assess rsbi Assess risk re bleed with GI  CARDIOVASCULAR A:  Hypotension secondary to hemorraghic shock vs septic shock (doubt septic) PVD  Hx of paroxysmal a fib, CAD, ischemic cardiomyopathy s/p ICD, LBBB P:  ICU monitoring  HOLD home coumadin / plavix given GIB  Will need antiplatelet  in the future given high CHADSVAC > await EGD results  Await recs VVS, happy to send to [facility] when recommended  RENAL A:   CKD III Hyperchloremia P:   Even balance Lasix may be needed but vent needs not an issue and crt up, hold lasix Chem in am  Pos balance okay to 500 cc Avoid saline, with cl  GASTROINTESTINAL A:   Acute GI bleed in setting of coumadin and Plavix  P:   EGD pending 11/21 - reviewed today also PPI gtt maintain NPO, hold meds per NGT for now  reglan  HEMATOLOGIC A:   Acute Blood anemia secondary to Upper GI Bleed  Supratheraputic  INR secondary to coumadin s/p FFP P:  Trend CBC to q12h Follow PT/INR for consumption Hold home coumadin / plavix   INFECTIOUS A:   LLE Wound   P:   Continue broad spectrum abx for now  For MRI vvs recs awaited  ENDOCRINE A:   DM II Hypothyroidism P:   SSI  Lantus 30 units QD- dc as NPO Continue synthroid   NEUROLOGIC A:   Vent dyschrony P:   RASS goal: 0 to -1  WUA on prop   Ccm time 35 min   Mcarthur Rossetti. Tyson Alias, MD, FACP Pgr: [phone] [facility]

## 2017-10-07 NOTE — Brief Op Note (Signed)
09/27/2017 - 10/07/2017  8:11 AM  PATIENT:  [name]  76 y.o. male  PRE-OPERATIVE DIAGNOSIS:  hematemesis  POST-OPERATIVE DIAGNOSIS:  large blood clot in stomach  PROCEDURE:  Procedure(s): ESOPHAGOGASTRODUODENOSCOPY (EGD) (N/A)  SURGEON:  Surgeon(s) and Role:    * Dacotah Cabello, MD - Primary  Findings ------------- - Repeat EGD again today showed very large blood clot in the fundus and the gastric body, limiting visualization. - Lavage of the body was performed revealing clean-based ulcers and erosions. - Bleeding source could be in the fundus, but unfortunately we are not able to visualize it because of large blood clot in the fundus.  Recommendations -------------------------- - Continue Protonix drip for now. - Continue IV Reglan every 6 hours for now - Monitor H&H. Transfuse as needed - Consider repeat EGD over the weekend if evidence of ongoing bleeding or drop in hemoglobin. - GI will follow  Kathi Der MD, FACP 10/07/2017, 8:14 AM  Contact #  [phone]

## 2017-10-07 NOTE — Progress Notes (Addendum)
wean stopped at this time, tolerated well without incident.  Placed back on full support for the night.

## 2017-10-07 NOTE — Progress Notes (Signed)
RN at bedside to change the dressing of the wound that is on left foot.  Upon removal of old dressing, RN assessed the wound to have black eschar covering the entirety of the base of the wound.  The surrounding skin of the wound remained pink.  RN made Critical Care MD aware of the new findings on the assessment of the wound; pulse in foot remains at a baseline of 1+ and lower extremity remains warm with good capillary refills.  MD also made aware of the patient's low grade fever.   No further orders provided at this time.  MD advised RN to monitor the vascular of the extremity closely, and to monitor the fever as well.  If the vascular assessment changes and/or the fever goes to 101.5 or greater to make the MD aware.  Will continue to monitor.

## 2017-10-07 NOTE — Progress Notes (Signed)
Spoke with Critical Care MD in regards to PO/per tube medications.  MD order to hold all PO/per tube medications due to the limited results of EGD.    Will continue to monitor.

## 2017-10-08 ENCOUNTER — Inpatient Hospital Stay (HOSPITAL_COMMUNITY): Payer: PPO

## 2017-10-08 ENCOUNTER — Encounter (HOSPITAL_COMMUNITY): Payer: Self-pay | Admitting: Gastroenterology

## 2017-10-08 DIAGNOSIS — K922 Gastrointestinal hemorrhage, unspecified: Secondary | ICD-10-CM

## 2017-10-08 LAB — CBC WITH DIFFERENTIAL/PLATELET
Basophils Absolute: 0.1 10*3/uL (ref 0.0–0.1)
Basophils Relative: 1 %
EOS ABS: 0.2 10*3/uL (ref 0.0–0.7)
Eosinophils Relative: 2 %
HCT: 24.4 % — ABNORMAL LOW (ref 39.0–52.0)
HEMOGLOBIN: 8.1 g/dL — AB (ref 13.0–17.0)
LYMPHS ABS: 1 10*3/uL (ref 0.7–4.0)
Lymphocytes Relative: 9 %
MCH: 31.9 pg (ref 26.0–34.0)
MCHC: 33.2 g/dL (ref 30.0–36.0)
MCV: 96.1 fL (ref 78.0–100.0)
MONO ABS: 0.8 10*3/uL (ref 0.1–1.0)
MONOS PCT: 7 %
NEUTROS ABS: 9 10*3/uL — AB (ref 1.7–7.7)
Neutrophils Relative %: 81 %
Platelets: 309 10*3/uL (ref 150–400)
RBC: 2.54 MIL/uL — ABNORMAL LOW (ref 4.22–5.81)
RDW: 16 % — AB (ref 11.5–15.5)
WBC: 11 10*3/uL — AB (ref 4.0–10.5)

## 2017-10-08 LAB — COMPREHENSIVE METABOLIC PANEL
ALT: 19 U/L (ref 17–63)
ANION GAP: 5 (ref 5–15)
AST: 22 U/L (ref 15–41)
Albumin: 1.9 g/dL — ABNORMAL LOW (ref 3.5–5.0)
Alkaline Phosphatase: 73 U/L (ref 38–126)
BILIRUBIN TOTAL: 0.9 mg/dL (ref 0.3–1.2)
BUN: 36 mg/dL — ABNORMAL HIGH (ref 6–20)
CO2: 21 mmol/L — ABNORMAL LOW (ref 22–32)
Calcium: 8.2 mg/dL — ABNORMAL LOW (ref 8.9–10.3)
Chloride: 116 mmol/L — ABNORMAL HIGH (ref 101–111)
Creatinine, Ser: 1.99 mg/dL — ABNORMAL HIGH (ref 0.61–1.24)
GFR calc Af Amer: 36 mL/min — ABNORMAL LOW (ref >60)
GFR, EST NON AFRICAN AMERICAN: 31 mL/min — AB (ref >60)
Glucose, Bld: 117 mg/dL — ABNORMAL HIGH (ref 65–99)
POTASSIUM: 4 mmol/L (ref 3.5–5.1)
Sodium: 142 mmol/L (ref 135–145)
TOTAL PROTEIN: 5.2 g/dL — AB (ref 6.5–8.1)

## 2017-10-08 LAB — GLUCOSE, CAPILLARY
GLUCOSE-CAPILLARY: 113 mg/dL — AB (ref 65–99)
Glucose-Capillary: 106 mg/dL — ABNORMAL HIGH (ref 65–99)
Glucose-Capillary: 108 mg/dL — ABNORMAL HIGH (ref 65–99)
Glucose-Capillary: 117 mg/dL — ABNORMAL HIGH (ref 65–99)
Glucose-Capillary: 126 mg/dL — ABNORMAL HIGH (ref 65–99)
Glucose-Capillary: 127 mg/dL — ABNORMAL HIGH (ref 65–99)

## 2017-10-08 LAB — CBC
HCT: 25.2 % — ABNORMAL LOW (ref 39.0–52.0)
HEMATOCRIT: 27 % — AB (ref 39.0–52.0)
Hemoglobin: 8.8 g/dL — ABNORMAL LOW (ref 13.0–17.0)
Hemoglobin: 8.3 g/dL — ABNORMAL LOW (ref 13.0–17.0)
MCH: 31.7 pg (ref 26.0–34.0)
MCH: 31.9 pg (ref 26.0–34.0)
MCHC: 32.6 g/dL (ref 30.0–36.0)
MCHC: 32.9 g/dL (ref 30.0–36.0)
MCV: 97.1 fL (ref 78.0–100.0)
MCV: 96.9 fL (ref 78.0–100.0)
PLATELETS: 336 10*3/uL (ref 150–400)
PLATELETS: 341 10*3/uL (ref 150–400)
RBC: 2.78 MIL/uL — ABNORMAL LOW (ref 4.22–5.81)
RBC: 2.6 MIL/uL — ABNORMAL LOW (ref 4.22–5.81)
RDW: 16.1 % — AB (ref 11.5–15.5)
RDW: 15.9 % — AB (ref 11.5–15.5)
WBC: 12.9 10*3/uL — ABNORMAL HIGH (ref 4.0–10.5)
WBC: 12.9 10*3/uL — AB (ref 4.0–10.5)

## 2017-10-08 LAB — TRIGLYCERIDES: Triglycerides: 341 mg/dL — ABNORMAL HIGH (ref <150)

## 2017-10-08 LAB — BRAIN NATRIURETIC PEPTIDE: B NATRIURETIC PEPTIDE 5: 498 pg/mL — AB (ref 0.0–100.0)

## 2017-10-08 MED ORDER — METOPROLOL TARTRATE 5 MG/5ML IV SOLN
5.0000 mg | Freq: Three times a day (TID) | INTRAVENOUS | Status: DC
  Administered 2017-10-08 – 2017-10-15 (×20): 5 mg via INTRAVENOUS
  Filled 2017-10-08 (×20): qty 5

## 2017-10-08 MED ORDER — CEFAZOLIN SODIUM-DEXTROSE 2-4 GM/100ML-% IV SOLN
2.0000 g | Freq: Three times a day (TID) | INTRAVENOUS | Status: DC
  Administered 2017-10-08 – 2017-10-14 (×17): 2 g via INTRAVENOUS
  Filled 2017-10-08 (×24): qty 100

## 2017-10-08 MED ORDER — FUROSEMIDE 10 MG/ML IJ SOLN
20.0000 mg | Freq: Once | INTRAMUSCULAR | Status: AC
  Administered 2017-10-08: 20 mg via INTRAVENOUS
  Filled 2017-10-08: qty 2

## 2017-10-08 MED ORDER — LEVOTHYROXINE SODIUM 100 MCG IV SOLR
25.0000 ug | Freq: Every day | INTRAVENOUS | Status: DC
  Administered 2017-10-08 – 2017-10-11 (×4): 25 ug via INTRAVENOUS
  Filled 2017-10-08 (×6): qty 5

## 2017-10-08 NOTE — Consult Note (Signed)
WOC Nurse wound consult note Reason for Consult: Trauma wound to left plantar foot, stepped on a roofing nail.  Resulting cellulitis to left foot. Neuropathy and vascular insufficiency Wound type:Infectious Pressure Injury POA: NA Measurement: Plantar foot:  Peeling epithelium 9 cm x 4.3 cm  With central area of eschar measuring  3 cm x 3 cm, extends up to metatarsals.  Wound bed: 50% dry black eschar, remaining is erythematous Drainage (amount, consistency, odor) none noted Periwound: Dressing procedure/placement/frequency:Cleanse wound to left plantar foot with Ns.  Apply Santyl to wound bed.  Cover with NS moist gause.  Secure with 4x4 gauze and kerlix/tape.  Change daily Will not follow at this time.  Please re-consult if needed.  Maple Hudson RN BSN CWON Pager [phone]

## 2017-10-08 NOTE — Progress Notes (Signed)
PULMONARY / CRITICAL CARE MEDICINE   Name: [name] MRN: [id number] DOB: [birth year]    ADMISSION DATE:  09/27/2017   CHIEF COMPLAINT:  Left foot cellulitis and New GI Bleed   HISTORY OF PRESENT ILLNESS:  76 y.o. male with medical history of diabetes mellitus, coronary artery disease on plavix, paroxysmal atrial fibrillation on coumadin, CKD stage III, rheumatoid arthritis, ischemic cardiomyopathy status post ICD presented on 11/12 with 3-day history of increasing pain, edema, erythema about his left foot. He stepped on a nail that 2 weeks prior to this admission. The patient was soaking his foot in Epsom salts over the period of 2 weeks.   He visited emergency department on September 24, 2017.  X-rays were negative for any erosions.  The patient was sent home with ciprofloxacin and clindamycin.  However, over the past 2-3 days he has noted increasing edema, pain, and erythema and returned to the emergency department.   Patient was started on broad spectrum abx and switched to oral doxycycline but his cellulitis got worse and was restarted on IV abx. During this admission patient also developed dark bloody bowel movements with subsequent hypotension (11/16). He was given multiple units of blood and FFP for coumadin reversal.   Patient was planned for EGD on 11/20. He was given sedatives which caused patient to become hypoxic and cyanotic. He was subsequently intubated. Patient was also evaluated by surgery for his foot who recommended patient be transferred to [facility] for vascular surgery evaluation for possible intervention due to occlusive vascular disease being found in workup.    SUBJECTIVE:  RN reports PO meds remain on hold, dark material draining from gastric tube.  Weaning on PSV 5/5.  Wife at bedside.    VITAL SIGNS: BP (!) 167/61   Pulse 69   Temp 98.6 F (37 C)   Resp 16   Ht 5\' 8"  (1.727 m)   Wt 270 lb 15.1 oz (122.9 kg)   SpO2 99%   BMI 41.20 kg/m   HEMODYNAMICS:     VENTILATOR SETTINGS: Vent Mode: PRVC FiO2 (%):  [30 %] 30 % Set Rate:  [17 bmp] 17 bmp Vt Set:  [550 mL] 550 mL PEEP:  [5 cmH20] 5 cmH20 Pressure Support:  [8 cmH20] 8 cmH20 Plateau Pressure:  [18 cmH20] 18 cmH20  INTAKE / OUTPUT: I/O last 3 completed shifts: In: 2507.4 [I.V.:2057.4; IV Piggyback:450] Out: 2970 [Urine:2370; Emesis/NG output:600]  PHYSICAL EXAMINATION: General: Obese male in NAD on vent HEENT: MM pink/dry, ETT, gastric tube with dark material in tubing Neuro: Sedate, nods yes / no CV: s1s2 rrr, no m/r/g PULM: even/non-labored, lungs bilaterally clear GI: soft, protuberant, non-tender, bsx4 active  Extremities: warm/dry, 2+ pitting BLE edema  Skin: no rashes or lesions  LABS:  BMET Recent Labs  Lab 10/06/17 0445 10/07/17 0328 10/08/17 0500  NA 141 140 142  K 4.2 4.2 4.0  CL 116* 113* 116*  CO2 23 22 21*  BUN 44* 41* 36*  CREATININE 1.97* 2.01* 1.99*  GLUCOSE 163* 137* 117*    Electrolytes Recent Labs  Lab 10/05/17 2213 10/06/17 0445 10/07/17 0328 10/08/17 0500  CALCIUM 8.5* 8.3* 8.3* 8.2*  MG 2.2  --   --   --   PHOS 2.8  --   --   --     CBC Recent Labs  Lab 10/07/17 1120 10/07/17 2237 10/08/17 0500  WBC 12.7* 11.1* 11.0*  HGB 8.5* 8.5* 8.1*  HCT 26.0* 26.1* 24.4*  PLT 319 339  309    Coag's Recent Labs  Lab 10/04/17 0451 10/05/17 0624 10/05/17 2213  APTT  --   --  44*  INR 4.52* 1.35 1.38    Sepsis Markers Recent Labs  Lab 10/06/17 0445  LATICACIDVEN 0.6    ABG Recent Labs  Lab 10/05/17 1543  PHART 7.332*  PCO2ART 40.7  PO2ART 299*    Liver Enzymes Recent Labs  Lab 10/05/17 2213 10/07/17 0328 10/08/17 0500  AST 49* 32 22  ALT 24 20 19   ALKPHOS 78 81 73  BILITOT 0.7 0.6 0.9  ALBUMIN 2.0* 2.0* 1.9*    Cardiac Enzymes No results for input(s): TROPONINI, PROBNP in the last 168 hours.  Glucose Recent Labs  Lab 10/07/17 0724 10/07/17 1146 10/07/17 1919 10/07/17 2329 10/08/17 0315  10/08/17 0728  GLUCAP 139* 128* 135* 120* 113* 106*    Imaging Dg Chest Port 1 View  Result Date: 10/08/2017 CLINICAL DATA:  Respiratory failure. EXAM: PORTABLE CHEST 1 VIEW COMPARISON:  10/07/2017. FINDINGS: Endotracheal tube, NG tube, right PICC line stable position. Cardiac pacer with lead tips in the right atrium and right ventricle. Cardiomegaly. Mild interstitial prominence noted bilaterally consistent with CHF. Low lung volumes with basilar atelectasis. Small left pleural effusion again noted . No pneumothorax. IMPRESSION: 1. Cardiac scratched it lines and tubes in stable position. 2. Cardiac pacer stable position. Cardiomegaly with mild bilateral interstitial prominence suggesting CHF noted on today's exam. Small left pleural effusion. 3.  Low lung volumes with bibasilar atelectasis . Electronically Signed   By: Maisie Fus  Register   On: 10/08/2017 06:32     STUDIES:  ABI 11/19 >> R ABI in the moderate range of arterial occlusive disease, L non-obtainable ABI with proximal occlusive disease EGD 11/21 >> large clot in gastric covering most of the gastric body, can not fully evaluated gastric body due to clot  EGD 11/22 >> very large clot in the fundus & gastric body, clean based ulcers/erosions, unable to visualize bleeding source due to clot  CULTURES: MRSA 11/19 >> negative  BCx2 11/12 >> negative   ANTIBIOTICS: Vancomycin 11/12 >> 11/23 Zosyn 11/12 >> 11/23 Ancef 11/23 >>   SIGNIFICANT EVENTS: 11/12  Admit to [facility]  11/20  Transfer to [facility] after decompensation during EGD, intubation  11/22  EGD - clot larges on mtach.  Repeat egd same.  Remains on vent  LINES/TUBES: PICC RUE 11/14 >>  ETT 11/21>>>  DISCUSSION: 76 year old male with many cormorbidites admitted with left LLE cellulitis, developed GIB and decompensated during EGD requiring intubation.  Tx to [facility] on 11/21 for VVS & GI evaluation.   ASSESSMENT / PLAN:  PULMONARY A:  Acute Hypoxic Respiratory Failure -  decompensated in setting of GIB/anemia and sedation Pulmonary Nodule   Former Smoker - remote, quit in 1966 P:   PRVC 8cc/kg Daily SBT / WUA Intermittent chest x-ray  Lasix 20 mg IV x1 Hold extubation until GI needs sorted out > potential EGD this weekend  CARDIOVASCULAR A:  Hypotension secondary to hemorraghic shock vs septic shock (doubt septic) PVD  Hx of paroxysmal a fib, CAD, ischemic cardiomyopathy s/p ICD, LBBB P:  ICU monitoring Hold home Coumadin, Plavix given GI bleed Will need antiplatelet in the future given high CHADSVAC VVS following > will ultimately need to be transferred to [facility] for foot eval Hold home antihypertensives, scheduled Lopressor in lieu of Coreg RN hydralazine   RENAL A:   CKD III Hyperchloremia P:   Trend BMP / urinary output  Replace electrolytes as indicated Avoid nephrotoxic agents, ensure adequate renal perfusion KVO IV fluids  GASTROINTESTINAL A:   Acute GIB - on coumadin and Plavix, EGD with concern for gastric ulcers with large clot P:   GI following, appreciate input Continue PPI gtt NPO For possible EGD over the weekend   HEMATOLOGIC A:   Acute Blood anemia secondary to Upper GI Bleed  Supratheraputic INR secondary to coumadin s/p FFP P:  Trend CBC Q12 Follow PT/INR Hold home Coumadin and Plavix  INFECTIOUS A:   LLE Wound   P:   MRI on hold due to pacemaker /orthopedic hardware WOC consult D12/x abx  Abx narrowed to Ancef.  Discussed with ID.  Will plan to continue to treat empirically until imaging obtained / decision made regarding revascularization vs possible amputation if osteo.    ENDOCRINE A:   DM II Hypothyroidism P:   SSI Hold Lantus while NPO Continue Synthroid, changed to IV dosing  NEUROLOGIC A:   Vent dyschrony P:   RASS goal: 0 to -1 Propofol for sedation Daily WUA/SBT   CC Time: 30 minutes  Canary Brim, NP-C [facility] Pgr: [phone] or if no answer  [phone] 10/08/2017, 9:58 AM

## 2017-10-08 NOTE — Progress Notes (Signed)
PT Cancellation Note  Patient Details Name: [name] MRN: [id number] DOB: Mar 28, 1941   Cancelled Treatment:     PT deferred this date at request of RN - pt remains on vent.  Will follow.   [name] 10/08/2017, 8:30 AM

## 2017-10-08 NOTE — Progress Notes (Signed)
[facility] Progress Note  Subjective: The patient has had 2 EGDs with also seen in the stomach but visualization obscured by a lot of blood clot in the stomach. NG tube draining dark material but not bright red. Coumadin and Plavix have been stopped. INR reversed, but effects of Plavix most likely still on board.  Objective: Vital signs in last 24 hours: Temp:  [98.6 F (37 C)-100.8 F (38.2 C)] 98.6 F (37 C) (11/23 0900) Pulse Rate:  [61-84] 69 (11/23 0900) Resp:  [16-29] 16 (11/23 0900) BP: (133-171)/(41-74) 167/61 (11/23 0900) SpO2:  [94 %-100 %] 99 % (11/23 0900) FiO2 (%):  [30 %] 30 % (11/23 0840) Weight change:    PE:  Intubated  Heart regular rhythm  Lungs clear  Abdomen soft and nontender  Lab Results: Results for orders placed or performed during the hospital encounter of 09/27/17 (from the past 24 hour(s))  CBC     Status: Abnormal   Collection Time: 10/07/17 11:20 AM  Result Value Ref Range   WBC 12.7 (H) 4.0 - 10.5 K/uL   RBC 2.71 (L) 4.22 - 5.81 MIL/uL   Hemoglobin 8.5 (L) 13.0 - 17.0 g/dL   HCT 26.9 (L) 48.5 - 46.2 %   MCV 95.9 78.0 - 100.0 fL   MCH 31.4 26.0 - 34.0 pg   MCHC 32.7 30.0 - 36.0 g/dL   RDW 70.3 (H) 50.0 - 93.8 %   Platelets 319 150 - 400 K/uL  Glucose, capillary     Status: Abnormal   Collection Time: 10/07/17 11:46 AM  Result Value Ref Range   Glucose-Capillary 128 (H) 65 - 99 mg/dL  Glucose, capillary     Status: Abnormal   Collection Time: 10/07/17  7:19 PM  Result Value Ref Range   Glucose-Capillary 135 (H) 65 - 99 mg/dL  CBC     Status: Abnormal   Collection Time: 10/07/17 10:37 PM  Result Value Ref Range   WBC 11.1 (H) 4.0 - 10.5 K/uL   RBC 2.73 (L) 4.22 - 5.81 MIL/uL   Hemoglobin 8.5 (L) 13.0 - 17.0 g/dL   HCT 18.2 (L) 99.3 - 71.6 %   MCV 95.6 78.0 - 100.0 fL   MCH 31.1 26.0 - 34.0 pg   MCHC 32.6 30.0 - 36.0 g/dL   RDW 96.7 (H) 89.3 - 81.0 %   Platelets 339 150 - 400 K/uL  Glucose, capillary     Status:  Abnormal   Collection Time: 10/07/17 11:29 PM  Result Value Ref Range   Glucose-Capillary 120 (H) 65 - 99 mg/dL  Glucose, capillary     Status: Abnormal   Collection Time: 10/08/17  3:15 AM  Result Value Ref Range   Glucose-Capillary 113 (H) 65 - 99 mg/dL  Comprehensive metabolic panel     Status: Abnormal   Collection Time: 10/08/17  5:00 AM  Result Value Ref Range   Sodium 142 135 - 145 mmol/L   Potassium 4.0 3.5 - 5.1 mmol/L   Chloride 116 (H) 101 - 111 mmol/L   CO2 21 (L) 22 - 32 mmol/L   Glucose, Bld 117 (H) 65 - 99 mg/dL   BUN 36 (H) 6 - 20 mg/dL   Creatinine, Ser 1.75 (H) 0.61 - 1.24 mg/dL   Calcium 8.2 (L) 8.9 - 10.3 mg/dL   Total Protein 5.2 (L) 6.5 - 8.1 g/dL   Albumin 1.9 (L) 3.5 - 5.0 g/dL   AST 22 15 - 41 U/L   ALT 19 17 -  63 U/L   Alkaline Phosphatase 73 38 - 126 U/L   Total Bilirubin 0.9 0.3 - 1.2 mg/dL   GFR calc non Af Amer 31 (L) >60 mL/min   GFR calc Af Amer 36 (L) >60 mL/min   Anion gap 5 5 - 15  CBC with Differential/Platelet     Status: Abnormal   Collection Time: 10/08/17  5:00 AM  Result Value Ref Range   WBC 11.0 (H) 4.0 - 10.5 K/uL   RBC 2.54 (L) 4.22 - 5.81 MIL/uL   Hemoglobin 8.1 (L) 13.0 - 17.0 g/dL   HCT 62.9 (L) 47.6 - 54.6 %   MCV 96.1 78.0 - 100.0 fL   MCH 31.9 26.0 - 34.0 pg   MCHC 33.2 30.0 - 36.0 g/dL   RDW 50.3 (H) 54.6 - 56.8 %   Platelets 309 150 - 400 K/uL   Neutrophils Relative % 81 %   Neutro Abs 9.0 (H) 1.7 - 7.7 K/uL   Lymphocytes Relative 9 %   Lymphs Abs 1.0 0.7 - 4.0 K/uL   Monocytes Relative 7 %   Monocytes Absolute 0.8 0.1 - 1.0 K/uL   Eosinophils Relative 2 %   Eosinophils Absolute 0.2 0.0 - 0.7 K/uL   Basophils Relative 1 %   Basophils Absolute 0.1 0.0 - 0.1 K/uL  Brain natriuretic peptide     Status: Abnormal   Collection Time: 10/08/17  5:00 AM  Result Value Ref Range   B Natriuretic Peptide 498.0 (H) 0.0 - 100.0 pg/mL  Glucose, capillary     Status: Abnormal   Collection Time: 10/08/17  7:28 AM  Result Value  Ref Range   Glucose-Capillary 106 (H) 65 - 99 mg/dL    Studies/Results: Dg Chest Port 1 View  Result Date: 10/08/2017 CLINICAL DATA:  Respiratory failure. EXAM: PORTABLE CHEST 1 VIEW COMPARISON:  10/07/2017. FINDINGS: Endotracheal tube, NG tube, right PICC line stable position. Cardiac pacer with lead tips in the right atrium and right ventricle. Cardiomegaly. Mild interstitial prominence noted bilaterally consistent with CHF. Low lung volumes with basilar atelectasis. Small left pleural effusion again noted . No pneumothorax. IMPRESSION: 1. Cardiac scratched it lines and tubes in stable position. 2. Cardiac pacer stable position. Cardiomegaly with mild bilateral interstitial prominence suggesting CHF noted on today's exam. Small left pleural effusion. 3.  Low lung volumes with bibasilar atelectasis . Electronically Signed   By: Maisie Fus  Register   On: 10/08/2017 06:32      Assessment: Upper GI bleed secondary to gastric ulcers, visualization however obscured by a lot of blood clot in stomach.  Plan:   Continue supportive care. Transfuse as needed. Follow clinically. Hopefully the effects on platelets secondary to Plavix will wear off.    [name] 10/08/2017, 9:58 AM  Pager: [phone] If no answer or after 5 PM call [phone] Lab Results  Component Value Date   HGB 8.1 (L) 10/08/2017   HGB 8.5 (L) 10/07/2017   HGB 8.5 (L) 10/07/2017   HCT 24.4 (L) 10/08/2017   HCT 26.1 (L) 10/07/2017   HCT 26.0 (L) 10/07/2017   ALKPHOS 73 10/08/2017   ALKPHOS 81 10/07/2017   ALKPHOS 78 10/05/2017   AST 22 10/08/2017   AST 32 10/07/2017   AST 49 (H) 10/05/2017   ALT 19 10/08/2017   ALT 20 10/07/2017   ALT 24 10/05/2017

## 2017-10-09 ENCOUNTER — Inpatient Hospital Stay (HOSPITAL_COMMUNITY): Payer: PPO

## 2017-10-09 LAB — GLUCOSE, CAPILLARY
GLUCOSE-CAPILLARY: 111 mg/dL — AB (ref 65–99)
GLUCOSE-CAPILLARY: 125 mg/dL — AB (ref 65–99)
Glucose-Capillary: 125 mg/dL — ABNORMAL HIGH (ref 65–99)
Glucose-Capillary: 125 mg/dL — ABNORMAL HIGH (ref 65–99)

## 2017-10-09 LAB — BASIC METABOLIC PANEL
Anion gap: 8 (ref 5–15)
BUN: 32 mg/dL — ABNORMAL HIGH (ref 6–20)
CALCIUM: 8.4 mg/dL — AB (ref 8.9–10.3)
CO2: 21 mmol/L — ABNORMAL LOW (ref 22–32)
CREATININE: 1.98 mg/dL — AB (ref 0.61–1.24)
Chloride: 115 mmol/L — ABNORMAL HIGH (ref 101–111)
GFR, EST AFRICAN AMERICAN: 36 mL/min — AB (ref >60)
GFR, EST NON AFRICAN AMERICAN: 31 mL/min — AB (ref >60)
Glucose, Bld: 119 mg/dL — ABNORMAL HIGH (ref 65–99)
Potassium: 4.1 mmol/L (ref 3.5–5.1)
SODIUM: 144 mmol/L (ref 135–145)

## 2017-10-09 LAB — CBC
HEMATOCRIT: 25.9 % — AB (ref 39.0–52.0)
HEMOGLOBIN: 8.3 g/dL — AB (ref 13.0–17.0)
MCH: 31.2 pg (ref 26.0–34.0)
MCHC: 32 g/dL (ref 30.0–36.0)
MCV: 97.4 fL (ref 78.0–100.0)
Platelets: 348 10*3/uL (ref 150–400)
RBC: 2.66 MIL/uL — AB (ref 4.22–5.81)
RDW: 16.1 % — ABNORMAL HIGH (ref 11.5–15.5)
WBC: 14.1 10*3/uL — ABNORMAL HIGH (ref 4.0–10.5)

## 2017-10-09 MED ORDER — FUROSEMIDE 10 MG/ML IJ SOLN
20.0000 mg | Freq: Once | INTRAMUSCULAR | Status: AC
  Administered 2017-10-09: 20 mg via INTRAVENOUS
  Filled 2017-10-09: qty 2

## 2017-10-09 MED ORDER — COLLAGENASE 250 UNIT/GM EX OINT
TOPICAL_OINTMENT | Freq: Every day | CUTANEOUS | Status: DC
  Administered 2017-10-09 – 2017-10-10 (×3): via TOPICAL
  Administered 2017-10-11: 1 via TOPICAL
  Administered 2017-10-12 – 2017-10-14 (×3): via TOPICAL
  Administered 2017-10-15: 1 via TOPICAL
  Filled 2017-10-09: qty 30
  Filled 2017-10-09: qty 90

## 2017-10-09 NOTE — Progress Notes (Signed)
PULMONARY / CRITICAL CARE MEDICINE   Name: [name] MRN: [id number] DOB: [birth year]    ADMISSION DATE:  09/27/2017   CHIEF COMPLAINT:  Left foot cellulitis and New GI Bleed   HISTORY OF PRESENT ILLNESS:  76 y.o. male with medical history of diabetes mellitus, coronary artery disease on plavix, paroxysmal atrial fibrillation on coumadin, CKD stage III, rheumatoid arthritis, ischemic cardiomyopathy status post ICD presented on 11/12 with 3-day history of increasing pain, edema, erythema about his left foot. He stepped on a nail that 2 weeks prior to this admission. The patient was soaking his foot in Epsom salts over the period of 2 weeks.   He visited emergency department on September 24, 2017.  X-rays were negative for any erosions.  The patient was sent home with ciprofloxacin and clindamycin.  However, over the past 2-3 days he has noted increasing edema, pain, and erythema and returned to the emergency department.   Patient was started on broad spectrum abx and switched to oral doxycycline but his cellulitis got worse and was restarted on IV abx. During this admission patient also developed dark bloody bowel movements with subsequent hypotension (11/16). He was given multiple units of blood and FFP for coumadin reversal.   Patient was planned for EGD on 11/20. He was given sedatives which caused patient to become hypoxic and cyanotic. He was subsequently intubated. Patient was also evaluated by surgery for his foot who recommended patient be transferred to [facility] for vascular surgery evaluation for possible intervention due to occlusive vascular disease being found in workup.    SUBJECTIVE:  Remains on vent For egd   VITAL SIGNS: BP (!) 151/54   Pulse 63   Temp 99.5 F (37.5 C)   Resp 17   Ht 5\' 8"  (1.727 m)   Wt 122.9 kg (270 lb 15.1 oz)   SpO2 97%   BMI 41.20 kg/m   HEMODYNAMICS:    VENTILATOR SETTINGS: Vent Mode: PRVC FiO2 (%):  [30 %] 30 % Set Rate:  [17 bmp] 17  bmp Vt Set:  [550 mL] 550 mL PEEP:  [5 cmH20] 5 cmH20 Pressure Support:  [5 cmH20] 5 cmH20 Plateau Pressure:  [15 cmH20-22 cmH20] 15 cmH20  INTAKE / OUTPUT: I/O last 3 completed shifts: In: 3618.9 [I.V.:3218.9; IV Piggyback:400] Out: 4725 [Urine:3325; Emesis/NG output:1400]  PHYSICAL EXAMINATION: General: not on WUA, not fc Neuro: rass -2, not fc now HEENT: obese, jvd wnl or up PULM: coarse distant CV:  s1 s2 RRR no r GI: soft, bs wnl, obese Extremities: edema remains  LABS:  BMET Recent Labs  Lab 10/07/17 0328 10/08/17 0500 10/09/17 0413  NA 140 142 144  K 4.2 4.0 4.1  CL 113* 116* 115*  CO2 22 21* 21*  BUN 41* 36* 32*  CREATININE 2.01* 1.99* 1.98*  GLUCOSE 137* 117* 119*    Electrolytes Recent Labs  Lab 10/05/17 2213  10/07/17 0328 10/08/17 0500 10/09/17 0413  CALCIUM 8.5*   < > 8.3* 8.2* 8.4*  MG 2.2  --   --   --   --   PHOS 2.8  --   --   --   --    < > = values in this interval not displayed.    CBC Recent Labs  Lab 10/08/17 0500 10/08/17 1419 10/08/17 2256  WBC 11.0* 12.9* 12.9*  HGB 8.1* 8.8* 8.3*  HCT 24.4* 27.0* 25.2*  PLT 309 336 341    Coag's Recent Labs  Lab 10/04/17 0451 10/05/17  3888 10/05/17 2213  APTT  --   --  44*  INR 4.52* 1.35 1.38    Sepsis Markers Recent Labs  Lab 10/06/17 0445  LATICACIDVEN 0.6    ABG Recent Labs  Lab 10/05/17 1543  PHART 7.332*  PCO2ART 40.7  PO2ART 299*    Liver Enzymes Recent Labs  Lab 10/05/17 2213 10/07/17 0328 10/08/17 0500  AST 49* 32 22  ALT 24 20 19   ALKPHOS 78 81 73  BILITOT 0.7 0.6 0.9  ALBUMIN 2.0* 2.0* 1.9*    Cardiac Enzymes No results for input(s): TROPONINI, PROBNP in the last 168 hours.  Glucose Recent Labs  Lab 10/08/17 1158 10/08/17 1620 10/08/17 2002 10/08/17 2350 10/09/17 0407 10/09/17 0744  GLUCAP 117* 127* 108* 126* 111* 125*    Imaging Dg Chest Port 1 View  Result Date: 10/09/2017 CLINICAL DATA:  Acute respiratory failure with  hypoxia. EXAM: PORTABLE CHEST 1 VIEW COMPARISON:  10/08/2017 FINDINGS: Endotracheal tube terminates 4 cm above the carina. An ICD remains in place. Right PICC terminates over the high right atrium. An enteric tube courses towards the left upper abdomen with tip not imaged. The cardiomediastinal silhouette is unchanged. Lung volumes remain diminished with mild pulmonary vascular congestion. Left greater than right basilar lung opacities are unchanged. There are persistent small left and possibly small right pleural effusions. No pneumothorax is identified. IMPRESSION: No change. Low lung volumes with left greater than right basilar opacity, likely atelectasis though pneumonia is possible on the left. Small pleural effusions. Electronically Signed   By: 10/10/2017 M.D.   On: 10/09/2017 07:29     STUDIES:  ABI 11/19 >> R ABI in the moderate range of arterial occlusive disease, L non-obtainable ABI with proximal occlusive disease EGD 11/21 >> large clot in gastric covering most of the gastric body, can not fully evaluated gastric body due to clot  EGD 11/22 >> very large clot in the fundus & gastric body, clean based ulcers/erosions, unable to visualize bleeding source due to clot egd 11/24>>>  CULTURES: MRSA 11/19 >> negative  BCx2 11/12 >> negative   ANTIBIOTICS: Vancomycin 11/12 >> 11/23 Zosyn 11/12 >> 11/23 Ancef 11/23 >>   SIGNIFICANT EVENTS: 11/12  Admit to [facility]  11/20  Transfer to [facility] after decompensation during EGD, intubation  11/22  EGD - clot larges on mtach.  Repeat egd same.  Remains on vent  LINES/TUBES: PICC RUE 11/14 >>  ETT 11/21>>>  DISCUSSION: 76 year old male with many cormorbidites admitted with left LLE cellulitis, developed GIB and decompensated during EGD requiring intubation.  Tx to [facility] on 11/21 for VVS & GI evaluation.   ASSESSMENT / PLAN:  PULMONARY A:  Acute Hypoxic Respiratory Failure - decompensated in setting of GIB/anemia and sedation Pulmonary Nodule    Former Smoker - remote, quit in 1966 Left effusion, edema P:   Has effusion layering / edema left greater rt, favor continued neg balance pcxr repeat in am  SBT cpap 5 ps5, goal 2 hours, assess egd results then consider extubation pending results Upright Last abg reviewed, keep MV  CARDIOVASCULAR A:  Hypotension secondary to hemorraghic shock vs septic shock (doubt septic) PVD  Hx of paroxysmal a fib, CAD, ischemic cardiomyopathy s/p ICD, LBBB P:  ICU monitoring Hold home Coumadin, Plavix given GI bleed VVS following > will ultimately need to be transferred to [facility] for foot eval Lasix as needed  RENAL A:   CKD III Hyperchloremia P:   Avoid saline with Cl  Replace electrolytes as indicated KVO IV fluids Lasix repeat   GASTROINTESTINAL A:   Acute GIB - on coumadin and Plavix, EGD with concern for gastric ulcers with large clot P:   GI following, appreciate input - await egd today ? Continue PPI gtt  HEMATOLOGIC A:   Acute Blood anemia secondary to Upper GI Bleed  Supratheraputic INR secondary to coumadin s/p FFP P:  Trend CBC Q12 - remain Follow PT/INR Hold home Coumadin and Plavix  INFECTIOUS A:   LLE Wound   P:   MRI on hold due to pacemaker /orthopedic hardware WOC consulted ancef Abx narrowed to Ancef.  Osteo treatment empiric Follow vascular recs  ENDOCRINE A:   DM II Hypothyroidism P:   SSI - low needs Continue Synthroid, changed to IV dosing  NEUROLOGIC A:   Vent dyschrony P:   RASS goal: 0  Propofol for sedation Daily WUA   CC Time: 30 minutes  Mcarthur Rossetti. Tyson Alias, MD, FACP Pgr: [phone] [facility]

## 2017-10-09 NOTE — Progress Notes (Signed)
[facility] Progress Note  Subjective: No complaints. No signs of further bleeding per NG drainage which is now yellow in color. Patient has had 2 prior EGDs last week showing some linear ulcers in stomach and a clot in the fundus. Anticoagulation has been held. Patient clinically improving from GI standpoint. Hemoglobin and hematocrit stable overnight.  Objective: Vital signs in last 24 hours: Temp:  [99 F (37.2 C)-100.4 F (38 C)] 99.5 F (37.5 C) (11/24 0700) Pulse Rate:  [63-81] 63 (11/24 0700) Resp:  [17-26] 17 (11/24 0700) BP: (143-166)/(45-59) 151/54 (11/24 0700) SpO2:  [94 %-99 %] 99 % (11/24 1211) FiO2 (%):  [30 %] 30 % (11/24 1211) Weight change:    PE:  No distress  NG drainage yellow  Abdomen nontender  Lab Results: Results for orders placed or performed during the hospital encounter of 09/27/17 (from the past 24 hour(s))  CBC     Status: Abnormal   Collection Time: 10/08/17  2:19 PM  Result Value Ref Range   WBC 12.9 (H) 4.0 - 10.5 K/uL   RBC 2.78 (L) 4.22 - 5.81 MIL/uL   Hemoglobin 8.8 (L) 13.0 - 17.0 g/dL   HCT 46.5 (L) 68.1 - 27.5 %   MCV 97.1 78.0 - 100.0 fL   MCH 31.7 26.0 - 34.0 pg   MCHC 32.6 30.0 - 36.0 g/dL   RDW 17.0 (H) 01.7 - 49.4 %   Platelets 336 150 - 400 K/uL  Triglycerides     Status: Abnormal   Collection Time: 10/08/17  2:19 PM  Result Value Ref Range   Triglycerides 341 (H) <150 mg/dL  Glucose, capillary     Status: Abnormal   Collection Time: 10/08/17  4:20 PM  Result Value Ref Range   Glucose-Capillary 127 (H) 65 - 99 mg/dL   Comment 1 Notify RN    Comment 2 Document in Chart   Glucose, capillary     Status: Abnormal   Collection Time: 10/08/17  8:02 PM  Result Value Ref Range   Glucose-Capillary 108 (H) 65 - 99 mg/dL   Comment 1 Notify RN    Comment 2 Document in Chart   CBC     Status: Abnormal   Collection Time: 10/08/17 10:56 PM  Result Value Ref Range   WBC 12.9 (H) 4.0 - 10.5 K/uL   RBC 2.60 (L) 4.22 -  5.81 MIL/uL   Hemoglobin 8.3 (L) 13.0 - 17.0 g/dL   HCT 49.6 (L) 75.9 - 16.3 %   MCV 96.9 78.0 - 100.0 fL   MCH 31.9 26.0 - 34.0 pg   MCHC 32.9 30.0 - 36.0 g/dL   RDW 84.6 (H) 65.9 - 93.5 %   Platelets 341 150 - 400 K/uL  Glucose, capillary     Status: Abnormal   Collection Time: 10/08/17 11:50 PM  Result Value Ref Range   Glucose-Capillary 126 (H) 65 - 99 mg/dL  Glucose, capillary     Status: Abnormal   Collection Time: 10/09/17  4:07 AM  Result Value Ref Range   Glucose-Capillary 111 (H) 65 - 99 mg/dL   Comment 1 Notify RN    Comment 2 Document in Chart   Basic metabolic panel     Status: Abnormal   Collection Time: 10/09/17  4:13 AM  Result Value Ref Range   Sodium 144 135 - 145 mmol/L   Potassium 4.1 3.5 - 5.1 mmol/L   Chloride 115 (H) 101 - 111 mmol/L   CO2 21 (L) 22 - 32  mmol/L   Glucose, Bld 119 (H) 65 - 99 mg/dL   BUN 32 (H) 6 - 20 mg/dL   Creatinine, Ser 1.77 (H) 0.61 - 1.24 mg/dL   Calcium 8.4 (L) 8.9 - 10.3 mg/dL   GFR calc non Af Amer 31 (L) >60 mL/min   GFR calc Af Amer 36 (L) >60 mL/min   Anion gap 8 5 - 15  Glucose, capillary     Status: Abnormal   Collection Time: 10/09/17  7:44 AM  Result Value Ref Range   Glucose-Capillary 125 (H) 65 - 99 mg/dL   Comment 1 Notify RN    Comment 2 Document in Chart   CBC     Status: Abnormal   Collection Time: 10/09/17 10:59 AM  Result Value Ref Range   WBC 14.1 (H) 4.0 - 10.5 K/uL   RBC 2.66 (L) 4.22 - 5.81 MIL/uL   Hemoglobin 8.3 (L) 13.0 - 17.0 g/dL   HCT 93.9 (L) 03.0 - 09.2 %   MCV 97.4 78.0 - 100.0 fL   MCH 31.2 26.0 - 34.0 pg   MCHC 32.0 30.0 - 36.0 g/dL   RDW 33.0 (H) 07.6 - 22.6 %   Platelets 348 150 - 400 K/uL    Studies/Results: Dg Chest Port 1 View  Result Date: 10/09/2017 CLINICAL DATA:  Acute respiratory failure with hypoxia. EXAM: PORTABLE CHEST 1 VIEW COMPARISON:  10/08/2017 FINDINGS: Endotracheal tube terminates 4 cm above the carina. An ICD remains in place. Right PICC terminates over the high  right atrium. An enteric tube courses towards the left upper abdomen with tip not imaged. The cardiomediastinal silhouette is unchanged. Lung volumes remain diminished with mild pulmonary vascular congestion. Left greater than right basilar lung opacities are unchanged. There are persistent small left and possibly small right pleural effusions. No pneumothorax is identified. IMPRESSION: No change. Low lung volumes with left greater than right basilar opacity, likely atelectasis though pneumonia is possible on the left. Small pleural effusions. Electronically Signed   By: Sebastian Ache M.D.   On: 10/09/2017 07:29      Assessment: Upper GI bleed secondary to peptic ulcer disease. Improving.  Plan:   Continue medical therapy. Continue PPI. I do not think he needs another EGD at this time unless further bleeding occurs and hemoglobin and hematocrit drop. Hopefully he can be extubated in the next day or so and NG removed.    [name] 10/09/2017, 1:28 PM  Pager: [phone] If no answer or after 5 PM call [phone] Lab Results  Component Value Date   HGB 8.3 (L) 10/09/2017   HGB 8.3 (L) 10/08/2017   HGB 8.8 (L) 10/08/2017   HCT 25.9 (L) 10/09/2017   HCT 25.2 (L) 10/08/2017   HCT 27.0 (L) 10/08/2017   ALKPHOS 73 10/08/2017   ALKPHOS 81 10/07/2017   ALKPHOS 78 10/05/2017   AST 22 10/08/2017   AST 32 10/07/2017   AST 49 (H) 10/05/2017   ALT 19 10/08/2017   ALT 20 10/07/2017   ALT 24 10/05/2017

## 2017-10-10 ENCOUNTER — Inpatient Hospital Stay (HOSPITAL_COMMUNITY): Payer: PPO

## 2017-10-10 LAB — CBC WITH DIFFERENTIAL/PLATELET
BASOS ABS: 0.1 10*3/uL (ref 0.0–0.1)
BASOS PCT: 1 %
Eosinophils Absolute: 0.4 10*3/uL (ref 0.0–0.7)
Eosinophils Relative: 3 %
HEMATOCRIT: 28.2 % — AB (ref 39.0–52.0)
Hemoglobin: 9 g/dL — ABNORMAL LOW (ref 13.0–17.0)
Lymphocytes Relative: 5 %
Lymphs Abs: 0.8 10*3/uL (ref 0.7–4.0)
MCH: 30.6 pg (ref 26.0–34.0)
MCHC: 31.9 g/dL (ref 30.0–36.0)
MCV: 95.9 fL (ref 78.0–100.0)
MONO ABS: 1.2 10*3/uL — AB (ref 0.1–1.0)
Monocytes Relative: 8 %
NEUTROS ABS: 12.7 10*3/uL — AB (ref 1.7–7.7)
NEUTROS PCT: 83 %
Platelets: 370 10*3/uL (ref 150–400)
RBC: 2.94 MIL/uL — AB (ref 4.22–5.81)
RDW: 16 % — AB (ref 11.5–15.5)
WBC: 15.2 10*3/uL — ABNORMAL HIGH (ref 4.0–10.5)

## 2017-10-10 LAB — GLUCOSE, CAPILLARY
GLUCOSE-CAPILLARY: 111 mg/dL — AB (ref 65–99)
GLUCOSE-CAPILLARY: 151 mg/dL — AB (ref 65–99)
GLUCOSE-CAPILLARY: 141 mg/dL — AB (ref 65–99)
Glucose-Capillary: 132 mg/dL — ABNORMAL HIGH (ref 65–99)
Glucose-Capillary: 133 mg/dL — ABNORMAL HIGH (ref 65–99)
Glucose-Capillary: 200 mg/dL — ABNORMAL HIGH (ref 65–99)
Glucose-Capillary: 175 mg/dL — ABNORMAL HIGH (ref 65–99)

## 2017-10-10 LAB — BASIC METABOLIC PANEL
ANION GAP: 8 (ref 5–15)
BUN: 29 mg/dL — ABNORMAL HIGH (ref 6–20)
CALCIUM: 8.5 mg/dL — AB (ref 8.9–10.3)
CO2: 21 mmol/L — AB (ref 22–32)
Chloride: 116 mmol/L — ABNORMAL HIGH (ref 101–111)
Creatinine, Ser: 1.87 mg/dL — ABNORMAL HIGH (ref 0.61–1.24)
GFR, EST AFRICAN AMERICAN: 38 mL/min — AB (ref >60)
GFR, EST NON AFRICAN AMERICAN: 33 mL/min — AB (ref >60)
Glucose, Bld: 127 mg/dL — ABNORMAL HIGH (ref 65–99)
POTASSIUM: 4 mmol/L (ref 3.5–5.1)
Sodium: 145 mmol/L (ref 135–145)

## 2017-10-10 MED ORDER — LIP MEDEX EX OINT
TOPICAL_OINTMENT | CUTANEOUS | Status: AC
  Administered 2017-10-10: 22:00:00
  Filled 2017-10-10: qty 7

## 2017-10-10 MED ORDER — FUROSEMIDE 10 MG/ML IJ SOLN
20.0000 mg | Freq: Once | INTRAMUSCULAR | Status: AC
  Administered 2017-10-10: 20 mg via INTRAVENOUS
  Filled 2017-10-10: qty 2

## 2017-10-10 NOTE — Procedures (Signed)
Extubation Procedure Note  Patient Details:   Name: WILBURN KEIR DOB: [birth year] MRN: [id number]   Airway Documentation:  Airway 8 mm (Active)  Secured at (cm) 23 cm 10/10/2017  8:40 AM  Measured From Lips 10/10/2017  8:40 AM  Secured Location Center 10/10/2017  8:40 AM  Secured By [employer] 10/10/2017  8:40 AM  Tube Holder Repositioned Yes 10/10/2017  8:40 AM  Cuff Pressure (cm H2O) 28 cm H2O 10/10/2017  8:40 AM  Site Condition Dry 10/10/2017  8:40 AM    Evaluation  O2 sats: stable throughout Complications: No apparent complications Patient did tolerate procedure well. Bilateral Breath Sounds: Clear, Diminished  Placed on 2 lpm [place]. Pt able to speak Yes  Veryl Speak 10/10/2017, 11:39 AM

## 2017-10-10 NOTE — Progress Notes (Signed)
[facility] Progress Note  Subjective: No complaints today. No further signs of gastrointestinal bleeding.  Objective: Vital signs in last 24 hours: Temp:  [99 F (37.2 C)-99.9 F (37.7 C)] 99.9 F (37.7 C) (11/25 1400) Pulse Rate:  [63-100] 89 (11/25 1400) Resp:  [13-31] 17 (11/25 1400) BP: (149-182)/(44-71) 165/66 (11/25 1400) SpO2:  [95 %-100 %] 99 % (11/25 1400) FiO2 (%):  [30 %] 30 % (11/25 0840) Weight change:    PE:  No distress  Abdomen soft and nontender  Lab Results: Results for orders placed or performed during the hospital encounter of 09/27/17 (from the past 24 hour(s))  Glucose, capillary     Status: Abnormal   Collection Time: 10/09/17  7:58 PM  Result Value Ref Range   Glucose-Capillary 125 (H) 65 - 99 mg/dL   Comment 1 Notify RN    Comment 2 Document in Chart   Glucose, capillary     Status: Abnormal   Collection Time: 10/09/17 11:39 PM  Result Value Ref Range   Glucose-Capillary 132 (H) 65 - 99 mg/dL   Comment 1 Notify RN    Comment 2 Document in Chart   Basic metabolic panel     Status: Abnormal   Collection Time: 10/10/17  4:15 AM  Result Value Ref Range   Sodium 145 135 - 145 mmol/L   Potassium 4.0 3.5 - 5.1 mmol/L   Chloride 116 (H) 101 - 111 mmol/L   CO2 21 (L) 22 - 32 mmol/L   Glucose, Bld 127 (H) 65 - 99 mg/dL   BUN 29 (H) 6 - 20 mg/dL   Creatinine, Ser 8.50 (H) 0.61 - 1.24 mg/dL   Calcium 8.5 (L) 8.9 - 10.3 mg/dL   GFR calc non Af Amer 33 (L) >60 mL/min   GFR calc Af Amer 38 (L) >60 mL/min   Anion gap 8 5 - 15  CBC with Differential/Platelet     Status: Abnormal   Collection Time: 10/10/17  4:15 AM  Result Value Ref Range   WBC 15.2 (H) 4.0 - 10.5 K/uL   RBC 2.94 (L) 4.22 - 5.81 MIL/uL   Hemoglobin 9.0 (L) 13.0 - 17.0 g/dL   HCT 27.7 (L) 41.2 - 87.8 %   MCV 95.9 78.0 - 100.0 fL   MCH 30.6 26.0 - 34.0 pg   MCHC 31.9 30.0 - 36.0 g/dL   RDW 67.6 (H) 72.0 - 94.7 %   Platelets 370 150 - 400 K/uL   Neutrophils Relative %  83 %   Neutro Abs 12.7 (H) 1.7 - 7.7 K/uL   Lymphocytes Relative 5 %   Lymphs Abs 0.8 0.7 - 4.0 K/uL   Monocytes Relative 8 %   Monocytes Absolute 1.2 (H) 0.1 - 1.0 K/uL   Eosinophils Relative 3 %   Eosinophils Absolute 0.4 0.0 - 0.7 K/uL   Basophils Relative 1 %   Basophils Absolute 0.1 0.0 - 0.1 K/uL  Glucose, capillary     Status: Abnormal   Collection Time: 10/10/17  4:19 AM  Result Value Ref Range   Glucose-Capillary 111 (H) 65 - 99 mg/dL   Comment 1 Notify RN    Comment 2 Document in Chart   Glucose, capillary     Status: Abnormal   Collection Time: 10/10/17  8:39 AM  Result Value Ref Range   Glucose-Capillary 133 (H) 65 - 99 mg/dL   Comment 1 Notify RN    Comment 2 Document in Chart   Glucose, capillary  Status: Abnormal   Collection Time: 10/10/17 12:37 PM  Result Value Ref Range   Glucose-Capillary 151 (H) 65 - 99 mg/dL   Comment 1 Notify RN    Comment 2 Document in Chart     Studies/Results: Dg Chest Port 1 View  Result Date: 10/10/2017 CLINICAL DATA:  Acute respiratory acidosis. EXAM: PORTABLE CHEST 1 VIEW COMPARISON:  10/09/2017 FINDINGS: Endotracheal tube terminates 4.5 cm above the carina. ICD remains in place. Enteric tube courses into the stomach with tip not imaged. Right PICC terminates near the cavoatrial junction. The cardiac silhouette remains enlarged. Lung volumes remain diminished with mild central pulmonary vascular congestion. Asymmetric left basilar opacity has mildly improved. Minimal right basilar atelectasis is unchanged. There is likely a small persistent left pleural effusion. IMPRESSION: Improved left basilar aeration. Electronically Signed   By: Sebastian Ache M.D.   On: 10/10/2017 07:06      Assessment: GI bleed resolved. Do not plan further endoscopic procedures at this time.  Plan:   Continue supportive care. Would advance diet tomorrow to clear liquids and see how he does clinically. We will sign off at this point. Call us if  needed.    Gwenevere Abbot 10/10/2017, 3:17 PM  Pager: [phone] If no answer or after 5 PM call [phone] Lab Results  Component Value Date   HGB 9.0 (L) 10/10/2017   HGB 8.3 (L) 10/09/2017   HGB 8.3 (L) 10/08/2017   HCT 28.2 (L) 10/10/2017   HCT 25.9 (L) 10/09/2017   HCT 25.2 (L) 10/08/2017   ALKPHOS 73 10/08/2017   ALKPHOS 81 10/07/2017   ALKPHOS 78 10/05/2017   AST 22 10/08/2017   AST 32 10/07/2017   AST 49 (H) 10/05/2017   ALT 19 10/08/2017   ALT 20 10/07/2017   ALT 24 10/05/2017

## 2017-10-10 NOTE — Progress Notes (Signed)
PULMONARY / CRITICAL CARE MEDICINE   Name: [name] MRN: [id number] DOB: [birth year]    ADMISSION DATE:  09/27/2017   CHIEF COMPLAINT:  Left foot cellulitis and New GI Bleed   HISTORY OF PRESENT ILLNESS:  76 y.o. male with medical history of diabetes mellitus, coronary artery disease on plavix, paroxysmal atrial fibrillation on coumadin, CKD stage III, rheumatoid arthritis, ischemic cardiomyopathy status post ICD presented on 11/12 with 3-day history of increasing pain, edema, erythema about his left foot. He stepped on a nail that 2 weeks prior to this admission. The patient was soaking his foot in Epsom salts over the period of 2 weeks.   He visited emergency department on September 24, 2017.  X-rays were negative for any erosions.  The patient was sent home with ciprofloxacin and clindamycin.  However, over the past 2-3 days he has noted increasing edema, pain, and erythema and returned to the emergency department.   Patient was started on broad spectrum abx and switched to oral doxycycline but his cellulitis got worse and was restarted on IV abx. During this admission patient also developed dark bloody bowel movements with subsequent hypotension (11/16). He was given multiple units of blood and FFP for coumadin reversal.   Patient was planned for EGD on 11/20. He was given sedatives which caused patient to become hypoxic and cyanotic. He was subsequently intubated. Patient was also evaluated by surgery for his foot who recommended patient be transferred to [facility] for vascular surgery evaluation for possible intervention due to occlusive vascular disease being found in workup.    SUBJECTIVE:   No bleeding No egd done or planned   VITAL SIGNS: BP (!) 164/71   Pulse 79   Temp 99 F (37.2 C)   Resp (!) 23   Ht 5\' 8"  (1.727 m)   Wt 122.9 kg (270 lb 15.1 oz)   SpO2 98%   BMI 41.20 kg/m   HEMODYNAMICS:    VENTILATOR SETTINGS: Vent Mode: PRVC FiO2 (%):  [30 %] 30 % Set Rate:   [16 bmp] 16 bmp Vt Set:  [550 mL] 550 mL PEEP:  [5 cmH20] 5 cmH20 Plateau Pressure:  [16 cmH20-18 cmH20] 16 cmH20  INTAKE / OUTPUT: I/O last 3 completed shifts: In: 3176.8 [I.V.:2776.8; IV Piggyback:400] Out: 4610 [Urine:3310; Emesis/NG output:1300]  PHYSICAL EXAMINATION: General: awakens, rass 0 FC Neuro: rass 0  HEENT: obese, beard PULM: coarse CV:  s1 s2 RRR GI: sof, BS low, no rg, OGT bile no bleeding Extremities: edema    LABS:  BMET Recent Labs  Lab 10/08/17 0500 10/09/17 0413 10/10/17 0415  NA 142 144 145  K 4.0 4.1 4.0  CL 116* 115* 116*  CO2 21* 21* 21*  BUN 36* 32* 29*  CREATININE 1.99* 1.98* 1.87*  GLUCOSE 117* 119* 127*    Electrolytes Recent Labs  Lab 10/05/17 2213  10/08/17 0500 10/09/17 0413 10/10/17 0415  CALCIUM 8.5*   < > 8.2* 8.4* 8.5*  MG 2.2  --   --   --   --   PHOS 2.8  --   --   --   --    < > = values in this interval not displayed.    CBC Recent Labs  Lab 10/08/17 2256 10/09/17 1059 10/10/17 0415  WBC 12.9* 14.1* 15.2*  HGB 8.3* 8.3* 9.0*  HCT 25.2* 25.9* 28.2*  PLT 341 348 370    Coag's Recent Labs  Lab 10/04/17 0451 10/05/17 0624 10/05/17 2213  APTT  --   --  44*  INR 4.52* 1.35 1.38    Sepsis Markers Recent Labs  Lab 10/06/17 0445  LATICACIDVEN 0.6    ABG Recent Labs  Lab 10/05/17 1543  PHART 7.332*  PCO2ART 40.7  PO2ART 299*    Liver Enzymes Recent Labs  Lab 10/05/17 2213 10/07/17 0328 10/08/17 0500  AST 49* 32 22  ALT 24 20 19   ALKPHOS 78 81 73  BILITOT 0.7 0.6 0.9  ALBUMIN 2.0* 2.0* 1.9*    Cardiac Enzymes No results for input(s): TROPONINI, PROBNP in the last 168 hours.  Glucose Recent Labs  Lab 10/09/17 0744 10/09/17 1507 10/09/17 1958 10/09/17 2339 10/10/17 0419 10/10/17 0839  GLUCAP 125* 125* 125* 132* 111* 133*    Imaging Dg Chest Port 1 View  Result Date: 10/10/2017 CLINICAL DATA:  Acute respiratory acidosis. EXAM: PORTABLE CHEST 1 VIEW COMPARISON:  10/09/2017  FINDINGS: Endotracheal tube terminates 4.5 cm above the carina. ICD remains in place. Enteric tube courses into the stomach with tip not imaged. Right PICC terminates near the cavoatrial junction. The cardiac silhouette remains enlarged. Lung volumes remain diminished with mild central pulmonary vascular congestion. Asymmetric left basilar opacity has mildly improved. Minimal right basilar atelectasis is unchanged. There is likely a small persistent left pleural effusion. IMPRESSION: Improved left basilar aeration. Electronically Signed   By: 10/11/2017 M.D.   On: 10/10/2017 07:06     STUDIES:  ABI 11/19 >> R ABI in the moderate range of arterial occlusive disease, L non-obtainable ABI with proximal occlusive disease EGD 11/21 >> large clot in gastric covering most of the gastric body, can not fully evaluated gastric body due to clot  EGD 11/22 >> very large clot in the fundus & gastric body, clean based ulcers/erosions, unable to visualize bleeding source due to clot egd 11/24>>>not done or recommended  CULTURES: MRSA 11/19 >> negative  BCx2 11/12 >> negative   ANTIBIOTICS: Vancomycin 11/12 >> 11/23 Zosyn 11/12 >> 11/23 Ancef 11/23 >>   SIGNIFICANT EVENTS: 11/12  Admit to [facility]  11/20  Transfer to [facility] after decompensation during EGD, intubation  11/22  EGD - clot larges on mtach.  Repeat egd same.  Remains on vent  LINES/TUBES: PICC RUE 11/14 >>  ETT 11/21>>>  DISCUSSION: 76 year old male with many cormorbidites admitted with left LLE cellulitis, developed GIB and decompensated during EGD requiring intubation.  Tx to [facility] on 11/21 for VVS & GI evaluation.   ASSESSMENT / PLAN:  PULMONARY A:  Acute Hypoxic Respiratory Failure - decompensated in setting of GIB/anemia and sedation Pulmonary Nodule   Former Smoker - remote, quit in 1966 Left effusion, edema P:   Neg balance has improved LLL Wean aggressive cpap 5 ps5, goal 1967 as no egd planned and OKAY by GI Maintain neg  balance as renal tolerates  CARDIOVASCULAR A:  Hypotension secondary to hemorraghic shock vs septic shock (doubt septic) PVD  Hx of paroxysmal a fib, CAD, ischemic cardiomyopathy s/p ICD, LBBB P:  paced Hold home Coumadin, Plavix given GI bleed VVS following > will ultimately need to be transferred to [facility] for foot eval Lasix   RENAL A:   CKD III Hyperchloremia P:   Avoid saline with Cl KVO IV fluids Lasix repeat dos same today  GASTROINTESTINAL A:   Acute GIB - on coumadin and Plavix, EGD with concern for gastric ulcers with large clot P:   GI following, no plan EGD Continue PPI gtt, maintain, consider to bid in am   HEMATOLOGIC A:  Acute Blood anemia secondary to Upper GI Bleed  Supratheraputic INR secondary to coumadin s/p FFP P:  Trend CBC Q12  Then to daily in am if stable Follow PT/INR - wnl Hold home Coumadin and Plavix  INFECTIOUS A:   LLE Wound   P:   MRI on hold due to pacemaker /orthopedic hardware ancef Follow vascular recs  ENDOCRINE A:   DM II Hypothyroidism P:   SSI - low needs Continue Synthroid, changed to IV dosing  NEUROLOGIC A:   Vent dyschrony P:   RASS goal: 0  Propofol for sedation- hold Daily WUA  CC Time: 30 minutes  Iudpated wife daily  Mcarthur Rossetti. Tyson Alias, MD, FACP Pgr: [phone] [place] [facility]

## 2017-10-11 ENCOUNTER — Encounter (HOSPITAL_COMMUNITY): Payer: Self-pay | Admitting: Gastroenterology

## 2017-10-11 LAB — GLUCOSE, CAPILLARY
Glucose-Capillary: 168 mg/dL — ABNORMAL HIGH (ref 65–99)
Glucose-Capillary: 152 mg/dL — ABNORMAL HIGH (ref 65–99)
Glucose-Capillary: 181 mg/dL — ABNORMAL HIGH (ref 65–99)
Glucose-Capillary: 154 mg/dL — ABNORMAL HIGH (ref 65–99)
Glucose-Capillary: 161 mg/dL — ABNORMAL HIGH (ref 65–99)

## 2017-10-11 LAB — BASIC METABOLIC PANEL
Anion gap: 5 (ref 5–15)
BUN: 29 mg/dL — AB (ref 6–20)
CALCIUM: 8.5 mg/dL — AB (ref 8.9–10.3)
CHLORIDE: 112 mmol/L — AB (ref 101–111)
CO2: 22 mmol/L (ref 22–32)
CREATININE: 1.62 mg/dL — AB (ref 0.61–1.24)
GFR calc non Af Amer: 39 mL/min — ABNORMAL LOW (ref >60)
GFR, EST AFRICAN AMERICAN: 46 mL/min — AB (ref >60)
GLUCOSE: 157 mg/dL — AB (ref 65–99)
Potassium: 4.2 mmol/L (ref 3.5–5.1)
Sodium: 139 mmol/L (ref 135–145)

## 2017-10-11 LAB — TRIGLYCERIDES: Triglycerides: 289 mg/dL — ABNORMAL HIGH (ref <150)

## 2017-10-11 MED ORDER — ORAL CARE MOUTH RINSE
15.0000 mL | Freq: Two times a day (BID) | OROMUCOSAL | Status: DC
  Administered 2017-10-12 – 2017-10-15 (×5): 15 mL via OROMUCOSAL

## 2017-10-11 MED ORDER — PANTOPRAZOLE SODIUM 40 MG IV SOLR
40.0000 mg | Freq: Two times a day (BID) | INTRAVENOUS | Status: DC
Start: 2017-10-11 — End: 2017-10-13
  Administered 2017-10-11 – 2017-10-12 (×3): 40 mg via INTRAVENOUS
  Filled 2017-10-11 (×3): qty 40

## 2017-10-11 MED ORDER — SODIUM CHLORIDE 0.9 % IV SOLN: INTRAVENOUS | Status: DC

## 2017-10-11 MED ORDER — PREMIER PROTEIN SHAKE
11.0000 [oz_av] | ORAL | Status: DC
  Administered 2017-10-11: 11 [oz_av] via ORAL
  Filled 2017-10-11 (×5): qty 325.31

## 2017-10-11 NOTE — Progress Notes (Signed)
PULMONARY / CRITICAL CARE MEDICINE   Name: [name] MRN: [id number] DOB: [birth year]    ADMISSION DATE:  09/27/2017   CHIEF COMPLAINT:  Left foot cellulitis and New GI Bleed   HISTORY OF PRESENT ILLNESS:  76 y.o. male with medical history of diabetes mellitus, coronary artery disease on plavix, paroxysmal atrial fibrillation on coumadin, CKD stage III, rheumatoid arthritis, ischemic cardiomyopathy status post ICD presented on 11/12 with 3-day history of increasing pain, edema, erythema about his left foot. He stepped on a nail that 2 weeks prior to this admission. The patient was soaking his foot in Epsom salts over the period of 2 weeks.   He visited emergency department on September 24, 2017.  X-rays were negative for any erosions.  The patient was sent home with ciprofloxacin and clindamycin.  However, over the past 2-3 days he has noted increasing edema, pain, and erythema and returned to the emergency department.   Patient was started on broad spectrum abx and switched to oral doxycycline but his cellulitis got worse and was restarted on IV abx. During this admission patient also developed dark bloody bowel movements with subsequent hypotension (11/16). He was given multiple units of blood and FFP for coumadin reversal.   Patient was planned for EGD on 11/20. He was given sedatives which caused patient to become hypoxic and cyanotic. He was subsequently intubated. Patient was also evaluated by surgery for his foot who recommended patient be transferred to [facility] for vascular surgery evaluation for possible intervention due to occlusive vascular disease being found in workup.    SUBJECTIVE:   extubated   VITAL SIGNS: BP (!) 162/77 (BP Location: Left Arm)   Pulse 89   Temp 99.3 F (37.4 C) (Core (Comment))   Resp 17   Ht 5\' 8"  (1.727 m)   Wt 116.3 kg (256 lb 6.3 oz)   SpO2 100%   BMI 38.98 kg/m   HEMODYNAMICS:    VENTILATOR SETTINGS:    INTAKE / OUTPUT: I/O last 3  completed shifts: In: 1969.6 [P.O.:720; I.V.:949.6; IV Piggyback:300] Out: 3610 [Urine:2560; Emesis/NG output:1050]  PHYSICAL EXAMINATION: General: no distress Neuro: awake, O x 4, calm HEENT: jvd wnl, obese PULM: CTA CV:  s1 s2 RRR GI: soft, BS no r Extremities: min edema, no changes to escar left anterior foot    LABS:  BMET Recent Labs  Lab 10/09/17 0413 10/10/17 0415 10/11/17 0451  NA 144 145 139  K 4.1 4.0 4.2  CL 115* 116* 112*  CO2 21* 21* 22  BUN 32* 29* 29*  CREATININE 1.98* 1.87* 1.62*  GLUCOSE 119* 127* 157*    Electrolytes Recent Labs  Lab 10/05/17 2213  10/09/17 0413 10/10/17 0415 10/11/17 0451  CALCIUM 8.5*   < > 8.4* 8.5* 8.5*  MG 2.2  --   --   --   --   PHOS 2.8  --   --   --   --    < > = values in this interval not displayed.    CBC Recent Labs  Lab 10/08/17 2256 10/09/17 1059 10/10/17 0415  WBC 12.9* 14.1* 15.2*  HGB 8.3* 8.3* 9.0*  HCT 25.2* 25.9* 28.2*  PLT 341 348 370    Coag's Recent Labs  Lab 10/05/17 0624 10/05/17 2213  APTT  --  44*  INR 1.35 1.38    Sepsis Markers Recent Labs  Lab 10/06/17 0445  LATICACIDVEN 0.6    ABG Recent Labs  Lab 10/05/17 1543  PHART 7.332*  PCO2ART 40.7  PO2ART 299*    Liver Enzymes Recent Labs  Lab 10/05/17 2213 10/07/17 0328 10/08/17 0500  AST 49* 32 22  ALT 24 20 19   ALKPHOS 78 81 73  BILITOT 0.7 0.6 0.9  ALBUMIN 2.0* 2.0* 1.9*    Cardiac Enzymes No results for input(s): TROPONINI, PROBNP in the last 168 hours.  Glucose Recent Labs  Lab 10/10/17 0839 10/10/17 1237 10/10/17 1613 10/10/17 1941 10/10/17 2319 10/11/17 0723  GLUCAP 133* 151* 141* 200* 175* 168*    Imaging No results found.   STUDIES:  ABI 11/19 >> R ABI in the moderate range of arterial occlusive disease, L non-obtainable ABI with proximal occlusive disease EGD 11/21 >> large clot in gastric covering most of the gastric body, can not fully evaluated gastric body due to clot  EGD 11/22  >> very large clot in the fundus & gastric body, clean based ulcers/erosions, unable to visualize bleeding source due to clot egd 11/24>>>not done or recommended  CULTURES: MRSA 11/19 >> negative  BCx2 11/12 >> negative   ANTIBIOTICS: Vancomycin 11/12 >> 11/23 Zosyn 11/12 >> 11/23 Ancef 11/23 >>   SIGNIFICANT EVENTS: 11/12  Admit to [facility]  11/20  Transfer to [facility] after decompensation during EGD, intubation  11/22  EGD - clot larges on mtach.  Repeat egd same.  Remains on vent  LINES/TUBES: PICC RUE 11/14 >>  ETT 11/21>>>11/25  DISCUSSION: 76 year old male with many cormorbidites admitted with left LLE cellulitis, developed GIB and decompensated during EGD requiring intubation.  Tx to [facility] on 11/21 for VVS & GI evaluation.   ASSESSMENT / PLAN:  PULMONARY A:  Acute Hypoxic Respiratory Failure - decompensated in setting of GIB/anemia and sedation Pulmonary Nodule   Former Smoker - remote, quit in 1966 Left effusion, edema P:   IS Upright Mobilize  CARDIOVASCULAR A:  Hypotension secondary to hemorraghic shock vs septic shock (doubt septic) PVD  Hx of paroxysmal a fib, CAD, ischemic cardiomyopathy s/p ICD, LBBB P:  paced Hold home Coumadin, Plavix given GI bleed VVS following > will call vascualr Lasix , was neg 1 liter RENAL A:   CKD III Hyperchloremia P:   Avoid saline with Cl KVO IV fluids Lasix with improving renal fxn, hold as on min O2 now  GASTROINTESTINAL A:   Acute GIB - on coumadin and Plavix, EGD with concern for gastric ulcers with large clot P:   GI following, no plan EGD, diet per GI Continue PPI gtt, maintain, consider to bid , off drip  HEMATOLOGIC A:   Acute Blood anemia secondary to Upper GI Bleed  Supratheraputic INR secondary to coumadin s/p FFP Some hemoconcetration P:  Cbc to daily  INFECTIOUS A:   LLE Wound   P:   MRI on hold due to pacemaker /orthopedic hardware ancef Will call vascualr  ENDOCRINE A:   DM  II Hypothyroidism P:   SSI - low needs Continue Synthroid,consider to po  NEUROLOGIC A:   Vent dyschrony P:   pt  Updated pt  1967. Mcarthur Rossetti, MD, FACP Pgr: [phone] [place] [facility]

## 2017-10-11 NOTE — Progress Notes (Signed)
No ICM remote transmission received for 10/04/2017 and next ICM transmission scheduled for 11/02/2017.

## 2017-10-11 NOTE — Progress Notes (Signed)
Nutrition Follow-up  DOCUMENTATION CODES:   Obesity unspecified  INTERVENTION:  - Will order Magic Cup with lunch each day, this supplement provides 290 kcal and 9 grams of protein - Will order Premier Protein once/day, this supplement provides 160 kcal and 30 grams of protein.  - Continue to encourage PO intakes. - Further diet advancement as medically feasible.   NUTRITION DIAGNOSIS:   Inadequate oral intake related to acute illness, decreased appetite as evidenced by per patient/family report, other (comment)(recent diet advancement). -revised  GOAL:   Patient will meet greater than or equal to 90% of their needs -unmet  MONITOR:   PO intake, Supplement acceptance, Diet advancement, Weight trends, Labs  ASSESSMENT:   76 y/o male PMHx DM2, CAD, AFIB, CKD3, RA, HF, s/p ICD, MI, HLD/HTN. Had stepped on nail w/ L foot 2 weeks prior to admission. Visited ED 11/8 and placed on abx. Represented to hospital 11/12 after 3 days of increasing pain, edema, erythema to left foot. Admitted for L foot infection refractory to outpatient oral abx. RD consulted for assessment on Day 8 of admission.   11/26 Per Dr. Gwendolyn Grant note yesterday AM, plan was for EGD on 11/20, but became hypoxic and cyanotic after being given sedatives and was subsequently intubated. OGT was placed on 11/21. Pt was extubated and OGT removed yesterday around 11:40 AM. Estimated nutrition needs updated based on this event.   Diet advanced from NPO to FLD yesterday evening with no documented intakes since that time. Pt states he has mainly been drinking ginger ale and that he had some ice cream last night. He denies abdominal pain or N/V with or without intakes. He has been experiencing a sore throat since extubation but reports that this is improving.   Medications reviewed; 20 mg IV Lasix x1 dose yesterday, sliding scale Novolog, 25 mcg IV Synthroid/day, 40 mg IV Protonix BID.  Labs reviewed; CBG: 168 mg/dL this AM, Cl:  741 mmol/L, BUN: 29 mg/dL, creatinine: 4.23 mg/dL, Ca: 8.5 mg/dL, GFR: 39 mL/min.      95/32 - Patient's cellulitis has improved. - Early yesterday the patient developed hematemesis and hematochezia requiring emergent blood transfusion.  - Now NPO w/ planned EGD this am.  - Pt lethargic on RD arrival. His wife is at bedside and provides all information.  - Pt has eaten very poorly since admission and wife attributes this to the IV abx.  - He has no appetite and has altered taste since starting abx.  -  From what information is available, He has eaten </=50% of his meals since 11/13.  - At home, the patient was taking vitamin D3, zinc, and essential multivitamin.  - He followed a DM diet and "stayed on top of his blood sugars".  - He did not have any n/v/c/d and had a good appetite PTA. - Patient was admitted at weight of ~248 lbs. Wife says the pt "stays around there".  - Wt stable this year at 248-253 lbs. Bed weight today was 266 lbs.  - Likely wt gain related to fluid retention. Has slight generalized swelling.      Diet Order:  Diet full liquid Room service appropriate? Yes; Fluid consistency: Thin  EDUCATION NEEDS:   No education needs have been identified at this time  Skin:  Skin Assessment: Reviewed RN Assessment  Last BM:  11/24  Height:   Ht Readings from Last 1 Encounters:  10/05/17 5\' 8"  (1.727 m)    Weight:   Wt Readings from Last  1 Encounters:  10/11/17 256 lb 6.3 oz (116.3 kg)    Ideal Body Weight:  70 kg  BMI:  Body mass index is 38.98 kg/m.  Estimated Nutritional Needs:   Kcal:  1695-1920 (15-17 kcal/kg)  Protein:  100-115 grams  Fluid:  >/= 1.5 L/day     Trenton Gammon, MS, RD, LDN, [facility] Inpatient Clinical Dietitian Pager # [phone] After hours/weekend pager # [phone]

## 2017-10-11 NOTE — Progress Notes (Signed)
Physical Therapy Treatment/RE-EVAL Patient Details Name: [name] MRN: [id number] DOB: June 21, 1941 Today's Date: 10/11/2017    History of Present Illness 76 year old male with many cormorbidites admitted with left LLE cellulitis, developed GIB and decompensated during EGD requiring intubation.  Tx to [facility] on 11/21 for VVS & GI evaluation. PMH: LBBB, cardiomyopathy, bil TKAs, anemia    PT Comments    Pt admitted with above diagnosis. Pt currently with functional limitations due to the deficits listed below (see PT Problem List).  Pt will benefit from skilled PT to increase their independence and safety with mobility to allow discharge to the venue listed below.    Pt requiring +2 for sit to stand and stand pivot transfers, VSS during therapy; will need SNF post acute; PT will continue to follow in acute setting   Follow Up Recommendations  SNF;Supervision/Assistance - 24 hour     Equipment Recommendations  None recommended by PT    Recommendations for Other Services       Precautions / Restrictions Precautions Precautions: Fall Restrictions Weight Bearing Restrictions: No    Mobility  Bed Mobility Overal bed mobility: Needs Assistance Bed Mobility: Supine to Sit     Supine to sit: +2 for physical assistance;+2 for safety/equipment;Max assist     General bed mobility comments: HOB elevated, +2 assist to bring trunk forward and LEs off bed; incr time   Transfers Overall transfer level: Needs assistance Equipment used: Rolling walker (2 wheeled);None Transfers: Sit to/from [employer] Sit to Stand: +2 safety/equipment;+2 physical assistance;Max assist Stand pivot transfers: Max assist;+2 safety/equipment;+2 physical assistance       General transfer comment: multi-modal cues for trunk/hip extension, hand placement and self assist  Ambulation/Gait                 Stairs            Wheelchair Mobility    Modified Rankin (Stroke  Patients Only)       Balance Overall balance assessment: Needs assistance Sitting-balance support: No upper extremity supported;Feet supported Sitting balance-Leahy Scale: Fair       Standing balance-Leahy Scale: Zero                              Cognition Arousal/Alertness: Awake/alert Behavior During Therapy: WFL for tasks assessed/performed Overall Cognitive Status: Impaired/Different from baseline Area of Impairment: Following commands                       Following Commands: Follows one step commands with increased time;Follows multi-step commands inconsistently              Exercises      General Comments        Pertinent Vitals/Pain Pain Assessment: Faces Faces Pain Scale: Hurts little more Pain Location: LUE Pain Descriptors / Indicators: Grimacing;Guarding Pain Intervention(s): Monitored during session;Repositioned(elevated LUE, encouraged ROM)    Home Living Family/patient expects to be discharged to:: Unsure Living Arrangements: Spouse/significant other Available Help at Discharge: Family Type of Home: House Home Access: Stairs to enter Entrance Stairs-Rails: None Home Layout: One level Home Equipment: Environmental consultant - 2 wheels;Transport chair;Shower seat;Cane - single point      Prior Function Level of Independence: Independent      Comments: patient enjoys going dancing 2x/week and works 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 : Unable Difficulty sitting down on and standing up from a chair with arms (e.g., wheelchair, bedside commode, etc,.)?: Unable Help needed moving to and from a bed to chair (including a wheelchair)?: Total Help needed walking in hospital room?: Total Help needed climbing 3-5 steps with a railing? : Total 6 Click Score: 6    End of Session Equipment Utilized During Treatment: Gait belt Activity Tolerance: Patient tolerated treatment well Patient left: in chair;with nursing/sitter in room;with call bell/phone within reach   PT Visit Diagnosis: Muscle weakness (generalized) (M62.81);Difficulty in walking, not elsewhere classified (R26.2)     Time: 6644-0347 PT Time Calculation (min) (ACUTE ONLY): 33 min  Charges:  $Therapeutic Activity: 8-22 mins                    G CodesDrucilla Chalet, PT Pager: [phone] 10/11/2017    [facility] 10/11/2017, 10:41 AM

## 2017-10-11 NOTE — Progress Notes (Signed)
Case discussed with Dr. Arbie Cookey > will transfer to [facility] for further evaluation of LLE.  Plan for limited contrast arteriogram in the am 11/27.  NPO after MN.    Canary Brim, NP-C [facility] Pgr: [phone] or if no answer [phone] 10/11/2017, 12:51 PM

## 2017-10-12 ENCOUNTER — Encounter (HOSPITAL_COMMUNITY)
Admission: EM | Disposition: A | Discharge status: Skilled Nursing Facility | Payer: Self-pay | Source: Home / Self Care | Attending: Pulmonary Disease

## 2017-10-12 ENCOUNTER — Telehealth: Payer: Self-pay | Admitting: Surgery

## 2017-10-12 DIAGNOSIS — L8990 Pressure ulcer of unspecified site, unspecified stage: Secondary | ICD-10-CM

## 2017-10-12 HISTORY — PX: PERIPHERAL VASCULAR ATHERECTOMY: CATH118256

## 2017-10-12 HISTORY — PX: LOWER EXTREMITY ANGIOGRAPHY: CATH118251

## 2017-10-12 LAB — BASIC METABOLIC PANEL
ANION GAP: 8 (ref 5–15)
BUN: 25 mg/dL — ABNORMAL HIGH (ref 6–20)
CHLORIDE: 108 mmol/L (ref 101–111)
CO2: 18 mmol/L — AB (ref 22–32)
Calcium: 8.3 mg/dL — ABNORMAL LOW (ref 8.9–10.3)
Creatinine, Ser: 1.72 mg/dL — ABNORMAL HIGH (ref 0.61–1.24)
GFR calc non Af Amer: 37 mL/min — ABNORMAL LOW (ref >60)
GFR, EST AFRICAN AMERICAN: 42 mL/min — AB (ref >60)
Glucose, Bld: 172 mg/dL — ABNORMAL HIGH (ref 65–99)
Potassium: 4.1 mmol/L (ref 3.5–5.1)
Sodium: 134 mmol/L — ABNORMAL LOW (ref 135–145)

## 2017-10-12 LAB — CBC
HEMATOCRIT: 30.7 % — AB (ref 39.0–52.0)
HEMOGLOBIN: 10 g/dL — AB (ref 13.0–17.0)
MCH: 31 pg (ref 26.0–34.0)
MCHC: 32.6 g/dL (ref 30.0–36.0)
MCV: 95 fL (ref 78.0–100.0)
PLATELETS: 348 10*3/uL (ref 150–400)
RBC: 3.23 MIL/uL — AB (ref 4.22–5.81)
RDW: 15.5 % (ref 11.5–15.5)
WBC: 14.5 10*3/uL — ABNORMAL HIGH (ref 4.0–10.5)

## 2017-10-12 LAB — URINALYSIS, ROUTINE W REFLEX MICROSCOPIC
BILIRUBIN URINE: NEGATIVE
Glucose, UA: NEGATIVE mg/dL
KETONES UR: 5 mg/dL — AB
NITRITE: NEGATIVE
PH: 5 (ref 5.0–8.0)
Protein, ur: 100 mg/dL — AB
SPECIFIC GRAVITY, URINE: 1.024 (ref 1.005–1.030)

## 2017-10-12 LAB — GLUCOSE, CAPILLARY
GLUCOSE-CAPILLARY: 164 mg/dL — AB (ref 65–99)
GLUCOSE-CAPILLARY: 178 mg/dL — AB (ref 65–99)
GLUCOSE-CAPILLARY: 181 mg/dL — AB (ref 65–99)
GLUCOSE-CAPILLARY: 182 mg/dL — AB (ref 65–99)
Glucose-Capillary: 116 mg/dL — ABNORMAL HIGH (ref 65–99)
Glucose-Capillary: 161 mg/dL — ABNORMAL HIGH (ref 65–99)
Glucose-Capillary: 192 mg/dL — ABNORMAL HIGH (ref 65–99)

## 2017-10-12 LAB — POCT ACTIVATED CLOTTING TIME
ACTIVATED CLOTTING TIME: 175 s
Activated Clotting Time: 252 seconds
Activated Clotting Time: 224 seconds
Activated Clotting Time: 191 seconds

## 2017-10-12 LAB — PROTIME-INR
INR: 1.35
Prothrombin Time: 16.5 seconds — ABNORMAL HIGH (ref 11.4–15.2)

## 2017-10-12 LAB — PROCALCITONIN: PROCALCITONIN: 0.41 ng/mL

## 2017-10-12 SURGERY — LOWER EXTREMITY ANGIOGRAPHY
Anesthesia: LOCAL

## 2017-10-12 MED ORDER — SODIUM CHLORIDE 0.9 % IV SOLN
INTRAVENOUS | Status: DC | PRN
  Administered 2017-10-12: 13:00:00 via SURGICAL_CAVITY

## 2017-10-12 MED ORDER — ACETAMINOPHEN 325 MG PO TABS: 650.0000 mg | ORAL_TABLET | ORAL | Status: DC | PRN

## 2017-10-12 MED ORDER — LABETALOL HCL 5 MG/ML IV SOLN
INTRAVENOUS | Status: AC
  Filled 2017-10-12: qty 4

## 2017-10-12 MED ORDER — HEPARIN SODIUM (PORCINE) 1000 UNIT/ML IJ SOLN
INTRAMUSCULAR | Status: AC
  Filled 2017-10-12: qty 1

## 2017-10-12 MED ORDER — SODIUM CHLORIDE 0.9 % IV SOLN: 250.0000 mL | INTRAVENOUS | Status: DC | PRN

## 2017-10-12 MED ORDER — HEPARIN SODIUM (PORCINE) 1000 UNIT/ML IJ SOLN
INTRAMUSCULAR | Status: DC | PRN
  Administered 2017-10-12: 11000 [IU] via INTRAVENOUS

## 2017-10-12 MED ORDER — HEPARIN (PORCINE) IN NACL 2-0.9 UNIT/ML-% IJ SOLN
INTRAMUSCULAR | Status: AC | PRN
  Administered 2017-10-12: 1000 mL

## 2017-10-12 MED ORDER — SODIUM CHLORIDE 0.9% FLUSH: 3.0000 mL | INTRAVENOUS | Status: DC | PRN

## 2017-10-12 MED ORDER — CLOPIDOGREL BISULFATE 75 MG PO TABS
75.0000 mg | ORAL_TABLET | Freq: Every day | ORAL | Status: DC
  Administered 2017-10-13 – 2017-10-15 (×3): 75 mg via ORAL
  Filled 2017-10-12 (×3): qty 1

## 2017-10-12 MED ORDER — LABETALOL HCL 5 MG/ML IV SOLN
10.0000 mg | INTRAVENOUS | Status: DC | PRN
  Administered 2017-10-12: 10 mg via INTRAVENOUS

## 2017-10-12 MED ORDER — SODIUM CHLORIDE 0.9% FLUSH
3.0000 mL | Freq: Two times a day (BID) | INTRAVENOUS | Status: DC
  Administered 2017-10-13 – 2017-10-14 (×2): 3 mL via INTRAVENOUS

## 2017-10-12 MED ORDER — CLOPIDOGREL BISULFATE 75 MG PO TABS
300.0000 mg | ORAL_TABLET | Freq: Once | ORAL | Status: AC
  Administered 2017-10-12: 300 mg via ORAL

## 2017-10-12 MED ORDER — HEPARIN (PORCINE) IN NACL 2-0.9 UNIT/ML-% IJ SOLN
INTRAMUSCULAR | Status: AC
  Filled 2017-10-12: qty 1000

## 2017-10-12 MED ORDER — FLUCONAZOLE 100 MG PO TABS
100.0000 mg | ORAL_TABLET | Freq: Every day | ORAL | Status: DC
  Administered 2017-10-12 – 2017-10-15 (×4): 100 mg via ORAL
  Filled 2017-10-12 (×4): qty 1

## 2017-10-12 MED ORDER — LIDOCAINE HCL (PF) 1 % IJ SOLN
INTRAMUSCULAR | Status: AC
  Filled 2017-10-12: qty 30

## 2017-10-12 MED ORDER — ACETAMINOPHEN 325 MG PO TABS
650.0000 mg | ORAL_TABLET | Freq: Four times a day (QID) | ORAL | Status: DC | PRN
  Administered 2017-10-12: 650 mg via ORAL
  Filled 2017-10-12: qty 2

## 2017-10-12 MED ORDER — LIDOCAINE HCL (PF) 1 % IJ SOLN
INTRAMUSCULAR | Status: DC | PRN
  Administered 2017-10-12: 15 mL

## 2017-10-12 MED ORDER — CLOPIDOGREL BISULFATE 300 MG PO TABS
ORAL_TABLET | ORAL | Status: AC
  Filled 2017-10-12: qty 1

## 2017-10-12 MED ORDER — SODIUM CHLORIDE 0.9 % WEIGHT BASED INFUSION
1.0000 mL/kg/h | INTRAVENOUS | Status: AC
  Administered 2017-10-12: 1 mL/kg/h via INTRAVENOUS

## 2017-10-12 MED ORDER — IODIXANOL 320 MG/ML IV SOLN
INTRAVENOUS | Status: DC | PRN
  Administered 2017-10-12: 19 mL via INTRAVENOUS

## 2017-10-12 MED ORDER — ONDANSETRON HCL 4 MG/2ML IJ SOLN: 4.0000 mg | Freq: Four times a day (QID) | INTRAMUSCULAR | Status: DC | PRN

## 2017-10-12 MED ORDER — HYDRALAZINE HCL 20 MG/ML IJ SOLN: 5.0000 mg | INTRAMUSCULAR | Status: DC | PRN

## 2017-10-12 MED ORDER — GERHARDT'S BUTT CREAM
TOPICAL_CREAM | Freq: Three times a day (TID) | CUTANEOUS | Status: DC
  Administered 2017-10-12 – 2017-10-14 (×6): via TOPICAL
  Administered 2017-10-15: 1 via TOPICAL
  Filled 2017-10-12: qty 1

## 2017-10-12 SURGICAL SUPPLY — 32 items
BALLN LUTONIX DCB 5X100X130 (BALLOONS) ×3
BALLN LUTONIX DCB 6X40X130 (BALLOONS) ×3
BALLOON LUTONIX DCB 5X100X130 (BALLOONS) ×2 IMPLANT
BALLOON LUTONIX DCB 6X40X130 (BALLOONS) ×2 IMPLANT
BUR JETSTREAM XC 2.1/3.0 (BURR) ×2 IMPLANT
BURR JETSTREAM XC 2.1/3.0 (BURR) ×3
CATH OMNI FLUSH 5F 65CM (CATHETERS) ×3 IMPLANT
CATH QUICKCROSS .035X135CM (MICROCATHETER) ×3 IMPLANT
CATH SOFT-VU 4F 65 STRAIGHT (CATHETERS) ×2 IMPLANT
CATH SOFT-VU STRAIGHT 4F 65CM (CATHETERS) ×1
COVER PRB 48X5XTLSCP FOLD TPE (BAG) ×2 IMPLANT
COVER PROBE 5X48 (BAG) ×1
DEVICE CONTINUOUS FLUSH (MISCELLANEOUS) ×3 IMPLANT
DEVICE EMBOSHIELD NAV6 4.0-7.0 (WIRE) ×3 IMPLANT
DEVICE TORQUE H2O (MISCELLANEOUS) ×3 IMPLANT
FILTER CO2 0.2 MICRON (VASCULAR PRODUCTS) ×3 IMPLANT
GUIDEWIRE LT ZIPWIRE 035X260 (WIRE) ×3 IMPLANT
HOVERMATT SINGLE USE (MISCELLANEOUS) ×3 IMPLANT
KIT ENCORE 26 ADVANTAGE (KITS) ×6 IMPLANT
KIT MICROINTRODUCER STIFF 5F (SHEATH) ×3 IMPLANT
KIT PV (KITS) ×3 IMPLANT
RESERVOIR CO2 (VASCULAR PRODUCTS) ×3 IMPLANT
SET FLUSH CO2 (MISCELLANEOUS) ×3 IMPLANT
SHEATH PINNACLE 5F 10CM (SHEATH) ×3 IMPLANT
SHEATH PINNACLE ST 7F 45CM (SHEATH) ×3 IMPLANT
SHIELD RADPAD SCOOP 12X17 (MISCELLANEOUS) ×3 IMPLANT
SYR MEDRAD MARK V 150ML (SYRINGE) IMPLANT
TRANSDUCER W/STOPCOCK (MISCELLANEOUS) ×3 IMPLANT
TRAY PV CATH (CUSTOM PROCEDURE TRAY) ×3 IMPLANT
WIRE BAREWIRE WORK .014X315CM (WIRE) ×3 IMPLANT
WIRE BENTSON .035X145CM (WIRE) ×3 IMPLANT
WIRE ROSEN-J .035X180CM (WIRE) ×3 IMPLANT

## 2017-10-12 NOTE — Plan of Care (Signed)
  Clinical Measurements: Respiratory complications will improve 10/12/2017 0537 - Progressing by Peggye Pitt, RN   Safety: Ability to remain free from injury will improve 10/12/2017 0537 - Progressing by Peggye Pitt, RN   Clinical Measurements: Will remain free from infection 10/12/2017 0537 - Not Progressing by Peggye Pitt, RN   Activity: Risk for activity intolerance will decrease 10/12/2017 0537 - Not Progressing by Peggye Pitt, RN   Education: Knowledge of General Education information will improve 10/12/2017 [phone] - Not Progressing by Peggye Pitt, RN   Elimination: Will not experience complications related to bowel motility 10/12/2017 0537 - Completed/Met by Peggye Pitt, RN

## 2017-10-12 NOTE — Telephone Encounter (Signed)
Sched lab 11/05/17 at 1:00 and MD 11/10/17 at 12:15. Spoke to pt.

## 2017-10-12 NOTE — Op Note (Signed)
Patient name: [name] MRN: [id number] DOB: 05-26-1941 Sex: male  10/12/2017 Pre-operative Diagnosis: Left foot ulcer Post-operative diagnosis:  Same Surgeon:  Annamarie Major Procedure Performed:  1.  Ultrasound-guided access, right femoral artery  2.  Abdominal aortogram with CO2  3.  Left lower extremity arteriogram with CO2  4.  Atherectomy and drug-coated balloon angioplasty x2 of the left superficial femoral and popliteal artery     Indications: The patient has a nonhealing wound on his left foot secondary to diabetes.  He comes in today for arteriogram.  Procedure:  The patient was identified in the holding area and taken to room 8.  The patient was then placed supine on the table and prepped and draped in the usual sterile fashion.  A time out was called.  Ultrasound was used to evaluate the right common femoral artery.  It was patent .  A digital ultrasound image was acquired.  A micropuncture needle was used to access the right common femoral artery under ultrasound guidance.  An 018 wire was advanced without resistance and a micropuncture sheath was placed.  The 018 wire was removed and a benson wire was placed.  The micropuncture sheath was exchanged for a 5 french sheath.  An omniflush catheter was advanced over the wire to the level of L-1.  An abdominal angiogram with CO2 was obtained.  Next, using the omniflush catheter and a benson wire, the aortic bifurcation was crossed and the catheter was placed into theleft external iliac artery and left runoff was obtained.    Findings:   Aortogram: No significant aortic stenosis was identified.  Renal arteries were not well evaluated.  No significant bilateral common or external iliac stenosis was identified.  Right Lower Extremity: Not evaluated  Left Lower Extremity: The left common femoral and profunda femoral artery were widely patent.  The left superficial femoral artery was patent however in the proximal portion, there was a  4 cm long segment with an 80% stenosis.  Down behind the knee there was a heavily calcified lesion with near total occlusion.  The below-knee popliteal artery was mildly diseased with a single vessel runoff via the posterior tibial artery.  Intervention: After the above images were acquired the decision was made to proceed with intervention.  A 7 French 45 cm sheath was advanced over a Rosen wire into the left external iliac artery.  The patient was fully heparinized.  I then used a 035 quick cross catheter and a Glidewire to cross the lesion in the superficial femoral and popliteal artery.  The quick cross catheter was positioned in the below-knee popliteal artery and additional imaging of the runoff was performed with contrast.  A bare wire was then placed and then a large NAV 6 filter was placed in the below-knee popliteal artery.  I then selected the jetstream atherectomy device.  This was advanced down to the popliteal lesion.  I first performed a passed down and back with blades down and then performed a pass down across the lesion with blades up.  The jetstream device was then removed.  I then performed an arteriogram of the proximal lesion to locate it.  The jetstream device was then reinserted.  Atherectomy was then performed across the proximal lesion going down and back with blades down and then one pass down with blades up.  I then selected a Lutonix 6 x 40 balloon and performed drug-coated balloon angioplasty of the proximal superficial femoral artery lesion, taking the balloon to  nominal pressure for 2 minutes.  This balloon was then removed and I selected a second Lutonix 5 x 100 balloon and advanced this into the popliteal artery at the level of the joint space and perform drug-coated balloon angioplasty, taking the balloon to 7 atm for 2 minutes.  Completion imaging was then performed.  There was in-line flow to the superficial femoral artery.  There was a mild non-flow-limiting dissection off the  lateral aspect of the proximal lesion.  The lesion in the popliteal artery was widely patent with no stenosis.  I then removed the filter.  There was no debris within the filter other than residual drug from the balloon.  Final runoff was then performed which showed in-line flow with no residual stenosis in the superficial femoral and popliteal artery with single-vessel runoff via the posterior tibial artery.  There were no change in runoff pre-and post intervention.  At this point, catheters and wires were removed and the sheath was withdrawn to the right external iliac artery.  The patient was taken to the holding area for sheath pull once his coagulation profile corrects.  Impression:  #1 in the proximal superficial femoral artery successfully treated with jetstream atherectomy and 6 x 40 drug-coated balloon angioplasty Lutonix with residual stenosis less than 10% and a low-grade non-flow-limiting dissection 80% stenosis  #2  Near occlusive lesion in the distal above-knee popliteal artery that was heavily calcified successfully treated with jetstream atherectomy and drug-coated balloon angioplasty using a 5 x 100 balloon with no residual stenosis.  #3  Single-vessel runoff via the posterior tibial artery  #4  A total of 19 cc of contrast was administered    V. Annamarie Major, M.D. [facility] Office: [phone] Pager:  [phone]

## 2017-10-12 NOTE — Telephone Encounter (Signed)
-----   Message from Sharee Pimple, RN sent at 10/12/2017  3:42 PM EST ----- Regarding: 3-4 weeks w/ 2 labs   ----- Message ----- From: Nada Libman, MD Sent: 10/12/2017   3:17 PM To: Vvs Charge Pool  10/12/2017:  Surgeon:  Durene Cal Procedure Performed:  1.  Ultrasound-guided access, right femoral artery  2.  Abdominal aortogram with CO2  3.  Left lower extremity arteriogram with CO2  4.  Atherectomy and drug-coated balloon angioplasty x2 of the left superficial femoral and popliteal artery  Follow-up 3-4 weeks with ABI and duplex of the left leg to see me

## 2017-10-12 NOTE — Care Management Note (Signed)
Case Management Note Original Note Created [place], Chrystine Oiler, RN 10/05/2017, 10:09 AM   Patient Details  Name: [name] MRN: [id number] Date of Birth: 1941-05-03  Subjective/Objective: Adm from home with Left foot cellulitis. Lives with wife, ind with ADL's. Has RW already at home if needed. Patient developed hematochezia and hematemesis, scheduled EGD today. No HH pta.                  Action/Plan:Anticipate DC home with self care. Patient evaluated by PT and no recommendations made for home health. If patient need HH RN, patient has had [facility] at home in the past.   Expected Discharge Date:                  Expected Discharge Plan:  Skilled Nursing Facility  In-House Referral:  Clinical Social Work  Discharge planning Services  CM Consult  Post Acute Care Choice:    Choice offered to:     DME Arranged:    DME Agency:     HH Arranged:    HH Agency:     Status of Service:  In process, will continue to follow  If discussed at Long Length of Stay Meetings, dates discussed:    Discharge Disposition:   Additional Comments:  10/12/17- 1520- Donn Pierini RN, CM- pt tx from [facility]. On 10/11/17 for further Vascular work up-  Arteriogram done today with angioplasty- PT recommendations for SNF- CSW to follow for placement needs- pt remains on IV abx. - CM will follow   Darrold Span, RN 10/12/2017, 3:28 PM [phone]

## 2017-10-12 NOTE — H&P (Signed)
[facility] [place]  Patient name: [name]  MRN: [id number]        DOB: 1940/12/30          Sex: male   REQUESTING PROVIDER:    Hospital Service            REASON FOR CONSULT:    Left foot infection  HISTORY OF PRESENT ILLNESS:   [name] is a 76 y.o. male, who was transferred from [facility] this evening.  He presented to the emergency department on 09/27/2017 with a 3-day history of pain swelling and redness in his left foot.  He stated that he stepped on a nail that went through his shoe approximately 2 weeks prior to the admission.  He had been soaking his foot in Epsom salts.  He was in the emergency department on 09/24/2017 and x-rays were negative and he was sent home with oral antibiotics.  His symptoms got worse and he represented to the hospita and was admitted for worsening cellulitis.  Surgical consultation was obtained.  Segmental pressures were ordered.  The left ABI was unobtainable due to noncompressibility of the vessels.  The study did reveal occlusive vascular disease.  On 10/04/2017 he had a bloody bowel movement  As well as hematemesis.  He was hypotensive and diaphoretic.  He had been on anticoagulation with a significantly elevated INR.  He was given reversal agents and IV fluids and transferred to the ICU.  He was scheduled for endoscopy on 1120 and became apneic after anesthesia requiring intubation.  The procedure was aborted.  The patient was subsequently transferred to [facility]  He remains intubated.  Patient suffers from Diabetes.  His hemoglobin A1c is 7.8.  He has acute on chronic renal failure (CKD stage III.  He suffers from paroxysmal atrial fibrillation on anticoagulation.  He also has coronary artery disease and has undergone multiple percutaneous interventions.  He has ischemic cardiomyopathy with an ejection fraction of 55-60%.  His blood pressure is well controlled.   He suffers from hypercholesterolemia.  PAST MEDICAL HISTORY        Past Medical History:  Diagnosis Date  . AICD (automatic cardioverter/defibrillator) present   . Anemia   . Anemia-chronic    a. Pt reports h/o anemia - was offered IV iron in past by nephrologist but declined and has taken PO instead.  . Aneurysm, cerebral   . Arthritis    bilateral wrist and fingers  . BPH (benign prostatic hypertrophy)   . Cardiomyopathy, ischemic    LVEF 25-35%.  . Chronic kidney disease, stage III (moderate) (HCC)    Followed by Dr. Hinda Lenis.  . Coronary atherosclerosis of native coronary artery    a. DES to RCA 03/2009. b. s/p PTCA to RCA for ISR, 05/2010. c. 03/2013 NSTEMI 2/2 severe prox RCA s/p PTCA/DES, med rx for residual dz.  . Essential hypertension, benign   . Hyperlipidemia   . Hypothyroidism   . LBBB (left bundle branch block)    s/p BiV ICD Implanted by Dr Caryl Comes  . Morbid obesity ([place])   . MRSA (methicillin resistant Staphylococcus aureus)   . Myocardial infarction ([place])   . Paroxysmal atrial fibrillation ([place])    a. Coumadin discontinue in 2010 when the patient required ASA/Plavix for stenting. b. Coumadin restarted 05/2013, Plavix continued, ASA stopped.   . Pneumonia    years ago 56  . Pulmonary nodule    CXR, 03/2013, [facility] - not described  on any subsequent chest x-rays however, could be a vascular structure  . TIA (transient ischemic attack)    Multiple TIAs  . Type 2 diabetes mellitus ([place])      FAMILY HISTORY        Family History  Problem Relation Age of Onset  . Diabetes Mother        Bilateral leg amputation  . Heart disease Mother        Before age 47  . Hypertension Mother   . Liver disease Mother        fatty liver   . Cancer Father        Prostate and Bone  . Diabetes Father   . Cancer Brother        Prostate  . Heart disease Other        family h/o premature cardiovascular disease  . Cancer  Sister        Colon   . Hypertension Sister     SOCIAL HISTORY:   Social History        Socioeconomic History  . Marital status: Married    Spouse name: Not on file  . Number of children: Not on file  . Years of education: Not on file  . Highest education level: Not on file  Social Needs  . Financial resource strain: Not on file  . Food insecurity - worry: Not on file  . Food insecurity - inability: Not on file  . Transportation needs - medical: Not on file  . Transportation needs - non-medical: Not on file  Occupational History  . Not on file  Tobacco Use  . Smoking status: Former Smoker    Packs/day: 1.00    Years: 6.00    Pack years: 6.00    Types: Cigarettes    Start date: 12/08/1949    Last attempt to quit: 11/16/1964    Years since quitting: 52.9  . Smokeless tobacco: Former Systems developer    Quit date: 04/15/1965  Substance and Sexual Activity  . Alcohol use: Yes    Alcohol/week: 0.0 oz    Comment: once/month  . Drug use: No  . Sexual activity: Not on file  Other Topics Concern  . Not on file  Social History Narrative  . Not on file    ALLERGIES:         Allergies  Allergen Reactions  . Allegra [Fexofenadine]   . Biaxin [Clarithromycin]   . Crestor [Rosuvastatin Calcium]   . Glucophage [Metformin Hcl]   . Trimethoprim   . Codeine Other (See Comments)    blisters, but able to take hydrocodone    CURRENT MEDICATIONS:             Current Facility-Administered Medications  Medication Dose Route Frequency Provider Last Rate Last Dose  . allopurinol (ZYLOPRIM) tablet 100 mg  100 mg Oral Daily Tat, David, MD   100 mg at 10/05/17 1008  . carvedilol (COREG) tablet 12.5 mg  12.5 mg Oral BID WC Thomas, Tijo, DO   12.5 mg at 10/05/17 0818  . chlorhexidine gluconate (MEDLINE KIT) (PERIDEX) 0.12 % solution 15 mL  15 mL Mouth Rinse BID Sinda Du, MD   15 mL at 10/05/17 2036  . [START ON 10/06/2017] Chlorhexidine  Gluconate Cloth 2 % PADS 6 each  6 each Topical Daily Sampson Goon, MD      . fentaNYL (SUBLIMAZE) injection 50 mcg  50 mcg Intravenous Q2H PRN Sinda Du, MD   50 mcg at 10/05/17  1642  . hydrALAZINE (APRESOLINE) tablet 25 mg  25 mg Oral TID Marcello Moores, Tijo, DO   Stopped at 10/05/17 1053  . hydrOXYzine (ATARAX/VISTARIL) tablet 10 mg  10 mg Oral TID PRN Wynetta Emery, Clanford L, MD   10 mg at 10/01/17 1213  . insulin aspart (novoLOG) injection 0-9 Units  0-9 Units Subcutaneous Q4H Irwin Brakeman L, MD   2 Units at 10/05/17 2022  . insulin glargine (LANTUS) injection 30 Units  30 Units Subcutaneous Daily Johnson, Clanford L, MD   30 Units at 10/05/17 1009  . isosorbide mononitrate (IMDUR) 24 hr tablet 30 mg  30 mg Oral Daily Thomas, Tijo, DO   Stopped at 10/05/17 1053  . levothyroxine (SYNTHROID, LEVOTHROID) tablet 50 mcg  50 mcg Oral QAC breakfast Tat, Shanon Brow, MD   50 mcg at 10/05/17 0818  . loratadine (CLARITIN) tablet 10 mg  10 mg Oral Daily Isaac Bliss, Rayford Halsted, MD   10 mg at 10/05/17 1008  . MEDLINE mouth rinse  15 mL Mouth Rinse QID Sinda Du, MD   15 mL at 10/05/17 1618  . norepinephrine (LEVOPHED) 4 mg in dextrose 5 % 250 mL (0.016 mg/mL) infusion  0-40 mcg/min Intravenous Titrated Simonne Maffucci B, MD      . pantoprazole (PROTONIX) 80 mg in sodium chloride 0.9 % 250 mL (0.32 mg/mL) infusion  8 mg/hr Intravenous Continuous Fuller Plan A, MD 25 mL/hr at 10/05/17 1802 8 mg/hr at 10/05/17 1802  . [START ON 10/07/2017] pantoprazole (PROTONIX) injection 40 mg  40 mg Intravenous Q12H Smith, Rondell A, MD      . piperacillin-tazobactam (ZOSYN) IVPB 3.375 g  3.375 g Intravenous Q8H Isaac Bliss, Rayford Halsted, MD 12.5 mL/hr at 10/05/17 2023 3.375 g at 10/05/17 2023  . propofol (DIPRIVAN) 1000 MG/100ML infusion  0-50 mcg/kg/min Intravenous Continuous Sinda Du, MD 18.1 mL/hr at 10/05/17 2023 25 mcg/kg/min at 10/05/17 2023  . sodium chloride flush (NS) 0.9 % injection 10-40 mL   10-40 mL Intracatheter Q12H Sampson Goon, MD   10 mL at 10/05/17 2104  . sodium chloride flush (NS) 0.9 % injection 10-40 mL  10-40 mL Intracatheter PRN Sampson Goon, MD      . tamsulosin [facility]) capsule 0.4 mg  0.4 mg Oral QPC supper Tat, Shanon Brow, MD   0.4 mg at 10/04/17 1632  . vancomycin (VANCOCIN) IVPB 1000 mg/200 mL premix  1,000 mg Intravenous Q24H Johnson, Clanford L, MD 200 mL/hr at 10/05/17 2103 1,000 mg at 10/05/17 2103  . Warfarin - Pharmacist Dosing Inpatient   Does not apply Q24H Tat, David, MD        REVIEW OF SYSTEMS:   Unable to obtain secondary to intubation  PHYSICAL EXAM:         Vitals:   10/05/17 1810 10/05/17 1900 10/05/17 2000 10/05/17 2055  BP:  (!) 126/58 (!) 108/49 (!) 123/46  Pulse:  64 (!) 58 (!) 55  Resp:  _0 Temp: 97.9 F (36.6 C) (!) 97.3 F (36.3 C) (!) 97 F (36.1 C)   TempSrc: Oral Core (Comment)    SpO2:  100% 100% 100%  Weight:      Height:        GENERAL: The patient is a well-nourished male, in no acute distress. The vital signs are documented above. CARDIAC: There is a regular rate and rhythm.  VASCULAR: I cannot palpate a left pedal pulse.  There is significant edema in the leg. PULMONARY: Nonlabored respirations ABDOMEN: Soft and  non-tender with normal pitched bowel sounds.  MUSCULOSKELETAL: There are no major deformities or cyanosis. NEUROLOGIC: No focal weakness or paresthesias are detected. SKIN: Erythema and open bulla on the dorsum of the foot PSYCHIATRIC: The patient has a normal affect.  STUDIES:   I have reviewed his vascular lab studies with the following findings: Right:  Resting ABI in the moderate range of arterial occlusive disease.  Segmental Doppler signal at the ankle indicates proximal occlusive vascular disease.  Left:  Non obtainable ABI on the left with the segmental Doppler exam at the ankle demonstrating more proximal occlusive vascular disease. If the patient is  a candidate for revascularization, anatomic imaging may be considered such as formal angiogram or CT angiogram/runoff.  ASSESSMENT and PLAN   Diabetic foot infection: The patient has vascular insufficiency based on segmental Doppler pressures.  He has a significant wound on the dorsum of his foot.  He will need arterial evaluation via angiography once he stabilizes clinically.  This will need to be done at [facility], and he will require transfer following stabilization of his GI bleed.  Continue IV antibiotics for his localized infection.  Continue IV hydration for his acute on chronic renal insufficiency.  I spoke with his wife tonight and outlined our plan.  If he stabilizes from his GI bleed, I will proceed with his angiography on Friday at [facility], [facility] [phone] Pager [phone]

## 2017-10-12 NOTE — Progress Notes (Signed)
Patient arrived from [facility] ICU to 4E25. Patient on bedrest, unable to move on own. Patient's foley had crusty brown discharge at foley site. Patient's scrotum very edematous, MASD with skin breakdown on left underside of scrotum. Patient's whole buttock area was red, flaky with some skin breakdown. None of this was given in report. Patient told NT that, "they were waiting for him to die".   Placed order for wound consult. Foley care done and barrier cream applied

## 2017-10-12 NOTE — Progress Notes (Signed)
Site area: Right groin a 7 french arterial sheath was removed  Site Prior to Removal:  Level 0  Pressure Applied For 30 MINUTES    Bedrest Beginning at 1505p  Manual:   Yes.    Patient Status During Pull:  stable  Post Pull Groin Site:  Level 0  Post Pull Instructions Given:  Yes.    Post Pull Pulses Present:  Yes.    Dressing Applied:  Yes.    Comments:  VS remain stable

## 2017-10-12 NOTE — Consult Note (Signed)
WOC Nurse wound consult note Reason for Consult: MASD, buttocks/sacrum/scrotum  Wound type: MASD (moisture associated skin damage), severe bilateral buttocks, sacrum, posterior thighs, and posterior scrotum Intertriginous skin damage; left groin Patient has FC in place, incontinent of stool.  Pressure Injury POA: /NA Measurement: unable to measure Wound bed: intense redness noted buttocks, sacrum, thighs, scrotum that blanches.  Also left groin, with intense redness and satellite lesions. Fungal overgrowth most likely in all the sites.  Small partial thickness skin loss left inner buttock fold, related to moisture  Drainage (amount, consistency, odor) none Periwound: intact, scrotum is extremely edematous  Dressing procedure/placement/frequency: Add Gerhardt's to buttocks, thighs, scrotum Add Interdry Ag+ antimicrobial wicking fabric to the left groin Place on low air loss mattress for moisture management.    Consider PO antifungal for overgrowth in the affected areas.   Discussed POC with patient and bedside nurse.  Re consult if needed, will not follow at this time. Thanks  Marliss Buttacavoli [employer], RN,CWOCN, CNS, CWON-AP [phone])

## 2017-10-12 NOTE — Progress Notes (Signed)
eLink Physician-Brief Progress Note Patient Name: [name] DOB: 06-03-1941 MRN: [id number]   Date of Service  10/12/2017  HPI/Events of Note  Notified by bedside nurse of persistent low-grade fever. Highest documented temp 100.9 Fahrenheit. Patient planned for revascularization/limited contrast angiogram tomorrow morning. Questionable sepsis with left lower extremity wound. Currently on Ancef. Last cultures are from 11/12. Suspect fever may be more due to limb ischemia that anything else.   eICU Interventions  1. Tylenol ordered for fever suppression 2. Reordering blood and urine cultures as well as urinalysis 3. Trending Procalcitonin per algorithm for possible sepsis 4. Continuing empiric Ancef      Intervention Category Major Interventions: Infection - evaluation and management  Lawanda Cousins 10/12/2017, 12:15 AM

## 2017-10-12 NOTE — Progress Notes (Signed)
Patient ID: [name], male   DOB: July 06, 1941, 76 y.o.   MRN: [id number]  PROGRESS NOTE    RUBLE WILDEN  WVP:[id number] DOB: [birth year] DOA: 09/27/2017 PCP: [name], MD   Brief Narrative:  76 year old male with history of diabetes mellitus, coronary artery disease on Plavix, paroxysmal atrial fibrillation on Coumadin, CKD stage III, rheumatoid arthritis, ischemic cardiomyopathy status post ICD presented on 09/27/2017 with 3 day history of increasing pain, edema, erythema in his left foot after stepping on a nail 2 weeks prior. Patient was initially started on broad-spectrum antibiotics which were switched to oral doxycycline. His cellulitis got worse, he was restarted on IV antibiotics. Patient had hematemesis and heme positive stools on 10/04/2017 with hypotension for which he had few units of packed red cells transfusion and vitamin K. GI was consulted. Patient was planned for EGD on 10/05/2017 but patient became hypoxic and cyanotic, had to be intubated and was transferred to [facility]. Patient remained on intravenous PPI therapy and EGD was done on 10/06/2017 which showed a large blood clot covering most of the gastric body. Repeat EGD on 10/07/2017 showed large blood clot again and some linear ulcers and stomach. Hemoglobin stabilized, bleeding stopped; no further plans for EGD from GI. Patient was extubated and was transferred to [facility] for further vascular surgery evaluation and workup as per vascular surgery recommendations. Assessment & Plan:   Active Problems:   Hypertensive cardiovascular disease   Biventricular ICD (implantable cardioverter-defibrillator) in place   Cardiomyopathy, ischemic   Paroxysmal atrial fibrillation (HCC)   CKD (chronic kidney disease) stage 3, GFR 30-59 ml/min (HCC)   PVD (peripheral vascular disease) with claudication (HCC)   Foot pain, left   Diabetic foot infection (HCC)   Hematemesis   Acute blood loss anemia   Acute  respiratory failure (HCC)   Diabetic left foot infection - Status post course of IV Zosyn and vancomycin; currently on Ancef. Still having low-grade temperatures -Vascular surgery is following and plan for angiography today  Leukocytosis - Probably secondary to above versus reactive. Repeat a.m. Labs. Continue Ancef  Acute hypoxic respiratory failure in the setting of GI bleeding and sedation - Resolved. Currently on 2 L nasal cannula  Hemorrhagic shock - Resolved - Coumadin and Plavix on hold  Upper GI bleeding with gastric ulcer and large clot - Status post EGD 2. No plans for further EGD - continue PPI - outpatient follow-up with GI - Tolerating diet. Hemoglobin stable  Acute blood loss anemia secondary to upper GI bleeding and supratherapeutic INR secondary to Coumadin - Resolved. Hemoglobin stable for now  Chronic kidney disease stage III - Creatinine stable. Monitor  Paroxysmal atrial fibrillation - Rate controlled. Coumadin still on hold  History of coronary artery disease and ischemic cardiomyopathy - Keep Plavix on hold. Outpatient follow-up with cardiology  Diabetes mellitus type 2 - continue Accu-Cheks with coverage   DVT prophylaxis: Avoid Lovenox secondary to GI bleed Code Status:  full Family Communication: none at bedside Disposition Plan:depends on clinical outcome  Consultants: PCCM/vascular surgery/GI  Procedures: EGD on 10/06/2017 and 10/07/2017 Intubation and extubation  Antimicrobials:  Anti-infectives (From admission, onward)   Start     Dose/Rate Route Frequency Ordered Stop   10/08/17 1400  ceFAZolin (ANCEF) IVPB 2g/100 mL premix     2 g 200 mL/hr over 30 Minutes Intravenous Every 8 hours 10/08/17 1115     10/07/17 2200  vancomycin (VANCOCIN) IVPB 750 mg/150 ml premix  Status:  Discontinued  750 mg 150 mL/hr over 60 Minutes Intravenous Every 24 hours 10/06/17 2118 10/08/17 1116   10/06/17 2200  vancomycin (VANCOCIN) IVPB 750 mg/150  ml premix  Status:  Discontinued     750 mg 150 mL/hr over 60 Minutes Intravenous Every 24 hours 10/06/17 2118 10/06/17 2118   10/06/17 1545  erythromycin 500 mg in sodium chloride 0.9 % 100 mL IVPB  Status:  Discontinued     500 mg 100 mL/hr over 60 Minutes Intravenous Every 8 hours 10/06/17 1544 10/06/17 1902   10/03/17 2100  vancomycin (VANCOCIN) IVPB 1000 mg/200 mL premix  Status:  Discontinued     1,000 mg 200 mL/hr over 60 Minutes Intravenous Every 24 hours 10/02/17 2058 10/06/17 2118   09/29/17 2000  vancomycin (VANCOCIN) 1,500 mg in sodium chloride 0.9 % 500 mL IVPB  Status:  Discontinued     1,500 mg 250 mL/hr over 120 Minutes Intravenous Every 48 hours 09/27/17 1808 09/28/17 1100   09/29/17 2000  vancomycin (VANCOCIN) 1,250 mg in sodium chloride 0.9 % 250 mL IVPB  Status:  Discontinued     1,250 mg 166.7 mL/hr over 90 Minutes Intravenous Every 24 hours 09/29/17 1959 10/02/17 2058   09/29/17 2000  piperacillin-tazobactam (ZOSYN) IVPB 3.375 g  Status:  Discontinued     3.375 g 12.5 mL/hr over 240 Minutes Intravenous Every 8 hours 09/29/17 1959 10/08/17 1115   09/28/17 2200  doxycycline (VIBRA-TABS) tablet 100 mg  Status:  Discontinued     100 mg Oral Every 12 hours 09/28/17 1904 09/29/17 1751   09/28/17 2000  vancomycin (VANCOCIN) 1,250 mg in sodium chloride 0.9 % 250 mL IVPB  Status:  Discontinued     1,250 mg 166.7 mL/hr over 90 Minutes Intravenous Every 24 hours 09/28/17 1100 09/28/17 1904   09/27/17 2100  ceFEPIme (MAXIPIME) 2 g in dextrose 5 % 50 mL IVPB  Status:  Discontinued     2 g 100 mL/hr over 30 Minutes Intravenous Every 24 hours 09/27/17 1808 09/28/17 1904   09/27/17 2000  vancomycin (VANCOCIN) IVPB 1000 mg/200 mL premix     1,000 mg 200 mL/hr over 60 Minutes Intravenous  Once 09/27/17 1808 09/27/17 2100   09/27/17 1245  vancomycin (VANCOCIN) IVPB 1000 mg/200 mL premix     1,000 mg 200 mL/hr over 60 Minutes Intravenous  Once 09/27/17 1238 09/27/17 1440   09/27/17  1245  piperacillin-tazobactam (ZOSYN) IVPB 3.375 g     3.375 g 100 mL/hr over 30 Minutes Intravenous  Once 09/27/17 1238 09/27/17 1405         Subjective: Patient seen and examined at bedside. He denies overnight nausea, vomiting, chest pain, bloody stools.  Objective: Vitals:   10/12/17 0005 10/12/17 0200 10/12/17 0406 10/12/17 0800  BP: (!) 142/61  136/64 (!) 156/70  Pulse: 92 85 82 78  Resp: 19 18 19  (!) 23  Temp: (!) 100.7 F (38.2 C) 99.9 F (37.7 C) 99.5 F (37.5 C) 99.1 F (37.3 C)  TempSrc: Axillary Axillary Oral Oral  SpO2: 97% 96% 97% 97%  Weight:      Height:        Intake/Output Summary (Last 24 hours) at 10/12/2017 0917 Last data filed at 10/12/2017 0630 Gross per 24 hour  Intake 896 ml  Output 1031 ml  Net -135 ml   Filed Weights   10/07/17 0500 10/11/17 0600 10/11/17 2137  Weight: 122.9 kg (270 lb 15.1 oz) 116.3 kg (256 lb 6.3 oz) 119 kg (262 lb 5.6 oz)  Examination:  General exam: Appears calm and comfortable; no acute distress Respiratory system: Bilateral decreased breath sound at bases with some scattered crackles Cardiovascular system: S1 & S2 heard, rate controlled  Gastrointestinal system: Abdomen is nondistended, soft and nontender. Normal bowel sounds heard. Extremities: No cyanosis, clubbing; Trace edema. left foot dressing present    Data Reviewed: I have personally reviewed following labs and imaging studies  CBC: Recent Labs  Lab 10/05/17 2213  10/08/17 0500 10/08/17 1419 10/08/17 2256 10/09/17 1059 10/10/17 0415 10/12/17 0131  WBC 11.6*   < > 11.0* 12.9* 12.9* 14.1* 15.2* 14.5*  NEUTROABS 10.1*  --  9.0*  --   --   --  12.7*  --   HGB 7.3*   < > 8.1* 8.8* 8.3* 8.3* 9.0* 10.0*  HCT 21.5*   < > 24.4* 27.0* 25.2* 25.9* 28.2* 30.7*  MCV 94.3   < > 96.1 97.1 96.9 97.4 95.9 95.0  PLT 226   < > 309 336 341 348 370 348   < > = values in this interval not displayed.   Basic Metabolic Panel: Recent Labs  Lab 10/05/17 2213   10/08/17 0500 10/09/17 0413 10/10/17 0415 10/11/17 0451 10/12/17 0131  NA 141   < > 142 144 145 139 134*  K 4.4   < > 4.0 4.1 4.0 4.2 4.1  CL 115*   < > 116* 115* 116* 112* 108  CO2 22   < > 21* 21* 21* 22 18*  GLUCOSE 205*   < > 117* 119* 127* 157* 172*  BUN 44*   < > 36* 32* 29* 29* 25*  CREATININE 1.99*   < > 1.99* 1.98* 1.87* 1.62* 1.72*  CALCIUM 8.5*   < > 8.2* 8.4* 8.5* 8.5* 8.3*  MG 2.2  --   --   --   --   --   --   PHOS 2.8  --   --   --   --   --   --    < > = values in this interval not displayed.   GFR: Estimated Creatinine Clearance: 45.1 mL/min (A) (by C-G formula based on SCr of 1.72 mg/dL (H)). Liver Function Tests: Recent Labs  Lab 10/05/17 2213 10/07/17 0328 10/08/17 0500  AST 49* 32 22  ALT 24 20 19   ALKPHOS 78 81 73  BILITOT 0.7 0.6 0.9  PROT 5.3* 5.5* 5.2*  ALBUMIN 2.0* 2.0* 1.9*   No results for input(s): LIPASE, AMYLASE in the last 168 hours. No results for input(s): AMMONIA in the last 168 hours. Coagulation Profile: Recent Labs  Lab 10/05/17 2213 10/12/17 0131  INR 1.38 1.35   Cardiac Enzymes: No results for input(s): CKTOTAL, CKMB, CKMBINDEX, TROPONINI in the last 168 hours. BNP (last 3 results) No results for input(s): PROBNP in the last 8760 hours. HbA1C: No results for input(s): HGBA1C in the last 72 hours. CBG: Recent Labs  Lab 10/11/17 1542 10/11/17 2010 10/11/17 2158 10/12/17 0404 10/12/17 0811  GLUCAP 181* 154* 161* 164* 161*   Lipid Profile: Recent Labs    10/11/17 1405  TRIG 289*   Thyroid Function Tests: No results for input(s): TSH, T4TOTAL, FREET4, T3FREE, THYROIDAB in the last 72 hours. Anemia Panel: No results for input(s): VITAMINB12, FOLATE, FERRITIN, TIBC, IRON, RETICCTPCT in the last 72 hours. Sepsis Labs: Recent Labs  Lab 10/06/17 0445 10/12/17 0131  PROCALCITON  --  0.41  LATICACIDVEN 0.6  --     Recent Results (from the past  240 hour(s))  MRSA PCR Screening     Status: None   Collection Time:  10/04/17  5:48 AM  Result Value Ref Range Status   MRSA by PCR NEGATIVE NEGATIVE Final    Comment:        The GeneXpert MRSA Assay (FDA approved for NASAL specimens only), is one component of a comprehensive MRSA colonization surveillance program. It is not intended to diagnose MRSA infection nor to guide or monitor treatment for MRSA infections.          Radiology Studies: No results found.      Scheduled Meds: . Chlorhexidine Gluconate Cloth  6 each Topical Daily  . collagenase   Topical Daily  . insulin aspart  0-9 Units Subcutaneous Q4H  . levothyroxine  25 mcg Intravenous Daily  . mouth rinse  15 mL Mouth Rinse BID  . metoprolol tartrate  5 mg Intravenous Q8H  . pantoprazole (PROTONIX) IV  40 mg Intravenous Q12H  . protein supplement shake  11 oz Oral Q24H  . sodium chloride flush  10-40 mL Intracatheter Q12H   Continuous Infusions: . sodium chloride 10 mL/hr (10/11/17 0800)  .  ceFAZolin (ANCEF) IV Stopped (10/12/17 0826)     LOS: 15 days        Glade Lloyd, MD [facility] Pager [phone]  If 7PM-7AM, please contact night-coverage [email or web address] Password [facility] 10/12/2017, 9:17 AM

## 2017-10-12 NOTE — Care Management Important Message (Signed)
Important Message  Patient Details  Name: [name] MRN: [id number] Date of Birth: 11/07/1941   Medicare Important Message Given:  Yes    Kyla Balzarine 10/12/2017, 10:34 AM

## 2017-10-13 ENCOUNTER — Inpatient Hospital Stay (HOSPITAL_COMMUNITY): Payer: PPO

## 2017-10-13 ENCOUNTER — Other Ambulatory Visit: Payer: Self-pay

## 2017-10-13 ENCOUNTER — Encounter (HOSPITAL_COMMUNITY): Payer: Self-pay | Admitting: Surgery

## 2017-10-13 DIAGNOSIS — I340 Nonrheumatic mitral (valve) insufficiency: Secondary | ICD-10-CM

## 2017-10-13 DIAGNOSIS — I739 Peripheral vascular disease, unspecified: Secondary | ICD-10-CM

## 2017-10-13 DIAGNOSIS — Z48812 Encounter for surgical aftercare following surgery on the circulatory system: Secondary | ICD-10-CM

## 2017-10-13 LAB — COMPREHENSIVE METABOLIC PANEL
ALBUMIN: 1.8 g/dL — AB (ref 3.5–5.0)
ALK PHOS: 145 U/L — AB (ref 38–126)
ALT: 8 U/L — ABNORMAL LOW (ref 17–63)
ANION GAP: 6 (ref 5–15)
AST: 32 U/L (ref 15–41)
BILIRUBIN TOTAL: 0.6 mg/dL (ref 0.3–1.2)
BUN: 33 mg/dL — AB (ref 6–20)
CALCIUM: 8.3 mg/dL — AB (ref 8.9–10.3)
CO2: 20 mmol/L — ABNORMAL LOW (ref 22–32)
Chloride: 109 mmol/L (ref 101–111)
Creatinine, Ser: 2 mg/dL — ABNORMAL HIGH (ref 0.61–1.24)
GFR calc Af Amer: 35 mL/min — ABNORMAL LOW (ref >60)
GFR, EST NON AFRICAN AMERICAN: 30 mL/min — AB (ref >60)
GLUCOSE: 175 mg/dL — AB (ref 65–99)
Potassium: 4 mmol/L (ref 3.5–5.1)
Sodium: 135 mmol/L (ref 135–145)
TOTAL PROTEIN: 5.7 g/dL — AB (ref 6.5–8.1)

## 2017-10-13 LAB — CBC WITH DIFFERENTIAL/PLATELET
BASOS PCT: 0 %
Basophils Absolute: 0 10*3/uL (ref 0.0–0.1)
Eosinophils Absolute: 0.3 10*3/uL (ref 0.0–0.7)
Eosinophils Relative: 2 %
HEMATOCRIT: 27.4 % — AB (ref 39.0–52.0)
HEMOGLOBIN: 8.8 g/dL — AB (ref 13.0–17.0)
LYMPHS ABS: 0.9 10*3/uL (ref 0.7–4.0)
LYMPHS PCT: 6 %
MCH: 30.6 pg (ref 26.0–34.0)
MCHC: 32.1 g/dL (ref 30.0–36.0)
MCV: 95.1 fL (ref 78.0–100.0)
MONOS PCT: 10 %
Monocytes Absolute: 1.5 10*3/uL — ABNORMAL HIGH (ref 0.1–1.0)
NEUTROS ABS: 11.9 10*3/uL — AB (ref 1.7–7.7)
NEUTROS PCT: 82 %
Platelets: 312 10*3/uL (ref 150–400)
RBC: 2.88 MIL/uL — ABNORMAL LOW (ref 4.22–5.81)
RDW: 15.4 % (ref 11.5–15.5)
WBC: 14.6 10*3/uL — ABNORMAL HIGH (ref 4.0–10.5)

## 2017-10-13 LAB — URINE CULTURE: Culture: NO GROWTH

## 2017-10-13 LAB — GLUCOSE, CAPILLARY
GLUCOSE-CAPILLARY: 155 mg/dL — AB (ref 65–99)
GLUCOSE-CAPILLARY: 168 mg/dL — AB (ref 65–99)
GLUCOSE-CAPILLARY: 188 mg/dL — AB (ref 65–99)
Glucose-Capillary: 159 mg/dL — ABNORMAL HIGH (ref 65–99)
Glucose-Capillary: 164 mg/dL — ABNORMAL HIGH (ref 65–99)
Glucose-Capillary: 156 mg/dL — ABNORMAL HIGH (ref 65–99)

## 2017-10-13 LAB — MAGNESIUM: Magnesium: 1.9 mg/dL (ref 1.7–2.4)

## 2017-10-13 LAB — ECHOCARDIOGRAM COMPLETE
Height: 68 in
WEIGHTICAEL: 4197.56 [oz_av]

## 2017-10-13 LAB — PROCALCITONIN: Procalcitonin: 0.55 ng/mL

## 2017-10-13 MED ORDER — LEVOTHYROXINE SODIUM 50 MCG PO TABS
50.0000 ug | ORAL_TABLET | Freq: Every day | ORAL | Status: DC
  Administered 2017-10-13 – 2017-10-15 (×3): 50 ug via ORAL
  Filled 2017-10-13 (×3): qty 1

## 2017-10-13 MED ORDER — FUROSEMIDE 10 MG/ML IJ SOLN
40.0000 mg | Freq: Once | INTRAMUSCULAR | Status: AC
  Administered 2017-10-13: 40 mg via INTRAVENOUS
  Filled 2017-10-13: qty 4

## 2017-10-13 MED ORDER — PANTOPRAZOLE SODIUM 40 MG PO TBEC
40.0000 mg | DELAYED_RELEASE_TABLET | Freq: Two times a day (BID) | ORAL | Status: DC
  Administered 2017-10-13 – 2017-10-15 (×5): 40 mg via ORAL
  Filled 2017-10-13 (×5): qty 1

## 2017-10-13 MED ORDER — PERFLUTREN LIPID MICROSPHERE
1.0000 mL | INTRAVENOUS | Status: AC | PRN
  Administered 2017-10-13: 2 mL via INTRAVENOUS
  Filled 2017-10-13: qty 10

## 2017-10-13 NOTE — Progress Notes (Signed)
  Progress Note    10/13/2017 7:43 AM 1 Day Post-Op  Subjective:  Patient denies pain to L foot.  He has some soreness in R groin.   Vitals:   10/13/17 0400 10/13/17 0413  BP: (!) 158/69   Pulse: 90   Resp: 20   Temp: 99.9 F (37.7 C) 98.2 F (36.8 C)  SpO2: 100%    Physical Exam: Cardiac:  RRR Lungs:  Clear lung fields B Extremities:  R groin without bleeding, palpable hematoma, or palpable pseudoaneurysm; L planter surface of foot with necrotic ulceration without drainage Abdomen:  Soft; limited due to body habitus Neurologic: A&O  CBC    Component Value Date/Time   WBC 14.6 (H) 10/13/2017 0329   RBC 2.88 (L) 10/13/2017 0329   HGB 8.8 (L) 10/13/2017 0329   HCT 27.4 (L) 10/13/2017 0329   PLT 312 10/13/2017 0329   MCV 95.1 10/13/2017 0329   MCH 30.6 10/13/2017 0329   MCHC 32.1 10/13/2017 0329   RDW 15.4 10/13/2017 0329   LYMPHSABS 0.9 10/13/2017 0329   MONOABS 1.5 (H) 10/13/2017 0329   EOSABS 0.3 10/13/2017 0329   BASOSABS 0.0 10/13/2017 0329    BMET    Component Value Date/Time   NA 135 10/13/2017 0329   K 4.0 10/13/2017 0329   CL 109 10/13/2017 0329   CO2 20 (L) 10/13/2017 0329   GLUCOSE 175 (H) 10/13/2017 0329   BUN 33 (H) 10/13/2017 0329   CREATININE 2.00 (H) 10/13/2017 0329   CALCIUM 8.3 (L) 10/13/2017 0329   GFRNONAA 30 (L) 10/13/2017 0329   GFRAA 35 (L) 10/13/2017 0329    INR    Component Value Date/Time   INR 1.35 10/12/2017 0131     Intake/Output Summary (Last 24 hours) at 10/13/2017 0743 Last data filed at 10/13/2017 0600 Gross per 24 hour  Intake 450 ml  Output 700 ml  Net -250 ml     Assessment/Plan:  76 y.o. male is s/p Aortogram with L SFA/popliteal atherectomy 1 Day Post-Op   Continue antibiotics per primary team Will re-consult wound care for L foot ulceration Non-weight bearing L forefoot; ok to weight bear L heel Start asa/plavix when ok with GI Vascular will sign off for now; we will arrange outpatient follow up  with Dr. Myra Gianotti in about 3-4 weeks    Emilie Rutter, PA-C [facility] [phone] 10/13/2017 7:43 AM

## 2017-10-13 NOTE — Clinical Social Work Note (Signed)
Clinical Social Work Assessment  Patient Details  Name: [name] MRN: [id number] Date of Birth: 1941-07-06  Date of referral:  10/13/17               Reason for consult:  Facility Placement                Permission sought to share information with:  Family Supports, Customer service manager Permission granted to share information::  Yes, Verbal Permission Granted  Name::     Joelle Flessner  Agency::  SNFs  Relationship::  spouse  Contact Information:  [phone]  Housing/Transportation Living arrangements for the past 2 months:  [place] of Information:  Patient, Spouse Patient Interpreter Needed:  None Criminal Activity/Legal Involvement Pertinent to Current Situation/Hospitalization:  No - Comment as needed Significant Relationships:  Spouse Lives with:  Spouse Do you feel safe going back to the place where you live?  Yes Need for family participation in patient care:  No (Coment)  Care giving concerns: Patient from home with wife. PT recommending SNF.   Social Worker assessment / plan: CSW met with patient and wife at bedside. Patient alert and oriented. Patient reports increased pain and redness in foot after having stepped on a tack, which is why he presented at the hospital. Patient and wife report patient was completely independent with mobility and ADLs PTA, but has become increasingly weak and debilitated while admitted. Patient and wife agreeable to short term rehab. CSW sent out initial referrals. CSW to follow and support with discharge.  Employment status:  Retired Archivist) PT Recommendations:  [place] / Referral to community resources:  [place]  Patient/Family's Response to care: Patient and wife appreciative of care.  Patient/Family's Understanding of and Emotional Response to Diagnosis, Current Treatment, and Prognosis: Patient and wife  with good understanding of patient's condition and agreeable to SNF for rehab.  Emotional Assessment Appearance:  Appears stated age Attitude/Demeanor/Rapport:  Other(appropriate) Affect (typically observed):  Calm, Pleasant Orientation:  Oriented to Self, Oriented to Place, Oriented to  Time, Oriented to Situation Alcohol / Substance use:  Not Applicable Psych involvement (Current and /or in the community):  No (Comment)  Discharge Needs  Concerns to be addressed:  Discharge Planning Concerns Readmission within the last 30 days:  No Current discharge risk:  Physical Impairment Barriers to Discharge:  Continued Medical Work up   Estanislado Emms, LCSW 10/13/2017, 12:32 PM

## 2017-10-13 NOTE — Consult Note (Signed)
WOC re-consulted for left foot, reviewed chart. WOC nurse has seen patient last week and I was able to view wound yesterday.  Continue enzymatic debridement ointment daily. I would recommend follow up in a wound care center of his choice for close followup as well as [facility] for home wound care follow up.  Discussed POC with patient and bedside nurse.  Re consult if needed, will not follow at this time. Thanks  Travus Oren [employer], RN,CWOCN, CNS, CWON-AP [phone])

## 2017-10-13 NOTE — Progress Notes (Signed)
CSW gave patient bed offers and discussed with wife via phone. Patient and wife to review bed offers and make choice tomorrow. CSW started SNF authorization request with [employer]; CSW to follow up with [employer] with bed choice and estimated discharge date. CSW to follow and support with SNF placement.  Abigail Butts, LCSWA [phone]

## 2017-10-13 NOTE — Progress Notes (Signed)
  Echocardiogram 2D Echocardiogram has been performed.  [name] [name] 10/13/2017, 4:25 PM

## 2017-10-13 NOTE — Progress Notes (Signed)
PHARMACY - PHYSICIAN COMMUNICATION CRITICAL VALUE ALERT - BLOOD CULTURE IDENTIFICATION (BCID)  [name] is an 76 y.o. male with Diabetic foot infection and LLE ischemia s/p angioplasty 11/27.  1/2 Blood cultures growing Gram positive cocci.  BCID results invalid  Name of physician (or Provider) Contacted: N/A  Current antibiotics: Ancef 2 g IV q8h  Changes to prescribed antibiotics recommended:   Likely contaminant.  No changes at this time.  F/U final culture results  No results found for this or any previous visit.  [name] 10/13/2017  4:33 AM

## 2017-10-13 NOTE — Progress Notes (Signed)
Patient has ICD that did not pace for a moment, six wide QRS noted. It's pacing now Patient seems okay. ON call physician notified.

## 2017-10-13 NOTE — Progress Notes (Signed)
PT Cancellation Note  Patient Details Name: [name] MRN: [id number] DOB: 02-11-1941   Cancelled Treatment:    Reason Eval/Treat Not Completed: (P) Patient at procedure or test/unavailable(Pt off unit for testing ( echo ) will continue efforts per POC as patient is available.  )   Florestine Avers 10/13/2017, 4:05 PM  Joycelyn Rua, PTA pager [phone]

## 2017-10-13 NOTE — Progress Notes (Addendum)
Patient ID: [name], male   DOB: 1941-05-23, 76 y.o.   MRN: [id number]  PROGRESS NOTE    LENDON GEORGE  OHY:[id number] DOB: Jul 13, 1941 DOA: 09/27/2017 PCP: [name], MD   Brief Narrative:  76 year old male with history of diabetes mellitus, coronary artery disease on Plavix, paroxysmal atrial fibrillation on Coumadin, CKD stage III, rheumatoid arthritis, ischemic cardiomyopathy status post ICD presented on 09/27/2017 with 3 day history of increasing pain, edema, erythema in his left foot after stepping on a nail 2 weeks prior. Patient was initially started on broad-spectrum antibiotics which were switched to oral doxycycline. His cellulitis got worse, he was restarted on IV antibiotics. Patient had hematemesis and heme positive stools on 10/04/2017 with hypotension for which he had few units of packed red cells transfusion and vitamin K. GI was consulted. Patient was planned for EGD on 10/05/2017 but patient became hypoxic and cyanotic, had to be intubated and was transferred to [facility]. Patient remained on intravenous PPI therapy and EGD was done on 10/06/2017 which showed a large blood clot covering most of the gastric body. Repeat EGD on 10/07/2017 showed large blood clot again and some linear ulcers and stomach. Hemoglobin stabilized, bleeding stopped; no further plans for EGD from GI. Patient was extubated and was transferred to [facility] for further vascular surgery evaluation and workup as per vascular surgery recommendations.  Patient underwent Aortogram with L SFA/popliteal atherectomy  on 10/12/2017 by vascular surgery.   Assessment & Plan:   Active Problems:   Hypertensive cardiovascular disease   Biventricular ICD (implantable cardioverter-defibrillator) in place   Cardiomyopathy, ischemic   Paroxysmal atrial fibrillation (HCC)   CKD (chronic kidney disease) stage 3, GFR 30-59 ml/min (HCC)   PVD (peripheral vascular disease) with claudication (HCC)  Foot pain, left   Diabetic foot infection (HCC)   Hematemesis   Acute blood loss anemia   Acute respiratory failure (HCC)   Pressure injury of skin   Diabetic left foot infection - Status post course of IV Zosyn and vancomycin; currently on Ancef.  -Superficial cellulitis has almost resolved, patient still has necrotic ulceration on the plantar aspect, probably from the vascular disease -Vascular surgery is following: Status post L SFA/popliteal atherectomy  on 10/12/2017 by vascular surgery.  Follow further recommendations from vascular surgery  Leukocytosis - Probably secondary to above versus reactive. Repeat a.m. Labs. Continue Ancef  Gram-positive cocci in clusters bacteremia -Follow identification.  Repeat blood cultures for tomorrow.  2D echo.  Continue Ancef for now.  Can be contaminant as well but we will have to be cautious especially since the patient has ICD.  Acute hypoxic respiratory failure in the setting of GI bleeding and sedation - Resolved. Currently on 2 L nasal cannula -Chest x-ray today shows vascular congestion and bibasilar atelectasis.  Will give 1 dose of intravenous Lasix  Hemorrhagic shock - Resolved - Coumadin on hold.  Plavix has been restarted by vascular surgery  Upper GI bleeding with gastric ulcer and large clot - Status post EGD 2. No plans for further EGD - continue PPI - outpatient follow-up with GI - Tolerating diet. Hemoglobin stable  Acute blood loss anemia secondary to upper GI bleeding and supratherapeutic INR secondary to Coumadin - Resolved. Hemoglobin stable for now  Chronic kidney disease stage III - Creatinine slightly rising but stable. Monitor  Paroxysmal atrial fibrillation - Rate controlled. Coumadin still on hold  History of coronary artery disease and ischemic cardiomyopathy -Plavix restarted by vascular surgery. Outpatient  follow-up with cardiology  Diabetes mellitus type 2 - continue Accu-Cheks with  coverage   DVT prophylaxis: Avoid Lovenox secondary to GI bleed Code Status:  full Family Communication: none at bedside Disposition Plan:depends on clinical outcome  Consultants: PCCM/vascular surgery/GI  Procedures: EGD on 10/06/2017 and 10/07/2017 Intubation and extubation L SFA/popliteal atherectomy  on 10/12/2017 by vascular surgery Antimicrobials:  Anti-infectives (From admission, onward)   Start     Dose/Rate Route Frequency Ordered Stop   10/12/17 1630  fluconazole (DIFLUCAN) tablet 100 mg     100 mg Oral Daily 10/12/17 1327     10/08/17 1400  ceFAZolin (ANCEF) IVPB 2g/100 mL premix     2 g 200 mL/hr over 30 Minutes Intravenous Every 8 hours 10/08/17 1115     10/07/17 2200  vancomycin (VANCOCIN) IVPB 750 mg/150 ml premix  Status:  Discontinued     750 mg 150 mL/hr over 60 Minutes Intravenous Every 24 hours 10/06/17 2118 10/08/17 1116   10/06/17 2200  vancomycin (VANCOCIN) IVPB 750 mg/150 ml premix  Status:  Discontinued     750 mg 150 mL/hr over 60 Minutes Intravenous Every 24 hours 10/06/17 2118 10/06/17 2118   10/06/17 1545  erythromycin 500 mg in sodium chloride 0.9 % 100 mL IVPB  Status:  Discontinued     500 mg 100 mL/hr over 60 Minutes Intravenous Every 8 hours 10/06/17 1544 10/06/17 1902   10/03/17 2100  vancomycin (VANCOCIN) IVPB 1000 mg/200 mL premix  Status:  Discontinued     1,000 mg 200 mL/hr over 60 Minutes Intravenous Every 24 hours 10/02/17 2058 10/06/17 2118   09/29/17 2000  vancomycin (VANCOCIN) 1,500 mg in sodium chloride 0.9 % 500 mL IVPB  Status:  Discontinued     1,500 mg 250 mL/hr over 120 Minutes Intravenous Every 48 hours 09/27/17 1808 09/28/17 1100   09/29/17 2000  vancomycin (VANCOCIN) 1,250 mg in sodium chloride 0.9 % 250 mL IVPB  Status:  Discontinued     1,250 mg 166.7 mL/hr over 90 Minutes Intravenous Every 24 hours 09/29/17 1959 10/02/17 2058   09/29/17 2000  piperacillin-tazobactam (ZOSYN) IVPB 3.375 g  Status:  Discontinued     3.375  g 12.5 mL/hr over 240 Minutes Intravenous Every 8 hours 09/29/17 1959 10/08/17 1115   09/28/17 2200  doxycycline (VIBRA-TABS) tablet 100 mg  Status:  Discontinued     100 mg Oral Every 12 hours 09/28/17 1904 09/29/17 1751   09/28/17 2000  vancomycin (VANCOCIN) 1,250 mg in sodium chloride 0.9 % 250 mL IVPB  Status:  Discontinued     1,250 mg 166.7 mL/hr over 90 Minutes Intravenous Every 24 hours 09/28/17 1100 09/28/17 1904   09/27/17 2100  ceFEPIme (MAXIPIME) 2 g in dextrose 5 % 50 mL IVPB  Status:  Discontinued     2 g 100 mL/hr over 30 Minutes Intravenous Every 24 hours 09/27/17 1808 09/28/17 1904   09/27/17 2000  vancomycin (VANCOCIN) IVPB 1000 mg/200 mL premix     1,000 mg 200 mL/hr over 60 Minutes Intravenous  Once 09/27/17 1808 09/27/17 2100   09/27/17 1245  vancomycin (VANCOCIN) IVPB 1000 mg/200 mL premix     1,000 mg 200 mL/hr over 60 Minutes Intravenous  Once 09/27/17 1238 09/27/17 1440   09/27/17 1245  piperacillin-tazobactam (ZOSYN) IVPB 3.375 g     3.375 g 100 mL/hr over 30 Minutes Intravenous  Once 09/27/17 1238 09/27/17 1405         Subjective: Patient seen and examined at bedside. He  denies overnight nausea, vomiting, chest pain, bloody stools.  Feels better.  Objective: Vitals:   10/13/17 0000 10/13/17 0400 10/13/17 0413 10/13/17 0823  BP: (!) 149/62 (!) 158/69  (!) 153/68  Pulse: 85 90  82  Resp: 18 20  17   Temp: 100.1 F (37.8 C) 99.9 F (37.7 C) 98.2 F (36.8 C) 98.7 F (37.1 C)  TempSrc: Axillary Axillary  Oral  SpO2: 99% 100%  100%  Weight:      Height:        Intake/Output Summary (Last 24 hours) at 10/13/2017 1145 Last data filed at 10/13/2017 0904 Gross per 24 hour  Intake 690 ml  Output 700 ml  Net -10 ml   Filed Weights   10/07/17 0500 10/11/17 0600 10/11/17 2137  Weight: 122.9 kg (270 lb 15.1 oz) 116.3 kg (256 lb 6.3 oz) 119 kg (262 lb 5.6 oz)    Examination:  General exam: Appears calm and comfortable Respiratory system: Bilateral  decreased breath sound at bases with some scattered crackles Cardiovascular system: S1 & S2 heard, rate controlled  Gastrointestinal system: Abdomen is nondistended, soft and nontender. Normal bowel sounds heard. Extremities: No cyanosis, clubbing; Trace edema. left plantar surface of the foot with necrotic ulceration without drainage   Data Reviewed: I have personally reviewed following labs and imaging studies  CBC: Recent Labs  Lab 10/08/17 0500  10/08/17 2256 10/09/17 1059 10/10/17 0415 10/12/17 0131 10/13/17 0329  WBC 11.0*   < > 12.9* 14.1* 15.2* 14.5* 14.6*  NEUTROABS 9.0*  --   --   --  12.7*  --  11.9*  HGB 8.1*   < > 8.3* 8.3* 9.0* 10.0* 8.8*  HCT 24.4*   < > 25.2* 25.9* 28.2* 30.7* 27.4*  MCV 96.1   < > 96.9 97.4 95.9 95.0 95.1  PLT 309   < > 341 348 370 348 312   < > = values in this interval not displayed.   Basic Metabolic Panel: Recent Labs  Lab 10/09/17 0413 10/10/17 0415 10/11/17 0451 10/12/17 0131 10/13/17 0329  NA 144 145 139 134* 135  K 4.1 4.0 4.2 4.1 4.0  CL 115* 116* 112* 108 109  CO2 21* 21* 22 18* 20*  GLUCOSE 119* 127* 157* 172* 175*  BUN 32* 29* 29* 25* 33*  CREATININE 1.98* 1.87* 1.62* 1.72* 2.00*  CALCIUM 8.4* 8.5* 8.5* 8.3* 8.3*  MG  --   --   --   --  1.9   GFR: Estimated Creatinine Clearance: 38.8 mL/min (A) (by C-G formula based on SCr of 2 mg/dL (H)). Liver Function Tests: Recent Labs  Lab 10/07/17 0328 10/08/17 0500 10/13/17 0329  AST 32 22 32  ALT 20 19 8*  ALKPHOS 81 73 145*  BILITOT 0.6 0.9 0.6  PROT 5.5* 5.2* 5.7*  ALBUMIN 2.0* 1.9* 1.8*   No results for input(s): LIPASE, AMYLASE in the last 168 hours. No results for input(s): AMMONIA in the last 168 hours. Coagulation Profile: Recent Labs  Lab 10/12/17 0131  INR 1.35   Cardiac Enzymes: No results for input(s): CKTOTAL, CKMB, CKMBINDEX, TROPONINI in the last 168 hours. BNP (last 3 results) No results for input(s): PROBNP in the last 8760 hours. HbA1C: No  results for input(s): HGBA1C in the last 72 hours. CBG: Recent Labs  Lab 10/12/17 2006 10/12/17 2359 10/13/17 0358 10/13/17 0819 10/13/17 1119  GLUCAP 192* 181* 159* 168* 188*   Lipid Profile: Recent Labs    10/11/17 1405  TRIG 289*  Thyroid Function Tests: No results for input(s): TSH, T4TOTAL, FREET4, T3FREE, THYROIDAB in the last 72 hours. Anemia Panel: No results for input(s): VITAMINB12, FOLATE, FERRITIN, TIBC, IRON, RETICCTPCT in the last 72 hours. Sepsis Labs: Recent Labs  Lab 10/12/17 0131 10/13/17 0329  PROCALCITON 0.41 0.55    Recent Results (from the past 240 hour(s))  MRSA PCR Screening     Status: None   Collection Time: 10/04/17  5:48 AM  Result Value Ref Range Status   MRSA by PCR NEGATIVE NEGATIVE Final    Comment:        The GeneXpert MRSA Assay (FDA approved for NASAL specimens only), is one component of a comprehensive MRSA colonization surveillance program. It is not intended to diagnose MRSA infection nor to guide or monitor treatment for MRSA infections.   Culture, blood (routine x 2)     Status: None (Preliminary result)   Collection Time: 10/12/17  1:55 AM  Result Value Ref Range Status   Specimen Description BLOOD LEFT HAND  Final   Special Requests IN PEDIATRIC BOTTLE Blood Culture adequate volume  Final   Culture  Setup Time   Final    GRAM POSITIVE COCCI IN CLUSTERS IN PEDIATRIC BOTTLE CRITICAL RESULT CALLED TO, READ BACK BY AND VERIFIED WITH: Evelena Peat PHARMD [id number] 10/13/17 A BROWNING    Culture TOO YOUNG TO READ  Final   Report Status PENDING  Incomplete  Culture, Urine     Status: None   Collection Time: 10/12/17  4:50 AM  Result Value Ref Range Status   Specimen Description URINE, CATHETERIZED  Final   Special Requests NONE  Final   Culture NO GROWTH  Final   Report Status 10/13/2017 FINAL  Final         Radiology Studies: Dg Chest 1 View  Result Date: 10/13/2017 CLINICAL DATA:  Shortness of Breath EXAM: CHEST 1  VIEW COMPARISON:  10/10/2017 FINDINGS: Left AICD remains in place, unchanged. Interval extubation. Right PICC line is unchanged. Cardiomegaly with vascular congestion. Low lung volumes with bibasilar atelectasis. IMPRESSION: Interval extubation. Cardiomegaly with vascular congestion, low volumes and bibasilar atelectasis. Electronically Signed   By: Charlett Nose M.D.   On: 10/13/2017 09:05        Scheduled Meds: . Chlorhexidine Gluconate Cloth  6 each Topical Daily  . clopidogrel  75 mg Oral Q breakfast  . collagenase   Topical Daily  . fluconazole  100 mg Oral Daily  . Gerhardt's butt cream   Topical TID  . insulin aspart  0-9 Units Subcutaneous Q4H  . levothyroxine  50 mcg Oral QAC breakfast  . mouth rinse  15 mL Mouth Rinse BID  . metoprolol tartrate  5 mg Intravenous Q8H  . pantoprazole  40 mg Oral BID  . protein supplement shake  11 oz Oral Q24H  . sodium chloride flush  10-40 mL Intracatheter Q12H  . sodium chloride flush  3 mL Intravenous Q12H   Continuous Infusions: . sodium chloride 10 mL/hr (10/12/17 1448)  . sodium chloride    .  ceFAZolin (ANCEF) IV Stopped (10/13/17 3428)     LOS: 16 days        Glade Lloyd, MD [facility] Pager [phone]  If 7PM-7AM, please contact night-coverage [email or web address] Password [email or web address] 10/13/2017, 11:45 AM

## 2017-10-13 NOTE — NC FL2 (Signed)
[place] MEDICAID FL2 LEVEL OF CARE SCREENING TOOL     IDENTIFICATION  Patient Name: [name] Birthdate: [birth year] Sex: male Admission Date (Current Location): 09/27/2017  [facility] and [place] Number:  Producer, television/film/video and Address:  [facility], [address] [place], [place] [address]      Provider Number: [id number]  Attending Physician Name and Address:  Glade Lloyd, MD  Relative Name and Phone Number:  Jusiah Aguayo, spouse, [phone]    Current Level of Care: Hospital Recommended Level of Care: Skilled Nursing Facility Prior Approval Number:    Date Approved/Denied:   PASRR Number: [id number]  Discharge Plan: Home    Current Diagnoses: Patient Active Problem List   Diagnosis Date Noted  . Pressure injury of skin 10/12/2017  . Acute respiratory failure (HCC)   . Acute blood loss anemia   . Hematemesis   . Diabetic foot infection (HCC) 09/27/2017  . Cellulitis of left foot   . Primary osteoarthritis of both hands 06/28/2017  . Primary osteoarthritis of both feet 06/28/2017  . Rheumatoid arthritis involving multiple sites with positive rheumatoid factor (HCC) 03/27/2017  . Pain in both hands 03/24/2017  . H/O total knee replacement, bilateral 03/24/2017  . Foot pain, left 03/24/2017  . High risk medication use 03/24/2017  . Other fatigue 03/24/2017  . History of hypothyroidism 03/24/2017  . Former smoker 03/24/2017  . PVD (peripheral vascular disease) with claudication (HCC) 07/11/2014  . TIA (transient ischemic attack) 05/30/2013  . Long term (current) use of anticoagulants 05/30/2013  . CKD (chronic kidney disease) stage 3, GFR 30-59 ml/min (HCC) 05/29/2013  . Paroxysmal atrial fibrillation (HCC)   . Cardiomyopathy, ischemic   . Biventricular ICD (implantable cardioverter-defibrillator) in place 08/17/2012  . DM 05/15/2010  . Hypertensive cardiovascular disease 04/25/2010  . HYPERLIPIDEMIA-MIXED 08/28/2009  .  CAD, NATIVE VESSEL 08/28/2009  . LBBB 08/28/2009  . SYSTOLIC HEART FAILURE, CHRONIC 08/28/2009    Orientation RESPIRATION BLADDER Height & Weight     Self, Time, Situation, Place  O2(nasal cannula 2L) Continent, External catheter Weight: 262 lb 5.6 oz (119 kg) Height:  5\' 8"  (172.7 cm)  BEHAVIORAL SYMPTOMS/MOOD NEUROLOGICAL BOWEL NUTRITION STATUS      Continent Diet(regular)  AMBULATORY STATUS COMMUNICATION OF NEEDS Skin   Extensive Assist Verbally PU Stage and Appropriate Care(stage II scrotum; open non-pressure wound L foot, gauze)                       Personal Care Assistance Level of Assistance  Bathing, Feeding, Dressing Bathing Assistance: Maximum assistance Feeding assistance: Limited assistance Dressing Assistance: Maximum assistance     Functional Limitations Info  Sight, Hearing, Speech Sight Info: Adequate Hearing Info: Adequate Speech Info: Adequate    SPECIAL CARE FACTORS FREQUENCY  PT (By licensed PT), OT (By licensed OT)     PT Frequency: 5x/week OT Frequency: 5x/week            Contractures Contractures Info: Not present    Additional Factors Info  Code Status, Allergies, Insulin Sliding Scale Code Status Info: Full Allergies Info: Allegra Fexofenadine, Biaxin Clarithromycin, Crestor Rosuvastatin Calcium, Glucophage Metformin Hcl, Trimethoprim, Codeine   Insulin Sliding Scale Info: insulin Q4 hrs       Current Medications (10/13/2017):  This is the current hospital active medication list Current Facility-Administered Medications  Medication Dose Route Frequency Provider Last Rate Last Dose  . 0.9 %  sodium chloride infusion   Intravenous Continuous 10/15/2017,  Mcarthur Rossetti, MD 10 mL/hr at 10/12/17 1448 10 mL/hr at 10/12/17 1448  . 0.9 %  sodium chloride infusion  250 mL Intravenous PRN Nada Libman, MD      . acetaminophen (TYLENOL) tablet 650 mg  650 mg Oral Q4H PRN Nada Libman, MD      . ceFAZolin (ANCEF) IVPB 2g/100 mL premix  2 g  Intravenous Q8H Teressa Lower, [facility]   Stopped at 10/13/17 8416  . Chlorhexidine Gluconate Cloth 2 % PADS 6 each  6 each Topical Daily Lupita Leash, MD   6 each at 10/12/17 2200  . clopidogrel (PLAVIX) tablet 75 mg  75 mg Oral Q breakfast Nada Libman, MD   75 mg at 10/13/17 0909  . collagenase (SANTYL) ointment   Topical Daily Nada Libman, MD      . fentaNYL (SUBLIMAZE) injection 50 mcg  50 mcg Intravenous Q2H PRN Kari Baars, MD   50 mcg at 10/10/17 2211  . fluconazole (DIFLUCAN) tablet 100 mg  100 mg Oral Daily Hanley Ben, Kshitiz, MD   100 mg at 10/13/17 0909  . furosemide (LASIX) injection 40 mg  40 mg Intravenous Once Glade Lloyd, MD      . Gerhardt's butt cream   Topical TID Glade Lloyd, MD      . hydrALAZINE (APRESOLINE) injection 10-20 mg  10-20 mg Intravenous Q6H PRN Canary Brim L, NP   20 mg at 10/11/17 2030  . hydrALAZINE (APRESOLINE) injection 5 mg  5 mg Intravenous Q20 Min PRN Nada Libman, MD      . insulin aspart (novoLOG) injection 0-9 Units  0-9 Units Subcutaneous Q4H Standley Dakins L, MD   2 Units at 10/13/17 0909  . labetalol (NORMODYNE,TRANDATE) injection 10 mg  10 mg Intravenous Q10 min PRN Nada Libman, MD   10 mg at 10/12/17 1510  . levothyroxine (SYNTHROID, LEVOTHROID) tablet 50 mcg  50 mcg Oral QAC breakfast Glade Lloyd, MD   50 mcg at 10/13/17 0909  . MEDLINE mouth rinse  15 mL Mouth Rinse BID Nelda Bucks, MD   15 mL at 10/12/17 2116  . metoprolol tartrate (LOPRESSOR) injection 5 mg  5 mg Intravenous Q8H Ollis, Brandi L, NP   5 mg at 10/13/17 0618  . ondansetron (ZOFRAN) injection 4 mg  4 mg Intravenous Q6H PRN Nada Libman, MD      . pantoprazole (PROTONIX) EC tablet 40 mg  40 mg Oral BID Glade Lloyd, MD   40 mg at 10/13/17 0909  . protein supplement (PREMIER PROTEIN) liquid  11 oz Oral Q24H Nelda Bucks, MD   11 oz at 10/11/17 1201  . sodium chloride flush (NS) 0.9 % injection 10-40 mL  10-40 mL Intracatheter  Q12H Lynnell Jude, MD   10 mL at 10/13/17 0911  . sodium chloride flush (NS) 0.9 % injection 10-40 mL  10-40 mL Intracatheter PRN Lynnell Jude, MD   10 mL at 10/13/17 0334  . sodium chloride flush (NS) 0.9 % injection 3 mL  3 mL Intravenous Q12H Nada Libman, MD   3 mL at 10/13/17 0911  . sodium chloride flush (NS) 0.9 % injection 3 mL  3 mL Intravenous PRN Nada Libman, MD         Discharge Medications: Please see discharge summary for a list of discharge medications.  Relevant Imaging Results:  Relevant Lab Results:   Additional Information SSN: [id number]  Abigail Butts, LCSW

## 2017-10-14 ENCOUNTER — Inpatient Hospital Stay (HOSPITAL_COMMUNITY): Payer: PPO

## 2017-10-14 DIAGNOSIS — R609 Edema, unspecified: Secondary | ICD-10-CM

## 2017-10-14 DIAGNOSIS — E872 Acidosis: Secondary | ICD-10-CM

## 2017-10-14 LAB — TRIGLYCERIDES: Triglycerides: 221 mg/dL — ABNORMAL HIGH (ref <150)

## 2017-10-14 LAB — C-REACTIVE PROTEIN: CRP: 16.3 mg/dL — AB (ref <1.0)

## 2017-10-14 LAB — GLUCOSE, CAPILLARY
GLUCOSE-CAPILLARY: 219 mg/dL — AB (ref 65–99)
GLUCOSE-CAPILLARY: 169 mg/dL — AB (ref 65–99)
GLUCOSE-CAPILLARY: 200 mg/dL — AB (ref 65–99)
Glucose-Capillary: 146 mg/dL — ABNORMAL HIGH (ref 65–99)
Glucose-Capillary: 179 mg/dL — ABNORMAL HIGH (ref 65–99)
Glucose-Capillary: 183 mg/dL — ABNORMAL HIGH (ref 65–99)

## 2017-10-14 LAB — COMPREHENSIVE METABOLIC PANEL
ALT: 6 U/L — AB (ref 17–63)
AST: 23 U/L (ref 15–41)
Albumin: 1.8 g/dL — ABNORMAL LOW (ref 3.5–5.0)
Alkaline Phosphatase: 133 U/L — ABNORMAL HIGH (ref 38–126)
Anion gap: 8 (ref 5–15)
BUN: 35 mg/dL — ABNORMAL HIGH (ref 6–20)
CHLORIDE: 107 mmol/L (ref 101–111)
CO2: 21 mmol/L — ABNORMAL LOW (ref 22–32)
CREATININE: 2.27 mg/dL — AB (ref 0.61–1.24)
Calcium: 8.4 mg/dL — ABNORMAL LOW (ref 8.9–10.3)
GFR, EST AFRICAN AMERICAN: 30 mL/min — AB (ref >60)
GFR, EST NON AFRICAN AMERICAN: 26 mL/min — AB (ref >60)
Glucose, Bld: 171 mg/dL — ABNORMAL HIGH (ref 65–99)
Potassium: 3.6 mmol/L (ref 3.5–5.1)
Sodium: 136 mmol/L (ref 135–145)
Total Bilirubin: 0.5 mg/dL (ref 0.3–1.2)
Total Protein: 5.6 g/dL — ABNORMAL LOW (ref 6.5–8.1)

## 2017-10-14 LAB — CBC WITH DIFFERENTIAL/PLATELET
Basophils Absolute: 0 10*3/uL (ref 0.0–0.1)
Basophils Relative: 0 %
EOS PCT: 2 %
Eosinophils Absolute: 0.3 10*3/uL (ref 0.0–0.7)
HCT: 26.1 % — ABNORMAL LOW (ref 39.0–52.0)
Hemoglobin: 8.5 g/dL — ABNORMAL LOW (ref 13.0–17.0)
LYMPHS ABS: 0.7 10*3/uL (ref 0.7–4.0)
LYMPHS PCT: 5 %
MCH: 30.9 pg (ref 26.0–34.0)
MCHC: 32.6 g/dL (ref 30.0–36.0)
MCV: 94.9 fL (ref 78.0–100.0)
MONO ABS: 1.3 10*3/uL — AB (ref 0.1–1.0)
MONOS PCT: 9 %
Neutro Abs: 12 10*3/uL — ABNORMAL HIGH (ref 1.7–7.7)
Neutrophils Relative %: 84 %
PLATELETS: 309 10*3/uL (ref 150–400)
RBC: 2.75 MIL/uL — AB (ref 4.22–5.81)
RDW: 15.4 % (ref 11.5–15.5)
WBC: 14.4 10*3/uL — ABNORMAL HIGH (ref 4.0–10.5)

## 2017-10-14 LAB — CULTURE, BLOOD (ROUTINE X 2): Special Requests: ADEQUATE

## 2017-10-14 LAB — PROCALCITONIN: Procalcitonin: 0.44 ng/mL

## 2017-10-14 LAB — MAGNESIUM: MAGNESIUM: 1.9 mg/dL (ref 1.7–2.4)

## 2017-10-14 MED ORDER — CEPHALEXIN 250 MG PO CAPS
250.0000 mg | ORAL_CAPSULE | Freq: Three times a day (TID) | ORAL | Status: DC
  Administered 2017-10-14 – 2017-10-15 (×3): 250 mg via ORAL
  Filled 2017-10-14 (×3): qty 1

## 2017-10-14 MED ORDER — HYDROCODONE-ACETAMINOPHEN 5-325 MG PO TABS: 1.0000 | ORAL_TABLET | ORAL | Status: DC | PRN

## 2017-10-14 NOTE — Progress Notes (Signed)
VASCULAR LAB PRELIMINARY  PRELIMINARY  PRELIMINARY  PRELIMINARY  Left upper extremity venous duplex completed.    Preliminary report:  There is no DVT or SVT noted in the visualized veins of left upper extremity.  Shaundra Fullam, RVT 10/14/2017, 2:46 PM

## 2017-10-14 NOTE — Progress Notes (Addendum)
[facility]  Subjective  - Alert, pain in the left arm with edema.   Objective (!) 144/76 83 98.1 F (36.7 C) (Oral) 16 100%  Intake/Output Summary (Last 24 hours) at 10/14/2017 0737 Last data filed at 10/14/2017 6606 Gross per 24 hour  Intake 430 ml  Output 1525 ml  Net -1095 ml    Doppler signal PT/DP/peroneal left LE Foot wrapped santyl recommended by WOC Right groin soft Decub ulcers sacrum and buttocks OU good > 200 /hr Cr 2.27 continuing to rise    Assessment/Planning: POD # 2  Procedure Performed:             1.  Ultrasound-guided access, right femoral artery             2.  Abdominal aortogram with CO2             3.  Left lower extremity arteriogram with CO2             4.  Atherectomy and drug-coated balloon angioplasty x2 of the left superficial femoral and popliteal artery              Impression:             #1 in the proximal superficial femoral artery successfully treated with jetstream atherectomy and 6 x 40 drug-coated balloon angioplasty Lutonix with residual stenosis less than 10% and a low-grade non-flow-limiting dissection 80% stenosis             #2  Near occlusive lesion in the distal above-knee popliteal artery that was heavily calcified successfully treated with jetstream atherectomy and drug-coated balloon angioplasty using a 5 x 100 balloon with no residual stenosis.             #3  Single-vessel runoff via the posterior tibial artery             #4  A total of 19 cc of contrast was administered   Wound care recommended santyl dressing changes daily. Will order left UE DVT study, get 4 inch ace to wrap left UE, removed upper arm peripheral IV.  Elevation. Heel weight bearing. Start asa/plavix for 3 months when ok with GI No other revascularization options we will arrange outpatient follow up with Dr. Myra Gianotti in about 3-4 weeks          [name] 10/14/2017 7:37 AM --  Laboratory Lab  Results: Recent Labs    10/13/17 0329 10/14/17 0436  WBC 14.6* 14.4*  HGB 8.8* 8.5*  HCT 27.4* 26.1*  PLT 312 309   BMET Recent Labs    10/13/17 0329 10/14/17 0436  NA 135 136  K 4.0 3.6  CL 109 107  CO2 20* 21*  GLUCOSE 175* 171*  BUN 33* 35*  CREATININE 2.00* 2.27*  CALCIUM 8.3* 8.4*    COAG Lab Results  Component Value Date   INR 1.35 10/12/2017   INR 1.38 10/05/2017   INR 1.35 10/05/2017   No results found for: PTT

## 2017-10-14 NOTE — Progress Notes (Signed)
Physical Therapy Treatment Patient Details Name: [name] MRN: [id number] DOB: December 30, 1940 Today's Date: 10/14/2017    History of Present Illness 76 year old male with many cormorbidites admitted with left LLE cellulitis, developed GIB and decompensated during EGD requiring intubation.  Tx to [facility] on 11/21 for VVS & GI evaluation.     PT Comments    Pt making slow progress. Continue to recommend SNF at DC.   Follow Up Recommendations  SNF;Supervision/Assistance - 24 hour     Equipment Recommendations  None recommended by PT    Recommendations for Other Services       Precautions / Restrictions Precautions Precautions: Fall Restrictions Weight Bearing Restrictions: Yes LLE Weight Bearing: (wt bearing on lt heal)    Mobility  Bed Mobility Overal bed mobility: Needs Assistance Bed Mobility: Supine to Sit     Supine to sit: +2 for physical assistance;Max assist;HOB elevated     General bed mobility comments: Assist to move legs off bed, elevate trunk into sitting and bring hips to EOB  Transfers Overall transfer level: Needs assistance Equipment used: Ambulation equipment used Transfers: Sit to/from [employer] Sit to Stand: +2 physical assistance;Mod assist Stand pivot transfers: +2 physical assistance(with Stedy)       General transfer comment: Assist to bring hips and trunk up. Pt with rt trunk lean. Stood x 2 from bed with stedy. Pivot to chair with stedy.   Ambulation/Gait             General Gait Details: Unable   Information systems manager Rankin (Stroke Patients Only)       Balance Overall balance assessment: Needs assistance Sitting-balance support: Feet supported;Bilateral upper extremity supported Sitting balance-Leahy Scale: Poor Sitting balance - Comments: supervision to min assist sitting EOB Postural control: Right lateral lean Standing balance support: Bilateral upper extremity  supported Standing balance-Leahy Scale: Zero Standing balance comment: Stood x 2 on Stedy for 30-60 secs. Verbal/tactile cues to stand more erect. Pt propped on forearms.                            Cognition Arousal/Alertness: Awake/alert Behavior During Therapy: WFL for tasks assessed/performed Overall Cognitive Status: Within Functional Limits for tasks assessed                                        Exercises      General Comments        Pertinent Vitals/Pain Pain Assessment: Faces Faces Pain Scale: Hurts little more Pain Location: LLE Pain Descriptors / Indicators: Grimacing;Guarding    Home Living                      Prior Function            PT Goals (current goals can now be found in the care plan section) Acute Rehab PT Goals Patient Stated Goal: get better Progress towards PT goals: Progressing toward goals    Frequency    Min 2X/week      PT Plan Current plan remains appropriate    Co-evaluation              AM-PAC PT "6 Clicks" Daily Activity  Outcome Measure  Difficulty turning over in bed (including adjusting bedclothes,  sheets and blankets)?: Unable Difficulty moving from lying on back to sitting on the side of the bed? : Unable Difficulty sitting down on and standing up from a chair with arms (e.g., wheelchair, bedside commode, etc,.)?: Unable Help needed moving to and from a bed to chair (including a wheelchair)?: Total Help needed walking in hospital room?: Total Help needed climbing 3-5 steps with a railing? : Total 6 Click Score: 6    End of Session Equipment Utilized During Treatment: Gait belt;Oxygen Activity Tolerance: Patient limited by fatigue Patient left: in chair;with call bell/phone within reach;with family/visitor present Nurse Communication: Mobility status;Need for lift equipment(recommend maximove back to bed) PT Visit Diagnosis: Muscle weakness (generalized) (M62.81);Difficulty  in walking, not elsewhere classified (R26.2);Pain Pain - Right/Left: Left Pain - part of body: Ankle and joints of foot     Time: 7858-8502 PT Time Calculation (min) (ACUTE ONLY): 30 min  Charges:  $Therapeutic Activity: 23-37 mins                    G Codes:       [facility] PT [phone]    Angelina Ok [facility] 10/14/2017, 12:23 PM

## 2017-10-14 NOTE — Progress Notes (Cosign Needed)
CSW met with patient and spouse at bedside and they have chosen [facility] in [place]. Patient with questions about being discharged to a wound care center; CSW explained recommendation for SNF and SNF's ability to provide wound care.  CSW received [employer] authorization for patient. Auth [id number] good for 7 days, with tentative expected discharge to [facility] 10/15/17.    CSW to follow and support with discharge when medically ready.  [name], [place]

## 2017-10-14 NOTE — Progress Notes (Signed)
Patient ID: [name], male   DOB: 1941/09/28, 76 y.o.   MRN: [id number]  PROGRESS NOTE    [name]  EFE:[id number] DOB: 11-Feb-1941 DOA: 09/27/2017 PCP: [name], MD   Brief Narrative:  76 year old male with history of diabetes mellitus, coronary artery disease on Plavix, paroxysmal atrial fibrillation on Coumadin, CKD stage III, rheumatoid arthritis, ischemic cardiomyopathy status post ICD presented on 09/27/2017 with 3 day history of increasing pain, edema, erythema in his left foot after stepping on a nail 2 weeks prior. Patient was initially started on broad-spectrum antibiotics which were switched to oral doxycycline. His cellulitis got worse, he was restarted on IV antibiotics. Patient had hematemesis and heme positive stools on 10/04/2017 with hypotension for which he had few units of packed red cells transfusion and vitamin K. GI was consulted. Patient was planned for EGD on 10/05/2017 but patient became hypoxic and cyanotic, had to be intubated and was transferred to [facility]. Patient remained on intravenous PPI therapy and EGD was done on 10/06/2017 which showed a large blood clot covering most of the gastric body. Repeat EGD on 10/07/2017 showed large blood clot again and some linear ulcers and stomach. Hemoglobin stabilized, bleeding stopped; no further plans for EGD from GI. Patient was extubated and was transferred to [facility] for further vascular surgery evaluation and workup as per vascular surgery recommendations.  Patient underwent Aortogram with L SFA/popliteal atherectomy  on 10/12/2017 by vascular surgery.   Assessment & Plan:   Diabetic left foot infection -initially treated with a course of IV vancomycin and Zosyn -then transitioned to IV Ancef -1/2 blood cultures on 1127 with coagulase-negative staph consistent with contamination -will DC Ancef and transitioned to oral Keflex today -With regards to the peripheral arterial disease appreciate  VVS input, status post left SFA and popliteal atherectomy and balloon angioplasty on 11/27, continue Plavix -daily central dressing changes recommended -Follow-up with Dr.Brabham in 3weeks  PAD -as above  Acute hypoxic respiratory failure in the setting of GI bleeding and sedation - Resolved. Currently on 2 L nasal cannula -Chest x-ray yesterday showed vascular congestion and bibasilar atelectasis, s/p IV lasix x1 11/28 -monitor  Hemorrhagic shock - Resolved - Coumadin held, Plavix has been restarted by vascular surgery -monitor CBC  Upper GI bleeding with gastric ulcer and large clot - Status post EGD 2. No plans for further EGD - continue PPI - outpatient follow-up with GI - Tolerating diet. Hemoglobin stable -started on Plavix after athrectomy and balloon angioplasty  Acute blood loss anemia  - secondary to upper GI bleeding and supratherapeutic INR secondary to Coumadin - Resolved. Hemoglobin stable for now -monitor Hb  Chronic kidney disease stage III - Creatinine slightly rising with diuresis -  Paroxysmal atrial fibrillation - Rate controlled. Coumadin held, restart in few weeks if Hb remains stable, especially given significant GI bleed this admission and now requiring Plavix   History of coronary artery disease and ischemic cardiomyopathy -Plavix restarted by vascular surgery. Outpatient follow-up with cardiology  Diabetes mellitus type 2 - continue Accu-Cheks with coverage  DVT prophylaxis: SCDs Code Status:  full Family Communication: none at bedside Disposition Plan: SNF tomorrow if stable  Consultants: PCCM/vascular surgery/GI  Procedures: EGD on 10/06/2017 and 10/07/2017 Intubation and extubation L SFA/popliteal atherectomy and balloon angioplasty  on 10/12/2017 by vascular surgery  Antimicrobials:  Anti-infectives (From admission, onward)   Start     Dose/Rate Route Frequency Ordered Stop   10/12/17 1630  fluconazole (DIFLUCAN) tablet 100  mg       100 mg Oral Daily 10/12/17 1327     10/08/17 1400  ceFAZolin (ANCEF) IVPB 2g/100 mL premix     2 g 200 mL/hr over 30 Minutes Intravenous Every 8 hours 10/08/17 1115     10/07/17 2200  vancomycin (VANCOCIN) IVPB 750 mg/150 ml premix  Status:  Discontinued     750 mg 150 mL/hr over 60 Minutes Intravenous Every 24 hours 10/06/17 2118 10/08/17 1116   10/06/17 2200  vancomycin (VANCOCIN) IVPB 750 mg/150 ml premix  Status:  Discontinued     750 mg 150 mL/hr over 60 Minutes Intravenous Every 24 hours 10/06/17 2118 10/06/17 2118   10/06/17 1545  erythromycin 500 mg in sodium chloride 0.9 % 100 mL IVPB  Status:  Discontinued     500 mg 100 mL/hr over 60 Minutes Intravenous Every 8 hours 10/06/17 1544 10/06/17 1902   10/03/17 2100  vancomycin (VANCOCIN) IVPB 1000 mg/200 mL premix  Status:  Discontinued     1,000 mg 200 mL/hr over 60 Minutes Intravenous Every 24 hours 10/02/17 2058 10/06/17 2118   09/29/17 2000  vancomycin (VANCOCIN) 1,500 mg in sodium chloride 0.9 % 500 mL IVPB  Status:  Discontinued     1,500 mg 250 mL/hr over 120 Minutes Intravenous Every 48 hours 09/27/17 1808 09/28/17 1100   09/29/17 2000  vancomycin (VANCOCIN) 1,250 mg in sodium chloride 0.9 % 250 mL IVPB  Status:  Discontinued     1,250 mg 166.7 mL/hr over 90 Minutes Intravenous Every 24 hours 09/29/17 1959 10/02/17 2058   09/29/17 2000  piperacillin-tazobactam (ZOSYN) IVPB 3.375 g  Status:  Discontinued     3.375 g 12.5 mL/hr over 240 Minutes Intravenous Every 8 hours 09/29/17 1959 10/08/17 1115   09/28/17 2200  doxycycline (VIBRA-TABS) tablet 100 mg  Status:  Discontinued     100 mg Oral Every 12 hours 09/28/17 1904 09/29/17 1751   09/28/17 2000  vancomycin (VANCOCIN) 1,250 mg in sodium chloride 0.9 % 250 mL IVPB  Status:  Discontinued     1,250 mg 166.7 mL/hr over 90 Minutes Intravenous Every 24 hours 09/28/17 1100 09/28/17 1904   09/27/17 2100  ceFEPIme (MAXIPIME) 2 g in dextrose 5 % 50 mL IVPB  Status:  Discontinued      2 g 100 mL/hr over 30 Minutes Intravenous Every 24 hours 09/27/17 1808 09/28/17 1904   09/27/17 2000  vancomycin (VANCOCIN) IVPB 1000 mg/200 mL premix     1,000 mg 200 mL/hr over 60 Minutes Intravenous  Once 09/27/17 1808 09/27/17 2100   09/27/17 1245  vancomycin (VANCOCIN) IVPB 1000 mg/200 mL premix     1,000 mg 200 mL/hr over 60 Minutes Intravenous  Once 09/27/17 1238 09/27/17 1440   09/27/17 1245  piperacillin-tazobactam (ZOSYN) IVPB 3.375 g     3.375 g 100 mL/hr over 30 Minutes Intravenous  Once 09/27/17 1238 09/27/17 1405         Subjective: -feels ok, breathing ok, no chest pain  Objective: Vitals:   10/14/17 0000 10/14/17 0400 10/14/17 0833 10/14/17 1226  BP: 139/63 (!) 144/76 (!) 142/59 138/67  Pulse: 80 83 81 91  Resp: 19 16 14 17   Temp: 98.1 F (36.7 C) 98.1 F (36.7 C) 97.7 F (36.5 C) 97.8 F (36.6 C)  TempSrc: Axillary Oral Oral Oral  SpO2: 100% 100% 98% 97%  Weight:      Height:        Intake/Output Summary (Last 24 hours) at 10/14/2017 1333  Last data filed at 10/14/2017 1003 Gross per 24 hour  Intake 190 ml  Output 1700 ml  Net -1510 ml   Filed Weights   10/07/17 0500 10/11/17 0600 10/11/17 2137  Weight: 122.9 kg (270 lb 15.1 oz) 116.3 kg (256 lb 6.3 oz) 119 kg (262 lb 5.6 oz)    Examination:  Gen: Awake, Alert, Oriented X 3,  HEENT: PERRLA, Neck supple, no JVD Lungs: decreased BS at bases CVS: RRR,No Gallops,Rubs or new Murmurs Abd: soft, Non tender, non distended, BS present Extremities: L foot with ulceration and dressing, 1plus edema, LUE >RUE Skin: as above    Data Reviewed: I have personally reviewed following labs and imaging studies  CBC: Recent Labs  Lab 10/08/17 0500  10/09/17 1059 10/10/17 0415 10/12/17 0131 10/13/17 0329 10/14/17 0436  WBC 11.0*   < > 14.1* 15.2* 14.5* 14.6* 14.4*  NEUTROABS 9.0*  --   --  12.7*  --  11.9* 12.0*  HGB 8.1*   < > 8.3* 9.0* 10.0* 8.8* 8.5*  HCT 24.4*   < > 25.9* 28.2* 30.7* 27.4*  26.1*  MCV 96.1   < > 97.4 95.9 95.0 95.1 94.9  PLT 309   < > 348 370 348 312 309   < > = values in this interval not displayed.   Basic Metabolic Panel: Recent Labs  Lab 10/10/17 0415 10/11/17 0451 10/12/17 0131 10/13/17 0329 10/14/17 0436  NA 145 139 134* 135 136  K 4.0 4.2 4.1 4.0 3.6  CL 116* 112* 108 109 107  CO2 21* 22 18* 20* 21*  GLUCOSE 127* 157* 172* 175* 171*  BUN 29* 29* 25* 33* 35*  CREATININE 1.87* 1.62* 1.72* 2.00* 2.27*  CALCIUM 8.5* 8.5* 8.3* 8.3* 8.4*  MG  --   --   --  1.9 1.9   GFR: Estimated Creatinine Clearance: 34.2 mL/min (A) (by C-G formula based on SCr of 2.27 mg/dL (H)). Liver Function Tests: Recent Labs  Lab 10/08/17 0500 10/13/17 0329 10/14/17 0436  AST 22 32 23  ALT 19 8* 6*  ALKPHOS 73 145* 133*  BILITOT 0.9 0.6 0.5  PROT 5.2* 5.7* 5.6*  ALBUMIN 1.9* 1.8* 1.8*   No results for input(s): LIPASE, AMYLASE in the last 168 hours. No results for input(s): AMMONIA in the last 168 hours. Coagulation Profile: Recent Labs  Lab 10/12/17 0131  INR 1.35   Cardiac Enzymes: No results for input(s): CKTOTAL, CKMB, CKMBINDEX, TROPONINI in the last 168 hours. BNP (last 3 results) No results for input(s): PROBNP in the last 8760 hours. HbA1C: No results for input(s): HGBA1C in the last 72 hours. CBG: Recent Labs  Lab 10/13/17 2014 10/13/17 2359 10/14/17 0521 10/14/17 0807 10/14/17 1223  GLUCAP 156* 155* 146* 179* 219*   Lipid Profile: Recent Labs    10/11/17 1405  TRIG 289*   Thyroid Function Tests: No results for input(s): TSH, T4TOTAL, FREET4, T3FREE, THYROIDAB in the last 72 hours. Anemia Panel: No results for input(s): VITAMINB12, FOLATE, FERRITIN, TIBC, IRON, RETICCTPCT in the last 72 hours. Sepsis Labs: Recent Labs  Lab 10/12/17 0131 10/13/17 0329 10/14/17 0436  PROCALCITON 0.41 0.55 0.44    Recent Results (from the past 240 hour(s))  Culture, blood (routine x 2)     Status: None (Preliminary result)   Collection  Time: 10/12/17  1:29 AM  Result Value Ref Range Status   Specimen Description BLOOD LEFT ANTECUBITAL  Final   Special Requests   Final    BOTTLES DRAWN  AEROBIC ONLY Blood Culture adequate volume   Culture NO GROWTH 1 DAY  Final   Report Status PENDING  Incomplete  Culture, blood (routine x 2)     Status: Abnormal   Collection Time: 10/12/17  1:55 AM  Result Value Ref Range Status   Specimen Description BLOOD LEFT HAND  Final   Special Requests IN PEDIATRIC BOTTLE Blood Culture adequate volume  Final   Culture  Setup Time   Final    GRAM POSITIVE COCCI IN CLUSTERS IN PEDIATRIC BOTTLE CRITICAL RESULT CALLED TO, READ BACK BY AND VERIFIED WITH: Evelena Peat PHARMD 1224 10/13/17 A BROWNING    Culture (A)  Final    STAPHYLOCOCCUS SPECIES (COAGULASE NEGATIVE) THE SIGNIFICANCE OF ISOLATING THIS ORGANISM FROM A SINGLE SET OF BLOOD CULTURES WHEN MULTIPLE SETS ARE DRAWN IS UNCERTAIN. PLEASE NOTIFY THE MICROBIOLOGY DEPARTMENT WITHIN ONE WEEK IF SPECIATION AND SENSITIVITIES ARE REQUIRED.    Report Status 10/14/2017 FINAL  Final  Culture, Urine     Status: None   Collection Time: 10/12/17  4:50 AM  Result Value Ref Range Status   Specimen Description URINE, CATHETERIZED  Final   Special Requests NONE  Final   Culture NO GROWTH  Final   Report Status 10/13/2017 FINAL  Final  Culture, blood (routine x 2)     Status: None (Preliminary result)   Collection Time: 10/13/17  1:49 PM  Result Value Ref Range Status   Specimen Description BLOOD PICC LINE  Final   Special Requests   Final    BOTTLES DRAWN AEROBIC AND ANAEROBIC Blood Culture adequate volume   Culture PENDING  Incomplete   Report Status PENDING  Incomplete         Radiology Studies: Dg Chest 1 View  Result Date: 10/13/2017 CLINICAL DATA:  Shortness of Breath EXAM: CHEST 1 VIEW COMPARISON:  10/10/2017 FINDINGS: Left AICD remains in place, unchanged. Interval extubation. Right PICC line is unchanged. Cardiomegaly with vascular  congestion. Low lung volumes with bibasilar atelectasis. IMPRESSION: Interval extubation. Cardiomegaly with vascular congestion, low volumes and bibasilar atelectasis. Electronically Signed   By: Charlett Nose M.D.   On: 10/13/2017 09:05        Scheduled Meds: . Chlorhexidine Gluconate Cloth  6 each Topical Daily  . clopidogrel  75 mg Oral Q breakfast  . collagenase   Topical Daily  . fluconazole  100 mg Oral Daily  . Gerhardt's butt cream   Topical TID  . insulin aspart  0-9 Units Subcutaneous Q4H  . levothyroxine  50 mcg Oral QAC breakfast  . mouth rinse  15 mL Mouth Rinse BID  . metoprolol tartrate  5 mg Intravenous Q8H  . pantoprazole  40 mg Oral BID  . protein supplement shake  11 oz Oral Q24H  . sodium chloride flush  10-40 mL Intracatheter Q12H  . sodium chloride flush  3 mL Intravenous Q12H   Continuous Infusions: . sodium chloride 10 mL/hr (10/12/17 1448)  . sodium chloride    .  ceFAZolin (ANCEF) IV 2 g (10/14/17 1318)     LOS: 17 days        Zannie Cove, MD [facility] Page via [email or web address] password [email or web address]  If 7PM-7AM, please contact night-coverage [email or web address] Password [email or web address] 10/14/2017, 1:32 PM

## 2017-10-15 DIAGNOSIS — I480 Paroxysmal atrial fibrillation: Secondary | ICD-10-CM | POA: Diagnosis not present

## 2017-10-15 DIAGNOSIS — D62 Acute posthemorrhagic anemia: Secondary | ICD-10-CM | POA: Diagnosis not present

## 2017-10-15 DIAGNOSIS — E872 Acidosis: Secondary | ICD-10-CM | POA: Diagnosis not present

## 2017-10-15 DIAGNOSIS — I214 Non-ST elevation (NSTEMI) myocardial infarction: Secondary | ICD-10-CM | POA: Diagnosis not present

## 2017-10-15 DIAGNOSIS — E119 Type 2 diabetes mellitus without complications: Secondary | ICD-10-CM | POA: Diagnosis not present

## 2017-10-15 DIAGNOSIS — Z96653 Presence of artificial knee joint, bilateral: Secondary | ICD-10-CM | POA: Diagnosis not present

## 2017-10-15 DIAGNOSIS — R319 Hematuria, unspecified: Secondary | ICD-10-CM | POA: Diagnosis not present

## 2017-10-15 DIAGNOSIS — R51 Headache: Secondary | ICD-10-CM | POA: Diagnosis not present

## 2017-10-15 DIAGNOSIS — E039 Hypothyroidism, unspecified: Secondary | ICD-10-CM | POA: Diagnosis not present

## 2017-10-15 DIAGNOSIS — N400 Enlarged prostate without lower urinary tract symptoms: Secondary | ICD-10-CM | POA: Diagnosis not present

## 2017-10-15 DIAGNOSIS — I129 Hypertensive chronic kidney disease with stage 1 through stage 4 chronic kidney disease, or unspecified chronic kidney disease: Secondary | ICD-10-CM | POA: Diagnosis not present

## 2017-10-15 DIAGNOSIS — Z5181 Encounter for therapeutic drug level monitoring: Secondary | ICD-10-CM | POA: Diagnosis not present

## 2017-10-15 DIAGNOSIS — Z79899 Other long term (current) drug therapy: Secondary | ICD-10-CM | POA: Diagnosis not present

## 2017-10-15 DIAGNOSIS — L8992 Pressure ulcer of unspecified site, stage 2: Secondary | ICD-10-CM | POA: Diagnosis not present

## 2017-10-15 DIAGNOSIS — Z23 Encounter for immunization: Secondary | ICD-10-CM | POA: Diagnosis not present

## 2017-10-15 DIAGNOSIS — L03116 Cellulitis of left lower limb: Secondary | ICD-10-CM | POA: Diagnosis not present

## 2017-10-15 DIAGNOSIS — J9601 Acute respiratory failure with hypoxia: Secondary | ICD-10-CM | POA: Diagnosis not present

## 2017-10-15 DIAGNOSIS — M519 Unspecified thoracic, thoracolumbar and lumbosacral intervertebral disc disorder: Secondary | ICD-10-CM | POA: Diagnosis not present

## 2017-10-15 DIAGNOSIS — S90929A Unspecified superficial injury of unspecified foot, initial encounter: Secondary | ICD-10-CM | POA: Diagnosis not present

## 2017-10-15 DIAGNOSIS — B999 Unspecified infectious disease: Secondary | ICD-10-CM | POA: Diagnosis not present

## 2017-10-15 DIAGNOSIS — I6789 Other cerebrovascular disease: Secondary | ICD-10-CM | POA: Diagnosis not present

## 2017-10-15 DIAGNOSIS — I2510 Atherosclerotic heart disease of native coronary artery without angina pectoris: Secondary | ICD-10-CM | POA: Diagnosis not present

## 2017-10-15 DIAGNOSIS — E1151 Type 2 diabetes mellitus with diabetic peripheral angiopathy without gangrene: Secondary | ICD-10-CM | POA: Diagnosis not present

## 2017-10-15 DIAGNOSIS — I110 Hypertensive heart disease with heart failure: Secondary | ICD-10-CM | POA: Diagnosis not present

## 2017-10-15 DIAGNOSIS — N390 Urinary tract infection, site not specified: Secondary | ICD-10-CM | POA: Diagnosis not present

## 2017-10-15 DIAGNOSIS — I255 Ischemic cardiomyopathy: Secondary | ICD-10-CM | POA: Diagnosis not present

## 2017-10-15 DIAGNOSIS — E1122 Type 2 diabetes mellitus with diabetic chronic kidney disease: Secondary | ICD-10-CM | POA: Diagnosis not present

## 2017-10-15 DIAGNOSIS — N189 Chronic kidney disease, unspecified: Secondary | ICD-10-CM | POA: Diagnosis not present

## 2017-10-15 DIAGNOSIS — R2689 Other abnormalities of gait and mobility: Secondary | ICD-10-CM | POA: Diagnosis not present

## 2017-10-15 DIAGNOSIS — A419 Sepsis, unspecified organism: Secondary | ICD-10-CM | POA: Diagnosis not present

## 2017-10-15 DIAGNOSIS — E1169 Type 2 diabetes mellitus with other specified complication: Secondary | ICD-10-CM | POA: Diagnosis not present

## 2017-10-15 DIAGNOSIS — N19 Unspecified kidney failure: Secondary | ICD-10-CM | POA: Diagnosis not present

## 2017-10-15 DIAGNOSIS — N179 Acute kidney failure, unspecified: Secondary | ICD-10-CM | POA: Diagnosis not present

## 2017-10-15 DIAGNOSIS — N183 Chronic kidney disease, stage 3 (moderate): Secondary | ICD-10-CM | POA: Diagnosis not present

## 2017-10-15 DIAGNOSIS — M109 Gout, unspecified: Secondary | ICD-10-CM | POA: Diagnosis not present

## 2017-10-15 DIAGNOSIS — I739 Peripheral vascular disease, unspecified: Secondary | ICD-10-CM | POA: Diagnosis not present

## 2017-10-15 DIAGNOSIS — R402441 Other coma, without documented Glasgow coma scale score, or with partial score reported, in the field [EMT or ambulance]: Secondary | ICD-10-CM | POA: Diagnosis not present

## 2017-10-15 DIAGNOSIS — M069 Rheumatoid arthritis, unspecified: Secondary | ICD-10-CM | POA: Diagnosis not present

## 2017-10-15 DIAGNOSIS — M6281 Muscle weakness (generalized): Secondary | ICD-10-CM | POA: Diagnosis not present

## 2017-10-15 DIAGNOSIS — I509 Heart failure, unspecified: Secondary | ICD-10-CM | POA: Diagnosis not present

## 2017-10-15 DIAGNOSIS — L089 Local infection of the skin and subcutaneous tissue, unspecified: Secondary | ICD-10-CM | POA: Diagnosis not present

## 2017-10-15 DIAGNOSIS — Z8711 Personal history of peptic ulcer disease: Secondary | ICD-10-CM | POA: Diagnosis not present

## 2017-10-15 DIAGNOSIS — Z79891 Long term (current) use of opiate analgesic: Secondary | ICD-10-CM | POA: Diagnosis not present

## 2017-10-15 DIAGNOSIS — J80 Acute respiratory distress syndrome: Secondary | ICD-10-CM | POA: Diagnosis not present

## 2017-10-15 DIAGNOSIS — K922 Gastrointestinal hemorrhage, unspecified: Secondary | ICD-10-CM | POA: Diagnosis not present

## 2017-10-15 DIAGNOSIS — J810 Acute pulmonary edema: Secondary | ICD-10-CM | POA: Diagnosis not present

## 2017-10-15 DIAGNOSIS — F4489 Other dissociative and conversion disorders: Secondary | ICD-10-CM | POA: Diagnosis not present

## 2017-10-15 DIAGNOSIS — I249 Acute ischemic heart disease, unspecified: Secondary | ICD-10-CM | POA: Diagnosis not present

## 2017-10-15 LAB — GLUCOSE, CAPILLARY
Glucose-Capillary: 172 mg/dL — ABNORMAL HIGH (ref 65–99)
Glucose-Capillary: 160 mg/dL — ABNORMAL HIGH (ref 65–99)
Glucose-Capillary: 180 mg/dL — ABNORMAL HIGH (ref 65–99)

## 2017-10-15 LAB — BASIC METABOLIC PANEL
ANION GAP: 6 (ref 5–15)
BUN: 34 mg/dL — ABNORMAL HIGH (ref 6–20)
CHLORIDE: 105 mmol/L (ref 101–111)
CO2: 21 mmol/L — AB (ref 22–32)
Calcium: 8.7 mg/dL — ABNORMAL LOW (ref 8.9–10.3)
Creatinine, Ser: 2.4 mg/dL — ABNORMAL HIGH (ref 0.61–1.24)
GFR calc non Af Amer: 24 mL/min — ABNORMAL LOW (ref >60)
GFR, EST AFRICAN AMERICAN: 28 mL/min — AB (ref >60)
Glucose, Bld: 162 mg/dL — ABNORMAL HIGH (ref 65–99)
POTASSIUM: 3.4 mmol/L — AB (ref 3.5–5.1)
SODIUM: 132 mmol/L — AB (ref 135–145)

## 2017-10-15 LAB — CBC
HEMATOCRIT: 25.6 % — AB (ref 39.0–52.0)
HEMOGLOBIN: 8.3 g/dL — AB (ref 13.0–17.0)
MCH: 30.5 pg (ref 26.0–34.0)
MCHC: 32.4 g/dL (ref 30.0–36.0)
MCV: 94.1 fL (ref 78.0–100.0)
Platelets: 294 10*3/uL (ref 150–400)
RBC: 2.72 MIL/uL — AB (ref 4.22–5.81)
RDW: 15.5 % (ref 11.5–15.5)
WBC: 14.2 10*3/uL — ABNORMAL HIGH (ref 4.0–10.5)

## 2017-10-15 MED ORDER — INSULIN LISPRO PROT & LISPRO (75-25 MIX) 100 UNIT/ML ~~LOC~~ SUSP: 10.0000 [IU] | Freq: Two times a day (BID) | SUBCUTANEOUS | Status: AC

## 2017-10-15 MED ORDER — PANTOPRAZOLE SODIUM 40 MG PO TBEC: 40.0000 mg | DELAYED_RELEASE_TABLET | Freq: Two times a day (BID) | ORAL | Status: DC

## 2017-10-15 MED ORDER — HYDROCODONE-ACETAMINOPHEN 7.5-325 MG PO TABS: 1.0000 | ORAL_TABLET | Freq: Four times a day (QID) | ORAL | 0 refills | Status: AC | PRN

## 2017-10-15 MED ORDER — CEPHALEXIN 250 MG PO CAPS: 250.0000 mg | ORAL_CAPSULE | Freq: Three times a day (TID) | ORAL | 0 refills | Status: DC

## 2017-10-15 MED ORDER — CARVEDILOL 12.5 MG PO TABS
12.5000 mg | ORAL_TABLET | Freq: Two times a day (BID) | ORAL | Status: DC
  Administered 2017-10-15: 12.5 mg via ORAL

## 2017-10-15 NOTE — Progress Notes (Signed)
Patient in a stable condition, discharge education reviewed with patient, he verbalized understanding, picc line dc by IV team, foley in place, report called to nurse [facility], patient awaiting PTAR for transportation

## 2017-10-15 NOTE — Discharge Summary (Addendum)
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 3.6 3.4*  CL 112* 108 109 107 105  CO2 22 18* 20* 21* 21*  GLUCOSE 157* 172* 175* 171* 162*  BUN 29* 25* 33* 35* 34*  CREATININE 1.62* 1.72* 2.00* 2.27* 2.40*  CALCIUM 8.5* 8.3* 8.3* 8.4* 8.7*  MG  --   --  1.9 1.9  --    Liver Function Tests: Recent Labs  Lab 10/13/17 0329 10/14/17 0436  AST 32 23  ALT 8* 6*  ALKPHOS 145* 133*  BILITOT 0.6 0.5  PROT 5.7* 5.6*  ALBUMIN 1.8* 1.8*   No results for input(s): LIPASE, AMYLASE in the last 168 hours. No results for input(s): AMMONIA in the last 168 hours. CBC: Recent Labs  Lab 10/10/17 0415 10/12/17 0131 10/13/17 0329 10/14/17 0436 10/15/17 0535  WBC 15.2* 14.5* 14.6* 14.4* 14.2*  NEUTROABS 12.7*  --  11.9* 12.0*  --   HGB 9.0* 10.0* 8.8* 8.5* 8.3*  HCT 28.2* 30.7* 27.4* 26.1* 25.6*  MCV 95.9 95.0 95.1 94.9 94.1  PLT 370 348 312 309 294   Cardiac Enzymes: No results for input(s): CKTOTAL, CKMB,  CKMBINDEX, TROPONINI in the last 168 hours. BNP:  BNP (last 3 results) Recent Labs    10/06/17 0445 10/08/17 0500  BNP 501.7* 498.0*    ProBNP (last 3 results) No results for input(s): PROBNP in the last 8760 hours.  CBG: Recent Labs  Lab 10/14/17 1647 10/14/17 2106 10/14/17 2356 10/15/17 0411 10/15/17 0819  GLUCAP 169* 200* 183* 172* 160*       Signed:  Zannie Cove MD.  [facility] 10/15/2017, 11:44 AM

## 2017-10-15 NOTE — Progress Notes (Signed)
Tele dc ccmd notified, Patient transported to [facility] nursing by [employer]

## 2017-10-15 NOTE — Care Management Important Message (Signed)
Important Message  Patient Details  Name: [name] MRN: [id number] Date of Birth: Dec 03, 1940   Medicare Important Message Given:  Yes    Kyla Balzarine 10/15/2017, 11:31 AM

## 2017-10-15 NOTE — Care Management Note (Signed)
Case Management Note Original Note Created [place], Chrystine Oiler, RN 10/05/2017, 10:09 AM   Patient Details  Name: [name] MRN: [id number] Date of Birth: May 09, 1941  Subjective/Objective: Adm from home with Left foot cellulitis. Lives with wife, ind with ADL's. Has RW already at home if needed. Patient developed hematochezia and hematemesis, scheduled EGD today. No HH pta.                  Action/Plan:Anticipate DC home with self care. Patient evaluated by PT and no recommendations made for home health. If patient need [facility] RN, patient has had [facility] at home in the past.   Expected Discharge Date:  10/15/17               Expected Discharge Plan:  Skilled Nursing Facility  In-House Referral:  Clinical Social Work  Discharge planning Services  CM Consult  Post Acute Care Choice:  NA Choice offered to:  NA  DME Arranged:    DME Agency:     HH Arranged:    HH Agency:     Status of Service:  Completed, signed off  If discussed at [employer], dates discussed:    Discharge Disposition: skilled facility   Additional Comments:  10/15/17- 1420- Thelda Gagan RN, CM- pt for d/c today- plan for [facility] per CSW- CSW to follow for placement needs.   10/12/17- 1520- Juelz Claar RN, CM- pt tx from [facility]. On 10/11/17 for further Vascular work up-  Arteriogram done today with angioplasty- PT recommendations for SNF- CSW to follow for placement needs- pt remains on IV abx. - CM will follow   Darrold Span, RN 10/15/2017, 2:23 PM [phone]

## 2017-10-15 NOTE — Clinical Social Work Placement (Signed)
   CLINICAL SOCIAL WORK PLACEMENT  NOTE  Date:  10/15/2017  Patient Details  Name: [name] MRN: [id number] Date of Birth: 1941/11/09  Clinical Social Work is seeking post-discharge placement for this patient at the Skilled  Nursing Facility level of care (*CSW will initial, date and re-position this form in  chart as items are completed):  Yes   Patient/family provided with [place] Clinical Social Work Department's list of facilities offering this level of care within the geographic area requested by the patient (or if unable, by the patient's family).  Yes   Patient/family informed of their freedom to choose among providers that offer the needed level of care, that participate in Medicare, Medicaid or managed care program needed by the patient, have an available bed and are willing to accept the patient.  Yes   Patient/family informed of [facility]'s ownership interest in [facility] and [facility], as well as of the fact that they are under no obligation to receive care at these facilities.  PASRR submitted to EDS on 10/13/17     PASRR number received on 10/13/17     Existing PASRR number confirmed on       FL2 transmitted to all facilities in geographic area requested by pt/family on 10/13/17     FL2 transmitted to all facilities within larger geographic area on       Patient informed that his/her managed care company has contracts with or will negotiate with certain facilities, including the following:  [facility]     Yes   Patient/family informed of bed offers received.  Patient chooses bed at [facility]     Physician recommends and patient chooses bed at      Patient to be transferred to [facility] on 10/15/17.  Patient to be transferred to facility by [facility]     Patient family notified on 10/15/17 of transfer.  Name of family member notified:  Tyriek Hofman, spouse     PHYSICIAN Please prepare priority  discharge summary, including medications, Please prepare prescriptions     Additional Comment:    _______________________________________________ Abigail Butts, LCSW 10/15/2017, 10:38 AM

## 2017-10-15 NOTE — Progress Notes (Signed)
Patient will discharge to [facility] Nursing Anticipated discharge date: 10/15/17 Family notified: Colsen Modi, spouse Transportation by: Sharin Mons  Nurse to call report to [phone]. Patient will go to room 127 on [place] at the facility.   CSW signing off.  Abigail Butts, LCSWA  Clinical Social Worker

## 2017-10-16 DIAGNOSIS — N3090 Cystitis, unspecified without hematuria: Secondary | ICD-10-CM

## 2017-10-16 HISTORY — DX: Cystitis, unspecified without hematuria: N30.90

## 2017-10-17 LAB — CULTURE, BLOOD (ROUTINE X 2)
CULTURE: NO GROWTH
Special Requests: ADEQUATE

## 2017-10-18 DIAGNOSIS — K922 Gastrointestinal hemorrhage, unspecified: Secondary | ICD-10-CM | POA: Diagnosis not present

## 2017-10-18 DIAGNOSIS — Z8711 Personal history of peptic ulcer disease: Secondary | ICD-10-CM | POA: Diagnosis not present

## 2017-10-18 DIAGNOSIS — I480 Paroxysmal atrial fibrillation: Secondary | ICD-10-CM | POA: Diagnosis not present

## 2017-10-18 DIAGNOSIS — N183 Chronic kidney disease, stage 3 (moderate): Secondary | ICD-10-CM | POA: Diagnosis not present

## 2017-10-18 LAB — CULTURE, BLOOD (SINGLE)
CULTURE: NO GROWTH
Special Requests: ADEQUATE

## 2017-10-18 LAB — CULTURE, BLOOD (ROUTINE X 2)
Culture: NO GROWTH
Special Requests: ADEQUATE

## 2017-10-19 DIAGNOSIS — M519 Unspecified thoracic, thoracolumbar and lumbosacral intervertebral disc disorder: Secondary | ICD-10-CM | POA: Diagnosis not present

## 2017-10-19 DIAGNOSIS — M069 Rheumatoid arthritis, unspecified: Secondary | ICD-10-CM | POA: Diagnosis not present

## 2017-10-19 DIAGNOSIS — Z79891 Long term (current) use of opiate analgesic: Secondary | ICD-10-CM | POA: Diagnosis not present

## 2017-10-19 DIAGNOSIS — Z5181 Encounter for therapeutic drug level monitoring: Secondary | ICD-10-CM | POA: Diagnosis not present

## 2017-10-22 DIAGNOSIS — N19 Unspecified kidney failure: Secondary | ICD-10-CM | POA: Diagnosis not present

## 2017-10-22 DIAGNOSIS — Z79899 Other long term (current) drug therapy: Secondary | ICD-10-CM | POA: Diagnosis not present

## 2017-10-22 DIAGNOSIS — J810 Acute pulmonary edema: Secondary | ICD-10-CM | POA: Diagnosis not present

## 2017-10-22 DIAGNOSIS — I509 Heart failure, unspecified: Secondary | ICD-10-CM | POA: Diagnosis not present

## 2017-10-22 DIAGNOSIS — L03116 Cellulitis of left lower limb: Secondary | ICD-10-CM | POA: Diagnosis not present

## 2017-10-22 DIAGNOSIS — I249 Acute ischemic heart disease, unspecified: Secondary | ICD-10-CM | POA: Diagnosis not present

## 2017-10-22 DIAGNOSIS — R51 Headache: Secondary | ICD-10-CM | POA: Diagnosis not present

## 2017-10-22 DIAGNOSIS — Z4682 Encounter for fitting and adjustment of non-vascular catheter: Secondary | ICD-10-CM | POA: Diagnosis not present

## 2017-10-22 DIAGNOSIS — N390 Urinary tract infection, site not specified: Secondary | ICD-10-CM | POA: Diagnosis not present

## 2017-10-22 DIAGNOSIS — R319 Hematuria, unspecified: Secondary | ICD-10-CM | POA: Diagnosis not present

## 2017-10-22 DIAGNOSIS — E119 Type 2 diabetes mellitus without complications: Secondary | ICD-10-CM | POA: Diagnosis not present

## 2017-10-22 DIAGNOSIS — I110 Hypertensive heart disease with heart failure: Secondary | ICD-10-CM | POA: Diagnosis not present

## 2017-10-22 DIAGNOSIS — Z96653 Presence of artificial knee joint, bilateral: Secondary | ICD-10-CM | POA: Diagnosis not present

## 2017-10-22 DIAGNOSIS — I214 Non-ST elevation (NSTEMI) myocardial infarction: Secondary | ICD-10-CM | POA: Diagnosis not present

## 2017-10-22 DIAGNOSIS — J9811 Atelectasis: Secondary | ICD-10-CM | POA: Diagnosis not present

## 2017-10-22 DIAGNOSIS — A419 Sepsis, unspecified organism: Secondary | ICD-10-CM | POA: Diagnosis not present

## 2017-10-23 ENCOUNTER — Inpatient Hospital Stay (HOSPITAL_COMMUNITY): Payer: PPO

## 2017-10-23 ENCOUNTER — Inpatient Hospital Stay (HOSPITAL_COMMUNITY)
Admission: AD | Admit: 2017-10-23 | Discharge: 2017-11-06 | DRG: 871 | Disposition: A | Discharge status: Home Health | Payer: PPO | Source: Other Acute Inpatient Hospital | Attending: Family Medicine | Admitting: Family Medicine

## 2017-10-23 ENCOUNTER — Other Ambulatory Visit: Payer: Self-pay

## 2017-10-23 DIAGNOSIS — E785 Hyperlipidemia, unspecified: Secondary | ICD-10-CM

## 2017-10-23 DIAGNOSIS — I4891 Unspecified atrial fibrillation: Secondary | ICD-10-CM | POA: Diagnosis not present

## 2017-10-23 DIAGNOSIS — E876 Hypokalemia: Secondary | ICD-10-CM | POA: Diagnosis present

## 2017-10-23 DIAGNOSIS — E039 Hypothyroidism, unspecified: Secondary | ICD-10-CM | POA: Diagnosis present

## 2017-10-23 DIAGNOSIS — J9601 Acute respiratory failure with hypoxia: Secondary | ICD-10-CM | POA: Diagnosis present

## 2017-10-23 DIAGNOSIS — I739 Peripheral vascular disease, unspecified: Secondary | ICD-10-CM | POA: Diagnosis present

## 2017-10-23 DIAGNOSIS — A419 Sepsis, unspecified organism: Principal | ICD-10-CM | POA: Insufficient documentation

## 2017-10-23 DIAGNOSIS — J9811 Atelectasis: Secondary | ICD-10-CM | POA: Diagnosis not present

## 2017-10-23 DIAGNOSIS — G9341 Metabolic encephalopathy: Secondary | ICD-10-CM | POA: Diagnosis present

## 2017-10-23 DIAGNOSIS — I480 Paroxysmal atrial fibrillation: Secondary | ICD-10-CM | POA: Diagnosis not present

## 2017-10-23 DIAGNOSIS — Z95810 Presence of automatic (implantable) cardiac defibrillator: Secondary | ICD-10-CM | POA: Diagnosis not present

## 2017-10-23 DIAGNOSIS — I252 Old myocardial infarction: Secondary | ICD-10-CM | POA: Diagnosis not present

## 2017-10-23 DIAGNOSIS — N183 Chronic kidney disease, stage 3 unspecified: Secondary | ICD-10-CM | POA: Diagnosis present

## 2017-10-23 DIAGNOSIS — Z4659 Encounter for fitting and adjustment of other gastrointestinal appliance and device: Secondary | ICD-10-CM

## 2017-10-23 DIAGNOSIS — E1152 Type 2 diabetes mellitus with diabetic peripheral angiopathy with gangrene: Secondary | ICD-10-CM | POA: Diagnosis present

## 2017-10-23 DIAGNOSIS — K219 Gastro-esophageal reflux disease without esophagitis: Secondary | ICD-10-CM | POA: Diagnosis present

## 2017-10-23 DIAGNOSIS — I5043 Acute on chronic combined systolic (congestive) and diastolic (congestive) heart failure: Secondary | ICD-10-CM | POA: Diagnosis not present

## 2017-10-23 DIAGNOSIS — I110 Hypertensive heart disease with heart failure: Secondary | ICD-10-CM | POA: Diagnosis not present

## 2017-10-23 DIAGNOSIS — E11621 Type 2 diabetes mellitus with foot ulcer: Secondary | ICD-10-CM | POA: Diagnosis not present

## 2017-10-23 DIAGNOSIS — M0579 Rheumatoid arthritis with rheumatoid factor of multiple sites without organ or systems involvement: Secondary | ICD-10-CM | POA: Diagnosis present

## 2017-10-23 DIAGNOSIS — R338 Other retention of urine: Secondary | ICD-10-CM | POA: Diagnosis not present

## 2017-10-23 DIAGNOSIS — N179 Acute kidney failure, unspecified: Secondary | ICD-10-CM | POA: Diagnosis not present

## 2017-10-23 DIAGNOSIS — E872 Acidosis: Secondary | ICD-10-CM | POA: Diagnosis present

## 2017-10-23 DIAGNOSIS — Z885 Allergy status to narcotic agent status: Secondary | ICD-10-CM | POA: Diagnosis not present

## 2017-10-23 DIAGNOSIS — Z8673 Personal history of transient ischemic attack (TIA), and cerebral infarction without residual deficits: Secondary | ICD-10-CM

## 2017-10-23 DIAGNOSIS — Z8042 Family history of malignant neoplasm of prostate: Secondary | ICD-10-CM

## 2017-10-23 DIAGNOSIS — E11628 Type 2 diabetes mellitus with other skin complications: Secondary | ICD-10-CM | POA: Diagnosis not present

## 2017-10-23 DIAGNOSIS — N5089 Other specified disorders of the male genital organs: Secondary | ICD-10-CM

## 2017-10-23 DIAGNOSIS — G459 Transient cerebral ischemic attack, unspecified: Secondary | ICD-10-CM

## 2017-10-23 DIAGNOSIS — R0602 Shortness of breath: Secondary | ICD-10-CM | POA: Diagnosis not present

## 2017-10-23 DIAGNOSIS — R6521 Severe sepsis with septic shock: Secondary | ICD-10-CM | POA: Diagnosis not present

## 2017-10-23 DIAGNOSIS — Z96659 Presence of unspecified artificial knee joint: Secondary | ICD-10-CM | POA: Diagnosis present

## 2017-10-23 DIAGNOSIS — L97529 Non-pressure chronic ulcer of other part of left foot with unspecified severity: Secondary | ICD-10-CM | POA: Diagnosis present

## 2017-10-23 DIAGNOSIS — L97521 Non-pressure chronic ulcer of other part of left foot limited to breakdown of skin: Secondary | ICD-10-CM | POA: Diagnosis not present

## 2017-10-23 DIAGNOSIS — Z87891 Personal history of nicotine dependence: Secondary | ICD-10-CM | POA: Diagnosis not present

## 2017-10-23 DIAGNOSIS — Z955 Presence of coronary angioplasty implant and graft: Secondary | ICD-10-CM

## 2017-10-23 DIAGNOSIS — I5023 Acute on chronic systolic (congestive) heart failure: Secondary | ICD-10-CM | POA: Diagnosis present

## 2017-10-23 DIAGNOSIS — I481 Persistent atrial fibrillation: Secondary | ICD-10-CM | POA: Diagnosis not present

## 2017-10-23 DIAGNOSIS — I5032 Chronic diastolic (congestive) heart failure: Secondary | ICD-10-CM | POA: Diagnosis not present

## 2017-10-23 DIAGNOSIS — I447 Left bundle-branch block, unspecified: Secondary | ICD-10-CM | POA: Diagnosis present

## 2017-10-23 DIAGNOSIS — Z6838 Body mass index (BMI) 38.0-38.9, adult: Secondary | ICD-10-CM

## 2017-10-23 DIAGNOSIS — Z9289 Personal history of other medical treatment: Secondary | ICD-10-CM | POA: Diagnosis not present

## 2017-10-23 DIAGNOSIS — J9600 Acute respiratory failure, unspecified whether with hypoxia or hypercapnia: Secondary | ICD-10-CM | POA: Diagnosis not present

## 2017-10-23 DIAGNOSIS — E1151 Type 2 diabetes mellitus with diabetic peripheral angiopathy without gangrene: Secondary | ICD-10-CM | POA: Diagnosis not present

## 2017-10-23 DIAGNOSIS — E871 Hypo-osmolality and hyponatremia: Secondary | ICD-10-CM | POA: Diagnosis not present

## 2017-10-23 DIAGNOSIS — R948 Abnormal results of function studies of other organs and systems: Secondary | ICD-10-CM | POA: Diagnosis not present

## 2017-10-23 DIAGNOSIS — T8149XA Infection following a procedure, other surgical site, initial encounter: Secondary | ICD-10-CM | POA: Diagnosis not present

## 2017-10-23 DIAGNOSIS — L97528 Non-pressure chronic ulcer of other part of left foot with other specified severity: Secondary | ICD-10-CM | POA: Diagnosis not present

## 2017-10-23 DIAGNOSIS — J9690 Respiratory failure, unspecified, unspecified whether with hypoxia or hypercapnia: Secondary | ICD-10-CM

## 2017-10-23 DIAGNOSIS — L03116 Cellulitis of left lower limb: Secondary | ICD-10-CM | POA: Diagnosis not present

## 2017-10-23 DIAGNOSIS — N432 Other hydrocele: Secondary | ICD-10-CM | POA: Diagnosis present

## 2017-10-23 DIAGNOSIS — Z800 Family history of malignant neoplasm of digestive organs: Secondary | ICD-10-CM

## 2017-10-23 DIAGNOSIS — J90 Pleural effusion, not elsewhere classified: Secondary | ICD-10-CM | POA: Diagnosis not present

## 2017-10-23 DIAGNOSIS — Z8619 Personal history of other infectious and parasitic diseases: Secondary | ICD-10-CM | POA: Diagnosis not present

## 2017-10-23 DIAGNOSIS — L97429 Non-pressure chronic ulcer of left heel and midfoot with unspecified severity: Secondary | ICD-10-CM | POA: Diagnosis not present

## 2017-10-23 DIAGNOSIS — I96 Gangrene, not elsewhere classified: Secondary | ICD-10-CM | POA: Diagnosis not present

## 2017-10-23 DIAGNOSIS — R339 Retention of urine, unspecified: Secondary | ICD-10-CM | POA: Diagnosis present

## 2017-10-23 DIAGNOSIS — I861 Scrotal varices: Secondary | ICD-10-CM | POA: Diagnosis present

## 2017-10-23 DIAGNOSIS — I130 Hypertensive heart and chronic kidney disease with heart failure and stage 1 through stage 4 chronic kidney disease, or unspecified chronic kidney disease: Secondary | ICD-10-CM | POA: Diagnosis present

## 2017-10-23 DIAGNOSIS — Z7902 Long term (current) use of antithrombotics/antiplatelets: Secondary | ICD-10-CM

## 2017-10-23 DIAGNOSIS — Z8249 Family history of ischemic heart disease and other diseases of the circulatory system: Secondary | ICD-10-CM | POA: Diagnosis not present

## 2017-10-23 DIAGNOSIS — I255 Ischemic cardiomyopathy: Secondary | ICD-10-CM | POA: Diagnosis present

## 2017-10-23 DIAGNOSIS — E119 Type 2 diabetes mellitus without complications: Secondary | ICD-10-CM | POA: Diagnosis not present

## 2017-10-23 DIAGNOSIS — Z794 Long term (current) use of insulin: Secondary | ICD-10-CM

## 2017-10-23 DIAGNOSIS — Z833 Family history of diabetes mellitus: Secondary | ICD-10-CM | POA: Diagnosis not present

## 2017-10-23 DIAGNOSIS — Z95820 Peripheral vascular angioplasty status with implants and grafts: Secondary | ICD-10-CM | POA: Diagnosis not present

## 2017-10-23 DIAGNOSIS — I2510 Atherosclerotic heart disease of native coronary artery without angina pectoris: Secondary | ICD-10-CM | POA: Diagnosis not present

## 2017-10-23 DIAGNOSIS — I5022 Chronic systolic (congestive) heart failure: Secondary | ICD-10-CM

## 2017-10-23 DIAGNOSIS — I700 Atherosclerosis of aorta: Secondary | ICD-10-CM | POA: Diagnosis present

## 2017-10-23 DIAGNOSIS — N433 Hydrocele, unspecified: Secondary | ICD-10-CM | POA: Diagnosis not present

## 2017-10-23 DIAGNOSIS — M7989 Other specified soft tissue disorders: Secondary | ICD-10-CM | POA: Diagnosis not present

## 2017-10-23 DIAGNOSIS — E1122 Type 2 diabetes mellitus with diabetic chronic kidney disease: Secondary | ICD-10-CM | POA: Diagnosis present

## 2017-10-23 DIAGNOSIS — I21A1 Myocardial infarction type 2: Secondary | ICD-10-CM | POA: Diagnosis not present

## 2017-10-23 DIAGNOSIS — Z4682 Encounter for fitting and adjustment of non-vascular catheter: Secondary | ICD-10-CM | POA: Diagnosis not present

## 2017-10-23 DIAGNOSIS — E1165 Type 2 diabetes mellitus with hyperglycemia: Secondary | ICD-10-CM | POA: Diagnosis present

## 2017-10-23 DIAGNOSIS — N3000 Acute cystitis without hematuria: Secondary | ICD-10-CM | POA: Diagnosis not present

## 2017-10-23 DIAGNOSIS — Z888 Allergy status to other drugs, medicaments and biological substances status: Secondary | ICD-10-CM

## 2017-10-23 DIAGNOSIS — Z7901 Long term (current) use of anticoagulants: Secondary | ICD-10-CM

## 2017-10-23 DIAGNOSIS — J189 Pneumonia, unspecified organism: Secondary | ICD-10-CM | POA: Diagnosis not present

## 2017-10-23 DIAGNOSIS — L089 Local infection of the skin and subcutaneous tissue, unspecified: Secondary | ICD-10-CM | POA: Diagnosis not present

## 2017-10-23 DIAGNOSIS — M79672 Pain in left foot: Secondary | ICD-10-CM | POA: Diagnosis not present

## 2017-10-23 DIAGNOSIS — Z79899 Other long term (current) drug therapy: Secondary | ICD-10-CM

## 2017-10-23 HISTORY — DX: Cystitis, unspecified without hematuria: N30.90

## 2017-10-23 LAB — POCT I-STAT 3, ART BLOOD GAS (G3+)
ACID-BASE DEFICIT: 4 mmol/L — AB (ref 0.0–2.0)
Bicarbonate: 21.3 mmol/L (ref 20.0–28.0)
O2 SAT: 100 %
TCO2: 22 mmol/L (ref 22–32)
pCO2 arterial: 37.2 mmHg (ref 32.0–48.0)
pH, Arterial: 7.364 (ref 7.350–7.450)
pO2, Arterial: 543 mmHg — ABNORMAL HIGH (ref 83.0–108.0)

## 2017-10-23 LAB — GLUCOSE, CAPILLARY
GLUCOSE-CAPILLARY: 248 mg/dL — AB (ref 65–99)
GLUCOSE-CAPILLARY: 199 mg/dL — AB (ref 65–99)
GLUCOSE-CAPILLARY: 186 mg/dL — AB (ref 65–99)
GLUCOSE-CAPILLARY: 153 mg/dL — AB (ref 65–99)
GLUCOSE-CAPILLARY: 149 mg/dL — AB (ref 65–99)
Glucose-Capillary: 287 mg/dL — ABNORMAL HIGH (ref 65–99)
Glucose-Capillary: 256 mg/dL — ABNORMAL HIGH (ref 65–99)
Glucose-Capillary: 248 mg/dL — ABNORMAL HIGH (ref 65–99)
Glucose-Capillary: 205 mg/dL — ABNORMAL HIGH (ref 65–99)
Glucose-Capillary: 189 mg/dL — ABNORMAL HIGH (ref 65–99)
Glucose-Capillary: 210 mg/dL — ABNORMAL HIGH (ref 65–99)
Glucose-Capillary: 174 mg/dL — ABNORMAL HIGH (ref 65–99)
Glucose-Capillary: 145 mg/dL — ABNORMAL HIGH (ref 65–99)

## 2017-10-23 LAB — HEPATIC FUNCTION PANEL
ALK PHOS: 213 U/L — AB (ref 38–126)
ALT: 543 U/L — AB (ref 17–63)
AST: 1352 U/L — ABNORMAL HIGH (ref 15–41)
Albumin: 2.2 g/dL — ABNORMAL LOW (ref 3.5–5.0)
BILIRUBIN DIRECT: 0.5 mg/dL (ref 0.1–0.5)
BILIRUBIN INDIRECT: 0.5 mg/dL (ref 0.3–0.9)
Total Bilirubin: 1 mg/dL (ref 0.3–1.2)
Total Protein: 6.3 g/dL — ABNORMAL LOW (ref 6.5–8.1)

## 2017-10-23 LAB — BASIC METABOLIC PANEL
ANION GAP: 16 — AB (ref 5–15)
BUN: 64 mg/dL — ABNORMAL HIGH (ref 6–20)
CALCIUM: 8.2 mg/dL — AB (ref 8.9–10.3)
CO2: 19 mmol/L — AB (ref 22–32)
CREATININE: 3.28 mg/dL — AB (ref 0.61–1.24)
Chloride: 100 mmol/L — ABNORMAL LOW (ref 101–111)
GFR, EST AFRICAN AMERICAN: 19 mL/min — AB (ref >60)
GFR, EST NON AFRICAN AMERICAN: 17 mL/min — AB (ref >60)
GLUCOSE: 309 mg/dL — AB (ref 65–99)
Potassium: 4 mmol/L (ref 3.5–5.1)
Sodium: 135 mmol/L (ref 135–145)

## 2017-10-23 LAB — LACTIC ACID, PLASMA
Lactic Acid, Venous: 2 mmol/L (ref 0.5–1.9)
Lactic Acid, Venous: 1.7 mmol/L (ref 0.5–1.9)

## 2017-10-23 LAB — CBC
HCT: 27.2 % — ABNORMAL LOW (ref 39.0–52.0)
HEMOGLOBIN: 8.6 g/dL — AB (ref 13.0–17.0)
MCH: 30.5 pg (ref 26.0–34.0)
MCHC: 31.6 g/dL (ref 30.0–36.0)
MCV: 96.5 fL (ref 78.0–100.0)
PLATELETS: 339 10*3/uL (ref 150–400)
RBC: 2.82 MIL/uL — AB (ref 4.22–5.81)
RDW: 17 % — ABNORMAL HIGH (ref 11.5–15.5)
WBC: 14.3 10*3/uL — ABNORMAL HIGH (ref 4.0–10.5)

## 2017-10-23 LAB — MRSA PCR SCREENING: MRSA by PCR: NEGATIVE

## 2017-10-23 LAB — TROPONIN I
TROPONIN I: 2.81 ng/mL — AB (ref <0.03)
Troponin I: 3.42 ng/mL (ref <0.03)

## 2017-10-23 LAB — PROCALCITONIN: Procalcitonin: 0.31 ng/mL

## 2017-10-23 MED ORDER — DIGOXIN 0.25 MG/ML IJ SOLN
0.2500 mg | Freq: Once | INTRAMUSCULAR | Status: AC
  Administered 2017-10-24: 0.25 mg via INTRAVENOUS
  Filled 2017-10-23: qty 1

## 2017-10-23 MED ORDER — PANTOPRAZOLE SODIUM 40 MG IV SOLR
40.0000 mg | Freq: Every day | INTRAVENOUS | Status: DC
  Administered 2017-10-23 – 2017-10-25 (×3): 40 mg via INTRAVENOUS
  Filled 2017-10-23 (×3): qty 40

## 2017-10-23 MED ORDER — VITAL HIGH PROTEIN PO LIQD
1000.0000 mL | ORAL | Status: DC
  Administered 2017-10-23 – 2017-10-24 (×2): 1000 mL

## 2017-10-23 MED ORDER — SODIUM CHLORIDE 0.9 % IV SOLN
INTRAVENOUS | Status: DC
  Administered 2017-10-23: 7.1 [IU]/h via INTRAVENOUS
  Administered 2017-10-23: 10:00:00 via INTRAVENOUS
  Filled 2017-10-23 (×2): qty 1

## 2017-10-23 MED ORDER — SODIUM CHLORIDE 0.9 % IV BOLUS (SEPSIS)
500.0000 mL | Freq: Once | INTRAVENOUS | Status: AC
  Administered 2017-10-23: 500 mL via INTRAVENOUS

## 2017-10-23 MED ORDER — PRO-STAT SUGAR FREE PO LIQD
30.0000 mL | Freq: Every day | ORAL | Status: DC
  Administered 2017-10-23 – 2017-10-24 (×2): 30 mL
  Filled 2017-10-23 (×2): qty 30

## 2017-10-23 MED ORDER — SODIUM CHLORIDE 0.9 % IV SOLN
1250.0000 mg | INTRAVENOUS | Status: DC
  Administered 2017-10-24 – 2017-10-26 (×2): 1250 mg via INTRAVENOUS
  Filled 2017-10-23 (×2): qty 1250

## 2017-10-23 MED ORDER — CHLORHEXIDINE GLUCONATE 0.12% ORAL RINSE (MEDLINE KIT)
15.0000 mL | Freq: Two times a day (BID) | OROMUCOSAL | Status: DC
  Administered 2017-10-23 – 2017-10-24 (×4): 15 mL via OROMUCOSAL

## 2017-10-23 MED ORDER — COLLAGENASE 250 UNIT/GM EX OINT
TOPICAL_OINTMENT | Freq: Every day | CUTANEOUS | Status: DC
  Administered 2017-10-24 – 2017-11-01 (×8): via TOPICAL
  Administered 2017-11-02: 1 via TOPICAL
  Administered 2017-11-03 – 2017-11-06 (×4): via TOPICAL
  Filled 2017-10-23: qty 30

## 2017-10-23 MED ORDER — DIGOXIN 0.25 MG/ML IJ SOLN: 0.2500 mg | Freq: Every day | INTRAMUSCULAR | Status: DC

## 2017-10-23 MED ORDER — HEPARIN SODIUM (PORCINE) 5000 UNIT/ML IJ SOLN
5000.0000 [IU] | Freq: Three times a day (TID) | INTRAMUSCULAR | Status: DC
  Administered 2017-10-23 – 2017-10-31 (×25): 5000 [IU] via SUBCUTANEOUS
  Filled 2017-10-23 (×25): qty 1

## 2017-10-23 MED ORDER — SODIUM CHLORIDE 0.9% FLUSH
10.0000 mL | INTRAVENOUS | Status: DC | PRN
  Administered 2017-10-24: 20 mL
  Filled 2017-10-23: qty 40

## 2017-10-23 MED ORDER — SODIUM CHLORIDE 0.9 % IV SOLN
INTRAVENOUS | Status: DC
  Administered 2017-10-23 – 2017-10-24 (×2): via INTRAVENOUS

## 2017-10-23 MED ORDER — NOREPINEPHRINE BITARTRATE 1 MG/ML IV SOLN
0.0000 ug/min | INTRAVENOUS | Status: DC
  Administered 2017-10-23: 6 ug/min via INTRAVENOUS
  Administered 2017-10-23: 17 ug/min via INTRAVENOUS
  Filled 2017-10-23 (×3): qty 4

## 2017-10-23 MED ORDER — CHLORHEXIDINE GLUCONATE CLOTH 2 % EX PADS
6.0000 | MEDICATED_PAD | Freq: Every day | CUTANEOUS | Status: DC
  Administered 2017-10-24 (×2): 6 via TOPICAL

## 2017-10-23 MED ORDER — CLOPIDOGREL BISULFATE 75 MG PO TABS: 75.0000 mg | ORAL_TABLET | Freq: Every day | ORAL | Status: DC

## 2017-10-23 MED ORDER — FENTANYL CITRATE (PF) 100 MCG/2ML IJ SOLN
50.0000 ug | INTRAMUSCULAR | Status: DC | PRN
  Administered 2017-10-23: 50 ug via INTRAVENOUS
  Filled 2017-10-23 (×2): qty 2

## 2017-10-23 MED ORDER — SODIUM CHLORIDE 0.9 % IV SOLN: 250.0000 mL | INTRAVENOUS | Status: DC | PRN

## 2017-10-23 MED ORDER — DIGOXIN 0.25 MG/ML IJ SOLN
0.2500 mg | Freq: Once | INTRAMUSCULAR | Status: AC
  Administered 2017-10-23: 0.25 mg via INTRAVENOUS
  Filled 2017-10-23: qty 2

## 2017-10-23 MED ORDER — INSULIN GLARGINE 100 UNIT/ML ~~LOC~~ SOLN
5.0000 [IU] | Freq: Two times a day (BID) | SUBCUTANEOUS | Status: DC
  Administered 2017-10-23 – 2017-11-06 (×30): 5 [IU] via SUBCUTANEOUS
  Filled 2017-10-23 (×29): qty 0.05

## 2017-10-23 MED ORDER — LEVOTHYROXINE SODIUM 100 MCG IV SOLR
25.0000 ug | Freq: Every day | INTRAVENOUS | Status: DC
  Administered 2017-10-23 – 2017-10-25 (×3): 25 ug via INTRAVENOUS
  Filled 2017-10-23 (×3): qty 5

## 2017-10-23 MED ORDER — FENTANYL CITRATE (PF) 100 MCG/2ML IJ SOLN
50.0000 ug | INTRAMUSCULAR | Status: DC | PRN
  Administered 2017-10-23: 50 ug via INTRAVENOUS
  Filled 2017-10-23: qty 2

## 2017-10-23 MED ORDER — INSULIN ASPART 100 UNIT/ML ~~LOC~~ SOLN
2.0000 [IU] | SUBCUTANEOUS | Status: DC
  Administered 2017-10-23: 6 [IU] via SUBCUTANEOUS

## 2017-10-23 MED ORDER — SODIUM CHLORIDE 0.9% FLUSH
10.0000 mL | Freq: Two times a day (BID) | INTRAVENOUS | Status: DC
  Administered 2017-10-23 – 2017-10-25 (×4): 10 mL

## 2017-10-23 MED ORDER — MIDAZOLAM HCL 2 MG/2ML IJ SOLN
1.0000 mg | INTRAMUSCULAR | Status: DC | PRN
  Administered 2017-10-23: 1 mg via INTRAVENOUS
  Filled 2017-10-23 (×2): qty 2

## 2017-10-23 MED ORDER — CLOPIDOGREL BISULFATE 75 MG PO TABS
75.0000 mg | ORAL_TABLET | Freq: Every day | ORAL | Status: DC
  Administered 2017-10-23 – 2017-10-25 (×3): 75 mg
  Filled 2017-10-23 (×3): qty 1

## 2017-10-23 MED ORDER — MIDAZOLAM HCL 2 MG/2ML IJ SOLN
1.0000 mg | INTRAMUSCULAR | Status: DC | PRN
  Administered 2017-10-23 (×2): 1 mg via INTRAVENOUS
  Filled 2017-10-23 (×3): qty 2

## 2017-10-23 MED ORDER — PIPERACILLIN-TAZOBACTAM 3.375 G IVPB
3.3750 g | Freq: Three times a day (TID) | INTRAVENOUS | Status: DC
  Administered 2017-10-23 – 2017-10-26 (×10): 3.375 g via INTRAVENOUS
  Filled 2017-10-23 (×11): qty 50

## 2017-10-23 MED ORDER — ACETAMINOPHEN 160 MG/5ML PO SOLN
650.0000 mg | Freq: Four times a day (QID) | ORAL | Status: DC | PRN
  Administered 2017-10-23: 650 mg via ORAL
  Filled 2017-10-23: qty 20.3

## 2017-10-23 MED ORDER — ORAL CARE MOUTH RINSE
15.0000 mL | OROMUCOSAL | Status: DC
  Administered 2017-10-23 – 2017-10-24 (×15): 15 mL via OROMUCOSAL

## 2017-10-23 NOTE — Progress Notes (Signed)
Pharmacy Antibiotic Note  [name] is a 76 y.o. male admitted on 10/23/2017 with AMS/sepsis/LLE cellulitis.  Admitted 11/12-11/30 at [facility] for LLE wound infection.  Pharmacy has been consulted for Vancomycin and Zosyn   Dosing.  Vancomycin 2 g IV given at  [facility] at 1630 12/7    Plan: Vancomycin 1250 mg IV q48h Zosyn 3.375 g IV q8h   Labs (at [facility]) WBC 10.6 Hgb  8.9 Hct 27.2 Plt 350  SCr 2.88     No data recorded.  No results for input(s): WBC, CREATININE, LATICACIDVEN, VANCOTROUGH, VANCOPEAK, VANCORANDOM, GENTTROUGH, GENTPEAK, GENTRANDOM, TOBRATROUGH, TOBRAPEAK, TOBRARND, AMIKACINPEAK, AMIKACINTROU, AMIKACIN in the last 168 hours.  Estimated Creatinine Clearance: 32.3 mL/min (A) (by C-G formula based on SCr of 2.4 mg/dL (H)).    Allergies  Allergen Reactions  . Allegra [Fexofenadine]   . Biaxin [Clarithromycin]   . Crestor [Rosuvastatin Calcium]   . Glucophage [Metformin Hcl]   . Trimethoprim   . Codeine Other (See Comments)    blisters, but able to take hydrocodone    [name] 10/23/2017 6:01 AM

## 2017-10-23 NOTE — Progress Notes (Signed)
Initial Nutrition Assessment  DOCUMENTATION CODES:  Obesity unspecified  INTERVENTION:  Initiate TF via OGT with Vital High Protein at goal rate of 60 ml/h (1440 ml per day) and Prostat 30 ml Q24 hrs to provide 1540 kcals, 141 gm protein, 1204 ml free water daily.  NUTRITION DIAGNOSIS:  Inadequate oral intake related to inability to eat as evidenced by NPO status.  GOAL:  Patient will meet greater than or equal to 90% of their needs  MONITOR:  PO intake, Supplement acceptance, Labs, Weight trends, Vent status, I & O's, TF tolerance  REASON FOR ASSESSMENT:  Consult Enteral/tube feeding initiation and management  ASSESSMENT:  76 y/o male PMHx DM, CAD, PAF, CKD3, RA, Ischemic cardiomyopathy s/p ICD, HLD/HTN. Admitted for 11/12-11/30 for L foot cellulitis, c/b GIB. S/p L SFA/popliteral atherectomy 11/27. Presented from SNF w/ AMS and confusion. Worked up for elevated LA, abnormal UA, hypotension. Suspected Urosepsis. Intubated for airway protection in setting of metabolic encephalopathy. RD consulted for TF.   Pt intubated. Will nod appropriately to some questions. His wife is at bedside. She reports that PTA the patient "had just gotten to the point where he was able to eat again" after he had been extubated. She notes he had a sore throat from being intubated x5 days (extubated 11/25).   She says the pt's wt varies with fluid, but it "stays around" 250 lbs. Bed wt taken today as 255 lbs. His wt appears to be relatively stable.   Patient is currently intubated on ventilator support MV: 8.7 L/min Temp (24hrs), Avg:98.3 F (36.8 C), Min:98 F (36.7 C), Max:98.6 F (37 C) Propofol: None  Physical Exam: WDL  Labs: 250-310 BGs, Elevated LFTs, Albumin:2.2, Bun/Creat:64/3.28, MAP 68 Meds: PPI, Insulin, IVF, IV abx, PRN Versed/fent  NUTRITION - FOCUSED PHYSICAL EXAM: WDL, Obese  Diet Order:  Diet NPO time specified  EDUCATION NEEDS:  No education needs have been identified at this  time  Skin: Cellulitis on L leg, MASD to buttocks, scrotum, thigh, perineum, Diabetic Ulcer to L food, PU stage II to sacrum,   Last BM:  Unknown- At most, wife reports she is certain he had one Thurs  Height:  Ht Readings from Last 1 Encounters:  10/11/17 5\' 8"  (1.727 m)   Weight:  Wt Readings from Last 1 Encounters:  10/23/17 255 lb 4.7 oz (115.8 kg)   Wt Readings from Last 10 Encounters:  10/23/17 255 lb 4.7 oz (115.8 kg)  10/11/17 262 lb 5.6 oz (119 kg)  08/04/17 253 lb (114.8 kg)  06/30/17 255 lb (115.7 kg)  06/11/17 253 lb (114.8 kg)  06/07/17 253 lb (114.8 kg)  03/30/17 256 lb (116.1 kg)  03/18/17 250 lb (113.4 kg)  03/02/17 256 lb (116.1 kg)  02/26/17 251 lb (113.9 kg)   Ideal Body Weight:  70 kg  BMI:  Body mass index is 38.82 kg/m.  Estimated Nutritional Needs:  Kcal:  1275-1620 kcals (11-14 kcal/kg bw) Protein:  >140g Pro (2g/kg ibw) Fluid:  Per MD  02/28/17 RD, LDN, CNSC Clinical Nutrition Pager: Christophe Louis 10/23/2017 11:10 AM

## 2017-10-23 NOTE — Procedures (Signed)
Arterial Catheter Insertion Procedure Note [name] [id number] 04/05/1941  Procedure: Insertion of Arterial Catheter  Indications: Blood pressure monitoring  Procedure Details Consent: Risks of procedure as well as the alternatives and risks of each were explained to the (patient/caregiver).  Consent for procedure obtained. Time Out: Verified patient identification, verified procedure, site/side was marked, verified correct patient position, special equipment/implants available, medications/allergies/relevent history reviewed, required imaging and test results available.  Performed  Maximum sterile technique was used including antiseptics. Skin prep: Chlorhexidine; local anesthetic administered 20 gauge catheter was inserted into left radial artery using the Seldinger technique.  Evaluation Blood flow good; BP tracing good. Complications: No apparent complications.   Pt tolerated well.  RT will continue to monitor.  Ronny Flurry 10/23/2017

## 2017-10-23 NOTE — Progress Notes (Addendum)
CRITICAL VALUE ALERT  Critical Value:  Troponin 2.81  Date & Time Notied: 10/23/17 7:36 PM  Provider Notified: elink  Orders Received/Actions taken: cycle troponins q6 x3 per MD order

## 2017-10-23 NOTE — Progress Notes (Signed)
PULMONARY / CRITICAL CARE MEDICINE   Name: [name] MRN: [id number] DOB: August 23, 1941     ADMISSION DATE:  10/23/2017 CONSULTATION DATE:  10/23/2017  REFERRING MD:  [facility]    CHIEF COMPLAINT:  AMS  HISTORY OF PRESENT ILLNESS:   76 year old male with PMH of DM, CAD on plavix, PAF on coumadin, CKD stage 3, RA, ischemic cardiomyopathy s/p ICD  Recent Admission 11/12-11/30. Cellulitis to left foot. Stay complicated by GI bleed. Underwent L SFA/popliteral atherectomy on 10/12/2017.   Presented to OSH on 12/7 with confusion and AMS. Wife reports that since discharge to SNF patient has not been acting like self. However 12/7 she went to nursing home to feed patient lunch and noticed that he was more altered/lethargic then usual. CT Head negative. LA 1.8. U/A with few bacteria +yeast. Given Vancomycin and Levofloxacin. Intubated for airway protection. Transferred to [facility] for further workup.   SUBJECTIVE:  No events overnight  VITAL SIGNS: BP 103/83 (BP Location: Right Leg)   Pulse (!) 133   Temp 98.1 F (36.7 C) (Oral)   Resp 20   SpO2 100%   HEMODYNAMICS:    VENTILATOR SETTINGS: Vent Mode: PRVC FiO2 (%):  [40 %-100 %] 40 % Set Rate:  [16 bmp] 16 bmp Vt Set:  [550 mL-2550 mL] 2550 mL PEEP:  [5 cmH20] 5 cmH20 Plateau Pressure:  [16 cmH20-20 cmH20] 20 cmH20  INTAKE / OUTPUT: I/O last 3 completed shifts: In: 62.7 [I.V.:12.7; IV Piggyback:50] Out: 25 [Urine:25]  PHYSICAL EXAMINATION: General:  Acutely ill appearing male, NAD Neuro:  Lethargic, opens eyes, does not follow commands, withdrawals to pain  HEENT:  ETT in place  Cardiovascular:  Paced, Tachy, no MRG  Lungs:  Diminished to bases, no wheeze/crackles  Abdomen:  Distended, active bowel sounds  Musculoskeletal:  +3 BLE Edema  Skin: left foot black diabetic ulcer     LABS:  BMET Recent Labs  Lab 10/23/17 0523  NA 135  K 4.0  CL 100*  CO2 19*  BUN 64*  CREATININE 3.28*  GLUCOSE 309*    Electrolytes Recent Labs  Lab 10/23/17 0523  CALCIUM 8.2*   CBC Recent Labs  Lab 10/23/17 0523  WBC 14.3*  HGB 8.6*  HCT 27.2*  PLT 339   Coag's No results for input(s): APTT, INR in the last 168 hours.  Sepsis Markers Recent Labs  Lab 10/23/17 0523  LATICACIDVEN 2.0*   ABG Recent Labs  Lab 10/23/17 0611  PHART 7.364  PCO2ART 37.2  PO2ART 543.0*   Liver Enzymes Recent Labs  Lab 10/23/17 0523  AST 1,352*  ALT 543*  ALKPHOS 213*  BILITOT 1.0  ALBUMIN 2.2*   Cardiac Enzymes Recent Labs  Lab 10/23/17 0523  TROPONINI 0.42*   Glucose Recent Labs  Lab 10/23/17 0621 10/23/17 0759  GLUCAP 248* 287*   Imaging Dg Chest Port 1 View  Result Date: 10/23/2017 CLINICAL DATA:  76 year old male status post intubation. EXAM: PORTABLE CHEST 1 VIEW COMPARISON:  Earlier chest radiograph dated 10/23/2017 FINDINGS: Endotracheal tube with tip above the carina in similar positioning. Right IJ central venous catheter as seen previously. Left lung base densities may represent atelectatic changes. Pneumonia is not excluded. Overall there has been interval increase in the densities at the left lung base compared to the earlier radiograph. A small left pleural effusion is not entirely excluded. There is no pneumothorax. Stable cardiomegaly. Left pectoral AICD device. No acute osseous pathology. IMPRESSION: 1. Endotracheal tube and right IJ central  line in similar position. 2. Interval increase in the left lung base densities which may represent atelectatic changes, although infiltrate is not excluded. A small left pleural effusion may be present. Electronically Signed   By: Elgie Collard M.D.   On: 10/23/2017 06:53   Dg Abd Portable 1v  Result Date: 10/23/2017 CLINICAL DATA:  76 year old male with an orogastric tube in place. Assess tube location. EXAM: PORTABLE ABDOMEN - 1 VIEW COMPARISON:  Prior abdominal radiograph 10/06/2017 FINDINGS: A single radiograph of the upper abdomen  is submitted for review. The orogastric tube is in good position projecting over the gastric body. Incompletely imaged biventricular cardiac rhythm maintenance device. The tip of a venous catheter overlies the mid right atrium. Mild patchy airspace opacity in the left lower lobe may reflect infiltrate or atelectasis. The visualized bowel gas pattern is not obstructed. IMPRESSION: 1. Well-positioned gastric tube projects over the gastric body. 2. Left basilar patchy airspace opacity may reflect atelectasis or infiltrate. Electronically Signed   By: Malachy Moan M.D.   On: 10/23/2017 08:35   STUDIES:  CT Head 12/7 > no acute, moderate to severe parenchymal brain volume loss and moderate small vessel ischemic disease  CXR 12/7 > Cardiomegaly and mild interstitial edema, left basilar opacity likely due to atelectasis     CULTURES: Blood 12/8 >> Urine 12/8 >> Sputum 12/8 >>   ANTIBIOTICS: Vancomycin 12/7>>> Levofloxacin 12/7>>> Zosyn 12/8 >> >  SIGNIFICANT EVENTS: 12/7 > Presents to OSH   LINES/TUBES: ETT 12/7 >>  R IJ TLC 12/7>>>  DISCUSSION: 76 year old male presents to ED with AMS from SNF. Intubated for airway protection. Transferred to [facility] for further management.   ASSESSMENT / PLAN:  PULMONARY A: Respiratory Insufficieny in setting of metabolic encephalopathy  P:   Full vent Support Wean as tolerated pending mental status ABG/CXR now  Pulmonary Hygiene   CARDIOVASCULAR A:  Septic Shock  H/O PAF, CAD, Ischemic Cardiomyopathy (EF 20-25) s/p ICD  P:  Cardiac Monitoring  Coumadin as been held outpatient due to recent GI bleed > Plans to restart in few weeks  Wean Levophed to maintain MAP >65  Hold Coreg and Demadex in setting of hypotension Continue Plavix  Trend CVP   RENAL A:   Anion Gap Metabolic Acidosis  Acute on Chronic kidney disease  CKD stage 3 (Baseline crt 1.9-2) Urinary Rentention P:   Trend BMP  Avoid Nephrotoxic Medications  Replace  electrolytes as indicated   GASTROINTESTINAL A:   GERD  P:   NPO PPI TF per nutrition  HEMATOLOGIC A:   Recent GI Bleed  P:  Trend CBC   INFECTIOUS A:   Suspected Urosepsis  -U/A > Small Bacteria +Hematuria +yeast, WBC 10.6  H/O LLE Cellulitis, Diabetic Left foot infection s/p left SFA and popliteal atherectomy and ballon angioplasty  P:   Trend WBC and Fever Curve Trend PCT and LA  Follow culture data  Zosyn/Vancomyin    ENDOCRINE A:   DM H/O Hypothyroidism    P:   Trend Glucose SSI  Continue Synthroid   NEUROLOGIC A:   Metabolic Encephalopathy  Ammonia 23 P:   RASS goal: 0/-1  Fentanyl/Versed PRN to achieve RASS    FAMILY  - Updates: No family bedside to update  - Inter-disciplinary family meet or Palliative Care meeting due by:  10/30/2017  The patient is critically ill with multiple organ systems failure and requires high complexity decision making for assessment and support, frequent evaluation and titration of  therapies, application of advanced monitoring technologies and extensive interpretation of multiple databases.   Critical Care Time devoted to patient care services described in this note is  35  Minutes. This time reflects time of care of this signee Dr Jennet Maduro. This critical care time does not reflect procedure time, or teaching time or supervisory time of PA/NP/Med student/Med Resident etc but could involve care discussion time.  Rush Farmer, M.D. [facility] Pulmonary/Critical Care Medicine. Pager: [phone]. After hours pager: [phone].

## 2017-10-23 NOTE — Progress Notes (Addendum)
CRITICAL VALUE ALERT  Critical Value:  Lactic 2.0 troponin 0.42  Date & Time Notied:  10/23/17 0715  Provider Notified: elink notified; MD on floor; day shift nurse Clarita Crane made aware and will report  Orders Received/Actions taken:

## 2017-10-23 NOTE — Consult Note (Signed)
Reason for Consult:question of ICD malfunction  Referring Physician: Dr. Jamesetta So is an 76 y.o. male.   HPI: The patient is a 76 year old man with long-standing chronic systolic heart failure, left bundle branch block, morbid obesity, status post biventricular ICD implantation.  He had cellulitis of the left foot on admission 3 weeks ago, and this was complicated by a GI bleed.  Because of severe peripheral vascular disease, he underwent atherectomy of his left leg.  The patient was admitted to the hospital with altered mental status yesterday, and was intubated.  He was placed on empiric antibiotic therapy.  He was noted to have a very rapid atrial fibrillation.  Interrogation of the patient's ICD was performed under my direction which demonstrates that he has been in atrial fibrillation for less than a week, probably 4 days ago.  In addition his fluid index is markedly elevated also during the same timeframe.  PMH: Past Medical History:  Diagnosis Date  . AICD (automatic cardioverter/defibrillator) present   . Anemia   . Anemia-chronic    a. Pt reports h/o anemia - was offered IV iron in past by nephrologist but declined and has taken PO instead.  . Aneurysm, cerebral   . Arthritis    bilateral wrist and fingers  . BPH (benign prostatic hypertrophy)   . Cardiomyopathy, ischemic    LVEF 25-35%.  . Chronic kidney disease, stage III (moderate) (HCC)    Followed by Dr. Fausto Skillern.  . Coronary atherosclerosis of native coronary artery    a. DES to RCA 03/2009. b. s/p PTCA to RCA for ISR, 05/2010. c. 03/2013 NSTEMI 2/2 severe prox RCA s/p PTCA/DES, med rx for residual dz.  . Essential hypertension, benign   . Hyperlipidemia   . Hypothyroidism   . LBBB (left bundle branch block)    s/p BiV ICD Implanted by Dr Graciela Husbands  . Morbid obesity (HCC)   . MRSA (methicillin resistant Staphylococcus aureus)   . Myocardial infarction (HCC)   . Paroxysmal atrial fibrillation (HCC)    a.  Coumadin discontinue in 2010 when the patient required ASA/Plavix for stenting. b. Coumadin restarted 05/2013, Plavix continued, ASA stopped.   . Pneumonia    years ago 67  . Pulmonary nodule    CXR, 03/2013, [facility] - not described on any subsequent chest x-rays however, could be a vascular structure  . TIA (transient ischemic attack)    Multiple TIAs  . Type 2 diabetes mellitus (HCC)     PSHX: Past Surgical History:  Procedure Laterality Date  . BIV ICD GENERATOR CHANGEOUT N/A 03/18/2017   Medtronic Claria MRI conditional CRT D device implated by Dr Johney Frame  . CHOLECYSTECTOMY    . ESOPHAGOGASTRODUODENOSCOPY N/A 10/06/2017   Procedure: ESOPHAGOGASTRODUODENOSCOPY (EGD);  Surgeon: Kerin Salen, MD;  Location: [facility];  Service: Gastroenterology;  Laterality: N/A;  . ESOPHAGOGASTRODUODENOSCOPY N/A 10/07/2017   Procedure: ESOPHAGOGASTRODUODENOSCOPY (EGD);  Surgeon: Kathi Der, MD;  Location: [facility];  Service: Gastroenterology;  Laterality: N/A;  . ESOPHAGOGASTRODUODENOSCOPY (EGD) WITH PROPOFOL N/A 10/05/2017   Procedure: ESOPHAGOGASTRODUODENOSCOPY (EGD) WITH PROPOFOL;  Surgeon: West Bali, MD;  Location: [facility];  Service: Endoscopy;  Laterality: N/A;  has ICD, just FYI  . ICD placement  05/06/2011   Medtronic CRT-D  . LEFT HEART CATHETERIZATION WITH CORONARY ANGIOGRAM N/A 04/17/2013   Procedure: LEFT HEART CATHETERIZATION WITH CORONARY ANGIOGRAM;  Surgeon: Vesta Mixer, MD;  Location: [facility] CATH LAB;  Service: Cardiovascular;  Laterality: N/A;  . LOWER EXTREMITY ANGIOGRAPHY Left  10/12/2017   Procedure: Lower Extremity Angiography;  Surgeon: Nada Libman, MD;  Location: [facility] INVASIVE CV LAB;  Service: Cardiovascular;  Laterality: Left;  . LUNG SURGERY     24 - 25 yrs. old  . PERIPHERAL VASCULAR ATHERECTOMY Left 10/12/2017   Procedure: PERIPHERAL VASCULAR ATHERECTOMY;  Surgeon: Nada Libman, MD;  Location: [facility] INVASIVE CV LAB;  Service: Cardiovascular;  Laterality:  Left;  SFA/POPLITEAL  . TOTAL KNEE ARTHROPLASTY    . VASECTOMY      FAMHX: Family History  Problem Relation Age of Onset  . Diabetes Mother        Bilateral leg amputation  . Heart disease Mother        Before age 7  . Hypertension Mother   . Liver disease Mother        fatty liver   . Cancer Father        Prostate and Bone  . Diabetes Father   . Cancer Brother        Prostate  . Heart disease Other        family h/o premature cardiovascular disease  . Cancer Sister        Colon   . Hypertension Sister     Social History:  reports that he quit smoking about 52 years ago. His smoking use included cigarettes. He started smoking about 67 years ago. He has a 6.00 pack-year smoking history. He quit smokeless tobacco use about 52 years ago. He reports that he drinks alcohol. He reports that he does not use drugs.  Allergies:  Allergies  Allergen Reactions  . Allegra [Fexofenadine]   . Biaxin [Clarithromycin]   . Crestor [Rosuvastatin Calcium]   . Glucophage [Metformin Hcl]   . Trimethoprim   . Codeine Other (See Comments)    blisters, but able to take hydrocodone    Medications: I have reviewed the patient's current medications.  Dg Chest Port 1 View  Result Date: 10/23/2017 CLINICAL DATA:  76 year old male status post intubation. EXAM: PORTABLE CHEST 1 VIEW COMPARISON:  Earlier chest radiograph dated 10/23/2017 FINDINGS: Endotracheal tube with tip above the carina in similar positioning. Right IJ central venous catheter as seen previously. Left lung base densities may represent atelectatic changes. Pneumonia is not excluded. Overall there has been interval increase in the densities at the left lung base compared to the earlier radiograph. A small left pleural effusion is not entirely excluded. There is no pneumothorax. Stable cardiomegaly. Left pectoral AICD device. No acute osseous pathology. IMPRESSION: 1. Endotracheal tube and right IJ central line in similar position. 2.  Interval increase in the left lung base densities which may represent atelectatic changes, although infiltrate is not excluded. A small left pleural effusion may be present. Electronically Signed   By: Elgie Collard M.D.   On: 10/23/2017 06:53   Dg Abd Portable 1v  Result Date: 10/23/2017 CLINICAL DATA:  75 year old male with an orogastric tube in place. Assess tube location. EXAM: PORTABLE ABDOMEN - 1 VIEW COMPARISON:  Prior abdominal radiograph 10/06/2017 FINDINGS: A single radiograph of the upper abdomen is submitted for review. The orogastric tube is in good position projecting over the gastric body. Incompletely imaged biventricular cardiac rhythm maintenance device. The tip of a venous catheter overlies the mid right atrium. Mild patchy airspace opacity in the left lower lobe may reflect infiltrate or atelectasis. The visualized bowel gas pattern is not obstructed. IMPRESSION: 1. Well-positioned gastric tube projects over the gastric body. 2. Left basilar patchy airspace opacity  may reflect atelectasis or infiltrate. Electronically Signed   By: Malachy Moan M.D.   On: 10/23/2017 08:35    ROS  As stated in the HPI and negative for all other systems.  Physical Exam  Vitals:Blood pressure (!) 91/58, pulse (!) 125, temperature 99.7 F (37.6 C), resp. rate 19, weight 255 lb 4.7 oz (115.8 kg), SpO2 99 %.  Intubated and sedated, does not open eyes to commands. HEENT: ET tube in place Neck: 9 cm JVD, no thyromegally Lymphatics:  No adenopathy Back:  No CVA tenderness Lungs:   scattered rales and rhonchi HEART:  IRegular tachy rate rhythm, no murmurs, no rubs, no clicks Abd:  soft, positive bowel sounds, no organomegally, no rebound, no guarding Ext:  2 plus pulses, no edema, no cyanosis, no clubbing Skin:  No rashes no nodules Neuro:  CN II through XII intact, motor grossly intact  Telemetry -atrial fibrillation with intermittent ventricular pacing  Chest x-ray -reviewed  ICD  interrogation -reviewed under my direction.  Atrial fibrillation is underlying rhythm with normal biventricular ICD function.  Optivol is elevated.   Assessment/Plan: 1.  Atrial fibrillation with a rapid ventricular response -the patient blood pressure is soft.  We will try intravenous digoxin.  He may require TEE guided cardioversion 2.  ICD -his Medtronic biventricular ICD has been interrogated.  Only problem is atrial under sensing.  His device has been reprogrammed 3.  Ventilatory dependent respiratory failure -he is intubated.  He has evidence of volume overload on his ICD interrogation.  This coincides with atrial fibrillation which is been present for nearly a week. 4.  Coronary artery disease -his troponin is minimally elevated.  No indication to work this up at this time.  If he is difficult to extubate, right heart catheterization might be a consideration  Kristopher Glee 10/23/2017, 4:07 PM

## 2017-10-23 NOTE — Consult Note (Signed)
WOC Nurse wound consult note Reason for Consult:Reconsulted for wound care orders for buttocks (MASD) and chronic nonhealing left foot ulcer in a patient with multiple comorbid conditions. Patient has been seen by 2 of my partners in the past 4 weeks. Last seen by M. Austin on 11/27 and 10/13/17 Wound type: neuropathic, moisture Pressure Injury POA: NA Measurement: 8cm x 5cm affected area with peeling epidermis with central eschar (nonhealing area) measuring 3cm round. MASD is resolving, but remains erythematous with scattered open areas. Wound bed: As described above Drainage (amount, consistency, odor) Moderate amount of light yellow exudate consistent with dissolving necrotic tissue on old dressing. Strong odor consistent with necrotic tissue. Periwound: AS noted above, peeling epidermis. Dressing procedure/placement/frequency: I will continue the POC initiated by my partner, K. Sanders on 10/08/17 for collagenase (Santyl) to the left foot and to the buttocks by my partner M. Austin on 11/28 using moisture barrier cream Q2H and PRN.  Turning and repositioning from side to side is already in place, but guidance is provided for Nursing for this in the Orders.  WOC nursing team will not follow, but will remain available to this patient, the nursing and medical teams.  Please re-consult if needed. Thanks, Ladona Mow, MSN, RN, GNP, [facility]  Pager# [phone]

## 2017-10-23 NOTE — Progress Notes (Signed)
CRITICAL VALUE ALERT  Critical Value: troponin 3.42  Date & Time Notied:  10/23/17 11:55 PM  Provider Notified: Dr. Deterdine  Orders Received/Actions taken: no new orders at this time

## 2017-10-23 NOTE — H&P (Signed)
PULMONARY / CRITICAL CARE MEDICINE   Name: [name] MRN: [id number] DOB: [birth year]     ADMISSION DATE:  10/23/2017 CONSULTATION DATE:  10/23/2017  REFERRING MD:  [facility]    CHIEF COMPLAINT:  AMS  HISTORY OF PRESENT ILLNESS:   76 year old male with PMH of DM, CAD on plavix, PAF on coumadin, CKD stage 3, RA, ischemic cardiomyopathy s/p ICD  Recent Admission 11/12-11/30. Cellulitis to left foot. Stay complicated by GI bleed. Underwent L SFA/popliteral atherectomy on 10/12/2017.   Presented to OSH on 12/7 with confusion and AMS. Wife reports that since discharge to SNF patient has not been acting like self. However 12/7 she went to nursing home to feed patient lunch and noticed that he was more altered/lethargic then usual. CT Head negative. LA 1.8. U/A with few bacteria +yeast. Given Vancomycin and Levofloxacin. Intubated for airway protection. Transferred to [facility] for further workup.   PAST MEDICAL HISTORY :  He  has a past medical history of AICD (automatic cardioverter/defibrillator) present, Anemia, Anemia-chronic, Aneurysm, cerebral, Arthritis, BPH (benign prostatic hypertrophy), Cardiomyopathy, ischemic, Chronic kidney disease, stage III (moderate) (HCC), Coronary atherosclerosis of native coronary artery, Essential hypertension, benign, Hyperlipidemia, Hypothyroidism, LBBB (left bundle branch block), Morbid obesity (HCC), MRSA (methicillin resistant Staphylococcus aureus), Myocardial infarction (HCC), Paroxysmal atrial fibrillation (HCC), Pneumonia, Pulmonary nodule, TIA (transient ischemic attack), and Type 2 diabetes mellitus (HCC).  PAST SURGICAL HISTORY: He  has a past surgical history that includes Cholecystectomy; Total knee arthroplasty; Vasectomy; Lung surgery; ICD placement (05/06/2011); left heart catheterization with coronary angiogram (N/A, 04/17/2013); BIV ICD GENERATOR CHANGEOUT (N/A, 03/18/2017); Esophagogastroduodenoscopy (N/A, 10/06/2017);  Esophagogastroduodenoscopy (N/A, 10/07/2017); Esophagogastroduodenoscopy (egd) with propofol (N/A, 10/05/2017); Lower Extremity Angiography (Left, 10/12/2017); and PERIPHERAL VASCULAR ATHERECTOMY (Left, 10/12/2017).  Allergies  Allergen Reactions  . Allegra [Fexofenadine]   . Biaxin [Clarithromycin]   . Crestor [Rosuvastatin Calcium]   . Glucophage [Metformin Hcl]   . Trimethoprim   . Codeine Other (See Comments)    blisters, but able to take hydrocodone    No current facility-administered medications on file prior to encounter.    Current Outpatient Medications on File Prior to Encounter  Medication Sig  . allopurinol (ZYLOPRIM) 100 MG tablet Take 100 mg by mouth daily.  . carvedilol (COREG) 12.5 MG tablet TAKE 1 AND 1/2 TABLETS BY MOUTH TWICE DAILY WITH MEALS (DUE FOR FOLLOW UP)  . cephALEXin (KEFLEX) 250 MG capsule Take 1 capsule (250 mg total) by mouth every 8 (eight) hours. For 5days  . clopidogrel (PLAVIX) 75 MG tablet Take 1 tablet (75 mg total) by mouth daily.  Marland Kitchen HYDROcodone-acetaminophen (NORCO) 7.5-325 MG tablet Take 1 tablet by mouth every 6 (six) hours as needed for moderate pain.  Marland Kitchen insulin lispro protamine-lispro (HUMALOG 75/25 MIX) (75-25) 100 UNIT/ML SUSP injection Inject 10 Units into the skin 2 (two) times daily with a meal. Take 10units BID with meals  . levothyroxine (SYNTHROID, LEVOTHROID) 50 MCG tablet Take 50 mcg by mouth daily.  . nitroGLYCERIN (NITROSTAT) 0.4 MG SL tablet Place 0.4 mg under the tongue every 5 (five) minutes as needed for chest pain. For chest pain  . pantoprazole (PROTONIX) 40 MG tablet Take 1 tablet (40 mg total) by mouth 2 (two) times daily.  Marland Kitchen sulfaSALAzine (AZULFIDINE EN-TABS) 500 MG EC tablet Take 1 tablet (500 mg total) by mouth 2 (two) times daily.  . tamsulosin (FLOMAX) 0.4 MG CAPS capsule Take 0.4 mg by mouth.  . torsemide (DEMADEX) 10 MG tablet Take 1 tablet (10 mg  total) by mouth 2 (two) times daily.  . Zinc 50 MG CAPS Take 50 mg by  mouth daily.     FAMILY HISTORY:  His indicated that his mother is deceased. He indicated that his father is deceased. He indicated that his sister is deceased. He indicated that his brother is alive. He indicated that the status of his other is unknown.   SOCIAL HISTORY: He  reports that he quit smoking about 52 years ago. His smoking use included cigarettes. He started smoking about 67 years ago. He has a 6.00 pack-year smoking history. He quit smokeless tobacco use about 52 years ago. He reports that he drinks alcohol. He reports that he does not use drugs.  REVIEW OF SYSTEMS:   Unable to review as patient is intubated and sedated   SUBJECTIVE:   VITAL SIGNS: SpO2 100%   HEMODYNAMICS:    VENTILATOR SETTINGS: Vent Mode: PRVC FiO2 (%):  [100 %] 100 % Set Rate:  [16 bmp] 16 bmp Vt Set:  [550 mL] 550 mL PEEP:  [5 cmH20] 5 cmH20 Plateau Pressure:  [16 cmH20] 16 cmH20  INTAKE / OUTPUT: No intake/output data recorded.  PHYSICAL EXAMINATION: General:  Adult male, on vent  Neuro:  Lethargic, opens eyes, does not follow commands, withdrawals to pain  HEENT:  ETT in place  Cardiovascular:  Paced, Tachy, no MRG  Lungs:  Diminished to bases, no wheeze/crackles  Abdomen:  Distended, active bowel sounds  Musculoskeletal:  +3 BLE Edema  Skin: left foot black diabetic ulcer     LABS:  BMET No results for input(s): NA, K, CL, CO2, BUN, CREATININE, GLUCOSE in the last 168 hours.  Electrolytes No results for input(s): CALCIUM, MG, PHOS in the last 168 hours.  CBC No results for input(s): WBC, HGB, HCT, PLT in the last 168 hours.  Coag's No results for input(s): APTT, INR in the last 168 hours.  Sepsis Markers No results for input(s): LATICACIDVEN, PROCALCITON, O2SATVEN in the last 168 hours.  ABG No results for input(s): PHART, PCO2ART, PO2ART in the last 168 hours.  Liver Enzymes No results for input(s): AST, ALT, ALKPHOS, BILITOT, ALBUMIN in the last 168  hours.  Cardiac Enzymes No results for input(s): TROPONINI, PROBNP in the last 168 hours.  Glucose No results for input(s): GLUCAP in the last 168 hours.  Imaging No results found.   STUDIES:  CT Head 12/7 > no acute, moderate to severe parenchymal brain volume loss and moderate small vessel ischemic disease  CXR 12/7 > Cardiomegaly and mild interstitial edema, left basilar opacity likely due to atelectasis     CULTURES: Blood 12/8 >> Urine 12/8 >> Sputum 12/8 >>   ANTIBIOTICS: Vancomycin 12/7 >> Levofloxacin 12/7  Zosyn 12/8 >>    SIGNIFICANT EVENTS: 12/7 > Presents to OSH   LINES/TUBES: ETT 12/7 >>   DISCUSSION: 76 year old male presents to ED with AMS from SNF. Intubated for airway protection. Transferred to [facility] for further management.   ASSESSMENT / PLAN:  PULMONARY A: Respiratory Insufficieny in setting of metabolic encephalopathy  P:   Vent Support Wean as tolerated  ABG/CXR now  Pulmonary Hygiene   CARDIOVASCULAR A:  Septic Shock  H/O PAF, CAD, Ischemic Cardiomyopathy (EF 20-25) s/p ICD  P:  Cardiac Monitoring  Coumadin as been held outpatient due to recent GI bleed > Plans to restart in few weeks  Wean Levophed to maintain MAP >65  Hold Coreg and Demadex in setting of hypotension Continue Plavix  Trend CVP   RENAL A:   Anion Gap Metabolic Acidosis  Acute on Chronic kidney disease  CKD stage 3 (Baseline crt 1.9-2) Urinary Rentention P:   Trend BMP  Avoid Nephrotoxic Medications  Replace electrolytes as indicated   GASTROINTESTINAL A:   GERD  P:   NPO PPI  HEMATOLOGIC A:   Recent GI Bleed  P:  Trend CBC   INFECTIOUS A:   Suspected Urosepsis  -U/A > Small Bacteria +Hematuria +yeast, WBC 10.6  H/O LLE Cellulitis, Diabetic Left foot infection s/p left SFA and popliteal atherectomy and ballon angioplasty  P:   Trend WBC and Fever Curve Trend PCT and LA  Follow Culture Data  Zosyn/Vancomyin    ENDOCRINE A:    DM H/O Hypothyroidism    P:   Trend Glucose SSI  Continue Synthroid   NEUROLOGIC A:   Metabolic Encephalopathy  Ammonia 23 P:   RASS goal: 0/-1  Fentanyl/Versed PRN to achieve RASS    FAMILY  - Updates: no family at bedside   - Inter-disciplinary family meet or Palliative Care meeting due by:  10/30/2017   CC Time: 55 minutes   Jovita Kussmaul, AGACNP-BC [facility]  Pgr: [phone]  PCCM Pgr: [phone]

## 2017-10-24 ENCOUNTER — Inpatient Hospital Stay (HOSPITAL_COMMUNITY): Payer: PPO

## 2017-10-24 DIAGNOSIS — G9341 Metabolic encephalopathy: Secondary | ICD-10-CM

## 2017-10-24 DIAGNOSIS — A419 Sepsis, unspecified organism: Secondary | ICD-10-CM

## 2017-10-24 DIAGNOSIS — N3000 Acute cystitis without hematuria: Secondary | ICD-10-CM

## 2017-10-24 DIAGNOSIS — I4891 Unspecified atrial fibrillation: Secondary | ICD-10-CM

## 2017-10-24 DIAGNOSIS — J9601 Acute respiratory failure with hypoxia: Secondary | ICD-10-CM

## 2017-10-24 DIAGNOSIS — R6521 Severe sepsis with septic shock: Secondary | ICD-10-CM

## 2017-10-24 LAB — GLUCOSE, CAPILLARY
GLUCOSE-CAPILLARY: 138 mg/dL — AB (ref 65–99)
GLUCOSE-CAPILLARY: 170 mg/dL — AB (ref 65–99)
GLUCOSE-CAPILLARY: 92 mg/dL (ref 65–99)
Glucose-Capillary: 137 mg/dL — ABNORMAL HIGH (ref 65–99)
Glucose-Capillary: 122 mg/dL — ABNORMAL HIGH (ref 65–99)
Glucose-Capillary: 245 mg/dL — ABNORMAL HIGH (ref 65–99)
Glucose-Capillary: 236 mg/dL — ABNORMAL HIGH (ref 65–99)
Glucose-Capillary: 216 mg/dL — ABNORMAL HIGH (ref 65–99)
Glucose-Capillary: 138 mg/dL — ABNORMAL HIGH (ref 65–99)

## 2017-10-24 LAB — BASIC METABOLIC PANEL
ANION GAP: 10 (ref 5–15)
BUN: 72 mg/dL — ABNORMAL HIGH (ref 6–20)
CALCIUM: 7.8 mg/dL — AB (ref 8.9–10.3)
CO2: 19 mmol/L — ABNORMAL LOW (ref 22–32)
CREATININE: 2.91 mg/dL — AB (ref 0.61–1.24)
Chloride: 108 mmol/L (ref 101–111)
GFR, EST AFRICAN AMERICAN: 22 mL/min — AB (ref >60)
GFR, EST NON AFRICAN AMERICAN: 19 mL/min — AB (ref >60)
Glucose, Bld: 158 mg/dL — ABNORMAL HIGH (ref 65–99)
Potassium: 3.2 mmol/L — ABNORMAL LOW (ref 3.5–5.1)
SODIUM: 137 mmol/L (ref 135–145)

## 2017-10-24 LAB — BLOOD GAS, ARTERIAL
Acid-base deficit: 4.3 mmol/L — ABNORMAL HIGH (ref 0.0–2.0)
BICARBONATE: 20.1 mmol/L (ref 20.0–28.0)
Drawn by: 36277
FIO2: 0.4
O2 Saturation: 98.5 %
PEEP/CPAP: 5 cmH2O
Patient temperature: 96.8
RATE: 16 resp/min
VT: 550 mL
pCO2 arterial: 34.5 mmHg (ref 32.0–48.0)
pH, Arterial: 7.378 (ref 7.350–7.450)
pO2, Arterial: 144 mmHg — ABNORMAL HIGH (ref 83.0–108.0)

## 2017-10-24 LAB — PROCALCITONIN: PROCALCITONIN: 1.09 ng/mL

## 2017-10-24 LAB — CBC
HCT: 26.1 % — ABNORMAL LOW (ref 39.0–52.0)
HEMOGLOBIN: 8.4 g/dL — AB (ref 13.0–17.0)
MCH: 31 pg (ref 26.0–34.0)
MCHC: 32.2 g/dL (ref 30.0–36.0)
MCV: 96.3 fL (ref 78.0–100.0)
PLATELETS: 250 10*3/uL (ref 150–400)
RBC: 2.71 MIL/uL — AB (ref 4.22–5.81)
RDW: 17.5 % — ABNORMAL HIGH (ref 11.5–15.5)
WBC: 13.1 10*3/uL — AB (ref 4.0–10.5)

## 2017-10-24 LAB — TROPONIN I
TROPONIN I: 0.42 ng/mL — AB (ref <0.03)
TROPONIN I: 2.33 ng/mL — AB (ref <0.03)
Troponin I: 1.32 ng/mL (ref <0.03)

## 2017-10-24 LAB — URINE CULTURE: Culture: NO GROWTH

## 2017-10-24 LAB — PHOSPHORUS: PHOSPHORUS: 3.4 mg/dL (ref 2.5–4.6)

## 2017-10-24 LAB — MAGNESIUM: MAGNESIUM: 1.7 mg/dL (ref 1.7–2.4)

## 2017-10-24 MED ORDER — CHLORHEXIDINE GLUCONATE 0.12 % MT SOLN
15.0000 mL | Freq: Two times a day (BID) | OROMUCOSAL | Status: DC
  Administered 2017-10-25: 15 mL via OROMUCOSAL

## 2017-10-24 MED ORDER — INSULIN ASPART 100 UNIT/ML ~~LOC~~ SOLN
2.0000 [IU] | SUBCUTANEOUS | Status: DC
  Administered 2017-10-24: 4 [IU] via SUBCUTANEOUS
  Administered 2017-10-24: 6 [IU] via SUBCUTANEOUS

## 2017-10-24 MED ORDER — POTASSIUM CHLORIDE 10 MEQ/50ML IV SOLN
10.0000 meq | INTRAVENOUS | Status: AC
  Administered 2017-10-24 (×6): 10 meq via INTRAVENOUS
  Filled 2017-10-24 (×6): qty 50

## 2017-10-24 MED ORDER — INSULIN ASPART 100 UNIT/ML ~~LOC~~ SOLN
0.0000 [IU] | SUBCUTANEOUS | Status: DC
  Administered 2017-10-24: 7 [IU] via SUBCUTANEOUS
  Administered 2017-10-24: 3 [IU] via SUBCUTANEOUS
  Administered 2017-10-24: 7 [IU] via SUBCUTANEOUS
  Administered 2017-10-25: 3 [IU] via SUBCUTANEOUS
  Administered 2017-10-25: 4 [IU] via SUBCUTANEOUS

## 2017-10-24 MED ORDER — METOPROLOL TARTRATE 5 MG/5ML IV SOLN
5.0000 mg | Freq: Four times a day (QID) | INTRAVENOUS | Status: DC
  Administered 2017-10-24 – 2017-10-25 (×4): 5 mg via INTRAVENOUS
  Filled 2017-10-24 (×5): qty 5

## 2017-10-24 MED ORDER — FUROSEMIDE 10 MG/ML IJ SOLN
40.0000 mg | Freq: Three times a day (TID) | INTRAMUSCULAR | Status: AC
  Administered 2017-10-24 (×2): 40 mg via INTRAVENOUS
  Filled 2017-10-24 (×3): qty 4

## 2017-10-24 MED ORDER — METOPROLOL TARTRATE 25 MG/10 ML ORAL SUSPENSION
25.0000 mg | Freq: Two times a day (BID) | ORAL | Status: DC
  Filled 2017-10-24: qty 10

## 2017-10-24 MED ORDER — MAGNESIUM SULFATE 2 GM/50ML IV SOLN
2.0000 g | Freq: Once | INTRAVENOUS | Status: AC
  Administered 2017-10-24: 2 g via INTRAVENOUS
  Filled 2017-10-24: qty 50

## 2017-10-24 MED ORDER — ORAL CARE MOUTH RINSE: 15.0000 mL | Freq: Two times a day (BID) | OROMUCOSAL | Status: DC

## 2017-10-24 MED ORDER — FENTANYL CITRATE (PF) 100 MCG/2ML IJ SOLN
25.0000 ug | INTRAMUSCULAR | Status: DC | PRN
  Administered 2017-10-24: 50 ug via INTRAVENOUS
  Filled 2017-10-24: qty 2

## 2017-10-24 MED ORDER — INSULIN ASPART 100 UNIT/ML ~~LOC~~ SOLN
5.0000 [IU] | SUBCUTANEOUS | Status: DC
  Administered 2017-10-24 (×2): 5 [IU] via SUBCUTANEOUS

## 2017-10-24 NOTE — Progress Notes (Signed)
eLink Physician-Brief Progress Note Patient Name: [name] DOB: [birth year] MRN: [id number]   Date of Service  10/24/2017  HPI/Events of Note  Multiple issues: Extubated earlier today. Patient still has sedation orders for mechanical ventilation and 2. Pain d/t foot wound.   eICU Interventions  Will order: 1. D/C Fentanyl and Versed sedation orders.  2. Fentanyl 25-50 mcg IV Q 2 hours PRN pain.      Intervention Category Intermediate Interventions: Pain - evaluation and management  Ariabella Brien Eugene 10/24/2017, 11:22 PM

## 2017-10-24 NOTE — Progress Notes (Signed)
Pt has brady down to the 40's. Pt does have an ICD in place. MD has been notified. No orders have been given at the moment. Will continue to monitor.

## 2017-10-24 NOTE — Progress Notes (Signed)
Progress Note  Patient Name: [name] Date of Encounter: 10/24/2017  Primary Cardiologist: Dr. Rayann Heman  Subjective   Remains intubated and sedated  Inpatient Medications    Scheduled Meds: . chlorhexidine gluconate (MEDLINE KIT)  15 mL Mouth Rinse BID  . Chlorhexidine Gluconate Cloth  6 each Topical Daily  . clopidogrel  75 mg Per Tube Daily  . collagenase   Topical Daily  . feeding supplement (PRO-STAT SUGAR FREE 64)  30 mL Per Tube Daily  . feeding supplement (VITAL HIGH PROTEIN)  1,000 mL Per Tube Q24H  . heparin  5,000 Units Subcutaneous Q8H  . insulin aspart  2-6 Units Subcutaneous Q4H  . insulin glargine  5 Units Subcutaneous BID  . levothyroxine  25 mcg Intravenous Daily  . mouth rinse  15 mL Mouth Rinse 10 times per day  . pantoprazole (PROTONIX) IV  40 mg Intravenous QHS  . sodium chloride flush  10-40 mL Intracatheter Q12H   Continuous Infusions: . sodium chloride 250 mL (10/24/17 0900)  . sodium chloride 125 mL/hr at 10/24/17 0900  . norepinephrine (LEVOPHED) Adult infusion Stopped (10/24/17 0523)  . piperacillin-tazobactam (ZOSYN)  IV Stopped (10/24/17 0913)  . potassium chloride 10 mEq (10/24/17 0931)  . vancomycin Stopped (10/24/17 5670)   PRN Meds: sodium chloride, acetaminophen (TYLENOL) oral liquid 160 mg/5 mL, fentaNYL (SUBLIMAZE) injection, fentaNYL (SUBLIMAZE) injection, midazolam, midazolam, sodium chloride flush   Vital Signs    Vitals:   10/24/17 0645 10/24/17 0700 10/24/17 0800 10/24/17 0900  BP:  (!) 96/39 (!) 115/52 (!) 75/40  Pulse: 86 91 63   Resp: '17 17 15 12  ' Temp: 98.2 F (36.8 C) 98.2 F (36.8 C) 98.2 F (36.8 C) 98.4 F (36.9 C)  TempSrc:      SpO2: 100% 100% 99%   Weight:        Intake/Output Summary (Last 24 hours) at 10/24/2017 1012 Last data filed at 10/24/2017 0931 Gross per 24 hour  Intake 5340.13 ml  Output 705 ml  Net 4635.13 ml   Filed Weights   10/23/17 1058 10/24/17 0344  Weight: 255 lb 4.7 oz (115.8 kg)  255 lb 11.7 oz (116 kg)    Telemetry    Atrial fibrillation with a rapid ventricular response, with intermittent capture and ventricular sensed response pacing- Personally Reviewed  ECG    None- Personally Reviewed  Physical Exam   GEN:  Intubated and sedated Neck:  7 cm JVD, ET tube in place Cardiac: IRIRR, no murmurs, rubs, or gallops.  Respiratory:  Scattered rales and rhonchi GI: Soft, obese, nontender, non-distended  MS: No edema; No deformity. Neuro:   Unable to assess   Labs    Chemistry Recent Labs  Lab 10/23/17 0523 10/24/17 0321  NA 135 137  K 4.0 3.2*  CL 100* 108  CO2 19* 19*  GLUCOSE 309* 158*  BUN 64* 72*  CREATININE 3.28* 2.91*  CALCIUM 8.2* 7.8*  PROT 6.3*  --   ALBUMIN 2.2*  --   AST 1,352*  --   ALT 543*  --   ALKPHOS 213*  --   BILITOT 1.0  --   GFRNONAA 17* 19*  GFRAA 19* 22*  ANIONGAP 16* 10     Hematology Recent Labs  Lab 10/23/17 0523 10/24/17 0321  WBC 14.3* 13.1*  RBC 2.82* 2.71*  HGB 8.6* 8.4*  HCT 27.2* 26.1*  MCV 96.5 96.3  MCH 30.5 31.0  MCHC 31.6 32.2  RDW 17.0* 17.5*  PLT 339 250  Cardiac Enzymes Recent Labs  Lab 10/23/17 0523 10/23/17 1123 10/23/17 2249 10/24/17 0321  TROPONINI 0.42* 2.81* 3.42* 2.33*   No results for input(s): TROPIPOC in the last 168 hours.   BNPNo results for input(s): BNP, PROBNP in the last 168 hours.   DDimer No results for input(s): DDIMER in the last 168 hours.   Radiology    Dg Chest Port 1 View  Result Date: 10/24/2017 CLINICAL DATA:  Followup intubated patient. EXAM: PORTABLE CHEST 1 VIEW COMPARISON:  10/23/2017 FINDINGS: Lung volumes are low. There is hazy lung base opacity, left greater than right, likely combination of small effusions and atelectasis. No pulmonary edema. No pneumothorax. Endotracheal tube, nasal/ orogastric tube, right internal jugular central venous line and left anterior chest wall biventricular cardioverter-defibrillator are stable in well positioned.  IMPRESSION: 1. No significant change from the most recent prior study allowing for lower lung volumes on the current exam. 2. Mild, left greater than right, lung base atelectasis with probable small effusions. 3. Support apparatus is well positioned. Electronically Signed   By: Lajean Manes M.D.   On: 10/24/2017 07:43   Dg Chest Port 1 View  Result Date: 10/23/2017 CLINICAL DATA:  76 year old male status post intubation. EXAM: PORTABLE CHEST 1 VIEW COMPARISON:  Earlier chest radiograph dated 10/23/2017 FINDINGS: Endotracheal tube with tip above the carina in similar positioning. Right IJ central venous catheter as seen previously. Left lung base densities may represent atelectatic changes. Pneumonia is not excluded. Overall there has been interval increase in the densities at the left lung base compared to the earlier radiograph. A small left pleural effusion is not entirely excluded. There is no pneumothorax. Stable cardiomegaly. Left pectoral AICD device. No acute osseous pathology. IMPRESSION: 1. Endotracheal tube and right IJ central line in similar position. 2. Interval increase in the left lung base densities which may represent atelectatic changes, although infiltrate is not excluded. A small left pleural effusion may be present. Electronically Signed   By: Anner Crete M.D.   On: 10/23/2017 06:53   Dg Abd Portable 1v  Result Date: 10/23/2017 CLINICAL DATA:  76 year old male with an orogastric tube in place. Assess tube location. EXAM: PORTABLE ABDOMEN - 1 VIEW COMPARISON:  Prior abdominal radiograph 10/06/2017 FINDINGS: A single radiograph of the upper abdomen is submitted for review. The orogastric tube is in good position projecting over the gastric body. Incompletely imaged biventricular cardiac rhythm maintenance device. The tip of a venous catheter overlies the mid right atrium. Mild patchy airspace opacity in the left lower lobe may reflect infiltrate or atelectasis. The visualized bowel  gas pattern is not obstructed. IMPRESSION: 1. Well-positioned gastric tube projects over the gastric body. 2. Left basilar patchy airspace opacity may reflect atelectasis or infiltrate. Electronically Signed   By: Jacqulynn Cadet M.D.   On: 10/23/2017 08:35    Cardiac Studies   None  Patient Profile     76 y.o. male admitted with altered mental status and respiratory failure  Assessment & Plan    1.  Atrial fibrillation -his blood pressure is improved.  He is received 2 doses of intravenous digoxin.  With blood pressure improvement, will gently titrate his AV nodal blocking drugs 2.  Coronary artery disease  -his troponin has increased slightly, but I strongly suspect is related to his underlying comorbidities including rapid atrial fibrillation.  Will use IV metoprolol to attempt to reduce myocardial oxygen demand 3.  Ventilatory dependent respiratory failure -he remains intubated.  Hopefully he can be extubated  with improvement in his overall hemodynamics. 4.  ICD -review of the telemetry strips demonstrates intermittent non-capture which is secondary to ventricular sensed response pacing along with intermittent capture with a relatively isoelectric QRS.  Overall normal ICD function.  For questions or updates, please contact [facility] Please consult [email or web address] for contact info under Cardiology/STEMI.      Signed, Cristopher Peru, MD  10/24/2017, 10:12 AM  Patient ID: Roney Jaffe, male   DOB: 1941-10-12, 76 y.o.   MRN: [id number]

## 2017-10-24 NOTE — Progress Notes (Signed)
eLink Physician-Brief Progress Note Patient Name: [name] DOB: 1941-10-06 MRN: [id number]   Date of Service  10/24/2017  HPI/Events of Note  Hypokalemia and relatively low mag  eICU Interventions  Potassium and mag replaced     Intervention Category Intermediate Interventions: Electrolyte abnormality - evaluation and management  Mikaylee Arseneau 10/24/2017, 4:51 AM

## 2017-10-24 NOTE — Procedures (Signed)
Extubation Procedure Note  Patient Details:   Name: [name] DOB: 05-28-1941 MRN: [id number]   Airway Documentation:  Airway (Active)  Secured at (cm) 22 cm 10/24/2017  8:00 AM  Measured From Lips 10/24/2017  8:00 AM  Secured Location Left 10/24/2017  8:00 AM  Secured By [employer] 10/24/2017  8:00 AM  Tube Holder Repositioned Yes 10/24/2017  8:00 AM  Site Condition Dry 10/24/2017  8:00 AM    Evaluation  O2 sats: stable throughout Complications: No apparent complications Patient did tolerate procedure well. Bilateral Breath Sounds: Clear, Diminished   Yes   Pt extubated per MD order.  Pt tolerated well, O2 sats 95% on 4L [place] .  RN at bedside.  RT will continue to monitor.   Ronny Flurry 10/24/2017, 12:06 PM

## 2017-10-24 NOTE — Progress Notes (Signed)
PULMONARY / CRITICAL CARE MEDICINE   Name: [name] MRN: [id number] DOB: [birth year]     ADMISSION DATE:  10/23/2017 CONSULTATION DATE:  10/23/2017  REFERRING MD:  [facility]    CHIEF COMPLAINT:  AMS  HISTORY OF PRESENT ILLNESS:   76 year old male with PMH of DM, CAD on plavix, PAF on coumadin, CKD stage 3, RA, ischemic cardiomyopathy s/p ICD  Recent Admission 11/12-11/30. Cellulitis to left foot. Stay complicated by GI bleed. Underwent L SFA/popliteral atherectomy on 10/12/2017.   Presented to OSH on 12/7 with confusion and AMS. Wife reports that since discharge to SNF patient has not been acting like self. However 12/7 she went to nursing home to feed patient lunch and noticed that he was more altered/lethargic then usual. CT Head negative. LA 1.8. U/A with few bacteria +yeast. Given Vancomycin and Levofloxacin. Intubated for airway protection. Transferred to [facility] for further workup.   SUBJECTIVE:  No events overnight, no new complaints  VITAL SIGNS: BP (!) 75/40   Pulse 63   Temp 98.4 F (36.9 C)   Resp 12   Wt 116 kg (255 lb 11.7 oz)   SpO2 99%   BMI 38.88 kg/m   HEMODYNAMICS:    VENTILATOR SETTINGS: Vent Mode: CPAP;PSV FiO2 (%):  [40 %] 40 % Set Rate:  [16 bmp] 16 bmp Vt Set:  [550 mL] 550 mL PEEP:  [5 cmH20] 5 cmH20 Pressure Support:  [10 cmH20] 10 cmH20 Plateau Pressure:  [14 cmH20-20 cmH20] 16 cmH20  INTAKE / OUTPUT: I/O last 3 completed shifts: In: 5358.8 [I.V.:3740.8; NG/GT:1068; IV Piggyback:550] Out: 580 [Urine:580]  PHYSICAL EXAMINATION: General: Acutely ill appearing male, NAD Neuro: Sedate, opens eyes to pain HEENT: [place]/AT, PERRL, EOM-I and MMM Cardiovascular:  Paced, IRIR, Nl S1/S2 and -M/R/G. Lungs: Decreased BS diffusely Abdomen: Soft, NT, ND and +BS Musculoskeletal:  +2 BLE Edema  Skin: left foot black diabetic ulcer     LABS:  BMET Recent Labs  Lab 10/23/17 0523 10/24/17 0321  NA 135 137  K 4.0 3.2*  CL 100* 108  CO2  19* 19*  BUN 64* 72*  CREATININE 3.28* 2.91*  GLUCOSE 309* 158*   Electrolytes Recent Labs  Lab 10/23/17 0523 10/24/17 0321  CALCIUM 8.2* 7.8*  MG  --  1.7  PHOS  --  3.4   CBC Recent Labs  Lab 10/23/17 0523 10/24/17 0321  WBC 14.3* 13.1*  HGB 8.6* 8.4*  HCT 27.2* 26.1*  PLT 339 250   Coag's No results for input(s): APTT, INR in the last 168 hours.  Sepsis Markers Recent Labs  Lab 10/23/17 0523 10/23/17 0823 10/24/17 0321  LATICACIDVEN 2.0* 1.7  --   PROCALCITON 0.31  --  1.09   ABG Recent Labs  Lab 10/23/17 0611 10/24/17 0345  PHART 7.364 7.378  PCO2ART 37.2 34.5  PO2ART 543.0* 144*   Liver Enzymes Recent Labs  Lab 10/23/17 0523  AST 1,352*  ALT 543*  ALKPHOS 213*  BILITOT 1.0  ALBUMIN 2.2*   Cardiac Enzymes Recent Labs  Lab 10/23/17 1123 10/23/17 2249 10/24/17 0321  TROPONINI 2.81* 3.42* 2.33*   Glucose Recent Labs  Lab 10/23/17 2256 10/23/17 2359 10/24/17 0101 10/24/17 0226 10/24/17 0355 10/24/17 0832  GLUCAP 145* 138* 137* 122* 170* 245*   Imaging Dg Chest Port 1 View  Result Date: 10/24/2017 CLINICAL DATA:  Followup intubated patient. EXAM: PORTABLE CHEST 1 VIEW COMPARISON:  10/23/2017 FINDINGS: Lung volumes are low. There is hazy lung base opacity, left greater  than right, likely combination of small effusions and atelectasis. No pulmonary edema. No pneumothorax. Endotracheal tube, nasal/ orogastric tube, right internal jugular central venous line and left anterior chest wall biventricular cardioverter-defibrillator are stable in well positioned. IMPRESSION: 1. No significant change from the most recent prior study allowing for lower lung volumes on the current exam. 2. Mild, left greater than right, lung base atelectasis with probable small effusions. 3. Support apparatus is well positioned. Electronically Signed   By: Amie Portland M.D.   On: 10/24/2017 07:43   STUDIES:  CT Head 12/7 > no acute, moderate to severe parenchymal  brain volume loss and moderate small vessel ischemic disease  CXR 12/7 > Cardiomegaly and mild interstitial edema, left basilar opacity likely due to atelectasis     CULTURES: Blood 12/8 >> Urine 12/8 >> Sputum 12/8 >>   ANTIBIOTICS: Vancomycin 12/7>>> Levofloxacin 12/7>>> Zosyn 12/8 >> >  SIGNIFICANT EVENTS: 12/7 > Presents to OSH   LINES/TUBES: ETT 12/7 >>  R IJ TLC 12/7>>>  DISCUSSION: 76 year old male presents to ED with AMS from SNF. Intubated for airway protection. Transferred to [facility] for further management.   ASSESSMENT / PLAN:  PULMONARY A: Respiratory Insufficieny in setting of metabolic encephalopathy  P:   Begin PS trials but no extubation until more awake and interactive ABG/CXR now  Pulmonary Hygiene   CARDIOVASCULAR A:  Septic Shock  H/O PAF, CAD, Ischemic Cardiomyopathy (EF 20-25) s/p ICD  P:  Cardiac Monitoring  Coumadin as been held outpatient due to recent GI bleed > Plans to restart in few weeks  Wean Levophed to maintain MAP >65  Hold Coreg and Demadex in setting of hypotension Continue Plavix  Trend CVP   RENAL A:   Anion Gap Metabolic Acidosis  Acute on Chronic kidney disease  CKD stage 3 (Baseline crt 1.9-2) Urinary Rentention P:   Trend BMP  Avoid Nephrotoxic Medications  Replace electrolytes as indicated  Lasix 40 mg IV q8 x2 doses  GASTROINTESTINAL A:   GERD  P:   NPO PPI TF per nutrition  HEMATOLOGIC A:   Recent GI Bleed  P:  Trend CBC   INFECTIOUS A:   Suspected Urosepsis  -U/A > Small Bacteria +Hematuria +yeast, WBC 10.6  H/O LLE Cellulitis, Diabetic Left foot infection s/p left SFA and popliteal atherectomy and ballon angioplasty  P:   Trend WBC and Fever Curve Trend PCT and LA  Follow culture data  Continue Zosyn/Vancomyin    ENDOCRINE A:   DM H/O Hypothyroidism    P:   Trend Glucose SSI  Continue Synthroid   NEUROLOGIC A:   Metabolic Encephalopathy  Ammonia 23 P:   RASS goal: 0/-1   Hold versed and fentanyl for a wean  FAMILY  - Updates: No family bedside to update  - Inter-disciplinary family meet or Palliative Care meeting due by:  10/30/2017  The patient is critically ill with multiple organ systems failure and requires high complexity decision making for assessment and support, frequent evaluation and titration of therapies, application of advanced monitoring technologies and extensive interpretation of multiple databases.   Critical Care Time devoted to patient care services described in this note is  35  Minutes. This time reflects time of care of this signee Dr Koren Bound. This critical care time does not reflect procedure time, or teaching time or supervisory time of PA/NP/Med student/Med Resident etc but could involve care discussion time.  Alyson Reedy, M.D. [facility] Pulmonary/Critical Care Medicine. Pager: [phone].  After hours pager: [phone].

## 2017-10-25 ENCOUNTER — Inpatient Hospital Stay (HOSPITAL_COMMUNITY): Payer: PPO

## 2017-10-25 LAB — CULTURE, RESPIRATORY W GRAM STAIN

## 2017-10-25 LAB — CBC
HCT: 24.4 % — ABNORMAL LOW (ref 39.0–52.0)
Hemoglobin: 7.8 g/dL — ABNORMAL LOW (ref 13.0–17.0)
MCH: 31.7 pg (ref 26.0–34.0)
MCHC: 32 g/dL (ref 30.0–36.0)
MCV: 99.2 fL (ref 78.0–100.0)
Platelets: 255 10*3/uL (ref 150–400)
RBC: 2.46 MIL/uL — ABNORMAL LOW (ref 4.22–5.81)
RDW: 18.2 % — AB (ref 11.5–15.5)
WBC: 11.1 10*3/uL — AB (ref 4.0–10.5)

## 2017-10-25 LAB — GLUCOSE, CAPILLARY
GLUCOSE-CAPILLARY: 99 mg/dL (ref 65–99)
GLUCOSE-CAPILLARY: 151 mg/dL — AB (ref 65–99)
Glucose-Capillary: 102 mg/dL — ABNORMAL HIGH (ref 65–99)
Glucose-Capillary: 145 mg/dL — ABNORMAL HIGH (ref 65–99)
Glucose-Capillary: 179 mg/dL — ABNORMAL HIGH (ref 65–99)

## 2017-10-25 LAB — BASIC METABOLIC PANEL
ANION GAP: 10 (ref 5–15)
BUN: 67 mg/dL — ABNORMAL HIGH (ref 6–20)
CALCIUM: 8.2 mg/dL — AB (ref 8.9–10.3)
CO2: 22 mmol/L (ref 22–32)
CREATININE: 2.75 mg/dL — AB (ref 0.61–1.24)
Chloride: 106 mmol/L (ref 101–111)
GFR, EST AFRICAN AMERICAN: 24 mL/min — AB (ref >60)
GFR, EST NON AFRICAN AMERICAN: 21 mL/min — AB (ref >60)
Glucose, Bld: 106 mg/dL — ABNORMAL HIGH (ref 65–99)
Potassium: 3.2 mmol/L — ABNORMAL LOW (ref 3.5–5.1)
SODIUM: 138 mmol/L (ref 135–145)

## 2017-10-25 LAB — BLOOD GAS, ARTERIAL
Acid-base deficit: 3.2 mmol/L — ABNORMAL HIGH (ref 0.0–2.0)
BICARBONATE: 21.2 mmol/L (ref 20.0–28.0)
O2 CONTENT: 2 L/min
O2 Saturation: 98.2 %
PATIENT TEMPERATURE: 98.6
PH ART: 7.37 (ref 7.350–7.450)
PO2 ART: 103 mmHg (ref 83.0–108.0)
pCO2 arterial: 37.5 mmHg (ref 32.0–48.0)

## 2017-10-25 LAB — CULTURE, RESPIRATORY: CULTURE: NORMAL

## 2017-10-25 LAB — MAGNESIUM: MAGNESIUM: 2.2 mg/dL (ref 1.7–2.4)

## 2017-10-25 LAB — PROCALCITONIN: PROCALCITONIN: 0.91 ng/mL

## 2017-10-25 LAB — PHOSPHORUS: PHOSPHORUS: 3.3 mg/dL (ref 2.5–4.6)

## 2017-10-25 MED ORDER — CARVEDILOL 6.25 MG PO TABS
6.2500 mg | ORAL_TABLET | Freq: Two times a day (BID) | ORAL | Status: DC
  Administered 2017-10-25 – 2017-11-01 (×14): 6.25 mg via ORAL
  Filled 2017-10-25 (×15): qty 1

## 2017-10-25 MED ORDER — INSULIN ASPART 100 UNIT/ML ~~LOC~~ SOLN
0.0000 [IU] | Freq: Three times a day (TID) | SUBCUTANEOUS | Status: DC
  Administered 2017-10-26 (×2): 3 [IU] via SUBCUTANEOUS

## 2017-10-25 MED ORDER — LEVOTHYROXINE SODIUM 50 MCG PO TABS
50.0000 ug | ORAL_TABLET | Freq: Every day | ORAL | Status: DC
  Administered 2017-10-26 – 2017-11-06 (×13): 50 ug via ORAL
  Filled 2017-10-25 (×13): qty 1

## 2017-10-25 MED ORDER — CLOPIDOGREL BISULFATE 75 MG PO TABS
75.0000 mg | ORAL_TABLET | Freq: Every day | ORAL | Status: DC
  Administered 2017-10-26 – 2017-11-06 (×12): 75 mg via ORAL
  Filled 2017-10-25 (×12): qty 1

## 2017-10-25 MED ORDER — INSULIN ASPART 100 UNIT/ML ~~LOC~~ SOLN: 0.0000 [IU] | Freq: Every day | SUBCUTANEOUS | Status: DC

## 2017-10-25 MED ORDER — POTASSIUM CHLORIDE 10 MEQ/50ML IV SOLN
10.0000 meq | INTRAVENOUS | Status: AC
  Administered 2017-10-25 (×2): 10 meq via INTRAVENOUS
  Filled 2017-10-25 (×2): qty 50

## 2017-10-25 NOTE — Progress Notes (Signed)
Progress Note  Patient Name: [name] Date of Encounter: 10/25/2017  Primary Cardiologist: Dr. Johney Frame  Subjective   Extubated feels well   Inpatient Medications    Scheduled Meds: . chlorhexidine  15 mL Mouth Rinse BID  . Chlorhexidine Gluconate Cloth  6 each Topical Daily  . clopidogrel  75 mg Per Tube Daily  . collagenase   Topical Daily  . heparin  5,000 Units Subcutaneous Q8H  . insulin aspart  0-20 Units Subcutaneous Q4H  . insulin glargine  5 Units Subcutaneous BID  . levothyroxine  25 mcg Intravenous Daily  . mouth rinse  15 mL Mouth Rinse q12n4p  . metoprolol tartrate  5 mg Intravenous Q6H  . pantoprazole (PROTONIX) IV  40 mg Intravenous QHS  . sodium chloride flush  10-40 mL Intracatheter Q12H   Continuous Infusions: . sodium chloride 250 mL (10/25/17 0400)  . piperacillin-tazobactam (ZOSYN)  IV 3.375 g (10/25/17 0537)  . potassium chloride 10 mEq (10/25/17 0823)  . vancomycin Stopped (10/24/17 4734)   PRN Meds: sodium chloride, acetaminophen (TYLENOL) oral liquid 160 mg/5 mL, fentaNYL (SUBLIMAZE) injection, sodium chloride flush   Vital Signs    Vitals:   10/25/17 0600 10/25/17 0700 10/25/17 0800 10/25/17 0900  BP: (!) 99/57 (!) 76/61    Pulse: 90 95 88 92  Resp: 15 17 11 13   Temp: 98.6 F (37 C) 98.4 F (36.9 C) 98.4 F (36.9 C) 98.2 F (36.8 C)  TempSrc:      SpO2: 97% 98% 97% 99%  Weight:        Intake/Output Summary (Last 24 hours) at 10/25/2017 0914 Last data filed at 10/25/2017 0700 Gross per 24 hour  Intake 890 ml  Output 2500 ml  Net -1610 ml   Filed Weights   10/23/17 1058 10/24/17 0344 10/25/17 0500  Weight: 255 lb 4.7 oz (115.8 kg) 255 lb 11.7 oz (116 kg) 257 lb 0.9 oz (116.6 kg)    Telemetry    Afib with V pacing   ECG    10/23/17 Afib V pacing NSVT   Physical Exam   Affect appropriate Obese chronically ill white male  HEENT: normal Neck supple with no adenopathy JVP normal no bruits no thyromegaly Lungs  clear with no wheezing and good diaphragmatic motion Heart:  S1/S2 no murmur, no rub, gallop or click PMI normal AICD under left clavicle  Abdomen: benighn, BS positve, no tenderness, no AAA no bruit.  No HSM or HJR Distal pulses intact with no bruits No edema Neuro non-focal Skin warm and dry No muscular weakness    Labs    Chemistry Recent Labs  Lab 10/23/17 0523 10/24/17 0321 10/25/17 0452  NA 135 137 138  K 4.0 3.2* 3.2*  CL 100* 108 106  CO2 19* 19* 22  GLUCOSE 309* 158* 106*  BUN 64* 72* 67*  CREATININE 3.28* 2.91* 2.75*  CALCIUM 8.2* 7.8* 8.2*  PROT 6.3*  --   --   ALBUMIN 2.2*  --   --   AST 1,352*  --   --   ALT 543*  --   --   ALKPHOS 213*  --   --   BILITOT 1.0  --   --   GFRNONAA 17* 19* 21*  GFRAA 19* 22* 24*  ANIONGAP 16* 10 10     Hematology Recent Labs  Lab 10/23/17 0523 10/24/17 0321 10/25/17 0452  WBC 14.3* 13.1* 11.1*  RBC 2.82* 2.71* 2.46*  HGB 8.6* 8.4* 7.8*  HCT 27.2* 26.1* 24.4*  MCV 96.5 96.3 99.2  MCH 30.5 31.0 31.7  MCHC 31.6 32.2 32.0  RDW 17.0* 17.5* 18.2*  PLT 339 250 255    Cardiac Enzymes Recent Labs  Lab 10/23/17 1123 10/23/17 2249 10/24/17 0321 10/24/17 1123  TROPONINI 2.81* 3.42* 2.33* 1.32*   No results for input(s): TROPIPOC in the last 168 hours.   BNPNo results for input(s): BNP, PROBNP in the last 168 hours.   DDimer No results for input(s): DDIMER in the last 168 hours.   Radiology    Dg Chest Port 1 View  Result Date: 10/25/2017 CLINICAL DATA:  76 year old male status post septic shock, intubated. EXAM: PORTABLE CHEST 1 VIEW COMPARISON:  10/24/2017 and earlier. FINDINGS: Portable AP semi upright view at 0410 hours. Extubated. Enteric tube removed. Stable right IJ central line. Stable left chest cardiac AICD. Mildly improved lung volumes. Stable to mildly improved ventilation with continued blunting of both costophrenic angles. Stable pulmonary vascularity without overt edema. No pneumothorax. Paucity  of bowel gas in the upper abdomen. IMPRESSION: 1. Extubated and enteric tube removed. 2. Mildly improved ventilation with probable small pleural effusions and basilar atelectasis. Electronically Signed   By: Odessa Fleming M.D.   On: 10/25/2017 06:48   Dg Chest Port 1 View  Result Date: 10/24/2017 CLINICAL DATA:  Followup intubated patient. EXAM: PORTABLE CHEST 1 VIEW COMPARISON:  10/23/2017 FINDINGS: Lung volumes are low. There is hazy lung base opacity, left greater than right, likely combination of small effusions and atelectasis. No pulmonary edema. No pneumothorax. Endotracheal tube, nasal/ orogastric tube, right internal jugular central venous line and left anterior chest wall biventricular cardioverter-defibrillator are stable in well positioned. IMPRESSION: 1. No significant change from the most recent prior study allowing for lower lung volumes on the current exam. 2. Mild, left greater than right, lung base atelectasis with probable small effusions. 3. Support apparatus is well positioned. Electronically Signed   By: Amie Portland M.D.   On: 10/24/2017 07:43    Cardiac Studies   Echo EF 25-30% mild MR   Patient Profile     76 y.o. male admitted with altered mental status and respiratory failure Has AICD and PAF   Assessment & Plan    1.  Atrial fibrillation -his blood pressure is improved. Will continue digoxin and add low dose coreg for now  2.  Coronary artery disease  -his troponin is minimally elevated and coming down no chest pain no plans for ischemic evaluation  3.  Ventilatory dependent respiratory failure -Extubated CXR reviewed small effusion basilar atelectasis  4.  ICD -review of the telemetry strips demonstrates intermittent non-capture which is secondary to ventricular sensed response pacing along with intermittent capture with a relatively isoelectric QRS.  Overall normal ICD function.  For questions or updates, please contact [facility] Please consult [email or web address] for  contact info under Cardiology/STEMI.      Signed, Charlton Haws, MD  10/25/2017, 9:14 AM  Patient ID: [name], male   DOB: 1941-05-31, 76 y.o.   MRN: [id number]

## 2017-10-25 NOTE — Progress Notes (Signed)
eLink Physician-Brief Progress Note Patient Name: [name] DOB: 07-07-1941 MRN: [id number]   Date of Service  10/25/2017  HPI/Events of Note  Hyperglycemia - Request to change resistant Novolog SSI from Q 4 hours to AC/HS.  eICU Interventions  Will change resistant Novolog SSI to AC/HS.     Intervention Category Major Interventions: Hyperglycemia - active titration of insulin therapy  Lenell Antu 10/25/2017, 7:32 PM

## 2017-10-25 NOTE — Progress Notes (Signed)
eLink Physician-Brief Progress Note Patient Name: BRENNER VISCONTI DOB: 08/04/1941 MRN: [id number]   Date of Service  10/25/2017  HPI/Events of Note  K+ = 3.2 and Creatinine = 2.75.   eICU Interventions  Will cautiously replace K+.      Intervention Category Major Interventions: Electrolyte abnormality - evaluation and management  Komal Stangelo Eugene 10/25/2017, 6:27 AM

## 2017-10-25 NOTE — Progress Notes (Signed)
PULMONARY / CRITICAL CARE MEDICINE   Name: [name] MRN: [id number] DOB: 02/23/1941     ADMISSION DATE:  10/23/2017 CONSULTATION DATE:  10/23/2017  REFERRING MD:  [facility]    CHIEF COMPLAINT:  AMS  HISTORY OF PRESENT ILLNESS:   76 year old male with PMH of DM, CAD on plavix, PAF on coumadin, CKD stage 3, RA, ischemic cardiomyopathy s/p ICD  Recent Admission 11/12-11/30. Cellulitis to left foot. Stay complicated by GI bleed. Underwent L SFA/popliteral atherectomy on 10/12/2017.   Presented to OSH on 12/7 with confusion and AMS. Wife reports that since discharge to SNF patient has not been acting like self. However 12/7 she went to nursing home to feed patient lunch and noticed that he was more altered/lethargic then usual. CT Head negative. LA 1.8. U/A with few bacteria +yeast. Given Vancomycin and Levofloxacin. Intubated for airway protection. Transferred to [facility] for further workup.   SUBJECTIVE:  Extubated successfully norepi weaned to off   VITAL SIGNS: BP (!) 76/61   Pulse 92   Temp 98.2 F (36.8 C)   Resp 13   Wt 116.6 kg (257 lb 0.9 oz)   SpO2 99%   BMI 39.09 kg/m   HEMODYNAMICS:    VENTILATOR SETTINGS:    INTAKE / OUTPUT: I/O last 3 completed shifts: In: 4407.6 [P.O.:180; I.V.:2382.6; NG/GT:1020; IV Piggyback:825] Out: 3050 [Urine:3050]  PHYSICAL EXAMINATION: General: Obese man, awake, interacting, no distress Neuro: wake, oriented, answers questions and follows commands HEENT: Oropharynx clear, no lesions, Cardiovascular: Mostly paced, irregular, no murmur Lungs: Decreased bilateral breath sounds, no wheezing, no crackles Abdomen: Soft, nontender, positive bowel sounds Musculoskeletal: 1+ bilateral lower extremity edema Skin: Left foot diabetic ulcer  LABS:  BMET Recent Labs  Lab 10/23/17 0523 10/24/17 0321 10/25/17 0452  NA 135 137 138  K 4.0 3.2* 3.2*  CL 100* 108 106  CO2 19* 19* 22  BUN 64* 72* 67*  CREATININE 3.28* 2.91*  2.75*  GLUCOSE 309* 158* 106*   Electrolytes Recent Labs  Lab 10/23/17 0523 10/24/17 0321 10/25/17 0452  CALCIUM 8.2* 7.8* 8.2*  MG  --  1.7 2.2  PHOS  --  3.4 3.3   CBC Recent Labs  Lab 10/23/17 0523 10/24/17 0321 10/25/17 0452  WBC 14.3* 13.1* 11.1*  HGB 8.6* 8.4* 7.8*  HCT 27.2* 26.1* 24.4*  PLT 339 250 255   Coag's No results for input(s): APTT, INR in the last 168 hours.  Sepsis Markers Recent Labs  Lab 10/23/17 0523 10/23/17 0823 10/24/17 0321 10/25/17 0452  LATICACIDVEN 2.0* 1.7  --   --   PROCALCITON 0.31  --  1.09 0.91   ABG Recent Labs  Lab 10/23/17 0611 10/24/17 0345 10/25/17 0425  PHART 7.364 7.378 7.370  PCO2ART 37.2 34.5 37.5  PO2ART 543.0* 144* 103   Liver Enzymes Recent Labs  Lab 10/23/17 0523  AST 1,352*  ALT 543*  ALKPHOS 213*  BILITOT 1.0  ALBUMIN 2.2*   Cardiac Enzymes Recent Labs  Lab 10/23/17 2249 10/24/17 0321 10/24/17 1123  TROPONINI 3.42* 2.33* 1.32*   Glucose Recent Labs  Lab 10/24/17 1143 10/24/17 1458 10/24/17 1927 10/24/17 2322 10/25/17 0315 10/25/17 0814  GLUCAP 236* 216* 138* 92 102* 99   Imaging Dg Chest Port 1 View  Result Date: 10/25/2017 CLINICAL DATA:  76 year old male status post septic shock, intubated. EXAM: PORTABLE CHEST 1 VIEW COMPARISON:  10/24/2017 and earlier. FINDINGS: Portable AP semi upright view at 0410 hours. Extubated. Enteric tube removed. Stable right IJ  central line. Stable left chest cardiac AICD. Mildly improved lung volumes. Stable to mildly improved ventilation with continued blunting of both costophrenic angles. Stable pulmonary vascularity without overt edema. No pneumothorax. Paucity of bowel gas in the upper abdomen. IMPRESSION: 1. Extubated and enteric tube removed. 2. Mildly improved ventilation with probable small pleural effusions and basilar atelectasis. Electronically Signed   By: Odessa Fleming M.D.   On: 10/25/2017 06:48   STUDIES:  CT Head 12/7 > no acute, moderate to  severe parenchymal brain volume loss and moderate small vessel ischemic disease  CXR 12/7 > Cardiomegaly and mild interstitial edema, left basilar opacity likely due to atelectasis     CULTURES: Blood 12/8 >> Urine 12/8 >> Sputum 12/8 >> normal oral flora  ANTIBIOTICS: Vancomycin 12/7>>> Levofloxacin 12/7>>> Zosyn 12/8 >> >  SIGNIFICANT EVENTS: 12/7 > Presents to OSH   LINES/TUBES: ETT 12/7 >> 12/9 R IJ TLC 12/7>>>  DISCUSSION: 76 year old male presents to ED with AMS from SNF. Intubated for airway protection. Transferred to [facility] for further management.   ASSESSMENT / PLAN:  PULMONARY A: Respiratory Insufficieny in setting of metabolic encephalopathy  P:   Extubated 12/9 Push pulmonary hygiene  CARDIOVASCULAR A:  Septic Shock  H/O PAF, CAD, Ischemic Cardiomyopathy (EF 20-25) s/p ICD  P:  Pressors weaned to off Holding carvedilol and Demadex at this time Continue Plavix, home Coumadin is on hold in the setting of a recent GI bleed.  Plan has been in place to restart in the next few weeks Possibly DC central IV access in the next 1-2 days  RENAL A:   Anion Gap Metabolic Acidosis, resolved Acute on Chronic kidney disease, improving CKD stage 3 (Baseline crt 1.9-2) Urinary Rentention P:   Follow BMP, urine output Demadex currently on hold, likely restart 12/11 Replace electrolyte as indicated Ensure adequate renal perfusion  GASTROINTESTINAL A:   GERD  P:   Okay to start diet on 12/10 Continue acid prophylaxis  HEMATOLOGIC A:   Recent GI Bleed  P:  Follow CBC intermittently  INFECTIOUS A:   Suspected Urosepsis  -U/A > Small Bacteria +Hematuria +yeast, WBC 10.6  H/O LLE Cellulitis, Diabetic Left foot infection s/p left SFA and popliteal atherectomy and ballon angioplasty  P:   Broad-spectrum antibiotics with vancomycin, Zosyn.  Likely narrow 12/11 if no positive culture data Follow cultures Follow WBC and fever curve  ENDOCRINE A:    DM H/O Hypothyroidism    P:   Continue sliding scale insulin Continue Synthroid  NEUROLOGIC A:   Metabolic Encephalopathy  Ammonia 23 P:   RASS goal: 0 Minimize sedating medication  FAMILY  - Updates: No family bedside to update  - Inter-disciplinary family meet or Palliative Care meeting due by:  10/30/2017  Hopefully patient can move out of the ICU to a stepdown unit on 12/10  Levy Pupa, MD, PhD 10/25/2017, 9:56 AM [facility] [phone] or if no answer [phone]

## 2017-10-26 ENCOUNTER — Encounter (HOSPITAL_COMMUNITY): Payer: Self-pay | Admitting: General Practice

## 2017-10-26 ENCOUNTER — Other Ambulatory Visit: Payer: Self-pay

## 2017-10-26 DIAGNOSIS — J9600 Acute respiratory failure, unspecified whether with hypoxia or hypercapnia: Secondary | ICD-10-CM

## 2017-10-26 DIAGNOSIS — M79672 Pain in left foot: Secondary | ICD-10-CM

## 2017-10-26 DIAGNOSIS — I739 Peripheral vascular disease, unspecified: Secondary | ICD-10-CM

## 2017-10-26 LAB — HEPATIC FUNCTION PANEL
ALK PHOS: 195 U/L — AB (ref 38–126)
ALT: 345 U/L — AB (ref 17–63)
AST: 363 U/L — ABNORMAL HIGH (ref 15–41)
Albumin: 2 g/dL — ABNORMAL LOW (ref 3.5–5.0)
BILIRUBIN INDIRECT: 0.4 mg/dL (ref 0.3–0.9)
BILIRUBIN TOTAL: 0.6 mg/dL (ref 0.3–1.2)
Bilirubin, Direct: 0.2 mg/dL (ref 0.1–0.5)
TOTAL PROTEIN: 6 g/dL — AB (ref 6.5–8.1)

## 2017-10-26 LAB — GLUCOSE, CAPILLARY
GLUCOSE-CAPILLARY: 189 mg/dL — AB (ref 65–99)
Glucose-Capillary: 130 mg/dL — ABNORMAL HIGH (ref 65–99)
Glucose-Capillary: 129 mg/dL — ABNORMAL HIGH (ref 65–99)
Glucose-Capillary: 125 mg/dL — ABNORMAL HIGH (ref 65–99)

## 2017-10-26 MED ORDER — INSULIN ASPART 100 UNIT/ML ~~LOC~~ SOLN
0.0000 [IU] | Freq: Three times a day (TID) | SUBCUTANEOUS | Status: DC
  Administered 2017-10-26 – 2017-10-27 (×2): 2 [IU] via SUBCUTANEOUS
  Administered 2017-10-27: 3 [IU] via SUBCUTANEOUS
  Administered 2017-10-27: 2 [IU] via SUBCUTANEOUS
  Administered 2017-10-28 – 2017-10-29 (×3): 3 [IU] via SUBCUTANEOUS
  Administered 2017-10-30: 2 [IU] via SUBCUTANEOUS
  Administered 2017-10-30 (×2): 3 [IU] via SUBCUTANEOUS
  Administered 2017-10-31 – 2017-11-02 (×4): 2 [IU] via SUBCUTANEOUS
  Administered 2017-11-03: 3 [IU] via SUBCUTANEOUS
  Administered 2017-11-03: 2 [IU] via SUBCUTANEOUS
  Administered 2017-11-04: 3 [IU] via SUBCUTANEOUS
  Administered 2017-11-05 – 2017-11-06 (×4): 2 [IU] via SUBCUTANEOUS

## 2017-10-26 MED ORDER — INSULIN ASPART 100 UNIT/ML ~~LOC~~ SOLN: 0.0000 [IU] | Freq: Every day | SUBCUTANEOUS | Status: DC

## 2017-10-26 MED ORDER — LEVOTHYROXINE SODIUM 50 MCG PO TABS: 50.0000 ug | ORAL_TABLET | Freq: Every day | ORAL | Status: DC

## 2017-10-26 MED ORDER — CIPROFLOXACIN HCL 500 MG PO TABS
750.0000 mg | ORAL_TABLET | Freq: Every day | ORAL | Status: AC
  Administered 2017-10-26 – 2017-10-29 (×4): 750 mg via ORAL
  Filled 2017-10-26 (×3): qty 2
  Filled 2017-10-26: qty 1

## 2017-10-26 MED ORDER — PANTOPRAZOLE SODIUM 40 MG PO TBEC
40.0000 mg | DELAYED_RELEASE_TABLET | Freq: Every day | ORAL | Status: DC
  Administered 2017-10-26 – 2017-11-05 (×11): 40 mg via ORAL
  Filled 2017-10-26 (×11): qty 1

## 2017-10-26 NOTE — Progress Notes (Signed)
Patients had run of rapid afib into 150s while working with therapy. Therapy got him to sit on side of bed. Patients heart rate went right back down to 90s-100s once laying back in bed. Will continue to monitor.

## 2017-10-26 NOTE — Evaluation (Signed)
Physical Therapy Evaluation Patient Details Name: ATHARV BARRIERE MRN: [id number] DOB: 11/02/1941 Today's Date: 10/26/2017   History of Present Illness  76 diabetic, CAD, CKD stage III with ischemic cardiomyopathy status post AICD admitted 12/7 confusion, cellulitis left lower extremity and lactate of 1.8, required mechanical ventilation briefly until he was extubated 12/9, pressors weaned to off. Last admission complicated by GI bleed. Has been at rehab since that time but does not want to go back there.   Clinical Impression  Pt admitted with above diagnosis. Pt currently with functional limitations due to the deficits listed below (see PT Problem List). Pt very limited with mobility today. Max A to sit EOB and pt became dizzy for first 2 mins. Performed seated there ex, HR up to 150's. Returned to 90's once supine, pt asymptomatic. Pt prefers to not return to SNF but understands that he currently needs a higher level care than he can receive at home.  Pt will benefit from skilled PT to increase their independence and safety with mobility to allow discharge to the venue listed below.       Follow Up Recommendations SNF;Supervision/Assistance - 24 hour    Equipment Recommendations  None recommended by PT    Recommendations for Other Services       Precautions / Restrictions Precautions Precautions: Fall Restrictions Weight Bearing Restrictions: No LLE Weight Bearing: Partial weight bearing LLE Partial Weight Bearing Percentage or Pounds: heel only      Mobility  Bed Mobility Overal bed mobility: Needs Assistance Bed Mobility: Supine to Sit;Sit to Supine     Supine to sit: Max assist Sit to supine: Mod assist   General bed mobility comments: Assist to move legs off bed, elevate trunk into sitting and bring hips to EOB. Assist with LE for return to supine, +2 tot A to scoot up in bed  Transfers                 General transfer comment: pt dizzy in sitting and HR up to  150bpm today, unable to attempt to stand  Ambulation/Gait             General Gait Details: unable at this point  Stairs            Wheelchair Mobility    Modified Rankin (Stroke Patients Only)       Balance Overall balance assessment: Needs assistance Sitting-balance support: Feet supported;Bilateral upper extremity supported Sitting balance-Leahy Scale: Fair Sitting balance - Comments: supervision EOB. Dizzy with initial sitting but improved after 2 mins. Performed seated LE ther ex. HR up to 150 bpm, pt asymptomatic                                     Pertinent Vitals/Pain Pain Assessment: Faces Faces Pain Scale: Hurts even more Pain Location: L foot and R knee and foot due to OA Pain Descriptors / Indicators: Grimacing;Guarding Pain Intervention(s): Limited activity within patient's tolerance;Monitored during session    Home Living Family/patient expects to be discharged to:: Private residence Living Arrangements: Spouse/significant other Available Help at Discharge: Family Type of Home: House Home Access: Stairs to enter Entrance Stairs-Rails: None Entrance Stairs-Number of Steps: 2 Home Layout: One level Home Equipment: Environmental consultant - 2 wheels;Transport chair;Shower seat;Cane - single point      Prior Function Level of Independence: Needs assistance   Gait / Transfers Assistance Needed: was independent prior to admission  in November but has needed assistance since that time. Was working with therapy at 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, sells "big trucks"     Higher education careers adviser        Extremity/Trunk Assessment   Upper Extremity Assessment Upper Extremity Assessment: Generalized weakness    Lower Extremity Assessment Lower Extremity Assessment: Generalized weakness;RLE deficits/detail;LLE  deficits/detail RLE Deficits / Details: R knee flexion <90 deg and joint swollen RLE Sensation: decreased light touch LLE Deficits / Details: left knee less sore than R but also has limited knee flexion LLE Sensation: decreased light touch    Cervical / Trunk Assessment Cervical / Trunk Assessment: Kyphotic  Communication   Communication: No difficulties  Cognition Arousal/Alertness: Awake/alert Behavior During Therapy: WFL for tasks assessed/performed Overall Cognitive Status: Within Functional Limits for tasks assessed                                        General Comments General comments (skin integrity, edema, etc.): pt does not want to go back to rehab but understands that it is going to take time to get strength back    Exercises General Exercises - Lower Extremity Ankle Circles/Pumps: AROM;Both;10 reps;Supine Long Arc Quad: AROM;Both;10 reps;Seated Heel Slides: AAROM;Both;10 reps;Supine   Assessment/Plan    PT Assessment Patient needs continued PT services  PT Problem List Decreased strength;Decreased range of motion;Decreased activity tolerance;Decreased balance;Decreased mobility;Pain;Cardiopulmonary status limiting activity       PT Treatment Interventions DME instruction;Gait training;Functional mobility training;Therapeutic activities;Therapeutic exercise;Balance training;Neuromuscular re-education;Patient/family education    PT Goals (Current goals can be found in the Care Plan section)  Acute Rehab PT Goals Patient Stated Goal: get better PT Goal Formulation: With patient Time For Goal Achievement: 11/09/17 Potential to Achieve Goals: Fair    Frequency Min 2X/week   Barriers to discharge        Co-evaluation               AM-PAC PT "6 Clicks" Daily Activity  Outcome Measure Difficulty turning over in bed (including adjusting bedclothes, sheets and blankets)?: Unable Difficulty moving from lying on back to sitting on the side  of the bed? : Unable Difficulty sitting down on and standing up from a chair with arms (e.g., wheelchair, bedside commode, etc,.)?: Unable Help needed moving to and from a bed to chair (including a wheelchair)?: Total Help needed walking in hospital room?: Total Help needed climbing 3-5 steps with a railing? : Total 6 Click Score: 6    End of Session   Activity Tolerance: Treatment limited secondary to medical complications (Comment)(dizziness) Patient left: in bed;with call bell/phone within reach;with bed alarm set Nurse Communication: Mobility status PT Visit Diagnosis: Muscle weakness (generalized) (M62.81);Difficulty in walking, not elsewhere classified (R26.2);Pain Pain - Right/Left: Left Pain - part of body: Ankle and joints of foot    Time: 5035-4656 PT Time Calculation (min) (ACUTE ONLY): 18 min   Charges:   PT Evaluation $PT Eval Moderate Complexity: 1 Mod     PT G Codes:        Lyanne Co, PT  Acute Rehab Services  [phone]   [place] L Shauntea Lok 10/26/2017, 4:18 PM

## 2017-10-26 NOTE — Consult Note (Signed)
   [facility] CM Inpatient Consult   10/26/2017  [name] 22-Dec-1940 [id number]  Patient screened for potential [facility] Care Management services for re-admission follow up. Patient is in the ACO of the [facility] Care Management services under patient's [employer] plan. Patient currently in patient care.  Will follow up at a more convenient time.    Please place a [facility] Care Management consult or for questions contact:   Charlesetta Shanks, RN BSN CCM [facility]  [phone] business mobile phone Toll free office [phone]

## 2017-10-26 NOTE — Consult Note (Signed)
Patient name: ULICES MAACK MRN: [id number] DOB: 1941-06-24 Sex: male  REASON FOR VISIT:   To evaluate the left foot  HPI:   KALEE MCCLENATHAN is a pleasant 76 y.o. male, who was seen by Dr. Myra Gianotti on 10/12/2017.  The patient had an infected left foot with evidence of peripheral vascular disease.  He underwent an arteriogram on 10/14/2017.  This was done with CO2 because of his renal insufficiency.  He had atherectomy and drug-coated balloon angioplasty of the left superficial femoral artery and popliteal artery.  There was single vessel runoff via the posterior tibial artery on the left.  A total of 19 cc of contrast was used.  He initially developed a wound on his foot after stepping on a tack I believe.  He was admitted 3 days ago with a urinary tract infection.  We were asked to evaluate his left foot.  He denies significant pain in the left foot.  He denies rest pain.  He denies any drainage from the wound on his left foot.  Current Facility-Administered Medications  Medication Dose Route Frequency Provider Last Rate Last Dose  . 0.9 %  sodium chloride infusion  250 mL Intravenous PRN Tobey Grim, NP 10 mL/hr at 10/26/17 0400 250 mL at 10/26/17 0400  . acetaminophen (TYLENOL) solution 650 mg  650 mg Oral Q6H PRN Tobey Grim, NP   650 mg at 10/23/17 2024  . carvedilol (COREG) tablet 6.25 mg  6.25 mg Oral BID WC Wendall Stade, MD   6.25 mg at 10/26/17 0844  . ciprofloxacin (CIPRO) tablet 750 mg  750 mg Oral Q breakfast Cyril Mourning V, MD      . clopidogrel (PLAVIX) tablet 75 mg  75 mg Oral Daily Leslye Peer, MD   75 mg at 10/26/17 1014  . collagenase (SANTYL) ointment   Topical Daily Sunny Schlein, MD      . heparin injection 5,000 Units  5,000 Units Subcutaneous Q8H Tobey Grim, NP   5,000 Units at 10/26/17 0549  . insulin aspart (novoLOG) injection 0-20 Units  0-20 Units Subcutaneous TID WC Karl Ito, MD   3 Units at 10/26/17 1141  . insulin aspart  (novoLOG) injection 0-5 Units  0-5 Units Subcutaneous QHS Karl Ito, MD      . insulin glargine (LANTUS) injection 5 Units  5 Units Subcutaneous BID Alyson Reedy, MD   5 Units at 10/26/17 1014  . levothyroxine (SYNTHROID, LEVOTHROID) tablet 50 mcg  50 mcg Oral QAC breakfast Leslye Peer, MD   50 mcg at 10/26/17 0844  . pantoprazole (PROTONIX) EC tablet 40 mg  40 mg Oral QHS Quenton Fetter, RPH      . vancomycin (VANCOCIN) 1,250 mg in sodium chloride 0.9 % 250 mL IVPB  1,250 mg Intravenous Q48H Quenton Fetter, RPH   Stopped at 10/26/17 4235    REVIEW OF SYSTEMS:  [X]  denotes positive finding, [ ]  denotes negative finding Cardiac  Comments:  Chest pain or chest pressure:    Shortness of breath upon exertion:    Short of breath when lying flat:    Irregular heart rhythm:    Constitutional    Fever or chills:     PHYSICAL EXAM:   Vitals:   10/26/17 0900 10/26/17 1000 10/26/17 1100 10/26/17 1200  BP: 99/73 (!) 107/56 112/70 114/85  Pulse: (!) 106 88 96 (!) 101  Resp: 19 16 16 15   Temp: 99.1 F (37.3  C) 99.1 F (37.3 C) 99 F (37.2 C) 99.1 F (37.3 C)  TempSrc:    Core (Comment)  SpO2: 96% 95% 96% 95%  Weight:      Height:        GENERAL: The patient is a well-nourished male, in no acute distress. The vital signs are documented above. CARDIOVASCULAR: There is a regular rate and rhythm. PULMONARY: There is good air exchange bilaterally without wheezing or rales. VASCULAR: On the left side, which is the site of concern, he has a palpable femoral pulse.  He has a brisk posterior tibial signal with the Doppler and also a monophasic dorsalis pedis signal. On the right side he has a palpable femoral pulse with good Doppler signals in the right foot. He has an area of ischemic skin on the plantar aspect of his foot near the forefoot.  There is no significant erythema or drainage.  DATA:   No new data.  MEDICAL ISSUES:   STATUS POST ENDOVASCULAR REVASCULARIZATION  LEFT LOWER EXTREMITY: The patient has undergone successful endovascular revascularization of the left lower extremity by Dr. Myra Gianotti.  He had single-vessel runoff in the left via the posterior tibial artery and has a nice strong posterior tibial signal with the Doppler.  The wound on the foot will take some time to declare itself and I will arrange for him to follow-up with Dr. Myra Gianotti in the office after his discharge.  This wound can be followed as an outpatient.  Vascular surgery will be available as needed.  Waverly Ferrari [facility] [phone]

## 2017-10-26 NOTE — Progress Notes (Signed)
Progress Note  Patient Name: [name] Date of Encounter: 10/26/2017  Primary Cardiologist: Dr. McDowell/Dr. Johney Frame  Subjective   No chest pain or SOB, worried about his foot.  Inpatient Medications    Scheduled Meds: . carvedilol  6.25 mg Oral BID WC  . clopidogrel  75 mg Oral Daily  . collagenase   Topical Daily  . heparin  5,000 Units Subcutaneous Q8H  . insulin aspart  0-20 Units Subcutaneous TID WC  . insulin aspart  0-5 Units Subcutaneous QHS  . insulin glargine  5 Units Subcutaneous BID  . levothyroxine  50 mcg Oral QAC breakfast  . pantoprazole (PROTONIX) IV  40 mg Intravenous QHS   Continuous Infusions: . sodium chloride 250 mL (10/26/17 0400)  . piperacillin-tazobactam (ZOSYN)  IV 3.375 g (10/26/17 0600)  . vancomycin Stopped (10/26/17 0748)   PRN Meds: sodium chloride, acetaminophen (TYLENOL) oral liquid 160 mg/5 mL   Vital Signs    Vitals:   10/26/17 0500 10/26/17 0600 10/26/17 0700 10/26/17 0800  BP: 105/66 (!) 93/58 123/69 121/67  Pulse: (!) 117 (!) 104 (!) 114 92  Resp: 19 (!) 21 20 17   Temp: 98.4 F (36.9 C) 99.3 F (37.4 C) 99.1 F (37.3 C) 99.1 F (37.3 C)  TempSrc:    Core  SpO2: 95% 93% 94% 91%  Weight: 257 lb 4.4 oz (116.7 kg)     Height:        Intake/Output Summary (Last 24 hours) at 10/26/2017 0857 Last data filed at 10/26/2017 0800 Gross per 24 hour  Intake 1289.5 ml  Output 1345 ml  Net -55.5 ml   Filed Weights   10/25/17 0500 10/25/17 2000 10/26/17 0500  Weight: 257 lb 0.9 oz (116.6 kg) 257 lb 0.9 oz (116.6 kg) 257 lb 4.4 oz (116.7 kg)    Telemetry    V pacing and a fib with RVR at times - Personally Reviewed  ECG    No new - Personally Reviewed  Physical Exam   GEN: No acute distress.   Neck: No JVD Cardiac: irreg irreg, no murmurs, rubs, or gallops.  Respiratory: Clear to auscultation bilaterally. GI: Soft, nontender, non-distended  MS: No edema; No deformity. Lt foot with dressing  Neuro:  Nonfocal    Psych: Normal affect   Labs    Chemistry Recent Labs  Lab 10/23/17 0523 10/24/17 0321 10/25/17 0452  NA 135 137 138  K 4.0 3.2* 3.2*  CL 100* 108 106  CO2 19* 19* 22  GLUCOSE 309* 158* 106*  BUN 64* 72* 67*  CREATININE 3.28* 2.91* 2.75*  CALCIUM 8.2* 7.8* 8.2*  PROT 6.3*  --   --   ALBUMIN 2.2*  --   --   AST 1,352*  --   --   ALT 543*  --   --   ALKPHOS 213*  --   --   BILITOT 1.0  --   --   GFRNONAA 17* 19* 21*  GFRAA 19* 22* 24*  ANIONGAP 16* 10 10     Hematology Recent Labs  Lab 10/23/17 0523 10/24/17 0321 10/25/17 0452  WBC 14.3* 13.1* 11.1*  RBC 2.82* 2.71* 2.46*  HGB 8.6* 8.4* 7.8*  HCT 27.2* 26.1* 24.4*  MCV 96.5 96.3 99.2  MCH 30.5 31.0 31.7  MCHC 31.6 32.2 32.0  RDW 17.0* 17.5* 18.2*  PLT 339 250 255    Cardiac Enzymes Recent Labs  Lab 10/23/17 1123 10/23/17 2249 10/24/17 0321 10/24/17 1123  TROPONINI 2.81* 3.42* 2.33*  1.32*   No results for input(s): TROPIPOC in the last 168 hours.   BNPNo results for input(s): BNP, PROBNP in the last 168 hours.   DDimer No results for input(s): DDIMER in the last 168 hours.   Radiology    Dg Chest Port 1 View  Result Date: 10/25/2017 CLINICAL DATA:  76 year old male status post septic shock, intubated. EXAM: PORTABLE CHEST 1 VIEW COMPARISON:  10/24/2017 and earlier. FINDINGS: Portable AP semi upright view at 0410 hours. Extubated. Enteric tube removed. Stable right IJ central line. Stable left chest cardiac AICD. Mildly improved lung volumes. Stable to mildly improved ventilation with continued blunting of both costophrenic angles. Stable pulmonary vascularity without overt edema. No pneumothorax. Paucity of bowel gas in the upper abdomen. IMPRESSION: 1. Extubated and enteric tube removed. 2. Mildly improved ventilation with probable small pleural effusions and basilar atelectasis. Electronically Signed   By: Odessa Fleming M.D.   On: 10/25/2017 06:48    Cardiac Studies   10/13/17  Echo Study  Conclusions  - Left ventricle: Diffuse hypokinesis worse in inferior wall and   abnormal septal motion The cavity size was moderately dilated.   Wall thickness was increased in a pattern of severe LVH. Systolic   function was severely reduced. The estimated ejection fraction   was in the range of 25% to 30%. Doppler parameters are consistent   with abnormal left ventricular relaxation (grade 1 diastolic   dysfunction). - Mitral valve: Calcified annulus. Mildly thickened leaflets .   There was mild regurgitation. - Atrial septum: No defect or patent foramen ovale was identified.  Patient Profile     76 y.o. male admitted with altered mental status and respiratory failure Has BiV AICD and PAF     Assessment & Plan    1.  Atrial fibrillation -his blood pressure is improved. Will continue digoxin and add low dose coreg for now still with RVR at times 2.  Coronary artery disease  -his troponin is minimally elevated and coming down no chest pain no plans for ischemic evaluation no chest pain. 3.  Ventilatory dependent respiratory failure -Extubated CXR reviewed small effusion basilar atelectasis  4.  ICD -review of the telemetry strips demonstrates intermittent non-capture which is secondary to ventricular sensed response pacing along with intermittent capture with a relatively isoelectric QRS.  Overall normal ICD function. Was interrogated 10/23/17 by Dr. Ladona Ridgel.  Adjustments made.      For questions or updates, please contact [facility] Please consult [email or web address] for contact info under Cardiology/STEMI.      Signed, Nada Boozer, NP  10/26/2017, 8:57 AM    Patient examined chart reviewed. Obese white male atelectasis at base. No murmur rate control Better with PO coreg. Troponin flat and coming down. No chest pain ECG LBBB lest venticular Ectopy will need to add back diuretic as Cr improves.   Charlton Haws

## 2017-10-26 NOTE — Progress Notes (Signed)
PULMONARY / CRITICAL CARE MEDICINE   Name: [name] MRN: [id number] DOB: 07-14-1941     ADMISSION DATE:  10/23/2017 CONSULTATION DATE:  10/23/2017  REFERRING MD:  [facility]    CHIEF COMPLAINT:  AMS  HISTORY OF PRESENT ILLNESS:   76 year old male with PMH of DM, CAD on plavix, PAF on coumadin, CKD stage 3, RA, ischemic cardiomyopathy s/p ICD  Recent Admission 11/12-11/30. Cellulitis to left foot. Stay complicated by GI bleed. Underwent L SFA/popliteral atherectomy on 10/12/2017.   Presented to OSH on 12/7 with confusion and AMS. Wife reports that since discharge to SNF patient has not been acting like self. However 12/7 she went to nursing home to feed patient lunch and noticed that he was more altered/lethargic then usual. CT Head negative. LA 1.8. U/A with few bacteria +yeast. Given Vancomycin and Levofloxacin. Intubated for airway protection. Transferred to [facility] for further workup.   SUBJECTIVE:  Awake alert no acute distress   VITAL SIGNS: BP 99/73   Pulse (!) 106   Temp 99.1 F (37.3 C)   Resp 19   Ht 5\' 8"  (1.727 m)   Wt 257 lb 4.4 oz (116.7 kg)   SpO2 96%   BMI 39.12 kg/m   HEMODYNAMICS:    VENTILATOR SETTINGS:    INTAKE / OUTPUT: I/O last 3 completed shifts: In: 1634.5 [P.O.:1000; I.V.:280; IV Piggyback:354.5] Out: 2265 [Urine:2265]  PHYSICAL EXAMINATION: General: Obese white male no acute distress HEENT: No JVD PSY: Dull effect Neuro: Follows commands moves all extremities x4 CV: Heart sounds are distant regular rate intermittently paced PULM: No increased work of breathing, decreased breath sounds at base 2266, non-tender, bsx4 active  Extremities: warm/dry, 1+ edema, left foot dressing dry and intact Skin: no rashes or lesions   LABS:  BMET Recent Labs  Lab 10/23/17 0523 10/24/17 0321 10/25/17 0452  NA 135 137 138  K 4.0 3.2* 3.2*  CL 100* 108 106  CO2 19* 19* 22  BUN 64* 72* 67*  CREATININE 3.28* 2.91* 2.75*  GLUCOSE  309* 158* 106*   Electrolytes Recent Labs  Lab 10/23/17 0523 10/24/17 0321 10/25/17 0452  CALCIUM 8.2* 7.8* 8.2*  MG  --  1.7 2.2  PHOS  --  3.4 3.3   CBC Recent Labs  Lab 10/23/17 0523 10/24/17 0321 10/25/17 0452  WBC 14.3* 13.1* 11.1*  HGB 8.6* 8.4* 7.8*  HCT 27.2* 26.1* 24.4*  PLT 339 250 255   Coag's No results for input(s): APTT, INR in the last 168 hours.  Sepsis Markers Recent Labs  Lab 10/23/17 0523 10/23/17 0823 10/24/17 0321 10/25/17 0452  LATICACIDVEN 2.0* 1.7  --   --   PROCALCITON 0.31  --  1.09 0.91   ABG Recent Labs  Lab 10/23/17 0611 10/24/17 0345 10/25/17 0425  PHART 7.364 7.378 7.370  PCO2ART 37.2 34.5 37.5  PO2ART 543.0* 144* 103   Liver Enzymes Recent Labs  Lab 10/23/17 0523  AST 1,352*  ALT 543*  ALKPHOS 213*  BILITOT 1.0  ALBUMIN 2.2*   Cardiac Enzymes Recent Labs  Lab 10/23/17 2249 10/24/17 0321 10/24/17 1123  TROPONINI 3.42* 2.33* 1.32*   Glucose Recent Labs  Lab 10/25/17 0315 10/25/17 0814 10/25/17 1235 10/25/17 1721 10/25/17 2220 10/26/17 0728  GLUCAP 102* 99 145* 179* 151* 130*   Imaging No results found. STUDIES:  CT Head 12/7 > no acute, moderate to severe parenchymal brain volume loss and moderate small vessel ischemic disease  CXR 12/7 > Cardiomegaly and mild  interstitial edema, left basilar opacity likely due to atelectasis     CULTURES: Blood 12/8 >> Urine 12/8 >> negative Sputum 12/8 >> normal oral flora  ANTIBIOTICS: Vancomycin 12/7>>> Levofloxacin 12/7>>> Zosyn 12/8 >> >  SIGNIFICANT EVENTS: 12/7 > Presents to OSH   LINES/TUBES: ETT 12/7 >> 12/9 R IJ TLC 12/7>>>  DISCUSSION: 76 year old male presents to ED with AMS from SNF. Intubated for airway protection. Transferred to [facility] for further management. He is off pressors hemodynamically stable and ready to be transferred to stepdown unit.  ASSESSMENT / PLAN:  PULMONARY A: Respiratory Insufficieny in setting of metabolic  encephalopathy  P:   Extubated 12/9 Push pulmonary hygiene  CARDIOVASCULAR A:  Septic Shock resolved 10/26/2017 H/O PAF, CAD, Ischemic Cardiomyopathy (EF 20-25) s/p ICD  P:  Pressors weaned to off Holding carvedilol and Demadex at this time Continue Plavix, home Coumadin is on hold in the setting of a recent GI bleed.  Plan has been in place to restart in the next few weeks Possibly DC central IV access in the next 1-2 days  RENAL Lab Results  Component Value Date   CREATININE 2.75 (H) 10/25/2017   CREATININE 2.91 (H) 10/24/2017   CREATININE 3.28 (H) 10/23/2017   Recent Labs  Lab 10/23/17 0523 10/24/17 0321 10/25/17 0452  K 4.0 3.2* 3.2*      A:   Anion Gap Metabolic Acidosis, resolved Acute on Chronic kidney disease, improving CKD stage 3 (Baseline crt 1.9-2) Urinary Rentention P:   Follow BMP, urine output Demadex currently on hold, likely restart 12/12 Replace electrolyte as indicated Ensure adequate renal perfusion  GASTROINTESTINAL A:   GERD  P:   Okay to start diet on 12/10 Continue acid prophylaxis  HEMATOLOGIC Recent Labs    10/24/17 0321 10/25/17 0452  HGB 8.4* 7.8*    A:   Recent GI Bleed  P:  Follow CBC intermittently Transfuse per protocol  INFECTIOUS A:   Suspected Urosepsis negative urine culture -U/A > Small Bacteria +Hematuria +yeast, WBC 10.6  H/O LLE Cellulitis, Diabetic Left foot infection s/p left SFA and popliteal atherectomy and ballon angioplasty  P:   Broad-spectrum antibiotics with vancomycin, Zosyn.  Likely narrow once culture data has matured. Follow cultures Follow WBC and fever curve  ENDOCRINE CBG (last 3)  Recent Labs    10/25/17 1721 10/25/17 2220 10/26/17 0728  GLUCAP 179* 151* 130*    A:   DM H/O Hypothyroidism    P:   Continue sliding scale insulin Continue Synthroid  NEUROLOGIC A:   Metabolic Encephalopathy improved 10/26/2017  P:   RASS goal: 0 Minimize sedating medication  FAMILY   - Updates: No family bedside to update  - Inter-disciplinary family meet or Palliative Care meeting due by:  10/30/2017  -Transfer to stepdown unit 10/26/2017 into [facility] as 10/27/2017  Brett Canales Hazen Brumett ACNP [facility] PCCM Pager [phone] till 1 pm If no answer page [phone] 10/26/2017, 9:13 AM

## 2017-10-27 ENCOUNTER — Inpatient Hospital Stay (HOSPITAL_COMMUNITY): Payer: PPO

## 2017-10-27 DIAGNOSIS — I5043 Acute on chronic combined systolic (congestive) and diastolic (congestive) heart failure: Secondary | ICD-10-CM

## 2017-10-27 DIAGNOSIS — Z9289 Personal history of other medical treatment: Secondary | ICD-10-CM

## 2017-10-27 DIAGNOSIS — N179 Acute kidney failure, unspecified: Secondary | ICD-10-CM

## 2017-10-27 DIAGNOSIS — Z95810 Presence of automatic (implantable) cardiac defibrillator: Secondary | ICD-10-CM

## 2017-10-27 LAB — COMPREHENSIVE METABOLIC PANEL
ALBUMIN: 2 g/dL — AB (ref 3.5–5.0)
ALT: 295 U/L — AB (ref 17–63)
AST: 231 U/L — AB (ref 15–41)
Alkaline Phosphatase: 198 U/L — ABNORMAL HIGH (ref 38–126)
Anion gap: 10 (ref 5–15)
BUN: 52 mg/dL — AB (ref 6–20)
CHLORIDE: 104 mmol/L (ref 101–111)
CO2: 20 mmol/L — AB (ref 22–32)
CREATININE: 2.42 mg/dL — AB (ref 0.61–1.24)
Calcium: 8.5 mg/dL — ABNORMAL LOW (ref 8.9–10.3)
GFR calc Af Amer: 28 mL/min — ABNORMAL LOW (ref >60)
GFR, EST NON AFRICAN AMERICAN: 24 mL/min — AB (ref >60)
GLUCOSE: 163 mg/dL — AB (ref 65–99)
Potassium: 3.8 mmol/L (ref 3.5–5.1)
Sodium: 134 mmol/L — ABNORMAL LOW (ref 135–145)
Total Bilirubin: 0.7 mg/dL (ref 0.3–1.2)
Total Protein: 5.6 g/dL — ABNORMAL LOW (ref 6.5–8.1)

## 2017-10-27 LAB — MAGNESIUM: MAGNESIUM: 2 mg/dL (ref 1.7–2.4)

## 2017-10-27 LAB — PHOSPHORUS: Phosphorus: 3.3 mg/dL (ref 2.5–4.6)

## 2017-10-27 LAB — GLUCOSE, CAPILLARY
GLUCOSE-CAPILLARY: 150 mg/dL — AB (ref 65–99)
GLUCOSE-CAPILLARY: 121 mg/dL — AB (ref 65–99)
Glucose-Capillary: 166 mg/dL — ABNORMAL HIGH (ref 65–99)
Glucose-Capillary: 145 mg/dL — ABNORMAL HIGH (ref 65–99)

## 2017-10-27 MED ORDER — CARVEDILOL 6.25 MG PO TABS: 6.2500 mg | ORAL_TABLET | Freq: Two times a day (BID) | ORAL | Status: DC

## 2017-10-27 MED ORDER — POTASSIUM CHLORIDE CRYS ER 20 MEQ PO TBCR
40.0000 meq | EXTENDED_RELEASE_TABLET | Freq: Once | ORAL | Status: AC
  Administered 2017-10-27: 40 meq via ORAL
  Filled 2017-10-27: qty 2

## 2017-10-27 MED ORDER — ISOSORBIDE DINITRATE 10 MG PO TABS
10.0000 mg | ORAL_TABLET | Freq: Three times a day (TID) | ORAL | Status: DC
  Administered 2017-10-27 – 2017-11-06 (×31): 10 mg via ORAL
  Filled 2017-10-27 (×32): qty 1

## 2017-10-27 MED ORDER — PREMIER PROTEIN SHAKE
11.0000 [oz_av] | Freq: Two times a day (BID) | ORAL | Status: DC
  Administered 2017-10-28 – 2017-11-05 (×13): 11 [oz_av] via ORAL
  Filled 2017-10-27 (×26): qty 325.31

## 2017-10-27 MED ORDER — VANCOMYCIN HCL 10 G IV SOLR
1500.0000 mg | INTRAVENOUS | Status: AC
  Administered 2017-10-28 – 2017-10-30 (×2): 1500 mg via INTRAVENOUS
  Filled 2017-10-27 (×2): qty 1500

## 2017-10-27 NOTE — Progress Notes (Addendum)
PROGRESS NOTE  [name] XNT:[id number] DOB: 09-Mar-1941 DOA: 10/23/2017 PCP: [name], MD  HPI/Recap of past 24 hours: 76 diabetic, CAD, CKD3, ischemic cardiomyopathy status post AICD, PAF on coumadin, RA who was admitted on 10/23/17 with altered mental status, intubated for airway protection and transferred to [facility] for further workup. Coumadin held due to recent UGI bleed (09/2017).  No reported acute events overnight. POD#1 post left foot endovascular revascularization. Pt seen and examined at his bedside. He reports feeling weak. Denies any chest pain, dyspnea or palpitations.   Assessment/Plan: Active Problems:   Respiratory failure (HCC)   Sepsis (HCC)   Septic shock (HCC)   Acute cystitis without hematuria   Atrial fibrillation with rapid ventricular response (HCC)   Metabolic encephalopathy  AMS, resolved -most likely 2/2 to sepsis  Recent septic shock 2/2 to UTI vs LLE cellulitis -off pressors 10/26/17 -transferred to medicine team 10/27/17 -on IV vancomycin and po cipro day #1/5  Ischemic CM with EF 25-30% (10/13/17) -cardiology following -appreciate recommendations -carvedilol, plavix, isordil -allergic to statin  Elevated troponin from demand ischemia -troponin trended down 1.32 peaked at 2.32 -cardiology following  Acute on Chronic HFREF 25-30% -management as stated above -allergic to statin  Severe PVD post POD#1 post left foot endovascular revascularization.  -vascular surgery following -we appreciate recs -plavix -reported allergy to statin  AKI on CKD 3 -improving -cr 2.42 from 2.75 -baseline cr 1.7 -avoid nephrotoxic agents/hypotension  Mild hyponatremia -Na+ 134 -could be hemodilutional -BMP am  Hypothyroidism -synthroid  Recent GI bleed -Consult GI for recommendation on restarting oral anticoagulation -Hg stable -no reported acute GI bleed  Deconditioning -PT to evaluate and treat -PT recommends SNF    Code  Status: Full  Family Communication: No family member at bedside  Disposition Plan: will stay another midnight to continue current management   Consultants:  Cardiology  Vascular duergery  Procedures:  POD #1 post left foot endovascular revascularization.  Antimicrobials:  IV vancomycin  Po cipro day #1/5  DVT prophylaxis:  sq heparin 5000 U TID   Objective: Vitals:   10/26/17 1400 10/26/17 1526 10/26/17 2153 10/27/17 0619  BP: 115/75 (!) 95/50 117/74 135/84  Pulse: 98 (!) 108 97 99  Resp:  18 18 17   Temp: 99.1 F (37.3 C) 97.6 F (36.4 C) (!) 97.5 F (36.4 C) 98.1 F (36.7 C)  TempSrc: Oral Oral Oral Oral  SpO2: 95% 98% 96% 99%  Weight:    118 kg (260 lb 2.3 oz)  Height:        Intake/Output Summary (Last 24 hours) at 10/27/2017 0759 Last data filed at 10/27/2017 0245 Gross per 24 hour  Intake 30 ml  Output 672 ml  Net -642 ml   Filed Weights   10/25/17 2000 10/26/17 0500 10/27/17 0619  Weight: 116.6 kg (257 lb 0.9 oz) 116.7 kg (257 lb 4.4 oz) 118 kg (260 lb 2.3 oz)    Exam:   General:  76 CM WD WN NAD   Cardiovascular: RRR no rubs or gallops  Respiratory: CTA no wheezes or rhonchi  Abdomen: obese ND NT NBS x4 quadrants  Musculoskeletal: Escar tissue noted at plantar region of left foot below toes. 1/4 left dorsalis pedis pulse   Skin: as noted above  Psychiatry: mood is appropriate for condition and setting   Data Reviewed: CBC: Recent Labs  Lab 10/23/17 0523 10/24/17 0321 10/25/17 0452  WBC 14.3* 13.1* 11.1*  HGB 8.6* 8.4* 7.8*  HCT 27.2* 26.1* 24.4*  MCV 96.5 96.3 99.2  PLT 339 250 255   Basic Metabolic Panel: Recent Labs  Lab 10/23/17 0523 10/24/17 0321 10/25/17 0452  NA 135 137 138  K 4.0 3.2* 3.2*  CL 100* 108 106  CO2 19* 19* 22  GLUCOSE 309* 158* 106*  BUN 64* 72* 67*  CREATININE 3.28* 2.91* 2.75*  CALCIUM 8.2* 7.8* 8.2*  MG  --  1.7 2.2  PHOS  --  3.4 3.3   GFR: Estimated Creatinine Clearance: 28.1  mL/min (A) (by C-G formula based on SCr of 2.75 mg/dL (H)). Liver Function Tests: Recent Labs  Lab 10/23/17 0523 10/26/17 1140  AST 1,352* 363*  ALT 543* 345*  ALKPHOS 213* 195*  BILITOT 1.0 0.6  PROT 6.3* 6.0*  ALBUMIN 2.2* 2.0*   No results for input(s): LIPASE, AMYLASE in the last 168 hours. No results for input(s): AMMONIA in the last 168 hours. Coagulation Profile: No results for input(s): INR, PROTIME in the last 168 hours. Cardiac Enzymes: Recent Labs  Lab 10/23/17 0523 10/23/17 1123 10/23/17 2249 10/24/17 0321 10/24/17 1123  TROPONINI 0.42* 2.81* 3.42* 2.33* 1.32*   BNP (last 3 results) No results for input(s): PROBNP in the last 8760 hours. HbA1C: No results for input(s): HGBA1C in the last 72 hours. CBG: Recent Labs  Lab 10/25/17 2220 10/26/17 0728 10/26/17 1118 10/26/17 1722 10/26/17 2151  GLUCAP 151* 130* 129* 125* 189*   Lipid Profile: No results for input(s): CHOL, HDL, LDLCALC, TRIG, CHOLHDL, LDLDIRECT in the last 72 hours. Thyroid Function Tests: No results for input(s): TSH, T4TOTAL, FREET4, T3FREE, THYROIDAB in the last 72 hours. Anemia Panel: No results for input(s): VITAMINB12, FOLATE, FERRITIN, TIBC, IRON, RETICCTPCT in the last 72 hours. Urine analysis:    Component Value Date/Time   COLORURINE AMBER (A) 10/12/2017 0450   APPEARANCEUR CLOUDY (A) 10/12/2017 0450   LABSPEC 1.024 10/12/2017 0450   PHURINE 5.0 10/12/2017 0450   GLUCOSEU NEGATIVE 10/12/2017 0450   HGBUR LARGE (A) 10/12/2017 0450   BILIRUBINUR NEGATIVE 10/12/2017 0450   KETONESUR 5 (A) 10/12/2017 0450   PROTEINUR 100 (A) 10/12/2017 0450   UROBILINOGEN 0.2 07/05/2014 2117   NITRITE NEGATIVE 10/12/2017 0450   LEUKOCYTESUR TRACE (A) 10/12/2017 0450   Sepsis Labs: @LABRCNTIP (procalcitonin:4,lacticidven:4)  ) Recent Results (from the past 240 hour(s))  Culture, blood (routine x 2)     Status: None (Preliminary result)   Collection Time: 10/23/17  6:12 AM  Result Value  Ref Range Status   Specimen Description BLOOD LEFT HAND  Final   Special Requests IN PEDIATRIC BOTTLE Blood Culture adequate volume  Final   Culture NO GROWTH 3 DAYS  Final   Report Status PENDING  Incomplete  Culture, blood (routine x 2)     Status: None (Preliminary result)   Collection Time: 10/23/17  6:41 AM  Result Value Ref Range Status   Specimen Description BLOOD LEFT THUMB  Final   Special Requests IN PEDIATRIC BOTTLE Blood Culture adequate volume  Final   Culture NO GROWTH 3 DAYS  Final   Report Status PENDING  Incomplete  MRSA PCR Screening     Status: None   Collection Time: 10/23/17  7:00 AM  Result Value Ref Range Status   MRSA by PCR NEGATIVE NEGATIVE Final    Comment:        The GeneXpert MRSA Assay (FDA approved for NASAL specimens only), is one component of a comprehensive MRSA colonization surveillance program. It is not intended to diagnose MRSA infection nor to  guide or monitor treatment for MRSA infections.   Culture, respiratory (NON-Expectorated)     Status: None   Collection Time: 10/23/17  9:00 AM  Result Value Ref Range Status   Specimen Description TRACHEAL ASPIRATE  Final   Special Requests NONE  Final   Gram Stain   Final    RARE WBC PRESENT, PREDOMINANTLY PMN FEW GRAM POSITIVE COCCI IN PAIRS RARE GRAM NEGATIVE RODS RARE GRAM NEGATIVE COCCOBACILLI    Culture Consistent with normal respiratory flora.  Final   Report Status 10/25/2017 FINAL  Final  Urine Culture     Status: None   Collection Time: 10/23/17  5:35 PM  Result Value Ref Range Status   Specimen Description URINE, RANDOM  Final   Special Requests NONE  Final   Culture NO GROWTH  Final   Report Status 10/24/2017 FINAL  Final      Studies: No results found.  Scheduled Meds: . carvedilol  6.25 mg Oral BID WC  . ciprofloxacin  750 mg Oral Q breakfast  . clopidogrel  75 mg Oral Daily  . collagenase   Topical Daily  . heparin  5,000 Units Subcutaneous Q8H  . insulin aspart   0-15 Units Subcutaneous TID WC  . insulin aspart  0-5 Units Subcutaneous QHS  . insulin glargine  5 Units Subcutaneous BID  . levothyroxine  50 mcg Oral QAC breakfast  . pantoprazole  40 mg Oral QHS    Continuous Infusions: . sodium chloride Stopped (10/26/17 1346)  . vancomycin Stopped (10/26/17 0748)     LOS: 4 days     Darlin Drop, MD [facility] Pager [phone]  If 7PM-7AM, please contact night-coverage [email or web address] Password [email or web address] 10/27/2017, 7:59 AM

## 2017-10-27 NOTE — Progress Notes (Signed)
CSW received consult regarding PT recommendation of SNF at discharge.  Patient is refusing SNF. He states he would like to return home at discharge. His wife is there. He has had home health years ago and liked it. CSW stressed that we would need time to have his insurance authorize a SNF stay if he changes his mind. He states he does not like the facilities in [place].  CSW signing off.   Osborne Casco Kirsti Mcalpine LCSWA [phone]

## 2017-10-27 NOTE — Progress Notes (Signed)
Pharmacy Antibiotic Note  [name] is a 76 y.o. male admitted on 10/23/2017 with AMS/sepsis/LLE cellulitis.  Admitted 11/12-11/30 at [facility] for LLE wound infection.  Pharmacy has been consulted for vancomycin dosing, also on ciprofloxacin for DFI. Renal function improved a bit, SCr down to 2.42.     Plan: Increase vancomycin to 1500 mg IV q48h Stop date in place Monitor renal function and clinical progress   Height: 5\' 8"  (172.7 cm) Weight: 260 lb 2.3 oz (118 kg) IBW/kg (Calculated) : 68.4   Temp (24hrs), Avg:98.1 F (36.7 C), Min:97.5 F (36.4 C), Max:99.1 F (37.3 C)  Recent Labs  Lab 10/23/17 0523 10/23/17 0823 10/24/17 0321 10/25/17 0452 10/27/17 1035  WBC 14.3*  --  13.1* 11.1*  --   CREATININE 3.28*  --  2.91* 2.75* 2.42*  LATICACIDVEN 2.0* 1.7  --   --   --     Estimated Creatinine Clearance: 31.9 mL/min (A) (by C-G formula based on SCr of 2.42 mg/dL (H)).    Allergies  Allergen Reactions  . Allegra [Fexofenadine] Itching  . Crestor [Rosuvastatin Calcium] Other (See Comments)    Body aches  . Codeine Other (See Comments)    blisters, but able to take hydrocodone    14/12/18, PharmD, BCPS Clinical Pharmacist Phone for today [phone] Main pharmacy - [phone] 10/27/2017 1:36 PM

## 2017-10-27 NOTE — Progress Notes (Signed)
Progress Note  Patient Name: [name] Date of Encounter: 10/27/2017  Primary Cardiologist: Nona Dell, MD  EP  Dr. Johney Frame  Subjective   Very weak has been OOB but not walking on his own No chest pain dyspnea or palpitations   Inpatient Medications    Scheduled Meds: . carvedilol  6.25 mg Oral BID WC  . ciprofloxacin  750 mg Oral Q breakfast  . clopidogrel  75 mg Oral Daily  . collagenase   Topical Daily  . heparin  5,000 Units Subcutaneous Q8H  . insulin aspart  0-15 Units Subcutaneous TID WC  . insulin aspart  0-5 Units Subcutaneous QHS  . insulin glargine  5 Units Subcutaneous BID  . levothyroxine  50 mcg Oral QAC breakfast  . pantoprazole  40 mg Oral QHS  . potassium chloride  40 mEq Oral Once   Continuous Infusions: . sodium chloride Stopped (10/26/17 1346)  . vancomycin Stopped (10/26/17 0748)   PRN Meds: sodium chloride, acetaminophen (TYLENOL) oral liquid 160 mg/5 mL   Vital Signs    Vitals:   10/26/17 1400 10/26/17 1526 10/26/17 2153 10/27/17 0619  BP: 115/75 (!) 95/50 117/74 135/84  Pulse: 98 (!) 108 97 99  Resp:  18 18 17   Temp: 99.1 F (37.3 C) 97.6 F (36.4 C) (!) 97.5 F (36.4 C) 98.1 F (36.7 C)  TempSrc: Oral Oral Oral Oral  SpO2: 95% 98% 96% 99%  Weight:    260 lb 2.3 oz (118 kg)  Height:        Intake/Output Summary (Last 24 hours) at 10/27/2017 0844 Last data filed at 10/27/2017 0245 Gross per 24 hour  Intake 30 ml  Output 567 ml  Net -537 ml   Filed Weights   10/25/17 2000 10/26/17 0500 10/27/17 0619  Weight: 257 lb 0.9 oz (116.6 kg) 257 lb 4.4 oz (116.7 kg) 260 lb 2.3 oz (118 kg)    Telemetry    AV pacing intermit ant PAF - Personally Reviewed  ECG    AV pacing PAF LBBB   Physical Exam  Obese white male  GEN: No acute distress.    Neck: No JVD Cardiac: RRR, no murmurs, rubs, or gallops. AICD under left clavicle  Respiratory: Clear to auscultation bilaterally. GI: Soft, nontender, non-distended  MS: No  edema; No deformity. Neuro:  Nonfocal  Psych: Normal affect  Left foot wrapped with doppler able PT pulse per VVS   Labs    Chemistry Recent Labs  Lab 10/23/17 0523 10/24/17 0321 10/25/17 0452 10/26/17 1140  NA 135 137 138  --   K 4.0 3.2* 3.2*  --   CL 100* 108 106  --   CO2 19* 19* 22  --   GLUCOSE 309* 158* 106*  --   BUN 64* 72* 67*  --   CREATININE 3.28* 2.91* 2.75*  --   CALCIUM 8.2* 7.8* 8.2*  --   PROT 6.3*  --   --  6.0*  ALBUMIN 2.2*  --   --  2.0*  AST 1,352*  --   --  363*  ALT 543*  --   --  345*  ALKPHOS 213*  --   --  195*  BILITOT 1.0  --   --  0.6  GFRNONAA 17* 19* 21*  --   GFRAA 19* 22* 24*  --   ANIONGAP 16* 10 10  --      Hematology Recent Labs  Lab 10/23/17 14/08/18 10/24/17 0321 10/25/17 14/10/18  WBC 14.3* 13.1* 11.1*  RBC 2.82* 2.71* 2.46*  HGB 8.6* 8.4* 7.8*  HCT 27.2* 26.1* 24.4*  MCV 96.5 96.3 99.2  MCH 30.5 31.0 31.7  MCHC 31.6 32.2 32.0  RDW 17.0* 17.5* 18.2*  PLT 339 250 255    Cardiac Enzymes Recent Labs  Lab 10/23/17 1123 10/23/17 2249 10/24/17 0321 10/24/17 1123  TROPONINI 2.81* 3.42* 2.33* 1.32*   No results for input(s): TROPIPOC in the last 168 hours.   BNPNo results for input(s): BNP, PROBNP in the last 168 hours.   DDimer No results for input(s): DDIMER in the last 168 hours.   Radiology    Dg Chest Port 1 View  Result Date: 10/27/2017 CLINICAL DATA:  Shortness of breath, respiratory failure, CHF, coronary artery disease, sepsis. EXAM: PORTABLE CHEST 1 VIEW COMPARISON:  Portable chest x-ray of October 25, 2017 FINDINGS: The lungs are adequately inflated. The interstitial markings are increased though stable. The cardiac silhouette is enlarged. The pulmonary vascularity is mildly engorged. There is calcification in the wall of the aortic arch. The ICD is in stable position. The trachea is midline. IMPRESSION: CHF with mild interstitial edema. No alveolar pneumonia nor pleural effusion. Thoracic aortic atherosclerosis.  Electronically Signed   By: David  [place] M.D.   On: 10/27/2017 07:59    Cardiac Studies   ECHO 10/13/17 Study Conclusions  - Left ventricle: Diffuse hypokinesis worse in inferior wall and   abnormal septal motion The cavity size was moderately dilated.   Wall thickness was increased in a pattern of severe LVH. Systolic   function was severely reduced. The estimated ejection fraction   was in the range of 25% to 30%. Doppler parameters are consistent   with abnormal left ventricular relaxation (grade 1 diastolic   dysfunction). - Mitral valve: Calcified annulus. Mildly thickened leaflets .   There was mild regurgitation. - Atrial septum: No defect or patent foramen ovale was identified.   Patient Profile     76 y.o. male admitted with altered mental status and respiratory failureHas BiV AICD and PAF, elevated troponin PAF.     Assessment & Plan    Atrial fib - with RVR at times  CAD with elevated troponin form demand ischemia no chest pain, no plans for ischemic eval unless he becomes symptomatic on plavix and beta blocker   Respiratory failure extubated and CXR today with CHF mild interstitial edema no PNA, no pl. Effusion.  + thoracic aortic atherosclerosis  Continue antibiotics    CHF + 2966 since admit and wt up 5 lbs.  Cr 2.75 yesterday K+ 3.2 yesterday   Impedence Was high on AICD in unit will add back some of his home torsemide dose and follow Cr  Closely   ICD Normal function per GT  PAF:  On beta blocker resume coumadin when ok with GI no active bleeding   PVD:  Seen by VVS thinks graft open 1 vessel run off slow to heal but stable   Suspect he will need inpatient rehab  Charlton Haws

## 2017-10-27 NOTE — Progress Notes (Signed)
Physical Therapy Treatment Patient Details Name: [name] MRN: [id number] DOB: 11-Aug-1941 Today's Date: 10/27/2017    History of Present Illness Pt is a 76 y.o. male with PMH significant for DM, CAD, CKD stage III, ischemic cardiomyopathy s/p AICD, admitted 10/22/17 with confusion, LLE cellulitis, and lactate of 1.8. Required mechanical ventilation briefly until extubation 12/9; pressors weaned to off. Last admission complicated by GI bleed. Has been at [facility] rehab since that time but does not want to go back there.    PT Comments    Pt requires modA for bed mobility and standing using RW. Able to take steps from bed to chair with RW and minA for balance; significant difficulty maintaining LLE heel only PWB. Increased time spent on education regarding recommendation for continued SNF-level therapies at d/c prior to returning home; pt states he is willing to reconsider, but still does not seem to quite understand that he will not be able to stay in the hospital until he is safe to return home despite max education. Will continue to recommend SNF for d/c due to pt's weakness and family not being able to provide the amount of assist he requires for mobility.    Follow Up Recommendations  SNF;Supervision/Assistance - 24 hour     Equipment Recommendations  None recommended by PT    Recommendations for Other Services       Precautions / Restrictions Precautions Precautions: Fall Restrictions Weight Bearing Restrictions: Yes LLE Weight Bearing: Partial weight bearing LLE Partial Weight Bearing Percentage or Pounds: No MD WB orders provided. Per PT evaluation note, PWB "heel only"    Mobility  Bed Mobility Overal bed mobility: Needs Assistance Bed Mobility: Supine to Sit     Supine to sit: Max assist;HOB elevated     General bed mobility comments: MaxA to for UE support to assist trunk elevation; pt able to scoot BLEs to EOB.  Transfers Overall transfer level: Needs  assistance Equipment used: Rolling walker (2 wheeled) Transfers: Sit to/from Stand Sit to Stand: From elevated surface;Mod assist         General transfer comment: Able to stand from raised bed height with modA to steady RW as pt relying on momentum and BUEs to pull into standing despite cues for safe hand placement  Ambulation/Gait Ambulation/Gait assistance: Min assist Ambulation Distance (Feet): 2 Feet Assistive device: Rolling walker (2 wheeled) Gait Pattern/deviations: Shuffle;Decreased weight shift to left;Antalgic Gait velocity: Decreased   General Gait Details: Took steps from bed to chair with RW and minA for balance; cues for navigation of RW. Pt with significant difficulty maintaining heel only PWB on LLE   Stairs            Wheelchair Mobility    Modified Rankin (Stroke Patients Only)       Balance Overall balance assessment: Needs assistance Sitting-balance support: Feet supported;Bilateral upper extremity supported Sitting balance-Leahy Scale: Fair Sitting balance - Comments: Supervision for sitting EOB. C/o initial lightheadedness in sitting which subsided with rest    Standing balance support: Bilateral upper extremity supported Standing balance-Leahy Scale: Poor Standing balance comment: Reliant on BUE and minA for static standing; unable to maintain heel only PWB on LLE                            Cognition Arousal/Alertness: Awake/alert Behavior During Therapy: WFL for tasks assessed/performed Overall Cognitive Status: Impaired/Different from baseline Area of Impairment: Safety/judgement  Safety/Judgement: Decreased awareness of deficits            Exercises      General Comments General comments (skin integrity, edema, etc.): BP 120/63 sitting, 115/79 post-transfer; lightheadedness subsided with seated rest      Pertinent Vitals/Pain Pain Assessment: Faces Faces Pain Scale: Hurts a little  bit Pain Location: L foot; bilateral knees due to increased fluid Pain Descriptors / Indicators: Sore;Discomfort Pain Intervention(s): Limited activity within patient's tolerance;Monitored during session    Home Living                      Prior Function            PT Goals (current goals can now be found in the care plan section) Acute Rehab PT Goals Patient Stated Goal: get better PT Goal Formulation: With patient Time For Goal Achievement: 11/09/17 Potential to Achieve Goals: Fair Progress towards PT goals: Progressing toward goals    Frequency    Min 2X/week      PT Plan Current plan remains appropriate    Co-evaluation              AM-PAC PT "6 Clicks" Daily Activity  Outcome Measure  Difficulty turning over in bed (including adjusting bedclothes, sheets and blankets)?: Unable Difficulty moving from lying on back to sitting on the side of the bed? : Unable Difficulty sitting down on and standing up from a chair with arms (e.g., wheelchair, bedside commode, etc,.)?: Unable Help needed moving to and from a bed to chair (including a wheelchair)?: A Little Help needed walking in hospital room?: A Lot Help needed climbing 3-5 steps with a railing? : Total 6 Click Score: 9    End of Session Equipment Utilized During Treatment: Gait belt Activity Tolerance: Patient tolerated treatment well;Patient limited by fatigue Patient left: in chair;with call bell/phone within reach Nurse Communication: Mobility status PT Visit Diagnosis: Muscle weakness (generalized) (M62.81);Difficulty in walking, not elsewhere classified (R26.2);Pain Pain - Right/Left: Left Pain - part of body: Ankle and joints of foot     Time: 5597-4163 PT Time Calculation (min) (ACUTE ONLY): 35 min  Charges:  $Therapeutic Activity: 23-37 mins                    G Codes:      Ina Homes, PT, DPT Acute Rehab Services  Pager: [phone]  Malachy Chamber 10/27/2017, 4:46  PM

## 2017-10-28 ENCOUNTER — Inpatient Hospital Stay (HOSPITAL_COMMUNITY): Payer: PPO

## 2017-10-28 DIAGNOSIS — I96 Gangrene, not elsewhere classified: Secondary | ICD-10-CM

## 2017-10-28 DIAGNOSIS — N5089 Other specified disorders of the male genital organs: Secondary | ICD-10-CM

## 2017-10-28 LAB — GLUCOSE, CAPILLARY
GLUCOSE-CAPILLARY: 143 mg/dL — AB (ref 65–99)
GLUCOSE-CAPILLARY: 162 mg/dL — AB (ref 65–99)
Glucose-Capillary: 119 mg/dL — ABNORMAL HIGH (ref 65–99)
Glucose-Capillary: 185 mg/dL — ABNORMAL HIGH (ref 65–99)

## 2017-10-28 LAB — CULTURE, BLOOD (ROUTINE X 2)
Culture: NO GROWTH
Culture: NO GROWTH
SPECIAL REQUESTS: ADEQUATE
Special Requests: ADEQUATE

## 2017-10-28 LAB — CBC
HCT: 29.2 % — ABNORMAL LOW (ref 39.0–52.0)
Hemoglobin: 9.1 g/dL — ABNORMAL LOW (ref 13.0–17.0)
MCH: 31.1 pg (ref 26.0–34.0)
MCHC: 31.2 g/dL (ref 30.0–36.0)
MCV: 99.7 fL (ref 78.0–100.0)
PLATELETS: 290 10*3/uL (ref 150–400)
RBC: 2.93 MIL/uL — ABNORMAL LOW (ref 4.22–5.81)
RDW: 19.3 % — ABNORMAL HIGH (ref 11.5–15.5)
WBC: 8.8 10*3/uL (ref 4.0–10.5)

## 2017-10-28 LAB — BASIC METABOLIC PANEL
ANION GAP: 13 (ref 5–15)
BUN: 48 mg/dL — ABNORMAL HIGH (ref 6–20)
CALCIUM: 8.8 mg/dL — AB (ref 8.9–10.3)
CHLORIDE: 104 mmol/L (ref 101–111)
CO2: 18 mmol/L — AB (ref 22–32)
Creatinine, Ser: 2.29 mg/dL — ABNORMAL HIGH (ref 0.61–1.24)
GFR calc non Af Amer: 26 mL/min — ABNORMAL LOW (ref >60)
GFR, EST AFRICAN AMERICAN: 30 mL/min — AB (ref >60)
Glucose, Bld: 114 mg/dL — ABNORMAL HIGH (ref 65–99)
Potassium: 3.9 mmol/L (ref 3.5–5.1)
SODIUM: 135 mmol/L (ref 135–145)

## 2017-10-28 MED ORDER — COLLAGENASE 250 UNIT/GM EX OINT: TOPICAL_OINTMENT | Freq: Every day | CUTANEOUS | Status: DC

## 2017-10-28 NOTE — Consult Note (Signed)
Urology Consult   Physician requesting consult: [name]  Reason for consult: Scrotal edema  History of Present Illness: [name] is a 76 y.o. morbidly obese male who was admitted on 10/23/17 for confusion and AMS. He was just discharged from [facility] on 10/15/17 for treatment of left foot cellulitis.  He is s/p revascularization by Dr. Myra Gianotti on 10/12/17.  PMH is significant for CKD III, RA, PAF on coumadin, ischemic cardiomyopathy s/p ICD, CAD on Plavix, and DM.    Urology consult was requested due to scrotal edema.  Pt states that he has had trouble urinating since his foley was removed 2-3 days ago (it was placed during his previous hospital admission).  He states he can void but cannot "control" it due to retracted penis and scrotal edema. He is not certain whether or not he empties with each void.  Prior to cath placement he denies issues with voiding despite the fact that he has has scrotal enlargement for "years" due to edema that waxes/wanes and bilateral fat containing inguinal hernias. He has his prostate and PSA checked yearly by his PCP and has been told they are normal.    Per IM note a UA collected elsewhere was positive for a few bacteria and yeast.  Urine cultures on 10/12/17 and 10/23/17 were both negative. Pt has received Vanc, Levoquin, and Cipro.  CT A/P 2014 showed bilateral fat containing inguinal hernias.   PVR today reveals .  He is currently resting comfortably and denies F/C, HA, CP, SOB, N/V, and diarrhea/constipation.    Past Medical History:  Diagnosis Date  . AICD (automatic cardioverter/defibrillator) present   . Anemia   . Anemia-chronic    a. Pt reports h/o anemia - was offered IV iron in past by nephrologist but declined and has taken PO instead.  . Aneurysm, cerebral   . Arthritis    bilateral wrist and fingers  . BPH (benign prostatic hypertrophy)   . Cardiomyopathy, ischemic    LVEF 25-35%.  . Chronic kidney disease, stage III (moderate)  (HCC)    Followed by Dr. Fausto Skillern.  . Coronary atherosclerosis of native coronary artery    a. DES to RCA 03/2009. b. s/p PTCA to RCA for ISR, 05/2010. c. 03/2013 NSTEMI 2/2 severe prox RCA s/p PTCA/DES, med rx for residual dz.  . Cystitis 10/2017  . Essential hypertension, benign   . Hyperlipidemia   . Hypothyroidism   . LBBB (left bundle branch block)    s/p BiV ICD Implanted by Dr Graciela Husbands  . Morbid obesity (HCC)   . MRSA (methicillin resistant Staphylococcus aureus)   . Myocardial infarction (HCC)   . Paroxysmal atrial fibrillation (HCC)    a. Coumadin discontinue in 2010 when the patient required ASA/Plavix for stenting. b. Coumadin restarted 05/2013, Plavix continued, ASA stopped.   . Pneumonia    years ago 52  . Pulmonary nodule    CXR, 03/2013, [facility] - not described on any subsequent chest x-rays however, could be a vascular structure  . TIA (transient ischemic attack)    Multiple TIAs  . Type 2 diabetes mellitus (HCC)     Past Surgical History:  Procedure Laterality Date  . BIV ICD GENERATOR CHANGEOUT N/A 03/18/2017   Medtronic [place] MRI conditional CRT D device implated by Dr Johney Frame  . CHOLECYSTECTOMY    . ESOPHAGOGASTRODUODENOSCOPY N/A 10/06/2017   Procedure: ESOPHAGOGASTRODUODENOSCOPY (EGD);  Surgeon: Kerin Salen, MD;  Location: [facility];  Service: Gastroenterology;  Laterality: N/A;  . ESOPHAGOGASTRODUODENOSCOPY N/A 10/07/2017  Procedure: ESOPHAGOGASTRODUODENOSCOPY (EGD);  Surgeon: Kathi Der, MD;  Location: [facility];  Service: Gastroenterology;  Laterality: N/A;  . ESOPHAGOGASTRODUODENOSCOPY (EGD) WITH PROPOFOL N/A 10/05/2017   Procedure: ESOPHAGOGASTRODUODENOSCOPY (EGD) WITH PROPOFOL;  Surgeon: West Bali, MD;  Location: [facility];  Service: Endoscopy;  Laterality: N/A;  has ICD, just FYI  . ICD placement  05/06/2011   Medtronic CRT-D  . LEFT HEART CATHETERIZATION WITH CORONARY ANGIOGRAM N/A 04/17/2013   Procedure: LEFT HEART CATHETERIZATION WITH  CORONARY ANGIOGRAM;  Surgeon: Vesta Mixer, MD;  Location: [facility] CATH LAB;  Service: Cardiovascular;  Laterality: N/A;  . LOWER EXTREMITY ANGIOGRAPHY Left 10/12/2017   Procedure: Lower Extremity Angiography;  Surgeon: Nada Libman, MD;  Location: [facility] INVASIVE CV LAB;  Service: Cardiovascular;  Laterality: Left;  . LUNG SURGERY     24 - 25 yrs. old  . PERIPHERAL VASCULAR ATHERECTOMY Left 10/12/2017   Procedure: PERIPHERAL VASCULAR ATHERECTOMY;  Surgeon: Nada Libman, MD;  Location: [facility] INVASIVE CV LAB;  Service: Cardiovascular;  Laterality: Left;  SFA/POPLITEAL  . TOTAL KNEE ARTHROPLASTY    . VASECTOMY      Current Hospital Medications:  Home Meds:  Current Meds  Medication Sig  . acetaminophen (TYLENOL 8 HOUR ARTHRITIS PAIN) 650 MG CR tablet Take 1 tablet by mouth daily as needed for pain.  . carvedilol (COREG) 12.5 MG tablet TAKE 1 AND 1/2 TABLETS BY MOUTH TWICE DAILY WITH MEALS (DUE FOR FOLLOW UP) (Patient taking differently: 12.5mg  by mouth twice daily)  . Cholecalciferol (VITAMIN D3) 10000 units TABS Take 1,000 Units by mouth daily.  . clopidogrel (PLAVIX) 75 MG tablet Take 1 tablet (75 mg total) by mouth daily.  . hydrALAZINE (APRESOLINE) 25 MG tablet Take 25 mg by mouth 3 (three) times daily.  Marland Kitchen HYDROcodone-acetaminophen (NORCO) 7.5-325 MG tablet Take 1 tablet by mouth every 6 (six) hours as needed for moderate pain.  Marland Kitchen insulin lispro protamine-lispro (HUMALOG 75/25 MIX) (75-25) 100 UNIT/ML SUSP injection Inject 10 Units into the skin 2 (two) times daily with a meal. Take 10units BID with meals (Patient taking differently: Inject 10 Units into the skin 2 (two) times daily with a meal. )  . isosorbide mononitrate (IMDUR) 30 MG 24 hr tablet Take 30 mg by mouth daily.  Marland Kitchen l-methylfolate-B6-B12 (METANX) 3-35-2 MG TABS tablet Take 1 tablet by mouth daily.  Marland Kitchen levothyroxine (SYNTHROID, LEVOTHROID) 50 MCG tablet Take 50 mcg by mouth daily.  Marland Kitchen lisinopril (PRINIVIL,ZESTRIL) 2.5 MG tablet  Take 2.5 mg by mouth daily.  . nitroGLYCERIN (NITROSTAT) 0.4 MG SL tablet Place 0.4 mg under the tongue every 5 (five) minutes as needed for chest pain. For chest pain  . tamsulosin (FLOMAX) 0.4 MG CAPS capsule Take 0.4 mg by mouth daily as needed (bladder).   . torsemide (DEMADEX) 10 MG tablet Take 1 tablet (10 mg total) by mouth 2 (two) times daily. (Patient taking differently: Take 10 mg by mouth daily. May take additional 10mg  by mouth in evening as needed for fluid)  . warfarin (COUMADIN) 2.5 MG tablet Take 2.5-5 mg by mouth See admin instructions. Pt takes 2.5mg  by mouth once daily on Mon, Wed, Fri - takes 5mg  once daily Tues, Thurs, Sat, Sun  . Zinc 50 MG CAPS Take 50 mg by mouth every evening.     Scheduled Meds: . carvedilol  6.25 mg Oral BID WC  . ciprofloxacin  750 mg Oral Q breakfast  . clopidogrel  75 mg Oral Daily  . collagenase   Topical  Daily  . heparin  5,000 Units Subcutaneous Q8H  . insulin aspart  0-15 Units Subcutaneous TID WC  . insulin aspart  0-5 Units Subcutaneous QHS  . insulin glargine  5 Units Subcutaneous BID  . isosorbide dinitrate  10 mg Oral TID  . levothyroxine  50 mcg Oral QAC breakfast  . pantoprazole  40 mg Oral QHS  . protein supplement shake  11 oz Oral BID BM   Continuous Infusions: . sodium chloride Stopped (10/26/17 1346)  . vancomycin Stopped (10/28/17 1035)   PRN Meds:.sodium chloride, acetaminophen (TYLENOL) oral liquid 160 mg/5 mL  Allergies:  Allergies  Allergen Reactions  . Allegra [Fexofenadine] Itching  . Crestor [Rosuvastatin Calcium] Other (See Comments)    Body aches  . Codeine Other (See Comments)    blisters, but able to take hydrocodone    Family History  Problem Relation Age of Onset  . Diabetes Mother        Bilateral leg amputation  . Heart disease Mother        Before age 28  . Hypertension Mother   . Liver disease Mother        fatty liver   . Cancer Father        Prostate and Bone  . Diabetes Father   .  Cancer Brother        Prostate  . Heart disease Other        family h/o premature cardiovascular disease  . Cancer Sister        Colon   . Hypertension Sister     Social History:  reports that he quit smoking about 52 years ago. His smoking use included cigarettes. He started smoking about 67 years ago. He has a 6.00 pack-year smoking history. He quit smokeless tobacco use about 52 years ago. He reports that he drinks alcohol. He reports that he does not use drugs.  ROS: A complete review of systems was performed.  All systems are negative except for pertinent findings as noted.  Physical Exam:  Vital signs in last 24 hours: Temp:  [97.4 F (36.3 C)-98.2 F (36.8 C)] 98.2 F (36.8 C) (12/13 0346) Pulse Rate:  [64-114] 102 (12/13 1438) Resp:  [18] 18 (12/13 1438) BP: (103-136)/(73-80) 121/80 (12/13 1438) SpO2:  [98 %-99 %] 98 % (12/13 1438) Weight:  [119.3 kg (263 lb 0.1 oz)] 119.3 kg (263 lb 0.1 oz) (12/13 0346) Constitutional:  Alert and oriented, No acute distress; obese Cardiovascular: Regular rate and rhythm Respiratory: Normal respiratory effort GI: Abdomen is soft, nontender, nondistended, no abdominal masses GU: soft bilateral scrotal edema with no skin breakdown; circ penis is retracted; bilateral inguinal hernias appreciated  Lymphatic: No lymphadenopathy Neurologic: Grossly intact, no focal deficits Psychiatric: Normal mood and affect  Laboratory Data:  Recent Labs    10/28/17 0533  WBC 8.8  HGB 9.1*  HCT 29.2*  PLT 290    Recent Labs    10/27/17 1035 10/28/17 0533  NA 134* 135  K 3.8 3.9  CL 104 104  GLUCOSE 163* 114*  BUN 52* 48*  CALCIUM 8.5* 8.8*  CREATININE 2.42* 2.29*     Results for orders placed or performed during the hospital encounter of 10/23/17 (from the past 24 hour(s))  Glucose, capillary     Status: Abnormal   Collection Time: 10/27/17  5:09 PM  Result Value Ref Range   Glucose-Capillary 145 (H) 65 - 99 mg/dL  Glucose, capillary      Status: Abnormal  Collection Time: 10/27/17  9:25 PM  Result Value Ref Range   Glucose-Capillary 121 (H) 65 - 99 mg/dL  CBC     Status: Abnormal   Collection Time: 10/28/17  5:33 AM  Result Value Ref Range   WBC 8.8 4.0 - 10.5 K/uL   RBC 2.93 (L) 4.22 - 5.81 MIL/uL   Hemoglobin 9.1 (L) 13.0 - 17.0 g/dL   HCT 99.3 (L) 71.6 - 96.7 %   MCV 99.7 78.0 - 100.0 fL   MCH 31.1 26.0 - 34.0 pg   MCHC 31.2 30.0 - 36.0 g/dL   RDW 89.3 (H) 81.0 - 17.5 %   Platelets 290 150 - 400 K/uL  Basic metabolic panel     Status: Abnormal   Collection Time: 10/28/17  5:33 AM  Result Value Ref Range   Sodium 135 135 - 145 mmol/L   Potassium 3.9 3.5 - 5.1 mmol/L   Chloride 104 101 - 111 mmol/L   CO2 18 (L) 22 - 32 mmol/L   Glucose, Bld 114 (H) 65 - 99 mg/dL   BUN 48 (H) 6 - 20 mg/dL   Creatinine, Ser 1.02 (H) 0.61 - 1.24 mg/dL   Calcium 8.8 (L) 8.9 - 10.3 mg/dL   GFR calc non Af Amer 26 (L) >60 mL/min   GFR calc Af Amer 30 (L) >60 mL/min   Anion gap 13 5 - 15  Glucose, capillary     Status: Abnormal   Collection Time: 10/28/17  8:07 AM  Result Value Ref Range   Glucose-Capillary 119 (H) 65 - 99 mg/dL  Glucose, capillary     Status: Abnormal   Collection Time: 10/28/17 11:50 AM  Result Value Ref Range   Glucose-Capillary 143 (H) 65 - 99 mg/dL   Recent Results (from the past 240 hour(s))  Culture, blood (routine x 2)     Status: None   Collection Time: 10/23/17  6:12 AM  Result Value Ref Range Status   Specimen Description BLOOD LEFT HAND  Final   Special Requests IN PEDIATRIC BOTTLE Blood Culture adequate volume  Final   Culture NO GROWTH 5 DAYS  Final   Report Status 10/28/2017 FINAL  Final  Culture, blood (routine x 2)     Status: None   Collection Time: 10/23/17  6:41 AM  Result Value Ref Range Status   Specimen Description BLOOD LEFT THUMB  Final   Special Requests IN PEDIATRIC BOTTLE Blood Culture adequate volume  Final   Culture NO GROWTH 5 DAYS  Final   Report Status 10/28/2017  FINAL  Final  MRSA PCR Screening     Status: None   Collection Time: 10/23/17  7:00 AM  Result Value Ref Range Status   MRSA by PCR NEGATIVE NEGATIVE Final    Comment:        The GeneXpert MRSA Assay (FDA approved for NASAL specimens only), is one component of a comprehensive MRSA colonization surveillance program. It is not intended to diagnose MRSA infection nor to guide or monitor treatment for MRSA infections.   Culture, respiratory (NON-Expectorated)     Status: None   Collection Time: 10/23/17  9:00 AM  Result Value Ref Range Status   Specimen Description TRACHEAL ASPIRATE  Final   Special Requests NONE  Final   Gram Stain   Final    RARE WBC PRESENT, PREDOMINANTLY PMN FEW GRAM POSITIVE COCCI IN PAIRS RARE GRAM NEGATIVE RODS RARE GRAM NEGATIVE COCCOBACILLI    Culture Consistent with normal respiratory flora.  Final   Report Status 10/25/2017 FINAL  Final  Urine Culture     Status: None   Collection Time: 10/23/17  5:35 PM  Result Value Ref Range Status   Specimen Description URINE, RANDOM  Final   Special Requests NONE  Final   Culture NO GROWTH  Final   Report Status 10/24/2017 FINAL  Final    Renal Function: Recent Labs    10/23/17 0523 10/24/17 0321 10/25/17 0452 10/27/17 1035 10/28/17 0533  CREATININE 3.28* 2.91* 2.75* 2.42* 2.29*   Estimated Creatinine Clearance: 33.9 mL/min (A) (by C-G formula based on SCr of 2.29 mg/dL (H)).  Radiologic Imaging: Dg Chest Port 1 View  Result Date: 10/27/2017 CLINICAL DATA:  Shortness of breath, respiratory failure, CHF, coronary artery disease, sepsis. EXAM: PORTABLE CHEST 1 VIEW COMPARISON:  Portable chest x-ray of October 25, 2017 FINDINGS: The lungs are adequately inflated. The interstitial markings are increased though stable. The cardiac silhouette is enlarged. The pulmonary vascularity is mildly engorged. There is calcification in the wall of the aortic arch. The ICD is in stable position. The trachea is  midline. IMPRESSION: CHF with mild interstitial edema. No alveolar pneumonia nor pleural effusion. Thoracic aortic atherosclerosis. Electronically Signed   By: David  [place] M.D.   On: 10/27/2017 07:59     Impression/Recommendation:  Urinary retention--contributing factors include scrotal edema, decreased mobility, possible BPH, and possible inflammation from previous cath placement.  Will place cath due to high PVR and pt difficulty/straining with voiding. Continue Flomax. Pt needs good bowel regimen to ensure no constipation which would contribute to retention.  Can elevate scrotum with towel to aid with edema.  Scrotal enlargement is due to diffuse lower body edema (pt also has bilateral LE edema) and possibly hernias.  Will check scrotal U/S for complete eval.  Pt is interested in having hernias repaired.  Advised him to speak with IM about gen surg consult.  No evidence of bacterial UTI but per IM note there was yeast noted on outside UA.  Suggest treating with diflucan.   Harrie Foreman 10/28/2017, 2:58 PM

## 2017-10-28 NOTE — Progress Notes (Signed)
Bladder scan competed. Patient has 473 remaining in bladder after voiding. Will notify MD

## 2017-10-28 NOTE — Consult Note (Signed)
ORTHOPAEDIC CONSULTATION  REQUESTING PHYSICIAN: Darlin Drop, DO  Chief Complaint: Gangrenous ulcer plantar aspect left forefoot  HPI: [name] is a 76 y.o. male who presents with gangrenous ulcer plantar aspect left forefoot.  Patient states that he stepped on a tack about 6 weeks ago.  Patient was initially seen in the emergency room was started on antibiotics he followed up with his primary care physician return to the hospital was admitted and placed on IV antibiotics at [facility].  Patient has been at skilled nursing and was referred back to the hospital for urosepsis.  Patient is status post balloon angioplasty and stent placement by Dr. Durene Cal approximately 2 weeks ago.  Patient was seen by Dr. Waverly Ferrari 2 days ago and felt that the revascularization was patent and patient was scheduled for follow-up in the office.  Past Medical History:  Diagnosis Date  . AICD (automatic cardioverter/defibrillator) present   . Anemia   . Anemia-chronic    a. Pt reports h/o anemia - was offered IV iron in past by nephrologist but declined and has taken PO instead.  . Aneurysm, cerebral   . Arthritis    bilateral wrist and fingers  . BPH (benign prostatic hypertrophy)   . Cardiomyopathy, ischemic    LVEF 25-35%.  . Chronic kidney disease, stage III (moderate) (HCC)    Followed by Dr. Fausto Skillern.  . Coronary atherosclerosis of native coronary artery    a. DES to RCA 03/2009. b. s/p PTCA to RCA for ISR, 05/2010. c. 03/2013 NSTEMI 2/2 severe prox RCA s/p PTCA/DES, med rx for residual dz.  . Cystitis 10/2017  . Essential hypertension, benign   . Hyperlipidemia   . Hypothyroidism   . LBBB (left bundle branch block)    s/p BiV ICD Implanted by Dr Graciela Husbands  . Morbid obesity (HCC)   . MRSA (methicillin resistant Staphylococcus aureus)   . Myocardial infarction (HCC)   . Paroxysmal atrial fibrillation (HCC)    a. Coumadin discontinue in 2010 when the patient required  ASA/Plavix for stenting. b. Coumadin restarted 05/2013, Plavix continued, ASA stopped.   . Pneumonia    years ago 78  . Pulmonary nodule    CXR, 03/2013, [facility] - not described on any subsequent chest x-rays however, could be a vascular structure  . TIA (transient ischemic attack)    Multiple TIAs  . Type 2 diabetes mellitus (HCC)    Past Surgical History:  Procedure Laterality Date  . BIV ICD GENERATOR CHANGEOUT N/A 03/18/2017   Medtronic Claria MRI conditional CRT D device implated by Dr Johney Frame  . CHOLECYSTECTOMY    . ESOPHAGOGASTRODUODENOSCOPY N/A 10/06/2017   Procedure: ESOPHAGOGASTRODUODENOSCOPY (EGD);  Surgeon: Kerin Salen, MD;  Location: [facility];  Service: Gastroenterology;  Laterality: N/A;  . ESOPHAGOGASTRODUODENOSCOPY N/A 10/07/2017   Procedure: ESOPHAGOGASTRODUODENOSCOPY (EGD);  Surgeon: Kathi Der, MD;  Location: [facility];  Service: Gastroenterology;  Laterality: N/A;  . ESOPHAGOGASTRODUODENOSCOPY (EGD) WITH PROPOFOL N/A 10/05/2017   Procedure: ESOPHAGOGASTRODUODENOSCOPY (EGD) WITH PROPOFOL;  Surgeon: West Bali, MD;  Location: [facility];  Service: Endoscopy;  Laterality: N/A;  has ICD, just FYI  . ICD placement  05/06/2011   Medtronic CRT-D  . LEFT HEART CATHETERIZATION WITH CORONARY ANGIOGRAM N/A 04/17/2013   Procedure: LEFT HEART CATHETERIZATION WITH CORONARY ANGIOGRAM;  Surgeon: Vesta Mixer, MD;  Location: [facility] CATH LAB;  Service: Cardiovascular;  Laterality: N/A;  . LOWER EXTREMITY ANGIOGRAPHY Left 10/12/2017   Procedure: Lower Extremity Angiography;  Surgeon:  Nada Libman, MD;  Location: [facility] INVASIVE CV LAB;  Service: Cardiovascular;  Laterality: Left;  . LUNG SURGERY     24 - 25 yrs. old  . PERIPHERAL VASCULAR ATHERECTOMY Left 10/12/2017   Procedure: PERIPHERAL VASCULAR ATHERECTOMY;  Surgeon: Nada Libman, MD;  Location: [facility] INVASIVE CV LAB;  Service: Cardiovascular;  Laterality: Left;  SFA/POPLITEAL  . TOTAL KNEE ARTHROPLASTY    . VASECTOMY      Social History   Socioeconomic History  . Marital status: Married    Spouse name: None  . Number of children: None  . Years of education: None  . Highest education level: None  Social Needs  . Financial resource strain: None  . Food insecurity - worry: None  . Food insecurity - inability: None  . Transportation needs - medical: None  . Transportation needs - non-medical: None  Occupational History  . None  Tobacco Use  . Smoking status: Former Smoker    Packs/day: 1.00    Years: 6.00    Pack years: 6.00    Types: Cigarettes    Start date: 12/08/1949    Last attempt to quit: 11/16/1964    Years since quitting: 52.9  . Smokeless tobacco: Former Neurosurgeon    Quit date: 04/15/1965  Substance and Sexual Activity  . Alcohol use: Yes    Alcohol/week: 0.0 oz    Comment: once/month  . Drug use: No  . Sexual activity: None  Other Topics Concern  . None  Social History Narrative  . None   Family History  Problem Relation Age of Onset  . Diabetes Mother        Bilateral leg amputation  . Heart disease Mother        Before age 47  . Hypertension Mother   . Liver disease Mother        fatty liver   . Cancer Father        Prostate and Bone  . Diabetes Father   . Cancer Brother        Prostate  . Heart disease Other        family h/o premature cardiovascular disease  . Cancer Sister        Colon   . Hypertension Sister    - negative except otherwise stated in the family history section Allergies  Allergen Reactions  . Allegra [Fexofenadine] Itching  . Crestor [Rosuvastatin Calcium] Other (See Comments)    Body aches  . Codeine Other (See Comments)    blisters, but able to take hydrocodone   Prior to Admission medications   Medication Sig Start Date End Date Taking? Authorizing Provider  acetaminophen (TYLENOL 8 HOUR ARTHRITIS PAIN) 650 MG CR tablet Take 1 tablet by mouth daily as needed for pain. 09/15/10  Yes [provider]  carvedilol (COREG) 12.5 MG  tablet TAKE 1 AND 1/2 TABLETS BY MOUTH TWICE DAILY WITH MEALS (DUE FOR FOLLOW UP) Patient taking differently: 12.5mg  by mouth twice daily 06/30/17  Yes Jonelle Sidle, MD  Cholecalciferol (VITAMIN D3) 10000 units TABS Take 1,000 Units by mouth daily.   Yes [provider]  clopidogrel (PLAVIX) 75 MG tablet Take 1 tablet (75 mg total) by mouth daily. 03/25/17  Yes Allred, Fayrene Fearing, MD  hydrALAZINE (APRESOLINE) 25 MG tablet Take 25 mg by mouth 3 (three) times daily.   Yes [provider]  HYDROcodone-acetaminophen (NORCO) 7.5-325 MG tablet Take 1 tablet by mouth every 6 (six) hours as needed for moderate pain. 10/15/17  Yes Zannie Cove, MD  insulin lispro protamine-lispro (HUMALOG 75/25 MIX) (75-25) 100 UNIT/ML SUSP injection Inject 10 Units into the skin 2 (two) times daily with a meal. Take 10units BID with meals Patient taking differently: Inject 10 Units into the skin 2 (two) times daily with a meal.  10/15/17  Yes Zannie Cove, MD  isosorbide mononitrate (IMDUR) 30 MG 24 hr tablet Take 30 mg by mouth daily.   Yes [provider]  l-methylfolate-B6-B12 (METANX) 3-35-2 MG TABS tablet Take 1 tablet by mouth daily.   Yes [provider]  levothyroxine (SYNTHROID, LEVOTHROID) 50 MCG tablet Take 50 mcg by mouth daily. 06/29/17  Yes [provider]  lisinopril (PRINIVIL,ZESTRIL) 2.5 MG tablet Take 2.5 mg by mouth daily.   Yes [provider]  nitroGLYCERIN (NITROSTAT) 0.4 MG SL tablet Place 0.4 mg under the tongue every 5 (five) minutes as needed for chest pain. For chest pain 04/26/13  Yes Serpe, Clide Deutscher, PA-C  tamsulosin (FLOMAX) 0.4 MG CAPS capsule Take 0.4 mg by mouth daily as needed (bladder).    Yes [provider]  torsemide (DEMADEX) 10 MG tablet Take 1 tablet (10 mg total) by mouth 2 (two) times daily. Patient taking differently: Take 10 mg by mouth daily. May take additional 10mg  by mouth in evening as needed for fluid 12/30/15   Yes Jonelle Sidle, MD  warfarin (COUMADIN) 2.5 MG tablet Take 2.5-5 mg by mouth See admin instructions. Pt takes 2.5mg  by mouth once daily on Mon, Wed, Fri - takes 5mg  once daily Tues, Thurs, Sat, Sun   Yes [provider]  Zinc 50 MG CAPS Take 50 mg by mouth every evening.    Yes [provider]   US Scrotum  Result Date: 10/28/2017 CLINICAL DATA:  Bilateral full swelling. EXAM: SCROTAL ULTRASOUND DOPPLER ULTRASOUND OF THE TESTICLES TECHNIQUE: Complete ultrasound examination of the testicles, epididymis, and other scrotal structures was performed. Color and spectral Doppler ultrasound were also utilized to evaluate blood flow to the testicles. COMPARISON:  CT 06/13/2013. FINDINGS: Right testicle Measurements: 4.4 x 2.3 x 2.8 cm. No mass or microlithiasis visualized. Left testicle Measurements: 3.8 x 2.2 x 3.5 cm. No mass or microlithiasis visualized. Right epididymis:  Normal in size and appearance. Left epididymis:  Normal in size and appearance. Hydrocele:  Small right. Varicocele:  Small left. Pulsed Doppler interrogation of both testes demonstrates bilateral color Doppler flow. Diffuse scrotal wall thickening. No scrotal wall fluid collections are noted. IMPRESSION: 1. Diffuse scrotal wall thickening. 2. Small right hydrocele. Small left varicocele. No evidence of testicular mass or torsion . Electronically Signed   By: Maisie Fus  Register   On: 10/28/2017 15:42   Dg Chest Port 1 View  Result Date: 10/27/2017 CLINICAL DATA:  Shortness of breath, respiratory failure, CHF, coronary artery disease, sepsis. EXAM: PORTABLE CHEST 1 VIEW COMPARISON:  Portable chest x-ray of October 25, 2017 FINDINGS: The lungs are adequately inflated. The interstitial markings are increased though stable. The cardiac silhouette is enlarged. The pulmonary vascularity is mildly engorged. There is calcification in the wall of the aortic arch. The ICD is in stable position. The trachea is midline.  IMPRESSION: CHF with mild interstitial edema. No alveolar pneumonia nor pleural effusion. Thoracic aortic atherosclerosis. Electronically Signed   By: David  [place] M.D.   On: 10/27/2017 07:59   - pertinent xrays, CT, MRI studies were reviewed and independently interpreted  Positive ROS: All other systems have been reviewed and were otherwise negative with the exception  of those mentioned in the HPI and as above.  Physical Exam: General: Alert, no acute distress Psychiatric: Patient is competent for consent with normal mood and affect Lymphatic: No axillary or cervical lymphadenopathy Cardiovascular: No pedal edema Respiratory: No cyanosis, no use of accessory musculature GI: No organomegaly, abdomen is soft and non-tender  Skin: Examination patient has a large black gangrenous ulcer on the plantar aspect of the left forefoot.  The ulcer edges were debrided and there is no purulent abscess.  There is ischemic changes beneath the dry gangrenous eschar.  The eschar is approximately 4 x 5 cm.   Neurologic: Patient does not have protective sensation bilateral lower extremities.   MUSCULOSKELETAL:  Examination I cannot palpate a dorsalis pedis pulse he has significant venous stasis changes with brawny skin color changes and pitting edema of the foot ankle and leg.  Review of his previous ankle-brachial indices study in November showed noncompressible vessels.  Radiographs in November shows calcification of the arteries in the foot does not show any destructive bony changes.  Patient has no asending cellulitis.  Patient is status post previous revascularization to the left lower extremity as noted above  Assessment: Assessment: 2 weeks status post revascularization to the left lower extremity with patent flow through the posterior tibial artery with a large gangrenous ulcer plantar aspect of the left forefoot.  Plan: Plan: Patient is scheduled for a three-phase nuclear medicine bone scan.  If  this does not show infection in the bone would continue to follow this conservatively with dressing changes with Santyl ointment.  If there is bone involvement with infection would recommend proceeding with a transtibial amputation.  I will follow-up after the three-phase bone study.  Thank you for the consult and the opportunity to see Mr. Cordelro Gautreau, MD [facility] [phone] 6:32 PM

## 2017-10-28 NOTE — Progress Notes (Addendum)
PROGRESS NOTE  [name] GQQ:[id number] DOB: [birth year] DOA: 10/23/2017 PCP: [name], MD  HPI/Recap of past 24 hours: 76 diabetic, CAD, CKD3, ischemic cardiomyopathy status post AICD, PAF on coumadin, RA who was admitted on 10/23/17 with altered mental status, intubated for airway protection and transferred to [facility] for further workup. Coumadin held due to recent UGI bleed (09/2017).  No reported acute events overnight. POD#2 post left foot endovascular revascularization. Left foot with escar tissue present. Orthopedic surgery consulted to evaluate for possible debridement. Dr. Lajoyce Corners will see the patient tomorrow 10/29/17.  The patient was seen and examined with his wife at his bedside. He denies any pain in his left foot. He is concerned about his scrotum enlargement and low urine output. US scrotum to rule out hydrocele vs hernia and bladder ultrasound to rule out urinay retention ordered.  Assessment/Plan: Active Problems:   Respiratory failure (HCC)   Sepsis (HCC)   Septic shock (HCC)   Acute cystitis without hematuria   Atrial fibrillation with rapid ventricular response (HCC)   Metabolic encephalopathy  AMS, resolved -most likely 2/2 to sepsis  Recent septic shock 2/2 to LLE cellulitis/wound, poa -off pressors 10/26/17 -transferred to medicine team 10/27/17 -on IV vancomycin and po cipro day #2/5  LLE wound, poa -post endovascular revascularization POD #2 -vascular surgery following -orthopedic surgery consulted for possible debridement if indicated -Dr Lajoyce Corners will see in the am 10/29/17 -wound care specialist consulted  Ischemic CM with EF 25-30% (10/13/17) -cardiology following -appreciate recommendations -carvedilol, plavix, isordil -allergic to statin  Elevated troponin from demand ischemia -troponin trended down 1.32 (10/24/17) peaked at 3.42 (10/23/17) -cardiology following  Acute on Chronic HFREF 25-30% -management as stated above -allergic to  statin  Severe PVD post POD#2 post left foot endovascular revascularization.  -vascular surgery following -we appreciate recs -plavix -reported allergy to statin  AKI on CKD 3 -improving -cr 2.29 from 2.42 from 2.75 -baseline cr 1.7 -avoid nephrotoxic agents/hypotension  Mild hyponatremia, resolved -Na+ 135 from 134 -could be hemodilutional -BMP am  Hypothyroidism -synthroid -tsh am  Recent GI bleed -Consult GI for recommendation on restarting oral anticoagulation -Hg stable -no reported acute GI bleed -The writer paged GI on call [phone]  Deconditioning -PT to evaluate and treat -PT has recommended SNF  Enlarged scrotum r/o hydrocele vs hernia -scrotum U/S -patient denies scrotal pain at the time of this examination  Suspected urinary retention -bladder U/s ordered   Code Status: Full  Family Communication: No family member at bedside  Disposition Plan: will stay another midnight to continue current management. Gen surgery will evaluate in the am   Consultants:  Cardiology  Vascular duergery  Procedures:  POD #1 post left foot endovascular revascularization.  Antimicrobials:  IV vancomycin  Po cipro day #1/5  DVT prophylaxis:  sq heparin 5000 U TID   Objective: Vitals:   10/27/17 0619 10/27/17 1401 10/27/17 2158 10/28/17 0346  BP: 135/84 109/66 136/73 103/73  Pulse: 99 (!) 101 64 (!) 114  Resp: 17 18 18 18   Temp: 98.1 F (36.7 C) 98 F (36.7 C) (!) 97.4 F (36.3 C) 98.2 F (36.8 C)  TempSrc: Oral Oral Oral Oral  SpO2: 99% 96% 98% 99%  Weight: 118 kg (260 lb 2.3 oz)   119.3 kg (263 lb 0.1 oz)  Height:        Intake/Output Summary (Last 24 hours) at 10/28/2017 0830 Last data filed at 10/28/2017 0617 Gross per 24 hour  Intake 620 ml  Output -  Net 620 ml   Filed Weights   10/26/17 0500 10/27/17 0619 10/28/17 0346  Weight: 116.7 kg (257 lb 4.4 oz) 118 kg (260 lb 2.3 oz) 119.3 kg (263 lb 0.1 oz)    Exam:   General:   77 CM WD WN NAD   Cardiovascular: RRR no rubs or gallops  Respiratory: CTA no wheezes or rhonchi  Abdomen: obese ND NT NBS x4 quadrants  Musculoskeletal: Escar tissue noted at plantar region of left foot below toes. 1/4 left dorsalis pedis pulse  Skin: as noted above  Psychiatry: mood is appropriate for condition and setting   Data Reviewed: CBC: Recent Labs  Lab 10/23/17 0523 10/24/17 0321 10/25/17 0452 10/28/17 0533  WBC 14.3* 13.1* 11.1* 8.8  HGB 8.6* 8.4* 7.8* 9.1*  HCT 27.2* 26.1* 24.4* 29.2*  MCV 96.5 96.3 99.2 99.7  PLT 339 250 255 290   Basic Metabolic Panel: Recent Labs  Lab 10/23/17 0523 10/24/17 0321 10/25/17 0452 10/27/17 1035 10/28/17 0533  NA 135 137 138 134* 135  K 4.0 3.2* 3.2* 3.8 3.9  CL 100* 108 106 104 104  CO2 19* 19* 22 20* 18*  GLUCOSE 309* 158* 106* 163* 114*  BUN 64* 72* 67* 52* 48*  CREATININE 3.28* 2.91* 2.75* 2.42* 2.29*  CALCIUM 8.2* 7.8* 8.2* 8.5* 8.8*  MG  --  1.7 2.2 2.0  --   PHOS  --  3.4 3.3 3.3  --    GFR: Estimated Creatinine Clearance: 33.9 mL/min (A) (by C-G formula based on SCr of 2.29 mg/dL (H)). Liver Function Tests: Recent Labs  Lab 10/23/17 0523 10/26/17 1140 10/27/17 1035  AST 1,352* 363* 231*  ALT 543* 345* 295*  ALKPHOS 213* 195* 198*  BILITOT 1.0 0.6 0.7  PROT 6.3* 6.0* 5.6*  ALBUMIN 2.2* 2.0* 2.0*   No results for input(s): LIPASE, AMYLASE in the last 168 hours. No results for input(s): AMMONIA in the last 168 hours. Coagulation Profile: No results for input(s): INR, PROTIME in the last 168 hours. Cardiac Enzymes: Recent Labs  Lab 10/23/17 0523 10/23/17 1123 10/23/17 2249 10/24/17 0321 10/24/17 1123  TROPONINI 0.42* 2.81* 3.42* 2.33* 1.32*   BNP (last 3 results) No results for input(s): PROBNP in the last 8760 hours. HbA1C: No results for input(s): HGBA1C in the last 72 hours. CBG: Recent Labs  Lab 10/27/17 0816 10/27/17 1127 10/27/17 1709 10/27/17 2125 10/28/17 0807  GLUCAP 150*  166* 145* 121* 119*   Lipid Profile: No results for input(s): CHOL, HDL, LDLCALC, TRIG, CHOLHDL, LDLDIRECT in the last 72 hours. Thyroid Function Tests: No results for input(s): TSH, T4TOTAL, FREET4, T3FREE, THYROIDAB in the last 72 hours. Anemia Panel: No results for input(s): VITAMINB12, FOLATE, FERRITIN, TIBC, IRON, RETICCTPCT in the last 72 hours. Urine analysis:    Component Value Date/Time   COLORURINE AMBER (A) 10/12/2017 0450   APPEARANCEUR CLOUDY (A) 10/12/2017 0450   LABSPEC 1.024 10/12/2017 0450   PHURINE 5.0 10/12/2017 0450   GLUCOSEU NEGATIVE 10/12/2017 0450   HGBUR LARGE (A) 10/12/2017 0450   BILIRUBINUR NEGATIVE 10/12/2017 0450   KETONESUR 5 (A) 10/12/2017 0450   PROTEINUR 100 (A) 10/12/2017 0450   UROBILINOGEN 0.2 07/05/2014 2117   NITRITE NEGATIVE 10/12/2017 0450   LEUKOCYTESUR TRACE (A) 10/12/2017 0450   Sepsis Labs: @LABRCNTIP (procalcitonin:4,lacticidven:4)  ) Recent Results (from the past 240 hour(s))  Culture, blood (routine x 2)     Status: None (Preliminary result)   Collection Time: 10/23/17  6:12 AM  Result Value Ref Range Status  Specimen Description BLOOD LEFT HAND  Final   Special Requests IN PEDIATRIC BOTTLE Blood Culture adequate volume  Final   Culture NO GROWTH 4 DAYS  Final   Report Status PENDING  Incomplete  Culture, blood (routine x 2)     Status: None (Preliminary result)   Collection Time: 10/23/17  6:41 AM  Result Value Ref Range Status   Specimen Description BLOOD LEFT THUMB  Final   Special Requests IN PEDIATRIC BOTTLE Blood Culture adequate volume  Final   Culture NO GROWTH 4 DAYS  Final   Report Status PENDING  Incomplete  MRSA PCR Screening     Status: None   Collection Time: 10/23/17  7:00 AM  Result Value Ref Range Status   MRSA by PCR NEGATIVE NEGATIVE Final    Comment:        The GeneXpert MRSA Assay (FDA approved for NASAL specimens only), is one component of a comprehensive MRSA colonization surveillance program.  It is not intended to diagnose MRSA infection nor to guide or monitor treatment for MRSA infections.   Culture, respiratory (NON-Expectorated)     Status: None   Collection Time: 10/23/17  9:00 AM  Result Value Ref Range Status   Specimen Description TRACHEAL ASPIRATE  Final   Special Requests NONE  Final   Gram Stain   Final    RARE WBC PRESENT, PREDOMINANTLY PMN FEW GRAM POSITIVE COCCI IN PAIRS RARE GRAM NEGATIVE RODS RARE GRAM NEGATIVE COCCOBACILLI    Culture Consistent with normal respiratory flora.  Final   Report Status 10/25/2017 FINAL  Final  Urine Culture     Status: None   Collection Time: 10/23/17  5:35 PM  Result Value Ref Range Status   Specimen Description URINE, RANDOM  Final   Special Requests NONE  Final   Culture NO GROWTH  Final   Report Status 10/24/2017 FINAL  Final      Studies: No results found.  Scheduled Meds: . carvedilol  6.25 mg Oral BID WC  . ciprofloxacin  750 mg Oral Q breakfast  . clopidogrel  75 mg Oral Daily  . collagenase   Topical Daily  . heparin  5,000 Units Subcutaneous Q8H  . insulin aspart  0-15 Units Subcutaneous TID WC  . insulin aspart  0-5 Units Subcutaneous QHS  . insulin glargine  5 Units Subcutaneous BID  . isosorbide dinitrate  10 mg Oral TID  . levothyroxine  50 mcg Oral QAC breakfast  . pantoprazole  40 mg Oral QHS  . protein supplement shake  11 oz Oral BID BM    Continuous Infusions: . sodium chloride Stopped (10/26/17 1346)  . vancomycin 1,500 mg (10/28/17 0617)     LOS: 5 days     Darlin Drop, MD [facility] Pager [phone]  If 7PM-7AM, please contact night-coverage [email or web address] Password [email or web address] 10/28/2017, 8:30 AM

## 2017-10-28 NOTE — Progress Notes (Signed)
Nutrition Follow-up  DOCUMENTATION CODES:   Obesity unspecified  INTERVENTION:  1. Premier Protein BID, each supplement provides 160 calories and 30 gm of protein  NUTRITION DIAGNOSIS:   Inadequate oral intake related to poor appetite as evidenced by per patient/family report. -new dx  GOAL:   Patient will meet greater than or equal to 90% of their needs -not met  MONITOR:   PO intake, Weight trends, I & O's, Labs, Skin  ASSESSMENT:   76 y/o male PMHx DM, CAD, PAF, CKD3, RA, Ischemic cardiomyopathy s/p ICD, HLD/HTN. Admitted for 11/12-11/30 for L foot cellulitis, c/b GIB. S/p L SFA/popliteral atherectomy 11/27. Presented from SNF w/ AMS and confusion. Worked up for elevated LA, abnormal UA, hypotension. Suspected Urosepsis. Intubated for airway protection in setting of metabolic encephalopathy. RD consulted for TF.   Extubated, Off Pressors 12/9 Septic Shock resolved 12/11 S/p endovascular revascularization LLE 12/11 Resting at bedside. Says his appetite is not back to where he wants it to be. Had an ensure yesterday but felt it raised his blood sugar too much. Reports usual blood sugar of 120-150. Encouraged PO intake. UBW of 252 pounds. Was eating well PTA per patient. Has been here almost 30 days now between his two admissions. Meal Completion: 75, 50, 10%  Labs reviewed Medications reviewed and include:  Insulin   Diet Order:  Diet heart healthy/carb modified Room service appropriate? Yes; Fluid consistency: Thin  EDUCATION NEEDS:   No education needs have been identified at this time  Skin:  Skin Assessment: Skin Integrity Issues: Skin Integrity Issues:: Diabetic Ulcer, Stage II Stage II: Sacrum Diabetic Ulcer: At L foot  Last BM:  10/27/2017  Height:   Ht Readings from Last 1 Encounters:  10/25/17 '5\' 8"'  (1.727 m)    Weight:   Wt Readings from Last 1 Encounters:  10/28/17 263 lb 0.1 oz (119.3 kg)    Ideal Body Weight:  70 kg  BMI:  Body mass  index is 39.99 kg/m.  Estimated Nutritional Needs:   Kcal:  2200-2400 calories  Protein:  120-140 grams  Fluid:  2.2-2.4L  Satira Anis. Ples Trudel, MS, RD LDN Inpatient Clinical Dietitian Pager [phone]

## 2017-10-28 NOTE — Care Management Important Message (Signed)
Important Message  Patient Details  Name: [name] MRN: [id number] Date of Birth: 07/20/1941   Medicare Important Message Given:  Yes    Dorena Bodo 10/28/2017, 12:28 PM

## 2017-10-28 NOTE — Progress Notes (Signed)
Progress Note  Patient Name: [name] Date of Encounter: 10/28/2017  Primary Cardiologist: Nona Dell, MD  EP  Dr. Johney Frame  Subjective   No cardiac complaints Does not want to go to SNF   Inpatient Medications    Scheduled Meds: . carvedilol  6.25 mg Oral BID WC  . ciprofloxacin  750 mg Oral Q breakfast  . clopidogrel  75 mg Oral Daily  . collagenase   Topical Daily  . heparin  5,000 Units Subcutaneous Q8H  . insulin aspart  0-15 Units Subcutaneous TID WC  . insulin aspart  0-5 Units Subcutaneous QHS  . insulin glargine  5 Units Subcutaneous BID  . isosorbide dinitrate  10 mg Oral TID  . levothyroxine  50 mcg Oral QAC breakfast  . pantoprazole  40 mg Oral QHS  . protein supplement shake  11 oz Oral BID BM   Continuous Infusions: . sodium chloride Stopped (10/26/17 1346)  . vancomycin 1,500 mg (10/28/17 0617)   PRN Meds: sodium chloride, acetaminophen (TYLENOL) oral liquid 160 mg/5 mL   Vital Signs    Vitals:   10/27/17 0619 10/27/17 1401 10/27/17 2158 10/28/17 0346  BP: 135/84 109/66 136/73 103/73  Pulse: 99 (!) 101 64 (!) 114  Resp: 17 18 18 18   Temp: 98.1 F (36.7 C) 98 F (36.7 C) (!) 97.4 F (36.3 C) 98.2 F (36.8 C)  TempSrc: Oral Oral Oral Oral  SpO2: 99% 96% 98% 99%  Weight: 260 lb 2.3 oz (118 kg)   263 lb 0.1 oz (119.3 kg)  Height:        Intake/Output Summary (Last 24 hours) at 10/28/2017 0852 Last data filed at 10/28/2017 0617 Gross per 24 hour  Intake 620 ml  Output -  Net 620 ml   Filed Weights   10/26/17 0500 10/27/17 0619 10/28/17 0346  Weight: 257 lb 4.4 oz (116.7 kg) 260 lb 2.3 oz (118 kg) 263 lb 0.1 oz (119.3 kg)    Telemetry    AV pacing intermit ant PAF - Personally Reviewed  ECG    AV pacing PAF LBBB   Physical Exam  Obese white male  GEN: No acute distress.    Neck: No JVD Cardiac: RRR, no murmurs, rubs, or gallops. AICD under left clavicle  Respiratory: Clear to auscultation bilaterally. GI: Soft,  nontender, non-distended  MS: No edema; No deformity. Neuro:  Nonfocal  Psych: Normal affect  Left foot wrapped with doppler able PT pulse per VVS   Labs    Chemistry Recent Labs  Lab 10/23/17 0523  10/25/17 0452 10/26/17 1140 10/27/17 1035 10/28/17 0533  NA 135   < > 138  --  134* 135  K 4.0   < > 3.2*  --  3.8 3.9  CL 100*   < > 106  --  104 104  CO2 19*   < > 22  --  20* 18*  GLUCOSE 309*   < > 106*  --  163* 114*  BUN 64*   < > 67*  --  52* 48*  CREATININE 3.28*   < > 2.75*  --  2.42* 2.29*  CALCIUM 8.2*   < > 8.2*  --  8.5* 8.8*  PROT 6.3*  --   --  6.0* 5.6*  --   ALBUMIN 2.2*  --   --  2.0* 2.0*  --   AST 1,352*  --   --  363* 231*  --   ALT 543*  --   --  345* 295*  --   ALKPHOS 213*  --   --  195* 198*  --   BILITOT 1.0  --   --  0.6 0.7  --   GFRNONAA 17*   < > 21*  --  24* 26*  GFRAA 19*   < > 24*  --  28* 30*  ANIONGAP 16*   < > 10  --  10 13   < > = values in this interval not displayed.     Hematology Recent Labs  Lab 10/24/17 0321 10/25/17 0452 10/28/17 0533  WBC 13.1* 11.1* 8.8  RBC 2.71* 2.46* 2.93*  HGB 8.4* 7.8* 9.1*  HCT 26.1* 24.4* 29.2*  MCV 96.3 99.2 99.7  MCH 31.0 31.7 31.1  MCHC 32.2 32.0 31.2  RDW 17.5* 18.2* 19.3*  PLT 250 255 290    Cardiac Enzymes Recent Labs  Lab 10/23/17 1123 10/23/17 2249 10/24/17 0321 10/24/17 1123  TROPONINI 2.81* 3.42* 2.33* 1.32*   No results for input(s): TROPIPOC in the last 168 hours.   BNPNo results for input(s): BNP, PROBNP in the last 168 hours.   DDimer No results for input(s): DDIMER in the last 168 hours.   Radiology    Dg Chest Port 1 View  Result Date: 10/27/2017 CLINICAL DATA:  Shortness of breath, respiratory failure, CHF, coronary artery disease, sepsis. EXAM: PORTABLE CHEST 1 VIEW COMPARISON:  Portable chest x-ray of October 25, 2017 FINDINGS: The lungs are adequately inflated. The interstitial markings are increased though stable. The cardiac silhouette is enlarged. The  pulmonary vascularity is mildly engorged. There is calcification in the wall of the aortic arch. The ICD is in stable position. The trachea is midline. IMPRESSION: CHF with mild interstitial edema. No alveolar pneumonia nor pleural effusion. Thoracic aortic atherosclerosis. Electronically Signed   By: David  [place] M.D.   On: 10/27/2017 07:59    Cardiac Studies   ECHO 10/13/17 Study Conclusions  - Left ventricle: Diffuse hypokinesis worse in inferior wall and   abnormal septal motion The cavity size was moderately dilated.   Wall thickness was increased in a pattern of severe LVH. Systolic   function was severely reduced. The estimated ejection fraction   was in the range of 25% to 30%. Doppler parameters are consistent   with abnormal left ventricular relaxation (grade 1 diastolic   dysfunction). - Mitral valve: Calcified annulus. Mildly thickened leaflets .   There was mild regurgitation. - Atrial septum: No defect or patent foramen ovale was identified.   Patient Profile     76 y.o. male admitted with altered mental status and respiratory failureHas BiV AICD and PAF, elevated troponin PAF.     Assessment & Plan    Atrial fib - AV pacing now PaF resume coumadin per GI   CAD with elevated troponin form demand ischemia no chest pain, no plans for ischemic eval unless he becomes symptomatic on plavix and beta blocker   Respiratory failure extubated and CXR today with CHF mild interstitial edema no PNA, no pl. Effusion.  + thoracic aortic atherosclerosis  Continue antibiotics    CHF compensated ACE and demedex on hold Cr slowly improving 2.29 today  ICD Normal function per GT  PAF:  On beta blocker resume coumadin when ok with GI no active bleeding   PVD:  Seen by VVS thinks graft open 1 vessel run off slow to heal but stable   Patient would be better served by SNF but he wants to go home  Jenkins Rouge

## 2017-10-28 NOTE — Consult Note (Signed)
Reason for Consult:Left foot wound Referring Physician: [name] is an 76 y.o. male with multiple medical problems most relevant for severe PAD s/p revascularization of LLE 09/2017 by Dr. Trula Slade, DM, and cardiomyopathy with EF ~20%. HPI: [name] has been struggling with a wound on his left foot for about 6 weeks after he stepped on a tack. He developed some cellulitis and underwent the above procedure. He was discharged to SNF but was readmitted this time with urosepsis. He notes the foot is not hurting and is feeling better.  Past Medical History:  Diagnosis Date  . AICD (automatic cardioverter/defibrillator) present   . Anemia   . Anemia-chronic    a. Pt reports h/o anemia - was offered IV iron in past by nephrologist but declined and has taken PO instead.  . Aneurysm, cerebral   . Arthritis    bilateral wrist and fingers  . BPH (benign prostatic hypertrophy)   . Cardiomyopathy, ischemic    LVEF 25-35%.  . Chronic kidney disease, stage III (moderate) (HCC)    Followed by Dr. Hinda Lenis.  . Coronary atherosclerosis of native coronary artery    a. DES to RCA 03/2009. b. s/p PTCA to RCA for ISR, 05/2010. Tyler. 03/2013 NSTEMI 2/2 severe prox RCA s/p PTCA/DES, med rx for residual dz.  . Cystitis 10/2017  . Essential hypertension, benign   . Hyperlipidemia   . Hypothyroidism   . LBBB (left bundle branch block)    s/p BiV ICD Implanted by Dr Caryl Comes  . Morbid obesity ([place])   . MRSA (methicillin resistant Staphylococcus aureus)   . Myocardial infarction ([place])   . Paroxysmal atrial fibrillation ([place])    a. Coumadin discontinue in 2010 when the patient required ASA/Plavix for stenting. b. Coumadin restarted 05/2013, Plavix continued, ASA stopped.   . Pneumonia    years ago 54  . Pulmonary nodule    CXR, 03/2013, [facility] - not described on any subsequent chest x-rays however, could be a vascular structure  . TIA (transient ischemic attack)    Multiple TIAs  . Type 2 diabetes mellitus ([place])      Past Surgical History:  Procedure Laterality Date  . BIV ICD GENERATOR CHANGEOUT N/A 03/18/2017   Medtronic [place] MRI conditional CRT D device implated by Dr Rayann Heman  . CHOLECYSTECTOMY    . ESOPHAGOGASTRODUODENOSCOPY N/A 10/06/2017   Procedure: ESOPHAGOGASTRODUODENOSCOPY (EGD);  Surgeon: Ronnette Juniper, MD;  Location: [facility];  Service: Gastroenterology;  Laterality: N/A;  . ESOPHAGOGASTRODUODENOSCOPY N/A 10/07/2017   Procedure: ESOPHAGOGASTRODUODENOSCOPY (EGD);  Surgeon: Otis Brace, MD;  Location: [facility];  Service: Gastroenterology;  Laterality: N/A;  . ESOPHAGOGASTRODUODENOSCOPY (EGD) WITH PROPOFOL N/A 10/05/2017   Procedure: ESOPHAGOGASTRODUODENOSCOPY (EGD) WITH PROPOFOL;  Surgeon: Danie Binder, MD;  [facility];  Service: Endoscopy;  Laterality: N/A;  has ICD, just FYI  . ICD placement  05/06/2011   Medtronic CRT-D  . LEFT HEART CATHETERIZATION WITH CORONARY ANGIOGRAM N/A 04/17/2013   Procedure: LEFT HEART CATHETERIZATION WITH CORONARY ANGIOGRAM;  Surgeon: Thayer Headings, MD;  Location: [facility] CATH LAB;  Service: Cardiovascular;  Laterality: N/A;  . LOWER EXTREMITY ANGIOGRAPHY Left 10/12/2017   Procedure: Lower Extremity Angiography;  Surgeon: [name], MD;  Location: [place] CV LAB;  Service: Cardiovascular;  Laterality: Left;  . LUNG SURGERY     24 - 25 yrs. old  . PERIPHERAL VASCULAR ATHERECTOMY Left 10/12/2017   Procedure: PERIPHERAL VASCULAR ATHERECTOMY;  Surgeon: [name], MD;  Location: [place] CV  LAB;  Service: Cardiovascular;  Laterality: Left;  SFA/POPLITEAL  . TOTAL KNEE ARTHROPLASTY    . VASECTOMY      Family History  Problem Relation Age of Onset  . Diabetes Mother        Bilateral leg amputation  . Heart disease Mother        Before age 108  . Hypertension Mother   . Liver disease Mother        fatty liver   . Cancer Father        Prostate and Bone  . Diabetes Father   . Cancer Brother        Prostate  .  Heart disease Other        family h/o premature cardiovascular disease  . Cancer Sister        Colon   . Hypertension Sister     Social History:  reports that he quit smoking about 52 years ago. His smoking use included cigarettes. He started smoking about 67 years ago. He has a 6.00 pack-year smoking history. He quit smokeless tobacco use about 52 years ago. He reports that he drinks alcohol. He reports that he does not use drugs.  Allergies:  Allergies  Allergen Reactions  . Allegra [Fexofenadine] Itching  . Crestor [Rosuvastatin Calcium] Other (See Comments)    Body aches  . Codeine Other (See Comments)    blisters, but able to take hydrocodone    Medications: I have reviewed the patient's current medications.  Results for orders placed or performed during the hospital encounter of 10/23/17 (from the past 48 hour(s))  Glucose, capillary     Status: Abnormal   Collection Time: 10/26/17  5:22 PM  Result Value Ref Range   Glucose-Capillary 125 (H) 65 - 99 mg/dL  Glucose, capillary     Status: Abnormal   Collection Time: 10/26/17  9:51 PM  Result Value Ref Range   Glucose-Capillary 189 (H) 65 - 99 mg/dL  Glucose, capillary     Status: Abnormal   Collection Time: 10/27/17  8:16 AM  Result Value Ref Range   Glucose-Capillary 150 (H) 65 - 99 mg/dL  Comprehensive metabolic panel     Status: Abnormal   Collection Time: 10/27/17 10:35 AM  Result Value Ref Range   Sodium 134 (L) 135 - 145 mmol/L   Potassium 3.8 3.5 - 5.1 mmol/L   Chloride 104 101 - 111 mmol/L   CO2 20 (L) 22 - 32 mmol/L   Glucose, Bld 163 (H) 65 - 99 mg/dL   BUN 52 (H) 6 - 20 mg/dL   Creatinine, Ser 2.42 (H) 0.61 - 1.24 mg/dL   Calcium 8.5 (L) 8.9 - 10.3 mg/dL   Total Protein 5.6 (L) 6.5 - 8.1 g/dL   Albumin 2.0 (L) 3.5 - 5.0 g/dL   AST 231 (H) 15 - 41 U/L   ALT 295 (H) 17 - 63 U/L   Alkaline Phosphatase 198 (H) 38 - 126 U/L   Total Bilirubin 0.7 0.3 - 1.2 mg/dL   GFR calc non Af Amer 24 (L) >60 mL/min    GFR calc Af Amer 28 (L) >60 mL/min    Comment: (NOTE) The eGFR has been calculated using the CKD EPI equation. This calculation has not been validated in all clinical situations. eGFR's persistently <60 mL/min signify possible Chronic Kidney Disease.    Anion gap 10 5 - 15  Magnesium     Status: None   Collection Time: 10/27/17 10:35 AM  Result Value  Ref Range   Magnesium 2.0 1.7 - 2.4 mg/dL  Phosphorus     Status: None   Collection Time: 10/27/17 10:35 AM  Result Value Ref Range   Phosphorus 3.3 2.5 - 4.6 mg/dL  Glucose, capillary     Status: Abnormal   Collection Time: 10/27/17 11:27 AM  Result Value Ref Range   Glucose-Capillary 166 (H) 65 - 99 mg/dL  Glucose, capillary     Status: Abnormal   Collection Time: 10/27/17  5:09 PM  Result Value Ref Range   Glucose-Capillary 145 (H) 65 - 99 mg/dL  Glucose, capillary     Status: Abnormal   Collection Time: 10/27/17  9:25 PM  Result Value Ref Range   Glucose-Capillary 121 (H) 65 - 99 mg/dL  CBC     Status: Abnormal   Collection Time: 10/28/17  5:33 AM  Result Value Ref Range   WBC 8.8 4.0 - 10.5 K/uL   RBC 2.93 (L) 4.22 - 5.81 MIL/uL   Hemoglobin 9.1 (L) 13.0 - 17.0 g/dL   HCT 29.2 (L) 39.0 - 52.0 %   MCV 99.7 78.0 - 100.0 fL   MCH 31.1 26.0 - 34.0 pg   MCHC 31.2 30.0 - 36.0 g/dL   RDW 19.3 (H) 11.5 - 15.5 %   Platelets 290 150 - 400 K/uL  Basic metabolic panel     Status: Abnormal   Collection Time: 10/28/17  5:33 AM  Result Value Ref Range   Sodium 135 135 - 145 mmol/L   Potassium 3.9 3.5 - 5.1 mmol/L   Chloride 104 101 - 111 mmol/L   CO2 18 (L) 22 - 32 mmol/L   Glucose, Bld 114 (H) 65 - 99 mg/dL   BUN 48 (H) 6 - 20 mg/dL   Creatinine, Ser 2.29 (H) 0.61 - 1.24 mg/dL   Calcium 8.8 (L) 8.9 - 10.3 mg/dL   GFR calc non Af Amer 26 (L) >60 mL/min   GFR calc Af Amer 30 (L) >60 mL/min    Comment: (NOTE) The eGFR has been calculated using the CKD EPI equation. This calculation has not been validated in all clinical  situations. eGFR's persistently <60 mL/min signify possible Chronic Kidney Disease.    Anion gap 13 5 - 15  Glucose, capillary     Status: Abnormal   Collection Time: 10/28/17  8:07 AM  Result Value Ref Range   Glucose-Capillary 119 (H) 65 - 99 mg/dL  Glucose, capillary     Status: Abnormal   Collection Time: 10/28/17 11:50 AM  Result Value Ref Range   Glucose-Capillary 143 (H) 65 - 99 mg/dL    Dg Chest Port 1 View  Result Date: 10/27/2017 CLINICAL DATA:  Shortness of breath, respiratory failure, CHF, coronary artery disease, sepsis. EXAM: PORTABLE CHEST 1 VIEW COMPARISON:  Portable chest x-ray of October 25, 2017 FINDINGS: The lungs are adequately inflated. The interstitial markings are increased though stable. The cardiac silhouette is enlarged. The pulmonary vascularity is mildly engorged. There is calcification in the wall of the aortic arch. The ICD is in stable position. The trachea is midline. IMPRESSION: CHF with mild interstitial edema. No alveolar pneumonia nor pleural effusion. Thoracic aortic atherosclerosis. Electronically Signed   By: David  [place] M.D.   On: 10/27/2017 07:59    Review of Systems  Constitutional: Negative for weight loss.  HENT: Negative for ear discharge, ear pain, hearing loss and tinnitus.   Eyes: Negative for blurred vision, double vision, photophobia and pain.  Respiratory: Negative for cough,  sputum production and shortness of breath.   Cardiovascular: Negative for chest pain.  Gastrointestinal: Negative for abdominal pain, nausea and vomiting.  Genitourinary: Negative for dysuria, flank pain, frequency and urgency.  Musculoskeletal: Negative for back pain, falls, joint pain, myalgias and neck pain.  Neurological: Negative for dizziness, tingling, sensory change, focal weakness, loss of consciousness and headaches.  Endo/Heme/Allergies: Does not bruise/bleed easily.  Psychiatric/Behavioral: Negative for depression, memory loss and substance abuse.  The patient is not nervous/anxious.    Blood pressure 103/73, pulse (!) 114, temperature 98.2 F (36.8 Tyler), temperature source Oral, resp. rate 18, height '5\' 8"'  (1.727 m), weight 119.3 kg (263 lb 0.1 oz), SpO2 99 %. Physical Exam  Constitutional: He appears well-developed and well-nourished. No distress.  HENT:  Head: Normocephalic.  Eyes: Conjunctivae are normal. Right eye exhibits no discharge. Left eye exhibits no discharge. No scleral icterus.  Neck: Normal range of motion.  Cardiovascular: Normal rate and regular rhythm.  Respiratory: Effort normal. No respiratory distress.  Musculoskeletal:  LLE No traumatic wounds, ecchymosis, or rash  Nontender  No knee or ankle effusion  Knee stable to varus/ valgus and anterior/posterior stress  Sens DPN, SPN, TN intact  Motor EHL, ext, flex, evers 5/5  DP 0, PT 0, 4+ pitting edema             Plantar forefoot wound with black eschar, boggy, no TTP, no discharge, no odor  Neurological: He is alert.  Skin: Skin is warm and dry. He is not diaphoretic.  Psychiatric: He has a normal mood and affect. His behavior is normal.    Assessment/Plan: Left foot ulceration -- Will recheck x-rays to ensure no e/o osteo. Continue wound care per [facility] RN. Depending on progression of the wound the eschar may need to be debrided. He would also benefit from aggressive elevation of the limb and likely a compressive sock as well. Dr. Sharol Given to evaluate in the morning. Continue diet as ordered. No restrictions to activity or weightbearing.    Lisette Abu, PA-[facility] [phone] 10/28/2017, 1:06 PM

## 2017-10-28 NOTE — NC FL2 (Signed)
[place] MEDICAID FL2 LEVEL OF CARE SCREENING TOOL     IDENTIFICATION  Patient Name: [name] Birthdate: Jul 09, 1941 Sex: male Admission Date (Current Location): 10/23/2017  [facility] [facility] [place] Number:  Producer, television/film/video and Address:  [facility], [address] [place], [place] [address]      Provider Number: [id number]  Attending Physician Name and Address:  Darlin Drop, DO  Relative Name and Phone Number:  Sixto Bowdish, spouse, [phone]    Current Level of Care: Hospital Recommended Level of Care: Skilled Nursing Facility Prior Approval Number:    Date Approved/Denied:   PASRR Number: [id number]  Discharge Plan: SNF    Current Diagnoses: Patient Active Problem List   Diagnosis Date Noted  . Sepsis (HCC) 10/23/2017  . Septic shock (HCC)   . Acute cystitis without hematuria   . Atrial fibrillation with rapid ventricular response (HCC)   . Metabolic encephalopathy   . Pressure injury of skin 10/12/2017  . Respiratory failure (HCC)   . Acute blood loss anemia   . Hematemesis   . Diabetic foot infection (HCC) 09/27/2017  . Cellulitis of left foot   . Primary osteoarthritis of both hands 06/28/2017  . Primary osteoarthritis of both feet 06/28/2017  . Rheumatoid arthritis involving multiple sites with positive rheumatoid factor (HCC) 03/27/2017  . Pain in both hands 03/24/2017  . H/O total knee replacement, bilateral 03/24/2017  . Foot pain, left 03/24/2017  . High risk medication use 03/24/2017  . Other fatigue 03/24/2017  . History of hypothyroidism 03/24/2017  . Former smoker 03/24/2017  . PVD (peripheral vascular disease) with claudication (HCC) 07/11/2014  . TIA (transient ischemic attack) 05/30/2013  . Long term (current) use of anticoagulants 05/30/2013  . CKD (chronic kidney disease) stage 3, GFR 30-59 ml/min (HCC) 05/29/2013  . Paroxysmal atrial fibrillation (HCC)   . Cardiomyopathy, ischemic   . Biventricular  ICD (implantable cardioverter-defibrillator) in place 08/17/2012  . DM 05/15/2010  . Hypertensive cardiovascular disease 04/25/2010  . HYPERLIPIDEMIA-MIXED 08/28/2009  . CAD, NATIVE VESSEL 08/28/2009  . LBBB 08/28/2009  . SYSTOLIC HEART FAILURE, CHRONIC 08/28/2009    Orientation RESPIRATION BLADDER Height & Weight     Self, Time, Situation, Place  Normal Incontinent Weight: 119.3 kg (263 lb 0.1 oz) Height:  5\' 8"  (172.7 cm)  BEHAVIORAL SYMPTOMS/MOOD NEUROLOGICAL BOWEL NUTRITION STATUS      Continent Diet(Please see DC Summary)  AMBULATORY STATUS COMMUNICATION OF NEEDS Skin   Extensive Assist Verbally PU Stage and Appropriate Care(Stage II on sacrum; wound on foot)                       Personal Care Assistance Level of Assistance  Bathing, Feeding, Dressing Bathing Assistance: Maximum assistance Feeding assistance: Limited assistance Dressing Assistance: Maximum assistance     Functional Limitations Info  Sight, Hearing, Speech Sight Info: Adequate Hearing Info: Adequate Speech Info: Adequate    SPECIAL CARE FACTORS FREQUENCY  PT (By licensed PT), OT (By licensed OT)     PT Frequency: 5x/week OT Frequency: 3x/week            Contractures Contractures Info: Not present    Additional Factors Info  Code Status, Allergies, Insulin Sliding Scale Code Status Info: Full Allergies Info: Allegra Fexofenadine, Crestor Rosuvastatin Calcium, Codeine   Insulin Sliding Scale Info: insulin Q4 hrs       Current Medications (10/28/2017):  This is the current hospital active medication list Current Facility-Administered  Medications  Medication Dose Route Frequency Provider Last Rate Last Dose  . 0.9 %  sodium chloride infusion  250 mL Intravenous PRN Tobey Grim, NP   Stopped at 10/26/17 1346  . acetaminophen (TYLENOL) solution 650 mg  650 mg Oral Q6H PRN Tobey Grim, NP   650 mg at 10/23/17 2024  . carvedilol (COREG) tablet 6.25 mg  6.25 mg Oral BID WC  Wendall Stade, MD   6.25 mg at 10/28/17 1044  . ciprofloxacin (CIPRO) tablet 750 mg  750 mg Oral Q breakfast Oretha Milch, MD   750 mg at 10/28/17 1043  . clopidogrel (PLAVIX) tablet 75 mg  75 mg Oral Daily Leslye Peer, MD   75 mg at 10/28/17 1044  . collagenase (SANTYL) ointment   Topical Daily Sunny Schlein, MD      . heparin injection 5,000 Units  5,000 Units Subcutaneous Q8H Tobey Grim, NP   5,000 Units at 10/28/17 0617  . insulin aspart (novoLOG) injection 0-15 Units  0-15 Units Subcutaneous TID WC Leslye Peer, MD   2 Units at 10/27/17 1752  . insulin aspart (novoLOG) injection 0-5 Units  0-5 Units Subcutaneous QHS Leslye Peer, MD   Stopped at 10/27/17 2148  . insulin glargine (LANTUS) injection 5 Units  5 Units Subcutaneous BID Alyson Reedy, MD   5 Units at 10/28/17 1044  . isosorbide dinitrate (ISORDIL) tablet 10 mg  10 mg Oral TID Wendall Stade, MD   10 mg at 10/28/17 1044  . levothyroxine (SYNTHROID, LEVOTHROID) tablet 50 mcg  50 mcg Oral QAC breakfast Leslye Peer, MD   50 mcg at 10/28/17 1044  . pantoprazole (PROTONIX) EC tablet 40 mg  40 mg Oral QHS Link Snuffer B, RPH   40 mg at 10/27/17 2150  . protein supplement (PREMIER PROTEIN) liquid  11 oz Oral BID BM Hall, Carole N, DO   11 oz at 10/28/17 1045  . vancomycin (VANCOCIN) 1,500 mg in sodium chloride 0.9 % 500 mL IVPB  1,500 mg Intravenous Q48H Ilda Basset, [place]   Stopped at 10/28/17 1035     Discharge Medications: Please see discharge summary for a list of discharge medications.  Relevant Imaging Results:  Relevant Lab Results:   Additional Information SSN: [id number]  Mearl Latin, LCSWA

## 2017-10-28 NOTE — Consult Note (Signed)
WOC Nurse wound consult note Reason for Consult: non healing full thickness left foot wound from stepping on a tack Wound type:traumatic in a pt with neuropathic disease Pressure Injury POA: NA Measurement: larger area of 8cm x 6cm with a necrotic area 5cm x 4cm x 0.2cm in center. Wound bed: center is 100% boggy black eschar. Surrounding area is calloused, peeling, macerated intact skin. Drainage (amount, consistency, odor) scant (however dressing had just been changed right before my visit.) Periwound: intact Dressing procedure/placement/frequency: Continue with Santyl for enzymatic debriding. Eschar has changed from hardened shell to softer boggy eschar. Patient is to follow up with Vascular practice after discharge. Wife states she is working with CM or SW to arrange a facility where a [facility] MD can see them. (Recently at [facility] and could not be seen by their MDs.) MASD reassessed, wife educated on pressure vs MASD, IAD and care for each. Wife and pt educated on positioning leg above heart level with slight bend in knee. Left leg edematous, pt states was told not to wear SCD on that leg due to stent. Patient's nutritional status is poor, could benefit from supplements or Nutritional Consult for optimum wound healing, please order if you agree. We will not follow, but will remain available to this patient, to nursing, and the medical and/or surgical teams.  Please re-consult if we need to assist further.   Barnett Hatter, RN-C, WTA-C Wound Treatment Associate

## 2017-10-28 NOTE — Clinical Social Work Note (Signed)
Clinical Social Work Assessment  Patient Details  Name: [name] MRN: [id number] Date of Birth: 04-02-1941  Date of referral:  10/28/17               Reason for consult:  Facility Placement                Permission sought to share information with:  Family Supports, Magazine features editor Permission granted to share information::  Yes, Verbal Permission Granted  Name::     Celestine Bougie  Agency::  SNFs  Relationship::  Spouse  Contact Information:  [phone]  Housing/Transportation Living arrangements for the past 2 months:  Single Family Home, Skilled Nursing Facility Source of Information:  Patient, Spouse Patient Interpreter Needed:  None Criminal Activity/Legal Involvement Pertinent to Current Situation/Hospitalization:  No - Comment as needed Significant Relationships:  Spouse Lives with:  Spouse Do you feel safe going back to the place where you live?  No Need for family participation in patient care:  Yes (Comment)  Care giving concerns:  CSW received consult for possible SNF placement at time of discharge. CSW spoke with patient and his wife to further clarify discharge plans. Patient's wife states that patient is not able to come home yet, though he may want to. CSW to continue to follow and assist with discharge planning needs.   Social Worker assessment / plan:  CSW spoke with patient concerning possibility of rehab at [facility] before returning home.  Employment status:  Retired Database administrator PT Recommendations:  Skilled Nursing Facility Information / Referral to community resources:  Skilled Nursing Facility  Patient/Family's Response to care:  Patient recognizes need for rehab before returning home and is agreeable to a SNF in [place].  Patient/Family's Understanding of and Emotional Response to Diagnosis, Current Treatment, and Prognosis:  Patient/family is realistic regarding therapy needs and expressed being hopeful  for SNF placement. Patient expressed understanding of CSW role and discharge process as well as medical condition. No questions/concerns about plan or treatment.   Emotional Assessment Appearance:  Appears stated age Attitude/Demeanor/Rapport:  Other(Appropriate) Affect (typically observed):  Accepting, Appropriate Orientation:  Oriented to Self, Oriented to Situation, Oriented to Place, Oriented to  Time Alcohol / Substance use:  Not Applicable Psych involvement (Current and /or in the community):  No (Comment)  Discharge Needs  Concerns to be addressed:  Care Coordination Readmission within the last 30 days:  Yes Current discharge risk:  None Barriers to Discharge:  Continued Medical Work up   [employer], LCSWA 10/28/2017, 2:18 PM

## 2017-10-28 NOTE — Progress Notes (Signed)
CSW spoke with patient's wife. She stated that patient came to hospital from [facility]. She states that patient needs to go to rehab because she cannot help turn him at home. CSW will leave SNF list in the patient's room. She is requesting a SNF that allows patient's regular physicians to visit patient at the SNF.   Osborne Casco Brelee Renk LCSWA [phone]

## 2017-10-29 ENCOUNTER — Inpatient Hospital Stay (HOSPITAL_COMMUNITY): Payer: PPO

## 2017-10-29 DIAGNOSIS — T8149XA Infection following a procedure, other surgical site, initial encounter: Secondary | ICD-10-CM

## 2017-10-29 DIAGNOSIS — I739 Peripheral vascular disease, unspecified: Secondary | ICD-10-CM

## 2017-10-29 LAB — GLUCOSE, CAPILLARY
GLUCOSE-CAPILLARY: 156 mg/dL — AB (ref 65–99)
GLUCOSE-CAPILLARY: 163 mg/dL — AB (ref 65–99)
Glucose-Capillary: 130 mg/dL — ABNORMAL HIGH (ref 65–99)
Glucose-Capillary: 152 mg/dL — ABNORMAL HIGH (ref 65–99)

## 2017-10-29 LAB — BASIC METABOLIC PANEL
ANION GAP: 9 (ref 5–15)
BUN: 40 mg/dL — ABNORMAL HIGH (ref 6–20)
CALCIUM: 8.7 mg/dL — AB (ref 8.9–10.3)
CHLORIDE: 105 mmol/L (ref 101–111)
CO2: 21 mmol/L — AB (ref 22–32)
Creatinine, Ser: 2.23 mg/dL — ABNORMAL HIGH (ref 0.61–1.24)
GFR calc non Af Amer: 27 mL/min — ABNORMAL LOW (ref >60)
GFR, EST AFRICAN AMERICAN: 31 mL/min — AB (ref >60)
Glucose, Bld: 148 mg/dL — ABNORMAL HIGH (ref 65–99)
Potassium: 3.8 mmol/L (ref 3.5–5.1)
Sodium: 135 mmol/L (ref 135–145)

## 2017-10-29 LAB — TSH: TSH: 8.598 u[IU]/mL — ABNORMAL HIGH (ref 0.350–4.500)

## 2017-10-29 LAB — PROCALCITONIN: Procalcitonin: 0.15 ng/mL

## 2017-10-29 LAB — SEDIMENTATION RATE: SED RATE: 75 mm/h — AB (ref 0–16)

## 2017-10-29 LAB — C-REACTIVE PROTEIN: CRP: 6.9 mg/dL — ABNORMAL HIGH (ref <1.0)

## 2017-10-29 MED ORDER — TECHNETIUM TC 99M MEDRONATE IV KIT
25.0000 | PACK | Freq: Once | INTRAVENOUS | Status: AC | PRN
  Administered 2017-10-29: 25 via INTRAVENOUS

## 2017-10-29 MED ORDER — WARFARIN SODIUM 2.5 MG PO TABS: 2.5000 mg | ORAL_TABLET | ORAL | Status: DC

## 2017-10-29 MED FILL — Midazolam HCl Inj 5 MG/5ML (Base Equivalent): INTRAMUSCULAR | Qty: 5 | Status: AC

## 2017-10-29 NOTE — Progress Notes (Addendum)
PROGRESS NOTE  [name] IOM:[id number] DOB: 07/03/1941 DOA: 10/23/2017 PCP: [name], MD  HPI/Recap of past 24 hours: 76 diabetic, CAD, CKD3, ischemic cardiomyopathy status post AICD, PAF on coumadin, RA who was admitted on 10/23/17 with altered mental status, intubated for airway protection and transferred to [facility] for further workup. Coumadin held due to recent UGI bleed (09/2017).  No reported acute events overnight. POD#3 post left foot endovascular revascularization. Left foot with escar tissue present. Orthopedic surgery consulted to evaluate for possible debridement. Dr. Lajoyce Corners following. Will have bone scan done today to rule left foot osteomyelitis.  Assessment/Plan: Active Problems:   Respiratory failure (HCC)   Sepsis (HCC)   Septic shock (HCC)   Acute cystitis without hematuria   Atrial fibrillation with rapid ventricular response (HCC)   Metabolic encephalopathy  AMS, resolved -most likely 2/2 to sepsis  Recent septic shock 2/2 to LLE cellulitis/wound, poa -off pressors 10/26/17 -transferred to medicine team 10/27/17 -on IV vancomycin and po cipro day #3/5  LLE wound, poa -post endovascular revascularization POD #3 -vascular surgery following -orthopedic surgery consulted for possible debridement if indicated -Dr Lajoyce Corners following -wound care specialist consulted -rule out osteomyelitis -bone scan pending 10/29/17 -sed rate 75, crp 6.9(10/29/17) from 16.3 (10/14/17)  Ischemic CM with EF 25-30% (10/13/17) -cardiology following -appreciate recommendations -carvedilol, plavix, isordil -allergic to statin  Elevated troponin from demand ischemia -troponin trended down 1.32 (10/24/17) peaked at 3.42 (10/23/17) -cardiology following  Acute on Chronic HFREF 25-30% -management as stated above -allergic to statin  Severe PVD post POD#3 post left foot endovascular revascularization.  -vascular surgery following -we appreciate recs -plavix -reported  allergy to statin  AKI on CKD 3 -improving -cr 2.23 (10/29/17) from 2.29 from 2.42 from 2.75 -baseline cr 1.7 -avoid nephrotoxic agents/hypotension  Mild hyponatremia, resolved -Na+ 135 from 134 -possible hemodilutional -BMP am  Hypothyroidism -synthroid -tsh 8.5 -free T4 am  Recent GI bleed -Consult GI for recommendation on restarting oral anticoagulation -Hg stable -no reported acute GI bleed -The writer paged GI on call [phone]  Deconditioning -PT to evaluate and treat -PT has recommended SNF  Enlarged scrotum r/o hydrocele vs hernia -scrotum U/S 10/28/17:1. Diffuse scrotal wall thickening.  2. Small right hydrocele. Small left varicocele. No evidence of testicular mass or torsion . -patient denies scrotal pain at the time of this examination  Suspected urinary retention -bladder U/s has foley in   Code Status: Full  Family Communication: No family member at bedside  Disposition Plan: will stay another midnight to continue current management. Gen surgery will evaluate in the am   Consultants:  Cardiology  Vascular duergery  Procedures:  POD #1 post left foot endovascular revascularization.  Antimicrobials:  IV vancomycin  Po cipro day #1/5  DVT prophylaxis:  sq heparin 5000 U TID   Objective: Vitals:   10/28/17 1438 10/28/17 2134 10/29/17 0447 10/29/17 0518  BP: 121/80 124/78  134/85  Pulse: (!) 102 (!) 105  97  Resp: 18 20  16   Temp:  98.4 F (36.9 C)  98.4 F (36.9 C)  TempSrc:  Oral  Oral  SpO2: 98% 97%  97%  Weight:   117 kg (257 lb 15 oz)   Height:        Intake/Output Summary (Last 24 hours) at 10/29/2017 0833 Last data filed at 10/29/2017 0730 Gross per 24 hour  Intake -  Output 1225 ml  Net -1225 ml   Filed Weights   10/27/17 2122 10/28/17 0346 10/29/17 0447  Weight: 118 kg (260 lb 2.3 oz) 119.3 kg (263 lb 0.1 oz) 117 kg (257 lb 15 oz)    Exam:   General:  77 CM WD WN NAD   Cardiovascular: RRR no rubs  or gallops  Respiratory: CTA no wheezes or rhonchi  Abdomen: obese ND NT NBS x4 quadrants  Musculoskeletal: Escar tissue noted at plantar region of left foot below toes. 1/4 left dorsalis pedis pulse  Skin: as noted above  Psychiatry: mood is appropriate for condition and setting   Data Reviewed: CBC: Recent Labs  Lab 10/23/17 0523 10/24/17 0321 10/25/17 0452 10/28/17 0533  WBC 14.3* 13.1* 11.1* 8.8  HGB 8.6* 8.4* 7.8* 9.1*  HCT 27.2* 26.1* 24.4* 29.2*  MCV 96.5 96.3 99.2 99.7  PLT 339 250 255 290   Basic Metabolic Panel: Recent Labs  Lab 10/24/17 0321 10/25/17 0452 10/27/17 1035 10/28/17 0533 10/29/17 0208  NA 137 138 134* 135 135  K 3.2* 3.2* 3.8 3.9 3.8  CL 108 106 104 104 105  CO2 19* 22 20* 18* 21*  GLUCOSE 158* 106* 163* 114* 148*  BUN 72* 67* 52* 48* 40*  CREATININE 2.91* 2.75* 2.42* 2.29* 2.23*  CALCIUM 7.8* 8.2* 8.5* 8.8* 8.7*  MG 1.7 2.2 2.0  --   --   PHOS 3.4 3.3 3.3  --   --    GFR: Estimated Creatinine Clearance: 34.5 mL/min (A) (by C-G formula based on SCr of 2.23 mg/dL (H)). Liver Function Tests: Recent Labs  Lab 10/23/17 0523 10/26/17 1140 10/27/17 1035  AST 1,352* 363* 231*  ALT 543* 345* 295*  ALKPHOS 213* 195* 198*  BILITOT 1.0 0.6 0.7  PROT 6.3* 6.0* 5.6*  ALBUMIN 2.2* 2.0* 2.0*   No results for input(s): LIPASE, AMYLASE in the last 168 hours. No results for input(s): AMMONIA in the last 168 hours. Coagulation Profile: No results for input(s): INR, PROTIME in the last 168 hours. Cardiac Enzymes: Recent Labs  Lab 10/23/17 0523 10/23/17 1123 10/23/17 2249 10/24/17 0321 10/24/17 1123  TROPONINI 0.42* 2.81* 3.42* 2.33* 1.32*   BNP (last 3 results) No results for input(s): PROBNP in the last 8760 hours. HbA1C: No results for input(s): HGBA1C in the last 72 hours. CBG: Recent Labs  Lab 10/28/17 0807 10/28/17 1150 10/28/17 1656 10/28/17 2131 10/29/17 0751  GLUCAP 119* 143* 162* 185* 156*   Lipid Profile: No  results for input(s): CHOL, HDL, LDLCALC, TRIG, CHOLHDL, LDLDIRECT in the last 72 hours. Thyroid Function Tests: Recent Labs    10/29/17 0208  TSH 8.598*   Anemia Panel: No results for input(s): VITAMINB12, FOLATE, FERRITIN, TIBC, IRON, RETICCTPCT in the last 72 hours. Urine analysis:    Component Value Date/Time   COLORURINE AMBER (A) 10/12/2017 0450   APPEARANCEUR CLOUDY (A) 10/12/2017 0450   LABSPEC 1.024 10/12/2017 0450   PHURINE 5.0 10/12/2017 0450   GLUCOSEU NEGATIVE 10/12/2017 0450   HGBUR LARGE (A) 10/12/2017 0450   BILIRUBINUR NEGATIVE 10/12/2017 0450   KETONESUR 5 (A) 10/12/2017 0450   PROTEINUR 100 (A) 10/12/2017 0450   UROBILINOGEN 0.2 07/05/2014 2117   NITRITE NEGATIVE 10/12/2017 0450   LEUKOCYTESUR TRACE (A) 10/12/2017 0450   Sepsis Labs: @LABRCNTIP (procalcitonin:4,lacticidven:4)  ) Recent Results (from the past 240 hour(s))  Culture, blood (routine x 2)     Status: None   Collection Time: 10/23/17  6:12 AM  Result Value Ref Range Status   Specimen Description BLOOD LEFT HAND  Final   Special Requests IN PEDIATRIC BOTTLE Blood Culture adequate volume  Final   Culture NO GROWTH 5 DAYS  Final   Report Status 10/28/2017 FINAL  Final  Culture, blood (routine x 2)     Status: None   Collection Time: 10/23/17  6:41 AM  Result Value Ref Range Status   Specimen Description BLOOD LEFT THUMB  Final   Special Requests IN PEDIATRIC BOTTLE Blood Culture adequate volume  Final   Culture NO GROWTH 5 DAYS  Final   Report Status 10/28/2017 FINAL  Final  MRSA PCR Screening     Status: None   Collection Time: 10/23/17  7:00 AM  Result Value Ref Range Status   MRSA by PCR NEGATIVE NEGATIVE Final    Comment:        The GeneXpert MRSA Assay (FDA approved for NASAL specimens only), is one component of a comprehensive MRSA colonization surveillance program. It is not intended to diagnose MRSA infection nor to guide or monitor treatment for MRSA infections.   Culture,  respiratory (NON-Expectorated)     Status: None   Collection Time: 10/23/17  9:00 AM  Result Value Ref Range Status   Specimen Description TRACHEAL ASPIRATE  Final   Special Requests NONE  Final   Gram Stain   Final    RARE WBC PRESENT, PREDOMINANTLY PMN FEW GRAM POSITIVE COCCI IN PAIRS RARE GRAM NEGATIVE RODS RARE GRAM NEGATIVE COCCOBACILLI    Culture Consistent with normal respiratory flora.  Final   Report Status 10/25/2017 FINAL  Final  Urine Culture     Status: None   Collection Time: 10/23/17  5:35 PM  Result Value Ref Range Status   Specimen Description URINE, RANDOM  Final   Special Requests NONE  Final   Culture NO GROWTH  Final   Report Status 10/24/2017 FINAL  Final      Studies: US Scrotum  Result Date: 10/28/2017 CLINICAL DATA:  Bilateral full swelling. EXAM: SCROTAL ULTRASOUND DOPPLER ULTRASOUND OF THE TESTICLES TECHNIQUE: Complete ultrasound examination of the testicles, epididymis, and other scrotal structures was performed. Color and spectral Doppler ultrasound were also utilized to evaluate blood flow to the testicles. COMPARISON:  CT 06/13/2013. FINDINGS: Right testicle Measurements: 4.4 x 2.3 x 2.8 cm. No mass or microlithiasis visualized. Left testicle Measurements: 3.8 x 2.2 x 3.5 cm. No mass or microlithiasis visualized. Right epididymis:  Normal in size and appearance. Left epididymis:  Normal in size and appearance. Hydrocele:  Small right. Varicocele:  Small left. Pulsed Doppler interrogation of both testes demonstrates bilateral color Doppler flow. Diffuse scrotal wall thickening. No scrotal wall fluid collections are noted. IMPRESSION: 1. Diffuse scrotal wall thickening. 2. Small right hydrocele. Small left varicocele. No evidence of testicular mass or torsion . Electronically Signed   By: Maisie Fus  Register   On: 10/28/2017 15:42    Scheduled Meds: . carvedilol  6.25 mg Oral BID WC  . ciprofloxacin  750 mg Oral Q breakfast  . clopidogrel  75 mg Oral Daily    . collagenase   Topical Daily  . heparin  5,000 Units Subcutaneous Q8H  . insulin aspart  0-15 Units Subcutaneous TID WC  . insulin aspart  0-5 Units Subcutaneous QHS  . insulin glargine  5 Units Subcutaneous BID  . isosorbide dinitrate  10 mg Oral TID  . levothyroxine  50 mcg Oral QAC breakfast  . pantoprazole  40 mg Oral QHS  . protein supplement shake  11 oz Oral BID BM    Continuous Infusions: . sodium chloride Stopped (10/26/17 1346)  . vancomycin Stopped (  10/28/17 1035)     LOS: 6 days     Darlin Drop, MD [facility] Pager [phone]  If 7PM-7AM, please contact night-coverage [email or web address] Password [facility] 10/29/2017, 8:33 AM

## 2017-10-29 NOTE — Progress Notes (Signed)
PT Cancellation Note  Patient Details Name: [name] MRN: [id number] DOB: 07-03-1941   Cancelled Treatment:    Reason Eval/Treat Not Completed: Patient declined, no reason specified. Pt reported he is tired and had an MRI this am and is scheduled for a second one this PM. He is not up for PT today.   Kallie Locks, PTA Pager [phone] Acute Rehab   Sheral Apley 10/29/2017, 2:13 PM

## 2017-10-29 NOTE — Progress Notes (Signed)
Progress Note  Patient Name: [name] Date of Encounter: 10/29/2017  Primary Cardiologist: Nona Dell, MD  EP  Dr. Johney Frame  Subjective   No cardiac complaints foley in place now for some urinary retention Getting MRI on foot   Inpatient Medications    Scheduled Meds: . carvedilol  6.25 mg Oral BID WC  . ciprofloxacin  750 mg Oral Q breakfast  . clopidogrel  75 mg Oral Daily  . collagenase   Topical Daily  . heparin  5,000 Units Subcutaneous Q8H  . insulin aspart  0-15 Units Subcutaneous TID WC  . insulin aspart  0-5 Units Subcutaneous QHS  . insulin glargine  5 Units Subcutaneous BID  . isosorbide dinitrate  10 mg Oral TID  . levothyroxine  50 mcg Oral QAC breakfast  . pantoprazole  40 mg Oral QHS  . protein supplement shake  11 oz Oral BID BM   Continuous Infusions: . sodium chloride Stopped (10/26/17 1346)  . vancomycin Stopped (10/28/17 1035)   PRN Meds: sodium chloride, acetaminophen (TYLENOL) oral liquid 160 mg/5 mL   Vital Signs    Vitals:   10/28/17 1438 10/28/17 2134 10/29/17 0447 10/29/17 0518  BP: 121/80 124/78  134/85  Pulse: (!) 102 (!) 105  97  Resp: 18 20  16   Temp:  98.4 F (36.9 C)  98.4 F (36.9 C)  TempSrc:  Oral  Oral  SpO2: 98% 97%  97%  Weight:   257 lb 15 oz (117 kg)   Height:        Intake/Output Summary (Last 24 hours) at 10/29/2017 0852 Last data filed at 10/29/2017 0730 Gross per 24 hour  Intake -  Output 1225 ml  Net -1225 ml   Filed Weights   10/27/17 0619 10/28/17 0346 10/29/17 0447  Weight: 260 lb 2.3 oz (118 kg) 263 lb 0.1 oz (119.3 kg) 257 lb 15 oz (117 kg)    Telemetry    AV pacing intermit ant PAF - Personally Reviewed  ECG    AV pacing PAF LBBB   Physical Exam  Obese white male  GEN: No acute distress.    Neck: No JVD Cardiac: RRR, no murmurs, rubs, or gallops. AICD under left clavicle  Respiratory: Clear to auscultation bilaterally. GI: Soft, nontender, non-distended  MS: No edema; No  deformity. Neuro:  Nonfocal  Psych: Normal affect  Left foot wrapped with doppler able PT pulse per VVS  Foley   Labs    Chemistry Recent Labs  Lab 10/23/17 0523  10/26/17 1140 10/27/17 1035 10/28/17 0533 10/29/17 0208  NA 135   < >  --  134* 135 135  K 4.0   < >  --  3.8 3.9 3.8  CL 100*   < >  --  104 104 105  CO2 19*   < >  --  20* 18* 21*  GLUCOSE 309*   < >  --  163* 114* 148*  BUN 64*   < >  --  52* 48* 40*  CREATININE 3.28*   < >  --  2.42* 2.29* 2.23*  CALCIUM 8.2*   < >  --  8.5* 8.8* 8.7*  PROT 6.3*  --  6.0* 5.6*  --   --   ALBUMIN 2.2*  --  2.0* 2.0*  --   --   AST 1,352*  --  363* 231*  --   --   ALT 543*  --  345* 295*  --   --  ALKPHOS 213*  --  195* 198*  --   --   BILITOT 1.0  --  0.6 0.7  --   --   GFRNONAA 17*   < >  --  24* 26* 27*  GFRAA 19*   < >  --  28* 30* 31*  ANIONGAP 16*   < >  --  10 13 9    < > = values in this interval not displayed.     Hematology Recent Labs  Lab 10/24/17 0321 10/25/17 0452 10/28/17 0533  WBC 13.1* 11.1* 8.8  RBC 2.71* 2.46* 2.93*  HGB 8.4* 7.8* 9.1*  HCT 26.1* 24.4* 29.2*  MCV 96.3 99.2 99.7  MCH 31.0 31.7 31.1  MCHC 32.2 32.0 31.2  RDW 17.5* 18.2* 19.3*  PLT 250 255 290    Cardiac Enzymes Recent Labs  Lab 10/23/17 1123 10/23/17 2249 10/24/17 0321 10/24/17 1123  TROPONINI 2.81* 3.42* 2.33* 1.32*   No results for input(s): TROPIPOC in the last 168 hours.   BNPNo results for input(s): BNP, PROBNP in the last 168 hours.   DDimer No results for input(s): DDIMER in the last 168 hours.   Radiology    US Scrotum  Result Date: 10/28/2017 CLINICAL DATA:  Bilateral full swelling. EXAM: SCROTAL ULTRASOUND DOPPLER ULTRASOUND OF THE TESTICLES TECHNIQUE: Complete ultrasound examination of the testicles, epididymis, and other scrotal structures was performed. Color and spectral Doppler ultrasound were also utilized to evaluate blood flow to the testicles. COMPARISON:  CT 06/13/2013. FINDINGS: Right testicle  Measurements: 4.4 x 2.3 x 2.8 cm. No mass or microlithiasis visualized. Left testicle Measurements: 3.8 x 2.2 x 3.5 cm. No mass or microlithiasis visualized. Right epididymis:  Normal in size and appearance. Left epididymis:  Normal in size and appearance. Hydrocele:  Small right. Varicocele:  Small left. Pulsed Doppler interrogation of both testes demonstrates bilateral color Doppler flow. Diffuse scrotal wall thickening. No scrotal wall fluid collections are noted. IMPRESSION: 1. Diffuse scrotal wall thickening. 2. Small right hydrocele. Small left varicocele. No evidence of testicular mass or torsion . Electronically Signed   By: Maisie Fus  Register   On: 10/28/2017 15:42    Cardiac Studies   ECHO 10/13/17 Study Conclusions  - Left ventricle: Diffuse hypokinesis worse in inferior wall and   abnormal septal motion The cavity size was moderately dilated.   Wall thickness was increased in a pattern of severe LVH. Systolic   function was severely reduced. The estimated ejection fraction   was in the range of 25% to 30%. Doppler parameters are consistent   with abnormal left ventricular relaxation (grade 1 diastolic   dysfunction). - Mitral valve: Calcified annulus. Mildly thickened leaflets .   There was mild regurgitation. - Atrial septum: No defect or patent foramen ovale was identified.   Patient Profile     76 y.o. male admitted with altered mental status and respiratory failureHas BiV AICD and PAF, elevated troponin PAF.     Assessment & Plan    Atrial fib - AV pacing now PaF resume coumadin per GI   CAD with elevated troponin form demand ischemia no chest pain, no plans for ischemic eval unless he becomes symptomatic on plavix and beta blocker   Respiratory failure extubated and CXR today with CHF mild interstitial edema no PNA, no pl. Effusion.  + thoracic aortic atherosclerosis  Continue antibiotics    CHF compensated ACE and demedex on hold Cr slowly improving 2.29  yesterday   ICD Normal function per GT  PAF:  On beta blocker resume coumadin when ok with GI no active bleeding   PVD:  Seen by VVS thinks graft open 1 vessel run off slow to heal but stable  Getting MRI to r/o osteomyelitis continue antibiotics   Patient would be better served by SNF but he wants to go home   [employer]

## 2017-10-30 ENCOUNTER — Inpatient Hospital Stay (HOSPITAL_COMMUNITY): Payer: PPO

## 2017-10-30 LAB — GLUCOSE, CAPILLARY
GLUCOSE-CAPILLARY: 134 mg/dL — AB (ref 65–99)
GLUCOSE-CAPILLARY: 170 mg/dL — AB (ref 65–99)
Glucose-Capillary: 169 mg/dL — ABNORMAL HIGH (ref 65–99)
Glucose-Capillary: 126 mg/dL — ABNORMAL HIGH (ref 65–99)

## 2017-10-30 MED ORDER — TRAZODONE HCL 50 MG PO TABS
50.0000 mg | ORAL_TABLET | Freq: Once | ORAL | Status: AC
  Administered 2017-10-30: 50 mg via ORAL
  Filled 2017-10-30: qty 1

## 2017-10-30 NOTE — Progress Notes (Signed)
PROGRESS NOTE  [name] WKM:[id number] DOB: Apr 07, 1941 DOA: 10/23/2017 PCP: [name], MD  HPI/Recap of past 24 hours: 76 diabetic, CAD, CKD3, ischemic cardiomyopathy status post AICD, PAF on coumadin, RA who was admitted on 10/23/17 with altered mental status, intubated for airway protection and transferred to [facility] for further workup. Coumadin held due to recent UGI bleed (09/2017).  No reported acute events overnight. POD#3 post left foot endovascular revascularization. Left foot with escar tissue present. Orthopedic surgery consulted to evaluate for possible debridement. Dr. Lajoyce Corners following. Will have bone scan done today to rule left foot osteomyelitis.  Bone scan 10/29/17 left foot: 1. Abnormal three-phase bone scan with increased perfusion, blood pool and delayed phase activity in the left forefoot. This appears to localize to the first and second proximal phalangeal bases on the delayed images and is suspicious for osteomyelitis. Consider follow-up CT for confirmation. 2. Delayed phase activity at the Lisfranc joint bilaterally, likely Arthropathic.  Order f/u CT left foot.   Pt seen and examined with no family member at his bedside. He denies any pain. Has no complaints.   Assessment/Plan: Active Problems:   Respiratory failure (HCC)   Sepsis (HCC)   Septic shock (HCC)   Acute cystitis without hematuria   Atrial fibrillation with rapid ventricular response (HCC)   Metabolic encephalopathy  AMS, resolved -most likely 2/2 to sepsis  Recent septic shock 2/2 to LLE cellulitis/wound, poa -off pressors 10/26/17 -transferred to medicine team 10/27/17 -completed IV vancomycin and po cipro  LLE wound, poa -post endovascular revascularization POD #4 -vascular surgery following -orthopedic surgery consulted for possible debridement if indicated -Dr Lajoyce Corners following -wound care specialist consulted -rule out osteomyelitis -bone scan 10/29/17 revealed suspected  osteomyelitis -sed rate 75, crp 6.9(10/29/17) from 16.3 (10/14/17) -CT foot to confirm osteomyelitis 10/30/17  Ischemic CM with EF 25-30% (10/13/17) -cardiology following -appreciate recommendations -carvedilol, plavix, isordil -allergic to statin  Elevated troponin from demand ischemia -troponin trended down 1.32 (10/24/17) peaked at 3.42 (10/23/17) -cardiology following -No chest pain  Acute on Chronic HFREF 25-30% -management as stated above -allergic to statin  Severe PVD post POD#4 post left foot endovascular revascularization.  -vascular surgery following -we appreciate recs -plavix -reported allergy to statin  AKI on CKD 3 -improving -cr 2.23 (10/29/17) from 2.29 from 2.42 from 2.75 -baseline cr 1.7 -avoid nephrotoxic agents/hypotension  Mild hyponatremia, resolved -resolved -Na+ 135 from 134 -possible hemodilutional -BMP am  Hypothyroidism -synthroid -tsh 8.5 -free T4 am  Recent GI bleed -Consult GI for recommendation on restarting oral anticoagulation -Hg stable -no reported acute GI bleed -The writer paged GI on call [phone]  Deconditioning -PT to evaluate and treat -PT has recommended SNF  Enlarged scrotum r/o hydrocele vs hernia -scrotum U/S 10/28/17:1. Diffuse scrotal wall thickening.  2. Small right hydrocele. Small left varicocele. No evidence of testicular mass or torsion . -patient denies scrotal pain at the time of this examination  Suspected urinary retention -bladder U/s has foley in   Code Status: Full  Family Communication: No family member at bedside  Disposition Plan: will stay another midnight to continue current management. Gen surgery will evaluate in the am   Consultants:  Cardiology  Vascular duergery  Procedures:  POD #4 post left foot endovascular revascularization.  Antimicrobials: None  DVT prophylaxis:  sq heparin 5000 U TID   Objective: Vitals:   10/29/17 1318 10/29/17 2211 10/30/17 0018  10/30/17 0504  BP: 128/75 126/68  136/80  Pulse: (!) 104 92  88  Resp: 18 16  16   Temp: 98.5 F (36.9 C) 98.1 F (36.7 C)  (!) 97.5 F (36.4 C)  TempSrc: Oral     SpO2: 98% 96%  95%  Weight:   117.3 kg (258 lb 9.6 oz)   Height:        Intake/Output Summary (Last 24 hours) at 10/30/2017 0853 Last data filed at 10/30/2017 0016 Gross per 24 hour  Intake 720 ml  Output 250 ml  Net 470 ml   Filed Weights   10/28/17 0346 10/29/17 0447 10/30/17 0018  Weight: 119.3 kg (263 lb 0.1 oz) 117 kg (257 lb 15 oz) 117.3 kg (258 lb 9.6 oz)    Exam:   General:  77 CM WD WN NAD   Cardiovascular: RRR no rubs or gallops  Respiratory: CTA no wheezes or rhonchi  Abdomen: obese ND NT NBS x4 quadrants  Musculoskeletal: Escar tissue noted at plantar region of left foot below toes. 1/4 left dorsalis pedis pulse  Skin: as noted above  Psychiatry: mood is appropriate for condition and setting   Data Reviewed: CBC: Recent Labs  Lab 10/24/17 0321 10/25/17 0452 10/28/17 0533  WBC 13.1* 11.1* 8.8  HGB 8.4* 7.8* 9.1*  HCT 26.1* 24.4* 29.2*  MCV 96.3 99.2 99.7  PLT 250 255 290   Basic Metabolic Panel: Recent Labs  Lab 10/24/17 0321 10/25/17 0452 10/27/17 1035 10/28/17 0533 10/29/17 0208  NA 137 138 134* 135 135  K 3.2* 3.2* 3.8 3.9 3.8  CL 108 106 104 104 105  CO2 19* 22 20* 18* 21*  GLUCOSE 158* 106* 163* 114* 148*  BUN 72* 67* 52* 48* 40*  CREATININE 2.91* 2.75* 2.42* 2.29* 2.23*  CALCIUM 7.8* 8.2* 8.5* 8.8* 8.7*  MG 1.7 2.2 2.0  --   --   PHOS 3.4 3.3 3.3  --   --    GFR: Estimated Creatinine Clearance: 34.5 mL/min (A) (by C-G formula based on SCr of 2.23 mg/dL (H)). Liver Function Tests: Recent Labs  Lab 10/26/17 1140 10/27/17 1035  AST 363* 231*  ALT 345* 295*  ALKPHOS 195* 198*  BILITOT 0.6 0.7  PROT 6.0* 5.6*  ALBUMIN 2.0* 2.0*   No results for input(s): LIPASE, AMYLASE in the last 168 hours. No results for input(s): AMMONIA in the last 168  hours. Coagulation Profile: No results for input(s): INR, PROTIME in the last 168 hours. Cardiac Enzymes: Recent Labs  Lab 10/23/17 1123 10/23/17 2249 10/24/17 0321 10/24/17 1123  TROPONINI 2.81* 3.42* 2.33* 1.32*   BNP (last 3 results) No results for input(s): PROBNP in the last 8760 hours. HbA1C: No results for input(s): HGBA1C in the last 72 hours. CBG: Recent Labs  Lab 10/29/17 0751 10/29/17 1136 10/29/17 1750 10/29/17 2207 10/30/17 0804  GLUCAP 156* 163* 130* 152* 134*   Lipid Profile: No results for input(s): CHOL, HDL, LDLCALC, TRIG, CHOLHDL, LDLDIRECT in the last 72 hours. Thyroid Function Tests: Recent Labs    10/29/17 0208  TSH 8.598*   Anemia Panel: No results for input(s): VITAMINB12, FOLATE, FERRITIN, TIBC, IRON, RETICCTPCT in the last 72 hours. Urine analysis:    Component Value Date/Time   COLORURINE AMBER (A) 10/12/2017 0450   APPEARANCEUR CLOUDY (A) 10/12/2017 0450   LABSPEC 1.024 10/12/2017 0450   PHURINE 5.0 10/12/2017 0450   GLUCOSEU NEGATIVE 10/12/2017 0450   HGBUR LARGE (A) 10/12/2017 0450   BILIRUBINUR NEGATIVE 10/12/2017 0450   KETONESUR 5 (A) 10/12/2017 0450   PROTEINUR 100 (A) 10/12/2017 0450  UROBILINOGEN 0.2 07/05/2014 2117   NITRITE NEGATIVE 10/12/2017 0450   LEUKOCYTESUR TRACE (A) 10/12/2017 0450   Sepsis Labs: @LABRCNTIP (procalcitonin:4,lacticidven:4)  ) Recent Results (from the past 240 hour(s))  Culture, blood (routine x 2)     Status: None   Collection Time: 10/23/17  6:12 AM  Result Value Ref Range Status   Specimen Description BLOOD LEFT HAND  Final   Special Requests IN PEDIATRIC BOTTLE Blood Culture adequate volume  Final   Culture NO GROWTH 5 DAYS  Final   Report Status 10/28/2017 FINAL  Final  Culture, blood (routine x 2)     Status: None   Collection Time: 10/23/17  6:41 AM  Result Value Ref Range Status   Specimen Description BLOOD LEFT THUMB  Final   Special Requests IN PEDIATRIC BOTTLE Blood Culture  adequate volume  Final   Culture NO GROWTH 5 DAYS  Final   Report Status 10/28/2017 FINAL  Final  MRSA PCR Screening     Status: None   Collection Time: 10/23/17  7:00 AM  Result Value Ref Range Status   MRSA by PCR NEGATIVE NEGATIVE Final    Comment:        The GeneXpert MRSA Assay (FDA approved for NASAL specimens only), is one component of a comprehensive MRSA colonization surveillance program. It is not intended to diagnose MRSA infection nor to guide or monitor treatment for MRSA infections.   Culture, respiratory (NON-Expectorated)     Status: None   Collection Time: 10/23/17  9:00 AM  Result Value Ref Range Status   Specimen Description TRACHEAL ASPIRATE  Final   Special Requests NONE  Final   Gram Stain   Final    RARE WBC PRESENT, PREDOMINANTLY PMN FEW GRAM POSITIVE COCCI IN PAIRS RARE GRAM NEGATIVE RODS RARE GRAM NEGATIVE COCCOBACILLI    Culture Consistent with normal respiratory flora.  Final   Report Status 10/25/2017 FINAL  Final  Urine Culture     Status: None   Collection Time: 10/23/17  5:35 PM  Result Value Ref Range Status   Specimen Description URINE, RANDOM  Final   Special Requests NONE  Final   Culture NO GROWTH  Final   Report Status 10/24/2017 FINAL  Final      Studies: Nm Bone Scan 3 Phase  Result Date: 10/29/2017 CLINICAL DATA:  Left foot pain and swelling after plantar puncture wound approximately 2 months ago. Evaluate for osteomyelitis. Unable to undergo MRI. EXAM: NUCLEAR MEDICINE 3-PHASE BONE SCAN TECHNIQUE: Radionuclide angiographic images, immediate static blood pool images, and 3-hour delayed static images were obtained of the feet and distal lower legs after intravenous injection of radiopharmaceutical. RADIOPHARMACEUTICALS:  21.3 mCi Tc-79m MDP COMPARISON:  Left foot radiographs 10/03/2017.  CT 09/28/2017. FINDINGS: Vascular phase: There is asymmetrically increased perfusion to the left foot relative to the right. Blood pool phase:  There is prominent asymmetric blood pool activity within the left forefoot medially. Delayed phase: Delayed images demonstrate focal activity within the bases of the first and second proximal phalanges, adjacent to the first web space. There is bilateral midfoot activity associated with the Lisfranc joints, likely arthropathic. IMPRESSION: 1. Abnormal three-phase bone scan with increased perfusion, blood pool and delayed phase activity in the left forefoot. This appears to localize to the first and second proximal phalangeal bases on the delayed images and is suspicious for osteomyelitis. Consider follow-up CT for confirmation. 2. Delayed phase activity at the Lisfranc joint bilaterally, likely arthropathic. Electronically Signed   By: Chrissie Noa  Purcell Mouton M.D.   On: 10/29/2017 16:46    Scheduled Meds: . carvedilol  6.25 mg Oral BID WC  . clopidogrel  75 mg Oral Daily  . collagenase   Topical Daily  . heparin  5,000 Units Subcutaneous Q8H  . insulin aspart  0-15 Units Subcutaneous TID WC  . insulin aspart  0-5 Units Subcutaneous QHS  . insulin glargine  5 Units Subcutaneous BID  . isosorbide dinitrate  10 mg Oral TID  . levothyroxine  50 mcg Oral QAC breakfast  . pantoprazole  40 mg Oral QHS  . protein supplement shake  11 oz Oral BID BM    Continuous Infusions: . sodium chloride Stopped (10/26/17 1346)     LOS: 7 days     Darlin Drop, MD [facility] Pager [phone]  If 7PM-7AM, please contact night-coverage [email or web address] Password [facility] 10/30/2017, 8:53 AM

## 2017-10-30 NOTE — Plan of Care (Signed)
  Education: Knowledge of General Education information will improve 10/30/2017 0501 - Progressing by Olena Mater, RN Note POC reviewed with pt.

## 2017-10-31 DIAGNOSIS — Z833 Family history of diabetes mellitus: Secondary | ICD-10-CM

## 2017-10-31 DIAGNOSIS — L97429 Non-pressure chronic ulcer of left heel and midfoot with unspecified severity: Secondary | ICD-10-CM

## 2017-10-31 DIAGNOSIS — L97529 Non-pressure chronic ulcer of other part of left foot with unspecified severity: Secondary | ICD-10-CM | POA: Diagnosis present

## 2017-10-31 DIAGNOSIS — M0579 Rheumatoid arthritis with rheumatoid factor of multiple sites without organ or systems involvement: Secondary | ICD-10-CM

## 2017-10-31 DIAGNOSIS — Z95820 Peripheral vascular angioplasty status with implants and grafts: Secondary | ICD-10-CM

## 2017-10-31 DIAGNOSIS — I2510 Atherosclerotic heart disease of native coronary artery without angina pectoris: Secondary | ICD-10-CM

## 2017-10-31 DIAGNOSIS — Z888 Allergy status to other drugs, medicaments and biological substances status: Secondary | ICD-10-CM

## 2017-10-31 DIAGNOSIS — Z8249 Family history of ischemic heart disease and other diseases of the circulatory system: Secondary | ICD-10-CM

## 2017-10-31 DIAGNOSIS — E785 Hyperlipidemia, unspecified: Secondary | ICD-10-CM

## 2017-10-31 DIAGNOSIS — Z87891 Personal history of nicotine dependence: Secondary | ICD-10-CM

## 2017-10-31 DIAGNOSIS — E11621 Type 2 diabetes mellitus with foot ulcer: Secondary | ICD-10-CM

## 2017-10-31 DIAGNOSIS — Z8619 Personal history of other infectious and parasitic diseases: Secondary | ICD-10-CM

## 2017-10-31 DIAGNOSIS — I5022 Chronic systolic (congestive) heart failure: Secondary | ICD-10-CM

## 2017-10-31 DIAGNOSIS — E1151 Type 2 diabetes mellitus with diabetic peripheral angiopathy without gangrene: Secondary | ICD-10-CM

## 2017-10-31 DIAGNOSIS — Z885 Allergy status to narcotic agent status: Secondary | ICD-10-CM

## 2017-10-31 DIAGNOSIS — L97521 Non-pressure chronic ulcer of other part of left foot limited to breakdown of skin: Secondary | ICD-10-CM

## 2017-10-31 LAB — GLUCOSE, CAPILLARY
GLUCOSE-CAPILLARY: 139 mg/dL — AB (ref 65–99)
GLUCOSE-CAPILLARY: 135 mg/dL — AB (ref 65–99)
Glucose-Capillary: 119 mg/dL — ABNORMAL HIGH (ref 65–99)
Glucose-Capillary: 111 mg/dL — ABNORMAL HIGH (ref 65–99)

## 2017-10-31 MED ORDER — HEPARIN (PORCINE) IN NACL 100-0.45 UNIT/ML-% IJ SOLN
1700.0000 [IU]/h | INTRAMUSCULAR | Status: DC
  Administered 2017-10-31: 1300 [IU]/h via INTRAVENOUS
  Administered 2017-11-01 – 2017-11-02 (×2): 1450 [IU]/h via INTRAVENOUS
  Administered 2017-11-03: 1700 [IU]/h via INTRAVENOUS
  Filled 2017-10-31 (×4): qty 250

## 2017-10-31 NOTE — Consult Note (Signed)
[facility]    Date of Admission:  10/23/2017          Reason for Consult: Ischemic, plantar ulcer of left foot    Referring Provider: Dr. Raphael Gibney  Assessment: I suspect that his ulcer is related to recent ischemia and hope that will slowly heal after his recent revascularization intervention. I do not think that there is any active infection that requires further antibiotic therapy.   Plan: 1. Observe off of antibiotics 2. Please call if we can be of further assistance   Active Problems:   Plantar ulcer of left foot (HCC)   Diabetes mellitus (HCC)   Dyslipidemia   CAD, NATIVE VESSEL   SYSTOLIC HEART FAILURE, CHRONIC   Biventricular ICD (implantable cardioverter-defibrillator) in place   Cardiomyopathy, ischemic   Paroxysmal atrial fibrillation (HCC)   CKD (chronic kidney disease) stage 3, GFR 30-59 ml/min (HCC)   PVD (peripheral vascular disease) with claudication (HCC)   Rheumatoid arthritis involving multiple sites with positive rheumatoid factor (HCC)   Scheduled Meds: . carvedilol  6.25 mg Oral BID WC  . clopidogrel  75 mg Oral Daily  . collagenase   Topical Daily  . heparin  5,000 Units Subcutaneous Q8H  . insulin aspart  0-15 Units Subcutaneous TID WC  . insulin aspart  0-5 Units Subcutaneous QHS  . insulin glargine  5 Units Subcutaneous BID  . isosorbide dinitrate  10 mg Oral TID  . levothyroxine  50 mcg Oral QAC breakfast  . pantoprazole  40 mg Oral QHS  . protein supplement shake  11 oz Oral BID BM   Continuous Infusions: . sodium chloride Stopped (10/26/17 1346)   PRN Meds:.sodium chloride, acetaminophen (TYLENOL) oral liquid 160 mg/5 mL  HPI: [name] is a 76 y.o. male with diabetes and peripheral artery disease. He stepped on tack in late October. He developed soft tissue infection of his left foot and was admitted to the hospital on 09/27/2017. He received IV vancomycin and piperacillin tazobactam. He  underwent atherectomy and angioplasty of his left femoral and popliteal arteries on 10/12/2017. He was discharged to a skilled nursing facility on oral cephalexin on 10/15/2017. He was readmitted with sepsis and acute respiratory failure on 10/23/2017. It seems as though the source of the sepsis was not entirely clear. He received broad empiric antibiotic therapy until 10/29/2017.    he has a persistent ulcer on the plantar surface of his left foot. It was debrided by Dr. Lajoyce Corners 3 days ago. There was a question of osteomyelitis of the proximal phalanges of his left first and second toes on bone scan but no evidence of deep abscess or bone infection was noted on a CT scan.   Review of Systems: Review of Systems  Constitutional: Negative for chills, diaphoresis and fever.  Musculoskeletal: Negative for joint pain.       His foot is insensate.  Neurological: Positive for sensory change.    Past Medical History:  Diagnosis Date  . AICD (automatic cardioverter/defibrillator) present   . Anemia   . Anemia-chronic    a. Pt reports h/o anemia - was offered IV iron in past by nephrologist but declined and has taken PO instead.  . Aneurysm, cerebral   . Arthritis    bilateral wrist and fingers  . BPH (benign prostatic hypertrophy)   . Cardiomyopathy, ischemic    LVEF 25-35%.  . Chronic kidney disease, stage III (moderate) (HCC)  Followed by Dr. Fausto Skillern.  . Coronary atherosclerosis of native coronary artery    a. DES to RCA 03/2009. b. s/p PTCA to RCA for ISR, 05/2010. c. 03/2013 NSTEMI 2/2 severe prox RCA s/p PTCA/DES, med rx for residual dz.  . Cystitis 10/2017  . Essential hypertension, benign   . Hyperlipidemia   . Hypothyroidism   . LBBB (left bundle branch block)    s/p BiV ICD Implanted by Dr Graciela Husbands  . Morbid obesity (HCC)   . MRSA (methicillin resistant Staphylococcus aureus)   . Myocardial infarction (HCC)   . Paroxysmal atrial fibrillation (HCC)    a. Coumadin discontinue in 2010  when the patient required ASA/Plavix for stenting. b. Coumadin restarted 05/2013, Plavix continued, ASA stopped.   . Pneumonia    years ago 10  . Pulmonary nodule    CXR, 03/2013, [facility] - not described on any subsequent chest x-rays however, could be a vascular structure  . TIA (transient ischemic attack)    Multiple TIAs  . Type 2 diabetes mellitus (HCC)     Social History   Tobacco Use  . Smoking status: Former Smoker    Packs/day: 1.00    Years: 6.00    Pack years: 6.00    Types: Cigarettes    Start date: 12/08/1949    Last attempt to quit: 11/16/1964    Years since quitting: 52.9  . Smokeless tobacco: Former Neurosurgeon    Quit date: 04/15/1965  Substance Use Topics  . Alcohol use: Yes    Alcohol/week: 0.0 oz    Comment: once/month  . Drug use: No    Family History  Problem Relation Age of Onset  . Diabetes Mother        Bilateral leg amputation  . Heart disease Mother        Before age 32  . Hypertension Mother   . Liver disease Mother        fatty liver   . Cancer Father        Prostate and Bone  . Diabetes Father   . Cancer Brother        Prostate  . Heart disease Other        family h/o premature cardiovascular disease  . Cancer Sister        Colon   . Hypertension Sister    Allergies  Allergen Reactions  . Allegra [Fexofenadine] Itching  . Crestor [Rosuvastatin Calcium] Other (See Comments)    Body aches  . Codeine Other (See Comments)    blisters, but able to take hydrocodone    OBJECTIVE: Blood pressure 127/80, pulse 80, temperature (!) 97.5 F (36.4 C), temperature source Oral, resp. rate 16, height 5\' 8"  (1.727 m), weight 265 lb 3.4 oz (120.3 kg), SpO2 95 %.  Physical Exam  Constitutional: He is oriented to person, place, and time.  His resting quietly in bed.  Musculoskeletal:  He has an irregularly shaped superficial ulcer on the plantar surface of his left foot. It is about 4 cm in diameter. Dry black eschar covers the entire base of the ulcer.  There is no surrounding cellulitis. There is no malodor.  Neurological: He is alert and oriented to person, place, and time.  Psychiatric: Mood and affect normal.    Lab Results Lab Results  Component Value Date   WBC 8.8 10/28/2017   HGB 9.1 (L) 10/28/2017   HCT 29.2 (L) 10/28/2017   MCV 99.7 10/28/2017   PLT 290 10/28/2017    Lab Results  Component Value Date   CREATININE 2.23 (H) 10/29/2017   BUN 40 (H) 10/29/2017   NA 135 10/29/2017   K 3.8 10/29/2017   CL 105 10/29/2017   CO2 21 (L) 10/29/2017    Lab Results  Component Value Date   ALT 295 (H) 10/27/2017   AST 231 (H) 10/27/2017   ALKPHOS 198 (H) 10/27/2017   BILITOT 0.7 10/27/2017     Microbiology: Recent Results (from the past 240 hour(s))  Culture, blood (routine x 2)     Status: None   Collection Time: 10/23/17  6:12 AM  Result Value Ref Range Status   Specimen Description BLOOD LEFT HAND  Final   Special Requests IN PEDIATRIC BOTTLE Blood Culture adequate volume  Final   Culture NO GROWTH 5 DAYS  Final   Report Status 10/28/2017 FINAL  Final  Culture, blood (routine x 2)     Status: None   Collection Time: 10/23/17  6:41 AM  Result Value Ref Range Status   Specimen Description BLOOD LEFT THUMB  Final   Special Requests IN PEDIATRIC BOTTLE Blood Culture adequate volume  Final   Culture NO GROWTH 5 DAYS  Final   Report Status 10/28/2017 FINAL  Final  MRSA PCR Screening     Status: None   Collection Time: 10/23/17  7:00 AM  Result Value Ref Range Status   MRSA by PCR NEGATIVE NEGATIVE Final    Comment:        The GeneXpert MRSA Assay (FDA approved for NASAL specimens only), is one component of a comprehensive MRSA colonization surveillance program. It is not intended to diagnose MRSA infection nor to guide or monitor treatment for MRSA infections.   Culture, respiratory (NON-Expectorated)     Status: None   Collection Time: 10/23/17  9:00 AM  Result Value Ref Range Status   Specimen Description  TRACHEAL ASPIRATE  Final   Special Requests NONE  Final   Gram Stain   Final    RARE WBC PRESENT, PREDOMINANTLY PMN FEW GRAM POSITIVE COCCI IN PAIRS RARE GRAM NEGATIVE RODS RARE GRAM NEGATIVE COCCOBACILLI    Culture Consistent with normal respiratory flora.  Final   Report Status 10/25/2017 FINAL  Final  Urine Culture     Status: None   Collection Time: 10/23/17  5:35 PM  Result Value Ref Range Status   Specimen Description URINE, RANDOM  Final   Special Requests NONE  Final   Culture NO GROWTH  Final   Report Status 10/24/2017 FINAL  Final    Cliffton Asters, MD [facility] [phone] pager   [phone] cell 10/31/2017, 3:43 PM

## 2017-10-31 NOTE — Progress Notes (Signed)
PROGRESS NOTE  [name] PQZ:[id number] DOB: June 08, 1941 DOA: 10/23/2017 PCP: [name], MD  HPI/Recap of past 24 hours: 76 diabetic, CAD, CKD3, ischemic cardiomyopathy status post AICD, PAF on coumadin, RA who was admitted on 10/23/17 with altered mental status, intubated for airway protection and transferred to [facility] for further workup. Coumadin held due to recent UGI bleed (09/2017).  No reported acute events overnight. POD#3 post left foot endovascular revascularization. Left foot with escar tissue present. Orthopedic surgery consulted to evaluate for possible debridement. Dr. Lajoyce Corners following. Will have bone scan done today to rule left foot osteomyelitis.  Bone scan 10/29/17 left foot: 1. Abnormal three-phase bone scan with increased perfusion, blood pool and delayed phase activity in the left forefoot. This appears to localize to the first and second proximal phalangeal bases on the delayed images and is suspicious for osteomyelitis. Consider follow-up CT for confirmation. 2. Delayed phase activity at the Lisfranc joint bilaterally, likely Arthropathic.  Order f/u CT left foot which was also inconclusive. Will consult ID today 10/31/17.  Pt seen and examine at his bedside. No complaints. ID saw the patient and recommended to observe off of antibiotics. Will restart anticoagulation heparin drip for hx of p. afib.     Assessment/Plan: Active Problems:   Respiratory failure (HCC)   Sepsis (HCC)   Septic shock (HCC)   Acute cystitis without hematuria   Atrial fibrillation with rapid ventricular response (HCC)   Metabolic encephalopathy  AMS, resolved -most likely 2/2 to sepsis  Recent septic shock 2/2 to LLE cellulitis/wound, poa -off pressors 10/26/17 -transferred to medicine team 10/27/17 -completed IV vancomycin and po cipro  LLE wound, poa -post endovascular revascularization POD #5 -vascular surgery following -orthopedic surgery consulted for possible  debridement if indicated -Dr Lajoyce Corners following -wound care specialist consulted -rule out osteomyelitis -bone scan 10/29/17 revealed suspected osteomyelitis -sed rate 75, crp 6.9(10/29/17) from 16.3 (10/14/17) -CT foot to confirm osteomyelitis 10/30/17 inconclusive -ID recommends watching off of abs  Ischemic CM with EF 25-30% (10/13/17) -cardiology following -appreciate recommendations -carvedilol, plavix, isordil -allergic to statin  Paroxysmal a-fib -heparin drip -If no planned surgeries/procedures will restart coumadin -contact surgery in am  Elevated troponin from demand ischemia -troponin trended down 1.32 (10/24/17) peaked at 3.42 (10/23/17) -cardiology following -No chest pain  Acute on Chronic HFREF 25-30% -management as stated above -allergic to statin  Severe PVD post POD#4 post left foot endovascular revascularization.  -vascular surgery following -we appreciate recs -plavix -reported allergy to statin  AKI on CKD 3 -improving -cr 2.23 (10/29/17) from 2.29 from 2.42 from 2.75 -baseline cr 1.7 -avoid nephrotoxic agents/hypotension  Mild hyponatremia, resolved -resolved -Na+ 135 from 134 -possible hemodilutional -BMP am  Hypothyroidism -synthroid -tsh 8.5 -free T4 am  Recent GI bleed -Consult GI for recommendation on restarting oral anticoagulation -Hg stable -no reported acute GI bleed -The writer paged GI on call [phone]  Deconditioning -PT to evaluate and treat -PT has recommended SNF  Enlarged scrotum r/o hydrocele vs hernia -scrotum U/S 10/28/17:1. Diffuse scrotal wall thickening. 2. Small right hydrocele. Small left varicocele. No evidence of testicular mass or torsion . -patient denies scrotal pain at the time of this examination  Suspected urinary retention -bladder U/s has foley in   Code Status: Full  Family Communication: No family member at bedside  Disposition Plan: will stay another midnight to continue current  management. Gen surgery will evaluate in the am   Consultants:  Cardiology  Vascular surgery  ID  Procedures:  POD #5 post left foot endovascular revascularization.  Antimicrobials: None  DVT prophylaxis:  heparin drip for p. a-fib   Objective: Vitals:   10/30/17 1320 10/30/17 2130 10/31/17 0414 10/31/17 0629  BP: 118/64 133/80  125/80  Pulse: 88 86  81  Resp: 16 18  17   Temp: (!) 97.5 F (36.4 C) 98 F (36.7 C)  (!) 97.4 F (36.3 C)  TempSrc: Oral Oral  Oral  SpO2: 98% 97%  96%  Weight:   120.3 kg (265 lb 3.4 oz)   Height:        Intake/Output Summary (Last 24 hours) at 10/31/2017 0849 Last data filed at 10/31/2017 0648 Gross per 24 hour  Intake 807.67 ml  Output 650 ml  Net 157.67 ml   Filed Weights   10/29/17 0447 10/30/17 0018 10/31/17 0414  Weight: 117 kg (257 lb 15 oz) 117.3 kg (258 lb 9.6 oz) 120.3 kg (265 lb 3.4 oz)    Exam:   General:  77 CM WD WN NAD   Cardiovascular: RRR no rubs or gallops  Respiratory: CTA no wheezes or rhonchi  Abdomen: obese ND NT NBS x4 quadrants  Musculoskeletal: Escar tissue noted at plantar region of left foot below toes. 1/4 left dorsalis pedis pulse  Skin: as noted above  Psychiatry: mood is appropriate for condition and setting   Data Reviewed: CBC: Recent Labs  Lab 10/25/17 0452 10/28/17 0533  WBC 11.1* 8.8  HGB 7.8* 9.1*  HCT 24.4* 29.2*  MCV 99.2 99.7  PLT 255 290   Basic Metabolic Panel: Recent Labs  Lab 10/25/17 0452 10/27/17 1035 10/28/17 0533 10/29/17 0208  NA 138 134* 135 135  K 3.2* 3.8 3.9 3.8  CL 106 104 104 105  CO2 22 20* 18* 21*  GLUCOSE 106* 163* 114* 148*  BUN 67* 52* 48* 40*  CREATININE 2.75* 2.42* 2.29* 2.23*  CALCIUM 8.2* 8.5* 8.8* 8.7*  MG 2.2 2.0  --   --   PHOS 3.3 3.3  --   --    GFR: Estimated Creatinine Clearance: 35 mL/min (A) (by C-G formula based on SCr of 2.23 mg/dL (H)). Liver Function Tests: Recent Labs  Lab 10/26/17 1140 10/27/17 1035  AST 363*  231*  ALT 345* 295*  ALKPHOS 195* 198*  BILITOT 0.6 0.7  PROT 6.0* 5.6*  ALBUMIN 2.0* 2.0*   No results for input(s): LIPASE, AMYLASE in the last 168 hours. No results for input(s): AMMONIA in the last 168 hours. Coagulation Profile: No results for input(s): INR, PROTIME in the last 168 hours. Cardiac Enzymes: Recent Labs  Lab 10/24/17 1123  TROPONINI 1.32*   BNP (last 3 results) No results for input(s): PROBNP in the last 8760 hours. HbA1C: No results for input(s): HGBA1C in the last 72 hours. CBG: Recent Labs  Lab 10/30/17 0804 10/30/17 1149 10/30/17 1652 10/30/17 2132 10/31/17 0830  GLUCAP 134* 170* 169* 126* 139*   Lipid Profile: No results for input(s): CHOL, HDL, LDLCALC, TRIG, CHOLHDL, LDLDIRECT in the last 72 hours. Thyroid Function Tests: Recent Labs    10/29/17 0208  TSH 8.598*   Anemia Panel: No results for input(s): VITAMINB12, FOLATE, FERRITIN, TIBC, IRON, RETICCTPCT in the last 72 hours. Urine analysis:    Component Value Date/Time   COLORURINE AMBER (A) 10/12/2017 0450   APPEARANCEUR CLOUDY (A) 10/12/2017 0450   LABSPEC 1.024 10/12/2017 0450   PHURINE 5.0 10/12/2017 0450   GLUCOSEU NEGATIVE 10/12/2017 0450   HGBUR LARGE (A) 10/12/2017 0450   BILIRUBINUR  NEGATIVE 10/12/2017 0450   KETONESUR 5 (A) 10/12/2017 0450   PROTEINUR 100 (A) 10/12/2017 0450   UROBILINOGEN 0.2 07/05/2014 2117   NITRITE NEGATIVE 10/12/2017 0450   LEUKOCYTESUR TRACE (A) 10/12/2017 0450   Sepsis Labs: @LABRCNTIP (procalcitonin:4,lacticidven:4)  ) Recent Results (from the past 240 hour(s))  Culture, blood (routine x 2)     Status: None   Collection Time: 10/23/17  6:12 AM  Result Value Ref Range Status   Specimen Description BLOOD LEFT HAND  Final   Special Requests IN PEDIATRIC BOTTLE Blood Culture adequate volume  Final   Culture NO GROWTH 5 DAYS  Final   Report Status 10/28/2017 FINAL  Final  Culture, blood (routine x 2)     Status: None   Collection Time:  10/23/17  6:41 AM  Result Value Ref Range Status   Specimen Description BLOOD LEFT THUMB  Final   Special Requests IN PEDIATRIC BOTTLE Blood Culture adequate volume  Final   Culture NO GROWTH 5 DAYS  Final   Report Status 10/28/2017 FINAL  Final  MRSA PCR Screening     Status: None   Collection Time: 10/23/17  7:00 AM  Result Value Ref Range Status   MRSA by PCR NEGATIVE NEGATIVE Final    Comment:        The GeneXpert MRSA Assay (FDA approved for NASAL specimens only), is one component of a comprehensive MRSA colonization surveillance program. It is not intended to diagnose MRSA infection nor to guide or monitor treatment for MRSA infections.   Culture, respiratory (NON-Expectorated)     Status: None   Collection Time: 10/23/17  9:00 AM  Result Value Ref Range Status   Specimen Description TRACHEAL ASPIRATE  Final   Special Requests NONE  Final   Gram Stain   Final    RARE WBC PRESENT, PREDOMINANTLY PMN FEW GRAM POSITIVE COCCI IN PAIRS RARE GRAM NEGATIVE RODS RARE GRAM NEGATIVE COCCOBACILLI    Culture Consistent with normal respiratory flora.  Final   Report Status 10/25/2017 FINAL  Final  Urine Culture     Status: None   Collection Time: 10/23/17  5:35 PM  Result Value Ref Range Status   Specimen Description URINE, RANDOM  Final   Special Requests NONE  Final   Culture NO GROWTH  Final   Report Status 10/24/2017 FINAL  Final      Studies: Ct Foot Left Wo Contrast  Result Date: 10/30/2017 CLINICAL DATA:  Diffuse soft tissue swelling. Stepped on a tack, now with subsequent infection. EXAM: CT OF THE LEFT FOOT WITHOUT CONTRAST TECHNIQUE: Multidetector CT imaging of the left foot was performed according to the standard protocol. Multiplanar CT image reconstructions were also generated. COMPARISON:  Radiographs 10/03/2017 FINDINGS: Diffuse and fairly marked subcutaneous soft tissue swelling/ edema/ fluid consistent with cellulitis. No discrete fluid collection to suggest a  drainable abscess. Diffuse fatty change involving the foot and ankle musculature but no definite findings for myofasciitis or pyomyositis. The On the sagittal sequence there is a small open wound noted along the plantar aspect of the forefoot at the level of the first metatarsal head. No radiopaque foreign body is identified. No findings suspicious for the septic arthritis or osteomyelitis. Moderate midfoot degenerative changes. There are also cystic changes in the distal calcaneus but no worrisome bone lesions. IMPRESSION: Diffuse cellulitis without discrete drainable soft tissue abscess or radiopaque foreign body. No definite CT findings for septic arthritis or osteomyelitis. Moderate degenerative changes. Electronically Signed   By: Rudie Meyer  M.D.   On: 10/30/2017 15:49    Scheduled Meds: . carvedilol  6.25 mg Oral BID WC  . clopidogrel  75 mg Oral Daily  . collagenase   Topical Daily  . heparin  5,000 Units Subcutaneous Q8H  . insulin aspart  0-15 Units Subcutaneous TID WC  . insulin aspart  0-5 Units Subcutaneous QHS  . insulin glargine  5 Units Subcutaneous BID  . isosorbide dinitrate  10 mg Oral TID  . levothyroxine  50 mcg Oral QAC breakfast  . pantoprazole  40 mg Oral QHS  . protein supplement shake  11 oz Oral BID BM    Continuous Infusions: . sodium chloride Stopped (10/26/17 1346)     LOS: 8 days     Darlin Drop, MD [facility] Pager [phone]  If 7PM-7AM, please contact night-coverage [email or web address] Password [facility] 10/31/2017, 8:49 AM

## 2017-10-31 NOTE — Progress Notes (Signed)
ANTICOAGULATION CONSULT NOTE - Initial Consult  Pharmacy Consult for heparin Indication: atrial fibrillation  Allergies  Allergen Reactions  . Allegra [Fexofenadine] Itching  . Crestor [Rosuvastatin Calcium] Other (See Comments)    Body aches  . Codeine Other (See Comments)    blisters, but able to take hydrocodone    Patient Measurements: Height: 5\' 8"  (172.7 cm) Weight: 265 lb 3.4 oz (120.3 kg) IBW/kg (Calculated) : 68.4 Heparin Dosing Weight: 96 kg  Vital Signs: Temp: 97.5 F (36.4 C) (12/16 1431) Temp Source: Oral (12/16 1431) BP: 127/80 (12/16 1431) Pulse Rate: 80 (12/16 1431)  Labs: Recent Labs    10/29/17 0208  CREATININE 2.23*    Estimated Creatinine Clearance: 35 mL/min (A) (by C-G formula based on SCr of 2.23 mg/dL (H)).   Medical History: Past Medical History:  Diagnosis Date  . AICD (automatic cardioverter/defibrillator) present   . Anemia   . Anemia-chronic    a. Pt reports h/o anemia - was offered IV iron in past by nephrologist but declined and has taken PO instead.  . Aneurysm, cerebral   . Arthritis    bilateral wrist and fingers  . BPH (benign prostatic hypertrophy)   . Cardiomyopathy, ischemic    LVEF 25-35%.  . Chronic kidney disease, stage III (moderate) (HCC)    Followed by Dr. 0209.  . Coronary atherosclerosis of native coronary artery    a. DES to RCA 03/2009. b. s/p PTCA to RCA for ISR, 05/2010. c. 03/2013 NSTEMI 2/2 severe prox RCA s/p PTCA/DES, med rx for residual dz.  . Cystitis 10/2017  . Essential hypertension, benign   . Hyperlipidemia   . Hypothyroidism   . LBBB (left bundle branch block)    s/p BiV ICD Implanted by Dr 11/2017  . Morbid obesity (HCC)   . MRSA (methicillin resistant Staphylococcus aureus)   . Myocardial infarction (HCC)   . Paroxysmal atrial fibrillation (HCC)    a. Coumadin discontinue in 2010 when the patient required ASA/Plavix for stenting. b. Coumadin restarted 05/2013, Plavix continued, ASA stopped.    . Pneumonia    years ago 66  . Pulmonary nodule    CXR, 03/2013, [facility] - not described on any subsequent chest x-rays however, could be a vascular structure  . TIA (transient ischemic attack)    Multiple TIAs  . Type 2 diabetes mellitus (HCC)     Medications:  Medications Prior to Admission  Medication Sig Dispense Refill Last Dose  . acetaminophen (TYLENOL 8 HOUR ARTHRITIS PAIN) 650 MG CR tablet Take 1 tablet by mouth daily as needed for pain.   unk at 04/2013  . carvedilol (COREG) 12.5 MG tablet TAKE 1 AND 1/2 TABLETS BY MOUTH TWICE DAILY WITH MEALS (DUE FOR FOLLOW UP) (Patient taking differently: 12.5mg  by mouth twice daily) 270 tablet 1 Past Month at Unknown time  . Cholecalciferol (VITAMIN D3) 10000 units TABS Take 1,000 Units by mouth daily.   Past Month at Unknown time  . clopidogrel (PLAVIX) 75 MG tablet Take 1 tablet (75 mg total) by mouth daily. 30 tablet 6 09/27/2017 at Unknown time  . hydrALAZINE (APRESOLINE) 25 MG tablet Take 25 mg by mouth 3 (three) times daily.   Past Month at Unknown time  . HYDROcodone-acetaminophen (NORCO) 7.5-325 MG tablet Take 1 tablet by mouth every 6 (six) hours as needed for moderate pain. 10 tablet 0 unk at unk  . insulin lispro protamine-lispro (HUMALOG 75/25 MIX) (75-25) 100 UNIT/ML SUSP injection Inject 10 Units into the skin 2 (two)  times daily with a meal. Take 10units BID with meals (Patient taking differently: Inject 10 Units into the skin 2 (two) times daily with a meal. )   10/21/2017 at Unknown time  . isosorbide mononitrate (IMDUR) 30 MG 24 hr tablet Take 30 mg by mouth daily.   Past Month at Unknown time  . l-methylfolate-B6-B12 (METANX) 3-35-2 MG TABS tablet Take 1 tablet by mouth daily.   Past Month at Unknown time  . levothyroxine (SYNTHROID, LEVOTHROID) 50 MCG tablet Take 50 mcg by mouth daily.   Past Month at Unknown time  . lisinopril (PRINIVIL,ZESTRIL) 2.5 MG tablet Take 2.5 mg by mouth daily.   Past Month at Unknown time  . nitroGLYCERIN  (NITROSTAT) 0.4 MG SL tablet Place 0.4 mg under the tongue every 5 (five) minutes as needed for chest pain. For chest pain   unk at unk  . tamsulosin (FLOMAX) 0.4 MG CAPS capsule Take 0.4 mg by mouth daily as needed (bladder).    unk at unk  . torsemide (DEMADEX) 10 MG tablet Take 1 tablet (10 mg total) by mouth 2 (two) times daily. (Patient taking differently: Take 10 mg by mouth daily. May take additional 10mg  by mouth in evening as needed for fluid) 60 tablet 3 Past Month at Unknown time  . warfarin (COUMADIN) 2.5 MG tablet Take 2.5-5 mg by mouth See admin instructions. Pt takes 2.5mg  by mouth once daily on Mon, Wed, Fri - takes 5mg  once daily Tues, Thurs, Sat, Sun   09/27/2017  . Zinc 50 MG CAPS Take 50 mg by mouth every evening.    Past Month at Unknown time  . [DISCONTINUED] cephALEXin (KEFLEX) 250 MG capsule Take 1 capsule (250 mg total) by mouth every 8 (eight) hours. For 5days (Patient not taking: Reported on 10/24/2017) 15 capsule 0 Not Taking at Unknown time  . [DISCONTINUED] pantoprazole (PROTONIX) 40 MG tablet Take 1 tablet (40 mg total) by mouth 2 (two) times daily. (Patient not taking: Reported on 10/24/2017)   Not Taking at Unknown time  . [DISCONTINUED] sulfaSALAzine (AZULFIDINE EN-TABS) 500 MG EC tablet Take 1 tablet (500 mg total) by mouth 2 (two) times daily. (Patient not taking: Reported on 10/24/2017) 60 tablet 2 Not Taking at Unknown time    Assessment: 32 YOM with history of Afib. Patient was on warfarin at home but this was held due to recent GI bleed (09/2017). Starting IV heparin for now. Will avoid heparin boluses and aim for lower end of goal range. Currently on subQ heparin.   Goal of Therapy:  Heparin level 0.3-0.5 units/ml Monitor platelets by anticoagulation protocol: Yes   Plan:  -D/c subq heparin -Start IV heparin at 1300 units/hr. No boluses due to recent GI bleed  -F/u 8 hr HL -Monitor daily HL, CBC and s/s of bleeding  Shandrea Lusk, 61 10/31/2017,7:31  PM

## 2017-11-01 DIAGNOSIS — N183 Chronic kidney disease, stage 3 (moderate): Secondary | ICD-10-CM

## 2017-11-01 DIAGNOSIS — Z4659 Encounter for fitting and adjustment of other gastrointestinal appliance and device: Secondary | ICD-10-CM

## 2017-11-01 DIAGNOSIS — I480 Paroxysmal atrial fibrillation: Secondary | ICD-10-CM

## 2017-11-01 DIAGNOSIS — I255 Ischemic cardiomyopathy: Secondary | ICD-10-CM

## 2017-11-01 LAB — HEPARIN LEVEL (UNFRACTIONATED)
HEPARIN UNFRACTIONATED: 0.33 [IU]/mL (ref 0.30–0.70)
Heparin Unfractionated: 0.28 IU/mL — ABNORMAL LOW (ref 0.30–0.70)
Heparin Unfractionated: <0.1 IU/mL — ABNORMAL LOW (ref 0.30–0.70)

## 2017-11-01 LAB — BASIC METABOLIC PANEL
Anion gap: 12 (ref 5–15)
BUN: 33 mg/dL — AB (ref 6–20)
CHLORIDE: 105 mmol/L (ref 101–111)
CO2: 18 mmol/L — ABNORMAL LOW (ref 22–32)
CREATININE: 2.07 mg/dL — AB (ref 0.61–1.24)
Calcium: 8.7 mg/dL — ABNORMAL LOW (ref 8.9–10.3)
GFR calc Af Amer: 34 mL/min — ABNORMAL LOW (ref >60)
GFR, EST NON AFRICAN AMERICAN: 29 mL/min — AB (ref >60)
GLUCOSE: 100 mg/dL — AB (ref 65–99)
Potassium: 4 mmol/L (ref 3.5–5.1)
SODIUM: 135 mmol/L (ref 135–145)

## 2017-11-01 LAB — GLUCOSE, CAPILLARY
GLUCOSE-CAPILLARY: 129 mg/dL — AB (ref 65–99)
GLUCOSE-CAPILLARY: 83 mg/dL (ref 65–99)
Glucose-Capillary: 108 mg/dL — ABNORMAL HIGH (ref 65–99)
Glucose-Capillary: 122 mg/dL — ABNORMAL HIGH (ref 65–99)

## 2017-11-01 LAB — CBC
HCT: 31 % — ABNORMAL LOW (ref 39.0–52.0)
Hemoglobin: 9.6 g/dL — ABNORMAL LOW (ref 13.0–17.0)
MCH: 31.6 pg (ref 26.0–34.0)
MCHC: 31 g/dL (ref 30.0–36.0)
MCV: 102 fL — ABNORMAL HIGH (ref 78.0–100.0)
PLATELETS: 276 10*3/uL (ref 150–400)
RBC: 3.04 MIL/uL — ABNORMAL LOW (ref 4.22–5.81)
RDW: 20.2 % — ABNORMAL HIGH (ref 11.5–15.5)
WBC: 7.6 10*3/uL (ref 4.0–10.5)

## 2017-11-01 MED ORDER — WARFARIN - PHARMACIST DOSING INPATIENT
Freq: Every day | Status: DC
  Administered 2017-11-01 – 2017-11-02 (×2)

## 2017-11-01 MED ORDER — FUROSEMIDE 10 MG/ML IJ SOLN
40.0000 mg | Freq: Two times a day (BID) | INTRAMUSCULAR | Status: DC
  Administered 2017-11-01 – 2017-11-05 (×8): 40 mg via INTRAVENOUS
  Filled 2017-11-01 (×9): qty 4

## 2017-11-01 MED ORDER — HYDRALAZINE HCL 10 MG PO TABS
10.0000 mg | ORAL_TABLET | Freq: Three times a day (TID) | ORAL | Status: DC
  Administered 2017-11-01 – 2017-11-06 (×15): 10 mg via ORAL
  Filled 2017-11-01 (×15): qty 1

## 2017-11-01 MED ORDER — CARVEDILOL 12.5 MG PO TABS
12.5000 mg | ORAL_TABLET | Freq: Two times a day (BID) | ORAL | Status: DC
  Administered 2017-11-01 – 2017-11-04 (×6): 12.5 mg via ORAL
  Filled 2017-11-01 (×6): qty 1

## 2017-11-01 MED ORDER — WARFARIN SODIUM 4 MG PO TABS
4.0000 mg | ORAL_TABLET | Freq: Once | ORAL | Status: AC
  Administered 2017-11-01: 4 mg via ORAL
  Filled 2017-11-01: qty 1

## 2017-11-01 MED ORDER — COLLAGENASE 250 UNIT/GM EX OINT: 1.0000 "application " | TOPICAL_OINTMENT | Freq: Every day | CUTANEOUS | 3 refills | Status: AC

## 2017-11-01 NOTE — Care Management Important Message (Signed)
Important Message  Patient Details  Name: [name] MRN: [id number] Date of Birth: 1941/05/20   Medicare Important Message Given:  Yes    Reyann Troop Abena 11/01/2017, 11:03 AM

## 2017-11-01 NOTE — Progress Notes (Signed)
PROGRESS NOTE  [name] HDQ:[id number] DOB: 04-14-1941 DOA: 10/23/2017 PCP: [name], MD  HPI/Recap of past 24 hours: 76 diabetic, CAD, CKD3, ischemic cardiomyopathy status post AICD, PAF on coumadin, RA who was admitted on 10/23/17 with altered mental status, intubated for airway protection and transferred to [facility] for further workup. Coumadin held due to recent UGI bleed (09/2017).  No reported acute events overnight. POD#3 post left foot endovascular revascularization. Left foot with escar tissue present. Orthopedic surgery consulted to evaluate for possible debridement. Dr. Lajoyce Corners following. Will have bone scan done today to rule left foot osteomyelitis.  Bone scan 10/29/17 left foot: 1. Abnormal three-phase bone scan with increased perfusion, blood pool and delayed phase activity in the left forefoot. This appears to localize to the first and second proximal phalangeal bases on the delayed images and is suspicious for osteomyelitis. Consider follow-up CT for confirmation. 2. Delayed phase activity at the Lisfranc joint bilaterally, likely Arthropathic.  Order f/u CT left foot which was also inconclusive.   No acute events reported overnight. Ortho recommends conservative treatment. Restart coumadin. Pharmacy to dose. Patient seen and examined with no family member at bedside. He has no complaints.  Assessment/Plan: Active Problems:   Diabetes mellitus (HCC)   Dyslipidemia   CAD, NATIVE VESSEL   SYSTOLIC HEART FAILURE, CHRONIC   Biventricular ICD (implantable cardioverter-defibrillator) in place   Cardiomyopathy, ischemic   Paroxysmal atrial fibrillation (HCC)   CKD (chronic kidney disease) stage 3, GFR 30-59 ml/min (HCC)   PVD (peripheral vascular disease) with claudication (HCC)   Rheumatoid arthritis involving multiple sites with positive rheumatoid factor (HCC)   Plantar ulcer of left foot (HCC)  AMS, resolved -most likely 2/2 to sepsis  Recent septic shock  2/2 to LLE cellulitis/wound, poa -off pressors 10/26/17 -transferred to medicine team 10/27/17 -completed IV vancomycin and po cipro  LLE wound, poa -post endovascular revascularization POD #6 -vascular surgery following -orthopedic surgery consulted for possible debridement if indicated -Dr Lajoyce Corners signed off. Recommends conservative approach. -wound care specialist following -bone scan 10/29/17 revealed suspected osteomyelitis -sed rate 75, crp 6.9(10/29/17) from 16.3 (10/14/17) -CT foot to confirm osteomyelitis 10/30/17 inconclusive -ID recommends watching off of abs -continue wound care. No surgery planned per ortho.  Ischemic CM with EF 25-30% (10/13/17) -cardiology following -appreciate recommendations -carvedilol, plavix, isordil -allergic to statin  Paroxysmal a-fib -coumadin restarted bridge with heparin drip -pharmacy consulted for coumadin management -INR am  Elevated troponin from demand ischemia -troponin trended down 1.32 (10/24/17) peaked at 3.42 (10/23/17) -cardiology following -No chest pain  Acute on Chronic HFREF 25-30% -management as stated above -allergic to statin  Severe PVD post POD#4 post left foot endovascular revascularization.  -vascular surgery following -we appreciate recommendations -plavix -reported allergy to statin  AKI on CKD 3 -improving -cr 2.07 from 2.23 (10/29/17) from 2.29 from 2.42 from 2.75 -baseline cr 1.7 -avoid nephrotoxic agents/hypotension  Mild hyponatremia, resolved -resolved -Na+ 135 from 134 -possible hemodilutional -BMP am  Hypothyroidism -synthroid -tsh 8.5 -free T4  Recent GI bleed -Hg stable -no reported acute GI bleed -The writer paged GI on call [phone] several times  Physical Deconditioning -PT to evaluate and treat -PT has recommended SNF -patient refuses SNF  Enlarged scrotum r/o hydrocele vs hernia -scrotum U/S 10/28/17:1. Diffuse scrotal wall thickening. 2. Small right hydrocele.  Small left varicocele. No evidence of testicular mass or torsion . -patient denies scrotal pain at the time of this examination  Suspected urinary retention -bladder U/S -has foley in -  will need to f/u with urology outpatient -DC foley tomorrow 11/02/17   Code Status: Full  Family Communication: No family member at bedside  Disposition Plan: will stay another midnight to continue current management. Monitor INR for coumadin bridging.  Consultants:  Cardiology  Vascular surgery  ID  Procedures:  POD #6 post left foot endovascular revascularization.  Antimicrobials: None  DVT prophylaxis: Coumadin bridged with heparin drip for p. a-fib   Objective: Vitals:   10/31/17 1431 10/31/17 2126 11/01/17 0500 11/01/17 0556  BP: 127/80 130/83  (!) 145/86  Pulse: 80 (!) 101  94  Resp: 16 18  18   Temp: (!) 97.5 F (36.4 C) 97.9 F (36.6 C)  98.1 F (36.7 C)  TempSrc: Oral Oral  Oral  SpO2: 95% 97%  95%  Weight:   118.2 kg (260 lb 9.3 oz)   Height:        Intake/Output Summary (Last 24 hours) at 11/01/2017 0821 Last data filed at 11/01/2017 0653 Gross per 24 hour  Intake 705.67 ml  Output 550 ml  Net 155.67 ml   Filed Weights   10/30/17 0018 10/31/17 0414 11/01/17 0500  Weight: 117.3 kg (258 lb 9.6 oz) 120.3 kg (265 lb 3.4 oz) 118.2 kg (260 lb 9.3 oz)    Exam:   General:  76 CM WD WN NAD   Cardiovascular: RRR no rubs or gallops  Respiratory: CTA no wheezes or rhonchi  Abdomen: obese ND NT NBS x4 quadrants  Musculoskeletal: Escar tissue noted at plantar region of left foot below toes. 1/4 left dorsalis pedis pulse  Skin: as noted above  Psychiatry: mood is appropriate for condition and setting   Data Reviewed: CBC: Recent Labs  Lab 10/28/17 0533 11/01/17 0309  WBC 8.8 7.6  HGB 9.1* 9.6*  HCT 29.2* 31.0*  MCV 99.7 102.0*  PLT 290 276   Basic Metabolic Panel: Recent Labs  Lab 10/27/17 1035 10/28/17 0533 10/29/17 0208 11/01/17 0309  NA  134* 135 135 135  K 3.8 3.9 3.8 4.0  CL 104 104 105 105  CO2 20* 18* 21* 18*  GLUCOSE 163* 114* 148* 100*  BUN 52* 48* 40* 33*  CREATININE 2.42* 2.29* 2.23* 2.07*  CALCIUM 8.5* 8.8* 8.7* 8.7*  MG 2.0  --   --   --   PHOS 3.3  --   --   --    GFR: Estimated Creatinine Clearance: 37.3 mL/min (A) (by C-G formula based on SCr of 2.07 mg/dL (H)). Liver Function Tests: Recent Labs  Lab 10/26/17 1140 10/27/17 1035  AST 363* 231*  ALT 345* 295*  ALKPHOS 195* 198*  BILITOT 0.6 0.7  PROT 6.0* 5.6*  ALBUMIN 2.0* 2.0*   No results for input(s): LIPASE, AMYLASE in the last 168 hours. No results for input(s): AMMONIA in the last 168 hours. Coagulation Profile: No results for input(s): INR, PROTIME in the last 168 hours. Cardiac Enzymes: No results for input(s): CKTOTAL, CKMB, CKMBINDEX, TROPONINI in the last 168 hours. BNP (last 3 results) No results for input(s): PROBNP in the last 8760 hours. HbA1C: No results for input(s): HGBA1C in the last 72 hours. CBG: Recent Labs  Lab 10/31/17 0830 10/31/17 1205 10/31/17 1706 10/31/17 2127 11/01/17 0805  GLUCAP 139* 135* 119* 111* 108*   Lipid Profile: No results for input(s): CHOL, HDL, LDLCALC, TRIG, CHOLHDL, LDLDIRECT in the last 72 hours. Thyroid Function Tests: No results for input(s): TSH, T4TOTAL, FREET4, T3FREE, THYROIDAB in the last 72 hours. Anemia Panel: No results  for input(s): VITAMINB12, FOLATE, FERRITIN, TIBC, IRON, RETICCTPCT in the last 72 hours. Urine analysis:    Component Value Date/Time   COLORURINE AMBER (A) 10/12/2017 0450   APPEARANCEUR CLOUDY (A) 10/12/2017 0450   LABSPEC 1.024 10/12/2017 0450   PHURINE 5.0 10/12/2017 0450   GLUCOSEU NEGATIVE 10/12/2017 0450   HGBUR LARGE (A) 10/12/2017 0450   BILIRUBINUR NEGATIVE 10/12/2017 0450   KETONESUR 5 (A) 10/12/2017 0450   PROTEINUR 100 (A) 10/12/2017 0450   UROBILINOGEN 0.2 07/05/2014 2117   NITRITE NEGATIVE 10/12/2017 0450   LEUKOCYTESUR TRACE (A)  10/12/2017 0450   Sepsis Labs: @LABRCNTIP (procalcitonin:4,lacticidven:4)  ) Recent Results (from the past 240 hour(s))  Culture, blood (routine x 2)     Status: None   Collection Time: 10/23/17  6:12 AM  Result Value Ref Range Status   Specimen Description BLOOD LEFT HAND  Final   Special Requests IN PEDIATRIC BOTTLE Blood Culture adequate volume  Final   Culture NO GROWTH 5 DAYS  Final   Report Status 10/28/2017 FINAL  Final  Culture, blood (routine x 2)     Status: None   Collection Time: 10/23/17  6:41 AM  Result Value Ref Range Status   Specimen Description BLOOD LEFT THUMB  Final   Special Requests IN PEDIATRIC BOTTLE Blood Culture adequate volume  Final   Culture NO GROWTH 5 DAYS  Final   Report Status 10/28/2017 FINAL  Final  MRSA PCR Screening     Status: None   Collection Time: 10/23/17  7:00 AM  Result Value Ref Range Status   MRSA by PCR NEGATIVE NEGATIVE Final    Comment:        The GeneXpert MRSA Assay (FDA approved for NASAL specimens only), is one component of a comprehensive MRSA colonization surveillance program. It is not intended to diagnose MRSA infection nor to guide or monitor treatment for MRSA infections.   Culture, respiratory (NON-Expectorated)     Status: None   Collection Time: 10/23/17  9:00 AM  Result Value Ref Range Status   Specimen Description TRACHEAL ASPIRATE  Final   Special Requests NONE  Final   Gram Stain   Final    RARE WBC PRESENT, PREDOMINANTLY PMN FEW GRAM POSITIVE COCCI IN PAIRS RARE GRAM NEGATIVE RODS RARE GRAM NEGATIVE COCCOBACILLI    Culture Consistent with normal respiratory flora.  Final   Report Status 10/25/2017 FINAL  Final  Urine Culture     Status: None   Collection Time: 10/23/17  5:35 PM  Result Value Ref Range Status   Specimen Description URINE, RANDOM  Final   Special Requests NONE  Final   Culture NO GROWTH  Final   Report Status 10/24/2017 FINAL  Final      Studies: No results found.  Scheduled  Meds: . carvedilol  6.25 mg Oral BID WC  . clopidogrel  75 mg Oral Daily  . collagenase   Topical Daily  . insulin aspart  0-15 Units Subcutaneous TID WC  . insulin aspart  0-5 Units Subcutaneous QHS  . insulin glargine  5 Units Subcutaneous BID  . isosorbide dinitrate  10 mg Oral TID  . levothyroxine  50 mcg Oral QAC breakfast  . pantoprazole  40 mg Oral QHS  . protein supplement shake  11 oz Oral BID BM    Continuous Infusions: . sodium chloride Stopped (10/26/17 1346)  . heparin 1,300 Units/hr (10/31/17 2020)     LOS: 9 days     11/02/17, MD [facility] Pager [phone]  If 7PM-7AM, please contact night-coverage [email or web address] Password [email or web address] 11/01/2017, 8:21 AM

## 2017-11-01 NOTE — Consult Note (Addendum)
   [facility] CM Inpatient Consult   11/01/2017  [name] 11-12-1941 [id number]    [facility] Care Management follow up.  Spoke with [name] about [employer] Care Management program as a benefit of his [employer] insurance. [name] is agreeable and [facility] Care Management written consent obtained.   [name] reports he lives with his wife.  Primary Care MD is Dr. Sherryll Burger. Denies having issues or concerns with medications. Denies having transportation issues. Confirms best contact number as (cell) [phone] and (home) [phone].   [name] endorses he is still rather weak. Discussed recommendations for SNF placement.  Made inpatient RNCM aware that [facility] Care Management will follow post discharge. Will make appropriate [facility] Care Management referrals once disposition plans are known.   Raiford Noble, MSN-Ed, RN,BSN [facility] Liaison [phone]

## 2017-11-01 NOTE — Progress Notes (Signed)
Progress Note  Patient Name: [name] Date of Encounter: 11/01/2017  Primary Cardiologist: Dr. Diona Browner, Dr. Johney Frame (EP)  Subjective   Pt denies chest pain and shortness of breath. Feels better, still refusing SNF  Inpatient Medications    Scheduled Meds: . carvedilol  6.25 mg Oral BID WC  . clopidogrel  75 mg Oral Daily  . collagenase   Topical Daily  . insulin aspart  0-15 Units Subcutaneous TID WC  . insulin aspart  0-5 Units Subcutaneous QHS  . insulin glargine  5 Units Subcutaneous BID  . isosorbide dinitrate  10 mg Oral TID  . levothyroxine  50 mcg Oral QAC breakfast  . pantoprazole  40 mg Oral QHS  . protein supplement shake  11 oz Oral BID BM   Continuous Infusions: . sodium chloride Stopped (10/26/17 1346)  . heparin 1,300 Units/hr (10/31/17 2020)   PRN Meds: sodium chloride, acetaminophen (TYLENOL) oral liquid 160 mg/5 mL   Vital Signs    Vitals:   10/31/17 1431 10/31/17 2126 11/01/17 0500 11/01/17 0556  BP: 127/80 130/83  (!) 145/86  Pulse: 80 (!) 101  94  Resp: 16 18  18   Temp: (!) 97.5 F (36.4 C) 97.9 F (36.6 C)  98.1 F (36.7 C)  TempSrc: Oral Oral  Oral  SpO2: 95% 97%  95%  Weight:   260 lb 9.3 oz (118.2 kg)   Height:        Intake/Output Summary (Last 24 hours) at 11/01/2017 0859 Last data filed at 11/01/2017 11/03/2017 Gross per 24 hour  Intake 705.67 ml  Output 550 ml  Net 155.67 ml   Filed Weights   10/30/17 0018 10/31/17 0414 11/01/17 0500  Weight: 258 lb 9.6 oz (117.3 kg) 265 lb 3.4 oz (120.3 kg) 260 lb 9.3 oz (118.2 kg)     Physical Exam   General: Well developed, well nourished, male appearing in no acute distress. Head: Normocephalic, atraumatic.  Neck: Supple without bruits, no JVD but exam difficult. Lungs:  Resp regular and unlabored, CTA, diminished in bases Heart: Irregular rhythm, regular rate, no murmur; no rub. Abdomen: Soft, non-tender, non-distended with normoactive bowel sounds. No hepatomegaly. No  rebound/guarding. No obvious abdominal masses. Extremities: No clubbing, cyanosis, 1+ edema. Left foot wrapped Neuro: Alert and oriented X 3. Moves all extremities spontaneously. Psych: Normal affect.  Labs    Chemistry Recent Labs  Lab 10/26/17 1140  10/27/17 1035 10/28/17 0533 10/29/17 0208 11/01/17 0309  NA  --    < > 134* 135 135 135  K  --    < > 3.8 3.9 3.8 4.0  CL  --    < > 104 104 105 105  CO2  --    < > 20* 18* 21* 18*  GLUCOSE  --    < > 163* 114* 148* 100*  BUN  --    < > 52* 48* 40* 33*  CREATININE  --    < > 2.42* 2.29* 2.23* 2.07*  CALCIUM  --    < > 8.5* 8.8* 8.7* 8.7*  PROT 6.0*  --  5.6*  --   --   --   ALBUMIN 2.0*  --  2.0*  --   --   --   AST 363*  --  231*  --   --   --   ALT 345*  --  295*  --   --   --   ALKPHOS 195*  --  198*  --   --   --  BILITOT 0.6  --  0.7  --   --   --   GFRNONAA  --    < > 24* 26* 27* 29*  GFRAA  --    < > 28* 30* 31* 34*  ANIONGAP  --    < > 10 13 9 12    < > = values in this interval not displayed.     Hematology Recent Labs  Lab 10/28/17 0533 11/01/17 0309  WBC 8.8 7.6  RBC 2.93* 3.04*  HGB 9.1* 9.6*  HCT 29.2* 31.0*  MCV 99.7 102.0*  MCH 31.1 31.6  MCHC 31.2 31.0  RDW 19.3* 20.2*  PLT 290 276    Cardiac EnzymesNo results for input(s): TROPONINI in the last 168 hours. No results for input(s): TROPIPOC in the last 168 hours.   BNPNo results for input(s): BNP, PROBNP in the last 168 hours.   DDimer No results for input(s): DDIMER in the last 168 hours.   Radiology    Ct Foot Left Wo Contrast  Result Date: 10/30/2017 CLINICAL DATA:  Diffuse soft tissue swelling. Stepped on a tack, now with subsequent infection. EXAM: CT OF THE LEFT FOOT WITHOUT CONTRAST TECHNIQUE: Multidetector CT imaging of the left foot was performed according to the standard protocol. Multiplanar CT image reconstructions were also generated. COMPARISON:  Radiographs 10/03/2017 FINDINGS: Diffuse and fairly marked subcutaneous soft tissue  swelling/ edema/ fluid consistent with cellulitis. No discrete fluid collection to suggest a drainable abscess. Diffuse fatty change involving the foot and ankle musculature but no definite findings for myofasciitis or pyomyositis. The On the sagittal sequence there is a small open wound noted along the plantar aspect of the forefoot at the level of the first metatarsal head. No radiopaque foreign body is identified. No findings suspicious for the septic arthritis or osteomyelitis. Moderate midfoot degenerative changes. There are also cystic changes in the distal calcaneus but no worrisome bone lesions. IMPRESSION: Diffuse cellulitis without discrete drainable soft tissue abscess or radiopaque foreign body. No definite CT findings for septic arthritis or osteomyelitis. Moderate degenerative changes. Electronically Signed   By: Rudie Meyer M.D.   On: 10/30/2017 15:49     Telemetry    Afib, ventricular pacing, short runs NSTV - Personally Reviewed  ECG    No new tracings - Personally Reviewed   Cardiac Studies   Echo 10/13/17: Study Conclusions - Left ventricle: Diffuse hypokinesis worse in inferior wall and   abnormal septal motion The cavity size was moderately dilated.   Wall thickness was increased in a pattern of severe LVH. Systolic   function was severely reduced. The estimated ejection fraction   was in the range of 25% to 30%. Doppler parameters are consistent   with abnormal left ventricular relaxation (grade 1 diastolic   dysfunction). - Mitral valve: Calcified annulus. Mildly thickened leaflets .   There was mild regurgitation. - Atrial septum: No defect or patent foramen ovale was identified.   Echo 03/03/17: Study Conclusions - Procedure narrative: Transthoracic echocardiography. Image   quality was suboptimal. The study was technically difficult, as a   result of poor sound wave transmission and body habitus. - Left ventricle: The cavity size was normal. Wall thickness  was   increased in a pattern of moderate LVH. Systolic function was   normal. The estimated ejection fraction was in the range of 55%   to 60%. Images were inadequate for LV wall motion assessment. No   gross regional variation on apical 4-chamber views. Doppler  parameters are consistent with abnormal left ventricular   relaxation (grade 1 diastolic dysfunction). Doppler parameters   are consistent with high ventricular filling pressure. - Aortic valve: Mildly to moderately calcified annulus. - Mitral valve: Mildly calcified annulus. There was mild   regurgitation. - Left atrium: The atrium was moderately dilated. - Right ventricle: Pacer wire or catheter noted in right ventricle.  Patient Profile     76 y.o. male admitted with altered mental status and respiratory failure. He hasBiVAICD and PAF, elevated troponin PAF.EP interrogated device and reported he had been in Afib since approximately 09/19/17.   Assessment & Plan    1. Atrial fibrillation, s/p ICD - AV pacing at baseline, under sending atria, device reprogrammed per EP on 10/23/17 - coumadin previously held for recent UGI bleed (09/2017) - on heparin drip - restart coumadin when safe to do so from ortho/vascular surgery when no other procedures planned - on coreg BID - digoxin was discontinued   2. elevated troponin - elevated troponin thought to be demand ischemia in setting of Afib:  0.42 --> 2.81 --> 3.42 --> 2.33 --> 1.32 - on plavix, no ASA with coumadin - continue isordil TID - no complaints of chest pain - would recommend ischemic evaluation after he recovers from this hospitalization   3. New onset systolic heart failure - not currently on a diuretic - sCr still elevated to 2.07, baseline may be 1.5-2.0, difficult to determine - would favor starting gentle lasix as he has lower extremity edema   4. Respiratory failure - has been extubated - CXR with mild interstitial edema - continue ABX per primary  team   5. PVD, right foot osteomyelitis  - MRI/CT of foot shows area of osteomyelitis, but clinically does not appear to have abscess - per ortho, will mange conservatively     Signed, Marcelino Duster , PA-C 8:59 AM 11/01/2017 Pager: [phone]  As above, patient seen and examined.  He has some dyspnea on exertion but denies chest pain.  He is significantly volume overloaded on examination.  His rate is elevated at times.  1 acute on chronic systolic congestive heart failure-patient is volume overloaded on examination.  His diuretics were held.  I will resume Lasix 40 mg IV twice daily.  Follow renal function closely with diuresis.  He needs fluid restriction to 1.5 L daily and low-sodium diet as well.  2 ischemic cardiomyopathy-previous echocardiogram April 2018 showed his LV function had normalized.  It is now decreased again.  Question if this may be related to atrial fibrillation with rapid ventricular response.  Could also be ischemia mediated given non-ST elevation myocardial infarction.  I will increase carvedilol to 12.5 mg twice daily.  Add hydralazine 10 mg p.o. 3 times daily and continue isosorbide.  We will avoid ACE inhibitor or ARB given renal insufficiency.  3 non-ST elevation myocardial infarction-continue Plavix and beta-blocker.  He apparently is intolerant to statins.  He will need further ischemia evaluation after he recovers from present hospitalization.  4 paroxysmal atrial fibrillation-patient remains in atrial fibrillation today.  Rate is increased at times.  Increase carvedilol to 12.5 mg twice daily and follow.  Continue heparin until INR is therapeutic.  Continue Coumadin.  If we have a difficult time managing congestive heart failure despite rate control he may benefit from TEE guided cardioversion.  Otherwise could consider cardioverting 4-6 weeks after INR therapeutic.  5 status post ICD  6 peripheral vascular disease and possible cellulitis-management  per primary care.  7 chronic stage III kidney disease-follow renal function closely with diuresis.  Olga Millers, MD

## 2017-11-01 NOTE — Progress Notes (Signed)
ANTICOAGULATION CONSULT NOTE - Follow Up Consult  Pharmacy Consult for heparin Indication: atrial fibrillation  Labs: Recent Labs    11/01/17 0309  HGB 9.6*  HCT 31.0*  PLT 276  HEPARINUNFRC 0.33  CREATININE 2.07*    Assessment/Plan:  76yo male therapeutic on heparin with initial dosing for Afib. Will continue gtt at current rate and confirm stable with additional level.   Vernard Gambles, PharmD, BCPS  11/01/2017,5:44 AM

## 2017-11-01 NOTE — Progress Notes (Signed)
ANTICOAGULATION CONSULT NOTE - Follow Up Consult  Pharmacy Consult for Heparin and Warfarin Indication: atrial fibrillation  Allergies  Allergen Reactions  . Allegra [Fexofenadine] Itching  . Crestor [Rosuvastatin Calcium] Other (See Comments)    Body aches  . Codeine Other (See Comments)    blisters, but able to take hydrocodone    Patient Measurements: Height: 5\' 8"  (172.7 cm) Weight: 260 lb 9.3 oz (118.2 kg) IBW/kg (Calculated) : 68.4 Heparin Dosing Weight: 95 kg  Vital Signs: Temp: 98.1 F (36.7 C) (12/17 0556) Temp Source: Oral (12/17 0556) BP: 145/86 (12/17 0556) Pulse Rate: 94 (12/17 0556)  Labs: Recent Labs    11/01/17 0309 11/01/17 1253  HGB 9.6*  --   HCT 31.0*  --   PLT 276  --   HEPARINUNFRC 0.33 0.28*  CREATININE 2.07*  --     Estimated Creatinine Clearance: 37.3 mL/min (A) (by C-G formula based on SCr of 2.07 mg/dL (H)).   Medications:  Scheduled:  . carvedilol  12.5 mg Oral BID WC  . clopidogrel  75 mg Oral Daily  . collagenase   Topical Daily  . furosemide  40 mg Intravenous BID  . hydrALAZINE  10 mg Oral Q8H  . insulin aspart  0-15 Units Subcutaneous TID WC  . insulin aspart  0-5 Units Subcutaneous QHS  . insulin glargine  5 Units Subcutaneous BID  . isosorbide dinitrate  10 mg Oral TID  . levothyroxine  50 mcg Oral QAC breakfast  . pantoprazole  40 mg Oral QHS  . protein supplement shake  11 oz Oral BID BM   Infusions:  . sodium chloride Stopped (10/26/17 1346)  . heparin 1,300 Units/hr (10/31/17 2020)    Assessment: 76 yo M on Warfarin PTA for hx afib.  Warfarin was initially held in the setting of GIB (last dose 11/12).  GIB now resolved.  Pt was started on heparin bridging back to warfarin.  Heparin level = 0.28 on 1300 units/hr  Goal of Therapy:  INR 2-3 Heparin level 0.3-0.5 units/ml (lower goal due to recent GIB) Monitor platelets by anticoagulation protocol: Yes   Plan:  Increase heparin infusion to 1450 units/hr Next  heparin level in 6 hours. Warfarin 4mg  PO x 1 tonight. Daily heparin level, CBC, and INR  13/12, Pharm.D., BCPS Clinical Pharmacist Pager: [phone] Clinical phone for 11/01/2017 from 8:30-4:00 is [phone]. After 4pm, please call Main Rx [phone]) for assistance. 11/01/2017 2:28 PM

## 2017-11-01 NOTE — Progress Notes (Signed)
ANTICOAGULATION CONSULT NOTE - Follow Up Consult  Pharmacy Consult for Heparin and Warfarin Indication: atrial fibrillation  Allergies  Allergen Reactions  . Allegra [Fexofenadine] Itching  . Crestor [Rosuvastatin Calcium] Other (See Comments)    Body aches  . Codeine Other (See Comments)    blisters, but able to take hydrocodone    Patient Measurements: Height: 5\' 8"  (172.7 cm) Weight: 260 lb 9.3 oz (118.2 kg) IBW/kg (Calculated) : 68.4 Heparin Dosing Weight: 95 kg  Vital Signs: Temp: 97.3 F (36.3 C) (12/17 2122) Temp Source: Oral (12/17 2122) BP: 127/82 (12/17 2122) Pulse Rate: 93 (12/17 2122)  Labs: Recent Labs    11/01/17 0309 11/01/17 1253 11/01/17 2112  HGB 9.6*  --   --   HCT 31.0*  --   --   PLT 276  --   --   HEPARINUNFRC 0.33 0.28* <0.10*  CREATININE 2.07*  --   --     Estimated Creatinine Clearance: 37.3 mL/min (A) (by C-G formula based on SCr of 2.07 mg/dL (H)).   Medications:  Scheduled:  . carvedilol  12.5 mg Oral BID WC  . clopidogrel  75 mg Oral Daily  . collagenase   Topical Daily  . furosemide  40 mg Intravenous BID  . hydrALAZINE  10 mg Oral Q8H  . insulin aspart  0-15 Units Subcutaneous TID WC  . insulin aspart  0-5 Units Subcutaneous QHS  . insulin glargine  5 Units Subcutaneous BID  . isosorbide dinitrate  10 mg Oral TID  . levothyroxine  50 mcg Oral QAC breakfast  . pantoprazole  40 mg Oral QHS  . protein supplement shake  11 oz Oral BID BM  . Warfarin - Pharmacist Dosing Inpatient   Does not apply q1800   Infusions:  . sodium chloride Stopped (10/26/17 1346)  . heparin 1,450 Units/hr (11/01/17 1946)    Assessment: 76 yo M on Warfarin PTA for hx afib.  Warfarin was initially held in the setting of GIB (last dose 11/12).  GIB now resolved.  Pt was started on heparin bridging back to warfarin.  Heparin level = 0.28 on 1300 units/hr  Heparin gtt increased to 1450 units/hr this afternoon, and repeat level now undetectable.   Spoke with RN, he thinks IV was kinked/beeping and not infusing well.  Infusion was fixed and restarted around 945 PM, which was right after the level was drawn.  Goal of Therapy:  INR 2-3 Heparin level 0.3-0.5 units/ml (lower goal due to recent GIB) Monitor platelets by anticoagulation protocol: Yes   Plan:  Continue IV heparin at current rate. Recheck level with AM labs.  13/12, BCPS  Clinical Pharmacist Pager [phone]  11/01/2017 10:42 PM

## 2017-11-01 NOTE — Progress Notes (Signed)
Physical Therapy Treatment Patient Details Name: [name] MRN: [id number] DOB: January 30, 1941 Today's Date: 11/01/2017    History of Present Illness 76 diabetic, CAD, CKD stage III with ischemic cardiomyopathy status post AICD admitted 12/7 confusion, cellulitis left lower extremity and lactate of 1.8, required mechanical ventilation briefly until he was extubated 12/9, pressors weaned to off. Last admission complicated by GI bleed. Has been at rehab since that time but does not want to go back there.     PT Comments    Pt is making slow progress towards his goals, however is limited in his mobility by generalized deconditioning, and decreased safety awareness especially with use of RW. Pt is currently modA for bed mobility, and transfers and ambulation of 12 feet with RW. PT still feels SNF placement would be best for the patient given  his increased need for assistance and his primary caregiver is his 72 year old wife who is still working. PT will continue to follow acutely     Follow Up Recommendations  SNF;Supervision/Assistance - 24 hour     Equipment Recommendations  None recommended by PT    Recommendations for Other Services       Precautions / Restrictions Precautions Precautions: Fall Restrictions Weight Bearing Restrictions: No LLE Weight Bearing: Partial weight bearing LLE Partial Weight Bearing Percentage or Pounds: heel only    Mobility  Bed Mobility Overal bed mobility: Needs Assistance Bed Mobility: Supine to Sit;Sit to Supine       Sit to supine: Mod assist   General bed mobility comments: pt able to move LE off of bed but required modA for trunk to upright   Transfers Overall transfer level: Needs assistance Equipment used: Rolling walker (2 wheeled)   Sit to Stand: From elevated surface;Mod assist         General transfer comment: pt dizzy in sitting, HR 88 bpm, dizziness resolved within 1 min of sitting EoB  Ambulation/Gait Ambulation/Gait  assistance: Mod assist;+2 safety/equipment Ambulation Distance (Feet): 12 Feet Assistive device: Rolling walker (2 wheeled) Gait Pattern/deviations: Shuffle;Decreased weight shift to left;Antalgic Gait velocity: Decreased Gait velocity interpretation: Below normal speed for age/gender General Gait Details: modA for steadying with gait, maximal verbal and tactile cuing for proximity to walker and not putting pressure through L toes      Balance Overall balance assessment: Needs assistance Sitting-balance support: Feet supported;Bilateral upper extremity supported Sitting balance-Leahy Scale: Fair Sitting balance - Comments: supervision EOB. Dizzy with initial sitting but improved after 2 mins.   Standing balance support: Bilateral upper extremity supported Standing balance-Leahy Scale: Poor Standing balance comment: Reliant on BUE and minA for static standing; unable to maintain heel only PWB on LLE                            Cognition Arousal/Alertness: Awake/alert Behavior During Therapy: WFL for tasks assessed/performed Overall Cognitive Status: Within Functional Limits for tasks assessed                                               Pertinent Vitals/Pain Pain Assessment: Faces Faces Pain Scale: Hurts even more Pain Location: L foot and R knee and foot due to OA Pain Descriptors / Indicators: Grimacing;Guarding Pain Intervention(s): Limited activity within patient's tolerance;Monitored during session;Repositioned  PT Goals (current goals can now be found in the care plan section) Acute Rehab PT Goals Patient Stated Goal: get better PT Goal Formulation: With patient Time For Goal Achievement: 11/09/17 Potential to Achieve Goals: Fair Progress towards PT goals: Progressing toward goals    Frequency    Min 2X/week      PT Plan Current plan remains appropriate       AM-PAC PT "6 Clicks" Daily Activity  Outcome Measure   Difficulty turning over in bed (including adjusting bedclothes, sheets and blankets)?: Unable Difficulty moving from lying on back to sitting on the side of the bed? : Unable Difficulty sitting down on and standing up from a chair with arms (e.g., wheelchair, bedside commode, etc,.)?: Unable Help needed moving to and from a bed to chair (including a wheelchair)?: Total Help needed walking in hospital room?: Total Help needed climbing 3-5 steps with a railing? : Total 6 Click Score: 6    End of Session Equipment Utilized During Treatment: Gait belt Activity Tolerance: Patient tolerated treatment well Patient left: in bed;with call bell/phone within reach;with bed alarm set Nurse Communication: Mobility status PT Visit Diagnosis: Muscle weakness (generalized) (M62.81);Difficulty in walking, not elsewhere classified (R26.2);Pain Pain - Right/Left: Left Pain - part of body: Ankle and joints of foot     Time: 6767-2094 PT Time Calculation (min) (ACUTE ONLY): 23 min  Charges:  $Gait Training: 8-22 mins $Therapeutic Activity: 8-22 mins                    G Codes:       Nahdia Doucet B. Beverely Risen PT, DPT Acute Rehabilitation  [phone] Pager [phone]     Elon Alas Fleet 11/01/2017, 5:12 PM

## 2017-11-01 NOTE — Progress Notes (Signed)
Patient ID: [name], male   DOB: 17-Mar-1941, 76 y.o.   MRN: [id number] Patient is seen in follow-up for large eschar plantar aspect right foot.  Examination patient does have swelling there is no cellulitis no drainage no odor.  The large black eschar beneath the metatarsal heads is stable with no open wound.  Patient's nuclear medicine bone scan was reviewed which suggestive of osteomyelitis of the base of the first and second toes.  Clinically there is no sign of infection of the first or second toes.  This is not the area of the ulceration.  The CT scan of the foot shows some cystic bony changes in the hindfoot but shows no osteomyelitis or abscess in the forefoot.  I think this can be managed conservatively without surgery.  Patient will need to continue Santyl dressing changes daily at discharge nonweightbearing on the right lower extremity.  I will follow-up as an outpatient

## 2017-11-02 DIAGNOSIS — L089 Local infection of the skin and subcutaneous tissue, unspecified: Secondary | ICD-10-CM

## 2017-11-02 DIAGNOSIS — E119 Type 2 diabetes mellitus without complications: Secondary | ICD-10-CM

## 2017-11-02 DIAGNOSIS — E11628 Type 2 diabetes mellitus with other skin complications: Secondary | ICD-10-CM

## 2017-11-02 LAB — BASIC METABOLIC PANEL
Anion gap: 10 (ref 5–15)
BUN: 32 mg/dL — ABNORMAL HIGH (ref 6–20)
CALCIUM: 8.7 mg/dL — AB (ref 8.9–10.3)
CHLORIDE: 103 mmol/L (ref 101–111)
CO2: 22 mmol/L (ref 22–32)
CREATININE: 2.19 mg/dL — AB (ref 0.61–1.24)
GFR, EST AFRICAN AMERICAN: 32 mL/min — AB (ref >60)
GFR, EST NON AFRICAN AMERICAN: 27 mL/min — AB (ref >60)
Glucose, Bld: 88 mg/dL (ref 65–99)
Potassium: 3.7 mmol/L (ref 3.5–5.1)
SODIUM: 135 mmol/L (ref 135–145)

## 2017-11-02 LAB — CBC
HEMATOCRIT: 29.9 % — AB (ref 39.0–52.0)
HEMOGLOBIN: 9.1 g/dL — AB (ref 13.0–17.0)
MCH: 30.4 pg (ref 26.0–34.0)
MCHC: 30.4 g/dL (ref 30.0–36.0)
MCV: 100 fL (ref 78.0–100.0)
Platelets: 245 10*3/uL (ref 150–400)
RBC: 2.99 MIL/uL — AB (ref 4.22–5.81)
RDW: 19.5 % — AB (ref 11.5–15.5)
WBC: 6.9 10*3/uL (ref 4.0–10.5)

## 2017-11-02 LAB — PROTIME-INR
INR: 1.36
PROTHROMBIN TIME: 16.6 s — AB (ref 11.4–15.2)

## 2017-11-02 LAB — GLUCOSE, CAPILLARY
Glucose-Capillary: 94 mg/dL (ref 65–99)
Glucose-Capillary: 123 mg/dL — ABNORMAL HIGH (ref 65–99)
Glucose-Capillary: 119 mg/dL — ABNORMAL HIGH (ref 65–99)
Glucose-Capillary: 103 mg/dL — ABNORMAL HIGH (ref 65–99)

## 2017-11-02 LAB — HEPARIN LEVEL (UNFRACTIONATED): Heparin Unfractionated: 0.35 IU/mL (ref 0.30–0.70)

## 2017-11-02 MED ORDER — CAMPHOR-MENTHOL 0.5-0.5 % EX LOTN
TOPICAL_LOTION | CUTANEOUS | Status: DC | PRN
  Administered 2017-11-04: 08:00:00 via TOPICAL
  Filled 2017-11-02: qty 222

## 2017-11-02 MED ORDER — DIPHENHYDRAMINE HCL 12.5 MG/5ML PO ELIX
12.5000 mg | ORAL_SOLUTION | Freq: Four times a day (QID) | ORAL | Status: AC | PRN
  Administered 2017-11-03 (×2): 12.5 mg via ORAL
  Filled 2017-11-02 (×2): qty 10

## 2017-11-02 MED ORDER — WARFARIN SODIUM 4 MG PO TABS
4.0000 mg | ORAL_TABLET | Freq: Once | ORAL | Status: AC
  Administered 2017-11-02: 4 mg via ORAL
  Filled 2017-11-02: qty 1

## 2017-11-02 MED ORDER — DIGOXIN 125 MCG PO TABS
0.1250 mg | ORAL_TABLET | Freq: Every day | ORAL | Status: DC
  Administered 2017-11-02 – 2017-11-05 (×4): 0.125 mg via ORAL
  Filled 2017-11-02 (×5): qty 1

## 2017-11-02 MED ORDER — ZOLPIDEM TARTRATE 5 MG PO TABS
5.0000 mg | ORAL_TABLET | Freq: Once | ORAL | Status: AC
  Administered 2017-11-02: 5 mg via ORAL
  Filled 2017-11-02: qty 1

## 2017-11-02 NOTE — Progress Notes (Signed)
PROGRESS NOTE    [name]  PYK:[id number] DOB: June 28, 1941 DOA: 10/23/2017 PCP: [name], MD      Brief Narrative:  76 yo M with CAD, DM, pAF, RA, CHF EF 25% presented with DFI.   Assessment & Plan:  Active Problems:   Diabetes mellitus (HCC)   Dyslipidemia   CAD, NATIVE VESSEL   SYSTOLIC HEART FAILURE, CHRONIC   Biventricular ICD (implantable cardioverter-defibrillator) in place   Cardiomyopathy, ischemic   Paroxysmal atrial fibrillation (HCC)   CKD (chronic kidney disease) stage 3, GFR 30-59 ml/min (HCC)   PVD (peripheral vascular disease) with claudication (HCC)   Rheumatoid arthritis involving multiple sites with positive rheumatoid factor (HCC)   Plantar ulcer of left foot (HCC)   Septic shock 2/2 to LLE cellulitis/wound Completed IV vancomycin and po Cipro.  S/p revascularization of LLE. Imaging inconclusive of osteomyelitis.  Plan to monitor off antibiotics.    -Consult VVS and podiatry, appreciate cares -Consult WOC   Ischemic CM with EF 25-30% (10/13/17) -Continue Lasix -Continue BB Plavix, Isordil -Digoxin started by Cardiology  -Consult to Cardiology, appreciate cares -Continue Plavix  Paroxysmal a-fib -Continue warfarin   Type 2 NSTEMI Outpatient workup   AKI on CKD 3 Baseline 1.7, peak 2.4    Mild hyponatremia, resolved Resolved  Hypothyroidism -Continue levothyroxine  Enlarged scrotum r/o hydrocele vs hernia -scrotum U/S 10/28/17:1. Diffuse scrotal wall thickening. 2. Small right hydrocele. Small left varicocele. No evidence of testicular mass or torsion . -patient denies scrotal pain at the time of this examination  Suspected urinary retention -Start flomax -Voiding trial 12/19    DVT prophylaxis: N/A Code Status: FULL Disposition Plan: Continue diuresis.  Likely home in 2-3 days   Consultants:   Cardiology, VVS, ID, [facility]   Antimicrobials:        Subjective: Fels okay.  No new CP, fever, foot pain,  drainage, redness or swelling.  No new dyspnea, abdominal pain.  Objective: Vitals:   11/02/17 0504 11/02/17 1330 11/02/17 1707 11/02/17 2109  BP: 118/75 121/77 (!) 120/57 137/75  Pulse: 88 81 98 91  Resp: 18  18 18   Temp: (!) 97.5 F (36.4 C)  98.5 F (36.9 C) 97.8 F (36.6 C)  TempSrc: Oral  Oral Oral  SpO2: 95% 98% 98% 98%  Weight: 121.1 kg (266 lb 15.6 oz)     Height:        Intake/Output Summary (Last 24 hours) at 11/02/2017 2112 Last data filed at 11/02/2017 2110 Gross per 24 hour  Intake 830 ml  Output 2400 ml  Net -1570 ml   Filed Weights   10/31/17 0414 11/01/17 0500 11/02/17 0504  Weight: 120.3 kg (265 lb 3.4 oz) 118.2 kg (260 lb 9.3 oz) 121.1 kg (266 lb 15.6 oz)    Examination: General appearance: Elderly adult male, alert and in no acute distress. Tired.  HEENT: Anicteric, conjunctiva pink, lids and lashes normal. No nasal deformity, discharge, epistaxis.  Lips moist.   Skin: Warm and dry.  no jaundice.  No suspicious rashes or lesions. Cardiac: RRR, nl S1-S2, no murmurs appreciated.  Capillary refill is brisk.  JVP not visible.  Mild LE edema.  Radia  pulses 2+ and symmetric. Respiratory: Normal respiratory rate and rhythm.  CTAB without rales or wheezes. Abdomen: Abdomen soft.  no TTP. No ascites, distension, hepatosplenomegaly.   MSK: No deformities or effusions. Neuro: Awake and alert.  EOMI, moves all extremities. Speech fluent.    Psych: Sensorium intact and responding to questions, attention  normal. Affect normal.  Judgment and insight appear normal.    Data Reviewed: I have personally reviewed following labs and imaging studies:  CBC: Recent Labs  Lab 10/28/17 0533 11/01/17 0309 11/02/17 0515  WBC 8.8 7.6 6.9  HGB 9.1* 9.6* 9.1*  HCT 29.2* 31.0* 29.9*  MCV 99.7 102.0* 100.0  PLT 290 276 245   Basic Metabolic Panel: Recent Labs  Lab 10/27/17 1035 10/28/17 0533 10/29/17 0208 11/01/17 0309 11/02/17 0515  NA 134* 135 135 135 135  K 3.8  3.9 3.8 4.0 3.7  CL 104 104 105 105 103  CO2 20* 18* 21* 18* 22  GLUCOSE 163* 114* 148* 100* 88  BUN 52* 48* 40* 33* 32*  CREATININE 2.42* 2.29* 2.23* 2.07* 2.19*  CALCIUM 8.5* 8.8* 8.7* 8.7* 8.7*  MG 2.0  --   --   --   --   PHOS 3.3  --   --   --   --    GFR: Estimated Creatinine Clearance: 35.8 mL/min (A) (by C-G formula based on SCr of 2.19 mg/dL (H)). Liver Function Tests: Recent Labs  Lab 10/27/17 1035  AST 231*  ALT 295*  ALKPHOS 198*  BILITOT 0.7  PROT 5.6*  ALBUMIN 2.0*   No results for input(s): LIPASE, AMYLASE in the last 168 hours. No results for input(s): AMMONIA in the last 168 hours. Coagulation Profile: Recent Labs  Lab 11/02/17 0515  INR 1.36   Cardiac Enzymes: No results for input(s): CKTOTAL, CKMB, CKMBINDEX, TROPONINI in the last 168 hours. BNP (last 3 results) No results for input(s): PROBNP in the last 8760 hours. HbA1C: No results for input(s): HGBA1C in the last 72 hours. CBG: Recent Labs  Lab 11/01/17 2122 11/02/17 0812 11/02/17 1151 11/02/17 1701 11/02/17 2107  GLUCAP 83 94 123* 119* 103*   Lipid Profile: No results for input(s): CHOL, HDL, LDLCALC, TRIG, CHOLHDL, LDLDIRECT in the last 72 hours. Thyroid Function Tests: No results for input(s): TSH, T4TOTAL, FREET4, T3FREE, THYROIDAB in the last 72 hours. Anemia Panel: No results for input(s): VITAMINB12, FOLATE, FERRITIN, TIBC, IRON, RETICCTPCT in the last 72 hours. Urine analysis:    Component Value Date/Time   COLORURINE AMBER (A) 10/12/2017 0450   APPEARANCEUR CLOUDY (A) 10/12/2017 0450   LABSPEC 1.024 10/12/2017 0450   PHURINE 5.0 10/12/2017 0450   GLUCOSEU NEGATIVE 10/12/2017 0450   HGBUR LARGE (A) 10/12/2017 0450   BILIRUBINUR NEGATIVE 10/12/2017 0450   KETONESUR 5 (A) 10/12/2017 0450   PROTEINUR 100 (A) 10/12/2017 0450   UROBILINOGEN 0.2 07/05/2014 2117   NITRITE NEGATIVE 10/12/2017 0450   LEUKOCYTESUR TRACE (A) 10/12/2017 0450   Sepsis  Labs: @LABRCNTIP (procalcitonin:4,lacticidven:4)  )No results found for this or any previous visit (from the past 240 hour(s)).       Radiology Studies: No results found.      Scheduled Meds: . carvedilol  12.5 mg Oral BID WC  . clopidogrel  75 mg Oral Daily  . collagenase   Topical Daily  . digoxin  0.125 mg Oral Daily  . furosemide  40 mg Intravenous BID  . hydrALAZINE  10 mg Oral Q8H  . insulin aspart  0-15 Units Subcutaneous TID WC  . insulin aspart  0-5 Units Subcutaneous QHS  . insulin glargine  5 Units Subcutaneous BID  . isosorbide dinitrate  10 mg Oral TID  . levothyroxine  50 mcg Oral QAC breakfast  . pantoprazole  40 mg Oral QHS  . protein supplement shake  11 oz Oral BID BM  .  Warfarin - Pharmacist Dosing Inpatient   Does not apply q1800  . zolpidem  5 mg Oral Once   Continuous Infusions: . sodium chloride Stopped (10/26/17 1346)  . heparin 1,450 Units/hr (11/02/17 1722)     LOS: 10 days    Time spent: 45 minutes    Alberteen Sam, MD [facility] Pager [phone]  If 7PM-7AM, please contact night-coverage [email or web address] Password [email or web address] 11/02/2017, 9:12 PM

## 2017-11-02 NOTE — Progress Notes (Signed)
Paged Dr. Maryfrances Bunnell in regards to continuation of foley. Awaiting further orders. Will continue to monitor.

## 2017-11-02 NOTE — Progress Notes (Signed)
ANTICOAGULATION CONSULT NOTE - Follow Up Consult  Pharmacy Consult for Heparin and Warfarin Indication: atrial fibrillation  Allergies  Allergen Reactions  . Allegra [Fexofenadine] Itching  . Crestor [Rosuvastatin Calcium] Other (See Comments)    Body aches  . Codeine Other (See Comments)    blisters, but able to take hydrocodone    Patient Measurements: Height: 5\' 8"  (172.7 cm) Weight: 266 lb 15.6 oz (121.1 kg) IBW/kg (Calculated) : 68.4 Heparin Dosing Weight: 95 kg  Vital Signs: Temp: 97.5 F (36.4 C) (12/18 0504) Temp Source: Oral (12/18 0504) BP: 118/75 (12/18 0504) Pulse Rate: 88 (12/18 0504)  Labs: Recent Labs    11/01/17 0309 11/01/17 1253 11/01/17 2112 11/02/17 0515  HGB 9.6*  --   --  9.1*  HCT 31.0*  --   --  29.9*  PLT 276  --   --  245  LABPROT  --   --   --  16.6*  INR  --   --   --  1.36  HEPARINUNFRC 0.33 0.28* <0.10* 0.35  CREATININE 2.07*  --   --  2.19*    Estimated Creatinine Clearance: 35.8 mL/min (A) (by C-G formula based on SCr of 2.19 mg/dL (H)).   Medications:  Scheduled:  . carvedilol  12.5 mg Oral BID WC  . clopidogrel  75 mg Oral Daily  . collagenase   Topical Daily  . furosemide  40 mg Intravenous BID  . hydrALAZINE  10 mg Oral Q8H  . insulin aspart  0-15 Units Subcutaneous TID WC  . insulin aspart  0-5 Units Subcutaneous QHS  . insulin glargine  5 Units Subcutaneous BID  . isosorbide dinitrate  10 mg Oral TID  . levothyroxine  50 mcg Oral QAC breakfast  . pantoprazole  40 mg Oral QHS  . protein supplement shake  11 oz Oral BID BM  . Warfarin - Pharmacist Dosing Inpatient   Does not apply q1800   Infusions:  . sodium chloride Stopped (10/26/17 1346)  . heparin 1,450 Units/hr (11/01/17 1946)    Assessment: 76 yo M on Warfarin PTA for hx afib.  Warfarin was initially held in the setting of GIB (last dose 11/12).  GIB now resolved.  Pt was started on heparin bridging back to warfarin.  Heparin level = 0.35 on 1450  units/hr.  INR 1.36.  Goal of Therapy:  INR 2-3 Heparin level 0.3-0.5 units/ml (lower goal due to recent GIB) Monitor platelets by anticoagulation protocol: Yes   Plan:  Continue heparin infusion at 1450 units/hr Repeat Warfarin 4mg  PO x 1 tonight. Daily heparin level, CBC, and INR  13/12, Pharm.D., BCPS Clinical Pharmacist Pager: [phone] Clinical phone for 11/02/2017 from 8:30-4:00 is [phone]. After 4pm, please call Main Rx [phone]) for assistance. 11/02/2017 9:14 AM

## 2017-11-02 NOTE — Progress Notes (Signed)
Progress Note  Patient Name: [name] Date of Encounter: 11/02/2017  Primary Cardiologist: Dr. Diona Browner, Dr. Johney Frame (EP)   Subjective   No CP or dyspnea  Inpatient Medications    Scheduled Meds: . carvedilol  12.5 mg Oral BID WC  . clopidogrel  75 mg Oral Daily  . collagenase   Topical Daily  . furosemide  40 mg Intravenous BID  . hydrALAZINE  10 mg Oral Q8H  . insulin aspart  0-15 Units Subcutaneous TID WC  . insulin aspart  0-5 Units Subcutaneous QHS  . insulin glargine  5 Units Subcutaneous BID  . isosorbide dinitrate  10 mg Oral TID  . levothyroxine  50 mcg Oral QAC breakfast  . pantoprazole  40 mg Oral QHS  . protein supplement shake  11 oz Oral BID BM  . warfarin  4 mg Oral ONCE-1800  . Warfarin - Pharmacist Dosing Inpatient   Does not apply q1800   Continuous Infusions: . sodium chloride Stopped (10/26/17 1346)  . heparin 1,450 Units/hr (11/01/17 1946)   PRN Meds: sodium chloride, acetaminophen (TYLENOL) oral liquid 160 mg/5 mL   Vital Signs    Vitals:   11/01/17 0556 11/01/17 1433 11/01/17 2122 11/02/17 0504  BP: (!) 145/86 139/85 127/82 118/75  Pulse: 94 90 93 88  Resp: 18 15 18 18   Temp: 98.1 F (36.7 C)  (!) 97.3 F (36.3 C) (!) 97.5 F (36.4 C)  TempSrc: Oral  Oral Oral  SpO2: 95% 97% 97% 95%  Weight:    266 lb 15.6 oz (121.1 kg)  Height:        Intake/Output Summary (Last 24 hours) at 11/02/2017 1150 Last data filed at 11/02/2017 0954 Gross per 24 hour  Intake 468 ml  Output 200 ml  Net 268 ml   Filed Weights   10/31/17 0414 11/01/17 0500 11/02/17 0504  Weight: 265 lb 3.4 oz (120.3 kg) 260 lb 9.3 oz (118.2 kg) 266 lb 15.6 oz (121.1 kg)     Physical Exam   General: WD/obese NAD Head: Normal  Neck: Supple Lungs:  CTA Heart: irregular Abdomen: Soft, NT/ND, no masses Extremities: 1+ edema. Left foot wrapped Neuro: Grossly intact  Labs    Chemistry Recent Labs  Lab 10/27/17 1035  10/29/17 0208 11/01/17 0309  11/02/17 0515  NA 134*   < > 135 135 135  K 3.8   < > 3.8 4.0 3.7  CL 104   < > 105 105 103  CO2 20*   < > 21* 18* 22  GLUCOSE 163*   < > 148* 100* 88  BUN 52*   < > 40* 33* 32*  CREATININE 2.42*   < > 2.23* 2.07* 2.19*  CALCIUM 8.5*   < > 8.7* 8.7* 8.7*  PROT 5.6*  --   --   --   --   ALBUMIN 2.0*  --   --   --   --   AST 231*  --   --   --   --   ALT 295*  --   --   --   --   ALKPHOS 198*  --   --   --   --   BILITOT 0.7  --   --   --   --   GFRNONAA 24*   < > 27* 29* 27*  GFRAA 28*   < > 31* 34* 32*  ANIONGAP 10   < > 9 12 10    < > =  values in this interval not displayed.     Hematology Recent Labs  Lab 10/28/17 0533 11/01/17 0309 11/02/17 0515  WBC 8.8 7.6 6.9  RBC 2.93* 3.04* 2.99*  HGB 9.1* 9.6* 9.1*  HCT 29.2* 31.0* 29.9*  MCV 99.7 102.0* 100.0  MCH 31.1 31.6 30.4  MCHC 31.2 31.0 30.4  RDW 19.3* 20.2* 19.5*  PLT 290 276 245    Telemetry    Afib, ventricular pacing, rate high normal - Personally Reviewed    Cardiac Studies   Echo 10/13/17: Study Conclusions - Left ventricle: Diffuse hypokinesis worse in inferior wall and   abnormal septal motion The cavity size was moderately dilated.   Wall thickness was increased in a pattern of severe LVH. Systolic   function was severely reduced. The estimated ejection fraction   was in the range of 25% to 30%. Doppler parameters are consistent   with abnormal left ventricular relaxation (grade 1 diastolic   dysfunction). - Mitral valve: Calcified annulus. Mildly thickened leaflets .   There was mild regurgitation. - Atrial septum: No defect or patent foramen ovale was identified.   Echo 03/03/17: Study Conclusions - Procedure narrative: Transthoracic echocardiography. Image   quality was suboptimal. The study was technically difficult, as a   result of poor sound wave transmission and body habitus. - Left ventricle: The cavity size was normal. Wall thickness was   increased in a pattern of moderate LVH.  Systolic function was   normal. The estimated ejection fraction was in the range of 55%   to 60%. Images were inadequate for LV wall motion assessment. No   gross regional variation on apical 4-chamber views. Doppler   parameters are consistent with abnormal left ventricular   relaxation (grade 1 diastolic dysfunction). Doppler parameters   are consistent with high ventricular filling pressure. - Aortic valve: Mildly to moderately calcified annulus. - Mitral valve: Mildly calcified annulus. There was mild   regurgitation. - Left atrium: The atrium was moderately dilated. - Right ventricle: Pacer wire or catheter noted in right ventricle.  Patient Profile     76 y.o. male admitted with altered mental status and respiratory failure. He hasBiVAICD and PAF, elevated troponin PAF.EP interrogated device and reported he had been in Afib since approximately 09/19/17. LV function is worse compared to previous echo (echocardiogram November 28 showed ejection fraction 25-30%, grade 1 diastolic dysfunction and mild mitral regurgitation).   Assessment & Plan     1 acute on chronic systolic congestive heart failure-patient remains volume overloaded but improving; I/O inaccurate.  Continue Lasix 40 mg IV twice daily.  Follow renal function closely with diuresis.  Continue fluid restriction to 1.5 L daily and low-sodium diet as well.  2 ischemic cardiomyopathy-previous echocardiogram April 2018 showed his LV function had normalized.  It is now decreased again.  Question if this may be related to atrial fibrillation with rapid ventricular response.  Could also be ischemia mediated given non-ST elevation myocardial infarction.  Continue carvedilol 12.5 mg twice daily.  Add digoxin 0.125 mg daily. Continue hydralazine/nitrates.  We will avoid ACE inhibitor or ARB given renal insufficiency.  3 non-ST elevation myocardial infarction-continue Plavix and beta-blocker.  He apparently is intolerant to statins.  He  will need ischemia evaluation when he recovers from this hospitalization.  4 paroxysmal atrial fibrillation-patient remains in atrial fibrillation.  Rate is increased at times.  Continue present dose of coreg; add digoxin 0.125 mg daily.  Continue heparin until INR is therapeutic.  Continue Coumadin.  If  we have a difficult time managing congestive heart failure despite rate control he may benefit from TEE guided cardioversion.  Otherwise could consider cardioverting 4-6 weeks after INR therapeutic.  5 status post ICD  6 peripheral vascular disease and possible cellulitis-management per primary care.    7 chronic stage III kidney disease-follow renal function closely with diuresis.  Olga Millers, MD

## 2017-11-03 LAB — BASIC METABOLIC PANEL
Anion gap: 13 (ref 5–15)
BUN: 39 mg/dL — AB (ref 6–20)
CHLORIDE: 100 mmol/L — AB (ref 101–111)
CO2: 22 mmol/L (ref 22–32)
CREATININE: 2.23 mg/dL — AB (ref 0.61–1.24)
Calcium: 8.8 mg/dL — ABNORMAL LOW (ref 8.9–10.3)
GFR calc Af Amer: 31 mL/min — ABNORMAL LOW (ref >60)
GFR calc non Af Amer: 27 mL/min — ABNORMAL LOW (ref >60)
Glucose, Bld: 95 mg/dL (ref 65–99)
Potassium: 3.8 mmol/L (ref 3.5–5.1)
SODIUM: 135 mmol/L (ref 135–145)

## 2017-11-03 LAB — HEPARIN LEVEL (UNFRACTIONATED)
Heparin Unfractionated: 0.16 IU/mL — ABNORMAL LOW (ref 0.30–0.70)
Heparin Unfractionated: 0.16 IU/mL — ABNORMAL LOW (ref 0.30–0.70)

## 2017-11-03 LAB — CBC
HCT: 31 % — ABNORMAL LOW (ref 39.0–52.0)
Hemoglobin: 9.4 g/dL — ABNORMAL LOW (ref 13.0–17.0)
MCH: 30.1 pg (ref 26.0–34.0)
MCHC: 30.3 g/dL (ref 30.0–36.0)
MCV: 99.4 fL (ref 78.0–100.0)
PLATELETS: 272 10*3/uL (ref 150–400)
RBC: 3.12 MIL/uL — AB (ref 4.22–5.81)
RDW: 19.4 % — ABNORMAL HIGH (ref 11.5–15.5)
WBC: 7.6 10*3/uL (ref 4.0–10.5)

## 2017-11-03 LAB — GLUCOSE, CAPILLARY
GLUCOSE-CAPILLARY: 85 mg/dL (ref 65–99)
GLUCOSE-CAPILLARY: 121 mg/dL — AB (ref 65–99)
GLUCOSE-CAPILLARY: 139 mg/dL — AB (ref 65–99)
GLUCOSE-CAPILLARY: 182 mg/dL — AB (ref 65–99)
GLUCOSE-CAPILLARY: 153 mg/dL — AB (ref 65–99)

## 2017-11-03 LAB — PROTIME-INR
INR: 1.35
PROTHROMBIN TIME: 16.5 s — AB (ref 11.4–15.2)

## 2017-11-03 MED ORDER — TAMSULOSIN HCL 0.4 MG PO CAPS
0.4000 mg | ORAL_CAPSULE | Freq: Every day | ORAL | Status: DC
  Administered 2017-11-03 – 2017-11-06 (×4): 0.4 mg via ORAL
  Filled 2017-11-03 (×4): qty 1

## 2017-11-03 MED ORDER — HEPARIN (PORCINE) IN NACL 100-0.45 UNIT/ML-% IJ SOLN
1900.0000 [IU]/h | INTRAMUSCULAR | Status: DC
  Administered 2017-11-03 (×2): 1750 [IU]/h via INTRAVENOUS
  Administered 2017-11-04: 1900 [IU]/h via INTRAVENOUS
  Filled 2017-11-03 (×2): qty 250

## 2017-11-03 MED ORDER — WARFARIN SODIUM 5 MG PO TABS
5.0000 mg | ORAL_TABLET | Freq: Once | ORAL | Status: AC
  Administered 2017-11-03: 5 mg via ORAL
  Filled 2017-11-03: qty 1

## 2017-11-03 MED ORDER — SODIUM CHLORIDE 0.9% FLUSH
10.0000 mL | INTRAVENOUS | Status: DC | PRN
  Administered 2017-11-05: 10 mL
  Filled 2017-11-03: qty 40

## 2017-11-03 NOTE — Progress Notes (Signed)
ANTICOAGULATION CONSULT NOTE - Follow Up Consult  Pharmacy Consult for Heparin and Warfarin Indication: atrial fibrillation  Allergies  Allergen Reactions  . Allegra [Fexofenadine] Itching  . Crestor [Rosuvastatin Calcium] Other (See Comments)    Body aches  . Codeine Other (See Comments)    blisters, but able to take hydrocodone    Patient Measurements: Height: 5\' 8"  (172.7 cm) Weight: 253 lb 4.9 oz (114.9 kg) IBW/kg (Calculated) : 68.4 Heparin Dosing Weight: 94 kg  Vital Signs: Temp: 97.9 F (36.6 C) (12/19 1329) Temp Source: Oral (12/19 1329) BP: 135/88 (12/19 1329) Pulse Rate: 92 (12/19 1329)  Labs: Recent Labs    11/01/17 0309  11/02/17 0515 11/03/17 0352 11/03/17 0550 11/03/17 1219  HGB 9.6*  --  9.1* 9.4*  --   --   HCT 31.0*  --  29.9* 31.0*  --   --   PLT 276  --  245 272  --   --   LABPROT  --   --  16.6* 16.5*  --   --   INR  --   --  1.36 1.35  --   --   HEPARINUNFRC 0.33   < > 0.35 0.16*  --  0.16*  CREATININE 2.07*  --  2.19*  --  2.23*  --    < > = values in this interval not displayed.    Estimated Creatinine Clearance: 34.1 mL/min (A) (by C-G formula based on SCr of 2.23 mg/dL (H)).   Medications:  Scheduled:  . carvedilol  12.5 mg Oral BID WC  . clopidogrel  75 mg Oral Daily  . collagenase   Topical Daily  . digoxin  0.125 mg Oral Daily  . furosemide  40 mg Intravenous BID  . hydrALAZINE  10 mg Oral Q8H  . insulin aspart  0-15 Units Subcutaneous TID WC  . insulin aspart  0-5 Units Subcutaneous QHS  . insulin glargine  5 Units Subcutaneous BID  . isosorbide dinitrate  10 mg Oral TID  . levothyroxine  50 mcg Oral QAC breakfast  . pantoprazole  40 mg Oral QHS  . protein supplement shake  11 oz Oral BID BM  . tamsulosin  0.4 mg Oral Daily  . Warfarin - Pharmacist Dosing Inpatient   Does not apply q1800   Infusions:  . sodium chloride Stopped (10/26/17 1346)  . heparin 1,700 Units/hr (11/03/17 1238)    Assessment: 76 yo M on  Warfarin PTA for hx afib.  Warfarin was initially held in the setting of GIB (last dose 11/12).  GIB now resolved.  Pt was started on heparin bridging back to warfarin.  7 hour Heparin level = 0.16, remains below goal after heparin rate increased to 1700 units/hr.  However, RN reports that heparin drip was stopped at 11:41 and restarted at 12:38. The level was drawn at 12:19 while heparin drip off, thus level is not helpful, doesn't accurately reflect the 1700 unit/hr rate. Will increase heparin rate some as time heparin drip appears to have been stopped was only about 30 minutes.  INR = 1.35 after two days of 4mg  coumadin. Hgb 9.4 stable, pltc within normal . No bleeding reported.   Goal of Therapy:  INR 2-3 Heparin level 0.3-0.5 units/ml (lower goal due to recent GIB) Monitor platelets by anticoagulation protocol: Yes   Plan:  Increase heparin infusion to 1750 units/hr Give Warfarin 5mg  PO x 1 tonight. Check 6 hour heparin level Daily heparin level, CBC, and INR   Thank you  for allowing pharmacy to be part of this patients care team.  [name], RPh Clinical Pharmacist Clinical phone for 11/03/2017 from 8:30-4:00 is [phone]. After 4pm, please call Main [facility] [phone]) for assistance. 11/03/2017 1:30 PM

## 2017-11-03 NOTE — Progress Notes (Signed)
Physical Therapy Treatment Patient Details Name: [name] MRN: [id number] DOB: Apr 15, 1941 Today's Date: 11/03/2017    History of Present Illness 76 diabetic, CAD, CKD stage III with ischemic cardiomyopathy status post AICD admitted 12/7 confusion, cellulitis left lower extremity and lactate of 1.8, required mechanical ventilation briefly until he was extubated 12/9, pressors weaned to off. Last admission complicated by GI bleed. Has been at rehab since that time but does not want to go back there.     PT Comments    Pt is making slow progress towards his goals and is no longer experiencing dizziness with positional change. Pt is still limited by generalized weakness, decreased safety awareness and decreased endurance. Pt currently minA for bed mobility, and modAx2 for transfers and ambulation of 12 feet with RW. PT continues to recommend SNF level care at d/c. PT will continue to follow acutely.      Follow Up Recommendations  SNF;Supervision/Assistance - 24 hour     Equipment Recommendations  None recommended by PT    Recommendations for Other Services       Precautions / Restrictions Precautions Precautions: Fall Restrictions Weight Bearing Restrictions: No LLE Weight Bearing: Partial weight bearing LLE Partial Weight Bearing Percentage or Pounds: heel only    Mobility  Bed Mobility Overal bed mobility: Needs Assistance Bed Mobility: Supine to Sit;Sit to Supine       Sit to supine: Min assist   General bed mobility comments: minA for trunk to upright   Transfers Overall transfer level: Needs assistance Equipment used: Rolling walker (2 wheeled)   Sit to Stand: From elevated surface;Mod assist;+2 physical assistance         General transfer comment: modAx2 for powerup to RW, vc for hand placement and powerup   Ambulation/Gait Ambulation/Gait assistance: Mod assist;+2 safety/equipment Ambulation Distance (Feet): 12 Feet Assistive device: Rolling walker (2  wheeled) Gait Pattern/deviations: Shuffle;Decreased weight shift to left;Antalgic Gait velocity: Decreased Gait velocity interpretation: Below normal speed for age/gender General Gait Details: modA for steadying with gait, continues to require maximal verbal and tactile cuing for proximity to walker and not putting pressure through L toes, pt very resistant to cuing and correcting form for safety        Balance Overall balance assessment: Needs assistance Sitting-balance support: Feet supported Sitting balance-Leahy Scale: Good     Standing balance support: Bilateral upper extremity supported Standing balance-Leahy Scale: Poor Standing balance comment: Reliant on BUE and minA for static standing; unable to maintain heel only PWB on LLE                            Cognition Arousal/Alertness: Awake/alert Behavior During Therapy: WFL for tasks assessed/performed Overall Cognitive Status: Within Functional Limits for tasks assessed                                           General Comments General comments (skin integrity, edema, etc.): Pt only agreeable to PT helping him up to chair so he could eat breakfast when his wife arrived. Pt overall resistant to SNF level care despite his difficutly with mobility. Pt wife present during end of session and discussed discharge recommendation of SNF. Wife indicated she thought would be helpful but pt had a poor experience before and she didn't think he would go back      Pertinent  Vitals/Pain Pain Assessment: Faces Faces Pain Scale: Hurts even more Pain Location: L foot and R knee and foot due to OA Pain Descriptors / Indicators: Grimacing;Guarding Pain Intervention(s): Limited activity within patient's tolerance;Monitored during session;Repositioned           PT Goals (current goals can now be found in the care plan section) Acute Rehab PT Goals Patient Stated Goal: get better PT Goal Formulation: With  patient Time For Goal Achievement: 11/09/17 Potential to Achieve Goals: Fair Progress towards PT goals: Progressing toward goals    Frequency    Min 2X/week      PT Plan Current plan remains appropriate    Co-evaluation              AM-PAC PT "6 Clicks" Daily Activity  Outcome Measure  Difficulty turning over in bed (including adjusting bedclothes, sheets and blankets)?: Unable Difficulty moving from lying on back to sitting on the side of the bed? : Unable Difficulty sitting down on and standing up from a chair with arms (e.g., wheelchair, bedside commode, etc,.)?: Unable Help needed moving to and from a bed to chair (including a wheelchair)?: Total Help needed walking in hospital room?: Total Help needed climbing 3-5 steps with a railing? : Total 6 Click Score: 6    End of Session Equipment Utilized During Treatment: Gait belt Activity Tolerance: Patient tolerated treatment well Patient left: with call bell/phone within reach;with bed alarm set;in chair;with family/visitor present Nurse Communication: Mobility status PT Visit Diagnosis: Muscle weakness (generalized) (M62.81);Difficulty in walking, not elsewhere classified (R26.2);Pain Pain - Right/Left: Left Pain - part of body: Ankle and joints of foot     Time: 0951-1005 PT Time Calculation (min) (ACUTE ONLY): 14 min  Charges:  $Gait Training: 8-22 mins                    G Codes:       Chaquana Nichols B. Beverely Risen PT, DPT Acute Rehabilitation  [phone] Pager [phone]     Elon Alas Fleet 11/03/2017, 12:19 PM

## 2017-11-03 NOTE — Progress Notes (Signed)
bp 153/125 provider aware

## 2017-11-03 NOTE — Progress Notes (Signed)
PROGRESS NOTE    [name]  QZE:[id number] DOB: 12-14-1940 DOA: 10/23/2017 PCP: [name], MD      Brief Narrative:  76 yo M with CAD, DM, pAF, RA, CHF EF 25% presented with DFI.   Assessment & Plan:  Active Problems:   Diabetes mellitus (HCC)   Dyslipidemia   CAD, NATIVE VESSEL   SYSTOLIC HEART FAILURE, CHRONIC   Biventricular ICD (implantable cardioverter-defibrillator) in place   Cardiomyopathy, ischemic   Paroxysmal atrial fibrillation (HCC)   CKD (chronic kidney disease) stage 3, GFR 30-59 ml/min (HCC)   PVD (peripheral vascular disease) with claudication (HCC)   Rheumatoid arthritis involving multiple sites with positive rheumatoid factor (HCC)   Plantar ulcer of left foot (HCC)   Septic shock 2/2 to LLE cellulitis/wound Completed IV vancomycin and PO Cipro.  S/p revascularization of LLE. Imaging inconclusive of osteomyelitis.  Plan to monitor off antibiotics.    -Consult VVS and podiatry, appreciate cares -Consult WOC   Ischemic CM with EF 25-30% (10/13/17) -Continue Lasix -Continue BB, Plavix, Isordil, digoxin -Continue Plavix -Consult to Cardiology, appreciate cares  Paroxysmal a-fib -Continue warfarin with heparin bridge  Type 2 NSTEMI Outpatient workup   AKI on CKD 3 Baseline 1.7, peak 2.4    Mild hyponatremia, resolved Resolved  Hypothyroidism -Continue levothyroxine  Enlarged scrotum r/o hydrocele vs hernia -scrotum U/S 10/28/17:1. Diffuse scrotal wall thickening. 2. Small right hydrocele. Small left varicocele. No evidence of testicular mass or torsion . -patient denies scrotal pain at the time of this examination  Suspected urinary retention -Start Flomax -Trial without foley today    DVT prophylaxis: N/A Code Status: FULL Disposition Plan: Continue diuresis.  Likely home in 2-3 days, will need to complete heparin bridge or change to Lovenox.   Consultants:   Cardiology, VVS, ID, [facility]         Subjective: No change.  No new chest pain, fever, foot pain, drainage, redness or swelling.  No new dyspnea, abdominal pain.  Objective: Vitals:   11/03/17 0458 11/03/17 1137 11/03/17 1329 11/03/17 1824  BP: 107/74 (!) 153/125 135/88 (!) 132/101  Pulse: 98  92   Resp: 19  20   Temp: (!) 97.5 F (36.4 C)  97.9 F (36.6 C)   TempSrc: Oral  Oral   SpO2: 97% 96% 99%   Weight: 114.9 kg (253 lb 4.9 oz)     Height:        Intake/Output Summary (Last 24 hours) at 11/03/2017 1854 Last data filed at 11/03/2017 1328 Gross per 24 hour  Intake 222 ml  Output 1600 ml  Net -1378 ml   Filed Weights   11/01/17 0500 11/02/17 0504 11/03/17 0458  Weight: 118.2 kg (260 lb 9.3 oz) 121.1 kg (266 lb 15.6 oz) 114.9 kg (253 lb 4.9 oz)    Examination: General appearance: Elderly adult male, alert and in no acute distress. Tired, pretty listless.  HEENT: Anicteric, conjunctiva pink, lids and lashes normal. No nasal deformity, discharge, epistaxis.  Lips moist.   Skin: Warm and dry.  no jaundice.  No suspicious rashes or lesions. Cardiac: RRR, nl S1-S2, no murmurs appreciated.  Capillary refill is brisk.  JVP not visible.  Mild LE edema.  Radia  pulses 2+ and symmetric. Respiratory: Normal respiratory rate and rhythm.  CTAB without rales or wheezes. Abdomen: Abdomen soft.  no TTP. No ascites, distension, hepatosplenomegaly.   MSK: No deformities or effusions. Neuro: Awake and alert.  EOMI, moves all extremities. Speech fluent.    Psych:  Sensorium intact and responding to questions, attention normal. Affect normal.  Judgment and insight appear normal.    Data Reviewed: I have personally reviewed following labs and imaging studies:  CBC: Recent Labs  Lab 10/28/17 0533 11/01/17 0309 11/02/17 0515 11/03/17 0352  WBC 8.8 7.6 6.9 7.6  HGB 9.1* 9.6* 9.1* 9.4*  HCT 29.2* 31.0* 29.9* 31.0*  MCV 99.7 102.0* 100.0 99.4  PLT 290 276 245 272   Basic Metabolic Panel: Recent Labs  Lab  10/28/17 0533 10/29/17 0208 11/01/17 0309 11/02/17 0515 11/03/17 0550  NA 135 135 135 135 135  K 3.9 3.8 4.0 3.7 3.8  CL 104 105 105 103 100*  CO2 18* 21* 18* 22 22  GLUCOSE 114* 148* 100* 88 95  BUN 48* 40* 33* 32* 39*  CREATININE 2.29* 2.23* 2.07* 2.19* 2.23*  CALCIUM 8.8* 8.7* 8.7* 8.7* 8.8*   GFR: Estimated Creatinine Clearance: 34.1 mL/min (A) (by C-G formula based on SCr of 2.23 mg/dL (H)). Liver Function Tests: No results for input(s): AST, ALT, ALKPHOS, BILITOT, PROT, ALBUMIN in the last 168 hours. No results for input(s): LIPASE, AMYLASE in the last 168 hours. No results for input(s): AMMONIA in the last 168 hours. Coagulation Profile: Recent Labs  Lab 11/02/17 0515 11/03/17 0352  INR 1.36 1.35   Cardiac Enzymes: No results for input(s): CKTOTAL, CKMB, CKMBINDEX, TROPONINI in the last 168 hours. BNP (last 3 results) No results for input(s): PROBNP in the last 8760 hours. HbA1C: No results for input(s): HGBA1C in the last 72 hours. CBG: Recent Labs  Lab 11/02/17 2107 11/03/17 0741 11/03/17 1154 11/03/17 1612 11/03/17 1808  GLUCAP 103* 85 121* 139* 182*   Lipid Profile: No results for input(s): CHOL, HDL, LDLCALC, TRIG, CHOLHDL, LDLDIRECT in the last 72 hours. Thyroid Function Tests: No results for input(s): TSH, T4TOTAL, FREET4, T3FREE, THYROIDAB in the last 72 hours. Anemia Panel: No results for input(s): VITAMINB12, FOLATE, FERRITIN, TIBC, IRON, RETICCTPCT in the last 72 hours. Urine analysis:    Component Value Date/Time   COLORURINE AMBER (A) 10/12/2017 0450   APPEARANCEUR CLOUDY (A) 10/12/2017 0450   LABSPEC 1.024 10/12/2017 0450   PHURINE 5.0 10/12/2017 0450   GLUCOSEU NEGATIVE 10/12/2017 0450   HGBUR LARGE (A) 10/12/2017 0450   BILIRUBINUR NEGATIVE 10/12/2017 0450   KETONESUR 5 (A) 10/12/2017 0450   PROTEINUR 100 (A) 10/12/2017 0450   UROBILINOGEN 0.2 07/05/2014 2117   NITRITE NEGATIVE 10/12/2017 0450   LEUKOCYTESUR TRACE (A) 10/12/2017  0450   Sepsis Labs: @LABRCNTIP (procalcitonin:4,lacticidven:4)  )No results found for this or any previous visit (from the past 240 hour(s)).       Radiology Studies: No results found.      Scheduled Meds: . carvedilol  12.5 mg Oral BID WC  . clopidogrel  75 mg Oral Daily  . collagenase   Topical Daily  . digoxin  0.125 mg Oral Daily  . furosemide  40 mg Intravenous BID  . hydrALAZINE  10 mg Oral Q8H  . insulin aspart  0-15 Units Subcutaneous TID WC  . insulin aspart  0-5 Units Subcutaneous QHS  . insulin glargine  5 Units Subcutaneous BID  . isosorbide dinitrate  10 mg Oral TID  . levothyroxine  50 mcg Oral QAC breakfast  . pantoprazole  40 mg Oral QHS  . protein supplement shake  11 oz Oral BID BM  . tamsulosin  0.4 mg Oral Daily  . Warfarin - Pharmacist Dosing Inpatient   Does not apply q1800   Continuous  Infusions: . sodium chloride Stopped (10/26/17 1346)  . heparin 1,750 Units/hr (11/03/17 1437)     LOS: 11 days    Time spent: 15 minutes    Alberteen Sam, MD [facility] Pager [phone]  If 7PM-7AM, please contact night-coverage [email or web address] Password [facility] 11/03/2017, 6:54 PM

## 2017-11-03 NOTE — Progress Notes (Signed)
Progress Note  Patient Name: [name] Date of Encounter: 11/03/2017  Primary Cardiologist: Dr. Diona Browner, Dr. Johney Frame (EP)   Subjective   Denies CP or dyspnea  Inpatient Medications    Scheduled Meds: . carvedilol  12.5 mg Oral BID WC  . clopidogrel  75 mg Oral Daily  . collagenase   Topical Daily  . digoxin  0.125 mg Oral Daily  . furosemide  40 mg Intravenous BID  . hydrALAZINE  10 mg Oral Q8H  . insulin aspart  0-15 Units Subcutaneous TID WC  . insulin aspart  0-5 Units Subcutaneous QHS  . insulin glargine  5 Units Subcutaneous BID  . isosorbide dinitrate  10 mg Oral TID  . levothyroxine  50 mcg Oral QAC breakfast  . pantoprazole  40 mg Oral QHS  . protein supplement shake  11 oz Oral BID BM  . Warfarin - Pharmacist Dosing Inpatient   Does not apply q1800   Continuous Infusions: . sodium chloride Stopped (10/26/17 1346)  . heparin 1,700 Units/hr (11/03/17 0520)   PRN Meds: sodium chloride, acetaminophen (TYLENOL) oral liquid 160 mg/5 mL, camphor-menthol, diphenhydrAMINE   Vital Signs    Vitals:   11/02/17 1330 11/02/17 1707 11/02/17 2109 11/03/17 0458  BP: 121/77 (!) 120/57 137/75 107/74  Pulse: 81 98 91 98  Resp:  18 18 19   Temp:  98.5 F (36.9 C) 97.8 F (36.6 C) (!) 97.5 F (36.4 C)  TempSrc:  Oral Oral Oral  SpO2: 98% 98% 98% 97%  Weight:    253 lb 4.9 oz (114.9 kg)  Height:        Intake/Output Summary (Last 24 hours) at 11/03/2017 0939 Last data filed at 11/03/2017 0718 Gross per 24 hour  Intake 562 ml  Output 3400 ml  Net -2838 ml   Filed Weights   11/01/17 0500 11/02/17 0504 11/03/17 0458  Weight: 260 lb 9.3 oz (118.2 kg) 266 lb 15.6 oz (121.1 kg) 253 lb 4.9 oz (114.9 kg)     Physical Exam   General: NAD Head: Normal Neck: Supple, no JVD Lungs:  CTA; no wheeze Heart: irregular, no murmur Abdomen: NT, ND Extremities: 1+ edema Neuro: no focal findings  Labs    Chemistry Recent Labs  Lab 10/27/17 1035  11/01/17 0309  11/02/17 0515 11/03/17 0550  NA 134*   < > 135 135 135  K 3.8   < > 4.0 3.7 3.8  CL 104   < > 105 103 100*  CO2 20*   < > 18* 22 22  GLUCOSE 163*   < > 100* 88 95  BUN 52*   < > 33* 32* 39*  CREATININE 2.42*   < > 2.07* 2.19* 2.23*  CALCIUM 8.5*   < > 8.7* 8.7* 8.8*  PROT 5.6*  --   --   --   --   ALBUMIN 2.0*  --   --   --   --   AST 231*  --   --   --   --   ALT 295*  --   --   --   --   ALKPHOS 198*  --   --   --   --   BILITOT 0.7  --   --   --   --   GFRNONAA 24*   < > 29* 27* 27*  GFRAA 28*   < > 34* 32* 31*  ANIONGAP 10   < > 12 10 13    < > =  values in this interval not displayed.     Hematology Recent Labs  Lab 11/01/17 0309 11/02/17 0515 11/03/17 0352  WBC 7.6 6.9 7.6  RBC 3.04* 2.99* 3.12*  HGB 9.6* 9.1* 9.4*  HCT 31.0* 29.9* 31.0*  MCV 102.0* 100.0 99.4  MCH 31.6 30.4 30.1  MCHC 31.0 30.4 30.3  RDW 20.2* 19.5* 19.4*  PLT 276 245 272    Telemetry    Afib with v pacing - Personally Reviewed    Cardiac Studies   Echo 10/13/17: Study Conclusions - Left ventricle: Diffuse hypokinesis worse in inferior wall and   abnormal septal motion The cavity size was moderately dilated.   Wall thickness was increased in a pattern of severe LVH. Systolic   function was severely reduced. The estimated ejection fraction   was in the range of 25% to 30%. Doppler parameters are consistent   with abnormal left ventricular relaxation (grade 1 diastolic   dysfunction). - Mitral valve: Calcified annulus. Mildly thickened leaflets .   There was mild regurgitation. - Atrial septum: No defect or patent foramen ovale was identified.   Echo 03/03/17: Study Conclusions - Procedure narrative: Transthoracic echocardiography. Image   quality was suboptimal. The study was technically difficult, as a   result of poor sound wave transmission and body habitus. - Left ventricle: The cavity size was normal. Wall thickness was   increased in a pattern of moderate LVH. Systolic  function was   normal. The estimated ejection fraction was in the range of 55%   to 60%. Images were inadequate for LV wall motion assessment. No   gross regional variation on apical 4-chamber views. Doppler   parameters are consistent with abnormal left ventricular   relaxation (grade 1 diastolic dysfunction). Doppler parameters   are consistent with high ventricular filling pressure. - Aortic valve: Mildly to moderately calcified annulus. - Mitral valve: Mildly calcified annulus. There was mild   regurgitation. - Left atrium: The atrium was moderately dilated. - Right ventricle: Pacer wire or catheter noted in right ventricle.  Patient Profile     76 y.o. male admitted with altered mental status and respiratory failure. He hasBiVAICD and PAF, elevated troponin PAF.EP interrogated device and reported he had been in Afib since approximately 09/19/17. LV function is worse compared to previous echo (echocardiogram November 28 showed ejection fraction 25-30%, grade 1 diastolic dysfunction and mild mitral regurgitation).   Assessment & Plan     1 acute on chronic systolic congestive heart failure-volume status improving; I/O - 1720; continue present dose of lasix and follow renal function. Continue fluid restriction to 1.5 L daily and low-sodium diet as well.  2 ischemic cardiomyopathy-previous echocardiogram April 2018 showed his LV function had normalized.  It is now decreased again.  Question if this may be related to atrial fibrillation with rapid ventricular response.  Could also be ischemia mediated given non-ST elevation myocardial infarction (felt to be demand ischemia).  Continue carvedilol 12.5 mg twice daily, digoxin 0.125 mg daily and hydralazine/nitrates.  We will avoid ACE inhibitor or ARB given renal insufficiency.  3 non-ST elevation myocardial infarction-continue Plavix and beta-blocker.  He apparently is intolerant to statins.  He will need ischemia evaluation when he recovers  from this hospitalization. Would likely begin with nuclear study as he would be at high risk for contrast nephropathy given baseline renal insuff.  4 paroxysmal atrial fibrillation-patient remains in atrial fibrillation.  Rate is reasonably well controlled. Continue present dose of coreg; continue digoxin 0.125 mg daily (check  level 2 weeks).  Continue heparin until INR is therapeutic.  Continue Coumadin. We will plan cardioverting 4-6 weeks after INR therapeutic.  5 status post ICD  6 peripheral vascular disease and possible cellulitis-management per primary care.    7 chronic stage III kidney disease-follow renal function closely with diuresis.  Olga Millers, MD

## 2017-11-03 NOTE — Progress Notes (Signed)
Nutrition Consult/Follow Up  DOCUMENTATION CODES:   Obesity unspecified  INTERVENTION:    Continue Premier Protein BID, each supplement provides 160 calories and 30 gm of protein  NUTRITION DIAGNOSIS:   Inadequate oral intake related to poor appetite as evidenced by per patient/family report, improved  GOAL:   Patient will meet greater than or equal to 90% of their needs, progressing   MONITOR:   PO intake, Weight trends, I & O's, Labs, Skin  ASSESSMENT:   76 y/o male PMHx DM, CAD, PAF, CKD3, RA, Ischemic cardiomyopathy s/p ICD, HLD/HTN. Admitted for 11/12-11/30 for L foot cellulitis, c/b GIB. S/p L SFA/popliteral atherectomy 11/27. Presented from [facility]F w/ AMS and confusion. Worked up for elevated LA, abnormal UA, hypotension. Suspected Urosepsis. Intubated for airway protection in setting of metabolic encephalopathy. RD consulted for TF.   Extubated, off pressors 12/9 Septic Shock resolved 12/11 S/p endovascular revascularization LLE 12/11  Pt continues on a HH/Carbohydrate Modified diet. Refusing some meals, however, average PO intake is ~ 60%. Receiving Premier Protein supplements BID.  Weight overall stable since admission. Labs and medications reviewed. CBG's [id number].  Diet Order:  Diet heart healthy/carb modified Room service appropriate? Yes; Fluid consistency: Thin  EDUCATION NEEDS:   No education needs have been identified at this time  Skin:  Skin Assessment: Skin Integrity Issues: Skin Integrity Issues:: Diabetic Ulcer, Stage II Stage II: Sacrum Diabetic Ulcer: At L foot  Last BM:  12/16  Height:   Ht Readings from Last 1 Encounters:  10/25/17 5\' 8"  (1.727 m)   Weight:   Wt Readings from Last 1 Encounters:  11/03/17 253 lb 4.9 oz (114.9 kg)   Ideal Body Weight:  70 kg  BMI:  Body mass index is 38.52 kg/m.  Estimated Nutritional Needs:   Kcal:  2200-2400  Protein:  120-140 gm  Fluid:  2.2-2.4 L  11/05/17, RD, LDN Pager #:  [phone] After-Hours Pager #: [phone]

## 2017-11-03 NOTE — Progress Notes (Addendum)
ANTICOAGULATION CONSULT NOTE - Follow Up Consult  Pharmacy Consult for heparin Indication: atrial fibrillation  Labs: Recent Labs    11/01/17 0309  11/01/17 2112 11/02/17 0515 11/03/17 0352  HGB 9.6*  --   --  9.1* 9.4*  HCT 31.0*  --   --  29.9* 31.0*  PLT 276  --   --  245 272  LABPROT  --   --   --  16.6* 16.5*  INR  --   --   --  1.36 1.35  HEPARINUNFRC 0.33   < > <0.10* 0.35 0.16*  CREATININE 2.07*  --   --  2.19*  --    < > = values in this interval not displayed.    Assessment: 76yo male subtherapeutic on heparin after one level at goal; no gtt issues overnight per RN.  Goal of Therapy:  Heparin level 0.3-0.5 units/ml   Plan:  Will increase heparin gtt by 2-3 units/kg/hr to 1700 units/hr and check level in 8hr.  Vernard Gambles, PharmD, BCPS  11/03/2017,5:13 AM

## 2017-11-04 LAB — CBC
HCT: 30.4 % — ABNORMAL LOW (ref 39.0–52.0)
Hemoglobin: 9.5 g/dL — ABNORMAL LOW (ref 13.0–17.0)
MCH: 31.5 pg (ref 26.0–34.0)
MCHC: 31.3 g/dL (ref 30.0–36.0)
MCV: 100.7 fL — ABNORMAL HIGH (ref 78.0–100.0)
PLATELETS: 243 10*3/uL (ref 150–400)
RBC: 3.02 MIL/uL — AB (ref 4.22–5.81)
RDW: 19.5 % — AB (ref 11.5–15.5)
WBC: 6.7 10*3/uL (ref 4.0–10.5)

## 2017-11-04 LAB — GLUCOSE, CAPILLARY
GLUCOSE-CAPILLARY: 170 mg/dL — AB (ref 65–99)
GLUCOSE-CAPILLARY: 138 mg/dL — AB (ref 65–99)
Glucose-Capillary: 116 mg/dL — ABNORMAL HIGH (ref 65–99)
Glucose-Capillary: 118 mg/dL — ABNORMAL HIGH (ref 65–99)

## 2017-11-04 LAB — BASIC METABOLIC PANEL
ANION GAP: 9 (ref 5–15)
BUN: 38 mg/dL — ABNORMAL HIGH (ref 6–20)
CALCIUM: 8.8 mg/dL — AB (ref 8.9–10.3)
CO2: 25 mmol/L (ref 22–32)
Chloride: 101 mmol/L (ref 101–111)
Creatinine, Ser: 2.24 mg/dL — ABNORMAL HIGH (ref 0.61–1.24)
GFR, EST AFRICAN AMERICAN: 31 mL/min — AB (ref >60)
GFR, EST NON AFRICAN AMERICAN: 27 mL/min — AB (ref >60)
Glucose, Bld: 134 mg/dL — ABNORMAL HIGH (ref 65–99)
POTASSIUM: 3.5 mmol/L (ref 3.5–5.1)
Sodium: 135 mmol/L (ref 135–145)

## 2017-11-04 LAB — PROTIME-INR
INR: 1.43
PROTHROMBIN TIME: 17.3 s — AB (ref 11.4–15.2)

## 2017-11-04 LAB — HEPARIN LEVEL (UNFRACTIONATED)
HEPARIN UNFRACTIONATED: 0.18 [IU]/mL — AB (ref 0.30–0.70)
Heparin Unfractionated: 0.57 IU/mL (ref 0.30–0.70)
Heparin Unfractionated: 0.63 IU/mL (ref 0.30–0.70)

## 2017-11-04 MED ORDER — CARVEDILOL 6.25 MG PO TABS
18.7500 mg | ORAL_TABLET | Freq: Two times a day (BID) | ORAL | Status: DC
  Administered 2017-11-04 – 2017-11-06 (×4): 18.75 mg via ORAL
  Filled 2017-11-04 (×4): qty 1

## 2017-11-04 MED ORDER — WARFARIN SODIUM 5 MG PO TABS
5.0000 mg | ORAL_TABLET | Freq: Once | ORAL | Status: AC
  Administered 2017-11-04: 5 mg via ORAL
  Filled 2017-11-04: qty 1

## 2017-11-04 MED ORDER — TRAZODONE HCL 50 MG PO TABS
50.0000 mg | ORAL_TABLET | Freq: Once | ORAL | Status: AC
  Administered 2017-11-04: 50 mg via ORAL
  Filled 2017-11-04: qty 1

## 2017-11-04 MED ORDER — POTASSIUM CHLORIDE CRYS ER 20 MEQ PO TBCR
20.0000 meq | EXTENDED_RELEASE_TABLET | Freq: Two times a day (BID) | ORAL | Status: DC
  Administered 2017-11-04 – 2017-11-05 (×3): 20 meq via ORAL
  Filled 2017-11-04 (×3): qty 1

## 2017-11-04 MED ORDER — HEPARIN (PORCINE) IN NACL 100-0.45 UNIT/ML-% IJ SOLN
1700.0000 [IU]/h | INTRAMUSCULAR | Status: DC
  Administered 2017-11-04 (×2): 1850 [IU]/h via INTRAVENOUS
  Filled 2017-11-04: qty 250

## 2017-11-04 NOTE — Progress Notes (Signed)
sarna lotion caused burning on his legs. Do not use please.

## 2017-11-04 NOTE — Progress Notes (Signed)
ANTICOAGULATION CONSULT NOTE - Follow Up Consult  Pharmacy Consult for Heparin and Warfarin Indication: atrial fibrillation  Allergies  Allergen Reactions  . Allegra [Fexofenadine] Itching  . Crestor [Rosuvastatin Calcium] Other (See Comments)    Body aches  . Codeine Other (See Comments)    blisters, but able to take hydrocodone    Patient Measurements: Height: 5\' 8"  (172.7 cm) Weight: 253 lb 4.9 oz (114.9 kg) IBW/kg (Calculated) : 68.4 Heparin Dosing Weight: 94 kg  Vital Signs: Temp: 98.2 F (36.8 C) (12/19 2102) Temp Source: Oral (12/19 2102) BP: 120/80 (12/19 2102) Pulse Rate: 86 (12/19 2102)  Labs: Recent Labs    11/02/17 0515 11/03/17 0352 11/03/17 0550 11/03/17 1219 11/04/17 0325  HGB 9.1* 9.4*  --   --  9.5*  HCT 29.9* 31.0*  --   --  30.4*  PLT 245 272  --   --  243  LABPROT 16.6* 16.5*  --   --  17.3*  INR 1.36 1.35  --   --  1.43  HEPARINUNFRC 0.35 0.16*  --  0.16* 0.18*  CREATININE 2.19*  --  2.23*  --  2.24*    Estimated Creatinine Clearance: 34 mL/min (A) (by C-G formula based on SCr of 2.24 mg/dL (H)).   Medications:  Scheduled:  . carvedilol  12.5 mg Oral BID WC  . clopidogrel  75 mg Oral Daily  . collagenase   Topical Daily  . digoxin  0.125 mg Oral Daily  . furosemide  40 mg Intravenous BID  . hydrALAZINE  10 mg Oral Q8H  . insulin aspart  0-15 Units Subcutaneous TID WC  . insulin aspart  0-5 Units Subcutaneous QHS  . insulin glargine  5 Units Subcutaneous BID  . isosorbide dinitrate  10 mg Oral TID  . levothyroxine  50 mcg Oral QAC breakfast  . pantoprazole  40 mg Oral QHS  . protein supplement shake  11 oz Oral BID BM  . tamsulosin  0.4 mg Oral Daily  . Warfarin - Pharmacist Dosing Inpatient   Does not apply q1800   Infusions:  . sodium chloride Stopped (10/26/17 1346)  . heparin 1,750 Units/hr (11/03/17 2030)    Assessment: 76 yo M on Warfarin PTA for hx afib.  Warfarin was initially held in the setting of GIB (last dose  11/12).  GIB now resolved.  Pt was started on heparin bridging back to warfarin.  Heparin level remains low this am despite rate increase, no issues per RN.   Goal of Therapy:  INR 2-3 Heparin level 0.3-0.5 units/ml (lower goal due to recent GIB) Monitor platelets by anticoagulation protocol: Yes   Plan:  Inc heparin to 1900 units/hr 1300 HL  13/12, PharmD, BCPS Clinical Pharmacist Phone: [phone]

## 2017-11-04 NOTE — Progress Notes (Signed)
Progress Note  Patient Name: [name] Date of Encounter: 11/04/2017  Primary Cardiologist: Dr. Diona Browner, Dr. Johney Frame (EP)   Subjective   No CP or dyspnea  Inpatient Medications    Scheduled Meds: . carvedilol  12.5 mg Oral BID WC  . clopidogrel  75 mg Oral Daily  . collagenase   Topical Daily  . digoxin  0.125 mg Oral Daily  . furosemide  40 mg Intravenous BID  . hydrALAZINE  10 mg Oral Q8H  . insulin aspart  0-15 Units Subcutaneous TID WC  . insulin aspart  0-5 Units Subcutaneous QHS  . insulin glargine  5 Units Subcutaneous BID  . isosorbide dinitrate  10 mg Oral TID  . levothyroxine  50 mcg Oral QAC breakfast  . pantoprazole  40 mg Oral QHS  . potassium chloride  20 mEq Oral BID  . protein supplement shake  11 oz Oral BID BM  . tamsulosin  0.4 mg Oral Daily  . Warfarin - Pharmacist Dosing Inpatient   Does not apply q1800   Continuous Infusions: . sodium chloride Stopped (10/26/17 1346)  . heparin 1,900 Units/hr (11/04/17 0700)   PRN Meds: sodium chloride, acetaminophen (TYLENOL) oral liquid 160 mg/5 mL, camphor-menthol, sodium chloride flush   Vital Signs    Vitals:   11/03/17 2102 11/04/17 0458 11/04/17 0500 11/04/17 0823  BP: 120/80 124/75  133/61  Pulse: 86 83  93  Resp: 18 18    Temp: 98.2 F (36.8 C) (!) 97.5 F (36.4 C)    TempSrc: Oral Oral    SpO2: 100% 96%    Weight:   249 lb 9 oz (113.2 kg)   Height:        Intake/Output Summary (Last 24 hours) at 11/04/2017 1232 Last data filed at 11/04/2017 0700 Gross per 24 hour  Intake 0 ml  Output 625 ml  Net -625 ml   Filed Weights   11/02/17 0504 11/03/17 0458 11/04/17 0500  Weight: 266 lb 15.6 oz (121.1 kg) 253 lb 4.9 oz (114.9 kg) 249 lb 9 oz (113.2 kg)     Physical Exam   General: WD/obese NAD Head: normal Neck: supple Lungs:  CTA Heart: irregular Abdomen: NT, ND, no masses Extremities: 1+ edema Neuro: grossly intact  Labs    Chemistry Recent Labs  Lab 11/02/17 0515  11/03/17 0550 11/04/17 0325  NA 135 135 135  K 3.7 3.8 3.5  CL 103 100* 101  CO2 22 22 25   GLUCOSE 88 95 134*  BUN 32* 39* 38*  CREATININE 2.19* 2.23* 2.24*  CALCIUM 8.7* 8.8* 8.8*  GFRNONAA 27* 27* 27*  GFRAA 32* 31* 31*  ANIONGAP 10 13 9      Hematology Recent Labs  Lab 11/02/17 0515 11/03/17 0352 11/04/17 0325  WBC 6.9 7.6 6.7  RBC 2.99* 3.12* 3.02*  HGB 9.1* 9.4* 9.5*  HCT 29.9* 31.0* 30.4*  MCV 100.0 99.4 100.7*  MCH 30.4 30.1 31.5  MCHC 30.4 30.3 31.3  RDW 19.5* 19.4* 19.5*  PLT 245 272 243    Telemetry    Afib with v pacing - Personally Reviewed    Cardiac Studies   Echo 10/13/17: Study Conclusions - Left ventricle: Diffuse hypokinesis worse in inferior wall and   abnormal septal motion The cavity size was moderately dilated.   Wall thickness was increased in a pattern of severe LVH. Systolic   function was severely reduced. The estimated ejection fraction   was in the range of 25% to 30%. Doppler  parameters are consistent   with abnormal left ventricular relaxation (grade 1 diastolic   dysfunction). - Mitral valve: Calcified annulus. Mildly thickened leaflets .   There was mild regurgitation. - Atrial septum: No defect or patent foramen ovale was identified.   Echo 03/03/17: Study Conclusions - Procedure narrative: Transthoracic echocardiography. Image   quality was suboptimal. The study was technically difficult, as a   result of poor sound wave transmission and body habitus. - Left ventricle: The cavity size was normal. Wall thickness was   increased in a pattern of moderate LVH. Systolic function was   normal. The estimated ejection fraction was in the range of 55%   to 60%. Images were inadequate for LV wall motion assessment. No   gross regional variation on apical 4-chamber views. Doppler   parameters are consistent with abnormal left ventricular   relaxation (grade 1 diastolic dysfunction). Doppler parameters   are consistent with high  ventricular filling pressure. - Aortic valve: Mildly to moderately calcified annulus. - Mitral valve: Mildly calcified annulus. There was mild   regurgitation. - Left atrium: The atrium was moderately dilated. - Right ventricle: Pacer wire or catheter noted in right ventricle.  Patient Profile     76 y.o. male admitted with altered mental status and respiratory failure. He hasBiVAICD and PAF, elevated troponin PAF.EP interrogated device and reported he had been in Afib since approximately 09/19/17. LV function is worse compared to previous echo (echocardiogram November 28 showed ejection fraction 25-30%, grade 1 diastolic dysfunction and mild mitral regurgitation).   Assessment & Plan     1 acute on chronic systolic congestive heart failure-volume status improving; I/O - 1403; continue present dose of lasix and follow renal function. Pt instructed on fluid restriction and low NA diet.   2 ischemic cardiomyopathy-previous echocardiogram April 2018 showed his LV function had normalized.  It is now decreased again.  Question if this may be related to atrial fibrillation with rapid ventricular response.  Could also be ischemia mediated given non-ST elevation myocardial infarction (felt to be demand ischemia).  Increase carvedilol to 18.75 mg twice daily; continue digoxin 0.125 mg daily and hydralazine/nitrates.  We will avoid ACE inhibitor or ARB given renal insufficiency.  3 non-ST elevation myocardial infarction-continue Plavix and beta-blocker.  He apparently is intolerant to statins.  He will need ischemia evaluation when he recovers from this hospitalization. Would likely begin with nuclear study as he would be at high risk for contrast nephropathy given baseline renal insuff. Will schedule nuclear study following DC at [facility].  4 persistant atrial fibrillation-patient remains in atrial fibrillation.  Rate is reasonably well controlled. Continue coreg but increase to 18.75 mg BID; continue  digoxin 0.125 mg daily (check level 2 weeks).  Continue heparin until INR is therapeutic.  Continue Coumadin. We will plan cardioverting 4-6 weeks after INR therapeutic.  5 status post ICD  6 peripheral vascular disease and possible cellulitis-management per primary care.    7 chronic stage III kidney disease-follow renal function closely with diuresis.  Possible DC tomorrow if stable.  Olga Millers, MD

## 2017-11-04 NOTE — Progress Notes (Signed)
ANTICOAGULATION CONSULT NOTE - Follow Up Consult  Pharmacy Consult for Heparin and Warfarin Indication: atrial fibrillation  Allergies  Allergen Reactions  . Allegra [Fexofenadine] Itching  . Crestor [Rosuvastatin Calcium] Other (See Comments)    Body aches  . Codeine Other (See Comments)    blisters, but able to take hydrocodone    Patient Measurements: Height: 5\' 8"  (172.7 cm) Weight: 249 lb 9 oz (113.2 kg) IBW/kg (Calculated) : 68.4 Heparin Dosing Weight: 94 kg  Vital Signs: Temp: 98 F (36.7 C) (12/20 1400) Temp Source: Oral (12/20 1400) BP: 95/53 (12/20 1400) Pulse Rate: 97 (12/20 1400)  Labs: Recent Labs    11/02/17 0515 11/03/17 0352 11/03/17 0550 11/03/17 1219 11/04/17 0325 11/04/17 1417  HGB 9.1* 9.4*  --   --  9.5*  --   HCT 29.9* 31.0*  --   --  30.4*  --   PLT 245 272  --   --  243  --   LABPROT 16.6* 16.5*  --   --  17.3*  --   INR 1.36 1.35  --   --  1.43  --   HEPARINUNFRC 0.35 0.16*  --  0.16* 0.18* 0.57  CREATININE 2.19*  --  2.23*  --  2.24*  --     Estimated Creatinine Clearance: 33.7 mL/min (A) (by C-G formula based on SCr of 2.24 mg/dL (H)).   Medications:  Scheduled:  . carvedilol  18.75 mg Oral BID WC  . clopidogrel  75 mg Oral Daily  . collagenase   Topical Daily  . digoxin  0.125 mg Oral Daily  . furosemide  40 mg Intravenous BID  . hydrALAZINE  10 mg Oral Q8H  . insulin aspart  0-15 Units Subcutaneous TID WC  . insulin aspart  0-5 Units Subcutaneous QHS  . insulin glargine  5 Units Subcutaneous BID  . isosorbide dinitrate  10 mg Oral TID  . levothyroxine  50 mcg Oral QAC breakfast  . pantoprazole  40 mg Oral QHS  . potassium chloride  20 mEq Oral BID  . protein supplement shake  11 oz Oral BID BM  . tamsulosin  0.4 mg Oral Daily  . Warfarin - Pharmacist Dosing Inpatient   Does not apply q1800   Infusions:  . sodium chloride Stopped (10/26/17 1346)  . heparin 1,900 Units/hr (11/04/17 1517)    Assessment: 76 yo M on  Warfarin PTA for hx afib.  Warfarin was initially held in the setting of GIB (last dose 11/12).  GIB now resolved.  Pt was started on heparin bridging back to warfarin.  Heparin level 0.57 on 1900 units/hr.  Level therapeutic but just above our target to keep HL 0.3-0.5 due to recent GIB.  Resumed Warfarin 12/17 : INR 1.36 >1.35>1.43 today after warfarin 4mg ,4mg  and 5mg    (PTA dose = 5mg  daily except 2.5mg  MWF) No bleeding noted .  Hb 9.5 stable, pltc wnl Cautious dosing due to recent GIB  (previously INR increased supratherapeutic after 7.5mg  and 5mg  doses) thus will stay with 5mg  today. Consider increasing dose if INR doesn't increase tomorrow.    Goal of Therapy:  INR 2-3 Heparin level 0.3-0.5 units/ml (lower goal due to recent GIB) Monitor platelets by anticoagulation protocol: Yes   Plan:  Decrease heparin to 1850 units/hr 6 hour heparin level to be drawn by IV nurse team due to very difficult stick. Warfarin 5mg  po tonight x1 Daily heparin level, INR, and CBC   Thank you for allowing pharmacy to be  part of this patients care team. [name], RPh Clinical Pharmacist [phone]-4P [phone] 4P-10P [phone] Main Pharmacy [phone] 11/04/2017 4:15 PM ]

## 2017-11-04 NOTE — Progress Notes (Signed)
PROGRESS NOTE    [name]  SUP:[id number] DOB: [birth year] DOA: 10/23/2017 PCP: [name], MD      Brief Narrative:  76 yo M with CAD, DM, pAF, RA, CHF EF 25% presented with DFI.  Basically, he was admitted 11/12 to 11/30 with sepsis from DFI.  That hospitalization was actually complicated by massive GI bleed, hemorrhagic shock requiring intubation/transfusions.  He had two EGDs, nothing could be clipped, but the bleeding stopped and he stabilized.  By the end of the hospitalization, he had an arteriogram and angioplasty by Vascular surgery on 11/27.    He went to SNF, but was only there a week before he was readmitted with septic shock, delirium on 12/8.  He was intubated again, started on broad spectrum antibiotics.  The source has never been entirely clear, not sure if it was cellulitis/DFI (this was doubted) vs UTI?  -Regardless, he was intubated 12/8-12/10 -Ultrasound scrotum 12/13, just edema, hydrocele -3 phase bone scan 12/14, possible osteomyelitis -CT foot, 12/15 doubtful osteomyelitis -Vascular evaluated the patient, felt his foot was doing well, could be followed as an oupatient. -Urology evaluated the patient for recurrent urinary retention, recommended foley until able to stand to urinate, Flomax, follow up in their office in 3-4 weeks (early Jan) -ID evaluated the patient, felt there was no clear source of infection, recommended observing off antibiotics -Stable off antibiotics from 12/16 til now          Assessment & Plan:  Active Problems:   Diabetes mellitus (HCC)   Dyslipidemia   CAD, NATIVE VESSEL   SYSTOLIC HEART FAILURE, CHRONIC   Biventricular ICD (implantable cardioverter-defibrillator) in place   Cardiomyopathy, ischemic   Paroxysmal atrial fibrillation (HCC)   CKD (chronic kidney disease) stage 3, GFR 30-59 ml/min (HCC)   PVD (peripheral vascular disease) with claudication (HCC)   Rheumatoid arthritis involving multiple sites with positive  rheumatoid factor (HCC)   Plantar ulcer of left foot (HCC)   Septic shock 2/2 to LLE cellulitis/wound Completed IV vancomycin and PO Cipro.  S/p revascularization of LLE. Imaging inconclusive of osteomyelitis.  Plan to monitor off antibiotics.    -Consult VVS and podiatry, appreciate cares -Consult WOC   Ischemic CM with EF 25-30% (10/13/17) -Continue Lasix  -Continue Plavix, Isordil, digoxin -Digoxin level in 3 weeks -Increase BB -Consult to Cardiology, appreciate cares  Paroxysmal a-fib -Continue warfarin with heparin bridge  Type 2 NSTEMI Outpatient workup   AKI on CKD 3 Baseline 1.7, peak 2.4    Mild hyponatremia, resolved Resolved  Hypothyroidism -Continue levothyroxine  Enlarged scrotum r/o hydrocele vs hernia -scrotum U/S 10/28/17:1. Diffuse scrotal wall thickening. 2. Small right hydrocele. Small left varicocele. No evidence of testicular mass or torsion . -patient denies scrotal pain at the time of this examination  Suspected urinary retention -Continue Flomax     DVT prophylaxis: N/A Code Status: FULL Disposition Plan: Continue diuresis.  Likely home in 2-3 days, will need to complete heparin bridge, cannot have Lovenox with renal funciton.   Consultants:   Cardiology, VVS, ID, Orthoo        Subjective: No change.  No new chest pain, fever, foot pain, drainage, redness or swelling.  No new dyspnea, abdominal pain.  Objective: Vitals:   11/04/17 0823 11/04/17 1246 11/04/17 1247 11/04/17 1400  BP: 133/61 120/65 125/65 (!) 95/53  Pulse: 93 99  97  Resp:  18  18  Temp:  98.6 F (37 C)  98 F (36.7 C)  TempSrc:  Oral  Oral  SpO2:  96%  96%  Weight:      Height:        Intake/Output Summary (Last 24 hours) at 11/04/2017 1604 Last data filed at 11/04/2017 0700 Gross per 24 hour  Intake 0 ml  Output 625 ml  Net -625 ml   Filed Weights   11/02/17 0504 11/03/17 0458 11/04/17 0500  Weight: 121.1 kg (266 lb 15.6 oz) 114.9 kg  (253 lb 4.9 oz) 113.2 kg (249 lb 9 oz)    Examination: General appearance: Elderly adult male, alert and in no acute distress. Tired, pretty listless.  HEENT: Anicteric, conjunctiva pink, lids and lashes normal. No nasal deformity, discharge, epistaxis.  Lips moist.   Skin: Warm and dry.  no jaundice.  No suspicious rashes or lesions. Cardiac: RRR, nl S1-S2, no murmurs appreciated.  Capillary refill is brisk.  JVP up still.  2+ LE edema.  Radia  pulses 2+ and symmetric. Respiratory: Normal respiratory rate and rhythm.  CTAB without rales or wheezes. Abdomen: Abdomen soft.  no TTP. No ascites, distension, hepatosplenomegaly.   MSK: No deformities or effusions. Neuro: Awake and alert.  EOMI, moves all extremities. Speech fluent.    Psych: Sensorium intact and responding to questions, attention normal. Affect normal.  Judgment and insight appear normal.    Data Reviewed: I have personally reviewed following labs and imaging studies:  CBC: Recent Labs  Lab 11/01/17 0309 11/02/17 0515 11/03/17 0352 11/04/17 0325  WBC 7.6 6.9 7.6 6.7  HGB 9.6* 9.1* 9.4* 9.5*  HCT 31.0* 29.9* 31.0* 30.4*  MCV 102.0* 100.0 99.4 100.7*  PLT 276 245 272 243   Basic Metabolic Panel: Recent Labs  Lab 10/29/17 0208 11/01/17 0309 11/02/17 0515 11/03/17 0550 11/04/17 0325  NA 135 135 135 135 135  K 3.8 4.0 3.7 3.8 3.5  CL 105 105 103 100* 101  CO2 21* 18* 22 22 25   GLUCOSE 148* 100* 88 95 134*  BUN 40* 33* 32* 39* 38*  CREATININE 2.23* 2.07* 2.19* 2.23* 2.24*  CALCIUM 8.7* 8.7* 8.7* 8.8* 8.8*   GFR: Estimated Creatinine Clearance: 33.7 mL/min (A) (by C-G formula based on SCr of 2.24 mg/dL (H)). Liver Function Tests: No results for input(s): AST, ALT, ALKPHOS, BILITOT, PROT, ALBUMIN in the last 168 hours. No results for input(s): LIPASE, AMYLASE in the last 168 hours. No results for input(s): AMMONIA in the last 168 hours. Coagulation Profile: Recent Labs  Lab 11/02/17 0515 11/03/17 0352  11/04/17 0325  INR 1.36 1.35 1.43   Cardiac Enzymes: No results for input(s): CKTOTAL, CKMB, CKMBINDEX, TROPONINI in the last 168 hours. BNP (last 3 results) No results for input(s): PROBNP in the last 8760 hours. HbA1C: No results for input(s): HGBA1C in the last 72 hours. CBG: Recent Labs  Lab 11/03/17 1612 11/03/17 1808 11/03/17 2049 11/04/17 0731 11/04/17 1206  GLUCAP 139* 182* 153* 116* 118*   Lipid Profile: No results for input(s): CHOL, HDL, LDLCALC, TRIG, CHOLHDL, LDLDIRECT in the last 72 hours. Thyroid Function Tests: No results for input(s): TSH, T4TOTAL, FREET4, T3FREE, THYROIDAB in the last 72 hours. Anemia Panel: No results for input(s): VITAMINB12, FOLATE, FERRITIN, TIBC, IRON, RETICCTPCT in the last 72 hours. Urine analysis:    Component Value Date/Time   COLORURINE AMBER (A) 10/12/2017 0450   APPEARANCEUR CLOUDY (A) 10/12/2017 0450   LABSPEC 1.024 10/12/2017 0450   PHURINE 5.0 10/12/2017 0450   GLUCOSEU NEGATIVE 10/12/2017 0450   HGBUR LARGE (A) 10/12/2017 0450   BILIRUBINUR NEGATIVE 10/12/2017  0450   KETONESUR 5 (A) 10/12/2017 0450   PROTEINUR 100 (A) 10/12/2017 0450   UROBILINOGEN 0.2 07/05/2014 2117   NITRITE NEGATIVE 10/12/2017 0450   LEUKOCYTESUR TRACE (A) 10/12/2017 0450   Sepsis Labs: @LABRCNTIP (procalcitonin:4,lacticidven:4)  )No results found for this or any previous visit (from the past 240 hour(s)).       Radiology Studies: No results found.      Scheduled Meds: . carvedilol  18.75 mg Oral BID WC  . clopidogrel  75 mg Oral Daily  . collagenase   Topical Daily  . digoxin  0.125 mg Oral Daily  . furosemide  40 mg Intravenous BID  . hydrALAZINE  10 mg Oral Q8H  . insulin aspart  0-15 Units Subcutaneous TID WC  . insulin aspart  0-5 Units Subcutaneous QHS  . insulin glargine  5 Units Subcutaneous BID  . isosorbide dinitrate  10 mg Oral TID  . levothyroxine  50 mcg Oral QAC breakfast  . pantoprazole  40 mg Oral QHS  .  potassium chloride  20 mEq Oral BID  . protein supplement shake  11 oz Oral BID BM  . tamsulosin  0.4 mg Oral Daily  . warfarin  5 mg Oral ONCE-1800  . Warfarin - Pharmacist Dosing Inpatient   Does not apply q1800   Continuous Infusions: . sodium chloride Stopped (10/26/17 1346)  . heparin       LOS: 12 days    Time spent: 25 minutes    14/11/18, MD [facility] Pager [phone]  If 7PM-7AM, please contact night-coverage [email or web address] Password [email or web address] 11/04/2017, 4:04 PM

## 2017-11-05 ENCOUNTER — Encounter (HOSPITAL_COMMUNITY): Payer: PPO

## 2017-11-05 LAB — BASIC METABOLIC PANEL
Anion gap: 9 (ref 5–15)
BUN: 38 mg/dL — ABNORMAL HIGH (ref 6–20)
CHLORIDE: 101 mmol/L (ref 101–111)
CO2: 27 mmol/L (ref 22–32)
Calcium: 8.7 mg/dL — ABNORMAL LOW (ref 8.9–10.3)
Creatinine, Ser: 2.41 mg/dL — ABNORMAL HIGH (ref 0.61–1.24)
GFR calc non Af Amer: 24 mL/min — ABNORMAL LOW (ref >60)
GFR, EST AFRICAN AMERICAN: 28 mL/min — AB (ref >60)
Glucose, Bld: 129 mg/dL — ABNORMAL HIGH (ref 65–99)
POTASSIUM: 4.3 mmol/L (ref 3.5–5.1)
SODIUM: 137 mmol/L (ref 135–145)

## 2017-11-05 LAB — CBC
HEMATOCRIT: 28.7 % — AB (ref 39.0–52.0)
Hemoglobin: 8.9 g/dL — ABNORMAL LOW (ref 13.0–17.0)
MCH: 30.8 pg (ref 26.0–34.0)
MCHC: 31 g/dL (ref 30.0–36.0)
MCV: 99.3 fL (ref 78.0–100.0)
PLATELETS: 224 10*3/uL (ref 150–400)
RBC: 2.89 MIL/uL — AB (ref 4.22–5.81)
RDW: 19.2 % — ABNORMAL HIGH (ref 11.5–15.5)
WBC: 6.2 10*3/uL (ref 4.0–10.5)

## 2017-11-05 LAB — GLUCOSE, CAPILLARY
GLUCOSE-CAPILLARY: 125 mg/dL — AB (ref 65–99)
GLUCOSE-CAPILLARY: 154 mg/dL — AB (ref 65–99)
Glucose-Capillary: 134 mg/dL — ABNORMAL HIGH (ref 65–99)
Glucose-Capillary: 141 mg/dL — ABNORMAL HIGH (ref 65–99)

## 2017-11-05 LAB — HEPARIN LEVEL (UNFRACTIONATED)
HEPARIN UNFRACTIONATED: 0.56 [IU]/mL (ref 0.30–0.70)
Heparin Unfractionated: 0.48 IU/mL (ref 0.30–0.70)

## 2017-11-05 LAB — PROTIME-INR
INR: 1.57
Prothrombin Time: 18.7 seconds — ABNORMAL HIGH (ref 11.4–15.2)

## 2017-11-05 MED ORDER — HEPARIN (PORCINE) IN NACL 100-0.45 UNIT/ML-% IJ SOLN
1600.0000 [IU]/h | INTRAMUSCULAR | Status: DC
  Administered 2017-11-05: 1600 [IU]/h via INTRAVENOUS
  Filled 2017-11-05: qty 250

## 2017-11-05 MED ORDER — FUROSEMIDE 40 MG PO TABS
40.0000 mg | ORAL_TABLET | Freq: Every day | ORAL | Status: DC
  Administered 2017-11-05: 40 mg via ORAL
  Filled 2017-11-05 (×2): qty 1

## 2017-11-05 MED ORDER — POTASSIUM CHLORIDE CRYS ER 20 MEQ PO TBCR
20.0000 meq | EXTENDED_RELEASE_TABLET | Freq: Every day | ORAL | Status: DC
  Administered 2017-11-06: 20 meq via ORAL
  Filled 2017-11-05: qty 1

## 2017-11-05 MED ORDER — FUROSEMIDE 40 MG PO TABS: 40.0000 mg | ORAL_TABLET | Freq: Two times a day (BID) | ORAL | Status: DC

## 2017-11-05 MED ORDER — WARFARIN SODIUM 6 MG PO TABS
6.0000 mg | ORAL_TABLET | Freq: Once | ORAL | Status: AC
  Administered 2017-11-05: 6 mg via ORAL
  Filled 2017-11-05: qty 1

## 2017-11-05 NOTE — Progress Notes (Signed)
Physical Therapy Treatment Patient Details Name: [name] MRN: [id number] DOB: [birth year] Today's Date: 11/05/2017    History of Present Illness 76 diabetic, CAD, CKD stage III with ischemic cardiomyopathy status post AICD admitted 12/7 confusion, cellulitis left lower extremity and lactate of 1.8, required mechanical ventilation briefly until he was extubated 12/9, pressors weaned to off. Last admission complicated by GI bleed. Has been at rehab since that time but does not want to go back there.     PT Comments    Pt with slow progression towards goals, and continues to present with inability to maintain PWB on the heel on LLE. Very resistive to cues for appropriate WB despite max cuing throughout gait. Limited gait distance secondary to inability to maintain weight bearing precautions. Continues to required mod A for functional mobility with RW. Discussed going to SNF at d/c, however, pt continues to report he wants to go home. Feel safest option is for pt to go to SNF at d/c, however, if he refuses, will need max HH services. Also feel safest option for transport home will be by ambulance, however, pt refusing that as well stating wife and other family can assist. Will continue to follow acutely to maximize functional mobility independence and safety.   Follow Up Recommendations  SNF;Supervision/Assistance - 24 hour(will likely refuse )     Equipment Recommendations  None recommended by PT    Recommendations for Other Services       Precautions / Restrictions Precautions Precautions: Fall Restrictions Weight Bearing Restrictions: Yes LLE Weight Bearing: Partial weight bearing LLE Partial Weight Bearing Percentage or Pounds: heel only     Mobility  Bed Mobility Overal bed mobility: Needs Assistance Bed Mobility: Supine to Sit     Supine to sit: Min assist     General bed mobility comments: Min A for trunk elevation. Use of bed rails and elevated HOB.    Transfers Overall transfer level: Needs assistance Equipment used: Rolling walker (2 wheeled) Transfers: Sit to/from Stand Sit to Stand: Mod assist;From elevated surface         General transfer comment: Mod A for power up to standing. Max verbal cues to kick LLE out to maintain precautions, however, pt not following commands.   Ambulation/Gait Ambulation/Gait assistance: Mod assist;+2 safety/equipment Ambulation Distance (Feet): 15 Feet Assistive device: Rolling walker (2 wheeled) Gait Pattern/deviations: Shuffle;Decreased weight shift to left;Antalgic;Step-to pattern Gait velocity: Decreased Gait velocity interpretation: Below normal speed for age/gender General Gait Details: Mod A for steadying throughout. +2 to help with line management. Max cues for appropriate weight bearing and technique on LLE, however, pt very resistant to cues. Verbal cues also for proximity to RW and safe use of RW during ambulation.    Stairs            Wheelchair Mobility    Modified Rankin (Stroke Patients Only)       Balance Overall balance assessment: Needs assistance Sitting-balance support: Feet supported Sitting balance-Leahy Scale: Good     Standing balance support: Bilateral upper extremity supported Standing balance-Leahy Scale: Poor Standing balance comment: Reliant on BUE and minA for static standing; unable to maintain heel only PWB on LLE                            Cognition Arousal/Alertness: Awake/alert Behavior During Therapy: WFL for tasks assessed/performed Overall Cognitive Status: Within Functional Limits for tasks assessed Area of Impairment: Safety/judgement;Following commands  Following Commands: Follows one step commands with increased time;Follows multi-step commands inconsistently Safety/Judgement: Decreased awareness of deficits;Decreased awareness of safety     General Comments: Pt very unaware of current  deficits and unable to maintain precautions despite max verbal cues.       Exercises General Exercises - Lower Extremity Ankle Circles/Pumps: AROM;Both;20 reps Quad Sets: AROM;Both;10 reps Hip ABduction/ADduction: AROM;Both;10 reps    General Comments General comments (skin integrity, edema, etc.): Pt currently refusing SNF despite education. Educated about stair management and options for transport home, however, pt reports wife and children will help. Very resistive to education.       Pertinent Vitals/Pain Pain Assessment: Faces Faces Pain Scale: Hurts even more Pain Location: L foot and R knee and foot due to OA Pain Descriptors / Indicators: Grimacing;Guarding Pain Intervention(s): Limited activity within patient's tolerance;Monitored during session;Repositioned    Home Living                      Prior Function            PT Goals (current goals can now be found in the care plan section) Acute Rehab PT Goals Patient Stated Goal: to go home  PT Goal Formulation: With patient Time For Goal Achievement: 11/09/17 Potential to Achieve Goals: Fair Progress towards PT goals: Progressing toward goals    Frequency    Min 2X/week      PT Plan Current plan remains appropriate    Co-evaluation              AM-PAC PT "6 Clicks" Daily Activity  Outcome Measure  Difficulty turning over in bed (including adjusting bedclothes, sheets and blankets)?: A Lot Difficulty moving from lying on back to sitting on the side of the bed? : Unable Difficulty sitting down on and standing up from a chair with arms (e.g., wheelchair, bedside commode, etc,.)?: Unable Help needed moving to and from a bed to chair (including a wheelchair)?: A Lot Help needed walking in hospital room?: A Lot Help needed climbing 3-5 steps with a railing? : Total 6 Click Score: 9    End of Session Equipment Utilized During Treatment: Gait belt Activity Tolerance: Patient tolerated treatment  well Patient left: with call bell/phone within reach;in chair;with chair alarm set Nurse Communication: Mobility status PT Visit Diagnosis: Muscle weakness (generalized) (M62.81);Difficulty in walking, not elsewhere classified (R26.2);Pain Pain - Right/Left: Left Pain - part of body: Ankle and joints of foot     Time: 1341-1401 PT Time Calculation (min) (ACUTE ONLY): 20 min  Charges:  $Gait Training: 8-22 mins                    G Codes:       Gladys Damme, PT, DPT  Acute Rehabilitation Services  Pager: [phone]    Lehman Prom 11/05/2017, 3:28 PM

## 2017-11-05 NOTE — Progress Notes (Signed)
CSW spoke with patient again to confirm that he is refusing SNF and discharging home. Patient does not want SNF and wants to go home today if possible. RNCM aware of home health needs. Patient states he does not need an ambulance transport home and that his wife will help him into the car.  CSW signing off.  Osborne Casco Verdun Rackley LCSWA [phone]

## 2017-11-05 NOTE — Progress Notes (Signed)
PROGRESS NOTE    [name]  OBS:[id number] DOB: 1941/02/15 DOA: 10/23/2017 PCP: [name], MD      Brief Narrative:  76 yo M with CAD, DM, pAF, RA, CHF EF 25% presented with DFI.  Basically, he was admitted 11/12 to 11/30 with sepsis from DFI.  That hospitalization was actually complicated by massive GI bleed, hemorrhagic shock requiring intubation/transfusions.  He had two EGDs, nothing could be clipped, but the bleeding stopped and he stabilized.  By the end of the hospitalization, he had an arteriogram and angioplasty by Vascular surgery on 11/27.    He went to SNF, but was only there a week before he was readmitted with septic shock, delirium on 12/8.  He was intubated again, started on broad spectrum antibiotics.  The source has never been entirely clear, not sure if it was cellulitis/DFI (this was doubted) vs UTI?  -Regardless, he was intubated 12/8-12/10 -Ultrasound scrotum 12/13, just edema, hydrocele -3 phase bone scan 12/14, possible osteomyelitis -CT foot, 12/15 doubtful osteomyelitis -Vascular evaluated the patient, felt his foot was doing well, could be followed as an oupatient. -Urology evaluated the patient for recurrent urinary retention, recommended foley until able to stand to urinate, Flomax, follow up in their office in 3-4 weeks (early Jan) -ID evaluated the patient, felt there was no clear source of infection, recommended observing off antibiotics -Stable off antibiotics from 12/16 til now          Assessment & Plan:  Active Problems:   Diabetes mellitus (HCC)   Dyslipidemia   CAD, NATIVE VESSEL   SYSTOLIC HEART FAILURE, CHRONIC   Biventricular ICD (implantable cardioverter-defibrillator) in place   Cardiomyopathy, ischemic   Paroxysmal atrial fibrillation (HCC)   CKD (chronic kidney disease) stage 3, GFR 30-59 ml/min (HCC)   PVD (peripheral vascular disease) with claudication (HCC)   Rheumatoid arthritis involving multiple sites with positive  rheumatoid factor (HCC)   Plantar ulcer of left foot (HCC)   Septic shock 2/2 to LLE cellulitis/wound Completed IV vancomycin and PO Cipro.  S/p revascularization of LLE. Imaging inconclusive of osteomyelitis.  Plan to monitor off antibiotics.    -Consult VVS and podiatry, appreciate cares -Consult WOC   Ischemic CM with EF 25-30% (10/13/17) -Lasix to oral today -Continue Plavix, Isordil, digoxin -Digoxin level in 3 weeks -Continue higher dose BB -Consult to Cardiology, appreciate cares  Paroxysmal a-fib -Continue warfarin with heparin bridge, INR not yet therapeutic  Type 2 NSTEMI Outpatient workup   AKI on CKD 3 Baseline 1.7, peak 2.4    Mild hyponatremia, resolved Resolved  Hypothyroidism -Continue levothyroxine  Enlarged scrotum r/o hydrocele vs hernia -scrotum U/S 10/28/17:1. Diffuse scrotal wall thickening. 2. Small right hydrocele. Small left varicocele. No evidence of testicular mass or torsion . -patient denies scrotal pain at the time of this examination  Suspected urinary retention -Continue Flomax     DVT prophylaxis: N/A Code Status: FULL Disposition Plan: Diruesis done, just waiting for heparin bridge.  Consultants:   Cardiology, VVS, ID, Orthoo        Subjective: No change.  No new chest pain, fever, foot pain, drainage, redness or swelling.  No new dyspnea, abdominal pain.  Objective: Vitals:   11/04/17 2227 11/05/17 0500 11/05/17 0532 11/05/17 1649  BP: 121/62  128/77 (!) 137/95  Pulse: 86  95 87  Resp: 18  18 18   Temp: 98.3 F (36.8 C)  97.6 F (36.4 C) 97.8 F (36.6 C)  TempSrc:    Oral  SpO2:  99%  97% 98%  Weight:  111 kg (244 lb 11.4 oz)    Height:        Intake/Output Summary (Last 24 hours) at 11/05/2017 1847 Last data filed at 11/05/2017 1500 Gross per 24 hour  Intake 14.4 ml  Output -  Net 14.4 ml   Filed Weights   11/03/17 0458 11/04/17 0500 11/05/17 0500  Weight: 114.9 kg (253 lb 4.9 oz) 113.2 kg  (249 lb 9 oz) 111 kg (244 lb 11.4 oz)    Examination: General appearance: Elderly adult male, alert and in no acute distress. Tired, pretty listless.  HEENT: Anicteric, conjunctiva pink, lids and lashes normal. No nasal deformity, discharge, epistaxis.  Lips moist.   Skin: Warm and dry.  no jaundice.  No suspicious rashes or lesions. Cardiac: RRR, nl S1-S2, no murmurs appreciated.  Capillary refill is brisk.  JVP normal.  1+ trace LE edema.  Radia  pulses 2+ and symmetric. Respiratory: Normal respiratory rate and rhythm.  CTAB without rales or wheezes. Abdomen: Abdomen soft.  no TTP. No ascites, distension, hepatosplenomegaly.   MSK: No deformities or effusions. Neuro: Awake and alert.  EOMI, moves all extremities. Speech fluent.    Psych: Sensorium intact and responding to questions, attention normal. Affect normal.  Judgment and insight appear normal.    Data Reviewed: I have personally reviewed following labs and imaging studies:  CBC: Recent Labs  Lab 11/01/17 0309 11/02/17 0515 11/03/17 0352 11/04/17 0325 11/05/17 0445  WBC 7.6 6.9 7.6 6.7 6.2  HGB 9.6* 9.1* 9.4* 9.5* 8.9*  HCT 31.0* 29.9* 31.0* 30.4* 28.7*  MCV 102.0* 100.0 99.4 100.7* 99.3  PLT 276 245 272 243 224   Basic Metabolic Panel: Recent Labs  Lab 11/01/17 0309 11/02/17 0515 11/03/17 0550 11/04/17 0325 11/05/17 0445  NA 135 135 135 135 137  K 4.0 3.7 3.8 3.5 4.3  CL 105 103 100* 101 101  CO2 18* 22 22 25 27   GLUCOSE 100* 88 95 134* 129*  BUN 33* 32* 39* 38* 38*  CREATININE 2.07* 2.19* 2.23* 2.24* 2.41*  CALCIUM 8.7* 8.7* 8.8* 8.8* 8.7*   GFR: Estimated Creatinine Clearance: 31 mL/min (A) (by C-G formula based on SCr of 2.41 mg/dL (H)). Liver Function Tests: No results for input(s): AST, ALT, ALKPHOS, BILITOT, PROT, ALBUMIN in the last 168 hours. No results for input(s): LIPASE, AMYLASE in the last 168 hours. No results for input(s): AMMONIA in the last 168 hours. Coagulation Profile: Recent Labs    Lab 11/02/17 0515 11/03/17 0352 11/04/17 0325 11/05/17 0445  INR 1.36 1.35 1.43 1.57   Cardiac Enzymes: No results for input(s): CKTOTAL, CKMB, CKMBINDEX, TROPONINI in the last 168 hours. BNP (last 3 results) No results for input(s): PROBNP in the last 8760 hours. HbA1C: No results for input(s): HGBA1C in the last 72 hours. CBG: Recent Labs  Lab 11/04/17 1714 11/04/17 2225 11/05/17 0806 11/05/17 1239 11/05/17 1752  GLUCAP 170* 138* 125* 134* 141*   Lipid Profile: No results for input(s): CHOL, HDL, LDLCALC, TRIG, CHOLHDL, LDLDIRECT in the last 72 hours. Thyroid Function Tests: No results for input(s): TSH, T4TOTAL, FREET4, T3FREE, THYROIDAB in the last 72 hours. Anemia Panel: No results for input(s): VITAMINB12, FOLATE, FERRITIN, TIBC, IRON, RETICCTPCT in the last 72 hours. Urine analysis:    Component Value Date/Time   COLORURINE AMBER (A) 10/12/2017 0450   APPEARANCEUR CLOUDY (A) 10/12/2017 0450   LABSPEC 1.024 10/12/2017 0450   PHURINE 5.0 10/12/2017 0450   GLUCOSEU NEGATIVE 10/12/2017 0450  HGBUR LARGE (A) 10/12/2017 0450   BILIRUBINUR NEGATIVE 10/12/2017 0450   KETONESUR 5 (A) 10/12/2017 0450   PROTEINUR 100 (A) 10/12/2017 0450   UROBILINOGEN 0.2 07/05/2014 2117   NITRITE NEGATIVE 10/12/2017 0450   LEUKOCYTESUR TRACE (A) 10/12/2017 0450   Sepsis Labs: @LABRCNTIP (procalcitonin:4,lacticidven:4)  )No results found for this or any previous visit (from the past 240 hour(s)).       Radiology Studies: No results found.      Scheduled Meds: . carvedilol  18.75 mg Oral BID WC  . clopidogrel  75 mg Oral Daily  . collagenase   Topical Daily  . digoxin  0.125 mg Oral Daily  . furosemide  40 mg Oral Daily  . hydrALAZINE  10 mg Oral Q8H  . insulin aspart  0-15 Units Subcutaneous TID WC  . insulin aspart  0-5 Units Subcutaneous QHS  . insulin glargine  5 Units Subcutaneous BID  . isosorbide dinitrate  10 mg Oral TID  . levothyroxine  50 mcg Oral QAC  breakfast  . pantoprazole  40 mg Oral QHS  . [START ON 11/06/2017] potassium chloride  20 mEq Oral Daily  . protein supplement shake  11 oz Oral BID BM  . tamsulosin  0.4 mg Oral Daily  . Warfarin - Pharmacist Dosing Inpatient   Does not apply q1800   Continuous Infusions: . sodium chloride Stopped (10/26/17 1346)  . heparin 1,600 Units/hr (11/05/17 1406)     LOS: 13 days    Time spent: 25 minutes    Alberteen Sam, MD [facility] Pager [phone]  If 7PM-7AM, please contact night-coverage [email or web address] Password [email or web address] 11/05/2017, 6:47 PM

## 2017-11-05 NOTE — Care Management Important Message (Signed)
Important Message  Patient Details  Name: [name] MRN: [id number] Date of Birth: 25-Jul-1941   Medicare Important Message Given:  Yes    Braelon Sprung Abena 11/05/2017, 10:12 AM

## 2017-11-05 NOTE — Progress Notes (Signed)
ANTICOAGULATION CONSULT NOTE - Follow Up Consult  Pharmacy Consult for Heparin and Warfarin Indication: atrial fibrillation  Allergies  Allergen Reactions  . Allegra [Fexofenadine] Itching  . Crestor [Rosuvastatin Calcium] Other (See Comments)    Body aches  . Codeine Other (See Comments)    blisters, but able to take hydrocodone    Patient Measurements: Height: 5\' 8"  (172.7 cm) Weight: 244 lb 11.4 oz (111 kg) IBW/kg (Calculated) : 68.4 Heparin Dosing Weight: 94 kg  Vital Signs: Temp: 97.6 F (36.4 C) (12/21 0532) BP: 128/77 (12/21 0532) Pulse Rate: 95 (12/21 0532)  Labs: Recent Labs    11/03/17 0352 11/03/17 0550  11/04/17 0325  11/04/17 2221 11/05/17 0445 11/05/17 1140  HGB 9.4*  --   --  9.5*  --   --  8.9*  --   HCT 31.0*  --   --  30.4*  --   --  28.7*  --   PLT 272  --   --  243  --   --  224  --   LABPROT 16.5*  --   --  17.3*  --   --  18.7*  --   INR 1.35  --   --  1.43  --   --  1.57  --   HEPARINUNFRC 0.16*  --    < > 0.18*   < > 0.63 0.48 0.56  CREATININE  --  2.23*  --  2.24*  --   --  2.41*  --    < > = values in this interval not displayed.    Estimated Creatinine Clearance: 31 mL/min (A) (by C-G formula based on SCr of 2.41 mg/dL (H)).   Medications:  Scheduled:  . carvedilol  18.75 mg Oral BID WC  . clopidogrel  75 mg Oral Daily  . collagenase   Topical Daily  . digoxin  0.125 mg Oral Daily  . furosemide  40 mg Oral Daily  . hydrALAZINE  10 mg Oral Q8H  . insulin aspart  0-15 Units Subcutaneous TID WC  . insulin aspart  0-5 Units Subcutaneous QHS  . insulin glargine  5 Units Subcutaneous BID  . isosorbide dinitrate  10 mg Oral TID  . levothyroxine  50 mcg Oral QAC breakfast  . pantoprazole  40 mg Oral QHS  . [START ON 11/06/2017] potassium chloride  20 mEq Oral Daily  . protein supplement shake  11 oz Oral BID BM  . tamsulosin  0.4 mg Oral Daily  . Warfarin - Pharmacist Dosing Inpatient   Does not apply q1800   Infusions:  . sodium  chloride Stopped (10/26/17 1346)  . heparin 1,600 Units/hr (11/05/17 1406)   Assessment: 76 yo M on Warfarin PTA for hx afib.  Warfarin was initially held in the setting of GIB on previous admission 11/12-11/30  (last pta warfarin dose 11/12). At that time the plan on discharge was to resume warfarin in 2 weeks.  Currently GIB noted to be resolved.  This admission 10/23/17.   Patient was started on heparin IV drip 10/31/17 bridging back to warfarin. Warfarin restarted on 11/01/17 Heparin level 0.56 on 1700 units/hr IV heparin drip. INR 1.36 >1.35>1.43>1.57, after warfarin 4mg . 4mg , 5mg  and 5mg   -- (PTA dose = 5mg  daily except 2.5mg  MWF)- last taken 11/12. No bleeding noted .  Hb 8.6 on 12/8 >>7.8>>9.5 >8.9 today, pltc wnl. Cautious dosing due to recent GIB on previous admit 11/12-11/30.  No bleeding noted or per RN's report. CKD stage 3:  (  Baseline crt 1.9-2)  SCr declined to 2.07, trending back up some, 2.23>2.24>2.41    Goal for heparin level is at lower end of therapeutic range or 0.3-0.5 due to recent h/o GIB.    Goal of Therapy:  INR 2-3 Heparin level 0.3-0.5 units/ml (lower goal due to recent GIB) Monitor platelets by anticoagulation protocol: Yes   Plan:  Decrease IV heparin rate to 1600 units/hr Warfarin 6 mg x1 Daily HL, INR, CBC (IV team to draw labs , due to difficult stick) Monitor for s/sx of bleeding  Thank you for allowing pharmacy to be part of this patients care team.  [name], RPh Clinical Pharmacist 8A-4P [phone] [id number]-10P [phone] [facility] [phone] 11/05/2017 3:34 PM 3:34 PM

## 2017-11-05 NOTE — Progress Notes (Signed)
ANTICOAGULATION CONSULT NOTE - Follow Up Consult  Pharmacy Consult for Heparin and Warfarin Indication: atrial fibrillation  Allergies  Allergen Reactions  . Allegra [Fexofenadine] Itching  . Crestor [Rosuvastatin Calcium] Other (See Comments)    Body aches  . Codeine Other (See Comments)    blisters, but able to take hydrocodone    Patient Measurements: Height: 5\' 8"  (172.7 cm) Weight: 244 lb 11.4 oz (111 kg) IBW/kg (Calculated) : 68.4 Heparin Dosing Weight: 94 kg  Vital Signs: Temp: 98.3 F (36.8 C) (12/20 2227) BP: 121/62 (12/20 2227) Pulse Rate: 86 (12/20 2227)  Labs: Recent Labs    11/03/17 0352 11/03/17 0550  11/04/17 0325 11/04/17 1417 11/04/17 2221 11/05/17 0445  HGB 9.4*  --   --  9.5*  --   --  8.9*  HCT 31.0*  --   --  30.4*  --   --  28.7*  PLT 272  --   --  243  --   --  224  LABPROT 16.5*  --   --  17.3*  --   --  18.7*  INR 1.35  --   --  1.43  --   --  1.57  HEPARINUNFRC 0.16*  --    < > 0.18* 0.57 0.63 0.48  CREATININE  --  2.23*  --  2.24*  --   --   --    < > = values in this interval not displayed.    Estimated Creatinine Clearance: 33.4 mL/min (A) (by C-G formula based on SCr of 2.24 mg/dL (H)).   Medications:  Scheduled:  . carvedilol  18.75 mg Oral BID WC  . clopidogrel  75 mg Oral Daily  . collagenase   Topical Daily  . digoxin  0.125 mg Oral Daily  . furosemide  40 mg Intravenous BID  . hydrALAZINE  10 mg Oral Q8H  . insulin aspart  0-15 Units Subcutaneous TID WC  . insulin aspart  0-5 Units Subcutaneous QHS  . insulin glargine  5 Units Subcutaneous BID  . isosorbide dinitrate  10 mg Oral TID  . levothyroxine  50 mcg Oral QAC breakfast  . pantoprazole  40 mg Oral QHS  . potassium chloride  20 mEq Oral BID  . protein supplement shake  11 oz Oral BID BM  . tamsulosin  0.4 mg Oral Daily  . Warfarin - Pharmacist Dosing Inpatient   Does not apply q1800   Infusions:  . sodium chloride Stopped (10/26/17 1346)  . heparin 1,700  Units/hr (11/04/17 2335)    Assessment: 76 yo M on Warfarin PTA for hx afib.  Warfarin was initially held in the setting of GIB (last dose 11/12).  GIB now resolved.  Pt was started on heparin bridging back to warfarin.  Heparin level therapeutic x 1 after rate decrease  Goal of Therapy:  INR 2-3 Heparin level 0.3-0.5 units/ml (lower goal due to recent GIB) Monitor platelets by anticoagulation protocol: Yes   Plan:  Cont heparin 1700 units/hr 1200 HL  13/12, PharmD, BCPS Clinical Pharmacist Phone: [phone]

## 2017-11-05 NOTE — Progress Notes (Signed)
Progress Note  Patient Name: [name] Date of Encounter: 11/05/2017  Primary Cardiologist: Dr. Diona Browner, Dr. Johney Frame (EP)   Subjective   Mild DOE; no chest pain  Inpatient Medications    Scheduled Meds: . carvedilol  18.75 mg Oral BID WC  . clopidogrel  75 mg Oral Daily  . collagenase   Topical Daily  . digoxin  0.125 mg Oral Daily  . furosemide  40 mg Oral BID  . hydrALAZINE  10 mg Oral Q8H  . insulin aspart  0-15 Units Subcutaneous TID WC  . insulin aspart  0-5 Units Subcutaneous QHS  . insulin glargine  5 Units Subcutaneous BID  . isosorbide dinitrate  10 mg Oral TID  . levothyroxine  50 mcg Oral QAC breakfast  . pantoprazole  40 mg Oral QHS  . potassium chloride  20 mEq Oral BID  . protein supplement shake  11 oz Oral BID BM  . tamsulosin  0.4 mg Oral Daily  . Warfarin - Pharmacist Dosing Inpatient   Does not apply q1800   Continuous Infusions: . sodium chloride Stopped (10/26/17 1346)  . heparin 1,700 Units/hr (11/04/17 2335)   PRN Meds: sodium chloride, acetaminophen (TYLENOL) oral liquid 160 mg/5 mL, camphor-menthol, sodium chloride flush   Vital Signs    Vitals:   11/04/17 1749 11/04/17 2227 11/05/17 0500 11/05/17 0532  BP: 130/66 121/62  128/77  Pulse:  86  95  Resp:  18  18  Temp:  98.3 F (36.8 C)  97.6 F (36.4 C)  TempSrc:      SpO2:  99%  97%  Weight:   244 lb 11.4 oz (111 kg)   Height:       No intake or output data in the 24 hours ending 11/05/17 1249 Filed Weights   11/03/17 0458 11/04/17 0500 11/05/17 0500  Weight: 253 lb 4.9 oz (114.9 kg) 249 lb 9 oz (113.2 kg) 244 lb 11.4 oz (111 kg)     Physical Exam   General: WD, obese NAD Head: normal Neck: supple, no JVD Lungs:  CTA; no wheeze Heart: irregular, no murmur Abdomen: soft, NT, ND, no masses Extremities: 1+ edema Neuro: No focal findings  Labs    Chemistry Recent Labs  Lab 11/03/17 0550 11/04/17 0325 11/05/17 0445  NA 135 135 137  K 3.8 3.5 4.3  CL 100* 101  101  CO2 22 25 27   GLUCOSE 95 134* 129*  BUN 39* 38* 38*  CREATININE 2.23* 2.24* 2.41*  CALCIUM 8.8* 8.8* 8.7*  GFRNONAA 27* 27* 24*  GFRAA 31* 31* 28*  ANIONGAP 13 9 9      Hematology Recent Labs  Lab 11/03/17 0352 11/04/17 0325 11/05/17 0445  WBC 7.6 6.7 6.2  RBC 3.12* 3.02* 2.89*  HGB 9.4* 9.5* 8.9*  HCT 31.0* 30.4* 28.7*  MCV 99.4 100.7* 99.3  MCH 30.1 31.5 30.8  MCHC 30.3 31.3 31.0  RDW 19.4* 19.5* 19.2*  PLT 272 243 224    Telemetry    Afib with v pacing; rate reasonably well controlled - Personally Reviewed    Cardiac Studies   Echo 10/13/17: Study Conclusions - Left ventricle: Diffuse hypokinesis worse in inferior wall and   abnormal septal motion The cavity size was moderately dilated.   Wall thickness was increased in a pattern of severe LVH. Systolic   function was severely reduced. The estimated ejection fraction   was in the range of 25% to 30%. Doppler parameters are consistent   with abnormal left  ventricular relaxation (grade 1 diastolic   dysfunction). - Mitral valve: Calcified annulus. Mildly thickened leaflets .   There was mild regurgitation. - Atrial septum: No defect or patent foramen ovale was identified.   Echo 03/03/17: Study Conclusions - Procedure narrative: Transthoracic echocardiography. Image   quality was suboptimal. The study was technically difficult, as a   result of poor sound wave transmission and body habitus. - Left ventricle: The cavity size was normal. Wall thickness was   increased in a pattern of moderate LVH. Systolic function was   normal. The estimated ejection fraction was in the range of 55%   to 60%. Images were inadequate for LV wall motion assessment. No   gross regional variation on apical 4-chamber views. Doppler   parameters are consistent with abnormal left ventricular   relaxation (grade 1 diastolic dysfunction). Doppler parameters   are consistent with high ventricular filling pressure. - Aortic  valve: Mildly to moderately calcified annulus. - Mitral valve: Mildly calcified annulus. There was mild   regurgitation. - Left atrium: The atrium was moderately dilated. - Right ventricle: Pacer wire or catheter noted in right ventricle.  Patient Profile     76 y.o. male admitted with altered mental status and respiratory failure. He hasBiVAICD and PAF, elevated troponin PAF.EP interrogated device and reported he had been in Afib since approximately 09/19/17. LV function is worse compared to previous echo (echocardiogram November 28 showed ejection fraction 25-30%, grade 1 diastolic dysfunction and mild mitral regurgitation).   Assessment & Plan     1 acute on chronic systolic congestive heart failure-volume status continues to improve; I/O not recorded; change lasix to 40 mg daily. Pt instructed on fluid restriction and low NA diet.   2 ischemic cardiomyopathy-previous echocardiogram April 2018 showed his LV function had normalized.  It is now decreased again.  Question if this may be related to atrial fibrillation with rapid ventricular response.  Could also be ischemia mediated given non-ST elevation myocardial infarction (felt to be demand ischemia). Continue carvedilol 18.75 mg twice daily; continue digoxin 0.125 mg daily and hydralazine/nitrates.  We will avoid ACE inhibitor or ARB given renal insufficiency.  3 non-ST elevation myocardial infarction-continue Plavix and beta-blocker.  He apparently is intolerant to statins.  He will need ischemia evaluation when he recovers from this hospitalization. Would likely begin with nuclear study as he would be at high risk for contrast nephropathy given baseline renal insuff. Will schedule nuclear study following DC at [facility].  4 persistant atrial fibrillation-patient remains in atrial fibrillation.  Rate is reasonably well controlled. Continue coreg but increase to 18.75 mg BID; continue digoxin 0.125 mg daily (check level 2 weeks after DC).  Given  history of TIA would continue heparin until INR greater than or equal to 2.  We will plan cardioverting 4-6 weeks after INR therapeutic.  5 status post ICD  6 peripheral vascular disease and possible cellulitis-management per primary care.    7 chronic stage III kidney disease-follow renal function closely with diuresis.   Olga Millers, MD

## 2017-11-05 NOTE — Consult Note (Signed)
   [facility] Inpatient Consult   11/05/2017  JOHNWILLIAM SHEPPERSON 01/13/1941 [id number]    [facility] Care Management follow up.  Spoke with inpatient RNCM who states Mr. Nickolson has declined SNF placement and the plan is for him to go home with home health.  Went to bedside to remind Mr. Amezcua that [facility] Care Management will follow up with him post discharge. His [facility] Care Management packet remained in his bag.  Wife not present during bedside visit.  Will go ahead and make referral for [facility] team since Mr. Kassel has declined SNF placement thus far.   Of note, [facility] Care Management consent was signed during prior bedside visit.   Raiford Noble, MSN-Ed, RN,BSN [facility] Liaison [phone]

## 2017-11-06 DIAGNOSIS — I110 Hypertensive heart disease with heart failure: Secondary | ICD-10-CM

## 2017-11-06 DIAGNOSIS — I5032 Chronic diastolic (congestive) heart failure: Secondary | ICD-10-CM

## 2017-11-06 LAB — BASIC METABOLIC PANEL
ANION GAP: 8 (ref 5–15)
BUN: 38 mg/dL — ABNORMAL HIGH (ref 6–20)
CO2: 28 mmol/L (ref 22–32)
Calcium: 8.7 mg/dL — ABNORMAL LOW (ref 8.9–10.3)
Chloride: 100 mmol/L — ABNORMAL LOW (ref 101–111)
Creatinine, Ser: 2.53 mg/dL — ABNORMAL HIGH (ref 0.61–1.24)
GFR calc Af Amer: 27 mL/min — ABNORMAL LOW (ref >60)
GFR, EST NON AFRICAN AMERICAN: 23 mL/min — AB (ref >60)
Glucose, Bld: 157 mg/dL — ABNORMAL HIGH (ref 65–99)
POTASSIUM: 3.5 mmol/L (ref 3.5–5.1)
SODIUM: 136 mmol/L (ref 135–145)

## 2017-11-06 LAB — CBC
HEMATOCRIT: 28.6 % — AB (ref 39.0–52.0)
HEMOGLOBIN: 8.7 g/dL — AB (ref 13.0–17.0)
MCH: 30.4 pg (ref 26.0–34.0)
MCHC: 30.4 g/dL (ref 30.0–36.0)
MCV: 100 fL (ref 78.0–100.0)
Platelets: 219 10*3/uL (ref 150–400)
RBC: 2.86 MIL/uL — ABNORMAL LOW (ref 4.22–5.81)
RDW: 18.8 % — ABNORMAL HIGH (ref 11.5–15.5)
WBC: 5.8 10*3/uL (ref 4.0–10.5)

## 2017-11-06 LAB — PROTIME-INR
INR: 1.82
Prothrombin Time: 20.9 seconds — ABNORMAL HIGH (ref 11.4–15.2)

## 2017-11-06 LAB — GLUCOSE, CAPILLARY: GLUCOSE-CAPILLARY: 148 mg/dL — AB (ref 65–99)

## 2017-11-06 LAB — HEPARIN LEVEL (UNFRACTIONATED): HEPARIN UNFRACTIONATED: 0.4 [IU]/mL (ref 0.30–0.70)

## 2017-11-06 MED ORDER — HYDRALAZINE HCL 10 MG PO TABS: 10.0000 mg | ORAL_TABLET | Freq: Three times a day (TID) | ORAL | 0 refills | Status: AC

## 2017-11-06 MED ORDER — WARFARIN SODIUM 5 MG PO TABS: 5.0000 mg | ORAL_TABLET | Freq: Once | ORAL | Status: DC

## 2017-11-06 MED ORDER — ISOSORBIDE DINITRATE 10 MG PO TABS: 10.0000 mg | ORAL_TABLET | Freq: Three times a day (TID) | ORAL | 0 refills | Status: AC

## 2017-11-06 MED ORDER — PREMIER PROTEIN SHAKE: 11.0000 [oz_av] | Freq: Two times a day (BID) | ORAL | 0 refills | Status: AC

## 2017-11-06 MED ORDER — PANTOPRAZOLE SODIUM 40 MG PO TBEC: 40.0000 mg | DELAYED_RELEASE_TABLET | Freq: Two times a day (BID) | ORAL | 0 refills | Status: AC

## 2017-11-06 MED ORDER — POTASSIUM CHLORIDE CRYS ER 20 MEQ PO TBCR: 20.0000 meq | EXTENDED_RELEASE_TABLET | Freq: Every day | ORAL | 0 refills | Status: AC

## 2017-11-06 MED ORDER — CARVEDILOL 6.25 MG PO TABS: 18.7500 mg | ORAL_TABLET | Freq: Two times a day (BID) | ORAL | 0 refills | Status: AC

## 2017-11-06 NOTE — Discharge Summary (Signed)
Physician Discharge Summary  [name] MGQ:[id number] DOB: [birth year] DOA: 10/23/2017  PCP: [name], MD  Admit date: 10/23/2017 Discharge date: 11/06/2017  Admitted From: SNF  Disposition:  Home   Recommendations for Outpatient Follow-up:  1. Follow up with PCP in 1 week 2. Please obtain INR and BMP on Wednesday Dec 26 and report results by phone to on-call Dr. For Dr. [name] or to Dr. Margaretmary Eddy office 3. If patient continues to have lower urinary tract symptoms, please assist with referral to Urology 4. Follow up with Cardiology in 2-3 weeks (already scheduled) 5. Review goals of care, CODE STATUS    Home Health: Yes  Equipment/Devices: None  Discharge Condition: Poor  CODE STATUS: FULL Diet recommendation: Cardiac, diabetic  Brief/Interim Summary: 76 yo M with CAD, DM, pAF, RA, CHF EF 25% presented with DFI.  Basically, he was admitted 11/12 to 11/30 with sepsis from DFI.  That hospitalization was actually complicated by massive GI bleed, hemorrhagic shock requiring intubation/transfusions.  He had two EGDs, nothing could be clipped, but the bleeding stopped and he stabilized.  By the end of the hospitalization, he had an arteriogram and angioplasty by Vascular surgery on 11/27.    He went to SNF, but was only there a week before he was readmitted with septic shock, delirium on 12/8.  He was intubated again, started on broad spectrum antibiotics.  The source has never been entirely clear, not sure if it was cellulitis/DFI (this was doubted) vs UTI?  -Regardless, he was intubated 12/8-12/10 -Ultrasound scrotum 12/13, just edema, hydrocele -3 phase bone scan 12/14, possible osteomyelitis -CT foot, 12/15 doubtful osteomyelitis -Vascular evaluated the patient, felt his foot was doing well, could be followed as an oupatient. -Urology evaluated the patient for recurrent urinary retention, recommended foley until able to stand to urinate, Flomax, follow up in their office  in 3-4 weeks (early Jan) -ID evaluated the patient, felt there was no clear source of infection, recommended observing off antibiotics -Stable off antibiotics from 12/16 til now    Septic shock 2/2 to LLE cellulitis/wound or UTI, unclear source Completed IV vancomycin and PO Cipro.  VVS and orthopedics were involved, felt his wounds were adequately treated.  ID were consulted, recommended monitoring off antibiotics after 7 days.  From 12/14 until discharge, no new fever, leukocytosis.   Ischemic CM with EF 25-30% (10/13/17) Positive 4L from treatment of sepsis, diuresed to net even by discharge and resumed home Demadex.  Isordil was added, hydralazine reduced, and BB increased.    Paroxysmal a-fib Warfarin was resumed.  Had had GI bleed in November, no recurrence this hospitalization.  Type 2 NSTEMI PVD Demand NSTEMI during this hospitalization. Outpatient ischemic workup per Cardiology.   Recent GI Bleed Noted that patient is on warfarin, Plavix, has high bleeding risk, but in light of demand NSTEMI during this hospitalization and recent angioplasty of peripheral artery, will continue Plavix and warfarin.    Enlarged scrotum r/o hydrocele vs hernia Urinary retention Patient had had Flomax started during last hospitalization for urinary retention.  Does not have scheduled Urology follow up, but if persistent LUTS, this would be reasonable.           Discharge Diagnoses:  Active Problems:   Diabetes mellitus (HCC)   Dyslipidemia   CAD, NATIVE VESSEL   SYSTOLIC HEART FAILURE, CHRONIC   Biventricular ICD (implantable cardioverter-defibrillator) in place   Cardiomyopathy, ischemic   Paroxysmal atrial fibrillation (HCC)   CKD (chronic kidney disease) stage 3,  GFR 30-59 ml/min (HCC)   PVD (peripheral vascular disease) with claudication (HCC)   Rheumatoid arthritis involving multiple sites with positive rheumatoid factor (HCC)   Plantar ulcer of left foot  [facility])    Discharge Instructions  Discharge Instructions    AMB Referral to [facility] Care Management   Complete by:  As directed    Please assign [employer] member due to high risk for readmission to [facility] RNCM for transition of care, [facility] LCSW will likely need placement from home since declined SNF and [facility] Pharmacist for 30 day med rec to help lower readmission risk. Declined SNF placement. Written consent obtained. Thanks. Raiford Noble, MSN-Ed, RN,BSN [facility] [phone]   Reason for consult:  Please assign [facility] RNCM, [facility] LCSW and [facility] Pharmacist   Diagnoses of:  Heart Failure   Expected date of contact:  1-3 days (reserved for hospital discharges)   Diet - low sodium heart healthy   Complete by:  As directed    Discharge instructions   Complete by:  As directed    Your medications have changed:  Your carvedilol/Coreg dose was increased to 18.75 mg twice daily Your pantoprazole dose has increased to twice daily  Resume your warfarin as before Resume your Demadex as before Resume your Plavix as before, 75 mg daily  STOP IMDUR REDUCE hydralazine to 10 mg three times daily  Continue the tamsulosin/Flomax 0.4 mg daily  Follow up with your primary care doctor as soon as possible. Seek care sooner if you develop worsening pain, fever, redness/swelling or drainage from your foot ulcer. Follow up with Dr. Diona Browner when they call you with an appointment.  Have Home Health check your INR and kidney function on Wednesday and fax to Dr. Margaretmary Eddy office.  If you have any black and tarry stools, or blood in bowel movements, call Dr. Margaretmary Eddy office immediately and stop Plavix and warfarin.   Increase activity slowly   Complete by:  As directed      Allergies as of 11/06/2017      Reactions   Allegra [fexofenadine] Itching   Crestor [rosuvastatin Calcium] Other (See Comments)   Body aches   Codeine Other (See Comments)   blisters, but able to take  hydrocodone      Medication List    STOP taking these medications   isosorbide mononitrate 30 MG 24 hr tablet Commonly known as:  IMDUR   lisinopril 2.5 MG tablet Commonly known as:  PRINIVIL,ZESTRIL     TAKE these medications   carvedilol 6.25 MG tablet Commonly known as:  COREG Take 3 tablets (18.75 mg total) by mouth 2 (two) times daily with a meal. What changed:    medication strength  See the new instructions.   clopidogrel 75 MG tablet Commonly known as:  PLAVIX Take 1 tablet (75 mg total) by mouth daily.   collagenase ointment Commonly known as:  SANTYL Apply 1 application topically daily. Apply to the affected area daily plus dry dressing   hydrALAZINE 10 MG tablet Commonly known as:  APRESOLINE Take 1 tablet (10 mg total) by mouth every 8 (eight) hours. What changed:    medication strength  how much to take  when to take this   HYDROcodone-acetaminophen 7.5-325 MG tablet Commonly known as:  NORCO Take 1 tablet by mouth every 6 (six) hours as needed for moderate pain.   insulin lispro protamine-lispro (75-25) 100 UNIT/ML Susp injection Commonly known as:  HUMALOG 75/25 MIX Inject 10 Units into the  skin 2 (two) times daily with a meal. Take 10units BID with meals What changed:  additional instructions   isosorbide dinitrate 10 MG tablet Commonly known as:  ISORDIL Take 1 tablet (10 mg total) by mouth 3 (three) times daily.   l-methylfolate-B6-B12 3-35-2 MG Tabs tablet Commonly known as:  METANX Take 1 tablet by mouth daily.   levothyroxine 50 MCG tablet Commonly known as:  SYNTHROID, LEVOTHROID Take 50 mcg by mouth daily.   nitroGLYCERIN 0.4 MG SL tablet Commonly known as:  NITROSTAT Place 0.4 mg under the tongue every 5 (five) minutes as needed for chest pain. For chest pain   pantoprazole 40 MG tablet Commonly known as:  PROTONIX Take 1 tablet (40 mg total) by mouth 2 (two) times daily before a meal.   potassium chloride SA 20 MEQ  tablet Commonly known as:  K-DUR,KLOR-CON Take 1 tablet (20 mEq total) by mouth daily. Start taking on:  11/07/2017   protein supplement shake Liqd Commonly known as:  PREMIER PROTEIN Take 325 mLs (11 oz total) by mouth 2 (two) times daily between meals.   tamsulosin 0.4 MG Caps capsule Commonly known as:  FLOMAX Take 0.4 mg by mouth daily as needed (bladder).   torsemide 10 MG tablet Commonly known as:  DEMADEX Take 1 tablet (10 mg total) by mouth 2 (two) times daily. What changed:    when to take this  additional instructions   TYLENOL 8 HOUR ARTHRITIS PAIN 650 MG CR tablet Generic drug:  acetaminophen Take 1 tablet by mouth daily as needed for pain.   Vitamin D3 10000 units Tabs Take 1,000 Units by mouth daily.   warfarin 2.5 MG tablet Commonly known as:  COUMADIN Take 2.5-5 mg by mouth See admin instructions. Pt takes 2.5mg  by mouth once daily on Mon, Wed, Fri - takes 5mg  once daily Tues, Thurs, Sat, Sun   Zinc 50 MG Caps Take 50 mg by mouth every evening.      Follow-up Information    03-08-1972, MD Follow up.   Specialty:  Orthopedic Surgery Contact information: [address] [place] [phone]        [facility] Follow up.   Specialty:  Home Health Services Why:  home health services arrange , office will call and set up home visits Contact information: [address] [place] [phone]          Allergies  Allergen Reactions  . Allegra [Fexofenadine] Itching  . Crestor [Rosuvastatin Calcium] Other (See Comments)    Body aches  . Codeine Other (See Comments)    blisters, but able to take hydrocodone    Consultations:  Cardiology  Intensive Care  ID  Orthopedics  Urology  Vascular surgery   Procedures/Studies: Dg Chest 1 View  Result Date: 10/13/2017 CLINICAL DATA:  Shortness of Breath EXAM: CHEST 1 VIEW COMPARISON:  10/10/2017 FINDINGS: Left AICD  remains in place, unchanged. Interval extubation. Right PICC line is unchanged. Cardiomegaly with vascular congestion. Low lung volumes with bibasilar atelectasis. IMPRESSION: Interval extubation. Cardiomegaly with vascular congestion, low volumes and bibasilar atelectasis. Electronically Signed   By: 10/12/2017 M.D.   On: 10/13/2017 09:05   Nm Bone Scan 3 Phase  Result Date: 10/29/2017 CLINICAL DATA:  Left foot pain and swelling after plantar puncture wound approximately 2 months ago. Evaluate for osteomyelitis. Unable to undergo MRI. EXAM: NUCLEAR MEDICINE 3-PHASE BONE SCAN TECHNIQUE: Radionuclide angiographic images, immediate static blood pool images, and  3-hour delayed static images were obtained of the feet and distal lower legs after intravenous injection of radiopharmaceutical. RADIOPHARMACEUTICALS:  21.3 mCi Tc-38m MDP COMPARISON:  Left foot radiographs 10/03/2017.  CT 09/28/2017. FINDINGS: Vascular phase: There is asymmetrically increased perfusion to the left foot relative to the right. Blood pool phase: There is prominent asymmetric blood pool activity within the left forefoot medially. Delayed phase: Delayed images demonstrate focal activity within the bases of the first and second proximal phalanges, adjacent to the first web space. There is bilateral midfoot activity associated with the Lisfranc joints, likely arthropathic. IMPRESSION: 1. Abnormal three-phase bone scan with increased perfusion, blood pool and delayed phase activity in the left forefoot. This appears to localize to the first and second proximal phalangeal bases on the delayed images and is suspicious for osteomyelitis. Consider follow-up CT for confirmation. 2. Delayed phase activity at the Lisfranc joint bilaterally, likely arthropathic. Electronically Signed   By: Carey Bullocks M.D.   On: 10/29/2017 16:46   US Scrotum  Result Date: 10/28/2017 CLINICAL DATA:  Bilateral full swelling. EXAM: SCROTAL ULTRASOUND DOPPLER  ULTRASOUND OF THE TESTICLES TECHNIQUE: Complete ultrasound examination of the testicles, epididymis, and other scrotal structures was performed. Color and spectral Doppler ultrasound were also utilized to evaluate blood flow to the testicles. COMPARISON:  CT 06/13/2013. FINDINGS: Right testicle Measurements: 4.4 x 2.3 x 2.8 cm. No mass or microlithiasis visualized. Left testicle Measurements: 3.8 x 2.2 x 3.5 cm. No mass or microlithiasis visualized. Right epididymis:  Normal in size and appearance. Left epididymis:  Normal in size and appearance. Hydrocele:  Small right. Varicocele:  Small left. Pulsed Doppler interrogation of both testes demonstrates bilateral color Doppler flow. Diffuse scrotal wall thickening. No scrotal wall fluid collections are noted. IMPRESSION: 1. Diffuse scrotal wall thickening. 2. Small right hydrocele. Small left varicocele. No evidence of testicular mass or torsion . Electronically Signed   By: Maisie Fus  Register   On: 10/28/2017 15:42   Ct Foot Left Wo Contrast  Result Date: 10/30/2017 CLINICAL DATA:  Diffuse soft tissue swelling. Stepped on a tack, now with subsequent infection. EXAM: CT OF THE LEFT FOOT WITHOUT CONTRAST TECHNIQUE: Multidetector CT imaging of the left foot was performed according to the standard protocol. Multiplanar CT image reconstructions were also generated. COMPARISON:  Radiographs 10/03/2017 FINDINGS: Diffuse and fairly marked subcutaneous soft tissue swelling/ edema/ fluid consistent with cellulitis. No discrete fluid collection to suggest a drainable abscess. Diffuse fatty change involving the foot and ankle musculature but no definite findings for myofasciitis or pyomyositis. The On the sagittal sequence there is a small open wound noted along the plantar aspect of the forefoot at the level of the first metatarsal head. No radiopaque foreign body is identified. No findings suspicious for the septic arthritis or osteomyelitis. Moderate midfoot degenerative  changes. There are also cystic changes in the distal calcaneus but no worrisome bone lesions. IMPRESSION: Diffuse cellulitis without discrete drainable soft tissue abscess or radiopaque foreign body. No definite CT findings for septic arthritis or osteomyelitis. Moderate degenerative changes. Electronically Signed   By: Rudie Meyer M.D.   On: 10/30/2017 15:49   Dg Chest Port 1 View  Result Date: 10/27/2017 CLINICAL DATA:  Shortness of breath, respiratory failure, CHF, coronary artery disease, sepsis. EXAM: PORTABLE CHEST 1 VIEW COMPARISON:  Portable chest x-ray of October 25, 2017 FINDINGS: The lungs are adequately inflated. The interstitial markings are increased though stable. The cardiac silhouette is enlarged. The pulmonary vascularity is mildly engorged. There is calcification in  the wall of the aortic arch. The ICD is in stable position. The trachea is midline. IMPRESSION: CHF with mild interstitial edema. No alveolar pneumonia nor pleural effusion. Thoracic aortic atherosclerosis. Electronically Signed   By: David  [place] M.D.   On: 10/27/2017 07:59   Dg Chest Port 1 View  Result Date: 10/25/2017 CLINICAL DATA:  76 year old male status post septic shock, intubated. EXAM: PORTABLE CHEST 1 VIEW COMPARISON:  10/24/2017 and earlier. FINDINGS: Portable AP semi upright view at 0410 hours. Extubated. Enteric tube removed. Stable right IJ central line. Stable left chest cardiac AICD. Mildly improved lung volumes. Stable to mildly improved ventilation with continued blunting of both costophrenic angles. Stable pulmonary vascularity without overt edema. No pneumothorax. Paucity of bowel gas in the upper abdomen. IMPRESSION: 1. Extubated and enteric tube removed. 2. Mildly improved ventilation with probable small pleural effusions and basilar atelectasis. Electronically Signed   By: Odessa Fleming M.D.   On: 10/25/2017 06:48   Dg Chest Port 1 View  Result Date: 10/24/2017 CLINICAL DATA:  Followup intubated  patient. EXAM: PORTABLE CHEST 1 VIEW COMPARISON:  10/23/2017 FINDINGS: Lung volumes are low. There is hazy lung base opacity, left greater than right, likely combination of small effusions and atelectasis. No pulmonary edema. No pneumothorax. Endotracheal tube, nasal/ orogastric tube, right internal jugular central venous line and left anterior chest wall biventricular cardioverter-defibrillator are stable in well positioned. IMPRESSION: 1. No significant change from the most recent prior study allowing for lower lung volumes on the current exam. 2. Mild, left greater than right, lung base atelectasis with probable small effusions. 3. Support apparatus is well positioned. Electronically Signed   By: Amie Portland M.D.   On: 10/24/2017 07:43   Dg Chest Port 1 View  Result Date: 10/23/2017 CLINICAL DATA:  76 year old male status post intubation. EXAM: PORTABLE CHEST 1 VIEW COMPARISON:  Earlier chest radiograph dated 10/23/2017 FINDINGS: Endotracheal tube with tip above the carina in similar positioning. Right IJ central venous catheter as seen previously. Left lung base densities may represent atelectatic changes. Pneumonia is not excluded. Overall there has been interval increase in the densities at the left lung base compared to the earlier radiograph. A small left pleural effusion is not entirely excluded. There is no pneumothorax. Stable cardiomegaly. Left pectoral AICD device. No acute osseous pathology. IMPRESSION: 1. Endotracheal tube and right IJ central line in similar position. 2. Interval increase in the left lung base densities which may represent atelectatic changes, although infiltrate is not excluded. A small left pleural effusion may be present. Electronically Signed   By: Elgie Collard M.D.   On: 10/23/2017 06:53   Dg Chest Port 1 View  Result Date: 10/10/2017 CLINICAL DATA:  Acute respiratory acidosis. EXAM: PORTABLE CHEST 1 VIEW COMPARISON:  10/09/2017 FINDINGS: Endotracheal tube  terminates 4.5 cm above the carina. ICD remains in place. Enteric tube courses into the stomach with tip not imaged. Right PICC terminates near the cavoatrial junction. The cardiac silhouette remains enlarged. Lung volumes remain diminished with mild central pulmonary vascular congestion. Asymmetric left basilar opacity has mildly improved. Minimal right basilar atelectasis is unchanged. There is likely a small persistent left pleural effusion. IMPRESSION: Improved left basilar aeration. Electronically Signed   By: Sebastian Ache M.D.   On: 10/10/2017 07:06   Dg Chest Port 1 View  Result Date: 10/09/2017 CLINICAL DATA:  Acute respiratory failure with hypoxia. EXAM: PORTABLE CHEST 1 VIEW COMPARISON:  10/08/2017 FINDINGS: Endotracheal tube terminates 4 cm above the carina. An ICD  remains in place. Right PICC terminates over the high right atrium. An enteric tube courses towards the left upper abdomen with tip not imaged. The cardiomediastinal silhouette is unchanged. Lung volumes remain diminished with mild pulmonary vascular congestion. Left greater than right basilar lung opacities are unchanged. There are persistent small left and possibly small right pleural effusions. No pneumothorax is identified. IMPRESSION: No change. Low lung volumes with left greater than right basilar opacity, likely atelectasis though pneumonia is possible on the left. Small pleural effusions. Electronically Signed   By: Sebastian AcheAllen  Grady M.D.   On: 10/09/2017 07:29   Dg Chest Port 1 View  Result Date: 10/08/2017 CLINICAL DATA:  Respiratory failure. EXAM: PORTABLE CHEST 1 VIEW COMPARISON:  10/07/2017. FINDINGS: Endotracheal tube, NG tube, right PICC line stable position. Cardiac pacer with lead tips in the right atrium and right ventricle. Cardiomegaly. Mild interstitial prominence noted bilaterally consistent with CHF. Low lung volumes with basilar atelectasis. Small left pleural effusion again noted . No pneumothorax. IMPRESSION: 1.  Cardiac scratched it lines and tubes in stable position. 2. Cardiac pacer stable position. Cardiomegaly with mild bilateral interstitial prominence suggesting CHF noted on today's exam. Small left pleural effusion. 3.  Low lung volumes with bibasilar atelectasis . Electronically Signed   By: Maisie Fushomas  Register   On: 10/08/2017 06:32   Dg Abd Portable 1v  Result Date: 10/23/2017 CLINICAL DATA:  76 year old male with an orogastric tube in place. Assess tube location. EXAM: PORTABLE ABDOMEN - 1 VIEW COMPARISON:  Prior abdominal radiograph 10/06/2017 FINDINGS: A single radiograph of the upper abdomen is submitted for review. The orogastric tube is in good position projecting over the gastric body. Incompletely imaged biventricular cardiac rhythm maintenance device. The tip of a venous catheter overlies the mid right atrium. Mild patchy airspace opacity in the left lower lobe may reflect infiltrate or atelectasis. The visualized bowel gas pattern is not obstructed. IMPRESSION: 1. Well-positioned gastric tube projects over the gastric body. 2. Left basilar patchy airspace opacity may reflect atelectasis or infiltrate. Electronically Signed   By: Malachy MoanHeath  McCullough M.D.   On: 10/23/2017 08:35      Subjective: Feels at baseline.  Tired but ready to go.  No new pain, redness of feet.  No change inswelling.  No new orthopnea.  Discharge Exam: Vitals:   11/06/17 0453 11/06/17 0529  BP: 115/60 127/69  Pulse: 80 88  Resp: 18 18  Temp: 97.9 F (36.6 C) (!) 97.4 F (36.3 C)  SpO2: 96% 100%   Vitals:   11/05/17 1649 11/05/17 2124 11/06/17 0453 11/06/17 0529  BP: (!) 137/95 (!) 110/94 115/60 127/69  Pulse: 87 93 80 88  Resp: 18 18 18 18   Temp: 97.8 F (36.6 C) 98.2 F (36.8 C) 97.9 F (36.6 C) (!) 97.4 F (36.3 C)  TempSrc: Oral Oral  Oral  SpO2: 98% 97% 96% 100%  Weight:   110.1 kg (242 lb 11.6 oz)   Height:        General: Pt is alert, awake, not in acute distress Cardiovascular: RRR, S1/S2 +,  no rubs, no gallops Respiratory: CTA bilaterally, no wheezing, no rhonchi Abdominal: Soft, NT, ND, bowel sounds + Extremities: 1+ edema LE edema, but improved, no cyanosis    The results of significant diagnostics from this hospitalization (including imaging, microbiology, ancillary and laboratory) are listed below for reference.     Microbiology: No results found for this or any previous visit (from the past 240 hour(s)).   Labs: BNP (last 3  results) Recent Labs    10/06/17 0445 10/08/17 0500  BNP 501.7* 498.0*   Basic Metabolic Panel: Recent Labs  Lab 11/02/17 0515 11/03/17 0550 11/04/17 0325 11/05/17 0445 11/06/17 0444  NA 135 135 135 137 136  K 3.7 3.8 3.5 4.3 3.5  CL 103 100* 101 101 100*  CO2 22 22 25 27 28   GLUCOSE 88 95 134* 129* 157*  BUN 32* 39* 38* 38* 38*  CREATININE 2.19* 2.23* 2.24* 2.41* 2.53*  CALCIUM 8.7* 8.8* 8.8* 8.7* 8.7*   Liver Function Tests: No results for input(s): AST, ALT, ALKPHOS, BILITOT, PROT, ALBUMIN in the last 168 hours. No results for input(s): LIPASE, AMYLASE in the last 168 hours. No results for input(s): AMMONIA in the last 168 hours. CBC: Recent Labs  Lab 11/02/17 0515 11/03/17 0352 11/04/17 0325 11/05/17 0445 11/06/17 0444  WBC 6.9 7.6 6.7 6.2 5.8  HGB 9.1* 9.4* 9.5* 8.9* 8.7*  HCT 29.9* 31.0* 30.4* 28.7* 28.6*  MCV 100.0 99.4 100.7* 99.3 100.0  PLT 245 272 243 224 219   Cardiac Enzymes: No results for input(s): CKTOTAL, CKMB, CKMBINDEX, TROPONINI in the last 168 hours. BNP: Invalid input(s): POCBNP CBG: Recent Labs  Lab 11/05/17 0806 11/05/17 1239 11/05/17 1752 11/05/17 2024 11/06/17 0741  GLUCAP 125* 134* 141* 154* 148*   D-Dimer No results for input(s): DDIMER in the last 72 hours. Hgb A1c No results for input(s): HGBA1C in the last 72 hours. Lipid Profile No results for input(s): CHOL, HDL, LDLCALC, TRIG, CHOLHDL, LDLDIRECT in the last 72 hours. Thyroid function studies No results for input(s):  TSH, T4TOTAL, T3FREE, THYROIDAB in the last 72 hours.  Invalid input(s): FREET3 Anemia work up No results for input(s): VITAMINB12, FOLATE, FERRITIN, TIBC, IRON, RETICCTPCT in the last 72 hours. Urinalysis    Component Value Date/Time   COLORURINE AMBER (A) 10/12/2017 0450   APPEARANCEUR CLOUDY (A) 10/12/2017 0450   LABSPEC 1.024 10/12/2017 0450   PHURINE 5.0 10/12/2017 0450   GLUCOSEU NEGATIVE 10/12/2017 0450   HGBUR LARGE (A) 10/12/2017 0450   BILIRUBINUR NEGATIVE 10/12/2017 0450   KETONESUR 5 (A) 10/12/2017 0450   PROTEINUR 100 (A) 10/12/2017 0450   UROBILINOGEN 0.2 07/05/2014 2117   NITRITE NEGATIVE 10/12/2017 0450   LEUKOCYTESUR TRACE (A) 10/12/2017 0450   Sepsis Labs Invalid input(s): PROCALCITONIN,  WBC,  LACTICIDVEN Microbiology No results found for this or any previous visit (from the past 240 hour(s)).   Time coordinating discharge: Over 30 minutes  SIGNED:   Alberteen Sam, MD  [facility] 11/06/2017, 10:28 AM   If 7PM-7AM, please contact night-coverage [email or web address] Password [email or web address]

## 2017-11-06 NOTE — Care Management (Signed)
Pt to discharge home today - continues to refuse SNF.  Pt states his wife and son will provide 24 hour supervision.  Pt will transport home via private vehicle driven by wife. Pt declined additional HH services - states "I don't need any of that".  CM informed pt that he can ask his PCP for [facility] orders once discharge.  Pt has walker in the home - bedside nurse confirmed pt has been stable when ambulating with walker.  CM confirmed that [facility] (referral given by CM [facility]) will accept pt for [facility] and PT.  No other CM needs determined prior this note being written

## 2017-11-06 NOTE — Progress Notes (Signed)
0700 Bedside shift report, pt resting comfortably in bed. NAD, wants to go home, WCTM.   0800 Pt assessed, see flowsheet. Denies pain, SOB, no complaints. WCTM.   0845 Pt sitting up on side of bed eating breakfast.   1000 MD here to see pt, discharge orders received. Pt medicated per MAR.  1030 Midline and INT removed without difficulty, tele box and pulse ox. Pt resting comfortably in bed.  1130 Discharge instructions reviewed with pt and wife, new medications, wound care, follow up appointments, and all questions answered. RN also reviewed HHC orders and company.   1150 Pt out to car via wheelchair with dentures (top), cell phone, charger, glasses, glasses case, and personal belongings. Wife checked room for any other personal belongings and none found.

## 2017-11-06 NOTE — Progress Notes (Signed)
ANTICOAGULATION CONSULT NOTE - Follow Up Consult  Pharmacy Consult for Heparin and Warfarin Indication: atrial fibrillation  Patient Measurements: Height: 5\' 8"  (172.7 cm) Weight: 242 lb 11.6 oz (110.1 kg) IBW/kg (Calculated) : 68.4 Heparin Dosing Weight: 94 kg  Labs: Recent Labs    11/04/17 0325  11/05/17 0445 11/05/17 1140 11/06/17 0443 11/06/17 0444  HGB 9.5*  --  8.9*  --   --  8.7*  HCT 30.4*  --  28.7*  --   --  28.6*  PLT 243  --  224  --   --  219  LABPROT 17.3*  --  18.7*  --   --  20.9*  INR 1.43  --  1.57  --   --  1.82  HEPARINUNFRC 0.18*   < > 0.48 0.56 0.40  --   CREATININE 2.24*  --  2.41*  --   --  2.53*   < > = values in this interval not displayed.   Estimated Creatinine Clearance: 29.4 mL/min (A) (by C-G formula based on SCr of 2.53 mg/dL (H)).  Assessment: 76 yo M on Warfarin PTA for hx afib.  Warfarin was initially held in the setting of GIB on previous admission 11/12-11/30  (last pta warfarin dose 11/12). Currently GIB noted to be resolved.   This admission 10/23/17. Patient was started on heparin IV drip 10/31/17 bridging back to warfarin. Warfarin restarted on 11/01/17. Heparin level therapeutic at 0.4 on current rate.   INR continues to trend up and is 1.82 today. A higher dose was given yesterday which is not impacting the INR yet, I expect the INR to be at goal tomorrow or close to it. Will continue with home dosing today.  -(PTA dose = 5mg  daily except 2.5mg  MWF)  Goal of Therapy:  INR 2-3 Heparin level 0.3-0.5 units/ml (lower goal due to recent GIB) Monitor platelets by anticoagulation protocol: Yes   Plan:  Warfarin 5mg  x1 Continue heparin at 1600 units/hr Daily HL, INR, CBC (IV team to draw labs , due to difficult stick) Monitor for s/sx of bleeding   11/03/17 PharmD PGY1 Pharmacy Practice Resident 11/06/2017 9:08 AM Pager: [phone] Phone: [phone]

## 2017-11-06 NOTE — Progress Notes (Signed)
Progress Note  Patient Name: [name] Date of Encounter: 11/06/2017  Primary Cardiologist: Dr. Diona Browner, Dr. Johney Frame (EP)   Subjective   Denies dyspnea; no CP  Inpatient Medications    Scheduled Meds: . carvedilol  18.75 mg Oral BID WC  . clopidogrel  75 mg Oral Daily  . collagenase   Topical Daily  . digoxin  0.125 mg Oral Daily  . furosemide  40 mg Oral Daily  . hydrALAZINE  10 mg Oral Q8H  . insulin aspart  0-15 Units Subcutaneous TID WC  . insulin aspart  0-5 Units Subcutaneous QHS  . insulin glargine  5 Units Subcutaneous BID  . isosorbide dinitrate  10 mg Oral TID  . levothyroxine  50 mcg Oral QAC breakfast  . pantoprazole  40 mg Oral QHS  . potassium chloride  20 mEq Oral Daily  . protein supplement shake  11 oz Oral BID BM  . tamsulosin  0.4 mg Oral Daily  . warfarin  5 mg Oral ONCE-1800  . Warfarin - Pharmacist Dosing Inpatient   Does not apply q1800   Continuous Infusions: . sodium chloride Stopped (10/26/17 1346)  . heparin 1,600 Units/hr (11/05/17 2019)   PRN Meds: sodium chloride, acetaminophen (TYLENOL) oral liquid 160 mg/5 mL, camphor-menthol, sodium chloride flush   Vital Signs    Vitals:   11/05/17 1649 11/05/17 2124 11/06/17 0453 11/06/17 0529  BP: (!) 137/95 (!) 110/94 115/60 127/69  Pulse: 87 93 80 88  Resp: 18 18 18 18   Temp: 97.8 F (36.6 C) 98.2 F (36.8 C) 97.9 F (36.6 C) (!) 97.4 F (36.3 C)  TempSrc: Oral Oral  Oral  SpO2: 98% 97% 96% 100%  Weight:   242 lb 11.6 oz (110.1 kg)   Height:        Intake/Output Summary (Last 24 hours) at 11/06/2017 0935 Last data filed at 11/06/2017 0920 Gross per 24 hour  Intake 254.4 ml  Output -  Net 254.4 ml   Filed Weights   11/04/17 0500 11/05/17 0500 11/06/17 0453  Weight: 249 lb 9 oz (113.2 kg) 244 lb 11.4 oz (111 kg) 242 lb 11.6 oz (110.1 kg)     Physical Exam   General: WD, obese, in bed Head: normal Neck: supple Lungs:  CTA; no rhonchi Heart: irregular Abdomen: soft,  ND/ND Extremities: trace to 1+ edema Neuro: grossly intact  Labs    Chemistry Recent Labs  Lab 11/04/17 0325 11/05/17 0445 11/06/17 0444  NA 135 137 136  K 3.5 4.3 3.5  CL 101 101 100*  CO2 25 27 28   GLUCOSE 134* 129* 157*  BUN 38* 38* 38*  CREATININE 2.24* 2.41* 2.53*  CALCIUM 8.8* 8.7* 8.7*  GFRNONAA 27* 24* 23*  GFRAA 31* 28* 27*  ANIONGAP 9 9 8      Hematology Recent Labs  Lab 11/04/17 0325 11/05/17 0445 11/06/17 0444  WBC 6.7 6.2 5.8  RBC 3.02* 2.89* 2.86*  HGB 9.5* 8.9* 8.7*  HCT 30.4* 28.7* 28.6*  MCV 100.7* 99.3 100.0  MCH 31.5 30.8 30.4  MCHC 31.3 31.0 30.4  RDW 19.5* 19.2* 18.8*  PLT 243 224 219    Telemetry    Afib with v pacing; rate reasonably well controlled - Personally Reviewed    Cardiac Studies   Echo 10/13/17: Study Conclusions - Left ventricle: Diffuse hypokinesis worse in inferior wall and   abnormal septal motion The cavity size was moderately dilated.   Wall thickness was increased in a pattern of severe  LVH. Systolic   function was severely reduced. The estimated ejection fraction   was in the range of 25% to 30%. Doppler parameters are consistent   with abnormal left ventricular relaxation (grade 1 diastolic   dysfunction). - Mitral valve: Calcified annulus. Mildly thickened leaflets .   There was mild regurgitation. - Atrial septum: No defect or patent foramen ovale was identified.   Echo 03/03/17: Study Conclusions - Procedure narrative: Transthoracic echocardiography. Image   quality was suboptimal. The study was technically difficult, as a   result of poor sound wave transmission and body habitus. - Left ventricle: The cavity size was normal. Wall thickness was   increased in a pattern of moderate LVH. Systolic function was   normal. The estimated ejection fraction was in the range of 55%   to 60%. Images were inadequate for LV wall motion assessment. No   gross regional variation on apical 4-chamber views. Doppler    parameters are consistent with abnormal left ventricular   relaxation (grade 1 diastolic dysfunction). Doppler parameters   are consistent with high ventricular filling pressure. - Aortic valve: Mildly to moderately calcified annulus. - Mitral valve: Mildly calcified annulus. There was mild   regurgitation. - Left atrium: The atrium was moderately dilated. - Right ventricle: Pacer wire or catheter noted in right ventricle.  Patient Profile     76 y.o. male admitted with altered mental status and respiratory failure. He hasBiVAICD and PAF, elevated troponin PAF.EP interrogated device and reported he had been in Afib since approximately 09/19/17. LV function is worse compared to previous echo (echocardiogram November 28 showed ejection fraction 25-30%, grade 1 diastolic dysfunction and mild mitral regurgitation).   Assessment & Plan     1 acute on chronic systolic congestive heart failure-volume status improved; will resume home dose of demadex at DC (10 mg BID).  2 ischemic cardiomyopathy-previous echocardiogram April 2018 showed his LV function had normalized.  It is now decreased again.  Question if this may be related to atrial fibrillation with rapid ventricular response.  Could also be ischemia mediated given non-ST elevation myocardial infarction (felt to be demand ischemia). Continue carvedilol 18.75 mg twice daily and hydralazine/nitrates.  We will avoid ACE inhibitor or ARB given renal insufficiency.  3 non-ST elevation myocardial infarction-continue Plavix and beta-blocker.  He apparently is intolerant to statins.  He will need ischemia evaluation when he recovers from this hospitalization (given recent GI bleed and sepsis, would like for him to recover fully prior to proceeding with further cardiac eval). Would likely begin with nuclear study as he would be at high risk for contrast nephropathy given baseline renal insuff. Nuclear study can be scheduled as outpt.   4 persistant  atrial fibrillation-patient remains in atrial fibrillation.  Rate is reasonably well controlled. Continue coreg but increase to 18.75 mg BID; DC digoxin given renal insufficiency.  INR 1.82 and increasing; can DC heparin as pt would like DC. Check INR Wed and Bmet at that time as well.   5 status post ICD  6 peripheral vascular disease and possible cellulitis-management per primary care.    7 chronic stage III kidney disease-follow renal function closely as outpt  Will arrange TOC appt one week and fu with Dr Diona Browner 8-12 weeks    Olga Millers, MD

## 2017-11-08 ENCOUNTER — Other Ambulatory Visit: Payer: Self-pay | Admitting: *Deleted

## 2017-11-08 ENCOUNTER — Telehealth: Payer: Self-pay | Admitting: Cardiology

## 2017-11-08 DIAGNOSIS — Z794 Long term (current) use of insulin: Secondary | ICD-10-CM | POA: Diagnosis not present

## 2017-11-08 DIAGNOSIS — L89322 Pressure ulcer of left buttock, stage 2: Secondary | ICD-10-CM | POA: Diagnosis not present

## 2017-11-08 DIAGNOSIS — R6520 Severe sepsis without septic shock: Secondary | ICD-10-CM | POA: Diagnosis not present

## 2017-11-08 DIAGNOSIS — I21A1 Myocardial infarction type 2: Secondary | ICD-10-CM | POA: Diagnosis not present

## 2017-11-08 DIAGNOSIS — I2510 Atherosclerotic heart disease of native coronary artery without angina pectoris: Secondary | ICD-10-CM | POA: Diagnosis not present

## 2017-11-08 DIAGNOSIS — Z5181 Encounter for therapeutic drug level monitoring: Secondary | ICD-10-CM | POA: Diagnosis not present

## 2017-11-08 DIAGNOSIS — N390 Urinary tract infection, site not specified: Secondary | ICD-10-CM | POA: Diagnosis not present

## 2017-11-08 DIAGNOSIS — M069 Rheumatoid arthritis, unspecified: Secondary | ICD-10-CM | POA: Diagnosis not present

## 2017-11-08 DIAGNOSIS — I255 Ischemic cardiomyopathy: Secondary | ICD-10-CM | POA: Diagnosis not present

## 2017-11-08 DIAGNOSIS — I480 Paroxysmal atrial fibrillation: Secondary | ICD-10-CM | POA: Diagnosis not present

## 2017-11-08 DIAGNOSIS — E785 Hyperlipidemia, unspecified: Secondary | ICD-10-CM | POA: Diagnosis not present

## 2017-11-08 DIAGNOSIS — Z4800 Encounter for change or removal of nonsurgical wound dressing: Secondary | ICD-10-CM | POA: Diagnosis not present

## 2017-11-08 DIAGNOSIS — L03116 Cellulitis of left lower limb: Secondary | ICD-10-CM | POA: Diagnosis not present

## 2017-11-08 DIAGNOSIS — Z7902 Long term (current) use of antithrombotics/antiplatelets: Secondary | ICD-10-CM | POA: Diagnosis not present

## 2017-11-08 DIAGNOSIS — I509 Heart failure, unspecified: Secondary | ICD-10-CM | POA: Diagnosis not present

## 2017-11-08 DIAGNOSIS — Z7901 Long term (current) use of anticoagulants: Secondary | ICD-10-CM | POA: Diagnosis not present

## 2017-11-08 DIAGNOSIS — Z79891 Long term (current) use of opiate analgesic: Secondary | ICD-10-CM | POA: Diagnosis not present

## 2017-11-08 DIAGNOSIS — E1151 Type 2 diabetes mellitus with diabetic peripheral angiopathy without gangrene: Secondary | ICD-10-CM | POA: Diagnosis not present

## 2017-11-08 NOTE — Telephone Encounter (Signed)
Left message to return call 

## 2017-11-08 NOTE — Progress Notes (Signed)
No ICM remote transmission received for 11/02/2017 and next ICM transmission scheduled for 11/25/2017.

## 2017-11-08 NOTE — Patient Outreach (Signed)
Referral received from hospital liason 11/08/17, pt hospitalized 12/8-12/22/18 with septic shock with unclear origin (UTI, wound infection related to LLE cellulitis).  Telephone call to pt for transition of care week 1, spoke with pt, HIPAA verified, pt gave permission to speak with wife Bonita Quin,  RN CM spoke with wife Bonita Quin who reports pt is to see primary MD Wednesday and will have blood work completed then, home health is seeing pt and monitoring diabetes, CBG , CHF, weight, wound to LLE, wife is assisting with wound care.  Weight is 242 pounds and wife verbalizes CHF action plan.  Wife, pt agreeable to weekly transition of care calls and home visit this week.  RN CM faxed transition of care note to primary MD Dr. Sherryll Burger.  Outpatient Encounter Medications as of 11/08/2017  Medication Sig  . acetaminophen (TYLENOL 8 HOUR ARTHRITIS PAIN) 650 MG CR tablet Take 1 tablet by mouth daily as needed for pain.  . carvedilol (COREG) 6.25 MG tablet Take 3 tablets (18.75 mg total) by mouth 2 (two) times daily with a meal.  . Cholecalciferol (VITAMIN D3) 10000 units TABS Take 1,000 Units by mouth daily.  . clopidogrel (PLAVIX) 75 MG tablet Take 1 tablet (75 mg total) by mouth daily.  . collagenase (SANTYL) ointment Apply 1 application topically daily. Apply to the affected area daily plus dry dressing  . hydrALAZINE (APRESOLINE) 10 MG tablet Take 1 tablet (10 mg total) by mouth every 8 (eight) hours.  Marland Kitchen HYDROcodone-acetaminophen (NORCO) 7.5-325 MG tablet Take 1 tablet by mouth every 6 (six) hours as needed for moderate pain.  Marland Kitchen insulin lispro protamine-lispro (HUMALOG 75/25 MIX) (75-25) 100 UNIT/ML SUSP injection Inject 10 Units into the skin 2 (two) times daily with a meal. Take 10units BID with meals (Patient taking differently: Inject 10 Units into the skin 2 (two) times daily with a meal. )  . isosorbide dinitrate (ISORDIL) 10 MG tablet Take 1 tablet (10 mg total) by mouth 3 (three) times daily.  Marland Kitchen  l-methylfolate-B6-B12 (METANX) 3-35-2 MG TABS tablet Take 1 tablet by mouth daily.  Marland Kitchen levothyroxine (SYNTHROID, LEVOTHROID) 50 MCG tablet Take 50 mcg by mouth daily.  . nitroGLYCERIN (NITROSTAT) 0.4 MG SL tablet Place 0.4 mg under the tongue every 5 (five) minutes as needed for chest pain. For chest pain  . pantoprazole (PROTONIX) 40 MG tablet Take 1 tablet (40 mg total) by mouth 2 (two) times daily before a meal.  . potassium chloride SA (K-DUR,KLOR-CON) 20 MEQ tablet Take 1 tablet (20 mEq total) by mouth daily.  . protein supplement shake (PREMIER PROTEIN) LIQD Take 325 mLs (11 oz total) by mouth 2 (two) times daily between meals.  . tamsulosin (FLOMAX) 0.4 MG CAPS capsule Take 0.4 mg by mouth daily as needed (bladder).   . torsemide (DEMADEX) 10 MG tablet Take 1 tablet (10 mg total) by mouth 2 (two) times daily. (Patient taking differently: Take 10 mg by mouth daily. May take additional 10mg  by mouth in evening as needed for fluid)  . warfarin (COUMADIN) 2.5 MG tablet Take 2.5-5 mg by mouth See admin instructions. Pt takes 2.5mg  by mouth once daily on Mon, Wed, Fri - takes 5mg  once daily Tues, Thurs, Sat, Sun  . Zinc 50 MG CAPS Take 50 mg by mouth every evening.    No facility-administered encounter medications on file as of 11/08/2017.     [facility] CM Care Plan Problem One     Most Recent Value  Care Plan Problem One  Knowledge deficit  related to diabetes  Role Documenting the Problem One  Care Management Coordinator  Care Plan for Problem One  Active  [facility]N Long Term Goal   Pt will verbalize better understanding of diabetes to acheive better outcomes within 60 days  THN Long Term Goal Start Date  11/08/17  Interventions for Problem One Long Term Goal  RN CM reviewed CBG log with wife who reports CBG readings 140-150's range fasting and checks BID, home health is teaching, monitoring diabetes per wife  THN CM Short Term Goal #1   Pt, wife will verbalize correlation between diabetes/ hyperglycemia  and infection (UTI, wound infection)  THN CM Short Term Goal #1 Start Date  11/08/17  Interventions for Short Term Goal #1  RN CM reviewed correlation of elevated CBG and increase risk of infection and slow wound healing      PLAN See pt for initial home visit this week  Irving Shows [facility], BSN [facility] Care Coordinator [phone]

## 2017-11-08 NOTE — Telephone Encounter (Signed)
Janetta Hora, PA-C  Slaughter, Vicky T        Can I get a 1 week TOC for CHF. He is a [facility] pt.   Per note -will need phone call

## 2017-11-10 ENCOUNTER — Encounter: Payer: Self-pay | Admitting: Licensed Clinical Social Worker

## 2017-11-10 ENCOUNTER — Other Ambulatory Visit: Payer: Self-pay | Admitting: Licensed Clinical Social Worker

## 2017-11-10 ENCOUNTER — Ambulatory Visit: Payer: PPO | Admitting: Surgery

## 2017-11-10 NOTE — Patient Outreach (Signed)
Assessment:  CSW received referral on [name]. CSW completed chart review on [name] on 11/10/17. Client sees Dr. [name] as primary care doctor. Client has support from his spouse, [name].  Client was recently hospitalized and declined skilled nursing home placement. He returned to his home after hospitalization. He has support as ordered from home health agency. Client is receiving home health nursing support and home health physical therapy support with [facility].  CSW spoke via phone with client on 11/10/17. CSW verified client identity. CSW received verbal permission from client on 11/10/17 for CSW to speak with client and to speak with [name], spouse of client, regarding client needs and status. Client is beginning to receive in home services with [facility]. The Home Health Nurse with [facility] visited client in client's home on 11/10/17.  CSW also spoke with [name], spouse of client, regarding client needs. [name] said that physical therapist with [facility] would be conducting home visit with client later this week.  [name] and CSW completed [facility] assessments for client.  CSW and [name] spoke of client care plan. CSW encouraged that client participate in all scheduled client in home physical therapy sessions for client in next 30 days.Client has [employer]. Client uses a walker to assist client in ambulation.  [name] said that she had spoken recently via phone with RN [name] regarding client needs.  CSW discussed with [name] [facility] support related to pharmacy, nursing and social work.  CSW thanked client and [name] for phone call with CSW on 11/10/17. CSW encouraged that client or [name] call CSW at [phone] as needed to discuss social work needs of client.  Plan:  Client to participate in all scheduled client in home physical therapy sessions for client in next 30 days  CSW to  collaborate with RN [name] in monitoring needs of client.   CSW to call client or [name] in 3 weeks to assess client needs at that time.  [name].[name] MSW, LCSW Licensed Clinical Social Worker [facility] Care Management [phone]

## 2017-11-10 NOTE — Telephone Encounter (Signed)
Patient contacted regarding discharge from [facility] on 11/06/2017.    Patient understands to follow up with Dr. Prentice Docker on Friday, 11/12/2017 at 9:40 in the [place] office.   Patient understands discharge instructions? Yes  Patient understands medications and regiment? Yes  Patient understands to bring all medications to this visit? Yes   Patient not feeling well, c/o giving out of breath with walking across living room at home.  Discussed discharge plan with patient - stated that he will contact his pmd for his INR check.  Dr. Purvis Sheffield agreed to see patient as there was no availability with Dr. Diona Browner or extenders available this week.

## 2017-11-11 DIAGNOSIS — E11621 Type 2 diabetes mellitus with foot ulcer: Secondary | ICD-10-CM | POA: Diagnosis not present

## 2017-11-12 ENCOUNTER — Encounter: Payer: Self-pay | Admitting: Cardiovascular Disease

## 2017-11-12 ENCOUNTER — Encounter: Payer: Self-pay | Admitting: *Deleted

## 2017-11-12 ENCOUNTER — Other Ambulatory Visit: Payer: Self-pay | Admitting: *Deleted

## 2017-11-12 ENCOUNTER — Ambulatory Visit: Payer: PPO | Admitting: Cardiovascular Disease

## 2017-11-12 VITALS — BP 110/59 | HR 67 | Ht 68.0 in | Wt 244.0 lb

## 2017-11-12 DIAGNOSIS — Z9289 Personal history of other medical treatment: Secondary | ICD-10-CM | POA: Diagnosis not present

## 2017-11-12 DIAGNOSIS — I5022 Chronic systolic (congestive) heart failure: Secondary | ICD-10-CM | POA: Diagnosis not present

## 2017-11-12 DIAGNOSIS — I25118 Atherosclerotic heart disease of native coronary artery with other forms of angina pectoris: Secondary | ICD-10-CM

## 2017-11-12 DIAGNOSIS — Z955 Presence of coronary angioplasty implant and graft: Secondary | ICD-10-CM

## 2017-11-12 DIAGNOSIS — Z95810 Presence of automatic (implantable) cardiac defibrillator: Secondary | ICD-10-CM | POA: Diagnosis not present

## 2017-11-12 DIAGNOSIS — I428 Other cardiomyopathies: Secondary | ICD-10-CM

## 2017-11-12 DIAGNOSIS — I214 Non-ST elevation (NSTEMI) myocardial infarction: Secondary | ICD-10-CM

## 2017-11-12 DIAGNOSIS — I4819 Other persistent atrial fibrillation: Secondary | ICD-10-CM

## 2017-11-12 DIAGNOSIS — I481 Persistent atrial fibrillation: Secondary | ICD-10-CM

## 2017-11-12 DIAGNOSIS — I10 Essential (primary) hypertension: Secondary | ICD-10-CM | POA: Diagnosis not present

## 2017-11-12 NOTE — Progress Notes (Signed)
SUBJECTIVE: The patient presents for a transition of care appointment.  He is a patient of Dr. Diona Browner.  He was recently hospitalized for acute on chronic systolic congestive heart failure.  His left ventricular systolic function which had previously been normal in April 2018 (LVEF 55-60%) was found to be severely reduced by echocardiogram on 10/13/17, LVEF now 25-30%.  There was grade 1 diastolic dysfunction, moderate left ventricular dilatation, and severe LVH.  There was some thought as to whether or not the decrease in LVEF was related to rapid atrial fibrillation versus an ischemic etiology.  An outpatient nuclear stress test was recommended.  EP interrogated the patient's BiV AICD and it appears he had been in atrial fibrillation since approximately 09/19/17.  He is here with his wife.  He was recently discharged.  His wife says he is anxious about recovering quickly but she has been instructing him that he should be more patient.  He has been doing well since he got home.  He has been trying to walk around the house some but has been napping frequently which has disturbed his nighttime sleep.  He does have intermittent exertional dyspnea.  He denies orthopnea and paroxysmal nocturnal dyspnea.  He continues to have leg and feet swelling which is markedly improved.  Leg swelling does resolve when he props his feet up.  He denies chest pain.   Review of Systems: As per "subjective", otherwise negative.  Allergies  Allergen Reactions  . Allegra [Fexofenadine] Itching  . Crestor [Rosuvastatin Calcium] Other (See Comments)    Body aches  . Codeine Other (See Comments)    blisters, but able to take hydrocodone    Current Outpatient Medications  Medication Sig Dispense Refill  . acetaminophen (TYLENOL 8 HOUR ARTHRITIS PAIN) 650 MG CR tablet Take 1 tablet by mouth daily as needed for pain.    . carvedilol (COREG) 6.25 MG tablet Take 3 tablets (18.75 mg total) by mouth 2 (two) times  daily with a meal. 180 tablet 0  . Cholecalciferol (VITAMIN D3) 10000 units TABS Take 1,000 Units by mouth daily.    . clopidogrel (PLAVIX) 75 MG tablet Take 1 tablet (75 mg total) by mouth daily. 30 tablet 6  . collagenase (SANTYL) ointment Apply 1 application topically daily. Apply to the affected area daily plus dry dressing 90 g 3  . hydrALAZINE (APRESOLINE) 10 MG tablet Take 1 tablet (10 mg total) by mouth every 8 (eight) hours. 90 tablet 0  . HYDROcodone-acetaminophen (NORCO) 7.5-325 MG tablet Take 1 tablet by mouth every 6 (six) hours as needed for moderate pain. 10 tablet 0  . insulin lispro protamine-lispro (HUMALOG 75/25 MIX) (75-25) 100 UNIT/ML SUSP injection Inject 10 Units into the skin 2 (two) times daily with a meal. Take 10units BID with meals (Patient taking differently: Inject 10 Units into the skin 2 (two) times daily with a meal. )    . isosorbide dinitrate (ISORDIL) 10 MG tablet Take 1 tablet (10 mg total) by mouth 3 (three) times daily. 180 tablet 0  . l-methylfolate-B6-B12 (METANX) 3-35-2 MG TABS tablet Take 1 tablet by mouth daily.    Marland Kitchen levothyroxine (SYNTHROID, LEVOTHROID) 50 MCG tablet Take 50 mcg by mouth daily.    . nitroGLYCERIN (NITROSTAT) 0.4 MG SL tablet Place 0.4 mg under the tongue every 5 (five) minutes as needed for chest pain. For chest pain    . pantoprazole (PROTONIX) 40 MG tablet Take 1 tablet (40 mg total) by mouth 2 (  two) times daily before a meal. 60 tablet 0  . potassium chloride SA (K-DUR,KLOR-CON) 20 MEQ tablet Take 1 tablet (20 mEq total) by mouth daily. 30 tablet 0  . protein supplement shake (PREMIER PROTEIN) LIQD Take 325 mLs (11 oz total) by mouth 2 (two) times daily between meals.  0  . tamsulosin (FLOMAX) 0.4 MG CAPS capsule Take 0.4 mg by mouth daily as needed (bladder).     . torsemide (DEMADEX) 10 MG tablet Take 1 tablet (10 mg total) by mouth 2 (two) times daily. (Patient taking differently: Take 10 mg by mouth daily. May take additional 10mg  by  mouth in evening as needed for fluid) 60 tablet 3  . warfarin (COUMADIN) 2.5 MG tablet Take 2.5-5 mg by mouth See admin instructions. Pt takes 2.5mg  by mouth once daily on Mon, Wed, Fri - takes 5mg  once daily Tues, Thurs, Sat, Sun    . Zinc 50 MG CAPS Take 50 mg by mouth every evening.      No current facility-administered medications for this visit.     Past Medical History:  Diagnosis Date  . AICD (automatic cardioverter/defibrillator) present   . Anemia   . Anemia-chronic    a. Pt reports h/o anemia - was offered IV iron in past by nephrologist but declined and has taken PO instead.  . Aneurysm, cerebral   . Arthritis    bilateral wrist and fingers  . BPH (benign prostatic hypertrophy)   . Cardiomyopathy, ischemic    LVEF 25-35%.  . Chronic kidney disease, stage III (moderate) (HCC)    Followed by Dr. 08-01-1996.  . Coronary atherosclerosis of native coronary artery    a. DES to RCA 03/2009. b. s/p PTCA to RCA for ISR, 05/2010. c. 03/2013 NSTEMI 2/2 severe prox RCA s/p PTCA/DES, med rx for residual dz.  . Cystitis 10/2017  . Essential hypertension, benign   . Hyperlipidemia   . Hypothyroidism   . LBBB (left bundle branch block)    s/p BiV ICD Implanted by Dr 04/2013  . Morbid obesity (HCC)   . MRSA (methicillin resistant Staphylococcus aureus)   . Myocardial infarction (HCC)   . Paroxysmal atrial fibrillation (HCC)    a. Coumadin discontinue in 2010 when the patient required ASA/Plavix for stenting. b. Coumadin restarted 05/2013, Plavix continued, ASA stopped.   . Pneumonia    years ago 90  . Pulmonary nodule    CXR, 03/2013, [facility] - not described on any subsequent chest x-rays however, could be a vascular structure  . TIA (transient ischemic attack)    Multiple TIAs  . Type 2 diabetes mellitus (HCC)     Past Surgical History:  Procedure Laterality Date  . BIV ICD GENERATOR CHANGEOUT N/A 03/18/2017   Medtronic [place] MRI conditional CRT D device implated by Dr 04/2013  .  CHOLECYSTECTOMY    . ESOPHAGOGASTRODUODENOSCOPY N/A 10/06/2017   Procedure: ESOPHAGOGASTRODUODENOSCOPY (EGD);  Surgeon: Johney Frame, MD;  Location: 10/08/2017 ENDOSCOPY;  Service: Gastroenterology;  Laterality: N/A;  . ESOPHAGOGASTRODUODENOSCOPY N/A 10/07/2017   Procedure: ESOPHAGOGASTRODUODENOSCOPY (EGD);  Surgeon: Lucien Mons, MD;  Location: 10/09/2017 ENDOSCOPY;  Service: Gastroenterology;  Laterality: N/A;  . ESOPHAGOGASTRODUODENOSCOPY (EGD) WITH PROPOFOL N/A 10/05/2017   Procedure: ESOPHAGOGASTRODUODENOSCOPY (EGD) WITH PROPOFOL;  Surgeon: Lucien Mons, MD;  Location: [facility] ENDO SU[facility];  Service: Endoscopy;  Laterality: N/A;  has ICD, just FYI  . ICD placement  05/06/2011   Medtronic CRT-D  . LEFT HEART CATHETERIZATION WITH CORONARY ANGIOGRAM N/A 04/17/2013   Procedure: LEFT HEART CATHETERIZATION  WITH CORONARY ANGIOGRAM;  Surgeon: Vesta Mixer, MD;  Location: [facility] CATH LAB;  Service: Cardiovascular;  Laterality: N/A;  . LOWER EXTREMITY ANGIOGRAPHY Left 10/12/2017   Procedure: Lower Extremity Angiography;  Surgeon: Nada Libman, MD;  Location: [facility] INVASIVE CV LAB;  Service: Cardiovascular;  Laterality: Left;  . LUNG SURGERY     24 - 25 yrs. old  . PERIPHERAL VASCULAR ATHERECTOMY Left 10/12/2017   Procedure: PERIPHERAL VASCULAR ATHERECTOMY;  Surgeon: Nada Libman, MD;  Location: [facility] INVASIVE CV LAB;  Service: Cardiovascular;  Laterality: Left;  SFA/POPLITEAL  . TOTAL KNEE ARTHROPLASTY    . VASECTOMY      Social History   Socioeconomic History  . Marital status: Married    Spouse name: Not on file  . Number of children: Not on file  . Years of education: Not on file  . Highest education level: Not on file  Social Needs  . Financial resource strain: Not on file  . Food insecurity - worry: Not on file  . Food insecurity - inability: Not on file  . Transportation needs - medical: Not on file  . Transportation needs - non-medical: Not on file  Occupational History  . Not on file  Tobacco  Use  . Smoking status: Former Smoker    Packs/day: 1.00    Years: 6.00    Pack years: 6.00    Types: Cigarettes    Start date: 12/08/1949    Last attempt to quit: 11/16/1964    Years since quitting: 53.0  . Smokeless tobacco: Former Neurosurgeon    Quit date: 04/15/1965  Substance and Sexual Activity  . Alcohol use: Yes    Alcohol/week: 0.0 oz    Comment: once/month  . Drug use: No  . Sexual activity: Not on file  Other Topics Concern  . Not on file  Social History Narrative  . Not on file     Vitals:   11/12/17 0950  BP: (!) 110/59  Pulse: 67  SpO2: 97%  Weight: 244 lb (110.7 kg)  Height: 5\' 8"  (1.727 m)    Wt Readings from Last 3 Encounters:  11/12/17 244 lb (110.7 kg)  11/06/17 242 lb 11.6 oz (110.1 kg)  10/11/17 262 lb 5.6 oz (119 kg)     PHYSICAL EXAM General: NAD HEENT: Normal. Neck: No JVD, no thyromegaly. Lungs: Clear to auscultation bilaterally with normal respiratory effort. CV: Regular rate and irregular rhythm, normal S1/S2, no S3, no murmur. 1+ pitting bilateral pretibial and peri-ankle edema.     Abdomen: Soft, nontender, protuberant.  Neurologic: Alert and oriented.  Psych: Normal affect. Skin: Normal. Musculoskeletal: No gross deformities.    ECG: Most recent ECG reviewed.   Labs: Lab Results  Component Value Date/Time   K 3.5 11/06/2017 04:44 AM   BUN 38 (H) 11/06/2017 04:44 AM   CREATININE 2.53 (H) 11/06/2017 04:44 AM   ALT 295 (H) 10/27/2017 10:35 AM   TSH 8.598 (H) 10/29/2017 02:08 AM   TSH 3.996 04/15/2013 01:50 AM   HGB 8.7 (L) 11/06/2017 04:44 AM     Lipids: Lab Results  Component Value Date/Time   LDLCALC 89 06/14/2013 11:41 PM   LDLDIRECT 110.0 12/28/2007 09:13 AM   CHOL 152 06/14/2013 11:41 PM   TRIG 221 (H) 10/14/2017 04:36 AM   HDL 20 (L) 06/14/2013 11:41 PM       ASSESSMENT AND PLAN: 1.  Chronic systolic heart failure: Symptomatically stable overall.  I told both the patient and his wife that  he should expect continued  improvement in symptoms over time.  I will make a referral to cardiac rehabilitation.  I instructed him to take an extra 10 mg of torsemide should weight increased by 3 pounds in 24 hours.   With significant decline in left ventricular systolic function, an evaluation for an ischemic etiology is warranted.  I will arrange for a Lexiscan Myoview stress test.  Continue carvedilol 18.75 milligrams twice daily and hydralazine 10 mg 3 times daily along with nitrates.  Not a candidate for ACE inhibitors or angiotensin receptor blockers due to advanced chronic kidney disease.  Continue torsemide 10 mg twice daily.  2.  Cardiomyopathy: As stated above, there is a question if this is related to rapid atrial fibrillation versus an ischemic etiology.  I will arrange for a Lexiscan Myoview stress test to evaluate for an ischemic etiology.  Continue carvedilol.  3.  Non-STEMI with known coronary artery disease and multiple prior RCA interventions: I will arrange for a Lexiscan Myoview stress test to evaluate for a significant ischemic burden.  He is statin intolerant.  Continue Plavix and beta-blocker.  4.  Persistent atrial fibrillation: Symptomatically stable.  Currently on carvedilol for rate control.  Anticoagulated with warfarin.  Left atrium is normal in size.  5. BiV AICD: Device interrogated during hospitalization.  6.  Hypertension: Blood pressure is normal.  No changes to therapy.    Disposition: Follow up 1 month with Dr. Nolon Lennert, M.D., F.A.C.C.

## 2017-11-12 NOTE — Patient Instructions (Signed)
Your physician recommends that you schedule a follow-up appointment in: 1 month with Dr.McDowell   Your physician has requested that you have a lexiscan myoview. For further information please visit [email or web address] Please follow instruction sheet, as given.   You have been referred to Cardiac Rehab at [facility] will call you to set up. [phone]    Your physician recommends that you continue on your current medications as directed. Please refer to the Current Medication list given to you today.    If you need a refill on your cardiac medications before your next appointment, please call your pharmacy.     No lab work ordered today

## 2017-11-12 NOTE — Patient Outreach (Signed)
RN CM drove to patient's home for initial home visit, RN CM introduced herself and patient's wife declined today's visit and states with pt in agreement " we don't need or want this program, if anything changes in the future we'll let someone know"  RN CM once again explained [facility] program and pt and wife still decline and do not want RN CM or CSW services.  RN CM called CSW Lorna Few and informed that pt requests no CSW services.  RN CM sent in basket to CMA requesting case closure, faxed case closure letter to primary MD Dr. [name].  Case closed  Irving [facility] [employer] [facility], BSN [facility] Care Coordinator [phone]

## 2017-11-16 DIAGNOSIS — L03116 Cellulitis of left lower limb: Secondary | ICD-10-CM | POA: Diagnosis not present

## 2017-11-16 DIAGNOSIS — Z5181 Encounter for therapeutic drug level monitoring: Secondary | ICD-10-CM | POA: Diagnosis not present

## 2017-11-16 DIAGNOSIS — Z794 Long term (current) use of insulin: Secondary | ICD-10-CM | POA: Diagnosis not present

## 2017-11-16 DIAGNOSIS — Z7902 Long term (current) use of antithrombotics/antiplatelets: Secondary | ICD-10-CM | POA: Diagnosis not present

## 2017-11-16 DIAGNOSIS — I480 Paroxysmal atrial fibrillation: Secondary | ICD-10-CM | POA: Diagnosis not present

## 2017-11-16 DIAGNOSIS — I21A1 Myocardial infarction type 2: Secondary | ICD-10-CM | POA: Diagnosis not present

## 2017-11-16 DIAGNOSIS — Z79891 Long term (current) use of opiate analgesic: Secondary | ICD-10-CM | POA: Diagnosis not present

## 2017-11-16 DIAGNOSIS — Z4800 Encounter for change or removal of nonsurgical wound dressing: Secondary | ICD-10-CM | POA: Diagnosis not present

## 2017-11-16 DIAGNOSIS — E1151 Type 2 diabetes mellitus with diabetic peripheral angiopathy without gangrene: Secondary | ICD-10-CM | POA: Diagnosis not present

## 2017-11-16 DIAGNOSIS — N390 Urinary tract infection, site not specified: Secondary | ICD-10-CM | POA: Diagnosis not present

## 2017-11-16 DIAGNOSIS — I2510 Atherosclerotic heart disease of native coronary artery without angina pectoris: Secondary | ICD-10-CM | POA: Diagnosis not present

## 2017-11-16 DIAGNOSIS — I255 Ischemic cardiomyopathy: Secondary | ICD-10-CM | POA: Diagnosis not present

## 2017-11-16 DIAGNOSIS — L89322 Pressure ulcer of left buttock, stage 2: Secondary | ICD-10-CM | POA: Diagnosis not present

## 2017-11-16 DIAGNOSIS — I509 Heart failure, unspecified: Secondary | ICD-10-CM | POA: Diagnosis not present

## 2017-11-16 DIAGNOSIS — E785 Hyperlipidemia, unspecified: Secondary | ICD-10-CM | POA: Diagnosis not present

## 2017-11-16 DIAGNOSIS — R6520 Severe sepsis without septic shock: Secondary | ICD-10-CM | POA: Diagnosis not present

## 2017-11-16 DIAGNOSIS — Z7901 Long term (current) use of anticoagulants: Secondary | ICD-10-CM | POA: Diagnosis not present

## 2017-11-16 DIAGNOSIS — M069 Rheumatoid arthritis, unspecified: Secondary | ICD-10-CM | POA: Diagnosis not present

## 2017-11-17 DIAGNOSIS — E1151 Type 2 diabetes mellitus with diabetic peripheral angiopathy without gangrene: Secondary | ICD-10-CM | POA: Diagnosis not present

## 2017-11-17 DIAGNOSIS — Z794 Long term (current) use of insulin: Secondary | ICD-10-CM | POA: Diagnosis not present

## 2017-11-17 DIAGNOSIS — I2510 Atherosclerotic heart disease of native coronary artery without angina pectoris: Secondary | ICD-10-CM | POA: Diagnosis not present

## 2017-11-17 DIAGNOSIS — R6520 Severe sepsis without septic shock: Secondary | ICD-10-CM | POA: Diagnosis not present

## 2017-11-17 DIAGNOSIS — M069 Rheumatoid arthritis, unspecified: Secondary | ICD-10-CM | POA: Diagnosis not present

## 2017-11-17 DIAGNOSIS — L89322 Pressure ulcer of left buttock, stage 2: Secondary | ICD-10-CM | POA: Diagnosis not present

## 2017-11-17 DIAGNOSIS — Z7902 Long term (current) use of antithrombotics/antiplatelets: Secondary | ICD-10-CM | POA: Diagnosis not present

## 2017-11-17 DIAGNOSIS — Z79891 Long term (current) use of opiate analgesic: Secondary | ICD-10-CM | POA: Diagnosis not present

## 2017-11-17 DIAGNOSIS — I480 Paroxysmal atrial fibrillation: Secondary | ICD-10-CM | POA: Diagnosis not present

## 2017-11-17 DIAGNOSIS — I21A1 Myocardial infarction type 2: Secondary | ICD-10-CM | POA: Diagnosis not present

## 2017-11-17 DIAGNOSIS — I255 Ischemic cardiomyopathy: Secondary | ICD-10-CM | POA: Diagnosis not present

## 2017-11-17 DIAGNOSIS — E785 Hyperlipidemia, unspecified: Secondary | ICD-10-CM | POA: Diagnosis not present

## 2017-11-17 DIAGNOSIS — Z4800 Encounter for change or removal of nonsurgical wound dressing: Secondary | ICD-10-CM | POA: Diagnosis not present

## 2017-11-17 DIAGNOSIS — Z5181 Encounter for therapeutic drug level monitoring: Secondary | ICD-10-CM | POA: Diagnosis not present

## 2017-11-17 DIAGNOSIS — Z7901 Long term (current) use of anticoagulants: Secondary | ICD-10-CM | POA: Diagnosis not present

## 2017-11-17 DIAGNOSIS — I509 Heart failure, unspecified: Secondary | ICD-10-CM | POA: Diagnosis not present

## 2017-11-17 DIAGNOSIS — L03116 Cellulitis of left lower limb: Secondary | ICD-10-CM | POA: Diagnosis not present

## 2017-11-17 DIAGNOSIS — N390 Urinary tract infection, site not specified: Secondary | ICD-10-CM | POA: Diagnosis not present

## 2017-11-18 ENCOUNTER — Telehealth: Payer: Self-pay

## 2017-11-18 ENCOUNTER — Inpatient Hospital Stay (HOSPITAL_COMMUNITY)
Admission: EM | Admit: 2017-11-18 | Discharge: 2017-12-17 | DRG: 291 | Disposition: E | Payer: PPO | Attending: Internal Medicine | Admitting: Internal Medicine

## 2017-11-18 ENCOUNTER — Encounter (HOSPITAL_COMMUNITY): Payer: Self-pay | Admitting: Emergency Medicine

## 2017-11-18 ENCOUNTER — Emergency Department (HOSPITAL_COMMUNITY): Payer: PPO

## 2017-11-18 DIAGNOSIS — E785 Hyperlipidemia, unspecified: Secondary | ICD-10-CM | POA: Diagnosis present

## 2017-11-18 DIAGNOSIS — N179 Acute kidney failure, unspecified: Secondary | ICD-10-CM

## 2017-11-18 DIAGNOSIS — I4819 Other persistent atrial fibrillation: Secondary | ICD-10-CM

## 2017-11-18 DIAGNOSIS — K7200 Acute and subacute hepatic failure without coma: Secondary | ICD-10-CM | POA: Diagnosis present

## 2017-11-18 DIAGNOSIS — K922 Gastrointestinal hemorrhage, unspecified: Secondary | ICD-10-CM | POA: Diagnosis not present

## 2017-11-18 DIAGNOSIS — R0989 Other specified symptoms and signs involving the circulatory and respiratory systems: Secondary | ICD-10-CM | POA: Diagnosis not present

## 2017-11-18 DIAGNOSIS — Z794 Long term (current) use of insulin: Secondary | ICD-10-CM

## 2017-11-18 DIAGNOSIS — Z7902 Long term (current) use of antithrombotics/antiplatelets: Secondary | ICD-10-CM

## 2017-11-18 DIAGNOSIS — I480 Paroxysmal atrial fibrillation: Secondary | ICD-10-CM | POA: Diagnosis present

## 2017-11-18 DIAGNOSIS — Z8673 Personal history of transient ischemic attack (TIA), and cerebral infarction without residual deficits: Secondary | ICD-10-CM

## 2017-11-18 DIAGNOSIS — Z6838 Body mass index (BMI) 38.0-38.9, adult: Secondary | ICD-10-CM

## 2017-11-18 DIAGNOSIS — I469 Cardiac arrest, cause unspecified: Secondary | ICD-10-CM

## 2017-11-18 DIAGNOSIS — Z833 Family history of diabetes mellitus: Secondary | ICD-10-CM

## 2017-11-18 DIAGNOSIS — I4891 Unspecified atrial fibrillation: Secondary | ICD-10-CM

## 2017-11-18 DIAGNOSIS — E1122 Type 2 diabetes mellitus with diabetic chronic kidney disease: Secondary | ICD-10-CM | POA: Diagnosis present

## 2017-11-18 DIAGNOSIS — I5043 Acute on chronic combined systolic (congestive) and diastolic (congestive) heart failure: Secondary | ICD-10-CM

## 2017-11-18 DIAGNOSIS — J9601 Acute respiratory failure with hypoxia: Secondary | ICD-10-CM

## 2017-11-18 DIAGNOSIS — R0602 Shortness of breath: Secondary | ICD-10-CM | POA: Diagnosis not present

## 2017-11-18 DIAGNOSIS — Z7901 Long term (current) use of anticoagulants: Secondary | ICD-10-CM | POA: Diagnosis not present

## 2017-11-18 DIAGNOSIS — N5089 Other specified disorders of the male genital organs: Secondary | ICD-10-CM | POA: Diagnosis present

## 2017-11-18 DIAGNOSIS — Z7189 Other specified counseling: Secondary | ICD-10-CM | POA: Diagnosis not present

## 2017-11-18 DIAGNOSIS — Z79890 Hormone replacement therapy (postmenopausal): Secondary | ICD-10-CM

## 2017-11-18 DIAGNOSIS — N400 Enlarged prostate without lower urinary tract symptoms: Secondary | ICD-10-CM | POA: Diagnosis present

## 2017-11-18 DIAGNOSIS — E871 Hypo-osmolality and hyponatremia: Secondary | ICD-10-CM | POA: Diagnosis present

## 2017-11-18 DIAGNOSIS — R791 Abnormal coagulation profile: Secondary | ICD-10-CM | POA: Diagnosis present

## 2017-11-18 DIAGNOSIS — N189 Chronic kidney disease, unspecified: Secondary | ICD-10-CM

## 2017-11-18 DIAGNOSIS — I509 Heart failure, unspecified: Secondary | ICD-10-CM | POA: Diagnosis not present

## 2017-11-18 DIAGNOSIS — E875 Hyperkalemia: Secondary | ICD-10-CM

## 2017-11-18 DIAGNOSIS — K921 Melena: Secondary | ICD-10-CM | POA: Diagnosis not present

## 2017-11-18 DIAGNOSIS — N170 Acute kidney failure with tubular necrosis: Secondary | ICD-10-CM | POA: Diagnosis present

## 2017-11-18 DIAGNOSIS — Z66 Do not resuscitate: Secondary | ICD-10-CM | POA: Diagnosis present

## 2017-11-18 DIAGNOSIS — I2510 Atherosclerotic heart disease of native coronary artery without angina pectoris: Secondary | ICD-10-CM | POA: Diagnosis present

## 2017-11-18 DIAGNOSIS — Z95810 Presence of automatic (implantable) cardiac defibrillator: Secondary | ICD-10-CM | POA: Diagnosis not present

## 2017-11-18 DIAGNOSIS — I10 Essential (primary) hypertension: Secondary | ICD-10-CM | POA: Diagnosis not present

## 2017-11-18 DIAGNOSIS — I959 Hypotension, unspecified: Secondary | ICD-10-CM | POA: Diagnosis present

## 2017-11-18 DIAGNOSIS — I481 Persistent atrial fibrillation: Secondary | ICD-10-CM | POA: Diagnosis not present

## 2017-11-18 DIAGNOSIS — I255 Ischemic cardiomyopathy: Secondary | ICD-10-CM | POA: Diagnosis present

## 2017-11-18 DIAGNOSIS — I340 Nonrheumatic mitral (valve) insufficiency: Secondary | ICD-10-CM | POA: Diagnosis not present

## 2017-11-18 DIAGNOSIS — I252 Old myocardial infarction: Secondary | ICD-10-CM | POA: Diagnosis not present

## 2017-11-18 DIAGNOSIS — E039 Hypothyroidism, unspecified: Secondary | ICD-10-CM | POA: Diagnosis present

## 2017-11-18 DIAGNOSIS — Z515 Encounter for palliative care: Secondary | ICD-10-CM

## 2017-11-18 DIAGNOSIS — I739 Peripheral vascular disease, unspecified: Secondary | ICD-10-CM

## 2017-11-18 DIAGNOSIS — I130 Hypertensive heart and chronic kidney disease with heart failure and stage 1 through stage 4 chronic kidney disease, or unspecified chronic kidney disease: Secondary | ICD-10-CM | POA: Diagnosis not present

## 2017-11-18 DIAGNOSIS — Z87891 Personal history of nicotine dependence: Secondary | ICD-10-CM

## 2017-11-18 DIAGNOSIS — R0600 Dyspnea, unspecified: Secondary | ICD-10-CM | POA: Diagnosis present

## 2017-11-18 DIAGNOSIS — I5041 Acute combined systolic (congestive) and diastolic (congestive) heart failure: Secondary | ICD-10-CM | POA: Diagnosis not present

## 2017-11-18 DIAGNOSIS — N433 Hydrocele, unspecified: Secondary | ICD-10-CM | POA: Diagnosis present

## 2017-11-18 DIAGNOSIS — N184 Chronic kidney disease, stage 4 (severe): Secondary | ICD-10-CM

## 2017-11-18 DIAGNOSIS — Z96659 Presence of unspecified artificial knee joint: Secondary | ICD-10-CM | POA: Diagnosis present

## 2017-11-18 DIAGNOSIS — D638 Anemia in other chronic diseases classified elsewhere: Secondary | ICD-10-CM | POA: Diagnosis present

## 2017-11-18 DIAGNOSIS — Z955 Presence of coronary angioplasty implant and graft: Secondary | ICD-10-CM

## 2017-11-18 DIAGNOSIS — D649 Anemia, unspecified: Secondary | ICD-10-CM | POA: Diagnosis not present

## 2017-11-18 DIAGNOSIS — Z8249 Family history of ischemic heart disease and other diseases of the circulatory system: Secondary | ICD-10-CM | POA: Diagnosis not present

## 2017-11-18 DIAGNOSIS — E1151 Type 2 diabetes mellitus with diabetic peripheral angiopathy without gangrene: Secondary | ICD-10-CM | POA: Diagnosis present

## 2017-11-18 LAB — CBC
HCT: 31.5 % — ABNORMAL LOW (ref 39.0–52.0)
HEMOGLOBIN: 9.8 g/dL — AB (ref 13.0–17.0)
MCH: 30.2 pg (ref 26.0–34.0)
MCHC: 31.1 g/dL (ref 30.0–36.0)
MCV: 96.9 fL (ref 78.0–100.0)
Platelets: 332 10*3/uL (ref 150–400)
RBC: 3.25 MIL/uL — ABNORMAL LOW (ref 4.22–5.81)
RDW: 16.9 % — ABNORMAL HIGH (ref 11.5–15.5)
WBC: 11.2 10*3/uL — ABNORMAL HIGH (ref 4.0–10.5)

## 2017-11-18 LAB — BASIC METABOLIC PANEL
Anion gap: 17 — ABNORMAL HIGH (ref 5–15)
BUN: 120 mg/dL — ABNORMAL HIGH (ref 6–20)
CALCIUM: 8.8 mg/dL — AB (ref 8.9–10.3)
CO2: 17 mmol/L — ABNORMAL LOW (ref 22–32)
Chloride: 92 mmol/L — ABNORMAL LOW (ref 101–111)
Creatinine, Ser: 3.73 mg/dL — ABNORMAL HIGH (ref 0.61–1.24)
GFR calc Af Amer: 17 mL/min — ABNORMAL LOW (ref >60)
GFR, EST NON AFRICAN AMERICAN: 14 mL/min — AB (ref >60)
GLUCOSE: 188 mg/dL — AB (ref 65–99)
Potassium: 5.4 mmol/L — ABNORMAL HIGH (ref 3.5–5.1)
SODIUM: 126 mmol/L — AB (ref 135–145)

## 2017-11-18 LAB — PROTIME-INR
INR: 4.17 — AB
PROTHROMBIN TIME: 40 s — AB (ref 11.4–15.2)

## 2017-11-18 LAB — TROPONIN I: TROPONIN I: 0.03 ng/mL — AB (ref <0.03)

## 2017-11-18 LAB — BRAIN NATRIURETIC PEPTIDE: B NATRIURETIC PEPTIDE 5: 435 pg/mL — AB (ref 0.0–100.0)

## 2017-11-18 MED ORDER — WARFARIN - PHARMACIST DOSING INPATIENT
Status: DC
  Administered 2017-11-19: 15:00:00

## 2017-11-18 MED ORDER — FUROSEMIDE 10 MG/ML IJ SOLN
80.0000 mg | Freq: Once | INTRAMUSCULAR | Status: AC
  Administered 2017-11-18: 80 mg via INTRAVENOUS
  Filled 2017-11-18: qty 8

## 2017-11-18 MED ORDER — METOPROLOL TARTRATE 5 MG/5ML IV SOLN
5.0000 mg | Freq: Once | INTRAVENOUS | Status: AC
  Administered 2017-11-18: 5 mg via INTRAVENOUS
  Filled 2017-11-18: qty 5

## 2017-11-18 NOTE — Telephone Encounter (Signed)
Nurse and patients wife notified and verbalized understanding.

## 2017-11-18 NOTE — ED Provider Notes (Signed)
Emergency Department Provider Note   I have reviewed the triage vital signs and the nursing notes.   HISTORY  Chief Complaint Shortness of Breath   HPI [name] is a 77 y.o. male multiple medical problems as documented below the presents to the emergency department today secondary to shortness of breath.  Was recently admitted for multiple issues and was discharged to couple weeks ago but over the last few days he has had progressively worsening lower extremity swelling along with shortness of breath.  He states he is gained 13 pounds since his discharge.  He has been taking his torsemide as directed.  He had decreased urine output.  No fevers, chills, cough, abdominal pain or other symptoms.  Feels similar to previous episodes of CHF exacerbation.  Also recently diagnosed with atrial fibrillation but has not been rhythm controlled yet he is supposed to follow-up as an outpatient to get this done. No other associated or modifying symptoms.    Past Medical History:  Diagnosis Date  . AICD (automatic cardioverter/defibrillator) present   . Anemia   . Anemia-chronic    a. Pt reports h/o anemia - was offered IV iron in past by nephrologist but declined and has taken PO instead.  . Aneurysm, cerebral   . Arthritis    bilateral wrist and fingers  . BPH (benign prostatic hypertrophy)   . Cardiomyopathy, ischemic    LVEF 25-35%.  . Chronic kidney disease, stage III (moderate) (HCC)    Followed by Dr. Fausto Skillern.  . Coronary atherosclerosis of native coronary artery    a. DES to RCA 03/2009. b. s/p PTCA to RCA for ISR, 05/2010. c. 03/2013 NSTEMI 2/2 severe prox RCA s/p PTCA/DES, med rx for residual dz.  . Cystitis 10/2017  . Essential hypertension, benign   . Hyperlipidemia   . Hypothyroidism   . LBBB (left bundle branch block)    s/p BiV ICD Implanted by Dr Graciela Husbands  . Morbid obesity (HCC)   . MRSA (methicillin resistant Staphylococcus aureus)   . Myocardial infarction (HCC)   .  Paroxysmal atrial fibrillation (HCC)    a. Coumadin discontinue in 2010 when the patient required ASA/Plavix for stenting. b. Coumadin restarted 05/2013, Plavix continued, ASA stopped.   . Pneumonia    years ago 1  . Pulmonary nodule    CXR, 03/2013, [facility] - not described on any subsequent chest x-rays however, could be a vascular structure  . TIA (transient ischemic attack)    Multiple TIAs  . Type 2 diabetes mellitus [facility])     Patient Active Problem List   Diagnosis Date Noted  . Acute combined systolic and diastolic CHF, NYHA class 4 (HCC) 12/09/2017  . Plantar ulcer of left foot (HCC) 10/31/2017  . Sepsis (HCC) 10/23/2017  . Septic shock (HCC)   . Acute cystitis without hematuria   . Atrial fibrillation with rapid ventricular response (HCC)   . Pressure injury of skin 10/12/2017  . Respiratory failure (HCC)   . Acute blood loss anemia   . Hematemesis   . Diabetic foot infection (HCC) 09/27/2017  . Cellulitis of left foot   . Primary osteoarthritis of both hands 06/28/2017  . Primary osteoarthritis of both feet 06/28/2017  . Rheumatoid arthritis involving multiple sites with positive rheumatoid factor (HCC) 03/27/2017  . H/O total knee replacement, bilateral 03/24/2017  . History of hypothyroidism 03/24/2017  . Former smoker 03/24/2017  . PVD (peripheral vascular disease) with claudication (HCC) 07/11/2014  . TIA (transient ischemic attack)  05/30/2013  . Long term (current) use of anticoagulants 05/30/2013  . CKD (chronic kidney disease) stage 3, GFR 30-59 ml/min (HCC) 05/29/2013  . Paroxysmal atrial fibrillation (HCC)   . Cardiomyopathy, ischemic   . Biventricular ICD (implantable cardioverter-defibrillator) in place 08/17/2012  . Diabetes mellitus (HCC) 05/15/2010  . Hypertensive cardiovascular disease 04/25/2010  . Dyslipidemia 08/28/2009  . CAD, NATIVE VESSEL 08/28/2009  . LBBB 08/28/2009  . SYSTOLIC HEART FAILURE, CHRONIC 08/28/2009    Past Surgical History:    Procedure Laterality Date  . BIV ICD GENERATOR CHANGEOUT N/A 03/18/2017   Medtronic [place] MRI conditional CRT D device implated by Dr Johney Frame  . CHOLECYSTECTOMY    . ESOPHAGOGASTRODUODENOSCOPY N/A 10/06/2017   Procedure: ESOPHAGOGASTRODUODENOSCOPY (EGD);  Surgeon: Kerin Salen, MD;  Location: [facility];  Service: Gastroenterology;  Laterality: N/A;  . ESOPHAGOGASTRODUODENOSCOPY N/A 10/07/2017   Procedure: ESOPHAGOGASTRODUODENOSCOPY (EGD);  Surgeon: Kathi Der, MD;  Location: [facility];  Service: Gastroenterology;  Laterality: N/A;  . ESOPHAGOGASTRODUODENOSCOPY (EGD) WITH PROPOFOL N/A 10/05/2017   Procedure: ESOPHAGOGASTRODUODENOSCOPY (EGD) WITH PROPOFOL;  Surgeon: West Bali, MD;  Location: [facility];  Service: Endoscopy;  Laterality: N/A;  has ICD, just FYI  . ICD placement  05/06/2011   Medtronic CRT-D  . LEFT HEART CATHETERIZATION WITH CORONARY ANGIOGRAM N/A 04/17/2013   Procedure: LEFT HEART CATHETERIZATION WITH CORONARY ANGIOGRAM;  Surgeon: Vesta Mixer, MD;  Location: [facility] CATH LAB;  Service: Cardiovascular;  Laterality: N/A;  . LOWER EXTREMITY ANGIOGRAPHY Left 10/12/2017   Procedure: Lower Extremity Angiography;  Surgeon: Nada Libman, MD;  Location: [facility] INVASIVE CV LAB;  Service: Cardiovascular;  Laterality: Left;  . LUNG SURGERY     24 - 25 yrs. old  . PERIPHERAL VASCULAR ATHERECTOMY Left 10/12/2017   Procedure: PERIPHERAL VASCULAR ATHERECTOMY;  Surgeon: Nada Libman, MD;  Location: [facility] INVASIVE CV LAB;  Service: Cardiovascular;  Laterality: Left;  SFA/POPLITEAL  . TOTAL KNEE ARTHROPLASTY    . VASECTOMY      Current Outpatient Rx  . Order #: [id number] Class: Normal  . Order #: [id number] Class: Historical Med  . Order #: [id number] Class: No Print  . Order #: [id number] Class: Normal  . Order #: [id number] Class: Normal  . Order #: [id number] Class: Print  . Order #: [id number] Class: No Print  . Order #: [id number] Class: Normal  . Order #: [id number] Class:  Historical Med  . Order #: [id number] Class: Historical Med  . Order #: [id number] Class: Historical Med  . Order #: [id number] Class: Normal  . Order #: [id number] Class: Normal  . Order #: [id number] Class: Historical Med  . Order #: [id number] Class: Normal  . Order #: [id number] Class: Historical Med  . Order #: [id number] Class: No Print    Allergies Allegra [fexofenadine]; Crestor [rosuvastatin calcium]; and Codeine  Family History  Problem Relation Age of Onset  . Diabetes Mother        Bilateral leg amputation  . Heart disease Mother        Before age 71  . Hypertension Mother   . Liver disease Mother        fatty liver   . Cancer Father        Prostate and Bone  . Diabetes Father   . Cancer Brother        Prostate  . Heart disease Other        family h/o premature cardiovascular disease  . Cancer Sister        Colon   . Hypertension Sister     Social History Social History   Tobacco  Use  . Smoking status: Former Smoker    Packs/day: 1.00    Years: 6.00    Pack years: 6.00    Types: Cigarettes    Start date: 12/08/1949    Last attempt to quit: 11/16/1964    Years since quitting: 53.0  . Smokeless tobacco: Former Neurosurgeon    Quit date: 04/15/1965  Substance Use Topics  . Alcohol use: Yes    Alcohol/week: 0.0 oz    Comment: once/month  . Drug use: No    Review of Systems  All other systems negative except as documented in the HPI. All pertinent positives and negatives as reviewed in the HPI. ____________________________________________   PHYSICAL EXAM:  VITAL SIGNS: ED Triage Vitals  Enc Vitals Group     BP 2017/12/06 1456 (!) 153/108     Pulse Rate 06-Dec-2017 1456 76     Resp 12/06/17 1456 20     Temp Dec 06, 2017 1456 97.9 F (36.6 C)     Temp Source December 06, 2017 1456 Oral     SpO2 06-Dec-2017 1456 91 %     Weight 06-Dec-2017 1454 256 lb (116.1 kg)     Height 2017/12/06 1454 5\' 8"  (1.727 m)    Constitutional: Alert and oriented. Well appearing and in no acute distress. Eyes:  Conjunctivae are normal. PERRL. EOMI. Head: Atraumatic. Nose: No congestion/rhinnorhea. Mouth/Throat: Mucous membranes are moist.  Oropharynx non-erythematous. Neck: No stridor.  No meningeal signs.   Cardiovascular: tachycardic rate, irregular rhythm. Good peripheral circulation. Grossly normal heart sounds.   Respiratory: tachypneic respiratory effort.  No retractions. Lungs with bilateral lower crackles. Gastrointestinal: Soft and nontender. No distention.  Musculoskeletal: No lower extremity tenderness 2+ edema BLE to knees. Neurologic:  Normal speech and language. No gross focal neurologic deficits are appreciated.  Skin:  Skin is warm, dry and intact. No rash noted.   ____________________________________________   LABS (all labs ordered are listed, but only abnormal results are displayed)  Labs Reviewed  BASIC METABOLIC PANEL - Abnormal; Notable for the following components:      Result Value   Sodium 126 (*)    Potassium 5.4 (*)    Chloride 92 (*)    CO2 17 (*)    Glucose, Bld 188 (*)    BUN 120 (*)    Creatinine, Ser 3.73 (*)    Calcium 8.8 (*)    GFR calc non Af Amer 14 (*)    GFR calc Af Amer 17 (*)    Anion gap 17 (*)    All other components within normal limits  CBC - Abnormal; Notable for the following components:   WBC 11.2 (*)    RBC 3.25 (*)    Hemoglobin 9.8 (*)    HCT 31.5 (*)    RDW 16.9 (*)    All other components within normal limits  TROPONIN I - Abnormal; Notable for the following components:   Troponin I 0.03 (*)    All other components within normal limits  BRAIN NATRIURETIC PEPTIDE - Abnormal; Notable for the following components:   B Natriuretic Peptide 435.0 (*)    All other components within normal limits  PROTIME-INR - Abnormal; Notable for the following components:   Prothrombin Time 40.0 (*)    INR 4.17 (*)    All other components within normal limits  CBG MONITORING, ED - Abnormal; Notable for the following components:    Glucose-Capillary 169 (*)    All other components within normal limits  PROTIME-INR   ____________________________________________  EKG  EKG Interpretation  Date/Time:  Thursday November 18 2017 15:00:48 EST Ventricular Rate:  105 PR Interval:    QRS Duration: 166 QT Interval:  338 QTC Calculation: 446 R Axis:   125 Text Interpretation:  Atrial fibrillation with rapid ventricular response with occasional ventricular-paced complexes Right axis deviation Left bundle branch block Abnormal ECG no obvious changes from december 8 Confirmed by Marily Memos [phone]) on 12/12/2017 3:03:59 PM       ____________________________________________  RADIOLOGY  Dg Chest 2 View  Result Date: 11/20/2017 CLINICAL DATA:  SOB, PT STATES HE JUST GOT OUT OF THE HOSPITAL 10-12 DAYS AGO AND HIS SOB AND FLUID RETENTION HAS CONTINUED TO WORSEN EXAM: CHEST  2 VIEW COMPARISON:  None. FINDINGS: Bilateral mild interstitial thickening. Small right pleural effusion. No pneumothorax. No focal consolidation. Stable cardiomegaly. Three lead cardiac pacemaker. No acute osseous abnormality. IMPRESSION: Findings concerning for mild CHF. Electronically Signed   By: Elige Ko   On: 12/16/2017 15:28    ____________________________________________   PROCEDURES  Procedure(s) performed:   Procedures   ____________________________________________   INITIAL IMPRESSION / ASSESSMENT AND PLAN / ED COURSE  Patient with acute on chronic heart failure exacerbation with significant weight gain, dyspnea also with atrial fibrillation.  With 13 pound weight gain and the amount of lower extremity edema that he has along with his acute kidney injury, elevated BNP and troponin the patient would benefit from IV diuresis so will admit for same.  Pertinent labs & imaging results that were available during my care of the patient were reviewed by me and considered in my medical decision making (see chart for  details).  ____________________________________________  FINAL CLINICAL IMPRESSION(S) / ED DIAGNOSES  Final diagnoses:  Acute on chronic congestive heart failure, unspecified heart failure type (HCC)  Acute renal failure superimposed on chronic kidney disease, unspecified CKD stage, unspecified acute renal failure type (HCC)     MEDICATIONS GIVEN DURING THIS VISIT:  Medications  Warfarin - Pharmacist Dosing Inpatient (not administered)  furosemide (LASIX) injection 80 mg (80 mg Intravenous Given 12/09/2017 1829)  metoprolol tartrate (LOPRESSOR) injection 5 mg (5 mg Intravenous Given 11/26/2017 1907)  metoprolol tartrate (LOPRESSOR) injection 5 mg (5 mg Intravenous Given 11/22/2017 2057)     Marily Memos, MD 11/19/17 8811

## 2017-11-18 NOTE — Telephone Encounter (Signed)
Angie, RN from [facility] contacted office today stating patients weight is up 2 lbs (256lbs) since Tuesday. Patient has +4 edema in bilateral lower extremities. Patients abdomen is 136 cm which is up 2 cm since Tuesday. Swelling in ankles is also up 2 cm since Tuesday. Nurse states HR is 112 O2 sat of 94%. Patient is very weak and having trouble walking. Nurse stated this is a change from her last visit on Tuesday. Patient reports he is not feeling well, is having increased shortness of breath and overall just feels very weak. Patient is taking demadex 10 mg tid as well as hydralazine 10 mg tid. Will send to DOD due to Dr. Purvis Sheffield being out of the office.

## 2017-11-18 NOTE — ED Notes (Signed)
ED Provider at bedside. 

## 2017-11-18 NOTE — Progress Notes (Signed)
ANTICOAGULATION CONSULT NOTE - Initial Consult  Pharmacy Consult for Warfarin (home med) Indication: atrial fibrillation  Allergies  Allergen Reactions  . Allegra [Fexofenadine] Itching  . Crestor [Rosuvastatin Calcium] Other (See Comments)    Body aches  . Codeine Other (See Comments)    blisters, but able to take hydrocodone    Patient Measurements: Height: 5\' 8"  (172.7 cm) Weight: 256 lb (116.1 kg) IBW/kg (Calculated) : 68.4 Heparin Dosing Weight:   Vital Signs: Temp: 97.9 F (36.6 C) (01/03 1456) Temp Source: Oral (01/03 1456) BP: 90/77 (01/03 1930) Pulse Rate: 123 (01/03 1930)  Labs: Recent Labs    12/12/2017 1606 11/24/2017 1900  HGB 9.8*  --   HCT 31.5*  --   PLT 332  --   LABPROT  --  40.0*  INR  --  4.17*  CREATININE 3.73*  --   TROPONINI 0.03*  --     Estimated Creatinine Clearance: 20.5 mL/min (A) (by C-G formula based on SCr of 3.73 mg/dL (H)).   Medical History: Past Medical History:  Diagnosis Date  . AICD (automatic cardioverter/defibrillator) present   . Anemia   . Anemia-chronic    a. Pt reports h/o anemia - was offered IV iron in past by nephrologist but declined and has taken PO instead.  . Aneurysm, cerebral   . Arthritis    bilateral wrist and fingers  . BPH (benign prostatic hypertrophy)   . Cardiomyopathy, ischemic    LVEF 25-35%.  . Chronic kidney disease, stage III (moderate) (HCC)    Followed by Dr. 01/16/18.  . Coronary atherosclerosis of native coronary artery    a. DES to RCA 03/2009. b. s/p PTCA to RCA for ISR, 05/2010. c. 03/2013 NSTEMI 2/2 severe prox RCA s/p PTCA/DES, med rx for residual dz.  . Cystitis 10/2017  . Essential hypertension, benign   . Hyperlipidemia   . Hypothyroidism   . LBBB (left bundle branch block)    s/p BiV ICD Implanted by Dr 11/2017  . Morbid obesity (HCC)   . MRSA (methicillin resistant Staphylococcus aureus)   . Myocardial infarction (HCC)   . Paroxysmal atrial fibrillation (HCC)    a. Coumadin  discontinue in 2010 when the patient required ASA/Plavix for stenting. b. Coumadin restarted 05/2013, Plavix continued, ASA stopped.   . Pneumonia    years ago 50  . Pulmonary nodule    CXR, 03/2013, [facility] - not described on any subsequent chest x-rays however, could be a vascular structure  . TIA (transient ischemic attack)    Multiple TIAs  . Type 2 diabetes mellitus (HCC)     Medications:   (Not in a hospital admission)  Assessment: Okay for Protocol, elevated INR.  Goal of Therapy:  INR 2-3   Plan:  No Warfarin tonight Daily PT/INR Monitor for signs and symptoms of bleeding.   04/2013 11/17/2017,8:44 PM

## 2017-11-18 NOTE — ED Notes (Signed)
CRITICAL VALUE ALERT  Critical Value:  Troponin - 0.03  Date & Time Notied:  Dec 02, 2017  1730  Provider Notified: Dr Clayborne Dana  Orders Received/Actions taken:

## 2017-11-18 NOTE — Telephone Encounter (Signed)
Actually, he is my patient. It looks like he saw Dr. Purvis Sheffield recently for a TOC visit. I am concerned about his current symptoms. It looks like he is developing worsening weight gain even n the last week and following increase in Demadex. Also the elevated heart rate may be uncontrolled atrial fibrillation based on review of his recent course. If he is worsening with progressive fatigue and feeling poorly, I think that he should be reevaluated in the ER for rehospitalization.

## 2017-11-18 NOTE — ED Triage Notes (Signed)
Patient complaining of shortness of breath with exertion. States "I have a lot of fluid buildup in my legs and stomach."

## 2017-11-18 NOTE — ED Notes (Signed)
Date and time results received: 11/28/2017 2020 (use smartphrase ".now" to insert current time)  Test: INR Critical Value: 4.17  Name of Provider Notified: Nelson Chimes  Orders Received? Or Actions Taken?:

## 2017-11-18 NOTE — H&P (Addendum)
History and Physical    [name] VXB:[id number] DOB: March 02, 1941 DOA: 11/20/2017  PCP: [name], MD Patient coming from: Home  Chief Complaint: Shortness of breath  HPI: [name] is a 77 y.o. male with medical history significant of ischemic cardiomyopathy with ejection fraction 25%, CKD stage III, coronary artery disease, essential hypertension, hypothyroidism, obesity, anemia of chronic disease, paroxysmal atrial fibrillation, peripheral vascular disease came to the ER for evaluation of shortness of breath and weight gain.  Patient had a recent prolonged 2 hospital stays and was last discharged on November 06, 2017.  Patient states after going home he has noticed progressively increasing shortness of breath and weight gain.  He is also reported of some palpitations but denies any dizziness, chest pain, lightheadedness.  During this time he is also noticed increase in bowel extremity swelling.  He has noted about 15 pounds of weight gain.  He admits of taking his diuretics as prescribed.  He was advised to come to the ER for evaluation.  In the ER patient was noted to be slightly hyperkalemic without EKG changes, hyponatremia and acute kidney injury on CKD stage III.  Patient was also atrial fibrillation with RVR with signs of acute systolic CHF exacerbation.  He was given 80 mg of IV Lasix and admitted for further care.  His recent 2 admissions were from GI bleed and septic shock from left lower extremity cellulitis/urinary tract infection.   Review of Systems: As per HPI otherwise 10 point review of systems negative.   Past Medical History:  Diagnosis Date  . AICD (automatic cardioverter/defibrillator) present   . Anemia   . Anemia-chronic    a. Pt reports h/o anemia - was offered IV iron in past by nephrologist but declined and has taken PO instead.  . Aneurysm, cerebral   . Arthritis    bilateral wrist and fingers  . BPH (benign prostatic hypertrophy)   . Cardiomyopathy,  ischemic    LVEF 25-35%.  . Chronic kidney disease, stage III (moderate) (HCC)    Followed by Dr. Fausto Skillern.  . Coronary atherosclerosis of native coronary artery    a. DES to RCA 03/2009. b. s/p PTCA to RCA for ISR, 05/2010. c. 03/2013 NSTEMI 2/2 severe prox RCA s/p PTCA/DES, med rx for residual dz.  . Cystitis 10/2017  . Essential hypertension, benign   . Hyperlipidemia   . Hypothyroidism   . LBBB (left bundle branch block)    s/p BiV ICD Implanted by Dr Graciela Husbands  . Morbid obesity (HCC)   . MRSA (methicillin resistant Staphylococcus aureus)   . Myocardial infarction (HCC)   . Paroxysmal atrial fibrillation (HCC)    a. Coumadin discontinue in 2010 when the patient required ASA/Plavix for stenting. b. Coumadin restarted 05/2013, Plavix continued, ASA stopped.   . Pneumonia    years ago 50  . Pulmonary nodule    CXR, 03/2013, [facility] - not described on any subsequent chest x-rays however, could be a vascular structure  . TIA (transient ischemic attack)    Multiple TIAs  . Type 2 diabetes mellitus (HCC)     Past Surgical History:  Procedure Laterality Date  . BIV ICD GENERATOR CHANGEOUT N/A 03/18/2017   Medtronic [place] MRI conditional CRT D device implated by Dr Johney Frame  . CHOLECYSTECTOMY    . ESOPHAGOGASTRODUODENOSCOPY N/A 10/06/2017   Procedure: ESOPHAGOGASTRODUODENOSCOPY (EGD);  Surgeon: Kerin Salen, MD;  Location: [facility];  Service: Gastroenterology;  Laterality: N/A;  . ESOPHAGOGASTRODUODENOSCOPY N/A 10/07/2017   Procedure: ESOPHAGOGASTRODUODENOSCOPY (  EGD);  Surgeon: Kathi Der, MD;  Location: [facility];  Service: Gastroenterology;  Laterality: N/A;  . ESOPHAGOGASTRODUODENOSCOPY (EGD) WITH PROPOFOL N/A 10/05/2017   Procedure: ESOPHAGOGASTRODUODENOSCOPY (EGD) WITH PROPOFOL;  Surgeon: West Bali, MD;  Location: [facility];  Service: Endoscopy;  Laterality: N/A;  has ICD, just FYI  . ICD placement  05/06/2011   Medtronic CRT-D  . LEFT HEART CATHETERIZATION WITH  CORONARY ANGIOGRAM N/A 04/17/2013   Procedure: LEFT HEART CATHETERIZATION WITH CORONARY ANGIOGRAM;  Surgeon: Vesta Mixer, MD;  Location[facility];  Service: Cardiovascular;  Laterality: N/A;  . LOWER EXTREMITY ANGIOGRAPHY Left 10/12/2017   Procedure: Lower Extremity Angiography;  Surgeon: Nada Libman, MD;  Location: [facility] INVASIVE CV LAB;  Service: Cardiovascular;  Laterality: Left;  . LUNG SURGERY     24 - 25 yrs. old  . PERIPHERAL VASCULAR ATHERECTOMY Left 10/12/2017   Procedure: PERIPHERAL VASCULAR ATHERECTOMY;  Surgeon: Nada Libman, MD;  Location: [facility] INVASIVE CV LAB;  Service: Cardiovascular;  Laterality: Left;  SFA/POPLITEAL  . TOTAL KNEE ARTHROPLASTY    . VASECTOMY       reports that he quit smoking about 53 years ago. His smoking use included cigarettes. He started smoking about 67 years ago. He has a 6.00 pack-year smoking history. He quit smokeless tobacco use about 52 years ago. He reports that he drinks alcohol. He reports that he does not use drugs.  Allergies  Allergen Reactions  . Allegra [Fexofenadine] Itching  . Crestor [Rosuvastatin Calcium] Other (See Comments)    Body aches  . Codeine Other (See Comments)    blisters, but able to take hydrocodone    Family History  Problem Relation Age of Onset  . Diabetes Mother        Bilateral leg amputation  . Heart disease Mother        Before age 42  . Hypertension Mother   . Liver disease Mother        fatty liver   . Cancer Father        Prostate and Bone  . Diabetes Father   . Cancer Brother        Prostate  . Heart disease Other        family h/o premature cardiovascular disease  . Cancer Sister        Colon   . Hypertension Sister      Prior to Admission medications   Medication Sig Start Date End Date Taking? Authorizing Provider  acetaminophen (TYLENOL 8 HOUR ARTHRITIS PAIN) 650 MG CR tablet Take 1 tablet by mouth daily as needed for pain. 09/15/10   [provider]  carvedilol  (COREG) 6.25 MG tablet Take 3 tablets (18.75 mg total) by mouth 2 (two) times daily with a meal. 11/06/17   Danford, Earl Lites, MD  Cholecalciferol (VITAMIN D3) 10000 units TABS Take 1,000 Units by mouth daily.    [provider]  clopidogrel (PLAVIX) 75 MG tablet Take 1 tablet (75 mg total) by mouth daily. 03/25/17   Allred, Fayrene Fearing, MD  collagenase (SANTYL) ointment Apply 1 application topically daily. Apply to the affected area daily plus dry dressing 11/01/17   Nadara Mustard, MD  hydrALAZINE (APRESOLINE) 10 MG tablet Take 1 tablet (10 mg total) by mouth every 8 (eight) hours. 11/06/17   Danford, Earl Lites, MD  HYDROcodone-acetaminophen (NORCO) 7.5-325 MG tablet Take 1 tablet by mouth every 6 (six) hours as needed for moderate pain. 10/15/17   Zannie Cove, MD  insulin lispro protamine-lispro (HUMALOG 75/25 MIX) (75-25) 100 UNIT/ML SUSP injection Inject 10 Units into the skin 2 (two) times daily with a meal. Take 10units BID with meals Patient taking differently: Inject 10 Units into the skin 2 (two) times daily with a meal.  10/15/17   Zannie Cove, MD  isosorbide dinitrate (ISORDIL) 10 MG tablet Take 1 tablet (10 mg total) by mouth 3 (three) times daily. 11/06/17   Danford, Earl Lites, MD  l-methylfolate-B6-B12 (METANX) 3-35-2 MG TABS tablet Take 1 tablet by mouth daily.    [provider]  levothyroxine (SYNTHROID, LEVOTHROID) 50 MCG tablet Take 50 mcg by mouth daily. 06/29/17   [provider]  nitroGLYCERIN (NITROSTAT) 0.4 MG SL tablet Place 0.4 mg under the tongue every 5 (five) minutes as needed for chest pain. For chest pain 04/26/13   Serpe, Clide Deutscher, PA-C  pantoprazole (PROTONIX) 40 MG tablet Take 1 tablet (40 mg total) by mouth 2 (two) times daily before a meal. 11/06/17   Danford, Earl Lites, MD  potassium chloride SA (K-DUR,KLOR-CON) 20 MEQ tablet Take 1 tablet (20 mEq total) by mouth daily. 11/07/17   Danford, Earl Lites, MD  protein  supplement shake (PREMIER PROTEIN) LIQD Take 325 mLs (11 oz total) by mouth 2 (two) times daily between meals. 11/06/17   Danford, Earl Lites, MD  tamsulosin (FLOMAX) 0.4 MG CAPS capsule Take 0.4 mg by mouth daily as needed (bladder).     [provider]  torsemide (DEMADEX) 10 MG tablet Take 1 tablet (10 mg total) by mouth 2 (two) times daily. Patient taking differently: Take 10 mg by mouth daily. May take additional 10mg  by mouth in evening as needed for fluid 12/30/15   01/01/16, MD  warfarin (COUMADIN) 2.5 MG tablet Take 2.5-5 mg by mouth See admin instructions. Pt takes 2.5mg  by mouth once daily on Mon, Wed, Fri - takes 5mg  once daily Tues, Thurs, Sat, Sun    [provider]  Zinc 50 MG CAPS Take 50 mg by mouth every evening.     [provider]    Physical Exam: Vitals:   11-30-2017 1454 11-30-17 1456 11-30-17 1745 11/30/2017 1750  BP:  (!) 153/108 99/76 115/86  Pulse:  76 (!) 108 (!) 121  Resp:  20 17 (!) 22  Temp:  97.9 F (36.6 C)    TempSrc:  Oral    SpO2:  91% 95% 94%  Weight: 116.1 kg (256 lb)     Height: 5\' 8"  (1.727 m)         Constitutional: NAD, calm, comfortable Vitals:   Nov 30, 2017 1454 November 30, 2017 1456 11-30-17 1745 2017-11-30 1750  BP:  (!) 153/108 99/76 115/86  Pulse:  76 (!) 108 (!) 121  Resp:  20 17 (!) 22  Temp:  97.9 F (36.6 C)    TempSrc:  Oral    SpO2:  91% 95% 94%  Weight: 116.1 kg (256 lb)     Height: 5\' 8"  (1.727 m)      Eyes: PERRL, lids and conjunctivae normal ENMT: Mucous membranes are moist. Posterior pharynx clear of any exudate or lesions.Normal dentition.  Neck: normal, supple, no masses, no thyromegaly. 12 cm JVD Respiratory: Diminished sounds at the bases. Normal respiratory effort. No accessory muscle use.  Cardiovascular: IRRegular rate and rhythm, no murmurs / rubs / gallops. B/l LE 3+ pitting extremity edema. 2+ pedal pulses. No carotid bruits.  Abdomen: no tenderness, no masses palpated. No  hepatosplenomegaly. Bowel sounds positive.  Musculoskeletal:  no clubbing / cyanosis. No joint deformity upper and  lower extremities. Good ROM, no contractures. Normal muscle tone.  Skin: no rashes, lesions, ulcers. No induration Neurologic: CN 2-12 grossly intact. Sensation intact, DTR normal. Strength 5/5 in all 4.  Psychiatric: Normal judgment and insight. Alert and oriented x 3. Normal mood.     Labs on Admission: I have personally reviewed following labs and imaging studies  CBC: Recent Labs  Lab December 06, 2017 1606  WBC 11.2*  HGB 9.8*  HCT 31.5*  MCV 96.9  PLT 332   Basic Metabolic Panel: Recent Labs  Lab Dec 06, 2017 1606  NA 126*  K 5.4*  CL 92*  CO2 17*  GLUCOSE 188*  BUN 120*  CREATININE 3.73*  CALCIUM 8.8*   GFR: Estimated Creatinine Clearance: 20.5 mL/min (A) (by C-G formula based on SCr of 3.73 mg/dL (H)). Liver Function Tests: No results for input(s): AST, ALT, ALKPHOS, BILITOT, PROT, ALBUMIN in the last 168 hours. No results for input(s): LIPASE, AMYLASE in the last 168 hours. No results for input(s): AMMONIA in the last 168 hours. Coagulation Profile: No results for input(s): INR, PROTIME in the last 168 hours. Cardiac Enzymes: Recent Labs  Lab 12/06/2017 1606  TROPONINI 0.03*   BNP (last 3 results) No results for input(s): PROBNP in the last 8760 hours. HbA1C: No results for input(s): HGBA1C in the last 72 hours. CBG: No results for input(s): GLUCAP in the last 168 hours. Lipid Profile: No results for input(s): CHOL, HDL, LDLCALC, TRIG, CHOLHDL, LDLDIRECT in the last 72 hours. Thyroid Function Tests: No results for input(s): TSH, T4TOTAL, FREET4, T3FREE, THYROIDAB in the last 72 hours. Anemia Panel: No results for input(s): VITAMINB12, FOLATE, FERRITIN, TIBC, IRON, RETICCTPCT in the last 72 hours. Urine analysis:    Component Value Date/Time   COLORURINE AMBER (A) 10/12/2017 0450   APPEARANCEUR CLOUDY (A) 10/12/2017 0450   LABSPEC 1.024 10/12/2017  0450   PHURINE 5.0 10/12/2017 0450   GLUCOSEU NEGATIVE 10/12/2017 0450   HGBUR LARGE (A) 10/12/2017 0450   BILIRUBINUR NEGATIVE 10/12/2017 0450   KETONESUR 5 (A) 10/12/2017 0450   PROTEINUR 100 (A) 10/12/2017 0450   UROBILINOGEN 0.2 07/05/2014 2117   NITRITE NEGATIVE 10/12/2017 0450   LEUKOCYTESUR TRACE (A) 10/12/2017 0450   Sepsis Labs: !!!!!!!!!!!!!!!!!!!!!!!!!!!!!!!!!!!!!!!!!!!! @LABRCNTIP (procalcitonin:4,lacticidven:4) )No results found for this or any previous visit (from the past 240 hour(s)).   Radiological Exams on Admission: Dg Chest 2 View  Result Date: 12-06-2017 CLINICAL DATA:  SOB, PT STATES HE JUST GOT OUT OF THE HOSPITAL 10-12 DAYS AGO AND HIS SOB AND FLUID RETENTION HAS CONTINUED TO WORSEN EXAM: CHEST  2 VIEW COMPARISON:  None. FINDINGS: Bilateral mild interstitial thickening. Small right pleural effusion. No pneumothorax. No focal consolidation. Stable cardiomegaly. Three lead cardiac pacemaker. No acute osseous abnormality. IMPRESSION: Findings concerning for mild CHF. Electronically Signed   By: 01/16/2018   On: December 06, 2017 15:28    EKG: Independently reviewed. A fib with rvr, rate 100-120  Assessment/Plan Active Problems:   Acute combined systolic and diastolic CHF, NYHA class 4 (HCC)     Acute Moderate Combined Systolic and Diastolic CHF, Class III-IV Shortness of Breath with exertion  Atrial Fibrillation with rvr  History of coronary artery disease -Admit the patient to the stepdown unit; ejection fraction 20%. Has BiVICD in place  -Signs of volume overload on physical exam and chest x-ray - He will get IV Lasix scheduled, strict input and output, fluid restriction - Check daily weight, echocardiogram from 4 weeks  ago reviewed - Will need cardiology consult in the morning -Supplemental oxygen -For rate controlled ordered Lopressor 5 mg IV once due to borderline low blood pressure.  Hopefully controlling the rate will improve blood pressure, if not we can  try digoxin or other medications.  Hold Imdur for now.  Plans to restart home Coreg.  Not on home ACE inhibitor/ARB due to renal dysfunction. -Continue Coumadin, pharmacy to dose -He is scheduled for outpatient nuclear medicine stress test on November 24, 2016  Acute kidney injury on CKD stage III-4 -Likely secondary to fluid overload, hopefully this will improve as he gets diuresed. -Avoid nephrotoxic drugs.  Hyponatremia - Secondary to hypervolemia from CHF.  Monitor sodium as he gets diuresed  Hyperkalemia without EKG changes -Hold off on home potassium supplements.  He is getting Lasix therefore should help hyperkalemia  Essential hypertension -Planning on holding Imdur due to borderline low blood pressure at this time.  We will continue Coreg as I think controlling the heart rate will improve blood pressure  Peripheral vascular disease - On statin and Coumadin.  Had recent angioplasty of a peripheral artery as well  Slightly enlarged scrotum -Possibly from fluid retention.  Anemia - From chronic disease due to renal dysfunction.   DVT prophylaxis: On Coumadin  Code Status: Full  Family Communication: None at bedside Disposition Plan:  TBD Consults called: Cardiology in am Admission status: Stepdown Admit   Tyler Zanella Joline Maxcy MD [facility] Pager [phone]  If 7PM-7AM, please contact night-coverage [email or web address] Password [email or web address]  11/19/2017, 7:01 PM

## 2017-11-19 ENCOUNTER — Inpatient Hospital Stay (HOSPITAL_COMMUNITY): Payer: PPO

## 2017-11-19 ENCOUNTER — Other Ambulatory Visit: Payer: Self-pay

## 2017-11-19 ENCOUNTER — Encounter (HOSPITAL_COMMUNITY): Payer: Self-pay | Admitting: Primary Care

## 2017-11-19 DIAGNOSIS — I469 Cardiac arrest, cause unspecified: Secondary | ICD-10-CM

## 2017-11-19 DIAGNOSIS — N184 Chronic kidney disease, stage 4 (severe): Secondary | ICD-10-CM

## 2017-11-19 DIAGNOSIS — Z515 Encounter for palliative care: Secondary | ICD-10-CM

## 2017-11-19 DIAGNOSIS — I255 Ischemic cardiomyopathy: Secondary | ICD-10-CM

## 2017-11-19 DIAGNOSIS — N179 Acute kidney failure, unspecified: Secondary | ICD-10-CM

## 2017-11-19 DIAGNOSIS — Z7189 Other specified counseling: Secondary | ICD-10-CM

## 2017-11-19 DIAGNOSIS — J9601 Acute respiratory failure with hypoxia: Secondary | ICD-10-CM

## 2017-11-19 DIAGNOSIS — I5043 Acute on chronic combined systolic (congestive) and diastolic (congestive) heart failure: Secondary | ICD-10-CM

## 2017-11-19 DIAGNOSIS — I340 Nonrheumatic mitral (valve) insufficiency: Secondary | ICD-10-CM

## 2017-11-19 DIAGNOSIS — I4819 Other persistent atrial fibrillation: Secondary | ICD-10-CM

## 2017-11-19 DIAGNOSIS — I481 Persistent atrial fibrillation: Secondary | ICD-10-CM

## 2017-11-19 DIAGNOSIS — E875 Hyperkalemia: Secondary | ICD-10-CM

## 2017-11-19 LAB — BASIC METABOLIC PANEL
ANION GAP: 14 (ref 5–15)
Anion gap: 19 — ABNORMAL HIGH (ref 5–15)
Anion gap: 16 — ABNORMAL HIGH (ref 5–15)
BUN: 130 mg/dL — AB (ref 6–20)
BUN: 136 mg/dL — ABNORMAL HIGH (ref 6–20)
BUN: 128 mg/dL — AB (ref 6–20)
CALCIUM: 8.9 mg/dL (ref 8.9–10.3)
CHLORIDE: 96 mmol/L — AB (ref 101–111)
CHLORIDE: 94 mmol/L — AB (ref 101–111)
CO2: 14 mmol/L — AB (ref 22–32)
CO2: 19 mmol/L — ABNORMAL LOW (ref 22–32)
CO2: 19 mmol/L — AB (ref 22–32)
CREATININE: 4.05 mg/dL — AB (ref 0.61–1.24)
CREATININE: 4.02 mg/dL — AB (ref 0.61–1.24)
CREATININE: 4.17 mg/dL — AB (ref 0.61–1.24)
Calcium: 9 mg/dL (ref 8.9–10.3)
Calcium: 8.7 mg/dL — ABNORMAL LOW (ref 8.9–10.3)
Chloride: 95 mmol/L — ABNORMAL LOW (ref 101–111)
GFR calc Af Amer: 15 mL/min — ABNORMAL LOW (ref >60)
GFR calc Af Amer: 15 mL/min — ABNORMAL LOW (ref >60)
GFR calc non Af Amer: 13 mL/min — ABNORMAL LOW (ref >60)
GFR calc non Af Amer: 13 mL/min — ABNORMAL LOW (ref >60)
GFR, EST AFRICAN AMERICAN: 15 mL/min — AB (ref >60)
GFR, EST NON AFRICAN AMERICAN: 13 mL/min — AB (ref >60)
Glucose, Bld: 202 mg/dL — ABNORMAL HIGH (ref 65–99)
Glucose, Bld: 242 mg/dL — ABNORMAL HIGH (ref 65–99)
Glucose, Bld: 234 mg/dL — ABNORMAL HIGH (ref 65–99)
Potassium: 5.8 mmol/L — ABNORMAL HIGH (ref 3.5–5.1)
Potassium: 5.2 mmol/L — ABNORMAL HIGH (ref 3.5–5.1)
Potassium: 5 mmol/L (ref 3.5–5.1)
SODIUM: 128 mmol/L — AB (ref 135–145)
Sodium: 129 mmol/L — ABNORMAL LOW (ref 135–145)
Sodium: 129 mmol/L — ABNORMAL LOW (ref 135–145)

## 2017-11-19 LAB — COMPREHENSIVE METABOLIC PANEL
ALT: 129 U/L — ABNORMAL HIGH (ref 17–63)
ANION GAP: 17 — AB (ref 5–15)
AST: 261 U/L — AB (ref 15–41)
Albumin: 2.9 g/dL — ABNORMAL LOW (ref 3.5–5.0)
Alkaline Phosphatase: 106 U/L (ref 38–126)
BUN: 127 mg/dL — AB (ref 6–20)
CHLORIDE: 94 mmol/L — AB (ref 101–111)
CO2: 18 mmol/L — AB (ref 22–32)
Calcium: 9 mg/dL (ref 8.9–10.3)
Creatinine, Ser: 4.09 mg/dL — ABNORMAL HIGH (ref 0.61–1.24)
GFR calc Af Amer: 15 mL/min — ABNORMAL LOW (ref >60)
GFR calc non Af Amer: 13 mL/min — ABNORMAL LOW (ref >60)
GLUCOSE: 183 mg/dL — AB (ref 65–99)
POTASSIUM: 6 mmol/L — AB (ref 3.5–5.1)
SODIUM: 129 mmol/L — AB (ref 135–145)
Total Bilirubin: 1.4 mg/dL — ABNORMAL HIGH (ref 0.3–1.2)
Total Protein: 7.3 g/dL (ref 6.5–8.1)

## 2017-11-19 LAB — GLUCOSE, CAPILLARY
GLUCOSE-CAPILLARY: 188 mg/dL — AB (ref 65–99)
Glucose-Capillary: 195 mg/dL — ABNORMAL HIGH (ref 65–99)
Glucose-Capillary: 220 mg/dL — ABNORMAL HIGH (ref 65–99)
Glucose-Capillary: 236 mg/dL — ABNORMAL HIGH (ref 65–99)

## 2017-11-19 LAB — TROPONIN I
TROPONIN I: 0.04 ng/mL — AB (ref <0.03)
TROPONIN I: 0.07 ng/mL — AB (ref <0.03)
Troponin I: 0.03 ng/mL (ref <0.03)
Troponin I: 0.08 ng/mL (ref <0.03)

## 2017-11-19 LAB — ECHOCARDIOGRAM COMPLETE
Height: 68 in
Weight: 4056.46 oz

## 2017-11-19 LAB — CBG MONITORING, ED: GLUCOSE-CAPILLARY: 169 mg/dL — AB (ref 65–99)

## 2017-11-19 LAB — COOXEMETRY PANEL
CARBOXYHEMOGLOBIN: 1.7 % — AB (ref 0.5–1.5)
Methemoglobin: 0.9 % (ref 0.0–1.5)
O2 Saturation: 70.1 %
TOTAL HEMOGLOBIN: 9.3 g/dL — AB (ref 12.0–16.0)
Total oxygen content: 9 mL/dL — ABNORMAL LOW (ref 15.0–23.0)

## 2017-11-19 LAB — PROTIME-INR
INR: 5.13 — AB
Prothrombin Time: 47 seconds — ABNORMAL HIGH (ref 11.4–15.2)

## 2017-11-19 LAB — MAGNESIUM: Magnesium: 2.3 mg/dL (ref 1.7–2.4)

## 2017-11-19 MED ORDER — SODIUM POLYSTYRENE SULFONATE 15 GM/60ML PO SUSP
30.0000 g | Freq: Once | ORAL | Status: AC
  Administered 2017-11-19: 30 g via ORAL
  Filled 2017-11-19: qty 120

## 2017-11-19 MED ORDER — AMIODARONE HCL IN DEXTROSE 360-4.14 MG/200ML-% IV SOLN
30.0000 mg/h | INTRAVENOUS | Status: DC
  Administered 2017-11-19 – 2017-11-20 (×3): 30 mg/h via INTRAVENOUS
  Filled 2017-11-19: qty 200

## 2017-11-19 MED ORDER — SODIUM CHLORIDE 0.9 % IV SOLN
INTRAVENOUS | Status: AC
  Filled 2017-11-19: qty 10

## 2017-11-19 MED ORDER — LEVOTHYROXINE SODIUM 25 MCG PO TABS
50.0000 ug | ORAL_TABLET | Freq: Every day | ORAL | Status: DC
  Administered 2017-11-19 – 2017-11-20 (×2): 50 ug via ORAL
  Filled 2017-11-19 (×2): qty 2

## 2017-11-19 MED ORDER — SODIUM POLYSTYRENE SULFONATE 15 GM/60ML PO SUSP
45.0000 g | Freq: Once | ORAL | Status: AC
  Administered 2017-11-19: 45 g via ORAL
  Filled 2017-11-19: qty 180

## 2017-11-19 MED ORDER — INSULIN ASPART 100 UNIT/ML ~~LOC~~ SOLN: 0.0000 [IU] | Freq: Every day | SUBCUTANEOUS | Status: DC

## 2017-11-19 MED ORDER — INSULIN ASPART 100 UNIT/ML ~~LOC~~ SOLN
0.0000 [IU] | Freq: Every day | SUBCUTANEOUS | Status: DC
  Administered 2017-11-19: 2 [IU] via SUBCUTANEOUS

## 2017-11-19 MED ORDER — AMIODARONE HCL IN DEXTROSE 360-4.14 MG/200ML-% IV SOLN
60.0000 mg/h | INTRAVENOUS | Status: AC
  Administered 2017-11-19: 60 mg/h via INTRAVENOUS
  Filled 2017-11-19: qty 200

## 2017-11-19 MED ORDER — TAMSULOSIN HCL 0.4 MG PO CAPS: 0.4000 mg | ORAL_CAPSULE | Freq: Every day | ORAL | Status: DC | PRN

## 2017-11-19 MED ORDER — HYDROCODONE-ACETAMINOPHEN 7.5-325 MG PO TABS
1.0000 | ORAL_TABLET | Freq: Four times a day (QID) | ORAL | Status: DC | PRN
  Administered 2017-11-20: 1 via ORAL
  Filled 2017-11-19 (×3): qty 1

## 2017-11-19 MED ORDER — WARFARIN SODIUM 2.5 MG PO TABS: 2.5000 mg | ORAL_TABLET | ORAL | Status: DC

## 2017-11-19 MED ORDER — SODIUM CHLORIDE 0.9 % IV SOLN
1.0000 g | Freq: Once | INTRAVENOUS | Status: AC
  Administered 2017-11-19: 1 g via INTRAVENOUS

## 2017-11-19 MED ORDER — ALUM & MAG HYDROXIDE-SIMETH 200-200-20 MG/5ML PO SUSP
30.0000 mL | Freq: Once | ORAL | Status: AC
  Administered 2017-11-19: 30 mL via ORAL
  Filled 2017-11-19: qty 30

## 2017-11-19 MED ORDER — SODIUM POLYSTYRENE SULFONATE 15 GM/60ML PO SUSP: 45.0000 g | Freq: Once | ORAL | Status: DC

## 2017-11-19 MED ORDER — INSULIN ASPART PROT & ASPART (70-30 MIX) 100 UNIT/ML ~~LOC~~ SUSP
5.0000 [IU] | Freq: Two times a day (BID) | SUBCUTANEOUS | Status: DC
  Administered 2017-11-19 – 2017-11-20 (×2): 5 [IU] via SUBCUTANEOUS
  Filled 2017-11-19: qty 10

## 2017-11-19 MED ORDER — VITAMIN D 1000 UNITS PO TABS
1000.0000 [IU] | ORAL_TABLET | Freq: Every day | ORAL | Status: DC
  Administered 2017-11-19 – 2017-11-20 (×2): 1000 [IU] via ORAL
  Filled 2017-11-19 (×2): qty 1

## 2017-11-19 MED ORDER — DOBUTAMINE IN D5W 4-5 MG/ML-% IV SOLN
2.0000 ug/kg/min | INTRAVENOUS | Status: DC
  Administered 2017-11-19: 2 ug/kg/min via INTRAVENOUS
  Filled 2017-11-19: qty 250

## 2017-11-19 MED ORDER — ZINC 50 MG PO CAPS: 50.0000 mg | ORAL_CAPSULE | Freq: Every evening | ORAL | Status: DC

## 2017-11-19 MED ORDER — ONDANSETRON HCL 4 MG/2ML IJ SOLN
4.0000 mg | Freq: Four times a day (QID) | INTRAMUSCULAR | Status: DC | PRN
  Administered 2017-11-19 – 2017-11-20 (×3): 4 mg via INTRAVENOUS
  Filled 2017-11-19 (×3): qty 2

## 2017-11-19 MED ORDER — INSULIN ASPART 100 UNIT/ML ~~LOC~~ SOLN
0.0000 [IU] | Freq: Three times a day (TID) | SUBCUTANEOUS | Status: DC
  Administered 2017-11-19: 2 [IU] via SUBCUTANEOUS
  Administered 2017-11-19 – 2017-11-20 (×3): 3 [IU] via SUBCUTANEOUS

## 2017-11-19 MED ORDER — L-METHYLFOLATE-B6-B12 3-35-2 MG PO TABS
1.0000 | ORAL_TABLET | Freq: Every day | ORAL | Status: DC
  Administered 2017-11-19 – 2017-11-20 (×2): 1 via ORAL
  Filled 2017-11-19 (×4): qty 1

## 2017-11-19 MED ORDER — ZINC SULFATE 220 (50 ZN) MG PO CAPS: 220.0000 mg | ORAL_CAPSULE | Freq: Every day | ORAL | Status: DC

## 2017-11-19 MED ORDER — INSULIN ASPART PROT & ASPART (70-30 MIX) 100 UNIT/ML ~~LOC~~ SUSP
10.0000 [IU] | Freq: Two times a day (BID) | SUBCUTANEOUS | Status: DC
  Filled 2017-11-19: qty 10

## 2017-11-19 MED ORDER — PHYTONADIONE 5 MG PO TABS
5.0000 mg | ORAL_TABLET | Freq: Once | ORAL | Status: AC
  Administered 2017-11-19: 5 mg via ORAL
  Filled 2017-11-19: qty 1

## 2017-11-19 MED ORDER — HYDRALAZINE HCL 10 MG PO TABS: 10.0000 mg | ORAL_TABLET | Freq: Three times a day (TID) | ORAL | Status: DC

## 2017-11-19 MED ORDER — CLOPIDOGREL BISULFATE 75 MG PO TABS
75.0000 mg | ORAL_TABLET | Freq: Every day | ORAL | Status: DC
  Administered 2017-11-19 – 2017-11-20 (×2): 75 mg via ORAL
  Filled 2017-11-19 (×2): qty 1

## 2017-11-19 MED ORDER — PERFLUTREN LIPID MICROSPHERE
1.0000 mL | INTRAVENOUS | Status: AC | PRN
  Administered 2017-11-19: 1 mL via INTRAVENOUS
  Filled 2017-11-19: qty 10

## 2017-11-19 MED ORDER — VITAMIN D3 250 MCG (10000 UT) PO TABS: 1000.0000 [IU] | ORAL_TABLET | Freq: Every day | ORAL | Status: DC

## 2017-11-19 MED ORDER — SODIUM CHLORIDE 0.9% FLUSH: 3.0000 mL | INTRAVENOUS | Status: DC | PRN

## 2017-11-19 MED ORDER — SODIUM CHLORIDE 0.9% FLUSH
3.0000 mL | Freq: Two times a day (BID) | INTRAVENOUS | Status: DC
  Administered 2017-11-19 – 2017-11-20 (×2): 3 mL via INTRAVENOUS

## 2017-11-19 MED ORDER — PREMIER PROTEIN SHAKE: 11.0000 [oz_av] | Freq: Two times a day (BID) | ORAL | Status: DC

## 2017-11-19 MED ORDER — ACETAMINOPHEN 325 MG PO TABS: 650.0000 mg | ORAL_TABLET | ORAL | Status: DC | PRN

## 2017-11-19 MED ORDER — FUROSEMIDE 10 MG/ML IJ SOLN
80.0000 mg | Freq: Three times a day (TID) | INTRAMUSCULAR | Status: DC
  Administered 2017-11-19 – 2017-11-20 (×3): 80 mg via INTRAVENOUS
  Filled 2017-11-19 (×3): qty 8

## 2017-11-19 MED ORDER — FUROSEMIDE 10 MG/ML IJ SOLN
80.0000 mg | Freq: Two times a day (BID) | INTRAMUSCULAR | Status: DC
  Administered 2017-11-19 (×2): 80 mg via INTRAVENOUS
  Filled 2017-11-19 (×2): qty 8

## 2017-11-19 MED ORDER — INSULIN ASPART 100 UNIT/ML ~~LOC~~ SOLN: 0.0000 [IU] | Freq: Three times a day (TID) | SUBCUTANEOUS | Status: DC

## 2017-11-19 MED ORDER — CARVEDILOL 12.5 MG PO TABS
18.7500 mg | ORAL_TABLET | Freq: Two times a day (BID) | ORAL | Status: DC
  Filled 2017-11-19: qty 2

## 2017-11-19 MED ORDER — PANTOPRAZOLE SODIUM 40 MG PO TBEC
40.0000 mg | DELAYED_RELEASE_TABLET | Freq: Two times a day (BID) | ORAL | Status: DC
  Administered 2017-11-19 – 2017-11-20 (×3): 40 mg via ORAL
  Filled 2017-11-19 (×3): qty 1

## 2017-11-19 MED ORDER — NITROGLYCERIN 0.4 MG SL SUBL
0.4000 mg | SUBLINGUAL_TABLET | SUBLINGUAL | Status: DC | PRN
  Administered 2017-11-19: 0.4 mg via SUBLINGUAL
  Filled 2017-11-19 (×2): qty 1

## 2017-11-19 MED ORDER — SODIUM CHLORIDE 0.9 % IV SOLN: 250.0000 mL | INTRAVENOUS | Status: DC | PRN

## 2017-11-19 NOTE — Care Management Note (Signed)
Case Management Note  Patient Details  Name: SUNIL HUE MRN: [id number] Date of Birth: 1941/09/06  Subjective/Objective:  Adm with CHF, ARF. From home, active with [facility] and [facility]. Has RW pta. Ind with ADL's. Cardiology consulted.                   Action/Plan: CM following for needs.   Expected Discharge Date:  12/07/17               Expected Discharge Plan:     In-House Referral:     Discharge planning Services  CM Consult  Post Acute Care Choice:    Choice offered to:     DME Arranged:    DME Agency:     HH Arranged:    HH Agency:     Status of Service:  In process, will continue to follow  If discussed at Long Length of Stay Meetings, dates discussed:    Additional Comments:  Lorre Opdahl, Chrystine Oiler, RN 11/19/2017, 1:42 PM

## 2017-11-19 NOTE — Consult Note (Signed)
Consultation Note Date: 11/19/2017   Patient Name: [name]  DOB: 05-May-1941  MRN: [id number]  Age / Sex: 77 y.o., male  PCP: [name], MD Referring Physician: Catarina Hartshorn, MD  Reason for Consultation: Establishing goals of care  HPI/Patient Profile: 77 y.o. male  with past medical history of ischemic cardiomyopathy with an ejection fraction of 20-25%, AICD, history of MI, history of paroxysmal atrial fibrillation, coronary artery disease with stenting, left bundle branch block, anemia chronic, history of TIAs, history of pneumonia, chronic kidney disease stage III, high blood pressure and cholesterol, morbid obesity, cerebral aneurysm, admitted on 11/26/2017 with atrial fibrillation with RVR, acute injury on chronic CKD stage III-IV.   Clinical Assessment and Goals of Care: [name] is resting quietly in bed. There is no family at bedside.  We talked about his troubles last night and coding/CPR.  He tells me that he does not remember any of this, only waking up.  We talked about the concepts of treating the treatable.  [name] states that he feels like others have given up on him.  I share that we are continuing to treat, but his heart and breathing did stop last night.  Conference with hospitalist, Dr. Arbutus Leas related to patient care, health history, treatment plan.  Call to wife, Santos Deloza.  We talked about the current treatment plan, CODE STATUS being rescinded.  Wife Bonita Quin states that what ever her husband decides, is what she wants.  She states both have a living will that states that they would want life support, but not be kept alive on machines.  I encouraged her and [name] to think about length of time.  3 days or 3 weeks?  I share that I will follow-up with him on Monday.  Healthcare power of attorney NEXT OF KIN -Mr. Woodrum names his wife, Bonita Quin and Butta as his Runner, broadcasting/film/video.   He states that he and his wife have 2 sons.   SUMMARY OF RECOMMENDATIONS   At this point, full scope treatment.  Code Status/Advance Care Planning:  Full code -[name] had agreed to "treat the treatable, but allow a natural death".  He has today rescinded DNR and wishes full scope treatment.  Symptom Management:   Per hospitalist, no additional needs at this time.  Palliative Prophylaxis:   Turn Reposition  Additional Recommendations (Limitations, Scope, Preferences):  Full Scope Treatment  Psycho-social/Spiritual:   Desire for further Chaplaincy support:no  Additional Recommendations: Caregiving  Support/Resources  Prognosis:   Unable to determine, based on outcomes.  Discharge Planning: Unable to determine, based on outcomes.      Primary Diagnoses: Present on Admission: . Acute combined systolic and diastolic CHF, NYHA class 4 (HCC) . Cardiomyopathy, ischemic   I have reviewed the medical record, interviewed the patient and family, and examined the patient. The following aspects are pertinent.  Past Medical History:  Diagnosis Date  . AICD (automatic cardioverter/defibrillator) present   . Anemia   . Anemia-chronic    a. Pt reports  h/o anemia - was offered IV iron in past by nephrologist but declined and has taken PO instead.  . Aneurysm, cerebral   . Arthritis    bilateral wrist and fingers  . BPH (benign prostatic hypertrophy)   . Cardiomyopathy, ischemic    LVEF 25-35%.  . Chronic kidney disease, stage III (moderate) (HCC)    Followed by Dr. Fausto Skillern.  . Coronary atherosclerosis of native coronary artery    a. DES to RCA 03/2009. b. s/p PTCA to RCA for ISR, 05/2010. c. 03/2013 NSTEMI 2/2 severe prox RCA s/p PTCA/DES, med rx for residual dz.  . Cystitis 10/2017  . Essential hypertension, benign   . Hyperlipidemia   . Hypothyroidism   . LBBB (left bundle branch block)    s/p BiV ICD Implanted by Dr Graciela Husbands  . Morbid obesity (HCC)   . MRSA  (methicillin resistant Staphylococcus aureus)   . Myocardial infarction (HCC)   . Paroxysmal atrial fibrillation (HCC)    a. Coumadin discontinue in 2010 when the patient required ASA/Plavix for stenting. b. Coumadin restarted 05/2013, Plavix continued, ASA stopped.   . Pneumonia    years ago 67  . Pulmonary nodule    CXR, 03/2013, [facility] - not described on any subsequent chest x-rays however, could be a vascular structure  . TIA (transient ischemic attack)    Multiple TIAs  . Type 2 diabetes mellitus (HCC)    Social History   Socioeconomic History  . Marital status: Married    Spouse name: None  . Number of children: None  . Years of education: None  . Highest education level: None  Social Needs  . Financial resource strain: None  . Food insecurity - worry: None  . Food insecurity - inability: None  . Transportation needs - medical: None  . Transportation needs - non-medical: None  Occupational History  . None  Tobacco Use  . Smoking status: Former Smoker    Packs/day: 1.00    Years: 6.00    Pack years: 6.00    Types: Cigarettes    Start date: 12/08/1949    Last attempt to quit: 11/16/1964    Years since quitting: 53.0  . Smokeless tobacco: Former Neurosurgeon    Quit date: 04/15/1965  Substance and Sexual Activity  . Alcohol use: Yes    Alcohol/week: 0.0 oz    Comment: once/month  . Drug use: No  . Sexual activity: None  Other Topics Concern  . None  Social History Narrative  . None   Family History  Problem Relation Age of Onset  . Diabetes Mother        Bilateral leg amputation  . Heart disease Mother        Before age 76  . Hypertension Mother   . Liver disease Mother        fatty liver   . Cancer Father        Prostate and Bone  . Diabetes Father   . Cancer Brother        Prostate  . Heart disease Other        family h/o premature cardiovascular disease  . Cancer Sister        Colon   . Hypertension Sister    Scheduled Meds: . cholecalciferol  1,000 Units  Oral Daily  . clopidogrel  75 mg Oral Daily  . furosemide  80 mg Intravenous Q12H  . insulin aspart  0-5 Units Subcutaneous QHS  . insulin aspart  0-9 Units Subcutaneous TID  WC  . insulin aspart protamine- aspart  5 Units Subcutaneous BID WC  . l-methylfolate-B6-B12  1 tablet Oral Daily  . levothyroxine  50 mcg Oral QAC breakfast  . pantoprazole  40 mg Oral BID AC  . sodium chloride flush  3 mL Intravenous Q12H  . Warfarin - Pharmacist Dosing Inpatient   Does not apply Q24H  . [START ON 11/20/2017] zinc sulfate  220 mg Oral QHS   Continuous Infusions: . sodium chloride    . amiodarone    . amiodarone    . calcium gluconate 1 GM IV     PRN Meds:.sodium chloride, acetaminophen, HYDROcodone-acetaminophen, nitroGLYCERIN, ondansetron (ZOFRAN) IV, sodium chloride flush, tamsulosin Medications Prior to Admission:  Prior to Admission medications   Medication Sig Start Date End Date Taking? Authorizing Provider  carvedilol (COREG) 6.25 MG tablet Take 3 tablets (18.75 mg total) by mouth 2 (two) times daily with a meal. 11/06/17  Yes Danford, Earl Lites, MD  Cholecalciferol (VITAMIN D3) 10000 units TABS Take 1,000 Units by mouth daily.   Yes [provider]  clopidogrel (PLAVIX) 75 MG tablet Take 1 tablet (75 mg total) by mouth daily. 03/25/17  Yes Allred, Fayrene Fearing, MD  collagenase (SANTYL) ointment Apply 1 application topically daily. Apply to the affected area daily plus dry dressing 11/01/17  Yes Nadara Mustard, MD  hydrALAZINE (APRESOLINE) 10 MG tablet Take 1 tablet (10 mg total) by mouth every 8 (eight) hours. 11/06/17  Yes Danford, Earl Lites, MD  HYDROcodone-acetaminophen (NORCO) 7.5-325 MG tablet Take 1 tablet by mouth every 6 (six) hours as needed for moderate pain. Patient taking differently: Take 1 tablet by mouth daily as needed for moderate pain.  10/15/17  Yes Zannie Cove, MD  insulin lispro protamine-lispro (HUMALOG 75/25 MIX) (75-25) 100 UNIT/ML SUSP injection Inject  10 Units into the skin 2 (two) times daily with a meal. Take 10units BID with meals Patient taking differently: Inject 10 Units into the skin 2 (two) times daily with a meal.  10/15/17  Yes Zannie Cove, MD  isosorbide dinitrate (ISORDIL) 10 MG tablet Take 1 tablet (10 mg total) by mouth 3 (three) times daily. 11/06/17  Yes Danford, Earl Lites, MD  l-methylfolate-B6-B12 (METANX) 3-35-2 MG TABS tablet Take 1 tablet by mouth daily.   Yes [provider]  levothyroxine (SYNTHROID, LEVOTHROID) 50 MCG tablet Take 50 mcg by mouth daily. 06/29/17  Yes [provider]  nitroGLYCERIN (NITROSTAT) 0.4 MG SL tablet Place 0.4 mg under the tongue every 5 (five) minutes as needed for chest pain. For chest pain 04/26/13  Yes Serpe, Clide Deutscher, PA-C  pantoprazole (PROTONIX) 40 MG tablet Take 1 tablet (40 mg total) by mouth 2 (two) times daily before a meal. 11/06/17  Yes Danford, Earl Lites, MD  potassium chloride SA (K-DUR,KLOR-CON) 20 MEQ tablet Take 1 tablet (20 mEq total) by mouth daily. 11/07/17  Yes Danford, Earl Lites, MD  tamsulosin (FLOMAX) 0.4 MG CAPS capsule Take 0.4 mg by mouth daily as needed (bladder).    Yes [provider]  torsemide (DEMADEX) 10 MG tablet Take 1 tablet (10 mg total) by mouth 2 (two) times daily. Patient taking differently: Take 10 mg by mouth 3 (three) times daily.  12/30/15  Yes Jonelle Sidle, MD  warfarin (COUMADIN) 2.5 MG tablet Take 2.5-5 mg by mouth See admin instructions. Pt takes 5mg  by mouth once daily on Mon, Wed, Fri - takes 2.5mg  once daily Tues, Thurs, Sat, Sun   Yes [provider]  protein supplement shake (PREMIER PROTEIN) LIQD Take 325 mLs (11 oz total) by mouth 2 (two) times daily between meals. 11/06/17   Danford, Earl Lites, MD   Allergies  Allergen Reactions  . Allegra [Fexofenadine] Itching  . Crestor [Rosuvastatin Calcium] Other (See Comments)    Body aches  . Codeine Other (See Comments)    blisters, but  able to take hydrocodone   Review of Systems  Unable to perform ROS: Acuity of condition    Physical Exam  Constitutional: He is oriented to person, place, and time. He appears ill.  Morbidly obese, appears chronically ill  HENT:  Head: Normocephalic and atraumatic.  Cardiovascular: Normal rate and regular rhythm.  Tachycardiac times  Pulmonary/Chest: Effort normal. No respiratory distress.  Abdominal: Soft. He exhibits distension.  Obese abdomen  Neurological: He is alert and oriented to person, place, and time.  Skin: Skin is warm and dry.  Psychiatric: His mood appears not anxious.  Nursing note and vitals reviewed.   Vital Signs: BP 95/76   Pulse (!) 123   Temp 97.6 F (36.4 C) (Oral)   Resp 19   Ht 5\' 8"  (1.727 m)   Wt 115 kg (253 lb 8.5 oz)   SpO2 98%   BMI 38.55 kg/m  Pain Assessment: 0-10   Pain Score: 4    SpO2: SpO2: 98 % O2 Device:SpO2: 98 % O2 Flow Rate: .O2 Flow Rate (L/min): 2 L/min  IO: Intake/output summary:   Intake/Output Summary (Last 24 hours) at 11/19/2017 1320 Last data filed at 11/19/2017 1242 Gross per 24 hour  Intake 0 ml  Output 200 ml  Net -200 ml    LBM: Last BM Date: 11/19/17 Baseline Weight: Weight: 116.1 kg (256 lb) Most recent weight: Weight: 115 kg (253 lb 8.5 oz)     Palliative Assessment/Data:   Flowsheet Rows     Most Recent Value  Intake Tab  Referral Department  Hospitalist  Unit at Time of Referral  ICU  Palliative Care Primary Diagnosis  Cardiac  Date Notified  11/19/17  Palliative Care Type  New Palliative care  Reason for referral  Clarify Goals of Care  Date of Admission  11/24/2017  Date first seen by Palliative Care  11/19/17  # of days Palliative referral response time  0 Day(s)  # of days IP prior to Palliative referral  1  Clinical Assessment  Palliative Performance Scale Score  40%  Pain Max last 24 hours  Not able to report  Pain Min Last 24 hours  Not able to report  Dyspnea Max Last 24 Hours  Not  able to report  Dyspnea Min Last 24 hours  Not able to report  Psychosocial & Spiritual Assessment  Palliative Care Outcomes  Patient/Family meeting held?  Yes  Who was at the meeting?  Patient at bedside      Time In: 1005 Time Out: 1040 Time Total: 40 minutes Greater than 50%  of this time was spent counseling and coordinating care related to the above assessment and plan.  Signed by: 01/17/18, NP   Please contact Palliative Medicine Team phone at [phone] for questions and concerns.  For individual provider: See [phone]

## 2017-11-19 NOTE — Progress Notes (Signed)
ANTICOAGULATION CONSULT NOTE   Pharmacy Consult for Warfarin (home med) Indication: atrial fibrillation  Allergies  Allergen Reactions  . Allegra [Fexofenadine] Itching  . Crestor [Rosuvastatin Calcium] Other (See Comments)    Body aches  . Codeine Other (See Comments)    blisters, but able to take hydrocodone    Patient Measurements: Height: 5\' 8"  (172.7 cm) Weight: 253 lb 8.5 oz (115 kg) IBW/kg (Calculated) : 68.4 Heparin Dosing Weight:   Vital Signs: Temp: 97.4 F (36.3 C) (01/04 0724) Temp Source: Oral (01/04 0724) BP: 98/76 (01/04 0500) Pulse Rate: 107 (01/04 0724)  Labs: Recent Labs    12/08/2017 1606 12/08/17 1900 11/19/17 0152  HGB 9.8*  --   --   HCT 31.5*  --   --   PLT 332  --   --   LABPROT  --  40.0* 47.0*  INR  --  4.17* 5.13*  CREATININE 3.73*  --  4.09*  TROPONINI 0.03*  --  0.03*    Estimated Creatinine Clearance: 18.6 mL/min (A) (by C-G formula based on SCr of 4.09 mg/dL (H)).   Medical History: Past Medical History:  Diagnosis Date  . AICD (automatic cardioverter/defibrillator) present   . Anemia   . Anemia-chronic    a. Pt reports h/o anemia - was offered IV iron in past by nephrologist but declined and has taken PO instead.  . Aneurysm, cerebral   . Arthritis    bilateral wrist and fingers  . BPH (benign prostatic hypertrophy)   . Cardiomyopathy, ischemic    LVEF 25-35%.  . Chronic kidney disease, stage III (moderate) (HCC)    Followed by Dr. 01/17/18.  . Coronary atherosclerosis of native coronary artery    a. DES to RCA 03/2009. b. s/p PTCA to RCA for ISR, 05/2010. c. 03/2013 NSTEMI 2/2 severe prox RCA s/p PTCA/DES, med rx for residual dz.  . Cystitis 10/2017  . Essential hypertension, benign   . Hyperlipidemia   . Hypothyroidism   . LBBB (left bundle branch block)    s/p BiV ICD Implanted by Dr 11/2017  . Morbid obesity (HCC)   . MRSA (methicillin resistant Staphylococcus aureus)   . Myocardial infarction (HCC)   . Paroxysmal  atrial fibrillation (HCC)    a. Coumadin discontinue in 2010 when the patient required ASA/Plavix for stenting. b. Coumadin restarted 05/2013, Plavix continued, ASA stopped.   . Pneumonia    years ago 73  . Pulmonary nodule    CXR, 03/2013, [facility] - not described on any subsequent chest x-rays however, could be a vascular structure  . TIA (transient ischemic attack)    Multiple TIAs  . Type 2 diabetes mellitus (HCC)     Medications:  Medications Prior to Admission  Medication Sig Dispense Refill Last Dose  . carvedilol (COREG) 6.25 MG tablet Take 3 tablets (18.75 mg total) by mouth 2 (two) times daily with a meal. 180 tablet 0 2017/12/08 at 630a  . Cholecalciferol (VITAMIN D3) 10000 units TABS Take 1,000 Units by mouth daily.   12-08-2017 at Unknown time  . clopidogrel (PLAVIX) 75 MG tablet Take 1 tablet (75 mg total) by mouth daily. 30 tablet 6 12-08-17 at Unknown time  . collagenase (SANTYL) ointment Apply 1 application topically daily. Apply to the affected area daily plus dry dressing 90 g 3 12-08-17 at Unknown time  . hydrALAZINE (APRESOLINE) 10 MG tablet Take 1 tablet (10 mg total) by mouth every 8 (eight) hours. 90 tablet 0 Dec 08, 2017 at 630a  . HYDROcodone-acetaminophen (  NORCO) 7.5-325 MG tablet Take 1 tablet by mouth every 6 (six) hours as needed for moderate pain. (Patient taking differently: Take 1 tablet by mouth daily as needed for moderate pain. ) 10 tablet 0 unknown  . insulin lispro protamine-lispro (HUMALOG 75/25 MIX) (75-25) 100 UNIT/ML SUSP injection Inject 10 Units into the skin 2 (two) times daily with a meal. Take 10units BID with meals (Patient taking differently: Inject 10 Units into the skin 2 (two) times daily with a meal. )   11/17/2017 at Unknown time  . isosorbide dinitrate (ISORDIL) 10 MG tablet Take 1 tablet (10 mg total) by mouth 3 (three) times daily. 180 tablet 0 Dec 03, 2017 at Unknown time  . l-methylfolate-B6-B12 (METANX) 3-35-2 MG TABS tablet Take 1 tablet by mouth daily.    03-Dec-2017 at Unknown time  . levothyroxine (SYNTHROID, LEVOTHROID) 50 MCG tablet Take 50 mcg by mouth daily.   11/17/2017 at Unknown time  . nitroGLYCERIN (NITROSTAT) 0.4 MG SL tablet Place 0.4 mg under the tongue every 5 (five) minutes as needed for chest pain. For chest pain   unknown  . pantoprazole (PROTONIX) 40 MG tablet Take 1 tablet (40 mg total) by mouth 2 (two) times daily before a meal. 60 tablet 0 03-Dec-2017 at Unknown time  . potassium chloride SA (K-DUR,KLOR-CON) 20 MEQ tablet Take 1 tablet (20 mEq total) by mouth daily. 30 tablet 0 12/03/17 at Unknown time  . tamsulosin (FLOMAX) 0.4 MG CAPS capsule Take 0.4 mg by mouth daily as needed (bladder).    2017/12/03 at morning  . torsemide (DEMADEX) 10 MG tablet Take 1 tablet (10 mg total) by mouth 2 (two) times daily. (Patient taking differently: Take 10 mg by mouth 3 (three) times daily. ) 60 tablet 3 03-Dec-2017 at Unknown time  . warfarin (COUMADIN) 2.5 MG tablet Take 2.5-5 mg by mouth See admin instructions. Pt takes 5mg  by mouth once daily on Mon, Wed, Fri - takes 2.5mg  once daily Tues, Thurs, Sat, Sun   11/17/2017 at 16-1700  . protein supplement shake (PREMIER PROTEIN) LIQD Take 325 mLs (11 oz total) by mouth 2 (two) times daily between meals.  0 Taking    Assessment: 77 yo man on coumadin for afib.  INR remains elevated today  Goal of Therapy:  INR 2-3   Plan:  No Warfarin tonight Daily PT/INR Monitor for signs and symptoms of bleeding.   [name] 11/19/2017,8:34 AM

## 2017-11-19 NOTE — Progress Notes (Addendum)
Case discussed with cardiology, Dr. Wyline Mood.  -Pt now wants to be full code. Changed in Epic -Echo with decreased EF from 10/13/17 and new severe RV dysfunction which likely further supports cardiorenal syndrome and his worsening renal function -anticipate improvement as pt's hemodynamics improve, should he be placed on dobutamine -Dr. Wyline Mood requests transfer to [facility] for Advanced CHF team eval -spoke with colleague, Dr. Maryfrances Bunnell to update him of transfer -spouse and patient updated on transfer to [facility] and agree  -repeat BMP at 1700 although expect K to continue to improve at this point with furosemide.  DTat

## 2017-11-19 NOTE — Progress Notes (Signed)
At approx 0335, pt had ambulated to br with staff and when back to bed became very short of breath and turned to rt side. Pt became unresponsvie, no pulse, no resp. Code blue called and cpr started. Code lasted apprx 3 minutes. ROSC, pt alert, oriented. MD at bedside, new orders for calcium and kayexalate.continue to monitor.

## 2017-11-19 NOTE — Progress Notes (Addendum)
PROGRESS NOTE  [name] BMS:[id number] DOB: 07/22/1941 DOA: 12/14/2017 PCP: [name], MD  Brief History:  77 year old male with a history of ischemic cardiomyopathy, coronary disease, hypertension, persistent atrial fibrillation, recent NSTEMI 10/2017, CKD stage IV, peripheral vascular disease presenting with 15 pound weight gain and increasing shortness of breath.  The patient also has been complaining of increasing lower extremity edema and increasing abdominal girth.  He endorses compliance with his diuretics and fluid restriction.  There is not been any fevers, chills, chest pain, nausea, vomiting, diarrhea, headache.  The patient has had 2 recent prolonged hospitalizations.  Most recently, the patient was hospitalized for 10/23/17 through 11/06/17.  During that hospitalization, the patient was treated for sepsis although the source was unclear.  He was discharged in stable condition without antibiotics.  During that hospitalization he was also intubated for respiratory failure secondary to acute on chronic systolic CHF and NSTEMI.  Prior to that hospitalization, the patient was hospitalized from 09/27/17 through 10/15/17 once again for sepsis secondary to diabetic foot infection.  The patient was also intubated for respiratory failure at that time.  His hospital stay was complicated by a massive GI bleed.  His warfarin was stopped at the time of discharge.  He underwent 2 EGDs which showed clotted blood in the gastric fundus and body.  On the morning of 11/19/2017, CODE BLUE was activated secondary to PEA.  The patient underwent CPR for approximately 3 minutes with ROSC.  Assessment/Plan: Acute on chronic systolic and diastolic CHF -11/06/2017 discharge weight 242 -10/13/2017 echo--EF 25-30, grade 1 DD, mild TR -continue lasix 80 mg IV bid -daily weights -fluid restrict -holding hydralazine and isordil due to soft BP -holding coreg due to decompensation and soft BP  Acute on  chronic renal failure--CKD 4 -Baseline creatinine 2.0-2.4 -Presenting creatinine 3.73 -renal ultrasound -DC potassium supplementation -due to progression of renal disease, concerned about cardiorenal syndrome  Hyperkalemia -Repeat BMP post Kayexalate -Secondary to potassium supplementation which has been discontinued and renal failure -Remain on telemetry  Acute respiratory failure with hypoxia -Presently stable on 3 L -Secondary to pulmonary edema -Wean oxygen for saturation greater than 92%  ICM -EF 25-30% -s/p AICD  Coagulopathy -Hold warfarin -Presenting INR 4.17 -Monitor for signs of bleeding -Dose of vitamin K  Persistent atrial fibrillation -Patient had GI bleed in November 2018 -No signs of bleeding at this time -Holding warfarin secondary to supratherapeutic INR  Hydrocele/Scrotal Edema -10/28/2017 scrotal ultrasound--right hydrocele, small left varicocele without torsion  Peripheral vascular disease -10/12/2017 arteriogram arthrectomy and angioplasty with left SFA and popliteal  -continue Plavix  Disposition Plan:   Remain in ICU Family Communication:   Spouse updated at bedside  Consultants:  Cardiology, renal  Code Status:  DNR  DVT Prophylaxis:  coumadin   Procedures: As Listed in Progress Note Above  Antibiotics: None  Total critical care time 46 min.   Subjective: Patient denies fevers, chills, headache, chest pain, dyspnea, nausea, vomiting, diarrhea, abdominal pain, dysuria, hematuria, hematochezia, and melena.   Objective: Vitals:   11/19/17 0300 11/19/17 0400 11/19/17 0500 11/19/17 0724  BP: (!) 102/52 (!) 88/65 98/76   Pulse:  (!) 169 (!) 104 (!) 107  Resp: (!) 21 19 16 15   Temp:  98.6 F (37 C)  (!) 97.4 F (36.3 C)  TempSrc:  Oral  Oral  SpO2:  99% 100% 100%  Weight:   115 kg (253 lb 8.5 oz)   Height:  Intake/Output Summary (Last 24 hours) at 11/19/2017 0814 Last data filed at 11/19/2017 0600 Gross per 24 hour    Intake 0 ml  Output 50 ml  Net -50 ml   Weight change:  Exam:   General:  Pt is alert, follows commands appropriately, not in acute distress  HEENT: No icterus, No thrush, No neck mass, Lawson/AT  Cardiovascular: IRRR, S1/S2, no rubs, no gallops  Respiratory: bilateral crackles, no wheeze  Abdomen: Soft/+BS, non tender, non distended, no guarding  Extremities: 2 + LE edema, No lymphangitis, No petechiae, No rashes, no synovitis   Data Reviewed: I have personally reviewed following labs and imaging studies Basic Metabolic Panel: Recent Labs  Lab 25-Nov-2017 1606 11/19/17 0152  NA 126* 129*  K 5.4* 6.0*  CL 92* 94*  CO2 17* 18*  GLUCOSE 188* 183*  BUN 120* 127*  CREATININE 3.73* 4.09*  CALCIUM 8.8* 9.0  MG  --  2.3   Liver Function Tests: Recent Labs  Lab 11/19/17 0152  AST 261*  ALT 129*  ALKPHOS 106  BILITOT 1.4*  PROT 7.3  ALBUMIN 2.9*   No results for input(s): LIPASE, AMYLASE in the last 168 hours. No results for input(s): AMMONIA in the last 168 hours. Coagulation Profile: Recent Labs  Lab 25-Nov-2017 1900 11/19/17 0152  INR 4.17* 5.13*   CBC: Recent Labs  Lab Nov 25, 2017 1606  WBC 11.2*  HGB 9.8*  HCT 31.5*  MCV 96.9  PLT 332   Cardiac Enzymes: Recent Labs  Lab 11/25/2017 1606 11/19/17 0152  TROPONINI 0.03* 0.03*   BNP: Invalid input(s): POCBNP CBG: Recent Labs  Lab 11/19/17 0009 11/19/17 0718  GLUCAP 169* 188*   HbA1C: No results for input(s): HGBA1C in the last 72 hours. Urine analysis:    Component Value Date/Time   COLORURINE AMBER (A) 10/12/2017 0450   APPEARANCEUR CLOUDY (A) 10/12/2017 0450   LABSPEC 1.024 10/12/2017 0450   PHURINE 5.0 10/12/2017 0450   GLUCOSEU NEGATIVE 10/12/2017 0450   HGBUR LARGE (A) 10/12/2017 0450   BILIRUBINUR NEGATIVE 10/12/2017 0450   KETONESUR 5 (A) 10/12/2017 0450   PROTEINUR 100 (A) 10/12/2017 0450   UROBILINOGEN 0.2 07/05/2014 2117   NITRITE NEGATIVE 10/12/2017 0450   LEUKOCYTESUR TRACE (A)  10/12/2017 0450   Sepsis Labs: @LABRCNTIP (procalcitonin:4,lacticidven:4) )No results found for this or any previous visit (from the past 240 hour(s)).   Scheduled Meds: . carvedilol  18.75 mg Oral BID WC  . cholecalciferol  1,000 Units Oral Daily  . clopidogrel  75 mg Oral Daily  . furosemide  80 mg Intravenous Q12H  . hydrALAZINE  10 mg Oral Q8H  . insulin aspart  0-15 Units Subcutaneous TID WC  . insulin aspart  0-5 Units Subcutaneous QHS  . insulin aspart protamine- aspart  10 Units Subcutaneous BID WC  . l-methylfolate-B6-B12  1 tablet Oral Daily  . levothyroxine  50 mcg Oral QAC breakfast  . pantoprazole  40 mg Oral BID AC  . sodium chloride flush  3 mL Intravenous Q12H  . sodium polystyrene  45 g Oral Once  . Warfarin - Pharmacist Dosing Inpatient   Does not apply Q24H  . [START ON 11/20/2017] zinc sulfate  220 mg Oral QHS   Continuous Infusions: . sodium chloride    . calcium gluconate 1 GM IV      Procedures/Studies: Dg Chest 2 View  Result Date: 2017-11-25 CLINICAL DATA:  SOB, PT STATES HE JUST GOT OUT OF THE HOSPITAL 10-12 DAYS AGO AND HIS SOB AND  FLUID RETENTION HAS CONTINUED TO WORSEN EXAM: CHEST  2 VIEW COMPARISON:  None. FINDINGS: Bilateral mild interstitial thickening. Small right pleural effusion. No pneumothorax. No focal consolidation. Stable cardiomegaly. Three lead cardiac pacemaker. No acute osseous abnormality. IMPRESSION: Findings concerning for mild CHF. Electronically Signed   By: Elige Ko   On: 12/11/2017 15:28   Nm Bone Scan 3 Phase  Result Date: 10/29/2017 CLINICAL DATA:  Left foot pain and swelling after plantar puncture wound approximately 2 months ago. Evaluate for osteomyelitis. Unable to undergo MRI. EXAM: NUCLEAR MEDICINE 3-PHASE BONE SCAN TECHNIQUE: Radionuclide angiographic images, immediate static blood pool images, and 3-hour delayed static images were obtained of the feet and distal lower legs after intravenous injection of  radiopharmaceutical. RADIOPHARMACEUTICALS:  21.3 mCi Tc-41m MDP COMPARISON:  Left foot radiographs 10/03/2017.  CT 09/28/2017. FINDINGS: Vascular phase: There is asymmetrically increased perfusion to the left foot relative to the right. Blood pool phase: There is prominent asymmetric blood pool activity within the left forefoot medially. Delayed phase: Delayed images demonstrate focal activity within the bases of the first and second proximal phalanges, adjacent to the first web space. There is bilateral midfoot activity associated with the Lisfranc joints, likely arthropathic. IMPRESSION: 1. Abnormal three-phase bone scan with increased perfusion, blood pool and delayed phase activity in the left forefoot. This appears to localize to the first and second proximal phalangeal bases on the delayed images and is suspicious for osteomyelitis. Consider follow-up CT for confirmation. 2. Delayed phase activity at the Lisfranc joint bilaterally, likely arthropathic. Electronically Signed   By: Carey Bullocks M.D.   On: 10/29/2017 16:46   US Scrotum  Result Date: 10/28/2017 CLINICAL DATA:  Bilateral full swelling. EXAM: SCROTAL ULTRASOUND DOPPLER ULTRASOUND OF THE TESTICLES TECHNIQUE: Complete ultrasound examination of the testicles, epididymis, and other scrotal structures was performed. Color and spectral Doppler ultrasound were also utilized to evaluate blood flow to the testicles. COMPARISON:  CT 06/13/2013. FINDINGS: Right testicle Measurements: 4.4 x 2.3 x 2.8 cm. No mass or microlithiasis visualized. Left testicle Measurements: 3.8 x 2.2 x 3.5 cm. No mass or microlithiasis visualized. Right epididymis:  Normal in size and appearance. Left epididymis:  Normal in size and appearance. Hydrocele:  Small right. Varicocele:  Small left. Pulsed Doppler interrogation of both testes demonstrates bilateral color Doppler flow. Diffuse scrotal wall thickening. No scrotal wall fluid collections are noted. IMPRESSION: 1.  Diffuse scrotal wall thickening. 2. Small right hydrocele. Small left varicocele. No evidence of testicular mass or torsion . Electronically Signed   By: Maisie Fus  Register   On: 10/28/2017 15:42   Ct Foot Left Wo Contrast  Result Date: 10/30/2017 CLINICAL DATA:  Diffuse soft tissue swelling. Stepped on a tack, now with subsequent infection. EXAM: CT OF THE LEFT FOOT WITHOUT CONTRAST TECHNIQUE: Multidetector CT imaging of the left foot was performed according to the standard protocol. Multiplanar CT image reconstructions were also generated. COMPARISON:  Radiographs 10/03/2017 FINDINGS: Diffuse and fairly marked subcutaneous soft tissue swelling/ edema/ fluid consistent with cellulitis. No discrete fluid collection to suggest a drainable abscess. Diffuse fatty change involving the foot and ankle musculature but no definite findings for myofasciitis or pyomyositis. The On the sagittal sequence there is a small open wound noted along the plantar aspect of the forefoot at the level of the first metatarsal head. No radiopaque foreign body is identified. No findings suspicious for the septic arthritis or osteomyelitis. Moderate midfoot degenerative changes. There are also cystic changes in the distal calcaneus but no worrisome  bone lesions. IMPRESSION: Diffuse cellulitis without discrete drainable soft tissue abscess or radiopaque foreign body. No definite CT findings for septic arthritis or osteomyelitis. Moderate degenerative changes. Electronically Signed   By: Rudie Meyer M.D.   On: 10/30/2017 15:49   Dg Chest Port 1 View  Result Date: 11/19/2017 CLINICAL DATA:  Post cardiac arrest. EXAM: PORTABLE CHEST 1 VIEW COMPARISON:  Frontal and lateral views yesterday. FINDINGS: Left-sided pacemaker remains in place. Cardiomegaly again seen. Unchanged mediastinal contours. Increased vascular congestion and pulmonary edema. Small bilateral pleural effusions suspected. No pneumothorax. IMPRESSION: Increased vascular  congestion and pulmonary edema. Similar cardiomegaly. Probable small pleural effusions. Electronically Signed   By: Rubye Oaks M.D.   On: 11/19/2017 04:15   Dg Chest Port 1 View  Result Date: 10/27/2017 CLINICAL DATA:  Shortness of breath, respiratory failure, CHF, coronary artery disease, sepsis. EXAM: PORTABLE CHEST 1 VIEW COMPARISON:  Portable chest x-ray of October 25, 2017 FINDINGS: The lungs are adequately inflated. The interstitial markings are increased though stable. The cardiac silhouette is enlarged. The pulmonary vascularity is mildly engorged. There is calcification in the wall of the aortic arch. The ICD is in stable position. The trachea is midline. IMPRESSION: CHF with mild interstitial edema. No alveolar pneumonia nor pleural effusion. Thoracic aortic atherosclerosis. Electronically Signed   By: Joshau Code  [place] M.D.   On: 10/27/2017 07:59   Dg Chest Port 1 View  Result Date: 10/25/2017 CLINICAL DATA:  77 year old male status post septic shock, intubated. EXAM: PORTABLE CHEST 1 VIEW COMPARISON:  10/24/2017 and earlier. FINDINGS: Portable AP semi upright view at 0410 hours. Extubated. Enteric tube removed. Stable right IJ central line. Stable left chest cardiac AICD. Mildly improved lung volumes. Stable to mildly improved ventilation with continued blunting of both costophrenic angles. Stable pulmonary vascularity without overt edema. No pneumothorax. Paucity of bowel gas in the upper abdomen. IMPRESSION: 1. Extubated and enteric tube removed. 2. Mildly improved ventilation with probable small pleural effusions and basilar atelectasis. Electronically Signed   By: Odessa Fleming M.D.   On: 10/25/2017 06:48   Dg Chest Port 1 View  Result Date: 10/24/2017 CLINICAL DATA:  Followup intubated patient. EXAM: PORTABLE CHEST 1 VIEW COMPARISON:  10/23/2017 FINDINGS: Lung volumes are low. There is hazy lung base opacity, left greater than right, likely combination of small effusions and atelectasis.  No pulmonary edema. No pneumothorax. Endotracheal tube, nasal/ orogastric tube, right internal jugular central venous line and left anterior chest wall biventricular cardioverter-defibrillator are stable in well positioned. IMPRESSION: 1. No significant change from the most recent prior study allowing for lower lung volumes on the current exam. 2. Mild, left greater than right, lung base atelectasis with probable small effusions. 3. Support apparatus is well positioned. Electronically Signed   By: Amie Portland M.D.   On: 10/24/2017 07:43   Dg Chest Port 1 View  Result Date: 10/23/2017 CLINICAL DATA:  77 year old male status post intubation. EXAM: PORTABLE CHEST 1 VIEW COMPARISON:  Earlier chest radiograph dated 10/23/2017 FINDINGS: Endotracheal tube with tip above the carina in similar positioning. Right IJ central venous catheter as seen previously. Left lung base densities may represent atelectatic changes. Pneumonia is not excluded. Overall there has been interval increase in the densities at the left lung base compared to the earlier radiograph. A small left pleural effusion is not entirely excluded. There is no pneumothorax. Stable cardiomegaly. Left pectoral AICD device. No acute osseous pathology. IMPRESSION: 1. Endotracheal tube and right IJ central line in similar position. 2. Interval  increase in the left lung base densities which may represent atelectatic changes, although infiltrate is not excluded. A small left pleural effusion may be present. Electronically Signed   By: Elgie Collard M.D.   On: 10/23/2017 06:53   Dg Abd Portable 1v  Result Date: 10/23/2017 CLINICAL DATA:  77 year old male with an orogastric tube in place. Assess tube location. EXAM: PORTABLE ABDOMEN - 1 VIEW COMPARISON:  Prior abdominal radiograph 10/06/2017 FINDINGS: A single radiograph of the upper abdomen is submitted for review. The orogastric tube is in good position projecting over the gastric body. Incompletely  imaged biventricular cardiac rhythm maintenance device. The tip of a venous catheter overlies the mid right atrium. Mild patchy airspace opacity in the left lower lobe may reflect infiltrate or atelectasis. The visualized bowel gas pattern is not obstructed. IMPRESSION: 1. Well-positioned gastric tube projects over the gastric body. 2. Left basilar patchy airspace opacity may reflect atelectasis or infiltrate. Electronically Signed   By: Malachy Moan M.D.   On: 10/23/2017 08:35    Catarina Hartshorn, DO  [facility] Pager [phone]  If 7PM-7AM, please contact night-coverage [email or web address] Password [email or web address] 11/19/2017, 8:14 AM   LOS: 1 day

## 2017-11-19 NOTE — Progress Notes (Signed)
*  PRELIMINARY RESULTS* Echocardiogram 2D Echocardiogram has been performed.  Jeryl Columbia 11/19/2017, 1:54 PM

## 2017-11-19 NOTE — Consult Note (Signed)
Cardiology Consult    Patient ID: [name]; [id number]; [birth year]   Admit date: 2017-11-25 Date of Consult: 11/19/2017  Primary Care Provider: [name], MD Primary Cardiologist: Dr. Diona Browner EP: Dr. Johney Frame  Patient Profile    [name] is a 77 y.o. male with past medical history of chronic combined systolic and diastolic CHF (EF reduced to 25-30% by echo in 09/2017, s/p ICD placement with gen change in 03/2017), ischemic cardiomyopathy, CAD (s/p DES to RCA in 2010, PTCA of RCA in 2011, s/p DES to RCA in 2014), persistent atrial fibrillation (on Coumadin), HTN, HLD, Stage 3 CKD, and LBBB who is being seen today for the evaluation of CHF at the request of Dr. Arbutus Leas.   History of Present Illness    [name] was last examined by Dr. Purvis Sheffield on 11/12/2017 and reported doing well at that time. He denied any recent chest pain but was experiencing intermittent dyspnea. Edema was still present but had significantly improved. An outpatient stress test was recommended at that time for ischemic evaluation in the setting of his reduced EF as it was previously 55-60% in 02/2017. Weight was stable at 244 lbs and he was continued on Torsemide 10mg  BID.   He called the office on 11/25/17 reported having worsening lower extremity edema and weight was up to 256 lbs. He had been taking Torsemide 10mg  TID with minimal improvement in his symptoms. Therefore, he was informed to proceed to the ED for further evaluation.   He reports having worsening abdominal distension and lower extremity edema over the past 2 weeks with associated orthopnea. Denies any chest pain or palpitations.   Initial labs showed WBC of 11.2, Hgb 9.8, platelets 332, Na+ 126, K+ 5.4, and creatinine 3.73 (baseline 2.1 - 2.2, at 2.53 on 11/06/2017). BNP 435. Cyclic troponin values have been flat at 0.03, 0.03, and 0.04. CXR showed increased vascular congestion and pulmonary edema with small pleural effusions.   He was therefore  admitted for further observation. Earlier this morning around 0335, CODE BLUE was called as he became short of breath with ambulating to the restroom and was unresponsive with no pulse, consistent with PEA arrest. CPR was started with ROSC within 3 minutes.   He has been started on IV Lasix 80mg  BID with minimal urine output thus far. Creatinine has continued to trend upwards to 4.05.  Past Medical History:  Diagnosis Date  . AICD (automatic cardioverter/defibrillator) present   . Anemia   . Anemia-chronic    a. Pt reports h/o anemia - was offered IV iron in past by nephrologist but declined and has taken PO instead.  . Aneurysm, cerebral   . Arthritis    bilateral wrist and fingers  . BPH (benign prostatic hypertrophy)   . Cardiomyopathy, ischemic    LVEF 25-35%.  . Chronic kidney disease, stage III (moderate) (HCC)    Followed by Dr. .  . Coronary atherosclerosis of native coronary artery    a. DES to RCA 03/2009. b. s/p PTCA to RCA for ISR, 05/2010. c. 03/2013 NSTEMI 2/2 severe prox RCA s/p PTCA/DES, med rx for residual dz.  . Cystitis 10/2017  . Essential hypertension, benign   . Hyperlipidemia   . Hypothyroidism   . LBBB (left bundle Aavya Shafer block)    s/p BiV ICD Implanted by Dr 06/2010  . Morbid obesity (HCC)   . MRSA (methicillin resistant Staphylococcus aureus)   . Myocardial infarction (HCC)   . Paroxysmal atrial fibrillation (HCC)  a. Coumadin discontinue in 2010 when the patient required ASA/Plavix for stenting. b. Coumadin restarted 05/2013, Plavix continued, ASA stopped.   . Pneumonia    years ago 57  . Pulmonary nodule    CXR, 03/2013, [facility] - not described on any subsequent chest x-rays however, could be a vascular structure  . TIA (transient ischemic attack)    Multiple TIAs  . Type 2 diabetes mellitus (HCC)     Past Surgical History:  Procedure Laterality Date  . BIV ICD GENERATOR CHANGEOUT N/A 03/18/2017   Medtronic [place] MRI conditional CRT D device  implated by Dr Johney Frame  . CHOLECYSTECTOMY    . ESOPHAGOGASTRODUODENOSCOPY N/A 10/06/2017   Procedure: ESOPHAGOGASTRODUODENOSCOPY (EGD);  Surgeon: Kerin Salen, MD;  Location: [facility];  Service: Gastroenterology;  Laterality: N/A;  . ESOPHAGOGASTRODUODENOSCOPY N/A 10/07/2017   Procedure: ESOPHAGOGASTRODUODENOSCOPY (EGD);  Surgeon: Kathi Der, MD;  Location: [facility];  Service: Gastroenterology;  Laterality: N/A;  . ESOPHAGOGASTRODUODENOSCOPY (EGD) WITH PROPOFOL N/A 10/05/2017   Procedure: ESOPHAGOGASTRODUODENOSCOPY (EGD) WITH PROPOFOL;  Surgeon: West Bali, MD;  Location: [facility];  Service: Endoscopy;  Laterality: N/A;  has ICD, just FYI  . ICD placement  05/06/2011   Medtronic CRT-D  . LEFT HEART CATHETERIZATION WITH CORONARY ANGIOGRAM N/A 04/17/2013   Procedure: LEFT HEART CATHETERIZATION WITH CORONARY ANGIOGRAM;  Surgeon: Vesta Mixer, MD;  Location: [facility] CATH LAB;  Service: Cardiovascular;  Laterality: N/A;  . LOWER EXTREMITY ANGIOGRAPHY Left 10/12/2017   Procedure: Lower Extremity Angiography;  Surgeon: Nada Libman, MD;  Location: [facility] INVASIVE CV LAB;  Service: Cardiovascular;  Laterality: Left;  . LUNG SURGERY     24 - 25 yrs. old  . PERIPHERAL VASCULAR ATHERECTOMY Left 10/12/2017   Procedure: PERIPHERAL VASCULAR ATHERECTOMY;  Surgeon: Nada Libman, MD;  Location: [facility] INVASIVE CV LAB;  Service: Cardiovascular;  Laterality: Left;  SFA/POPLITEAL  . TOTAL KNEE ARTHROPLASTY    . VASECTOMY       Home Medications:  Prior to Admission medications   Medication Sig Start Date End Date Taking? Authorizing Provider  carvedilol (COREG) 6.25 MG tablet Take 3 tablets (18.75 mg total) by mouth 2 (two) times daily with a meal. 11/06/17  Yes Danford, Earl Lites, MD  Cholecalciferol (VITAMIN D3) 10000 units TABS Take 1,000 Units by mouth daily.   Yes [provider]  clopidogrel (PLAVIX) 75 MG tablet Take 1 tablet (75 mg total) by mouth daily. 03/25/17  Yes  Allred, Fayrene Fearing, MD  collagenase (SANTYL) ointment Apply 1 application topically daily. Apply to the affected area daily plus dry dressing 11/01/17  Yes Nadara Mustard, MD  hydrALAZINE (APRESOLINE) 10 MG tablet Take 1 tablet (10 mg total) by mouth every 8 (eight) hours. 11/06/17  Yes Danford, Earl Lites, MD  HYDROcodone-acetaminophen (NORCO) 7.5-325 MG tablet Take 1 tablet by mouth every 6 (six) hours as needed for moderate pain. Patient taking differently: Take 1 tablet by mouth daily as needed for moderate pain.  10/15/17  Yes Zannie Cove, MD  insulin lispro protamine-lispro (HUMALOG 75/25 MIX) (75-25) 100 UNIT/ML SUSP injection Inject 10 Units into the skin 2 (two) times daily with a meal. Take 10units BID with meals Patient taking differently: Inject 10 Units into the skin 2 (two) times daily with a meal.  10/15/17  Yes Zannie Cove, MD  isosorbide dinitrate (ISORDIL) 10 MG tablet Take 1 tablet (10 mg total) by mouth 3 (three) times daily. 11/06/17  Yes Danford, Earl Lites, MD  l-methylfolate-B6-B12 (METANX) 3-35-2 MG TABS tablet  Take 1 tablet by mouth daily.   Yes [provider]  levothyroxine (SYNTHROID, LEVOTHROID) 50 MCG tablet Take 50 mcg by mouth daily. 06/29/17  Yes [provider]  nitroGLYCERIN (NITROSTAT) 0.4 MG SL tablet Place 0.4 mg under the tongue every 5 (five) minutes as needed for chest pain. For chest pain 04/26/13  Yes Serpe, Clide Deutscher, PA-C  pantoprazole (PROTONIX) 40 MG tablet Take 1 tablet (40 mg total) by mouth 2 (two) times daily before a meal. 11/06/17  Yes Danford, Earl Lites, MD  potassium chloride SA (K-DUR,KLOR-CON) 20 MEQ tablet Take 1 tablet (20 mEq total) by mouth daily. 11/07/17  Yes Danford, Earl Lites, MD  tamsulosin (FLOMAX) 0.4 MG CAPS capsule Take 0.4 mg by mouth daily as needed (bladder).    Yes [provider]  torsemide (DEMADEX) 10 MG tablet Take 1 tablet (10 mg total) by mouth 2 (two) times daily. Patient taking  differently: Take 10 mg by mouth 3 (three) times daily.  12/30/15  Yes Jonelle Sidle, MD  warfarin (COUMADIN) 2.5 MG tablet Take 2.5-5 mg by mouth See admin instructions. Pt takes 5mg  by mouth once daily on Mon, Wed, Fri - takes 2.5mg  once daily Tues, Thurs, Sat, Sun   Yes [provider]  protein supplement shake (PREMIER PROTEIN) LIQD Take 325 mLs (11 oz total) by mouth 2 (two) times daily between meals. 11/06/17   Danford, Earl Lites, MD    Inpatient Medications: Scheduled Meds: . cholecalciferol  1,000 Units Oral Daily  . clopidogrel  75 mg Oral Daily  . furosemide  80 mg Intravenous Q12H  . insulin aspart  0-5 Units Subcutaneous QHS  . insulin aspart  0-9 Units Subcutaneous TID WC  . insulin aspart protamine- aspart  5 Units Subcutaneous BID WC  . l-methylfolate-B6-B12  1 tablet Oral Daily  . levothyroxine  50 mcg Oral QAC breakfast  . pantoprazole  40 mg Oral BID AC  . sodium chloride flush  3 mL Intravenous Q12H  . Warfarin - Pharmacist Dosing Inpatient   Does not apply Q24H  . [START ON 11/20/2017] zinc sulfate  220 mg Oral QHS   Continuous Infusions: . sodium chloride    . calcium gluconate 1 GM IV     PRN Meds: sodium chloride, acetaminophen, HYDROcodone-acetaminophen, nitroGLYCERIN, ondansetron (ZOFRAN) IV, sodium chloride flush, tamsulosin  Allergies:    Allergies  Allergen Reactions  . Allegra [Fexofenadine] Itching  . Crestor [Rosuvastatin Calcium] Other (See Comments)    Body aches  . Codeine Other (See Comments)    blisters, but able to take hydrocodone    Social History:   Social History   Socioeconomic History  . Marital status: Married    Spouse name: Not on file  . Number of children: Not on file  . Years of education: Not on file  . Highest education level: Not on file  Social Needs  . Financial resource strain: Not on file  . Food insecurity - worry: Not on file  . Food insecurity - inability: Not on file  . Transportation needs -  medical: Not on file  . Transportation needs - non-medical: Not on file  Occupational History  . Not on file  Tobacco Use  . Smoking status: Former Smoker    Packs/day: 1.00    Years: 6.00    Pack years: 6.00    Types: Cigarettes    Start date: 12/08/1949    Last attempt to quit: 11/16/1964    Years since quitting: 53.0  .  Smokeless tobacco: Former Neurosurgeon    Quit date: 04/15/1965  Substance and Sexual Activity  . Alcohol use: Yes    Alcohol/week: 0.0 oz    Comment: once/month  . Drug use: No  . Sexual activity: Not on file  Other Topics Concern  . Not on file  Social History Narrative  . Not on file     Family History:    Family History  Problem Relation Age of Onset  . Diabetes Mother        Bilateral leg amputation  . Heart disease Mother        Before age 13  . Hypertension Mother   . Liver disease Mother        fatty liver   . Cancer Father        Prostate and Bone  . Diabetes Father   . Cancer Brother        Prostate  . Heart disease Other        family h/o premature cardiovascular disease  . Cancer Sister        Colon   . Hypertension Sister       Review of Systems    General:  No chills, fever, night sweats or weight changes.  Cardiovascular:  No chest pain, palpitations, paroxysmal nocturnal dyspnea. Positive for dyspnea on exertion, edema, and orthopnea.  Dermatological: No rash, lesions/masses Respiratory: No cough, dyspnea Urologic: No hematuria, dysuria Abdominal:   No nausea, vomiting, diarrhea, bright red blood per rectum, melena, or hematemesis Neurologic:  No visual changes, wkns, changes in mental status. All other systems reviewed and are otherwise negative except as noted above.  Physical Exam/Data    Vitals:   11/19/17 1015 11/19/17 1030 11/19/17 1045 11/19/17 1125  BP: 97/77 (!) 85/74 102/73   Pulse: 91 (!) 114 (!) 124 (!) 125  Resp: 13 13 15 16   Temp:    97.6 F (36.4 C)  TempSrc:    Oral  SpO2: 98% 98% 100% 100%  Weight:        Height:        Intake/Output Summary (Last 24 hours) at 11/19/2017 1144 Last data filed at 11/19/2017 0600 Gross per 24 hour  Intake 0 ml  Output 50 ml  Net -50 ml   Filed Weights   12/12/2017 1454 11/19/17 0500  Weight: 256 lb (116.1 kg) 253 lb 8.5 oz (115 kg)   Body mass index is 38.55 kg/m.   General: Pleasant, obese Caucasian male appearing in NAD Psych: Normal affect. Neuro: Alert and oriented X 3. Moves all extremities spontaneously. HEENT: Normal  Neck: Supple without bruits. JVD elevated. Lungs:  Resp regular and unlabored, rales along bases bilaterally. Heart: Irregularly irregular, no s3, s4, or murmurs. Abdomen: Soft, non-tender, BS + x 4. Appears distended.   Extremities: No clubbing or cyanosis. 2+ pitting edema up to thighs bilaterally. DP/PT/Radials 2+ and equal bilaterally.   EKG:  The EKG was personally reviewed and demonstrates: Atrial fibrillation, HR 121. Known LBBB.    Labs/Studies     Relevant CV Studies:  Echocardiogram: 10/13/2017 Study Conclusions  - Left ventricle: Diffuse hypokinesis worse in inferior wall and   abnormal septal motion The cavity size was moderately dilated.   Wall thickness was increased in a pattern of severe LVH. Systolic   function was severely reduced. The estimated ejection fraction   was in the range of 25% to 30%. Doppler parameters are consistent   with abnormal left ventricular relaxation (grade 1 diastolic  dysfunction). - Mitral valve: Calcified annulus. Mildly thickened leaflets .   There was mild regurgitation. - Atrial septum: No defect or patent foramen ovale was identified.  Laboratory Data:  Chemistry Recent Labs  Lab 21-Nov-2017 1606 11/19/17 0152 11/19/17 0953  NA 126* 129* 129*  K 5.4* 6.0* 5.8*  CL 92* 94* 96*  CO2 17* 18* 14*  GLUCOSE 188* 183* 202*  BUN 120* 127* 130*  CREATININE 3.73* 4.09* 4.05*  CALCIUM 8.8* 9.0 9.0  GFRNONAA 14* 13* 13*  GFRAA 17* 15* 15*  ANIONGAP 17* 17* 19*     Recent Labs  Lab 11/19/17 0152  PROT 7.3  ALBUMIN 2.9*  AST 261*  ALT 129*  ALKPHOS 106  BILITOT 1.4*   Hematology Recent Labs  Lab 12/10/2017 1606  WBC 11.2*  RBC 3.25*  HGB 9.8*  HCT 31.5*  MCV 96.9  MCH 30.2  MCHC 31.1  RDW 16.9*  PLT 332   Cardiac Enzymes Recent Labs  Lab 12/04/2017 1606 11/19/17 0152 11/19/17 0750  TROPONINI 0.03* 0.03* 0.04*   No results for input(s): TROPIPOC in the last 168 hours.  BNP Recent Labs  Lab Nov 21, 2017 1606  BNP 435.0*    DDimer No results for input(s): DDIMER in the last 168 hours.  Radiology/Studies:  Dg Chest 2 View  Result Date: 11/16/2017 CLINICAL DATA:  SOB, PT STATES HE JUST GOT OUT OF THE HOSPITAL 10-12 DAYS AGO AND HIS SOB AND FLUID RETENTION HAS CONTINUED TO WORSEN EXAM: CHEST  2 VIEW COMPARISON:  None. FINDINGS: Bilateral mild interstitial thickening. Small right pleural effusion. No pneumothorax. No focal consolidation. Stable cardiomegaly. Three lead cardiac pacemaker. No acute osseous abnormality. IMPRESSION: Findings concerning for mild CHF. Electronically Signed   By: Elige Ko   On: 12/12/2017 15:28   Dg Chest Port 1 View  Result Date: 11/19/2017 CLINICAL DATA:  Post cardiac arrest. EXAM: PORTABLE CHEST 1 VIEW COMPARISON:  Frontal and lateral views yesterday. FINDINGS: Left-sided pacemaker remains in place. Cardiomegaly again seen. Unchanged mediastinal contours. Increased vascular congestion and pulmonary edema. Small bilateral pleural effusions suspected. No pneumothorax. IMPRESSION: Increased vascular congestion and pulmonary edema. Similar cardiomegaly. Probable small pleural effusions. Electronically Signed   By: Rubye Oaks M.D.   On: 11/19/2017 04:15     Assessment & Plan    1. Acute on Chronic Combined Systolic and Diastolic CHF - EF was previously 55-60% in 02/2017, at 25-30% by echo in 09/2017. Over the past few weeks, he has experienced worsening dyspnea, orthopnea, lower extremity edema and  abdominal distension with over a 12 lb weight gain (244 --> 256 lbs). Repots minimal urine output at home.  - Inital labs showed WBC of 11.2, Hgb 9.8, platelets 332, Na+ 126, K+ 5.4, and creatinine 3.73 (baseline 2.1 - 2.2, at 2.53 on 11/06/2017). BNP 435. Cyclic troponin values have been flat at 0.03, 0.03, and 0.04. CXR consistent with CHF. Had a PEA arrest overnight requiring CPR with ROSC within 3 minutes. No recurrent events since. ICD did not fire.  - Was started on IV Lasix 80mg  BID with minimal urine output thus far and creatinine is now elevated to 4.05. Recommend Nephrology consult for assistance with diuresis. Consider obtaining Co-ox as PICC was placed earlier this morning.   2. Ischemic Cardiomyopathy - s/p Medtronic CRT-D placement. Followed by Dr. Johney Frame.   3. CAD - s/p DES to RCA in 2010, PTCA of RCA in 2011, s/p DES to RCA in 2014.  - he has baseline dyspnea on exertion.  Did experience chest pain following CPR he required during PEA arrest overnight. Denies any chest pain prior to his.  - continue Plavix. BB held in setting of acute CHF exacerbation. Plan was for outpatient NST in the coming weeks for ischemic evaluation in the setting of his reduced EF. Not a candidate for invasive options at this point given his AKI.   4. Persistent atrial fibrillation - HR remains elevated in the 110's to 120's on telemetry. PTA Coreg was held in the setting of his acute CHF exacerbation and hypotension. Recommend PRN IV Lopressor for rate-control as his elevated rates are likely playing a contributing factor in his CHF exacerbation.  - Coumadin being followed by Pharmacy. Currently held as INR is elevated to 5.13.  5. LBBB - chronic. Noted on prior tracings.   6. Acute on Chronic Stage 3-4 CKD - creatinine 3.73 on admission (baseline 2.1 - 2.2, at 2.53 on 11/06/2017). Creatinine has continued to trend upwards to 4.05 this morning.  - Nephrology has been consulted.   7. Code Status - the  patient was Full Code at the time of admission but made himself DNR this morning. At the time of this encounter, he is A&Ox3 and is very clear that he wishes to be Full Code. Will update this in Epic. Patient's nurse made aware of these changes.     For questions or updates, please contact [facility] Please consult [email or web address] for contact info under Cardiology/STEMI.  Signed, Ellsworth Lennox, PA-C 11/19/2017, 11:44 AM Pager: [phone]   Patient seen and discussed with PA Iran Ouch, I agree with her documentation. 77 yo male history of ICM with prior LVEF as lowe as 25% then normalized however most recently LVEF 25-30% 09/2017,  Medtronic biV ICD followed by EP, CAD with prior interventions, HTN, PAF, DM2, prior TIAs, admitted with SOB and weight gain, he reports 15 lbs at home. States he just stopped making much urine, reports compliance with meds.   Recent drop in LVEF during recent admission, plans were for outpatient nuclear stress. Renal function as well as recent GI bleed limited cath. Of note at the time he had uncontrolled afib for quite some time, thought possible tachycardia mediated CM.  In ER found to be in afib with RVR.   Statin intolerant, no ACE/ARB/ARNI/aldactone due to poor renal function.   At 330 patient with PEA arrest after walking from bathrrom. Nursing staff reports to me PEA arrest lasting about 3 minutes. At that time K was up to 6, this was treated.   Na 126, K 5.4, Cr 3.73 (up from 2.5), BUN 120, WBC 11.2, Hgb 9.8, Plt 332, BNP 435, INR 4.17 Trop 0.03-->0.03-->0.04 CXR +pulmonary edema EKG Afib, LBBB chronic  PEA arrest early this AM around 330AM, 3 minutes of resuscitation. Post event troponins x 1 4 hrs after unremarkable follow trend, EKG is chronic LBBB and cannot interpret as ischemia. Patient denies any chest pain.  Given PEA arrest and lack of trop change unlikely ischemic event, perhaps related to significant hyperkalemia which remains elevated  today, management per primary team. He has received ca gluconate and kayexelate. Given  worsening renal failure, hyperkalemia, metabolic acidosis would recommend renal consultation. Liver enzymes elevated as well, from timing standpoint this was prior to arrest, possible venous congestion related to CHF.   Negative just this admission he is on lasix 80mg  IV bid. Increase to lasix 80mg  IV tid, he is clearly volume overloaded. PICC line in place, check co-ox and CVP.  We will repeat echo and f/u co-ox, I am concerned about further decrease in LVEF given his presentation with fluid overload, soft bp's, renal failure, minimal diuresisi with IV lasix, and elevated transaminases. Pending results may require inotoropic support. Afib rates elevated, limited options with soft bp's. Start amio gtt without bolus, continue to monitor liver enzymes. Hold anticoag since supratherapeutic INR.   Dina Rich MD

## 2017-11-19 NOTE — Consult Note (Addendum)
[name] MRN: [id number] DOB/AGE: 1941/10/03 77 y.o. Primary Care Physician:Shah, Beatrix Fetters, MD Admit date: 11/26/2017 Chief Complaint:  Chief Complaint  Patient presents with  . Shortness of Breath   HPI: Pt is a 77 year old male with past medical  history of ischemic cardiomyopathy, coronary disease, recent NSTEMI 10/2017, CKD stage IV who came to ER with c/o shortness of breath.  HPI dates back to past 2 weeks when pt started having shortnes of breath, it was progressive. Pt gave hx of   15 pound weight gain since discharge. Patient also complained  of increasing lower extremity edema and increasing abdominal girth.  Pt offers no c/o fevers/ chills. NO c/o   chest pain. NO c/o  Nausea/ vomiting. No c/o  Diarrhea.  The patient has had multiple  hospitalizations. In November Patient was also hospitalized from 09/27/17 through 10/15/17  for sepsis secondary to diabetic foot infection.  The patient waso intubated for respiratory failure at that time as well.  His hospital stay was complicated by a massive GI bleed.  His warfarin was stopped at the time of discharge.  He underwent 2 EGDs which showed clotted blood in the gastric fundus and body.  In December   Pt  was hospitalized for 10/23/17 through 11/06/17. Patient was treated for sepsis . During that hospitalization he was also intubated for respiratory failure secondary to acute on chronic systolic CHF and NSTEMI.   Now in January  On the morning of 11/19/2017, CODE BLUE was activated secondary to PEA.  The patient underwent CPR for approximately 3 minutes with ROSC.  Pt seen today in ICU. Pt says " I am feeling better, just sore at my chest" . Pt says " I am waiting to go to [facility]" NO c/o nausea/vomitingNO c/o abdominal pain NO c/o change in speech/vision.   Past Medical History:  Diagnosis Date  . AICD (automatic cardioverter/defibrillator) present   . Anemia   . Anemia-chronic    a. Pt reports h/o anemia - was offered  IV iron in past by nephrologist but declined and has taken PO instead.  . Aneurysm, cerebral   . Arthritis    bilateral wrist and fingers  . BPH (benign prostatic hypertrophy)   . Cardiomyopathy, ischemic    LVEF 25-35%.  . Chronic kidney disease, stage III (moderate) (HCC)    Followed by Dr. Fausto Skillern.  . Coronary atherosclerosis of native coronary artery    a. DES to RCA 03/2009. b. s/p PTCA to RCA for ISR, 05/2010. c. 03/2013 NSTEMI 2/2 severe prox RCA s/p PTCA/DES, med rx for residual dz.  . Cystitis 10/2017  . Essential hypertension, benign   . Hyperlipidemia   . Hypothyroidism   . LBBB (left bundle branch block)    s/p BiV ICD Implanted by Dr Graciela Husbands  . Morbid obesity (HCC)   . MRSA (methicillin resistant Staphylococcus aureus)   . Myocardial infarction (HCC)   . Paroxysmal atrial fibrillation (HCC)    a. Coumadin discontinue in 2010 when the patient required ASA/Plavix for stenting. b. Coumadin restarted 05/2013, Plavix continued, ASA stopped.   . Pneumonia    years ago 71  . Pulmonary nodule    CXR, 03/2013, [facility] - not described on any subsequent chest x-rays however, could be a vascular structure  . TIA (transient ischemic attack)    Multiple TIAs  . Type 2 diabetes mellitus (HCC)         Family History  Problem Relation Age of Onset  .  Diabetes Mother        Bilateral leg amputation  . Heart disease Mother        Before age 80  . Hypertension Mother   . Liver disease Mother        fatty liver   . Cancer Father        Prostate and Bone  . Diabetes Father   . Cancer Brother        Prostate  . Heart disease Other        family h/o premature cardiovascular disease  . Cancer Sister        Colon   . Hypertension Sister     Social History:  reports that he quit smoking about 53 years ago. His smoking use included cigarettes. He started smoking about 67 years ago. He has a 6.00 pack-year smoking history. He quit smokeless tobacco use about 52 years ago. He reports  that he drinks alcohol. He reports that he does not use drugs.   Allergies:  Allergies  Allergen Reactions  . Allegra [Fexofenadine] Itching  . Crestor [Rosuvastatin Calcium] Other (See Comments)    Body aches  . Codeine Other (See Comments)    blisters, but able to take hydrocodone    Medications Prior to Admission  Medication Sig Dispense Refill  . carvedilol (COREG) 6.25 MG tablet Take 3 tablets (18.75 mg total) by mouth 2 (two) times daily with a meal. 180 tablet 0  . Cholecalciferol (VITAMIN D3) 10000 units TABS Take 1,000 Units by mouth daily.    . clopidogrel (PLAVIX) 75 MG tablet Take 1 tablet (75 mg total) by mouth daily. 30 tablet 6  . collagenase (SANTYL) ointment Apply 1 application topically daily. Apply to the affected area daily plus dry dressing 90 g 3  . hydrALAZINE (APRESOLINE) 10 MG tablet Take 1 tablet (10 mg total) by mouth every 8 (eight) hours. 90 tablet 0  . HYDROcodone-acetaminophen (NORCO) 7.5-325 MG tablet Take 1 tablet by mouth every 6 (six) hours as needed for moderate pain. (Patient taking differently: Take 1 tablet by mouth daily as needed for moderate pain. ) 10 tablet 0  . insulin lispro protamine-lispro (HUMALOG 75/25 MIX) (75-25) 100 UNIT/ML SUSP injection Inject 10 Units into the skin 2 (two) times daily with a meal. Take 10units BID with meals (Patient taking differently: Inject 10 Units into the skin 2 (two) times daily with a meal. )    . isosorbide dinitrate (ISORDIL) 10 MG tablet Take 1 tablet (10 mg total) by mouth 3 (three) times daily. 180 tablet 0  . l-methylfolate-B6-B12 (METANX) 3-35-2 MG TABS tablet Take 1 tablet by mouth daily.    Marland Kitchen levothyroxine (SYNTHROID, LEVOTHROID) 50 MCG tablet Take 50 mcg by mouth daily.    . nitroGLYCERIN (NITROSTAT) 0.4 MG SL tablet Place 0.4 mg under the tongue every 5 (five) minutes as needed for chest pain. For chest pain    . pantoprazole (PROTONIX) 40 MG tablet Take 1 tablet (40 mg total) by mouth 2 (two) times  daily before a meal. 60 tablet 0  . potassium chloride SA (K-DUR,KLOR-CON) 20 MEQ tablet Take 1 tablet (20 mEq total) by mouth daily. 30 tablet 0  . tamsulosin (FLOMAX) 0.4 MG CAPS capsule Take 0.4 mg by mouth daily as needed (bladder).     . torsemide (DEMADEX) 10 MG tablet Take 1 tablet (10 mg total) by mouth 2 (two) times daily. (Patient taking differently: Take 10 mg by mouth 3 (three) times daily. ) 60  tablet 3  . warfarin (COUMADIN) 2.5 MG tablet Take 2.5-5 mg by mouth See admin instructions. Pt takes 5mg  by mouth once daily on Mon, Wed, Fri - takes 2.5mg  once daily Tues, Thurs, Sat, Sun    . protein supplement shake (PREMIER PROTEIN) LIQD Take 325 mLs (11 oz total) by mouth 2 (two) times daily between meals.  0       YOK:[id number] from the symptoms mentioned above,there are no other symptoms referable to all systems reviewed.  . cholecalciferol  1,000 Units Oral Daily  . clopidogrel  75 mg Oral Daily  . furosemide  80 mg Intravenous TID  . insulin aspart  0-5 Units Subcutaneous QHS  . insulin aspart  0-9 Units Subcutaneous TID WC  . insulin aspart protamine- aspart  5 Units Subcutaneous BID WC  . l-methylfolate-B6-B12  1 tablet Oral Daily  . levothyroxine  50 mcg Oral QAC breakfast  . pantoprazole  40 mg Oral BID AC  . sodium chloride flush  3 mL Intravenous Q12H  . Warfarin - Pharmacist Dosing Inpatient   Does not apply Q24H  . [START ON 11/20/2017] zinc sulfate  220 mg Oral QHS       Physical Exam: Vital signs in last 24 hours: Temp:  [97.4 F (36.3 C)-98.6 F (37 C)] 97.6 F (36.4 C) (01/04 1125) Pulse Rate:  [55-169] 102 (01/04 1545) Resp:  [13-31] 15 (01/04 1545) BP: (77-130)/(33-104) 108/94 (01/04 1545) SpO2:  [94 %-100 %] 100 % (01/04 1545) Weight:  [253 lb 8.5 oz (115 kg)] 253 lb 8.5 oz (115 kg) (01/04 0500) Weight change:  Last BM Date: 11/19/17  Intake/Output from previous day: 01/03 0701 - 01/04 0700 In: 0  Out: 50 [Urine:50] Total I/O In: 10  [I.V.:10] Out: 150 [Urine:150]   Physical Exam: General- pt is awake,alert, oriented to time place and person Resp-mild REsp distress,  Rhonchi+ CVS- S1S2 regular in rate and rhythm GIT- BS+, soft, NT, ND EXT- 2+ LE Edema, NO Cyanosis           Left foot wound +  CNS- CN 2-12 grossly intact. Moving all 4 extremities Psych- normal mood and affect    Lab Results: CBC Recent Labs    11/29/2017 1606  WBC 11.2*  HGB 9.8*  HCT 31.5*  PLT 332    BMET Recent Labs    11/19/17 0953 11/19/17 1344  NA 129* 128*  K 5.8* 5.2*  CL 96* 95*  CO2 14* 19*  GLUCOSE 202* 242*  BUN 130* 136*  CREATININE 4.05* 4.02*  CALCIUM 9.0 8.9    Creat 2019 3.7=> 4.0 2018 1.6--2.5 2.0--2.5( December admission) 1.6--2.4( November admission) 2015  1.8 2014  1.5--2.3 ( AKI)   Potassium 6.0=> 5.2   Lab Results  Component Value Date   CALCIUM 8.9 11/19/2017   CAION 1.22 04/14/2013   PHOS 3.3 10/27/2017      Impression: 1)Renal  AKI secondary to ATN/Cardiorenal               AKI sec to Cardiorenal/Code blue               AKI on CKD               CKD stage 4.               CKD since 2014               CKD secondary to Cardiorenal/DM/HTN  Progression of CKD marked with multiple AKI                Hematuria none.                Nephrolithiasis Hx Absent                 AKI                NON  Oliguria ATN                Creat at plateu                Urine outpt better tan last night.                BUN high-? GI bleed  2)HTN  Medication- On Diuretics On Beta blockers    3)Anemia HGb at goal (9--11) Will ask for anemia work up   4)CKD Mineral-Bone Disorder PTH not avail . Secondary Hyperparathyroidism w/u pending . Phosphorus at goal.   5)[facility]- admitted with CHF exacerbation.       S/P Code sec to PEA        Afib          On Amiodarone        Cardiology and  Primary MD following          Now awaiting [facility].  6)Electrolytes Hyperkalemic    Sec to AKI+ CKD    Now betetr  Hyponatremic   Sec to Hypervolemia + CKD+ AKI  7)Acid base Co2 not  at goal 14=> 19 better    Plan:  Will ask for FOBT as BUN much higher than expected-? GI bleed Will ask for FENA Will ask for Renal u/s  I had extensive discussion with pt and family about the kidney related issues.       Jodette Wik S 11/19/2017, 4:35 PM

## 2017-11-20 DIAGNOSIS — J9601 Acute respiratory failure with hypoxia: Secondary | ICD-10-CM

## 2017-11-20 DIAGNOSIS — I5041 Acute combined systolic (congestive) and diastolic (congestive) heart failure: Secondary | ICD-10-CM

## 2017-11-20 DIAGNOSIS — K921 Melena: Secondary | ICD-10-CM

## 2017-11-20 LAB — COOXEMETRY PANEL
Carboxyhemoglobin: 1.3 % (ref 0.5–1.5)
METHEMOGLOBIN: 0.7 % (ref 0.0–1.5)
O2 Saturation: 97.9 %
Total hemoglobin: 9.8 g/dL — ABNORMAL LOW (ref 12.0–16.0)
Total oxygen content: 13.4 mL/dL — ABNORMAL LOW (ref 15.0–23.0)

## 2017-11-20 LAB — PROTIME-INR
INR: >4.01
PROTHROMBIN TIME: 55.6 s — AB (ref 11.4–15.2)

## 2017-11-20 LAB — CBC
HCT: 29.6 % — ABNORMAL LOW (ref 39.0–52.0)
HEMOGLOBIN: 9.2 g/dL — AB (ref 13.0–17.0)
MCH: 29.8 pg (ref 26.0–34.0)
MCHC: 31.1 g/dL (ref 30.0–36.0)
MCV: 95.8 fL (ref 78.0–100.0)
PLATELETS: 325 10*3/uL (ref 150–400)
RBC: 3.09 MIL/uL — AB (ref 4.22–5.81)
RDW: 16.9 % — ABNORMAL HIGH (ref 11.5–15.5)
WBC: 10.7 10*3/uL — ABNORMAL HIGH (ref 4.0–10.5)

## 2017-11-20 LAB — COMPREHENSIVE METABOLIC PANEL
ALBUMIN: 2.8 g/dL — AB (ref 3.5–5.0)
ALK PHOS: 112 U/L (ref 38–126)
ALT: 211 U/L — ABNORMAL HIGH (ref 17–63)
AST: 306 U/L — AB (ref 15–41)
Anion gap: 18 — ABNORMAL HIGH (ref 5–15)
BILIRUBIN TOTAL: 0.7 mg/dL (ref 0.3–1.2)
BUN: 131 mg/dL — AB (ref 6–20)
CALCIUM: 8.5 mg/dL — AB (ref 8.9–10.3)
CO2: 18 mmol/L — ABNORMAL LOW (ref 22–32)
CREATININE: 4.2 mg/dL — AB (ref 0.61–1.24)
Chloride: 93 mmol/L — ABNORMAL LOW (ref 101–111)
GFR calc Af Amer: 14 mL/min — ABNORMAL LOW (ref >60)
GFR calc non Af Amer: 12 mL/min — ABNORMAL LOW (ref >60)
GLUCOSE: 238 mg/dL — AB (ref 65–99)
Potassium: 4.4 mmol/L (ref 3.5–5.1)
Sodium: 129 mmol/L — ABNORMAL LOW (ref 135–145)
TOTAL PROTEIN: 6.5 g/dL (ref 6.5–8.1)

## 2017-11-20 LAB — PREPARE FRESH FROZEN PLASMA: Unit division: 0

## 2017-11-20 LAB — TYPE AND SCREEN
ABO/RH(D): A POS
ANTIBODY SCREEN: NEGATIVE

## 2017-11-20 LAB — BPAM FFP
BLOOD PRODUCT EXPIRATION DATE: 201901102359
UNIT TYPE AND RH: 6200

## 2017-11-20 LAB — GLUCOSE, CAPILLARY
Glucose-Capillary: 205 mg/dL — ABNORMAL HIGH (ref 65–99)
Glucose-Capillary: 208 mg/dL — ABNORMAL HIGH (ref 65–99)

## 2017-11-20 LAB — SODIUM, URINE, RANDOM: Sodium, Ur: 10 mmol/L

## 2017-11-20 LAB — HEMOGLOBIN AND HEMATOCRIT, BLOOD
HEMATOCRIT: 30.8 % — AB (ref 39.0–52.0)
HEMOGLOBIN: 9.5 g/dL — AB (ref 13.0–17.0)

## 2017-11-20 LAB — CREATININE, URINE, RANDOM: Creatinine, Urine: 89.58 mg/dL

## 2017-11-20 LAB — OCCULT BLOOD X 1 CARD TO LAB, STOOL: Fecal Occult Bld: POSITIVE — AB

## 2017-11-20 LAB — TROPONIN I: Troponin I: 0.09 ng/mL (ref <0.03)

## 2017-11-20 MED ORDER — LORAZEPAM 2 MG/ML IJ SOLN
1.0000 mg | INTRAMUSCULAR | Status: DC | PRN
  Administered 2017-11-20: 1 mg via INTRAVENOUS
  Filled 2017-11-20: qty 1

## 2017-11-20 MED ORDER — MORPHINE SULFATE (PF) 2 MG/ML IV SOLN
2.0000 mg | INTRAVENOUS | Status: DC | PRN
  Administered 2017-11-20 – 2017-11-21 (×7): 2 mg via INTRAVENOUS
  Filled 2017-11-20 (×7): qty 1

## 2017-11-20 MED ORDER — SODIUM CHLORIDE 0.9 % IV SOLN: Freq: Once | INTRAVENOUS | Status: DC

## 2017-11-20 MED ORDER — HYDROCORTISONE ACETATE 25 MG RE SUPP
25.0000 mg | Freq: Two times a day (BID) | RECTAL | Status: DC
  Administered 2017-11-20: 25 mg via RECTAL
  Filled 2017-11-20: qty 1

## 2017-11-20 MED ORDER — PANTOPRAZOLE SODIUM 40 MG PO TBEC: 40.0000 mg | DELAYED_RELEASE_TABLET | Freq: Two times a day (BID) | ORAL | Status: DC

## 2017-11-20 MED ORDER — INSULIN ASPART PROT & ASPART (70-30 MIX) 100 UNIT/ML ~~LOC~~ SUSP
8.0000 [IU] | Freq: Two times a day (BID) | SUBCUTANEOUS | Status: DC
  Filled 2017-11-20: qty 10

## 2017-11-20 MED ORDER — BOOST / RESOURCE BREEZE PO LIQD CUSTOM
1.0000 | Freq: Three times a day (TID) | ORAL | Status: DC
  Administered 2017-11-20: 1 via ORAL

## 2017-11-20 MED ORDER — PANTOPRAZOLE SODIUM 40 MG IV SOLR: 40.0000 mg | Freq: Two times a day (BID) | INTRAVENOUS | Status: DC

## 2017-11-20 MED ORDER — PHYTONADIONE 5 MG PO TABS
10.0000 mg | ORAL_TABLET | Freq: Once | ORAL | Status: AC
  Administered 2017-11-20: 10 mg via ORAL
  Filled 2017-11-20: qty 2

## 2017-11-20 NOTE — Consult Note (Addendum)
Referring Provider: No ref. provider found Primary Care Physician:  [name], MD Primary Gastroenterologist:  DR. Ewing Schlein???  Reason for Consultation: RECTAL BLEEDING W/ INR > 4   Impression: ADMITTED WITH HEART FAILURE AND INR > 4. STARTED HAVING BRBPR JAN 5. HAS HAD BRBPR IN THE PAST BUT ASSOCIATED WITH HYPOTN AND DUE TO UGIB. CURRENTLY NOT A CANDIDATE FOR ENDOSCOPY/CONSCIOUS SEDATION DUE TO CURRENT HEART FAILURE AND RISK OF COMPLICATION FROM ANESTHESIA.  UNLESS HE DEVELOPS A TRANSFUSION DEPENDENT ANEMIA OR HYPOTN. PT WOULD NEED AFLEX SIG OR TCS TO EVALUATE BRBPR. DIFFERENTIAL DIAGNOSIS INCLUDES HEMORRHOIDS, AVMs, & LESS LIKELY COLON CA OR COLON POLYPS.  Plan: 1. SUPPORTIVE CARE 2. TRANSFUSE AS NEEDED 3. PROTONIX BID 4. CONSIDER BENEFITS V. RISKS OF TCS PRIOR TO DISCHARGE. ALTHOUGH HE IS CONSIDERING WITHDRAWING AGGRESSIVE CARE. 5. ANUSOL CREAM/SUPP BID FOR 10 DAYS 6. HOLD COUMADIN. MAY CONTINUE PLAVIX.    HPI:  Came to ed due difficuty urinating and they found kidneys function down and heart not pumping right. LAST TCS 6 YRS AGO. SEEN AND EVALUATED FOR  TCS 1 YR AGO IT WAS FELT TO BE TOO RISKY.  SINCE ADMISSION INR > 4 AND HAD BRBPR TODAY. BEEN SICK FOR PAST 2-3 WEKKS SINCE STEPPING IN TACK AND NOT GOING TO THE DOCTOR. ADMITTED FOR CHF/ARF AND PLACED ON DOBUTAMINEHAD CPR NIGHT BEFORE LAST. FEELS WORSE SINCE COMING IN. BMs: NONE SINCE ADMISSION. NOT EATING & NOT HUNGRY. LAST NIGHT TERRIBLE GAS IN LOWER STOMACH ASSOCIATED WITH  PRESSURE IN LOWER STOMACH TO UPPER STOMACH/RIBS. PASSED SOME GAS FELT BETTER. A LITTLE PAIN SINCE CPR. BREATHING PRETTY FAIR. GETS WINDED IF TALKS FOR A LONG PERIOD OF TIME.  PT DENIES FEVER, CHILLS, HEMATEMESIS, nausea, vomiting, melena, diarrhea, CHEST PAIN, SHORTNESS OF BREATH, CHANGE IN BOWEL IN HABITS, constipation, abdominal pain, problems swallowing, OR heartburn or indigestion.  Past Medical History:  Diagnosis Date  . AICD (automatic  cardioverter/defibrillator) present   . Anemia   . Anemia-chronic    a. Pt reports h/o anemia - was offered IV iron in past by nephrologist but declined and has taken PO instead.  . Aneurysm, cerebral   . Arthritis    bilateral wrist and fingers  . BPH (benign prostatic hypertrophy)   . Cardiomyopathy, ischemic    LVEF 25-35%.  . Chronic kidney disease, stage III (moderate) (HCC)    Followed by Dr. Fausto Skillern.  . Coronary atherosclerosis of native coronary artery    a. DES to RCA 03/2009. b. s/p PTCA to RCA for ISR, 05/2010. c. 03/2013 NSTEMI 2/2 severe prox RCA s/p PTCA/DES, med rx for residual dz.  . Cystitis 10/2017  . Essential hypertension, benign   . Hyperlipidemia   . Hypothyroidism   . LBBB (left bundle branch block)    s/p BiV ICD Implanted by Dr Graciela Husbands  . Morbid obesity (HCC)   . MRSA (methicillin resistant Staphylococcus aureus)   . Myocardial infarction (HCC)   . Paroxysmal atrial fibrillation (HCC)    a. Coumadin discontinue in 2010 when the patient required ASA/Plavix for stenting. b. Coumadin restarted 05/2013, Plavix continued, ASA stopped.   . Pneumonia    years ago 77  . Pulmonary nodule    CXR, 03/2013, [facility] - not described on any subsequent chest x-rays however, could be a vascular structure  . TIA (transient ischemic attack)    Multiple TIAs  . Type 2 diabetes mellitus (HCC)     Past Surgical History:  Procedure Laterality Date  . BIV ICD GENERATOR CHANGEOUT N/A 03/18/2017  Medtronic Cl[place] MRI conditional CRT D device implated by Dr Johney Frame  . CHOLECYSTECTOMY    . ESOPHAGOGASTRODUODENOSCOPY N/A 10/06/2017   Procedure: ESOPHAGOGASTRODUODENOSCOPY (EGD);  Surgeon: Kerin Salen, MD;  Location: [facility];  Service: Gastroenterology;  Laterality: N/A;  . ESOPHAGOGASTRODUODENOSCOPY N/A 10/07/2017   Procedure: ESOPHAGOGASTRODUODENOSCOPY (EGD);  Surgeon: Kathi Der, MD;  Location: [facility];  Service: Gastroenterology;  Laterality: N/A;  .  ESOPHAGOGASTRODUODENOSCOPY (EGD) WITH PROPOFOL N/A 10/05/2017   Procedure: ESOPHAGOGASTRODUODENOSCOPY (EGD) WITH PROPOFOL;  Surgeon: West Bali, MD;  Location: [facility];  Service: Endoscopy;  Laterality: N/A;  has ICD, just FYI  . ICD placement  05/06/2011   Medtronic CRT-D  . LEFT HEART CATHETERIZATION WITH CORONARY ANGIOGRAM N/A 04/17/2013   Procedure: LEFT HEART CATHETERIZATION WITH CORONARY ANGIOGRAM;  Surgeon: Vesta Mixer, MD;  Location: [facility] CATH LAB;  Service: Cardiovascular;  Laterality: N/A;  . LOWER EXTREMITY ANGIOGRAPHY Left 10/12/2017   Procedure: Lower Extremity Angiography;  Surgeon: Nada Libman, MD;  Location: [facility] INVASIVE CV LAB;  Service: Cardiovascular;  Laterality: Left;  . LUNG SURGERY     24 - 25 yrs. old  . PERIPHERAL VASCULAR ATHERECTOMY Left 10/12/2017   Procedure: PERIPHERAL VASCULAR ATHERECTOMY;  Surgeon: Nada Libman, MD;  Location: [facility] INVASIVE CV LAB;  Service: Cardiovascular;  Laterality: Left;  SFA/POPLITEAL  . TOTAL KNEE ARTHROPLASTY    . VASECTOMY      Prior to Admission medications   Medication Sig Start Date End Date Taking? Authorizing Provider  carvedilol (COREG) 6.25 MG tablet Take 3 tablets (18.75 mg total) by mouth 2 (two) times daily with a meal. 11/06/17  Yes Danford, Earl Lites, MD  Cholecalciferol (VITAMIN D3) 10000 units TABS Take 1,000 Units by mouth daily.   Yes [provider]  clopidogrel (PLAVIX) 75 MG tablet Take 1 tablet (75 mg total) by mouth daily. 03/25/17  Yes Allred, Fayrene Fearing, MD  collagenase (SANTYL) ointment Apply 1 application topically daily. Apply to the affected area daily plus dry dressing 11/01/17  Yes Nadara Mustard, MD  hydrALAZINE (APRESOLINE) 10 MG tablet Take 1 tablet (10 mg total) by mouth every 8 (eight) hours. 11/06/17  Yes Danford, Earl Lites, MD  HYDROcodone-acetaminophen (NORCO) 7.5-325 MG tablet Take 1 tablet by mouth every 6 (six) hours as needed for moderate pain. Patient taking  differently: Take 1 tablet by mouth daily as needed for moderate pain.  10/15/17  Yes Zannie Cove, MD  insulin lispro protamine-lispro (HUMALOG 75/25 MIX) (75-25) 100 UNIT/ML SUSP injection Inject 10 Units into the skin 2 (two) times daily with a meal. Take 10units BID with meals Patient taking differently: Inject 10 Units into the skin 2 (two) times daily with a meal.  10/15/17  Yes Zannie Cove, MD  isosorbide dinitrate (ISORDIL) 10 MG tablet Take 1 tablet (10 mg total) by mouth 3 (three) times daily. 11/06/17  Yes Danford, Earl Lites, MD  l-methylfolate-B6-B12 (METANX) 3-35-2 MG TABS tablet Take 1 tablet by mouth daily.   Yes [provider]  levothyroxine (SYNTHROID, LEVOTHROID) 50 MCG tablet Take 50 mcg by mouth daily. 06/29/17  Yes [provider]  nitroGLYCERIN (NITROSTAT) 0.4 MG SL tablet Place 0.4 mg under the tongue every 5 (five) minutes as needed for chest pain. For chest pain 04/26/13  Yes Serpe, Clide Deutscher, PA-C  pantoprazole (PROTONIX) 40 MG tablet Take 1 tablet (40 mg total) by mouth 2 (two) times daily before a meal. 11/06/17  Yes Danford, Earl Lites, MD  potassium chloride SA (K-DUR,KLOR-CON) 20  MEQ tablet Take 1 tablet (20 mEq total) by mouth daily. 11/07/17  Yes Danford, Earl Lites, MD  tamsulosin (FLOMAX) 0.4 MG CAPS capsule Take 0.4 mg by mouth daily as needed (bladder).    Yes [provider]  torsemide (DEMADEX) 10 MG tablet Take 1 tablet (10 mg total) by mouth 2 (two) times daily. Patient taking differently: Take 10 mg by mouth 3 (three) times daily.  12/30/15  Yes Jonelle Sidle, MD  warfarin (COUMADIN) 2.5 MG tablet Take 2.5-5 mg by mouth See admin instructions. Pt takes 5mg  by mouth once daily on Mon, Wed, Fri - takes 2.5mg  once daily Tues, Thurs, Sat, Sun   Yes [provider]  protein supplement shake (PREMIER PROTEIN) LIQD Take 325 mLs (11 oz total) by mouth 2 (two) times daily between meals. 11/06/17   Danford,  11/08/17, MD    Current Facility-Administered Medications  Medication Dose Route Frequency Provider Last Rate Last Dose  . 0.9 %  sodium chloride infusion  250 mL Intravenous PRN Amin, Ankit Chirag, MD      . 0.9 %  sodium chloride infusion   Intravenous Once Tat, David, MD      . acetaminophen (TYLENOL) tablet 650 mg  650 mg Oral Q4H PRN Amin, Ankit Chirag, MD      . amiodarone (NEXTERONE PREMIX) 360-4.14 MG/200ML-% (1.8 mg/mL) IV infusion  30 mg/hr Intravenous Continuous 03-10[birth year]1980, MD 16.7 mL/hr at 11/20/17 0901 30 mg/hr at 11/20/17 0901  . cholecalciferol (VITAMIN D) tablet 1,000 Units  1,000 Units Oral Daily 01/18/18, MD   1,000 Units at 11/20/17 0903  . DOBUTamine (DOBUTREX) infusion 4000 mcg/mL  2 mcg/kg/min Intravenous Titrated 01/18/18, MD 3.5 mL/hr at 11/19/17 1622 2 mcg/kg/min at 11/19/17 1622  . furosemide (LASIX) injection 80 mg  80 mg Intravenous TID 01/17/18, MD   80 mg at 11/20/17 0905  . HYDROcodone-acetaminophen (NORCO) 7.5-325 MG per tablet 1 tablet  1 tablet Oral Q6H PRN [birth year]-16-1977, MD   1 tablet at 11/20/17 0131  . insulin aspart (novoLOG) injection 0-5 Units  0-5 Units Subcutaneous 01/18/18, MD   2 Units at 11/19/17 2253  . insulin aspart (novoLOG) injection 0-9 Units  0-9 Units Subcutaneous TID WC 2254, MD   3 Units at 11/20/17 0910  . insulin aspart protamine- aspart (NOVOLOG MIX 70/30) injection 8 Units  8 Units Subcutaneous BID WC Tat, David, MD      . l-methylfolate-B6-B12 (METANX) 3-35-2 MG per tablet 1 tablet  1 tablet Oral Daily Amin, 01/18/18, MD   1 tablet at 11/20/17 1024  . levothyroxine (SYNTHROID, LEVOTHROID) tablet 50 mcg  50 mcg Oral QAC breakfast Amin, Ankit Chirag, MD   50 mcg at 11/20/17 0902  . nitroGLYCERIN (NITROSTAT) SL tablet 0.4 mg  0.4 mg Sublingual Q5 min PRN Amin, Ankit Chirag, MD   0.4 mg at 11/19/17 0235  . ondansetron (ZOFRAN) injection 4 mg  4 mg Intravenous Q6H PRN 01/17/18, MD   4 mg at 11/19/17 2109  . pantoprazole (PROTONIX) injection 40 mg  40 mg Intravenous Q12H Tat, David, MD      . sodium chloride flush (NS) 0.9 % injection 3 mL  3 mL Intravenous Q12H Amin, Ankit Chirag, MD   3 mL at 11/20/17 0911  . sodium chloride flush (NS) 0.9 % injection 3 mL  3 mL Intravenous PRN Amin, 01/18/18, MD      .  tamsulosin (FLOMAX) capsule 0.4 mg  0.4 mg Oral Daily PRN Dimple Nanas, MD      . Warfarin - Pharmacist Dosing Inpatient   Does not apply Q24H Dimple Nanas, MD   Stopped at 11/20/17 1600  . zinc sulfate capsule 220 mg  220 mg Oral QHS Amin, Loura Halt, MD        Allergies as of December 08, 2017 - Review Complete Dec 08, 2017  Allergen Reaction Noted  . Allegra [fexofenadine] Itching 08/03/2017  . Crestor [rosuvastatin calcium] Other (See Comments) 08/03/2017  . Codeine Other (See Comments)     Family History  Problem Relation Age of Onset  . Diabetes Mother        Bilateral leg amputation  . Heart disease Mother        Before age 55  . Hypertension Mother   . Liver disease Mother        fatty liver   . Cancer Father        Prostate and Bone  . Diabetes Father   . Cancer Brother        Prostate  . Heart disease Other        family h/o premature cardiovascular disease  . Cancer Sister        Colon   . Hypertension Sister      Social History   Socioeconomic History  . Marital status: Married    Spouse name: Not on file  . Number of children: Not on file  . Years of education: Not on file  . Highest education level: Not on file  Social Needs  . Financial resource strain: Not on file  . Food insecurity - worry: Not on file  . Food insecurity - inability: Not on file  . Transportation needs - medical: Not on file  . Transportation needs - non-medical: Not on file  Occupational History  . Not on file  Tobacco Use  . Smoking status: Former Smoker    Packs/day: 1.00    Years: 6.00    Pack years: 6.00    Types: Cigarettes     Start date: 12/08/1949    Last attempt to quit: 11/16/1964    Years since quitting: 53.0  . Smokeless tobacco: Former Neurosurgeon    Quit date: 04/15/1965  Substance and Sexual Activity  . Alcohol use: Yes    Alcohol/week: 0.0 oz    Comment: once/month  . Drug use: No  . Sexual activity: Not on file  Other Topics Concern  . Not on file  Social History Narrative  . Not on file    Review of Systems: PER HPI OTHERWISE ALL SYSTEMS ARE NEGATIVE.   Vitals: Blood pressure 92/63, pulse 62, temperature (!) 97.2 F (36.2 C), temperature source Axillary, resp. rate 16, height 5\' 8"  (1.727 m), weight 255 lb 4.7 oz (115.8 kg), SpO2 99 %.  Physical Exam: General:   Alert,  Well-developed, well-nourished, pleasant and cooperative in NAD, BUT GETS WINDED IN MIDDLE OF CONVERSATION Head:  Normocephalic and atraumatic. Eyes:  Sclera clear, no icterus.   Conjunctiva pink. Mouth:  No lesions, dentition normal. Neck:  Supple; no masses. Lungs:  Clear throughout to auscultation.   No wheezes. No acute distress. Heart:  Regular rate and rhythm; SYSTOLIC murmurs. Abdomen:  Soft, nontender and nondistended. No masses noted. Normal bowel sounds, without guarding, and without rebound.   Msk:  Symmetrical without gross deformities. Normal posture. Extremities:  With edema. Neurologic:  Alert and  oriented x4;  NO  NEW FOCAL DEFICITS Cervical Nodes:  No significant cervical adenopathy. Psych:  Alert and cooperative. Normal mood and affect.   Lab Results: Recent Labs    12/14/2017 1606 11/20/17 0109  WBC 11.2* 10.7*  HGB 9.8* 9.2*  HCT 31.5* 29.6*  PLT 332 325   BMET Recent Labs    11/19/17 1721 11/20/17 0110  NA 129* 129*  K 5.0 4.4  CL 94* 93*  CO2 19* 18*  GLUCOSE 234* 238*  BUN 128* 131*  CREATININE 4.17* 4.20*  CALCIUM 8.7* 8.5*   LFT Recent Labs    11/20/17 0110  PROT 6.5  ALBUMIN 2.8*  AST 306*  ALT 211*  ALKPHOS 112  BILITOT 0.7     Studies/Results: CXR Jan 3: Pulmonary  Edema   LOS: 2 days   Alexiz Cothran  11/20/2017, 10:49 AM

## 2017-11-20 NOTE — Progress Notes (Addendum)
Notified of hematochezia by RN -INR remained supratherapeutic -vitamin K 10 mg already ordered -give one unit FFP -consult GI -trend H/H q 8 hours -d/c coumadin -d/c plavix -change protonix to IV bid -spoke with renal, Dr. Ailene Ravel need CRRT -additional 15 min critical care time  DTat

## 2017-11-20 NOTE — Progress Notes (Signed)
RN in to see pt and family. Pt discussed being tired and needing to "tie up lose ends." MD ahd discussed the need for dialysis earlier with the pt and wife. Pt stated to RN that he did not wish to proceed with dialysis. He stated that he is "tired of being in the hospital." wife at beside and supportive of decision. Dr. Wolfgang Phoenix made aware. Attending made aware.

## 2017-11-20 NOTE — Progress Notes (Signed)
ANTICOAGULATION CONSULT NOTE   Pharmacy Consult for Warfarin (home med) Indication: atrial fibrillation  Allergies  Allergen Reactions  . Allegra [Fexofenadine] Itching  . Crestor [Rosuvastatin Calcium] Other (See Comments)    Body aches  . Codeine Other (See Comments)    blisters, but able to take hydrocodone    Patient Measurements: Height: 5\' 8"  (172.7 cm) Weight: 255 lb 4.7 oz (115.8 kg) IBW/kg (Calculated) : 68.4 Heparin Dosing Weight:   Vital Signs: Temp: 97.2 F (36.2 C) (01/05 0829) Temp Source: Axillary (01/05 0829) BP: 94/59 (01/05 0300) Pulse Rate: 96 (01/05 0829)  Labs: Recent Labs    12/10/2017 1606 11/20/2017 1900 11/19/17 0152  11/19/17 1344 11/19/17 1721 11/20/17 0109 11/20/17 0110  HGB 9.8*  --   --   --   --   --  9.2*  --   HCT 31.5*  --   --   --   --   --  29.6*  --   PLT 332  --   --   --   --   --  325  --   LABPROT  --  40.0* 47.0*  --   --   --  55.6*  --   INR  --  4.17* 5.13*  --   --   --  >4.01*  --   CREATININE 3.73*  --  4.09*   < > 4.02* 4.17*  --  4.20*  TROPONINI 0.03*  --  0.03*   < > 0.07* 0.08* 0.09*  --    < > = values in this interval not displayed.    Estimated Creatinine Clearance: 18.2 mL/min (A) (by C-G formula based on SCr of 4.2 mg/dL (H)).   Medical History: Past Medical History:  Diagnosis Date  . AICD (automatic cardioverter/defibrillator) present   . Anemia   . Anemia-chronic    a. Pt reports h/o anemia - was offered IV iron in past by nephrologist but declined and has taken PO instead.  . Aneurysm, cerebral   . Arthritis    bilateral wrist and fingers  . BPH (benign prostatic hypertrophy)   . Cardiomyopathy, ischemic    LVEF 25-35%.  . Chronic kidney disease, stage III (moderate) (HCC)    Followed by Dr. 01/18/18.  . Coronary atherosclerosis of native coronary artery    a. DES to RCA 03/2009. b. s/p PTCA to RCA for ISR, 05/2010. c. 03/2013 NSTEMI 2/2 severe prox RCA s/p PTCA/DES, med rx for residual dz.  .  Cystitis 10/2017  . Essential hypertension, benign   . Hyperlipidemia   . Hypothyroidism   . LBBB (left bundle branch block)    s/p BiV ICD Implanted by Dr 11/2017  . Morbid obesity (HCC)   . MRSA (methicillin resistant Staphylococcus aureus)   . Myocardial infarction (HCC)   . Paroxysmal atrial fibrillation (HCC)    a. Coumadin discontinue in 2010 when the patient required ASA/Plavix for stenting. b. Coumadin restarted 05/2013, Plavix continued, ASA stopped.   . Pneumonia    years ago 25  . Pulmonary nodule    CXR, 03/2013, [facility] - not described on any subsequent chest x-rays however, could be a vascular structure  . TIA (transient ischemic attack)    Multiple TIAs  . Type 2 diabetes mellitus (HCC)     Medications:  Medications Prior to Admission  Medication Sig Dispense Refill Last Dose  . carvedilol (COREG) 6.25 MG tablet Take 3 tablets (18.75 mg total) by mouth 2 (two) times daily with  a meal. 180 tablet 0 11/20/2017 at 630a  . Cholecalciferol (VITAMIN D3) 10000 units TABS Take 1,000 Units by mouth daily.   11/17/2017 at Unknown time  . clopidogrel (PLAVIX) 75 MG tablet Take 1 tablet (75 mg total) by mouth daily. 30 tablet 6 12/01/2017 at Unknown time  . collagenase (SANTYL) ointment Apply 1 application topically daily. Apply to the affected area daily plus dry dressing 90 g 3 11/19/2017 at Unknown time  . hydrALAZINE (APRESOLINE) 10 MG tablet Take 1 tablet (10 mg total) by mouth every 8 (eight) hours. 90 tablet 0 11/30/2017 at 630a  . HYDROcodone-acetaminophen (NORCO) 7.5-325 MG tablet Take 1 tablet by mouth every 6 (six) hours as needed for moderate pain. (Patient taking differently: Take 1 tablet by mouth daily as needed for moderate pain. ) 10 tablet 0 unknown  . insulin lispro protamine-lispro (HUMALOG 75/25 MIX) (75-25) 100 UNIT/ML SUSP injection Inject 10 Units into the skin 2 (two) times daily with a meal. Take 10units BID with meals (Patient taking differently: Inject 10 Units into the  skin 2 (two) times daily with a meal. )   11/17/2017 at Unknown time  . isosorbide dinitrate (ISORDIL) 10 MG tablet Take 1 tablet (10 mg total) by mouth 3 (three) times daily. 180 tablet 0 12/10/2017 at Unknown time  . l-methylfolate-B6-B12 (METANX) 3-35-2 MG TABS tablet Take 1 tablet by mouth daily.   12/15/2017 at Unknown time  . levothyroxine (SYNTHROID, LEVOTHROID) 50 MCG tablet Take 50 mcg by mouth daily.   11/17/2017 at Unknown time  . nitroGLYCERIN (NITROSTAT) 0.4 MG SL tablet Place 0.4 mg under the tongue every 5 (five) minutes as needed for chest pain. For chest pain   unknown  . pantoprazole (PROTONIX) 40 MG tablet Take 1 tablet (40 mg total) by mouth 2 (two) times daily before a meal. 60 tablet 0 11/29/2017 at Unknown time  . potassium chloride SA (K-DUR,KLOR-CON) 20 MEQ tablet Take 1 tablet (20 mEq total) by mouth daily. 30 tablet 0 11/25/2017 at Unknown time  . tamsulosin (FLOMAX) 0.4 MG CAPS capsule Take 0.4 mg by mouth daily as needed (bladder).    12/08/2017 at morning  . torsemide (DEMADEX) 10 MG tablet Take 1 tablet (10 mg total) by mouth 2 (two) times daily. (Patient taking differently: Take 10 mg by mouth 3 (three) times daily. ) 60 tablet 3 12/07/2017 at Unknown time  . warfarin (COUMADIN) 2.5 MG tablet Take 2.5-5 mg by mouth See admin instructions. Pt takes 5mg  by mouth once daily on Mon, Wed, Fri - takes 2.5mg  once daily Tues, Thurs, Sat, Sun   11/17/2017 at 16-1700  . protein supplement shake (PREMIER PROTEIN) LIQD Take 325 mLs (11 oz total) by mouth 2 (two) times daily between meals.  0 Taking    Assessment: 77 yo man on coumadin for afib.  INR remains elevated today.  MD has ordered Vit K 10 mg po today/  Goal of Therapy:  INR 2-3   Plan:  No Warfarin tonight Daily PT/INR Monitor for signs and symptoms of bleeding.   [name] 11/20/2017,9:21 AM

## 2017-11-20 NOTE — Progress Notes (Signed)
[employer] has been notified of patient's comfort measures only status and on call representative has been paged to come out and turn off pt's AICD.

## 2017-11-20 NOTE — Progress Notes (Signed)
Co-ox sat yesterday actually pretty good at 70%. Based on his worsened cardiac function by echo, failure to diurese with worsening renal function and continued volume overload I did start dobutamine low dose. Heart rates are tolerating and we will continue today. Still without much urine output, I would consider increasing his diuretic dosing if ok with renal, would not titrate inotrope at this time given his high co-ox this morning    Dina Rich MD

## 2017-11-20 NOTE — Progress Notes (Addendum)
Had another meeting with patient and spouse. Also discussed case with renal, Dr. Wolfgang Phoenix and GI, Dr. Darrick Penna Dr. Darrick Penna recommends symptomatic treatment for now. Patient has opted against starting HD after discussion with Dr. Wolfgang Phoenix.  I discussed his overall poor prognosis with patient and wife in the setting of his current multisystem organ failure including renal, respiratory, hematologic, and hepatic failure. Patient then states, "I'm tired.  I don't want to be in pain and sufffer any more."  I discussed options with patient.  He has decided against any further aggressive measure and does not want to be transferred to [facility].  A mutual decision was made to transition the patient's focus of care to FULL COMFORT.  Will continue current drips at current settings until family has had chance to visit patient.  D/C any further labs or diagnostic studies.  Total time 30 additional minutes  DTat

## 2017-11-20 NOTE — Progress Notes (Addendum)
RALPHEAL ZAPPONE  MRN: [id number]  DOB/AGE: 1941/07/25 77 y.o.  Primary Care Physician:Shah, Beatrix Fetters, MD  Admit date: 12/02/2017  Chief Complaint:  Chief Complaint  Patient presents with  . Shortness of Breath    S-Pt presented on  12/16/2017 with  Chief Complaint  Patient presents with  . Shortness of Breath  .    Pt offers no new complaints.   meds  . cholecalciferol  1,000 Units Oral Daily  . clopidogrel  75 mg Oral Daily  . furosemide  80 mg Intravenous TID  . insulin aspart  0-5 Units Subcutaneous QHS  . insulin aspart  0-9 Units Subcutaneous TID WC  . insulin aspart protamine- aspart  5 Units Subcutaneous BID WC  . l-methylfolate-B6-B12  1 tablet Oral Daily  . levothyroxine  50 mcg Oral QAC breakfast  . pantoprazole  40 mg Oral BID AC  . sodium chloride flush  3 mL Intravenous Q12H  . Warfarin - Pharmacist Dosing Inpatient   Does not apply Q24H  . zinc sulfate  220 mg Oral QHS      Physical Exam: Vital signs in last 24 hours: Temp:  [97 F (36.1 C)-97.6 F (36.4 C)] 97 F (36.1 C) (01/05 0400) Pulse Rate:  [55-125] 107 (01/05 0300) Resp:  [13-31] 15 (01/05 0300) BP: (80-130)/(33-113) 94/59 (01/05 0300) SpO2:  [92 %-100 %] 92 % (01/05 0300) Weight:  [255 lb 4.7 oz (115.8 kg)] 255 lb 4.7 oz (115.8 kg) (01/05 0400) Weight change: -11.3 oz (-0.321 kg) Last BM Date: 11/19/17  Intake/Output from previous day: 01/04 0701 - 01/05 0700 In: 10 [I.V.:10] Out: 150 [Urine:150] No intake/output data recorded.   Physical Exam: General- pt is awake,alert, oriented to time place and person Resp- No acute REsp distress,  Rhonchi+ CVS- S1S2 regular in rate and rhythm GIT- BS+, soft, NT, ND EXT- 2+ LE Edema, Cyanosis   Lab Results: CBC Recent Labs    12/03/2017 1606 11/20/17 0109  WBC 11.2* 10.7*  HGB 9.8* 9.2*  HCT 31.5* 29.6*  PLT 332 325    BMET Recent Labs    11/19/17 1721 11/20/17 0110  NA 129* 129*  K 5.0 4.4  CL 94* 93*  CO2 19* 18*  GLUCOSE  234* 238*  BUN 128* 131*  CREATININE 4.17* 4.20*  CALCIUM 8.7* 8.5*    Creat 2019 3.7=> 4.0=>4.2 2018 1.6--2.5 2.0--2.5( December admission) 1.6--2.4( November admission) 2015  1.8 2014  1.5--2.3 ( AKI)       MICRO No results found for this or any previous visit (from the past 240 hour(s)).    Lab Results  Component Value Date   CALCIUM 8.5 (L) 11/20/2017   CAION 1.22 04/14/2013   PHOS 3.3 10/27/2017    CXR Increased vascular congestion and pulmonary edema. Similar cardiomegaly. Probable small pleural effusions.  Impression: 1)Renal  AKI secondary to ATN/Cardiorenal               AKI sec to Cardiorenal/Code blue               AKI on CKD               CKD stage 4.               CKD since 2014               CKD secondary to Cardiorenal/DM/HTN                 Progression of CKD marked with  multiple AKI                Hematuria none.                Nephrolithiasis Hx Absent                 AKI                Creat at plateau                NON  Oliguria ATN=>. Now oliguric                BUN high sec to GI bleed+ AKI + CKD                NON responsive to IV lasix -80mg  iv q8hrs                               2)HTN  Medication- On Diuretics On Beta blockers    3)Anemia HGb low Now with GI bleed Primary team and GI following   4)CKD Mineral-Bone Disorder PTH not avail . Secondary Hyperparathyroidism w/u pending . Phosphorus at goal.   5)CVS- admitted with CHF exacerbation.       S/P Code sec to PEA        Afib          On IV Amiodarone+ IV Dobutamine        Cardiology and  Primary MD following          Now awaiting transfer to [place].  6)Electrolytes Hyperkalemic    Sec to AKI+ CKD    Now better  Hyponatremic   Sec to Hypervolemia + CKD+ AKI  7)Acid base Co2 not  at goal 14=>18    Plan:  Pt not responding to IV diuresis Pt CXR and physical examination shows worsening  Edema.  I had extensive  discussion with pt and family about worsening of renal function. I discussed with pt about need /benefit/risks .of renal replacement therapy. Pt asked relevant questions about dialysis.  Pt will benefit  From CRRT as pt is hypotensive, on pressors but pt has been awaiting transfer to [place]. In the interim will attempt to start renal replacement therapy. Pt after the discussion asked me " I have to think about it, give me some time"    Addendum Pt extensive discussion Pt said " I do not want dialysis"   Eston Heslin S 11/20/2017, 8:13 AM

## 2017-11-20 NOTE — Progress Notes (Addendum)
PROGRESS NOTE  [name] NUU:[id number] DOB: Feb 17, 1941 DOA: 2017/11/29 PCP: [name], MD  Brief History:  77 year old male with a history of ischemic cardiomyopathy, coronary disease, hypertension, persistent atrial fibrillation, recent NSTEMI 10/2017, CKD stage IV, peripheral vascular disease presenting with 15 pound weight gain and increasing shortness of breath.  The patient also has been complaining of increasing lower extremity edema and increasing abdominal girth.  He endorses compliance with his diuretics and fluid restriction.  There is not been any fevers, chills, chest pain, nausea, vomiting, diarrhea, headache.  The patient has had 2 recent prolonged hospitalizations.  Most recently, the patient was hospitalized for 10/23/17 through 11/06/17.  During that hospitalization, the patient was treated for sepsis although the source was unclear.  He was discharged in stable condition without antibiotics.  During that hospitalization he was also intubated for respiratory failure secondary to acute on chronic systolic CHF and NSTEMI.  Prior to that hospitalization, the patient was hospitalized from 09/27/17 through 10/15/17 once again for sepsis secondary to diabetic foot infection.  The patient was also intubated for respiratory failure at that time.  His hospital stay was complicated by a massive GI bleed.  His warfarin was stopped at the time of discharge.  He underwent 2 EGDs which showed clotted blood in the gastric fundus and body.  On the morning of 11/19/2017, CODE BLUE was activated secondary to PEA.  The patient underwent CPR for approximately 3 minutes with ROSC. No meds were given during PEA.  Cardiology was consulted to assist.  Echo showed decreased EF from 10/13/17 and new severe RV.  Pt was started on amiodarone drip and dobutamine.  Cardiology requested transfer to [facility] for Advanced CHF team eval.   Assessment/Plan: Acute on chronic systolic and diastolic  CHF -11/06/2017 discharge weight 242 -10/13/2017 echo--EF 25-30, grade 1 DD, mild TR -continue lasix 80 mg IV tid -daily weights--NEG 1 lb since admission -u/o remains poor--only 150cc overnight -fluid restrict -holding hydralazine and isordil due to soft BP -holding coreg due to decompensation and soft BP -11/20/17--case discussed with cardiology, Dr. Di Kindle current amio, dobutamine, may need increase diuretics if ok with renal  Cardiac Arrest/PEA -occurred early am 11/19/17--in part due to hyperkalemia -ROSC with chest compressions only -cardiology follow up appreciated  Acute on chronic renal failure--CKD 4 -Baseline creatinine 2.0-2.4 -Presenting creatinine 3.73 -renal ultrasound--no hydronephrosis, atrophic left kidney, R-medical renal disease. -due to progression of renal disease with cardiorenal syndrome -renal following -serum creatinine appears to reached plateau  Acute respiratory failure with hypoxia -Presently stable on 3 L -Secondary to pulmonary edema -Wean oxygen for saturation greater than 92%  Transaminasemia -due to shock liver -trend  -no abdominal pain  Hyperkalemia -s/p Kayexalate x 3-->improved -Secondary to potassium supplementation which has been discontinued and renal failure -Remain on telemetry  ICM -EF 25-30% on Nov. 2018 Echo -s/p AICD  Coagulopathy/Supratherapeutic INR -Hold warfarin -in part due to shock liver -Presenting INR 4.17 -Monitor for signs of bleeding -Dose of vitamin K 1/4-->repeat vitamin K 11/20/17 -Hgb remains stable  Persistent atrial fibrillation -Patient had GI bleed in November 2018 -No signs of bleeding at this time -Holding warfarin secondary to supratherapeutic INR -continue amiodarone drip per cardiology  Diabetes Mellitus type 2 -increase 70/30 to 8 units bid -continue novolog SSI -09/28/2017 hemoglobin A1c 7.8  Hydrocele/Scrotal Edema -10/28/2017 scrotal ultrasound--right hydrocele, small  left varicocele without torsion  Peripheral vascular disease -10/12/2017 arteriogram arthrectomy and angioplasty  with left SFA and popliteal  -continue Plavix  Disposition Plan:  Transfer to [facility] Family Communication:   Spouse updated at bedside  Consultants:  Cardiology, renal  Code Status:  DNR  DVT Prophylaxis:  coumadin   Procedures: As Listed in Progress Note Above  Antibiotics: None  Total critical care time 35 min.     Subjective: The patient states that he had some abdominal cramping overnight which improved after bowel movement.  He denies any chest pain, shortness breath, nausea, vomiting, diarrhea he denies any hematochezia or melena..   Objective: Vitals:   11/20/17 0300 11/20/17 0400 11/20/17 0828 11/20/17 0829  BP: (!) 94/59     Pulse: (!) 107   96  Resp: 15     Temp:  (!) 97 F (36.1 C)  (!) 97.2 F (36.2 C)  TempSrc:  Axillary  Axillary  SpO2: 92%  93% 98%  Weight:  115.8 kg (255 lb 4.7 oz)    Height:        Intake/Output Summary (Last 24 hours) at 11/20/2017 0856 Last data filed at 11/19/2017 1622 Gross per 24 hour  Intake 9.99 ml  Output 150 ml  Net -140.01 ml   Weight change: -0.321 kg (-11.3 oz) Exam:   General:  Pt is alert, follows commands appropriately, not in acute distress  HEENT: No icterus, No thrush, No neck mass, [place]/AT  Cardiovascular: IRRR, S1/S2, no rubs, no gallops  Respiratory: Bibasilar crackles.  No wheezing.  Good air movement.  Abdomen: Soft/+BS, non tender, non distended, no guarding  Extremities: 1 +LE edema, No lymphangitis, No petechiae, No rashes, no synovitis   Data Reviewed: I have personally reviewed following labs and imaging studies Basic Metabolic Panel: Recent Labs  Lab 11/19/17 0152 11/19/17 0953 11/19/17 1344 11/19/17 1721 11/20/17 0110  NA 129* 129* 128* 129* 129*  K 6.0* 5.8* 5.2* 5.0 4.4  CL 94* 96* 95* 94* 93*  CO2 18* 14* 19* 19* 18*  GLUCOSE 183* 202* 242* 234* 238*   BUN 127* 130* 136* 128* 131*  CREATININE 4.09* 4.05* 4.02* 4.17* 4.20*  CALCIUM 9.0 9.0 8.9 8.7* 8.5*  MG 2.3  --   --   --   --    Liver Function Tests: Recent Labs  Lab 11/19/17 0152 11/20/17 0110  AST 261* 306*  ALT 129* 211*  ALKPHOS 106 112  BILITOT 1.4* 0.7  PROT 7.3 6.5  ALBUMIN 2.9* 2.8*   No results for input(s): LIPASE, AMYLASE in the last 168 hours. No results for input(s): AMMONIA in the last 168 hours. Coagulation Profile: Recent Labs  Lab 12/06/2017 1900 11/19/17 0152 11/20/17 0109  INR 4.17* 5.13* >4.01*   CBC: Recent Labs  Lab 11/16/2017 1606 11/20/17 0109  WBC 11.2* 10.7*  HGB 9.8* 9.2*  HCT 31.5* 29.6*  MCV 96.9 95.8  PLT 332 325   Cardiac Enzymes: Recent Labs  Lab 11/19/17 0152 11/19/17 0750 11/19/17 1344 11/19/17 1721 11/20/17 0109  TROPONINI 0.03* 0.04* 0.07* 0.08* 0.09*   BNP: Invalid input(s): POCBNP CBG: Recent Labs  Lab 11/19/17 0718 11/19/17 1101 11/19/17 1634 11/19/17 2209 11/20/17 0828  GLUCAP 188* 195* 220* 236* 205*   HbA1C: No results for input(s): HGBA1C in the last 72 hours. Urine analysis:    Component Value Date/Time   COLORURINE AMBER (A) 10/12/2017 0450   APPEARANCEUR CLOUDY (A) 10/12/2017 0450   LABSPEC 1.024 10/12/2017 0450   PHURINE 5.0 10/12/2017 0450   GLUCOSEU NEGATIVE 10/12/2017 0450   HGBUR LARGE (A)  10/12/2017 0450   BILIRUBINUR NEGATIVE 10/12/2017 0450   KETONESUR 5 (A) 10/12/2017 0450   PROTEINUR 100 (A) 10/12/2017 0450   UROBILINOGEN 0.2 07/05/2014 2117   NITRITE NEGATIVE 10/12/2017 0450   LEUKOCYTESUR TRACE (A) 10/12/2017 0450   Sepsis Labs: @LABRCNTIP (procalcitonin:4,lacticidven:4) )No results found for this or any previous visit (from the past 240 hour(s)).   Scheduled Meds: . cholecalciferol  1,000 Units Oral Daily  . clopidogrel  75 mg Oral Daily  . furosemide  80 mg Intravenous TID  . insulin aspart  0-5 Units Subcutaneous QHS  . insulin aspart  0-9 Units Subcutaneous TID WC  .  insulin aspart protamine- aspart  5 Units Subcutaneous BID WC  . l-methylfolate-B6-B12  1 tablet Oral Daily  . levothyroxine  50 mcg Oral QAC breakfast  . pantoprazole  40 mg Oral BID AC  . phytonadione  10 mg Oral Once  . sodium chloride flush  3 mL Intravenous Q12H  . Warfarin - Pharmacist Dosing Inpatient   Does not apply Q24H  . zinc sulfate  220 mg Oral QHS   Continuous Infusions: . sodium chloride    . amiodarone 30 mg/hr (11/19/17 2046)  . DOBUTamine 2 mcg/kg/min (11/19/17 1622)    Procedures/Studies: Dg Chest 2 View  Result Date: 11/26/2017 CLINICAL DATA:  SOB, PT STATES HE JUST GOT OUT OF THE HOSPITAL 10-12 DAYS AGO AND HIS SOB AND FLUID RETENTION HAS CONTINUED TO WORSEN EXAM: CHEST  2 VIEW COMPARISON:  None. FINDINGS: Bilateral mild interstitial thickening. Small right pleural effusion. No pneumothorax. No focal consolidation. Stable cardiomegaly. Three lead cardiac pacemaker. No acute osseous abnormality. IMPRESSION: Findings concerning for mild CHF. Electronically Signed   By: Elige Ko   On: 11/19/2017 15:28   Nm Bone Scan 3 Phase  Result Date: 10/29/2017 CLINICAL DATA:  Left foot pain and swelling after plantar puncture wound approximately 2 months ago. Evaluate for osteomyelitis. Unable to undergo MRI. EXAM: NUCLEAR MEDICINE 3-PHASE BONE SCAN TECHNIQUE: Radionuclide angiographic images, immediate static blood pool images, and 3-hour delayed static images were obtained of the feet and distal lower legs after intravenous injection of radiopharmaceutical. RADIOPHARMACEUTICALS:  21.3 mCi Tc-25m MDP COMPARISON:  Left foot radiographs 10/03/2017.  CT 09/28/2017. FINDINGS: Vascular phase: There is asymmetrically increased perfusion to the left foot relative to the right. Blood pool phase: There is prominent asymmetric blood pool activity within the left forefoot medially. Delayed phase: Delayed images demonstrate focal activity within the bases of the first and second proximal  phalanges, adjacent to the first web space. There is bilateral midfoot activity associated with the Lisfranc joints, likely arthropathic. IMPRESSION: 1. Abnormal three-phase bone scan with increased perfusion, blood pool and delayed phase activity in the left forefoot. This appears to localize to the first and second proximal phalangeal bases on the delayed images and is suspicious for osteomyelitis. Consider follow-up CT for confirmation. 2. Delayed phase activity at the Lisfranc joint bilaterally, likely arthropathic. Electronically Signed   By: Carey Bullocks M.D.   On: 10/29/2017 16:46   US Scrotum  Result Date: 10/28/2017 CLINICAL DATA:  Bilateral full swelling. EXAM: SCROTAL ULTRASOUND DOPPLER ULTRASOUND OF THE TESTICLES TECHNIQUE: Complete ultrasound examination of the testicles, epididymis, and other scrotal structures was performed. Color and spectral Doppler ultrasound were also utilized to evaluate blood flow to the testicles. COMPARISON:  CT 06/13/2013. FINDINGS: Right testicle Measurements: 4.4 x 2.3 x 2.8 cm. No mass or microlithiasis visualized. Left testicle Measurements: 3.8 x 2.2 x 3.5 cm. No mass or microlithiasis  visualized. Right epididymis:  Normal in size and appearance. Left epididymis:  Normal in size and appearance. Hydrocele:  Small right. Varicocele:  Small left. Pulsed Doppler interrogation of both testes demonstrates bilateral color Doppler flow. Diffuse scrotal wall thickening. No scrotal wall fluid collections are noted. IMPRESSION: 1. Diffuse scrotal wall thickening. 2. Small right hydrocele. Small left varicocele. No evidence of testicular mass or torsion . Electronically Signed   By: Maisie Fus  Register   On: 10/28/2017 15:42   US Renal  Result Date: 11/19/2017 CLINICAL DATA:  Acute renal failure, stage IV. EXAM: RENAL / URINARY TRACT ULTRASOUND COMPLETE COMPARISON:  Body CT 06/13/2013 FINDINGS: Right Kidney: Length: 10.3 cm. Moderate cortical thinning. No mass or  hydronephrosis visualized. Left Kidney: Length: 4.4 cm.  Atrophic appearance of the kidney. Bladder: Poorly visualized as it is decompressed. IMPRESSION: Right renal cortical thinning, consistent with medical renal disease. Atrophic left kidney. Poor visualization of the urinary bladder. Electronically Signed   By: Ted Mcalpine M.D.   On: 11/19/2017 17:10   Ct Foot Left Wo Contrast  Result Date: 10/30/2017 CLINICAL DATA:  Diffuse soft tissue swelling. Stepped on a tack, now with subsequent infection. EXAM: CT OF THE LEFT FOOT WITHOUT CONTRAST TECHNIQUE: Multidetector CT imaging of the left foot was performed according to the standard protocol. Multiplanar CT image reconstructions were also generated. COMPARISON:  Radiographs 10/03/2017 FINDINGS: Diffuse and fairly marked subcutaneous soft tissue swelling/ edema/ fluid consistent with cellulitis. No discrete fluid collection to suggest a drainable abscess. Diffuse fatty change involving the foot and ankle musculature but no definite findings for myofasciitis or pyomyositis. The On the sagittal sequence there is a small open wound noted along the plantar aspect of the forefoot at the level of the first metatarsal head. No radiopaque foreign body is identified. No findings suspicious for the septic arthritis or osteomyelitis. Moderate midfoot degenerative changes. There are also cystic changes in the distal calcaneus but no worrisome bone lesions. IMPRESSION: Diffuse cellulitis without discrete drainable soft tissue abscess or radiopaque foreign body. No definite CT findings for septic arthritis or osteomyelitis. Moderate degenerative changes. Electronically Signed   By: Rudie Meyer M.D.   On: 10/30/2017 15:49   Dg Chest Port 1 View  Result Date: 11/19/2017 CLINICAL DATA:  Post cardiac arrest. EXAM: PORTABLE CHEST 1 VIEW COMPARISON:  Frontal and lateral views yesterday. FINDINGS: Left-sided pacemaker remains in place. Cardiomegaly again seen. Unchanged  mediastinal contours. Increased vascular congestion and pulmonary edema. Small bilateral pleural effusions suspected. No pneumothorax. IMPRESSION: Increased vascular congestion and pulmonary edema. Similar cardiomegaly. Probable small pleural effusions. Electronically Signed   By: Rubye Oaks M.D.   On: 11/19/2017 04:15   Dg Chest Port 1 View  Result Date: 10/27/2017 CLINICAL DATA:  Shortness of breath, respiratory failure, CHF, coronary artery disease, sepsis. EXAM: PORTABLE CHEST 1 VIEW COMPARISON:  Portable chest x-ray of October 25, 2017 FINDINGS: The lungs are adequately inflated. The interstitial markings are increased though stable. The cardiac silhouette is enlarged. The pulmonary vascularity is mildly engorged. There is calcification in the wall of the aortic arch. The ICD is in stable position. The trachea is midline. IMPRESSION: CHF with mild interstitial edema. No alveolar pneumonia nor pleural effusion. Thoracic aortic atherosclerosis. Electronically Signed   By: Azaiah Licciardi  [place] M.D.   On: 10/27/2017 07:59   Dg Chest Port 1 View  Result Date: 10/25/2017 CLINICAL DATA:  77 year old male status post septic shock, intubated. EXAM: PORTABLE CHEST 1 VIEW COMPARISON:  10/24/2017 and earlier. FINDINGS: Portable  AP semi upright view at 0410 hours. Extubated. Enteric tube removed. Stable right IJ central line. Stable left chest cardiac AICD. Mildly improved lung volumes. Stable to mildly improved ventilation with continued blunting of both costophrenic angles. Stable pulmonary vascularity without overt edema. No pneumothorax. Paucity of bowel gas in the upper abdomen. IMPRESSION: 1. Extubated and enteric tube removed. 2. Mildly improved ventilation with probable small pleural effusions and basilar atelectasis. Electronically Signed   By: Odessa Fleming M.D.   On: 10/25/2017 06:48   Dg Chest Port 1 View  Result Date: 10/24/2017 CLINICAL DATA:  Followup intubated patient. EXAM: PORTABLE CHEST 1 VIEW  COMPARISON:  10/23/2017 FINDINGS: Lung volumes are low. There is hazy lung base opacity, left greater than right, likely combination of small effusions and atelectasis. No pulmonary edema. No pneumothorax. Endotracheal tube, nasal/ orogastric tube, right internal jugular central venous line and left anterior chest wall biventricular cardioverter-defibrillator are stable in well positioned. IMPRESSION: 1. No significant change from the most recent prior study allowing for lower lung volumes on the current exam. 2. Mild, left greater than right, lung base atelectasis with probable small effusions. 3. Support apparatus is well positioned. Electronically Signed   By: Amie Portland M.D.   On: 10/24/2017 07:43   Dg Chest Port 1 View  Result Date: 10/23/2017 CLINICAL DATA:  77 year old male status post intubation. EXAM: PORTABLE CHEST 1 VIEW COMPARISON:  Earlier chest radiograph dated 10/23/2017 FINDINGS: Endotracheal tube with tip above the carina in similar positioning. Right IJ central venous catheter as seen previously. Left lung base densities may represent atelectatic changes. Pneumonia is not excluded. Overall there has been interval increase in the densities at the left lung base compared to the earlier radiograph. A small left pleural effusion is not entirely excluded. There is no pneumothorax. Stable cardiomegaly. Left pectoral AICD device. No acute osseous pathology. IMPRESSION: 1. Endotracheal tube and right IJ central line in similar position. 2. Interval increase in the left lung base densities which may represent atelectatic changes, although infiltrate is not excluded. A small left pleural effusion may be present. Electronically Signed   By: Elgie Collard M.D.   On: 10/23/2017 06:53   Dg Abd Portable 1v  Result Date: 10/23/2017 CLINICAL DATA:  77 year old male with an orogastric tube in place. Assess tube location. EXAM: PORTABLE ABDOMEN - 1 VIEW COMPARISON:  Prior abdominal radiograph 10/06/2017  FINDINGS: A single radiograph of the upper abdomen is submitted for review. The orogastric tube is in good position projecting over the gastric body. Incompletely imaged biventricular cardiac rhythm maintenance device. The tip of a venous catheter overlies the mid right atrium. Mild patchy airspace opacity in the left lower lobe may reflect infiltrate or atelectasis. The visualized bowel gas pattern is not obstructed. IMPRESSION: 1. Well-positioned gastric tube projects over the gastric body. 2. Left basilar patchy airspace opacity may reflect atelectasis or infiltrate. Electronically Signed   By: Malachy Moan M.D.   On: 10/23/2017 08:35    Catarina Hartshorn, DO  [facility] Pager [phone]  If 7PM-7AM, please contact night-coverage [email or web address] Password [email or web address] 11/20/2017, 8:56 AM   LOS: 2 days

## 2017-11-21 LAB — GLUCOSE, CAPILLARY: Glucose-Capillary: 138 mg/dL — ABNORMAL HIGH (ref 65–99)

## 2017-11-22 ENCOUNTER — Other Ambulatory Visit: Payer: Self-pay | Admitting: Licensed Clinical Social Worker

## 2017-11-22 ENCOUNTER — Encounter: Payer: Self-pay | Admitting: Licensed Clinical Social Worker

## 2017-11-22 MED FILL — Medication: Qty: 1 | Status: AC

## 2017-11-22 NOTE — Patient Outreach (Signed)
Assessment:  CSW received communication from RN Irving Shows regarding client status and needs.  Client recently was admitted to [facility] in [place], [place].  Through CSW review of Epic record on 12-22-2017, CSW learned from Epic note that client died at [facility] in [place], [place] on 12-21-2017.  Thus, CSW is discharging client from [facility] CSW services on 12/22/2017 due to death of client at [facility] on 2017/12/21.   Plan:  CSW is discharging Bartlomiej Jenkinson from [facility] CSW services on 12-22-17 due to death of client on 12/21/2017.  CSW to inform Lucia Gaskins, Case Management Assistant, that CSW discharged client from [facility] CSW services on 12/22/2017.  CSW to fax physician case closure letter to Dr. [name] informing Dr. Sherryll Burger that CSW discharged client on 12-22-2017 from [facility] CSW services due to death of client on 12/21/17.   Kelton Pillar.Kenyetta Wimbish MSW, LCSW Licensed Clinical Social Worker [facility] Care Management [phone]

## 2017-11-24 ENCOUNTER — Ambulatory Visit (HOSPITAL_COMMUNITY): Payer: PPO

## 2017-11-24 ENCOUNTER — Encounter (HOSPITAL_COMMUNITY): Payer: PPO

## 2017-12-01 ENCOUNTER — Ambulatory Visit: Payer: Self-pay | Admitting: Licensed Clinical Social Worker

## 2017-12-07 ENCOUNTER — Ambulatory Visit: Payer: PPO | Admitting: Cardiology

## 2017-12-17 NOTE — Progress Notes (Signed)
Body released to [facility] and [facility].

## 2017-12-17 NOTE — Death Summary Note (Signed)
DEATH SUMMARY   Patient Details  Name: [name] MRN: [id number] DOB: [birth year]  Admission/Discharge Information   Admit Date:  12/14/17  Date of Death: Date of Death: 12-17-2017  Time of Death: Time of Death: 1747-03-29  Length of Stay: 3  Referring Physician: [name], MD   Reason(s) for Hospitalization  Dyspnea, weight gain   Diagnoses  Preliminary cause of death: Acute on chronic systolic and diastolic CHF Secondary Diagnoses (including complications and co-morbidities):  Acute on chronic systolic and diastolic CHF -11/06/2017 discharge weight 242 -10/13/2017 echo--EF 25-30, grade 1 DD, mild TR -lasix 80 mg IV tid -daily weights -u/o remains poor--only 150cc overnight -discussed with renal, Dr. Morene Crocker need to be initiated on CRRT -fluid restrict -holding hydralazine and isordil due to soft BP -holding coreg due to decompensation and soft BP -11/20/17--case discussed with cardiology, Dr. Di Kindle current amio, dobutamine, may need increase diuretics if ok with renal  Cardiac Arrest/PEA -occurred early am 11/19/17--in part due to hyperkalemia -ROSC with chest compressions only -cardiology follow up appreciated  Acute on chronic renal failure--CKD 4 -Baseline creatinine 2.0-2.4 -Presenting creatinine 3.73 -renal ultrasound--no hydronephrosis, atrophic left kidney, R-medical renal disease. -due to progression of renal disease with cardiorenal syndrome -renal following -discussed with renal, Dr. Morene Crocker need to be initiated on CRRT -Dr. Wolfgang Phoenix spoke with patient--pt ultimately decided against dialysis/CRRT  Hematochezia -pt had bloody BM 11/20/17--FFP ordered -GI consult requested-->not a candidate for aggressive measures  Acute respiratory failure with hypoxia -Presently stable on 3 L -Secondary to pulmonary edema -Wean oxygen for saturation greater than 92%  Transaminasemia -due to shock liver -trend  -no abdominal  pain  Hyperkalemia -s/p Kayexalate x 3-->improved -Secondary to potassium supplementation which has been discontinued and renal failure -Remain on telemetry  ICM -EF 25-30% on Nov. 2018 Echo -s/p AICD  Coagulopathy/Supratherapeutic INR -Hold warfarin -in part due to shock liver -Presenting INR 4.17 -Monitor for signs of bleeding -Dose of vitamin K 1/4-->repeat vitamin K 11/20/17 -Hgb remains stable  Persistent atrial fibrillation -Patient had GI bleed in November 2018 -Holding warfarin secondary to supratherapeutic INR -continue amiodarone drip per cardiology  Diabetes Mellitus type 2 -70/30 to 8 units bid -continue novolog SSI -09/28/2017 hemoglobin A1c 7.8  Hydrocele/Scrotal Edema -10/28/2017 scrotal ultrasound--right hydrocele, small left varicocele without torsion  Peripheral vascular disease -10/12/2017 arteriogram arthrectomy and angioplasty with left SFA and popliteal -continue Plavix    Brief Hospital Course (including significant findings, care, treatment, and services provided and events leading to death)  77 year old male with a history of ischemic cardiomyopathy, coronary disease, hypertension,persistentatrial fibrillation, recent NSTEMI 10/2017,CKD stage IV, peripheral vascular disease presenting with 15 pound weight gain and increasing shortness of breath. The patient also has been complaining of increasing lower extremity edema and increasing abdominal girth. He endorses compliance with his diuretics and fluid restriction. There is not been any fevers, chills, chest pain, nausea, vomiting, diarrhea, headache. The patient has had 2 recent prolonged hospitalizations. Most recently, the patient was hospitalized for 10/23/17 through 11/06/17. During that hospitalization, the patient was treated for sepsis although the source was unclear. He was discharged in stable condition without antibiotics. During that hospitalization he was also intubated for  respiratory failure secondary to acute on chronic systolic CHF and NSTEMI.Prior to that hospitalization, the patient was hospitalized from 09/27/17 through 10/15/17 once again for sepsis secondary to diabetic foot infection. The patient was also intubated for respiratory failure at that time. His hospital stay was complicated by a massive GI bleed. His warfarin  was stopped at the time of discharge. He underwent 2 EGDs which showed clotted blood in the gastric fundus and body.  On the morning of 11/19/2017,CODE BLUE was activated secondary to PEA. The patient underwent CPR for approximately 3 minutes with ROSC. No meds were given during PEA.  Cardiology was consulted to assist.  Echo showed decreased EF from 10/13/17 and new severe RV.  Pt was started on amiodarone drip and dobutamine.  Cardiology requested transfer to [facility] for [facility] team eval. Cardiology was consulted to assist with management.  Echocardiogram was ordered and showed decreased EF from 10/13/17 and new severe RV dysfunction which likely further supports cardiorenal syndrome and his worsening renal function.  Nephrology was consulted.  The patient continued to have poor urine output.  Nephrology recommended dialysis/CRRT.  Ultimately, the patient did not want to pursue dialysis.  He understood the risks and ramifications and alternatives. I discussed his overall poor prognosis with patient and wife in the setting of his current multisystem organ failure including renal, respiratory, hematologic, and hepatic failure. Patient then states, "I'm tired.  I don't want to be in pain and sufffer any more."  I discussed options with patient.  He has decided against any further aggressive measure and does not want to be transferred to [facility].  A mutual decision was made to transition the patient's focus of care to FULL COMFORT.        Pertinent Labs and Studies  Significant Diagnostic Studies Dg Chest 2 View  Result Date:  11/30/2017 CLINICAL DATA:  SOB, PT STATES HE JUST GOT OUT OF THE HOSPITAL 10-12 DAYS AGO AND HIS SOB AND FLUID RETENTION HAS CONTINUED TO WORSEN EXAM: CHEST  2 VIEW COMPARISON:  None. FINDINGS: Bilateral mild interstitial thickening. Small right pleural effusion. No pneumothorax. No focal consolidation. Stable cardiomegaly. Three lead cardiac pacemaker. No acute osseous abnormality. IMPRESSION: Findings concerning for mild CHF. Electronically Signed   By: Elige Ko   On: 12/05/2017 15:28   Nm Bone Scan 3 Phase  Result Date: 10/29/2017 CLINICAL DATA:  Left foot pain and swelling after plantar puncture wound approximately 2 months ago. Evaluate for osteomyelitis. Unable to undergo MRI. EXAM: NUCLEAR MEDICINE 3-PHASE BONE SCAN TECHNIQUE: Radionuclide angiographic images, immediate static blood pool images, and 3-hour delayed static images were obtained of the feet and distal lower legs after intravenous injection of radiopharmaceutical. RADIOPHARMACEUTICALS:  21.3 mCi Tc-24m MDP COMPARISON:  Left foot radiographs 10/03/2017.  CT 09/28/2017. FINDINGS: Vascular phase: There is asymmetrically increased perfusion to the left foot relative to the right. Blood pool phase: There is prominent asymmetric blood pool activity within the left forefoot medially. Delayed phase: Delayed images demonstrate focal activity within the bases of the first and second proximal phalanges, adjacent to the first web space. There is bilateral midfoot activity associated with the Lisfranc joints, likely arthropathic. IMPRESSION: 1. Abnormal three-phase bone scan with increased perfusion, blood pool and delayed phase activity in the left forefoot. This appears to localize to the first and second proximal phalangeal bases on the delayed images and is suspicious for osteomyelitis. Consider follow-up CT for confirmation. 2. Delayed phase activity at the Lisfranc joint bilaterally, likely arthropathic. Electronically Signed   By: Carey Bullocks M.D.   On: 10/29/2017 16:46   US Scrotum  Result Date: 10/28/2017 CLINICAL DATA:  Bilateral full swelling. EXAM: SCROTAL ULTRASOUND DOPPLER ULTRASOUND OF THE TESTICLES TECHNIQUE: Complete ultrasound examination of the testicles, epididymis, and other scrotal structures was performed. Color and spectral  Doppler ultrasound were also utilized to evaluate blood flow to the testicles. COMPARISON:  CT 06/13/2013. FINDINGS: Right testicle Measurements: 4.4 x 2.3 x 2.8 cm. No mass or microlithiasis visualized. Left testicle Measurements: 3.8 x 2.2 x 3.5 cm. No mass or microlithiasis visualized. Right epididymis:  Normal in size and appearance. Left epididymis:  Normal in size and appearance. Hydrocele:  Small right. Varicocele:  Small left. Pulsed Doppler interrogation of both testes demonstrates bilateral color Doppler flow. Diffuse scrotal wall thickening. No scrotal wall fluid collections are noted. IMPRESSION: 1. Diffuse scrotal wall thickening. 2. Small right hydrocele. Small left varicocele. No evidence of testicular mass or torsion . Electronically Signed   By: Maisie Fus  Register   On: 10/28/2017 15:42   US Renal  Result Date: 11/19/2017 CLINICAL DATA:  Acute renal failure, stage IV. EXAM: RENAL / URINARY TRACT ULTRASOUND COMPLETE COMPARISON:  Body CT 06/13/2013 FINDINGS: Right Kidney: Length: 10.3 cm. Moderate cortical thinning. No mass or hydronephrosis visualized. Left Kidney: Length: 4.4 cm.  Atrophic appearance of the kidney. Bladder: Poorly visualized as it is decompressed. IMPRESSION: Right renal cortical thinning, consistent with medical renal disease. Atrophic left kidney. Poor visualization of the urinary bladder. Electronically Signed   By: Ted Mcalpine M.D.   On: 11/19/2017 17:10   Ct Foot Left Wo Contrast  Result Date: 10/30/2017 CLINICAL DATA:  Diffuse soft tissue swelling. Stepped on a tack, now with subsequent infection. EXAM: CT OF THE LEFT FOOT WITHOUT CONTRAST TECHNIQUE:  Multidetector CT imaging of the left foot was performed according to the standard protocol. Multiplanar CT image reconstructions were also generated. COMPARISON:  Radiographs 10/03/2017 FINDINGS: Diffuse and fairly marked subcutaneous soft tissue swelling/ edema/ fluid consistent with cellulitis. No discrete fluid collection to suggest a drainable abscess. Diffuse fatty change involving the foot and ankle musculature but no definite findings for myofasciitis or pyomyositis. The On the sagittal sequence there is a small open wound noted along the plantar aspect of the forefoot at the level of the first metatarsal head. No radiopaque foreign body is identified. No findings suspicious for the septic arthritis or osteomyelitis. Moderate midfoot degenerative changes. There are also cystic changes in the distal calcaneus but no worrisome bone lesions. IMPRESSION: Diffuse cellulitis without discrete drainable soft tissue abscess or radiopaque foreign body. No definite CT findings for septic arthritis or osteomyelitis. Moderate degenerative changes. Electronically Signed   By: Rudie Meyer M.D.   On: 10/30/2017 15:49   Dg Chest Port 1 View  Result Date: 11/19/2017 CLINICAL DATA:  Post cardiac arrest. EXAM: PORTABLE CHEST 1 VIEW COMPARISON:  Frontal and lateral views yesterday. FINDINGS: Left-sided pacemaker remains in place. Cardiomegaly again seen. Unchanged mediastinal contours. Increased vascular congestion and pulmonary edema. Small bilateral pleural effusions suspected. No pneumothorax. IMPRESSION: Increased vascular congestion and pulmonary edema. Similar cardiomegaly. Probable small pleural effusions. Electronically Signed   By: Rubye Oaks M.D.   On: 11/19/2017 04:15   Dg Chest Port 1 View  Result Date: 10/27/2017 CLINICAL DATA:  Shortness of breath, respiratory failure, CHF, coronary artery disease, sepsis. EXAM: PORTABLE CHEST 1 VIEW COMPARISON:  Portable chest x-ray of October 25, 2017 FINDINGS:  The lungs are adequately inflated. The interstitial markings are increased though stable. The cardiac silhouette is enlarged. The pulmonary vascularity is mildly engorged. There is calcification in the wall of the aortic arch. The ICD is in stable position. The trachea is midline. IMPRESSION: CHF with mild interstitial edema. No alveolar pneumonia nor pleural effusion. Thoracic aortic atherosclerosis. Electronically Signed  By: Miarose Lippert  [place] M.D.   On: 10/27/2017 07:59   Dg Chest Port 1 View  Result Date: 10/25/2017 CLINICAL DATA:  77 year old male status post septic shock, intubated. EXAM: PORTABLE CHEST 1 VIEW COMPARISON:  10/24/2017 and earlier. FINDINGS: Portable AP semi upright view at 0410 hours. Extubated. Enteric tube removed. Stable right IJ central line. Stable left chest cardiac AICD. Mildly improved lung volumes. Stable to mildly improved ventilation with continued blunting of both costophrenic angles. Stable pulmonary vascularity without overt edema. No pneumothorax. Paucity of bowel gas in the upper abdomen. IMPRESSION: 1. Extubated and enteric tube removed. 2. Mildly improved ventilation with probable small pleural effusions and basilar atelectasis. Electronically Signed   By: Odessa Fleming M.D.   On: 10/25/2017 06:48   Dg Chest Port 1 View  Result Date: 10/24/2017 CLINICAL DATA:  Followup intubated patient. EXAM: PORTABLE CHEST 1 VIEW COMPARISON:  10/23/2017 FINDINGS: Lung volumes are low. There is hazy lung base opacity, left greater than right, likely combination of small effusions and atelectasis. No pulmonary edema. No pneumothorax. Endotracheal tube, nasal/ orogastric tube, right internal jugular central venous line and left anterior chest wall biventricular cardioverter-defibrillator are stable in well positioned. IMPRESSION: 1. No significant change from the most recent prior study allowing for lower lung volumes on the current exam. 2. Mild, left greater than right, lung base atelectasis  with probable small effusions. 3. Support apparatus is well positioned. Electronically Signed   By: Amie Portland M.D.   On: 10/24/2017 07:43   Dg Chest Port 1 View  Result Date: 10/23/2017 CLINICAL DATA:  77 year old male status post intubation. EXAM: PORTABLE CHEST 1 VIEW COMPARISON:  Earlier chest radiograph dated 10/23/2017 FINDINGS: Endotracheal tube with tip above the carina in similar positioning. Right IJ central venous catheter as seen previously. Left lung base densities may represent atelectatic changes. Pneumonia is not excluded. Overall there has been interval increase in the densities at the left lung base compared to the earlier radiograph. A small left pleural effusion is not entirely excluded. There is no pneumothorax. Stable cardiomegaly. Left pectoral AICD device. No acute osseous pathology. IMPRESSION: 1. Endotracheal tube and right IJ central line in similar position. 2. Interval increase in the left lung base densities which may represent atelectatic changes, although infiltrate is not excluded. A small left pleural effusion may be present. Electronically Signed   By: Elgie Collard M.D.   On: 10/23/2017 06:53   Dg Abd Portable 1v  Result Date: 10/23/2017 CLINICAL DATA:  77 year old male with an orogastric tube in place. Assess tube location. EXAM: PORTABLE ABDOMEN - 1 VIEW COMPARISON:  Prior abdominal radiograph 10/06/2017 FINDINGS: A single radiograph of the upper abdomen is submitted for review. The orogastric tube is in good position projecting over the gastric body. Incompletely imaged biventricular cardiac rhythm maintenance device. The tip of a venous catheter overlies the mid right atrium. Mild patchy airspace opacity in the left lower lobe may reflect infiltrate or atelectasis. The visualized bowel gas pattern is not obstructed. IMPRESSION: 1. Well-positioned gastric tube projects over the gastric body. 2. Left basilar patchy airspace opacity may reflect atelectasis or  infiltrate. Electronically Signed   By: Malachy Moan M.D.   On: 10/23/2017 08:35    Microbiology No results found for this or any previous visit (from the past 240 hour(s)).  Lab Basic Metabolic Panel: Recent Labs  Lab 11/19/17 0152 11/19/17 0953 11/19/17 1344 11/19/17 1721 11/20/17 0110  NA 129* 129* 128* 129* 129*  K 6.0* 5.8* 5.2*  5.0 4.4  CL 94* 96* 95* 94* 93*  CO2 18* 14* 19* 19* 18*  GLUCOSE 183* 202* 242* 234* 238*  BUN 127* 130* 136* 128* 131*  CREATININE 4.09* 4.05* 4.02* 4.17* 4.20*  CALCIUM 9.0 9.0 8.9 8.7* 8.5*  MG 2.3  --   --   --   --    Liver Function Tests: Recent Labs  Lab 11/19/17 0152 11/20/17 0110  AST 261* 306*  ALT 129* 211*  ALKPHOS 106 112  BILITOT 1.4* 0.7  PROT 7.3 6.5  ALBUMIN 2.9* 2.8*   No results for input(s): LIPASE, AMYLASE in the last 168 hours. No results for input(s): AMMONIA in the last 168 hours. CBC: Recent Labs  Lab 2017-11-30 1606 11/20/17 0109 11/20/17 1059  WBC 11.2* 10.7*  --   HGB 9.8* 9.2* 9.5*  HCT 31.5* 29.6* 30.8*  MCV 96.9 95.8  --   PLT 332 325  --    Cardiac Enzymes: Recent Labs  Lab 11/19/17 0152 11/19/17 0750 11/19/17 1344 11/19/17 1721 11/20/17 0109  TROPONINI 0.03* 0.04* 0.07* 0.08* 0.09*   Sepsis Labs: Recent Labs  Lab 2017-11-30 1606 11/20/17 0109  WBC 11.2* 10.7*    Procedures/Operations     Avis Tirone 12/15/2017, 6:07 PM

## 2017-12-17 DEATH — deceased

## 2017-12-21 ENCOUNTER — Ambulatory Visit: Payer: PPO | Admitting: Cardiology

## 2018-06-17 ENCOUNTER — Encounter: Payer: PPO | Admitting: Internal Medicine

## 2019-02-25 IMAGING — DX DG FOOT COMPLETE 3+V*L*
3 series · 3 of 3 positions shown · non-contrast
Comparison: None.

CLINICAL DATA: Pain after stepping on nail

EXAM:
LEFT FOOT - COMPLETE 3+ VIEW

[foot ap]
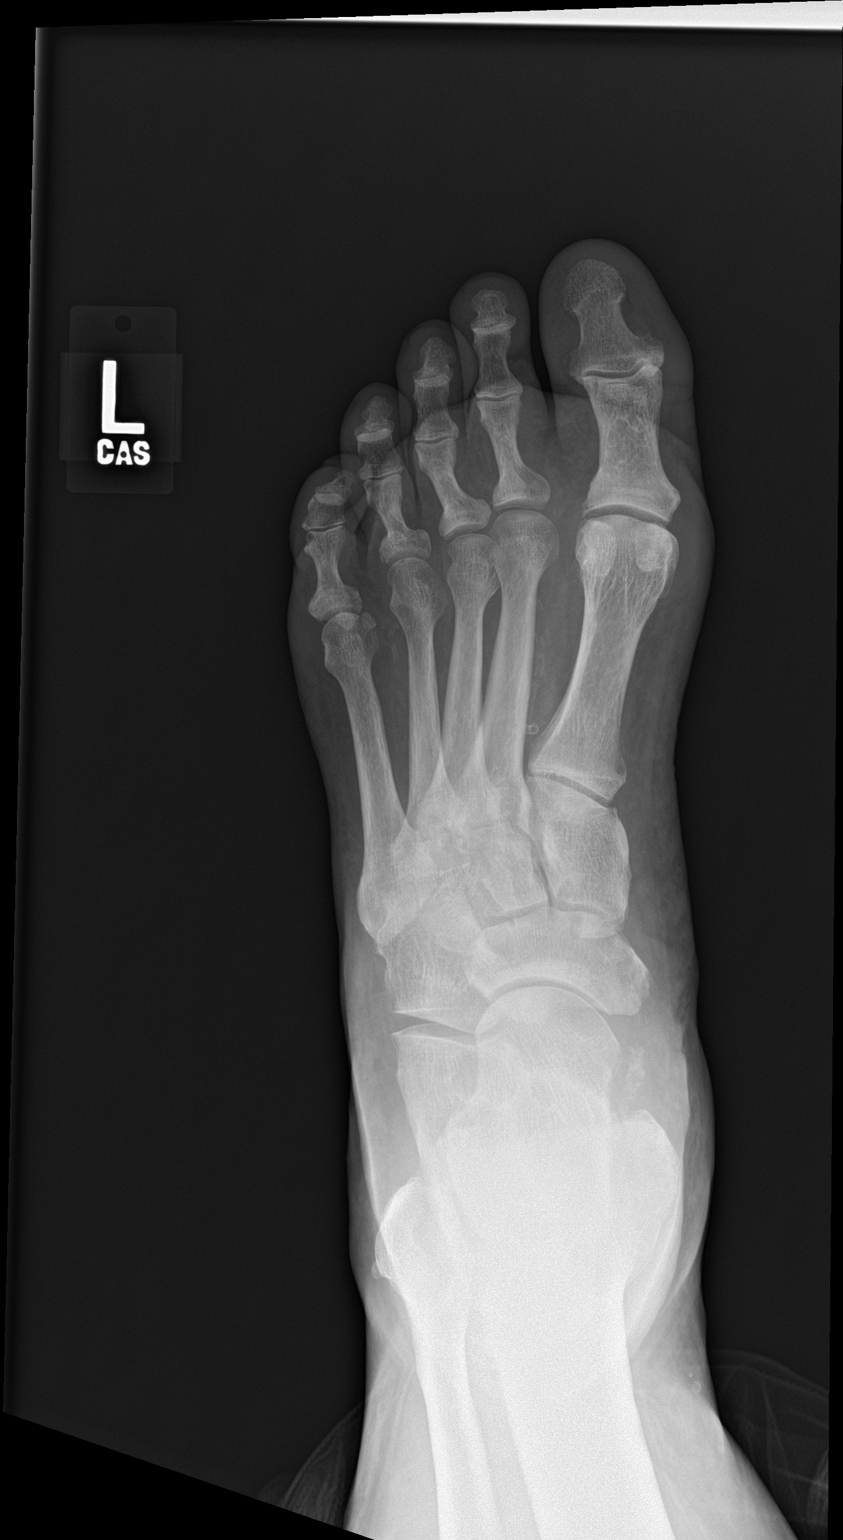

[foot obl]
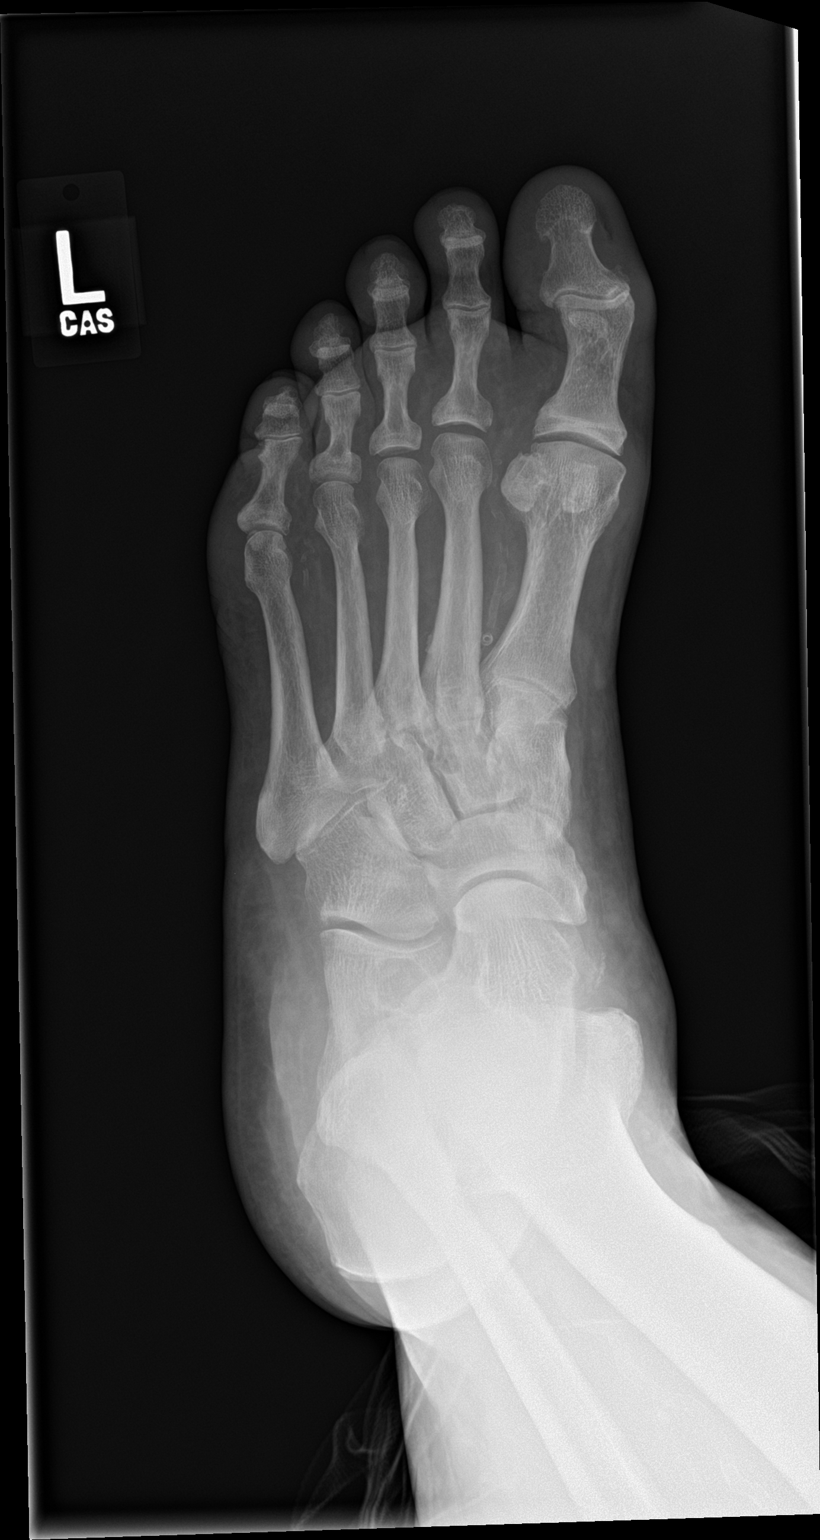

[foot lat]
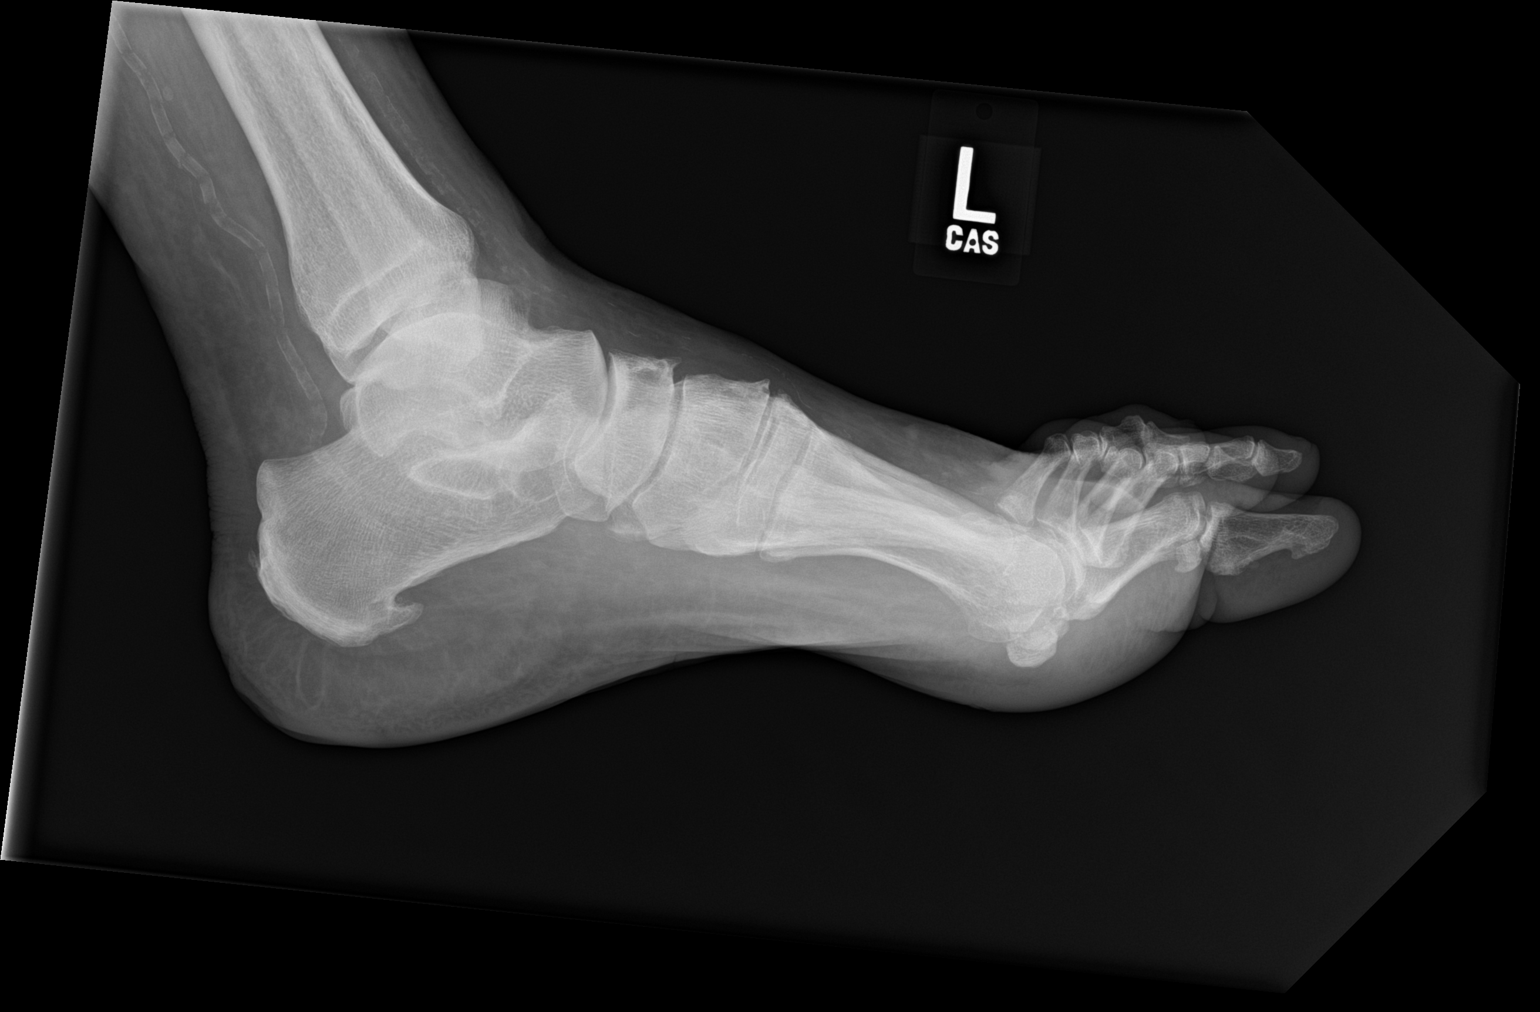

[3 of 3 positions shown; findings below may reference images not displayed]

FINDINGS: Frontal, oblique, and lateral views were obtained. No radiopaque
foreign body evident. No soft tissue air evident.

No fracture or dislocation. No erosive change or bony destruction.
There is mild narrowing at the first MTP joint. There is spurring in
the dorsal midfoot. There are spurs arising from the posterior and
inferior calcaneus.

There are foci of arterial vascular calcification throughout the
foot and ankle regions.
IMPRESSION: No radiopaque foreign body. No bony destruction or erosion. No
fracture or dislocation.

Spurring dorsal midfoot. Mild narrowing first MTP joint. Calcaneal
spurs noted.

Multiple foci of arterial vascular calcification noted.

## 2019-03-09 IMAGING — DX DG ABD PORTABLE 1V
1 series · 1 of 1 positions shown · non-contrast
Comparison: Abdominal CT 06/13/2013

CLINICAL DATA: Nasogastric tube placement

EXAM:
PORTABLE ABDOMEN - 1 VIEW

[abdomen kub]
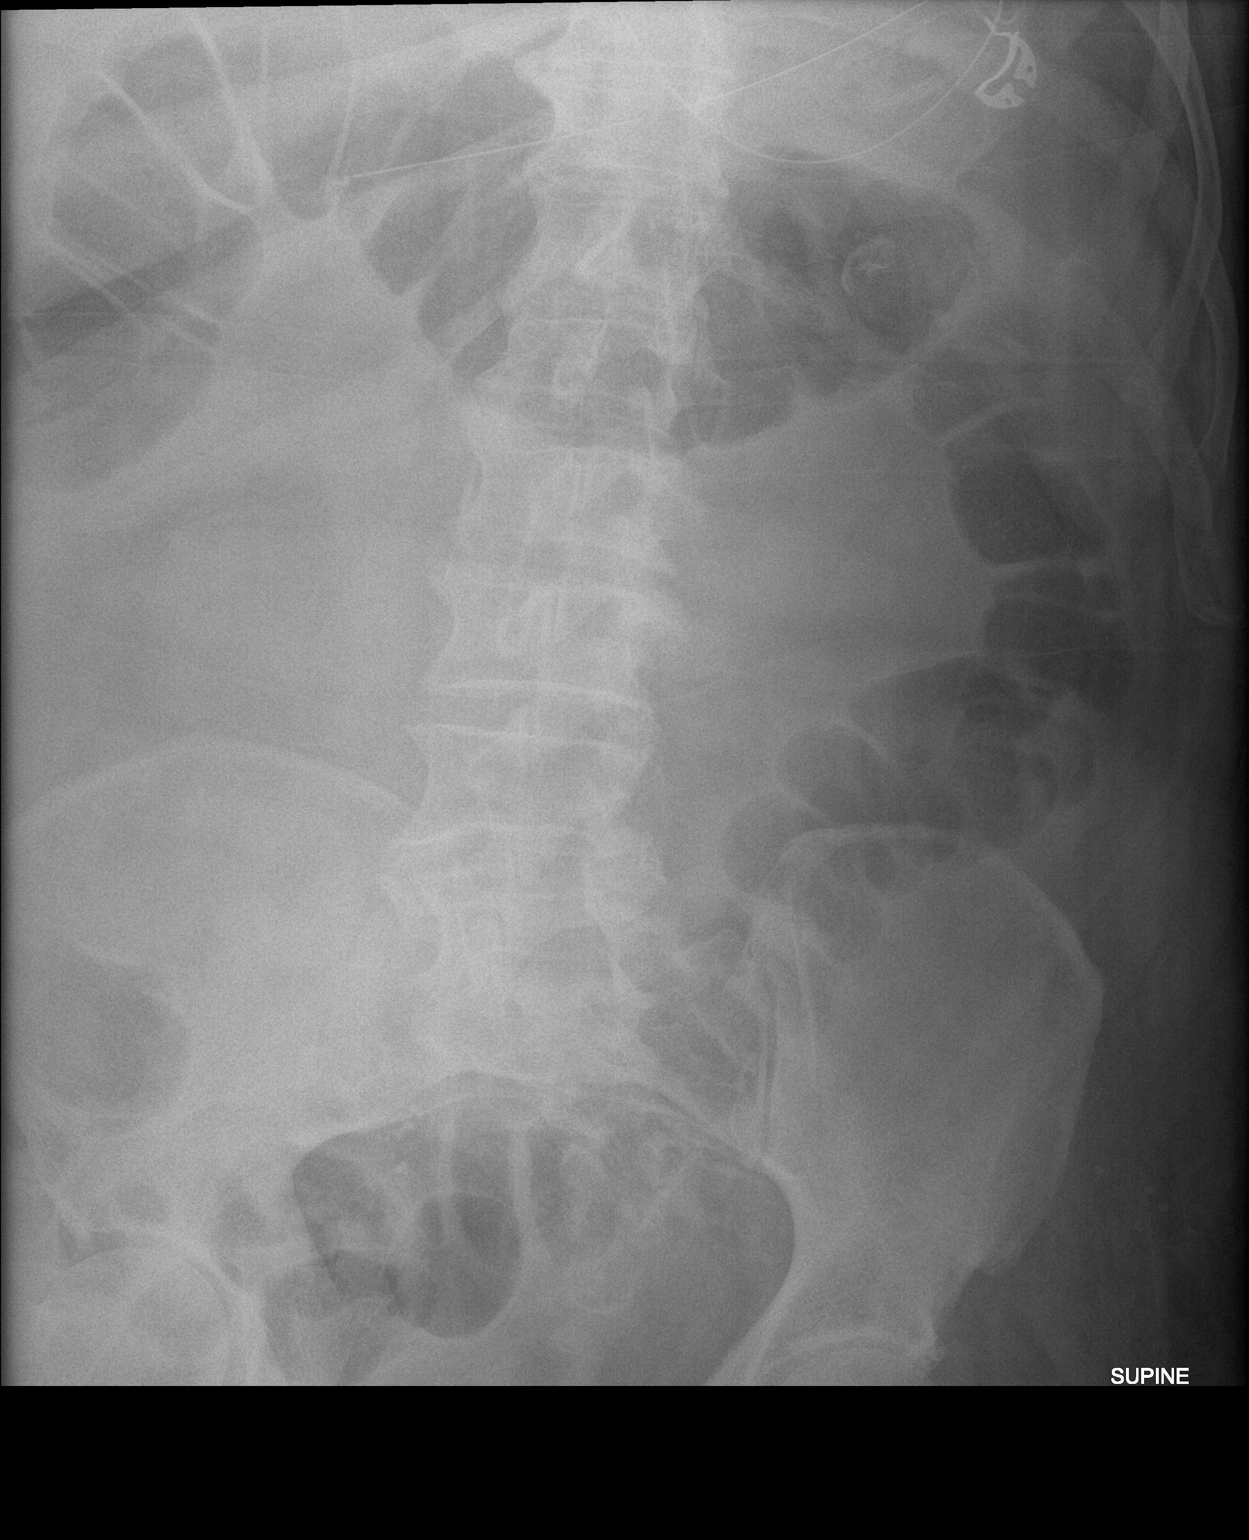

[1 of 1 positions shown; findings below may reference images not displayed]

FINDINGS: Nasogastric tube loops through the stomach with tip in the peri
pyloric region. Pelvic thermistor. Nonobstructive bowel gas pattern.
Gas is seen along distended but nondilated colon.
IMPRESSION: Peripyloric nasogastric tube tip.

## 2019-03-09 IMAGING — DX DG CHEST 1V PORT
1 series · 2 of 2 positions shown · non-contrast
Comparison: October 05, 2017

CLINICAL DATA: Respiratory failure/hypoxia

EXAM:
PORTABLE CHEST 1 VIEW

[Series 1: chest ap · 0.14mm/px · 2 of 2 slices shown]
[im 1/2]
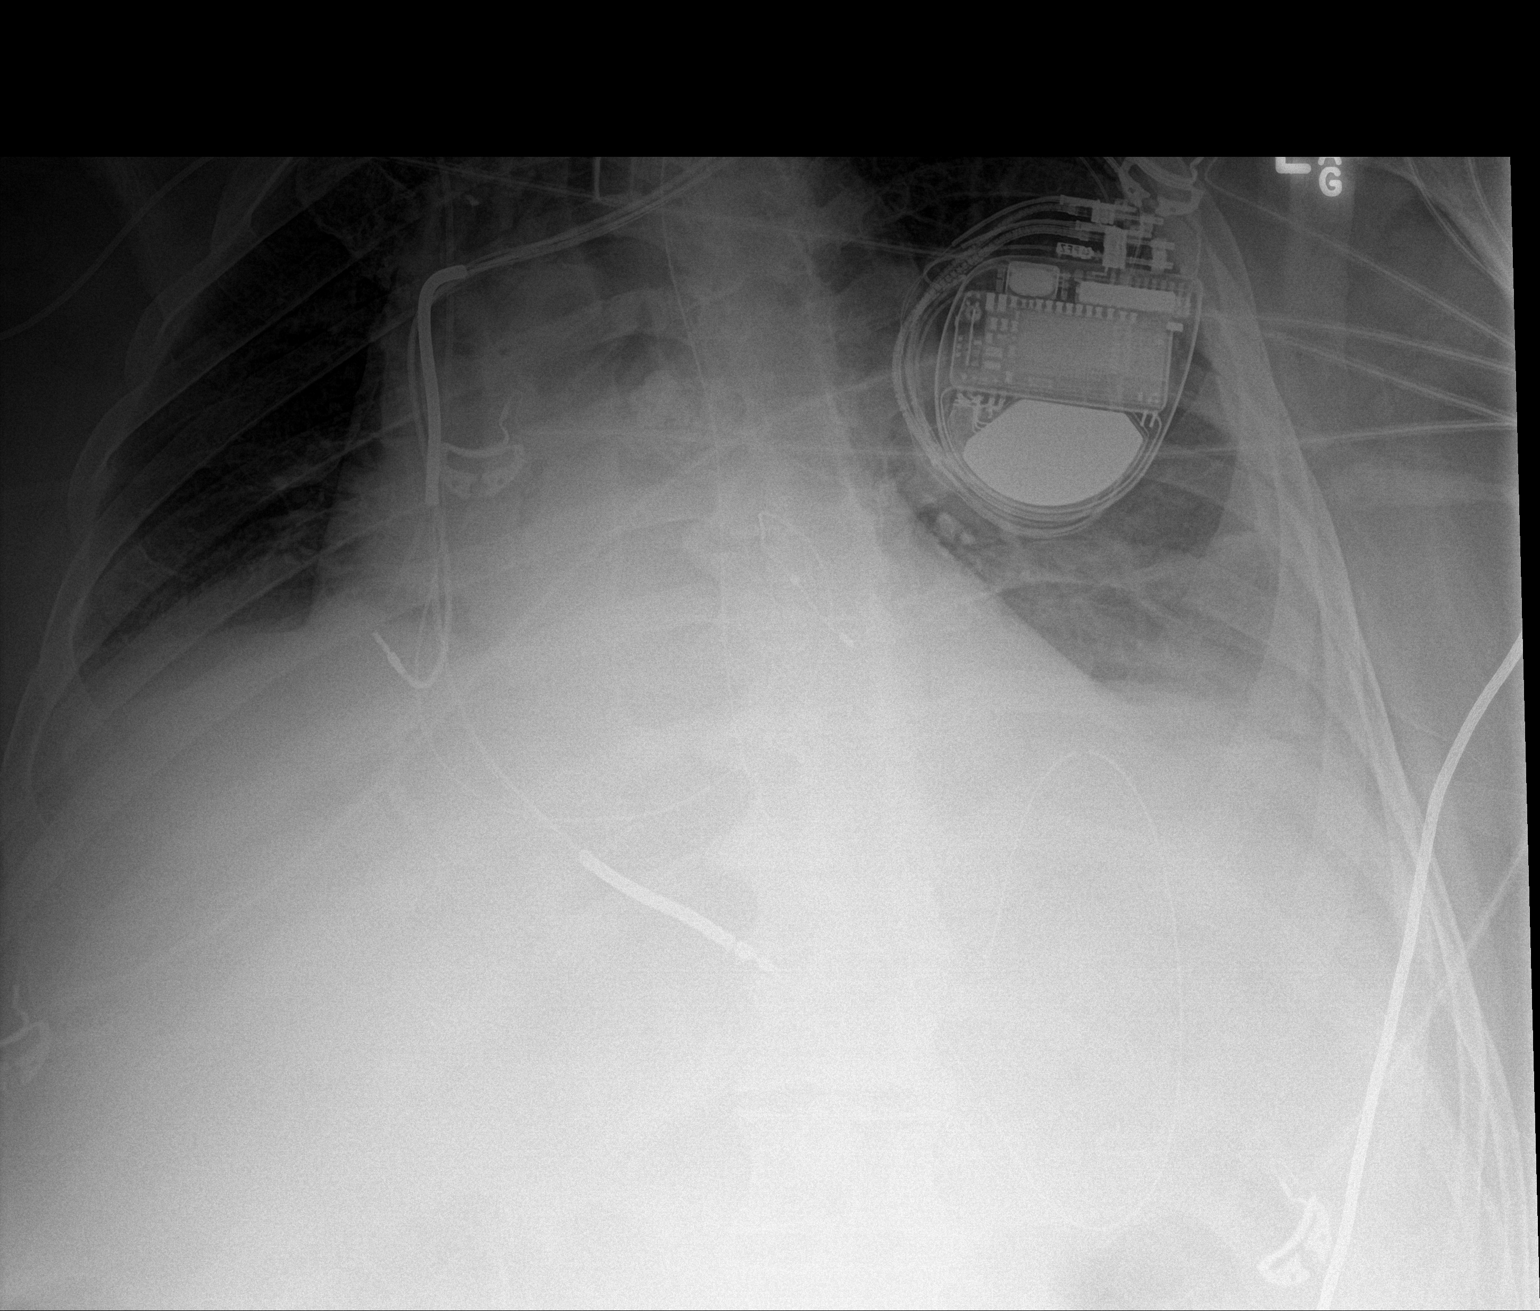
[im 2/2]
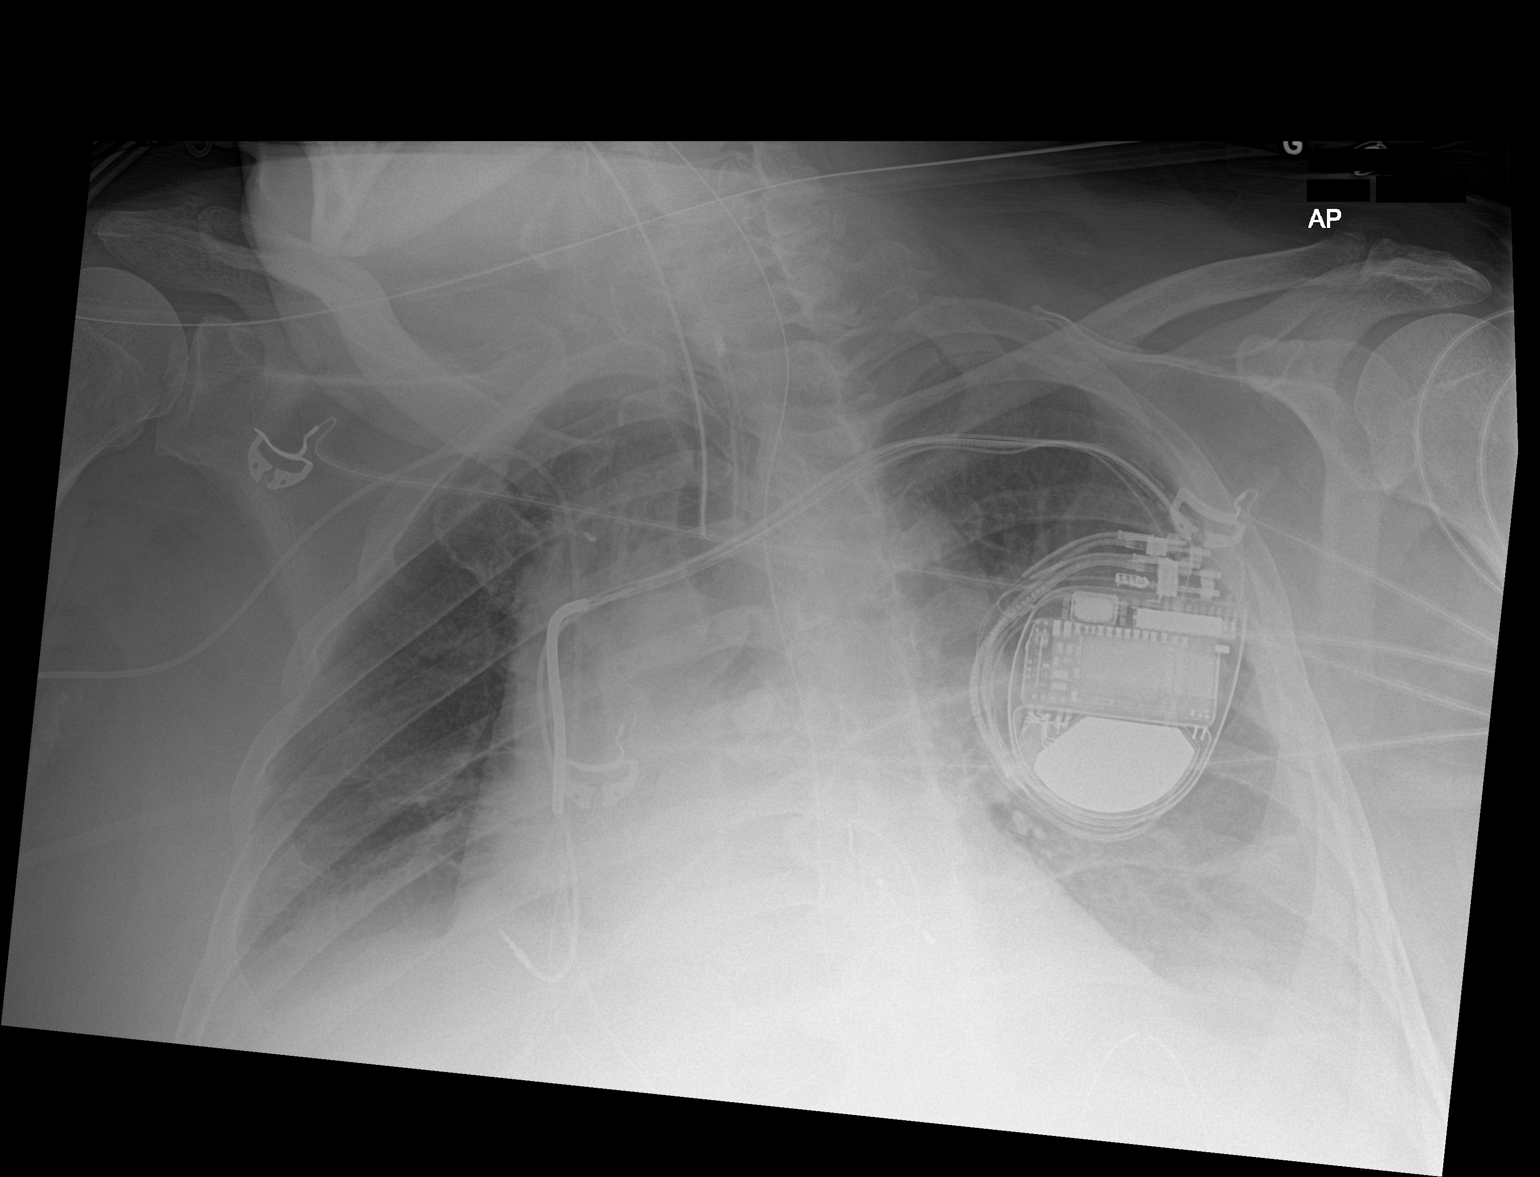

[2 of 2 positions shown; findings below may reference images not displayed]

FINDINGS: Endotracheal tube tip is 4.3 cm above the carina. Central catheter
tip is at the cavoatrial junction. Nasogastric tube tip and side
port are in the stomach. Pacemaker leads are unchanged. No
pneumothorax evident.

There are bilateral pleural effusions with patchy airspace
consolidation in the bases. There is cardiomegaly with pulmonary
vascularity within normal limits. No adenopathy. No bone lesions.
IMPRESSION: Tube and catheter positions as described without pneumothorax. Small
pleural effusions noted bilaterally. Airspace opacity in the bases
is likely primarily due to atelectasis, although pneumonia in the
bases superimposed cannot be excluded. Cardiomegaly is stable.
